# Patient Record
Sex: Male | Born: 1935
Health system: Southern US, Community
[De-identification: ages and names within clinical notes are randomized; demographics above are authoritative.]

## PROBLEM LIST (undated history)

## (undated) DIAGNOSIS — T8859XA Other complications of anesthesia, initial encounter: Secondary | ICD-10-CM

## (undated) DIAGNOSIS — I2510 Atherosclerotic heart disease of native coronary artery without angina pectoris: Secondary | ICD-10-CM

## (undated) DIAGNOSIS — I509 Heart failure, unspecified: Secondary | ICD-10-CM

## (undated) DIAGNOSIS — J439 Emphysema, unspecified: Secondary | ICD-10-CM

## (undated) DIAGNOSIS — T4145XA Adverse effect of unspecified anesthetic, initial encounter: Secondary | ICD-10-CM

## (undated) DIAGNOSIS — E209 Hypoparathyroidism, unspecified: Secondary | ICD-10-CM

## (undated) DIAGNOSIS — N400 Enlarged prostate without lower urinary tract symptoms: Secondary | ICD-10-CM

## (undated) DIAGNOSIS — I724 Aneurysm of artery of lower extremity: Secondary | ICD-10-CM

## (undated) DIAGNOSIS — I10 Essential (primary) hypertension: Secondary | ICD-10-CM

## (undated) DIAGNOSIS — I472 Ventricular tachycardia: Secondary | ICD-10-CM

## (undated) DIAGNOSIS — E785 Hyperlipidemia, unspecified: Secondary | ICD-10-CM

## (undated) DIAGNOSIS — J189 Pneumonia, unspecified organism: Secondary | ICD-10-CM

## (undated) DIAGNOSIS — M5430 Sciatica, unspecified side: Secondary | ICD-10-CM

## (undated) DIAGNOSIS — Z9989 Dependence on other enabling machines and devices: Secondary | ICD-10-CM

## (undated) DIAGNOSIS — G4733 Obstructive sleep apnea (adult) (pediatric): Secondary | ICD-10-CM

## (undated) DIAGNOSIS — I4729 Other ventricular tachycardia: Secondary | ICD-10-CM

## (undated) DIAGNOSIS — I219 Acute myocardial infarction, unspecified: Secondary | ICD-10-CM

## (undated) HISTORY — DX: Heart failure, unspecified: I50.9

## (undated) HISTORY — DX: Hyperlipidemia, unspecified: E78.5

## (undated) HISTORY — DX: Sciatica, unspecified side: M54.30

## (undated) HISTORY — DX: Acute myocardial infarction, unspecified: I21.9

## (undated) HISTORY — DX: Hypoparathyroidism, unspecified: E20.9

## (undated) HISTORY — PX: COLONOSCOPY: SHX174

## (undated) HISTORY — DX: Benign prostatic hyperplasia without lower urinary tract symptoms: N40.0

## (undated) HISTORY — PX: CATARACT EXTRACTION, BILATERAL: SHX1313

---

## 1992-02-08 DIAGNOSIS — I219 Acute myocardial infarction, unspecified: Secondary | ICD-10-CM

## 1992-02-08 HISTORY — DX: Acute myocardial infarction, unspecified: I21.9

## 1992-03-23 HISTORY — PX: CORONARY ARTERY BYPASS GRAFT: SHX141

## 2006-11-14 ENCOUNTER — Ambulatory Visit: Payer: Self-pay | Admitting: Family Medicine

## 2006-11-14 DIAGNOSIS — I10 Essential (primary) hypertension: Secondary | ICD-10-CM

## 2006-11-14 DIAGNOSIS — E785 Hyperlipidemia, unspecified: Secondary | ICD-10-CM

## 2006-11-15 ENCOUNTER — Encounter: Payer: Self-pay | Admitting: Family Medicine

## 2006-11-15 LAB — CONVERTED CEMR LAB
ALT: 23 units/L (ref 0–53)
AST: 21 units/L (ref 0–37)
Alkaline Phosphatase: 43 units/L (ref 39–117)
Calcium: 9.6 mg/dL (ref 8.4–10.5)
Chloride: 114 meq/L — ABNORMAL HIGH (ref 96–112)
Cholesterol, target level: 200 mg/dL
Cholesterol: 141 mg/dL (ref 0–200)
HDL goal, serum: 40 mg/dL
HDL: 39 mg/dL — ABNORMAL LOW (ref >39)
Lymphocytes Relative: 25 % (ref 12–46)
MCHC: 32.6 g/dL (ref 30.0–36.0)
MCV: 90.6 fL (ref 78.0–100.0)
Neutro Abs: 5.4 10*3/uL (ref 1.7–7.7)
Sodium: 142 meq/L (ref 135–145)
TSH: 1.534 microintl units/mL (ref 0.350–5.50)
Total CHOL/HDL Ratio: 3.6
Vitamin B-12: 279 pg/mL (ref 211–911)
WBC: 8.2 10*3/uL (ref 4.0–10.5)

## 2007-01-08 ENCOUNTER — Encounter: Payer: Self-pay | Admitting: Family Medicine

## 2007-01-25 ENCOUNTER — Encounter: Payer: Self-pay | Admitting: Family Medicine

## 2007-03-19 ENCOUNTER — Ambulatory Visit: Payer: Self-pay | Admitting: Family Medicine

## 2007-03-20 LAB — CONVERTED CEMR LAB
AST: 19 units/L (ref 0–37)
BUN: 14 mg/dL (ref 6–23)
CO2: 24 meq/L (ref 19–32)
Calcium: 9.6 mg/dL (ref 8.4–10.5)
Chloride: 108 meq/L (ref 96–112)
Cholesterol: 121 mg/dL (ref 0–200)
LDL Cholesterol: 59 mg/dL (ref 0–99)
Potassium: 4.7 meq/L (ref 3.5–5.3)
Sodium: 142 meq/L (ref 135–145)
Total Bilirubin: 0.6 mg/dL (ref 0.3–1.2)
Total CHOL/HDL Ratio: 2.8
VLDL: 18 mg/dL (ref 0–40)

## 2007-04-16 ENCOUNTER — Ambulatory Visit: Payer: Self-pay | Admitting: Family Medicine

## 2007-04-16 DIAGNOSIS — E348 Other specified endocrine disorders: Secondary | ICD-10-CM | POA: Insufficient documentation

## 2007-05-01 ENCOUNTER — Telehealth: Payer: Self-pay | Admitting: Family Medicine

## 2007-07-16 ENCOUNTER — Ambulatory Visit: Payer: Self-pay | Admitting: Family Medicine

## 2007-10-08 ENCOUNTER — Encounter: Payer: Self-pay | Admitting: Family Medicine

## 2007-10-09 LAB — CONVERTED CEMR LAB
Albumin: 4.4 g/dL (ref 3.5–5.2)
Alkaline Phosphatase: 30 units/L — ABNORMAL LOW (ref 39–117)
Glucose, Bld: 110 mg/dL — ABNORMAL HIGH (ref 70–99)
Sodium: 141 meq/L (ref 135–145)
Total Protein: 6.6 g/dL (ref 6.0–8.3)

## 2007-11-07 ENCOUNTER — Ambulatory Visit: Payer: Self-pay | Admitting: Family Medicine

## 2007-12-04 ENCOUNTER — Ambulatory Visit: Payer: Self-pay | Admitting: Family Medicine

## 2007-12-17 ENCOUNTER — Telehealth: Payer: Self-pay | Admitting: Family Medicine

## 2008-01-14 ENCOUNTER — Encounter: Payer: Self-pay | Admitting: Family Medicine

## 2008-01-14 ENCOUNTER — Ambulatory Visit: Payer: Self-pay | Admitting: Family Medicine

## 2008-01-14 LAB — CONVERTED CEMR LAB: Hgb A1c MFr Bld: 5.7 %

## 2008-01-15 LAB — CONVERTED CEMR LAB
Cholesterol: 125 mg/dL (ref 0–200)
HDL: 36 mg/dL — ABNORMAL LOW (ref >39)
Triglycerides: 131 mg/dL (ref <150)
VLDL: 26 mg/dL (ref 0–40)

## 2008-04-07 ENCOUNTER — Ambulatory Visit: Payer: Self-pay | Admitting: Family Medicine

## 2008-05-14 ENCOUNTER — Telehealth: Payer: Self-pay | Admitting: Family Medicine

## 2008-10-08 ENCOUNTER — Ambulatory Visit: Payer: Self-pay | Admitting: Family Medicine

## 2008-10-09 LAB — CONVERTED CEMR LAB
ALT: 23 units/L (ref 0–53)
AST: 24 units/L (ref 0–37)
Albumin: 4.4 g/dL (ref 3.5–5.2)
CO2: 26 meq/L (ref 19–32)
Glucose, Bld: 111 mg/dL — ABNORMAL HIGH (ref 70–99)
Potassium: 4.2 meq/L (ref 3.5–5.3)
Sodium: 142 meq/L (ref 135–145)
Total Bilirubin: 0.5 mg/dL (ref 0.3–1.2)

## 2008-11-27 ENCOUNTER — Ambulatory Visit (HOSPITAL_COMMUNITY): Admission: RE | Admit: 2008-11-27 | Discharge: 2008-11-28 | Payer: Self-pay | Admitting: Ophthalmology

## 2009-01-07 ENCOUNTER — Ambulatory Visit: Payer: Self-pay | Admitting: Family Medicine

## 2009-01-08 LAB — CONVERTED CEMR LAB
ALT: 23 units/L (ref 0–53)
AST: 24 units/L (ref 0–37)
BUN: 24 mg/dL — ABNORMAL HIGH (ref 6–23)
HDL: 36 mg/dL — ABNORMAL LOW (ref >39)
LDL Cholesterol: 75 mg/dL (ref 0–99)
Total Bilirubin: 0.5 mg/dL (ref 0.3–1.2)
Total CHOL/HDL Ratio: 4.2
Total Protein: 6.7 g/dL (ref 6.0–8.3)
VLDL: 40 mg/dL (ref 0–40)

## 2009-01-09 ENCOUNTER — Encounter: Payer: Self-pay | Admitting: Family Medicine

## 2009-03-09 ENCOUNTER — Encounter: Payer: Self-pay | Admitting: Family Medicine

## 2009-04-27 ENCOUNTER — Encounter: Payer: Self-pay | Admitting: Family Medicine

## 2009-05-07 ENCOUNTER — Ambulatory Visit: Payer: Self-pay | Admitting: Family Medicine

## 2009-05-08 LAB — CONVERTED CEMR LAB
AST: 17 units/L (ref 0–37)
Albumin: 4.4 g/dL (ref 3.5–5.2)
BUN: 24 mg/dL — ABNORMAL HIGH (ref 6–23)
CO2: 27 meq/L (ref 19–32)
Calcium: 9.9 mg/dL (ref 8.4–10.5)
Glucose, Bld: 113 mg/dL — ABNORMAL HIGH (ref 70–99)
PSA: 3.47 ng/mL (ref 0.10–4.00)

## 2009-10-29 ENCOUNTER — Telehealth (INDEPENDENT_AMBULATORY_CARE_PROVIDER_SITE_OTHER): Payer: Self-pay | Admitting: *Deleted

## 2009-11-02 ENCOUNTER — Telehealth: Payer: Self-pay | Admitting: Family Medicine

## 2009-11-10 ENCOUNTER — Ambulatory Visit: Payer: Self-pay | Admitting: Family Medicine

## 2009-11-10 DIAGNOSIS — M5430 Sciatica, unspecified side: Secondary | ICD-10-CM

## 2009-11-10 LAB — CONVERTED CEMR LAB
Blood Glucose, AC Bkfst: 97 mg/dL
Triglycerides: 206 mg/dL — ABNORMAL HIGH (ref <150)

## 2009-12-11 ENCOUNTER — Ambulatory Visit: Payer: Self-pay | Admitting: Family Medicine

## 2010-02-07 DIAGNOSIS — M5430 Sciatica, unspecified side: Secondary | ICD-10-CM

## 2010-02-07 HISTORY — DX: Sciatica, unspecified side: M54.30

## 2010-02-28 ENCOUNTER — Encounter: Payer: Self-pay | Admitting: Family Medicine

## 2010-03-09 NOTE — Assessment & Plan Note (Signed)
Summary: Sinusitis, HTN   Vital Signs:  Patient profile:   77 year old male Height:      68.75 inches Weight:      226 pounds Pulse rate:   61 / minute BP sitting:   138 / 81  (left arm) Cuff size:   regular  Vitals Entered By: Kathlene November (May 07, 2009 8:32 AM) CC: follow-up hypertension, Hypertension Management   Primary Care Provider:  Linford Arnold, C  CC:  follow-up hypertension and Hypertension Management.  History of Present Illness: follow-up hypertension.    Sinus congetion for about 1.5 months. Persistant bilat HA with lots of pressure.  Has tired several OTC products adn none have improved his sxs.  Has tried allergy meds. No fever.  Cough at night when lays flat and gets the drainage. No GI sxs.  When bends over nose is runny.   Hypertension History:      He complains of headache, but denies chest pain, palpitations, dyspnea with exertion, orthopnea, PND, peripheral edema, visual symptoms, neurologic problems, syncope, and side effects from treatment.  He notes no problems with any antihypertensive medication side effects.        Positive major cardiovascular risk factors include male age 63 years old or older, hyperlipidemia, hypertension, family history for ischemic heart disease (males less than 32 years old), and current tobacco user.  Negative major cardiovascular risk factors include no history of diabetes.        Positive history for target organ damage include ASHD (either angina/prior MI/prior CABG).  Further assessment for target organ damage reveals no history of stroke/TIA or peripheral vascular disease.     Current Medications (verified): 1)  Lipitor 40 Mg  Tabs (Atorvastatin Calcium) .... Take 1 Tablet By Mouth Once A Day At Bedtime 2)  Losartan Potassium-Hctz 100-25 Mg Tabs (Losartan Potassium-Hctz) .... Take 1 Tablet By Mouth Once A Day 3)  Tricor 145 Mg  Tabs (Fenofibrate) .... Take 1 Tablet By Mouth Once A Day 4)  Aspir-81 81 Mg  Tbec (Aspirin) .... Take  1 Tablet By Mouth Once A Day 5)  Metoprolol Tartrate 100 Mg Tabs (Metoprolol Tartrate) .... Take 1 Tablet By Mouth Two Times A Day  Allergies (verified): No Known Drug Allergies  Comments:  Nurse/Medical Assistant: The patient's medications and allergies were reviewed with the patient and were updated in the Medication and Allergy Lists. Kathlene November (May 07, 2009 8:32 AM)  Physical Exam  General:  Well-developed,well-nourished,in no acute distress; alert,appropriate and cooperative throughout examination Head:  Normocephalic and atraumatic without obvious abnormalities. No apparent alopecia or balding. TEnder over maxillary sinuses.  Eyes:  No corneal or conjunctival inflammation noted. EOMI. Perrla.  Ears:  External ear exam shows no significant lesions or deformities.  Otoscopic examination reveals clear canals, tympanic membranes are intact bilaterally without bulging, retraction, inflammation or discharge. Hearing is grossly normal bilaterally. Nose:  External nasal examination shows no deformity or inflammation.  Mouth:  Oral mucosa and oropharynx without lesions or exudates.  Teeth in good repair. Neck:  No deformities, masses, or tenderness noted. Lungs:  Normal respiratory effort, chest expands symmetrically. Lungs are clear to auscultation, no crackles or wheezes. Heart:  Normal rate and regular rhythm. S1 and S2 normal without gallop, murmur, click, rub or other extra sounds. Pulses:  Radial 2+  Skin:  no rashes.   Cervical Nodes:  No lymphadenopathy noted Psych:  Cognition and judgment appear intact. Alert and cooperative with normal attention span and concentration. No apparent delusions,  illusions, hallucinations   Impression & Recommendations:  Problem # 1:  HYPERTENSION, BENIGN ESSENTIAL (ICD-401.1) Looks better today and hasn't even taken his meds yet, since was fasting. F/U in 6 months. Due to recheck labs.   His updated medication list for this problem includes:     Losartan Potassium-hctz 100-25 Mg Tabs (Losartan potassium-hctz) .[name]... Take 1 tablet by mouth once a day    Metoprolol Tartrate 100 Mg Tabs (Metoprolol tartrate) .[name]... Take 1 tablet by mouth two times a day  BP today: 138/81 Prior BP: 143/74 (01/07/2009)  Prior 10 Yr Risk Heart Disease: N/A (11/15/2006)  Labs Reviewed: K+: 4.4 (01/07/2009) Creat: : 1.17 (01/07/2009)   Chol: 151 (01/07/2009)   HDL: 36 (01/07/2009)   LDL: 75 (01/07/2009)   TG: 202 (01/07/2009)  Orders: T-Comprehensive Metabolic Panel ([id number])  Problem # 2:  SINUSITIS - ACUTE-NOS (ICD-461.9)  His updated medication list for this problem includes:    Amoxicillin 500 Mg Cap (Amoxicillin) .[name]... Take 1 capsule by mouth three times a day x 10 days  Instructed on treatment. Call if symptoms persist or worsen.   Problem # 3:  SPECIAL SCREENING MALIGNANT NEOPLASM OF PROSTATE (ICD-V76.44) Will recheck PSA as was elevated in December but came back down a little in Jan.   Also due for DEXA.  Orders: T-PSA ([id number])  Complete Medication List: 1)  Lipitor 40 Mg Tabs (Atorvastatin calcium) .... Take 1 tablet by mouth once a day at bedtime 2)  Losartan Potassium-hctz 100-25 Mg Tabs (Losartan potassium-hctz) .... Take 1 tablet by mouth once a day 3)  Tricor 145 Mg Tabs (Fenofibrate) .... Take 1 tablet by mouth once a day 4)  Aspir-81 81 Mg Tbec (Aspirin) .... Take 1 tablet by mouth once a day 5)  Metoprolol Tartrate 100 Mg Tabs (Metoprolol tartrate) .... Take 1 tablet by mouth two times a day 6)  Amoxicillin 500 Mg Cap (Amoxicillin) .... Take 1 capsule by mouth three times a day x 10 days  Other Orders: T-Dual DXA Bone Density/ Axial (29562) T-Triglycerides ([id number])  Hypertension Assessment/Plan:      The patient's hypertensive risk group is category C: Target organ damage and/or diabetes.  Today's blood pressure is 138/81.  His blood pressure goal is < 140/90.  Patient Instructions: 1)  Please schedule a  follow-up appointment in 6 months .  Prescriptions: AMOXICILLIN 500 MG CAP (AMOXICILLIN) Take 1 capsule by mouth three times a day X 10 days  #30 x 0   Entered and Authorized by:   Nani Gasser MD   Signed by:   Nani Gasser MD on 05/07/2009   Method used:   Electronically to        [facility] [phone]* (retail)       [address]       [place], [place]  [address]       Ph: [phone]       Fax: [phone]   RxID:   [id number] LOSARTAN POTASSIUM-HCTZ 100-25 MG TABS (LOSARTAN POTASSIUM-HCTZ) Take 1 tablet by mouth once a day  #90 x 2   Entered and Authorized by:   Nani Gasser MD   Signed by:   Nani Gasser MD on 05/07/2009   Method used:   Electronically to        [facility] [phone]* (retail)       [address]       [place], [place]  [address]       Ph: [phone]  Fax: [phone]   RxID:   [id number]

## 2010-03-09 NOTE — Progress Notes (Signed)
Summary: diagnosis  Phone Note From Other Clinic   Caller: GIK Call For: Dr. Linford Arnold Summary of Call: need a specific dx for bone density scan. Insurance will not take the one given.Does he have a hx of fx Initial call taken by: Avon Gully CMA, Duncan Dull),  November 02, 2009 2:39 PM  Follow-up for Phone Call        Ordered becuase note from his insrance said he needed one.  Follow-up by: Nani Gasser MD,  November 02, 2009 4:56 PM

## 2010-03-09 NOTE — Assessment & Plan Note (Signed)
Summary: 1 Mo. BP check - jr  Nurse Visit   Vital Signs:  Patient profile:   77 year old male Pulse rate:   72 / minute BP sitting:   129 / 67  (left arm) Cuff size:   large  Vitals Entered By: Kathlene November LPN (December 11, 2009 10:01 AM) CC: BP check CC: Follow-up HTN HPI: Taking Meds?yes Side Effects?no Chest Pain, SOB, Dizziness?no A/P: HTN(401.1) At Goal? If no, MD will be notified. Follow-up in--  5 minutes was spent with the pt.   Allergies: No Known Drug Allergies  Orders Added: 1)  Est. Patient Level I [[address]]    Impression & Recommendations:  Problem # 1:  HYPERTENSION, BENIGN ESSENTIAL (ICD-401.1) BP/ HR look great.  Continue current meds.   His updated medication list for this problem includes:    Losartan Potassium-hctz 100-25 Mg Tabs (Losartan potassium-hctz) .[name]... Take 1 tablet by mouth once a day    Metoprolol Tartrate 100 Mg Tabs (Metoprolol tartrate) .[name]... Take 1 tablet by mouth two times a day  BP today: 129/67 Prior BP: 156/83 (11/10/2009)  Prior 10 Yr Risk Heart Disease: N/A (11/15/2006)  Labs Reviewed: K+: 4.3 (05/07/2009) Creat: : 1.03 (05/07/2009)   Chol: 151 (01/07/2009)   HDL: 36 (01/07/2009)   LDL: 75 (01/07/2009)   TG: 206 (11/10/2009)  Complete Medication List: 1)  Lipitor 40 Mg Tabs (Atorvastatin calcium) .... Take 1 tablet by mouth once a day at bedtime 2)  Losartan Potassium-hctz 100-25 Mg Tabs (Losartan potassium-hctz) .... Take 1 tablet by mouth once a day 3)  Tricor 145 Mg Tabs (Fenofibrate) .... Take 1 tablet by mouth once a day 4)  Aspir-81 81 Mg Tbec (Aspirin) .... Take 1 tablet by mouth once a day 5)  Metoprolol Tartrate 100 Mg Tabs (Metoprolol tartrate) .... Take 1 tablet by mouth two times a day

## 2010-03-09 NOTE — Assessment & Plan Note (Signed)
Summary: 6 MONTH FU HTN, Sciatica   Vital Signs:  Patient profile:   77 year old male Height:      68.75 inches Weight:      221 pounds Pulse rate:   59 / minute BP sitting:   156 / 83  (right arm) Cuff size:   large  Vitals Entered By: Avon Gully CMA, Duncan Dull) (November 10, 2009 8:55 AM)  Serial Vital Signs/Assessments:  Time      Position  BP       Pulse  Resp  Temp     By 10:26 AM            144/73                         Avon Gully CMA, (AAMA)  CC: f/u BP pt just took meds 30 min ago   Primary Care Provider:  Linford Arnold, C  CC:  f/u BP pt just took meds 30 min ago.  History of Present Illness: f/u BP pt just took meds 30 min ago.  Right hip pain  about a month. Larey Seat down a flight of steps at work because the hip was weak. Says skinned his left knee and right elbow and hurt his neck. went to a chiropracter and his neck is much better. Hip  Painful to walk. No stiffness. Pain in the buttock area. Then runs the outside o his leg. No low back pain. No pain in the groin crease. Using Advil and that does help.  Says had to quit his job due to the pain.   Current Medications (verified): 1)  Lipitor 40 Mg  Tabs (Atorvastatin Calcium) .... Take 1 Tablet By Mouth Once A Day At Bedtime 2)  Losartan Potassium-Hctz 100-25 Mg Tabs (Losartan Potassium-Hctz) .... Take 1 Tablet By Mouth Once A Day 3)  Tricor 145 Mg  Tabs (Fenofibrate) .... Take 1 Tablet By Mouth Once A Day 4)  Aspir-81 81 Mg  Tbec (Aspirin) .... Take 1 Tablet By Mouth Once A Day 5)  Metoprolol Tartrate 100 Mg Tabs (Metoprolol Tartrate) .... Take 1 Tablet By Mouth Two Times A Day  Allergies (verified): No Known Drug Allergies  Comments:  Nurse/Medical Assistant: The patient's medications and allergies were reviewed with the patient and were updated in the Medication and Allergy Lists. Avon Gully CMA, Duncan Dull) (November 10, 2009 8:56 AM)  Physical Exam  General:  Well-developed,well-nourished,in no acute  distress; alert,appropriate and cooperative throughout examination Head:  Normocephalic and atraumatic without obvious abnormalities. No apparent alopecia or balding. Lungs:  Normal respiratory effort, chest expands symmetrically. Lungs are clear to auscultation, no crackles or wheezes. Heart:  Normal rate and regular rhythm. S1 and S2 normal without gallop, murmur, click, rub or other extra sounds. Msk:  Right hip with NROM, strength 5/5. Neg straigt leg raise. Very tender and feel a knot over the buttock area . Nontender over the greater trochanter.  Nontender lumbar or sacral spine.   Neurologic:  Patellar 2+ bilat.    Impression & Recommendations:  Problem # 1:  HYPERTENSION, BENIGN ESSENTIAL (ICD-401.1) BP is still high today. I really want to adjust his medication but he really doens't want me too. He feels it is due to stress. Discussed to recheck in one month to see if better controlled.  His updated medication list for this problem includes:    Losartan Potassium-hctz 100-25 Mg Tabs (Losartan potassium-hctz) .[name]... Take 1 tablet by mouth once a  day    Metoprolol Tartrate 100 Mg Tabs (Metoprolol tartrate) .[name]... Take 1 tablet by mouth two times a day  BP today: 156/83 Prior BP: 138/81 (05/07/2009)  Prior 10 Yr Risk Heart Disease: N/A (11/15/2006)  Labs Reviewed: K+: 4.3 (05/07/2009) Creat: : 1.03 (05/07/2009)   Chol: 151 (01/07/2009)   HDL: 36 (01/07/2009)   LDL: 75 (01/07/2009)   TG: 193 (05/07/2009)  Problem # 2:  SCIATICA (ICD-724.3) Dsicussed based on history That I think he has sciatica.  I gave him a H.O on piriformis syndrom which is where I think his symptoms are orginiating.  Also recommend Advil three times a day with food. Stop if any GI irritation.  Also he would benefit from formal PT since has had pain for > 1 month. He will check withhis insruacne company.  His updated medication list for this problem includes:    Aspir-81 81 Mg Tbec (Aspirin) .[name]... Take 1 tablet by  mouth once a day  Problem # 3:  HYPERLIPIDEMIA NEC/NOS (ICD-272.4) Due tor recheck TG.  His updated medication list for this problem includes:    Lipitor 40 Mg Tabs (Atorvastatin calcium) .[name]... Take 1 tablet by mouth once a day at bedtime    Tricor 145 Mg Tabs (Fenofibrate) .[name]... Take 1 tablet by mouth once a day  Orders: T-Triglycerides ([id number])  Problem # 4:  INSULIN RESISTANCE SYNDROME (ICD-259.8) Repeat glucose looks great today!   Orders: Fingerstick (36416) Glucose, (CBG) (16010)  Complete Medication List: 1)  Lipitor 40 Mg Tabs (Atorvastatin calcium) .... Take 1 tablet by mouth once a day at bedtime 2)  Losartan Potassium-hctz 100-25 Mg Tabs (Losartan potassium-hctz) .... Take 1 tablet by mouth once a day 3)  Tricor 145 Mg Tabs (Fenofibrate) .... Take 1 tablet by mouth once a day 4)  Aspir-81 81 Mg Tbec (Aspirin) .... Take 1 tablet by mouth once a day 5)  Metoprolol Tartrate 100 Mg Tabs (Metoprolol tartrate) .... Take 1 tablet by mouth two times a day  Other Orders: Flu Vaccine 76yrs + (93235) Admin 1st Vaccine (57322)  Patient Instructions: 1)  Follow up in 1 month for nurse Blood pressure check.  2)  Can start the exercises on the handout. Use your Advil three times a day with food. Stop if any stomach irritation and call you insurance to find out what coverage they have for Physical Therapy. 3)  Follow up in 3 weeks if your hip is not better.    Immunizations Administered:  Influenza Vaccine # 1:    Vaccine Type: Fluvax 3+    Site: right deltoid    Mfr: [employer]oSmith[employer]    Dose: 0.5 ml    Route: IM    Given by: Sue Lush McCrimmon CMA, (AAMA)    Exp. Date: 08/07/2010    Lot #: [id number]    VIS given: 09/01/09 version given November 10, 2009.  Flu Vaccine Consent Questions:    Do you have a history of severe allergic reactions to this vaccine? no    Any prior history of allergic reactions to egg and/or gelatin? no    Do you have a sensitivity to the  preservative Thimersol? no    Do you have a past history of Guillan-Barre Syndrome? no    Do you currently have an acute febrile illness? no    Have you ever had a severe reaction to latex? no    Vaccine information given and explained to patient? no  Laboratory Results   Blood Tests  Date/Time Received: 11/10/09 Date/Time Reported: 11/10/09  CBG Fasting:: 97mg /dL

## 2010-03-09 NOTE — Progress Notes (Signed)
Summary: bone density  Phone Note Call from Patient Call back at Home Phone [phone]   Caller: Patient Call For: Sue Lush Summary of Call: Pt needs bone density scheduled. He tried to do it himself for downstairs but they told him you had to call to schedule Initial call taken by: Kathlene November,  October 29, 2009 11:05 AM  Follow-up for Phone Call        done Follow-up by: Kathlene November,  October 30, 2009 8:21 AM

## 2010-03-09 NOTE — Letter (Signed)
Summary: Letter Regarding Osteoporosis Screening/[employer]  Letter Regarding Osteoporosis Screening/[employer]   Imported By: Lanelle Bal 05/13/2009 13:45:06  _____________________________________________________________________  External Attachment:    Type:   Image     Comment:   External Document

## 2010-03-15 ENCOUNTER — Ambulatory Visit: Payer: Self-pay | Admitting: Family Medicine

## 2010-03-22 ENCOUNTER — Encounter: Payer: Self-pay | Admitting: Family Medicine

## 2010-03-22 ENCOUNTER — Ambulatory Visit (INDEPENDENT_AMBULATORY_CARE_PROVIDER_SITE_OTHER): Payer: Medicare PPO | Admitting: Family Medicine

## 2010-03-22 DIAGNOSIS — I10 Essential (primary) hypertension: Secondary | ICD-10-CM

## 2010-03-22 DIAGNOSIS — E785 Hyperlipidemia, unspecified: Secondary | ICD-10-CM

## 2010-03-22 DIAGNOSIS — J019 Acute sinusitis, unspecified: Secondary | ICD-10-CM

## 2010-03-24 LAB — CONVERTED CEMR LAB
ALT: 19 units/L (ref 0–53)
AST: 20 units/L (ref 0–37)
CO2: 28 meq/L (ref 19–32)
Calcium: 10.6 mg/dL — ABNORMAL HIGH (ref 8.4–10.5)
Cholesterol: 152 mg/dL (ref 0–200)
Creatinine, Ser: 1.31 mg/dL (ref 0.40–1.50)
HDL: 38 mg/dL — ABNORMAL LOW (ref >39)
LDL Cholesterol: 71 mg/dL (ref 0–99)
Total Bilirubin: 0.8 mg/dL (ref 0.3–1.2)
Triglycerides: 217 mg/dL — ABNORMAL HIGH (ref <150)

## 2010-03-29 ENCOUNTER — Telehealth (INDEPENDENT_AMBULATORY_CARE_PROVIDER_SITE_OTHER): Payer: Self-pay | Admitting: *Deleted

## 2010-03-31 NOTE — Assessment & Plan Note (Signed)
Summary: HTN, Lipids. Sinusitis   Vital Signs:  Patient profile:   77 year old male Height:      68.75 inches Weight:      215 pounds Pulse rate:   83 / minute BP sitting:   133 / 77  (right arm) Cuff size:   large  Vitals Entered By: Avon Gully CMA, (AAMA) (March 22, 2010 10:07 AM) CC: f/u BP, sinus drainage   Primary Care Provider:  Linford Arnold, C  CC:  f/u BP and sinus drainage.  History of Present Illness: Runny nose, Left maxillary sinus congesation adn pressure.  left eye is matted and left ear is  plugged.  No fever. Tried benadryl but not helping.  Started 8 days ago.   Current Medications (verified): 1)  Lipitor 40 Mg  Tabs (Atorvastatin Calcium) .... Take 1 Tablet By Mouth Once A Day At Bedtime 2)  Losartan Potassium-Hctz 100-25 Mg Tabs (Losartan Potassium-Hctz) .... Take 1 Tablet By Mouth Once A Day 3)  Tricor 145 Mg  Tabs (Fenofibrate) .... Take 1 Tablet By Mouth Once A Day 4)  Aspir-81 81 Mg  Tbec (Aspirin) .... Take 1 Tablet By Mouth Once A Day 5)  Metoprolol Tartrate 100 Mg Tabs (Metoprolol Tartrate) .... Take 1 Tablet By Mouth Two Times A Day  Allergies (verified): No Known Drug Allergies  Comments:  Nurse/Medical Assistant: The patient's medications and allergies were reviewed with the patient and were updated in the Medication and Allergy Lists. Avon Gully CMA, Duncan Dull) (March 22, 2010 10:08 AM)  Physical Exam  General:  Well-developed,well-nourished,in no acute distress; alert,appropriate and cooperative throughout examination Head:  Normocephalic and atraumatic without obvious abnormalities. No apparent alopecia or balding. Eyes:  No corneal or conjunctival inflammation noted. EOMI. Perrla.  Ears:  External ear exam shows no significant lesions or deformities.  Otoscopic examination reveals clear canals, tympanic membranes are intact bilaterally without bulging, retraction, inflammation or discharge. Hearing is grossly normal  bilaterally. Nose:  External nasal examination shows no deformity or inflammation.  Mouth:  Oral mucosa and oropharynx without lesions or exudates.  Teeth in good repair. Neck:  No deformities, masses, or tenderness noted. Lungs:  Normal respiratory effort, chest expands symmetrically. Lungs are clear to auscultation, no crackles or wheezes. Heart:  Normal rate and regular rhythm. S1 and S2 normal without gallop, murmur, click, rub or other extra sounds. Skin:  no rashes.   Cervical Nodes:  No lymphadenopathy noted Psych:  Cognition and judgment appear intact. Alert and cooperative with normal attention span and concentration. No apparent delusions, illusions, hallucinations   Impression & Recommendations:  Problem # 1:  SINUSITIS - ACUTE-NOS (ICD-461.9)  His updated medication list for this problem includes:    Amoxicillin 875 Mg Tabs (Amoxicillin) .[name]... Take 1 tablet by mouth two times a day  for 10 days  Problem # 2:  HYPERTENSION, BENIGN ESSENTIAL (ICD-401.1) Looks great. Due for CMP/. His updated medication list for this problem includes:    Losartan Potassium-hctz 100-25 Mg Tabs (Losartan potassium-hctz) .[name]... Take 1 tablet by mouth once a day    Metoprolol Tartrate 100 Mg Tabs (Metoprolol tartrate) .[name]... Take 1 tablet by mouth two times a day  Problem # 3:  HYPERLIPIDEMIA NEC/NOS (ICD-272.4) Due to recheck and make sure at [facility]. He has done a fantastic job and has lost 6 lbs.  His updated medication list for this problem includes:    Lipitor 40 Mg Tabs (Atorvastatin calcium) .[name]... Take 1 tablet by mouth once a day at  bedtime    Tricor 145 Mg Tabs (Fenofibrate) .[name]... Take 1 tablet by mouth once a day  Orders: T-Lipid Profile ([id number]) T-Comprehensive Metabolic Panel ([id number])  Complete Medication List: 1)  Lipitor 40 Mg Tabs (Atorvastatin calcium) .... Take 1 tablet by mouth once a day at bedtime 2)  Losartan Potassium-hctz 100-25 Mg Tabs (Losartan potassium-hctz)  .... Take 1 tablet by mouth once a day 3)  Tricor 145 Mg Tabs (Fenofibrate) .... Take 1 tablet by mouth once a day 4)  Aspir-81 81 Mg Tbec (Aspirin) .... Take 1 tablet by mouth once a day 5)  Metoprolol Tartrate 100 Mg Tabs (Metoprolol tartrate) .... Take 1 tablet by mouth two times a day 6)  Amoxicillin 875 Mg Tabs (Amoxicillin) .... Take 1 tablet by mouth two times a day  for 10 days  Patient Instructions: 1)  Call if not better in one week.   Prescriptions: LIPITOR 40 MG  TABS (ATORVASTATIN CALCIUM) Take 1 tablet by mouth once a day at bedtime  #90 x 3   Entered and Authorized by:   Nani Gasser MD   Signed by:   Nani Gasser MD on 03/22/2010   Method used:   Electronically to        [facility] [phone]* (retail)       [address]       [place], [place]  [address]       Ph: [phone]       Fax: [phone]   RxID:   [id number] AMOXICILLIN 875 MG TABS (AMOXICILLIN) Take 1 tablet by mouth two times a day  for 10 days  #20 x 0   Entered and Authorized by:   Nani Gasser MD   Signed by:   Nani Gasser MD on 03/22/2010   Method used:   Electronically to        [facility] [phone]* (retail)       [address]       [place], [place]  [address]       Ph: [phone]       Fax: [phone]   RxID:   [id number]    Orders Added: 1)  T-Lipid Profile [phone] 2)  T-Comprehensive Metabolic Panel [[id number]] 3)  Est. Patient Level III [73710]

## 2010-04-06 NOTE — Progress Notes (Signed)
Summary: Sinusitis no better  Phone Note Call from Patient   Caller: Patient Summary of Call: Pt LMOM stating that he completed AMOX for sinusitis but is still not feeling any better. Pt states he has all of the same Sxs. Please advise. Initial call taken by: Payton Spark CMA,  March 29, 2010 1:58 PM  Follow-up for Phone Call        OK will change ABX. If not better in one week needs OV.  Follow-up by: Nani Gasser MD,  March 29, 2010 2:03 PM  Additional Follow-up for Phone Call Additional follow up Details #1::        Pt aware of the above Additional Follow-up by: Payton Spark CMA,  March 29, 2010 2:22 PM    New/Updated Medications: CEFDINIR 300 MG CAPS (CEFDINIR) 2 tab by mouth day for 10 days Prescriptions: CEFDINIR 300 MG CAPS (CEFDINIR) 2 tab by mouth day for 10 days  #20 x 0   Entered and Authorized by:   Nani Gasser MD   Signed by:   Nani Gasser MD on 03/29/2010   Method used:   Electronically to        [facility] [phone]* (retail)       [address]       [place], [place]  [address]       Ph: [phone]       Fax: [phone]   RxID:   [id number]

## 2010-05-04 ENCOUNTER — Telehealth: Payer: Self-pay | Admitting: *Deleted

## 2010-05-04 DIAGNOSIS — N289 Disorder of kidney and ureter, unspecified: Secondary | ICD-10-CM

## 2010-05-04 NOTE — Telephone Encounter (Signed)
Pt needs lab orders faxed down to repeat BUN and Cr. Please order and I will fax.

## 2010-05-08 LAB — BUN: BUN: 31 mg/dL — ABNORMAL HIGH (ref 6–23)

## 2010-05-13 LAB — BASIC METABOLIC PANEL
BUN: 16 mg/dL (ref 6–23)
Potassium: 4.2 mEq/L (ref 3.5–5.1)

## 2010-05-13 LAB — CBC
MCHC: 34.4 g/dL (ref 30.0–36.0)
RBC: 4.92 MIL/uL (ref 4.22–5.81)
RDW: 13.1 % (ref 11.5–15.5)
WBC: 8.8 10*3/uL (ref 4.0–10.5)

## 2010-05-19 ENCOUNTER — Other Ambulatory Visit: Payer: Self-pay | Admitting: Family Medicine

## 2010-05-19 MED ORDER — LOSARTAN POTASSIUM-HCTZ 100-12.5 MG PO TABS: 1.0000 | ORAL_TABLET | Freq: Every day | ORAL | Status: DC

## 2010-05-19 NOTE — Telephone Encounter (Signed)
Still looks dry so will dec teh fluid pill in your BP med. Then recheck in 1 month. New rx sent to pharm.

## 2010-05-19 NOTE — Telephone Encounter (Signed)
Pt.notified

## 2010-05-21 ENCOUNTER — Telehealth: Payer: Self-pay | Admitting: Family Medicine

## 2010-05-21 NOTE — Telephone Encounter (Signed)
I called pt to verify that his old old dose is losartan 100/25 and it is. We definitely sent over the 100/12.5.  I will call the pharmacy.  [facility]ed [facility] and they said they received a new rx on 05-19-10. Pt verified the faxed rx and they did put it in incorrectly. He says he will fix this and call the pt back.

## 2010-05-21 NOTE — Telephone Encounter (Signed)
Patient's wife said that patient was supposed to be receiving a new medication, his recent Rx was for losartan which is something he has been on for awhile. Please contact patient's wife.

## 2010-07-22 ENCOUNTER — Telehealth: Payer: Self-pay | Admitting: Family Medicine

## 2010-07-22 DIAGNOSIS — E781 Pure hyperglyceridemia: Secondary | ICD-10-CM

## 2010-07-22 NOTE — Telephone Encounter (Signed)
Call pt: Due to recheck TG and calcium.

## 2010-07-22 NOTE — Telephone Encounter (Signed)
Advised pt. Will come in tomr to get BW done.

## 2010-07-26 ENCOUNTER — Telehealth: Payer: Self-pay | Admitting: Family Medicine

## 2010-07-26 ENCOUNTER — Other Ambulatory Visit: Payer: Self-pay | Admitting: Family Medicine

## 2010-07-26 LAB — PTH, INTACT AND CALCIUM: PTH: 7.6 pg/mL — ABNORMAL LOW (ref 14.0–72.0)

## 2010-07-26 NOTE — Telephone Encounter (Signed)
Advised pt of results.

## 2010-07-26 NOTE — Telephone Encounter (Signed)
Call patient: Triglycerides looked much better at 165. A metastatic goal of 150. Continue to work on getting some regular exercise. Great job and we will recheck cholesterol in one year.

## 2010-07-26 NOTE — Telephone Encounter (Signed)
Calcium was stable but his parathyroid level is low. Is he taking any calcium supplements. If so needs to stop them. If not then we will need to stop his hct in his hyzaar adn send over just losartan.  Then recheck calcium and PTH in about 2-3 weeks. I put new med in . Can you enter the pharm and hit sign

## 2010-07-29 ENCOUNTER — Encounter: Payer: Self-pay | Admitting: Family Medicine

## 2010-07-29 ENCOUNTER — Telehealth: Payer: Self-pay | Admitting: Family Medicine

## 2010-07-29 MED ORDER — LOSARTAN POTASSIUM 100 MG PO TABS: 100.0000 mg | ORAL_TABLET | Freq: Every day | ORAL | Status: DC

## 2010-07-30 ENCOUNTER — Ambulatory Visit
Admission: RE | Admit: 2010-07-30 | Discharge: 2010-07-30 | Disposition: A | Discharge status: Home / Self Care | Payer: BC Managed Care – PPO | Source: Ambulatory Visit | Attending: Family Medicine | Admitting: Family Medicine

## 2010-07-30 ENCOUNTER — Ambulatory Visit (INDEPENDENT_AMBULATORY_CARE_PROVIDER_SITE_OTHER): Payer: BC Managed Care – PPO | Admitting: Family Medicine

## 2010-07-30 ENCOUNTER — Telehealth: Payer: Self-pay | Admitting: Family Medicine

## 2010-07-30 ENCOUNTER — Encounter: Payer: Self-pay | Admitting: Family Medicine

## 2010-07-30 DIAGNOSIS — R300 Dysuria: Secondary | ICD-10-CM

## 2010-07-30 DIAGNOSIS — R109 Unspecified abdominal pain: Secondary | ICD-10-CM

## 2010-07-30 DIAGNOSIS — M25559 Pain in unspecified hip: Secondary | ICD-10-CM

## 2010-07-30 DIAGNOSIS — Z23 Encounter for immunization: Secondary | ICD-10-CM

## 2010-07-30 DIAGNOSIS — M25551 Pain in right hip: Secondary | ICD-10-CM

## 2010-07-30 DIAGNOSIS — E785 Hyperlipidemia, unspecified: Secondary | ICD-10-CM

## 2010-07-30 DIAGNOSIS — E209 Hypoparathyroidism, unspecified: Secondary | ICD-10-CM

## 2010-07-30 DIAGNOSIS — I10 Essential (primary) hypertension: Secondary | ICD-10-CM

## 2010-07-30 LAB — POCT URINALYSIS DIPSTICK
Blood, UA: NEGATIVE
Urobilinogen, UA: 1
pH, UA: 6.5

## 2010-07-30 MED ORDER — PNEUMOCOCCAL VAC POLYVALENT 25 MCG/0.5ML IJ INJ: 0.5000 mL | INJECTION | Freq: Once | INTRAMUSCULAR | Status: DC

## 2010-07-30 MED ORDER — LOSARTAN POTASSIUM-HCTZ 100-12.5 MG PO TABS: 1.0000 | ORAL_TABLET | Freq: Every day | ORAL | Status: DC

## 2010-07-30 NOTE — Telephone Encounter (Signed)
Pt aware of the above  

## 2010-07-30 NOTE — Assessment & Plan Note (Signed)
Discussed since not taking any supplemental calcium then I would like for him to see an endocrinologist to see why has hypercalcemia but has low PTH.

## 2010-07-30 NOTE — Progress Notes (Signed)
  Subjective:    Patient ID: Joseph Berg, male    DOB: 04/13/1933, 77 y.o.   MRN: [id number]  Hypertension This is a chronic problem. The current episode started more than 1 year ago. The problem is unchanged. The problem is uncontrolled. Pertinent negatives include no blurred vision, chest pain or shortness of breath. There are no associated agents to hypertension. Risk factors for coronary artery disease include no known risk factors. Past treatments include ACE inhibitors, diuretics and beta blockers. The current treatment provides mild improvement. There are no compliance problems.   We had to dec his diuretic because it was elevating his BUN and Cr.   Right hip pain- He is getting burning with walking but o/w says he has a NROM and no locking of the joint.  He does have a hx f sciatica  Low PTH - He is not taking any extra calcium.    He also complains of lower abdominal pain. From the umbilicus. It is a surgical days ago. No nausea vomiting or diarrhea. He's not sure if it's related to his back or not. He says his last colonoscopy was 3-4 years ago and was normal.    Review of Systems  Eyes: Negative for blurred vision.  Respiratory: Negative for shortness of breath.   Cardiovascular: Negative for chest pain.       Objective:   Physical Exam  Constitutional: He is oriented to person, place, and time. He appears well-developed and well-nourished.  HENT:  Head: Normocephalic and atraumatic.  Cardiovascular: Normal rate, regular rhythm and normal heart sounds.   Pulmonary/Chest: Effort normal and breath sounds normal.  Abdominal: Soft. Bowel sounds are normal. He exhibits no distension and no mass. There is tenderness. There is no rebound and no guarding.       Mildly tender in the right and left lower quadrants.  Musculoskeletal:       Right hip with NROM.  Neg straight leg rasie. Hip, knee and ankle strength 5/5 bilat. Patellar reflexes 1+ bilaterally.   Neurological: He  is alert and oriented to person, place, and time.  Skin: Skin is warm and dry.  Psychiatric: He has a normal mood and affect.          Assessment & Plan:  Right Hip Pain - Will get an xray today and see if has any significant arthritis in the joint. I don't think this is his sciatica.   Lower abdominal Pain - UA is + for LE. Will send a culture.

## 2010-07-30 NOTE — Assessment & Plan Note (Signed)
Not well controlled. He is really resistant to adding another BP pill. Will have him come back in 2 weeks to recheck but will likely need to add a calcium channel blocker. He doesn't check his BP at home.  Also make sure taking the metoprolol BID.

## 2010-07-30 NOTE — Telephone Encounter (Signed)
He does have some mild arthritis in his hip.  He does have some hardening of the arteries thought. I would recommend we schedule him with ortho.

## 2010-08-01 ENCOUNTER — Telehealth: Payer: Self-pay | Admitting: Family Medicine

## 2010-08-01 NOTE — Telephone Encounter (Signed)
Call pt: Urine cx hsd been reincubated for better growth.

## 2010-08-03 ENCOUNTER — Telehealth: Payer: Self-pay | Admitting: Family Medicine

## 2010-08-03 ENCOUNTER — Other Ambulatory Visit: Payer: Self-pay | Admitting: Family Medicine

## 2010-08-03 LAB — URINE CULTURE: Colony Count: 35000

## 2010-08-03 MED ORDER — AMOXICILLIN 500 MG PO CAPS: 500.0000 mg | ORAL_CAPSULE | Freq: Three times a day (TID) | ORAL | Status: AC

## 2010-08-03 NOTE — Telephone Encounter (Signed)
Pt informed of his urine culture results.  Told that an Ab Tx for UC has been sent to his pharm, and he is to take for 7 days.  Told pt to increase water intake and cranberry juice to flush the urinary system.  Told if not better after the 7 days of antibiotic to call back to office. Joseph Newcomer, LPN [facility]

## 2010-08-03 NOTE — Telephone Encounter (Signed)
Pls let pt know that his urine culture grew out bacteria that is sensitive to amoxicillin.  Will treat with amoxicillin 500 mg 1 tab 3 x a day for 7 days. Call if symptoms have not resolved after 7 days.

## 2010-08-09 MED ORDER — LOSARTAN POTASSIUM 100 MG PO TABS: 100.0000 mg | ORAL_TABLET | Freq: Every day | ORAL | Status: DC

## 2010-08-09 NOTE — Telephone Encounter (Signed)
Pt advsied of med change and referral to endocrine is clarified.

## 2010-08-12 ENCOUNTER — Telehealth: Payer: Self-pay | Admitting: Family Medicine

## 2010-08-12 NOTE — Telephone Encounter (Signed)
Pt advised of add testing needed, will have done in AM. States has appt MOnday the 7/9 w/endocrine.

## 2010-08-12 NOTE — Telephone Encounter (Signed)
Call pt: I go the final result on his PTH:  While we are waiting for the endocrine appt needs to do additional blood work. Need CBC, SPEP, UPEP, PSA, and CXR.  Will put in orders adn pt can go anytime to the lab and xray. No fasting needed. Go with full bladder.

## 2010-08-13 ENCOUNTER — Ambulatory Visit
Admission: RE | Admit: 2010-08-13 | Discharge: 2010-08-13 | Disposition: A | Discharge status: Home / Self Care | Payer: BC Managed Care – PPO | Source: Ambulatory Visit | Attending: Family Medicine | Admitting: Family Medicine

## 2010-08-13 ENCOUNTER — Telehealth: Payer: Self-pay | Admitting: Family Medicine

## 2010-08-13 LAB — CBC WITH DIFFERENTIAL/PLATELET
Basophils Relative: 0 % (ref 0–1)
Eosinophils Absolute: 0.1 10*3/uL (ref 0.0–0.7)
Eosinophils Relative: 2 % (ref 0–5)
HCT: 46.2 % (ref 39.0–52.0)
Lymphocytes Relative: 35 % (ref 12–46)
MCV: 90.8 fL (ref 78.0–100.0)
Neutrophils Relative %: 54 % (ref 43–77)
Platelets: 243 10*3/uL (ref 150–400)
RBC: 5.09 MIL/uL (ref 4.22–5.81)
WBC: 6.9 10*3/uL (ref 4.0–10.5)

## 2010-08-13 LAB — PSA: PSA: 3.87 ng/mL (ref <=4.00)

## 2010-08-13 NOTE — Telephone Encounter (Signed)
After the order and printed it yesterday. It should have been faxed the lab yesterday. He should be able to just go straight to the lab for this.

## 2010-08-13 NOTE — Telephone Encounter (Signed)
Pt informed of his CXR being stable and no new findings.  Pt asked about a urine test and I told him I would send a message to Dr. Marlyne Beards, LPN [facility]

## 2010-08-13 NOTE — Telephone Encounter (Signed)
Call patient: Chest x-ray is stable no new findings.

## 2010-08-16 ENCOUNTER — Telehealth: Payer: Self-pay | Admitting: Family Medicine

## 2010-08-16 NOTE — Telephone Encounter (Signed)
Advised pt of resullts. States he is already taking 1000 mg of Vit D3. Will discuss further at next OV.

## 2010-08-16 NOTE — Telephone Encounter (Signed)
Called patient: Complete blood count is normal. I'm still waiting for a couple of his other lab results. Prostate test is normal. Vitamin D was borderline low. I recommend he start 1000 units once a day.

## 2010-08-17 ENCOUNTER — Other Ambulatory Visit: Payer: Self-pay | Admitting: Family Medicine

## 2010-08-17 LAB — PROTEIN ELECTROPHORESIS, SERUM
Alpha-1-Globulin: 5.1 % — ABNORMAL HIGH (ref 2.9–4.9)
Alpha-2-Globulin: 12.1 % — ABNORMAL HIGH (ref 7.1–11.8)
Beta 2: 3.7 % (ref 3.2–6.5)
Total Protein, Serum Electrophoresis: 6.4 g/dL (ref 6.0–8.3)

## 2010-08-17 NOTE — Telephone Encounter (Signed)
Call [id number] test is nl. Vita D is low. Start 1000iu daily. Protein electrophoresis was none specific.  Still awaiting urine test. Please  Send copy of labs to [facility]. We made a referral for hmi there.

## 2010-08-17 NOTE — Telephone Encounter (Signed)
Trish from [employer] office called and is needing some information faxed to their office. I could not understand what she needed from the message on the voice mail but it something a nurse or Dr. Eulah Citizen be able print out and send. Trish number [phone]

## 2010-08-18 NOTE — Telephone Encounter (Signed)
Pt informed. [name], LPN /Triage  

## 2010-08-18 NOTE — Telephone Encounter (Signed)
Pt informed of his recent lab values.  Told pt to start the Vit D 1000 IU daily.  Told still awaiting the urine test.  Will send a copy of the labs to [facility].  Will call her as well to see what else is needed. Jarvis Newcomer, LPN [facility]

## 2010-08-19 ENCOUNTER — Telehealth: Payer: Self-pay | Admitting: Family Medicine

## 2010-08-19 NOTE — Telephone Encounter (Signed)
Plese fax over copy of his SPEP to Dr. Warden Fillers (endocrinology) [facility].  Aldo please call lab and and find out when to expect results of UPEP. IIt has been over a week.

## 2010-08-20 NOTE — Telephone Encounter (Signed)
Labs were faxed over to Dr. Warden Fillers at [facility].  Lab called and retrieved the UPEP results and faxed those as well. Jarvis Newcomer, LPN [facility]

## 2010-08-23 ENCOUNTER — Encounter: Payer: Self-pay | Admitting: Family Medicine

## 2010-08-23 ENCOUNTER — Ambulatory Visit (INDEPENDENT_AMBULATORY_CARE_PROVIDER_SITE_OTHER): Payer: BC Managed Care – PPO | Admitting: Family Medicine

## 2010-08-23 DIAGNOSIS — Z87891 Personal history of nicotine dependence: Secondary | ICD-10-CM | POA: Insufficient documentation

## 2010-08-23 DIAGNOSIS — Z720 Tobacco use: Secondary | ICD-10-CM

## 2010-08-23 DIAGNOSIS — F17200 Nicotine dependence, unspecified, uncomplicated: Secondary | ICD-10-CM

## 2010-08-23 MED ORDER — VARENICLINE TARTRATE 0.5 MG X 11 & 1 MG X 42 PO MISC: ORAL | Status: AC

## 2010-08-23 MED ORDER — AMLODIPINE BESYLATE 5 MG PO TABS: 5.0000 mg | ORAL_TABLET | Freq: Every day | ORAL | Status: DC

## 2010-08-23 NOTE — Progress Notes (Signed)
  Subjective:    Patient ID: Joseph Berg, male    DOB: [birth year], 78 y.o.   MRN: [id number]  HPI Smoking a ppd. Quit at one time for almost 2 years. Quit cold [place]. Does smoking first thing in the AM. No smoking at night. Stress is a trigger. Has tried accupuncture and the nitcotine patches. His wife does not smoke. He has tried nicotine patches in the past and says these didn't work well. He also tried the gum but that made him nauseated and feel sick. He has never  tried any prescription medications to quit. He is currently also motivated because his insurance company will raise his rate if he continues to smoke   Review of Systems     Objective:   Physical Exam  Constitutional: He is oriented to person, place, and time. He appears well-developed and well-nourished.  Cardiovascular: Normal rate, regular rhythm and normal heart sounds.   Pulmonary/Chest: Effort normal and breath sounds normal.  Neurological: He is alert and oriented to person, place, and time.  Skin: Skin is warm and dry.  Psychiatric: He has a normal mood and affect.          Assessment & Plan:  Tobacco abuse-we discussed strategies to quit. He is not happy with nicotine replacement in the past. We discussed Wellbutrin Chantix. We discussed the potential risks and side effects of these medications including the potential benefit. Also recommend he use a quit program in addition to the medication. I warned him of side effects including mental status changes and the potential to affect his mood with the Chantix and to monitor for this. Followup in one month. 25 minutes spent in this discussion and counseling about tobacco cessation. I also discussed how to properly use nicotine,, theI do not recommend using this with the Chantix. I gave him the phone number for one 800 quit now

## 2010-08-23 NOTE — Patient Instructions (Signed)
1-800 QUIT NOW   

## 2010-08-25 ENCOUNTER — Encounter: Payer: Self-pay | Admitting: Family Medicine

## 2010-08-30 ENCOUNTER — Ambulatory Visit: Payer: BC Managed Care – PPO | Attending: Sports Medicine | Admitting: Physical Therapy

## 2010-08-30 DIAGNOSIS — M25559 Pain in unspecified hip: Secondary | ICD-10-CM | POA: Insufficient documentation

## 2010-08-30 DIAGNOSIS — IMO0001 Reserved for inherently not codable concepts without codable children: Secondary | ICD-10-CM | POA: Insufficient documentation

## 2010-08-30 DIAGNOSIS — M6281 Muscle weakness (generalized): Secondary | ICD-10-CM | POA: Insufficient documentation

## 2010-09-01 ENCOUNTER — Encounter: Payer: Self-pay | Admitting: Family Medicine

## 2010-09-02 ENCOUNTER — Ambulatory Visit: Payer: BC Managed Care – PPO | Admitting: Physical Therapy

## 2010-09-06 ENCOUNTER — Ambulatory Visit: Payer: BC Managed Care – PPO | Admitting: Physical Therapy

## 2010-09-06 ENCOUNTER — Encounter: Payer: BC Managed Care – PPO | Admitting: Physical Therapy

## 2010-09-08 ENCOUNTER — Telehealth: Payer: Self-pay | Admitting: Family Medicine

## 2010-09-08 ENCOUNTER — Ambulatory Visit: Payer: BC Managed Care – PPO | Attending: Sports Medicine | Admitting: Physical Therapy

## 2010-09-08 DIAGNOSIS — M6281 Muscle weakness (generalized): Secondary | ICD-10-CM | POA: Insufficient documentation

## 2010-09-08 DIAGNOSIS — IMO0001 Reserved for inherently not codable concepts without codable children: Secondary | ICD-10-CM | POA: Insufficient documentation

## 2010-09-08 DIAGNOSIS — M25559 Pain in unspecified hip: Secondary | ICD-10-CM | POA: Insufficient documentation

## 2010-09-08 NOTE — Telephone Encounter (Signed)
Is the pt to still take the plain losartan as well??  Please advise because pt is also taking that med.  I think this is what is confusing the pts wife that he has a total of 3 meds for HTN. Routed to Dr. Marlyne Beards, LPN [facility]

## 2010-09-08 NOTE — Telephone Encounter (Signed)
Pt informed that he should be on all three of his HTN medications.  Went over the complete med list with the pt. Jarvis Newcomer, LPN [facility]

## 2010-09-08 NOTE — Telephone Encounter (Signed)
Yes, plain losarta as well ( as long as it doesn't say HCT at the end of it). I wanted him off the diuretic part because it was drying him out too much. So yes should be on all 3.

## 2010-09-08 NOTE — Telephone Encounter (Signed)
Pt's wife called and they are confused about the pt regimen regarding his blood pressure.  I see on the last office visit that the pt was given a script for amlodipine 5 mg PO daily, but no other mention of any of the other meds on the list to be discontinued, changed, etc.  Pts wife did say the pt was no longer on the  Losartan HCTZ.  Said the provider stopped this med so I discontinued it on the meds/orders section.  Let me know what the current regemin for this pt is so I can call the pt and inform him. Routed to Dr. Marlyne Beards, LPN [facility]

## 2010-09-08 NOTE — Telephone Encounter (Signed)
He should also be on metoprolol bid in addition to the amlodipine and then I want him to come in for BP check in the next 1-2 weeks.

## 2010-09-14 ENCOUNTER — Ambulatory Visit: Payer: BC Managed Care – PPO | Admitting: Physical Therapy

## 2010-09-17 ENCOUNTER — Ambulatory Visit: Payer: BC Managed Care – PPO | Admitting: Physical Therapy

## 2010-09-20 NOTE — Telephone Encounter (Signed)
error 

## 2010-09-21 ENCOUNTER — Ambulatory Visit: Payer: BC Managed Care – PPO | Admitting: Physical Therapy

## 2010-09-23 ENCOUNTER — Encounter: Payer: Self-pay | Admitting: Family Medicine

## 2010-09-23 ENCOUNTER — Ambulatory Visit (INDEPENDENT_AMBULATORY_CARE_PROVIDER_SITE_OTHER): Payer: BC Managed Care – PPO | Admitting: Family Medicine

## 2010-09-23 VITALS — BP 132/70 | HR 57 | Wt 211.0 lb

## 2010-09-23 DIAGNOSIS — Z23 Encounter for immunization: Secondary | ICD-10-CM

## 2010-09-23 DIAGNOSIS — R351 Nocturia: Secondary | ICD-10-CM

## 2010-09-23 DIAGNOSIS — N390 Urinary tract infection, site not specified: Secondary | ICD-10-CM

## 2010-09-23 DIAGNOSIS — Z720 Tobacco use: Secondary | ICD-10-CM

## 2010-09-23 DIAGNOSIS — F17200 Nicotine dependence, unspecified, uncomplicated: Secondary | ICD-10-CM

## 2010-09-23 LAB — POCT URINALYSIS DIPSTICK
Bilirubin, UA: NEGATIVE
Nitrite, UA: NEGATIVE
Spec Grav, UA: 1.02
pH, UA: 6.5

## 2010-09-23 NOTE — Patient Instructions (Signed)
We will call you with the culture results 

## 2010-09-23 NOTE — Progress Notes (Signed)
  Subjective:    Patient ID: Joseph Berg, male    DOB: July 25, 1933, 77 y.o.   MRN: [id number]  HPI On the chantix for 2 weeks and says he is doing well. He feels more depressed yesterday, but not overall.  No dreams. NO nausea. He doesn't wnt to top the med because he feels it is really helping his mood.   Urinating frequent at night up to 5 days for the last 6 weeks. NO dysuria or hematuria. Not as bothersome during the day. At night will urinate and as soon as he gets back to bed has the urge to go again.  Prior hx of + UTI.      Review of Systems     Objective:   Physical Exam  Constitutional: He is oriented to person, place, and time. He appears well-developed and well-nourished.  HENT:  Head: Normocephalic and atraumatic.  Cardiovascular: Normal rate, regular rhythm and normal heart sounds.   Pulmonary/Chest: Effort normal and breath sounds normal.  Neurological: He is alert and oriented to person, place, and time.  Skin: Skin is warm and dry.  Psychiatric: He has a normal mood and affect. His behavior is normal.          Assessment & Plan:  Nocturia - PSA was nl in July.  Consider UTI vs BPH.  UA shows blood. We'll send for culture to confirm. If negative may consider this could be related to BPH.  tob abuse - I discussed that if her notices persistant mood change then he will need to stop the Chantix as it may be altering hs mood. He reassures me it was only one day he felt sad.  He feels better today.  Can change to Wellbutrin if needed. For now recommend cut the tabs in half to stay on 0.5mg  dose and see if this helps.

## 2010-09-24 ENCOUNTER — Encounter: Payer: BC Managed Care – PPO | Admitting: Physical Therapy

## 2010-09-30 ENCOUNTER — Telehealth: Payer: Self-pay | Admitting: Family Medicine

## 2010-09-30 NOTE — Telephone Encounter (Signed)
Call pt: Urine cx is neg. If still peeing frequently could be his prostate. We could start med to help shrink prostate.

## 2010-10-01 ENCOUNTER — Telehealth: Payer: Self-pay | Admitting: Family Medicine

## 2010-10-01 NOTE — Telephone Encounter (Signed)
Left message on pts cell

## 2010-10-02 ENCOUNTER — Telehealth: Payer: Self-pay | Admitting: Family Medicine

## 2010-10-02 LAB — URINE CULTURE

## 2010-10-02 NOTE — Telephone Encounter (Signed)
Call pt: Final cutlure results are back in.  If still having sxs then recommend recollectio because grew out small amt bacteria but not enough to be oconsidered + to tx.

## 2010-10-04 NOTE — Telephone Encounter (Signed)
LM of results for the patient. Instructed to call back if questions or needs another appt. KJ LPN

## 2010-10-04 NOTE — Telephone Encounter (Signed)
error 

## 2010-10-06 ENCOUNTER — Telehealth: Payer: Self-pay | Admitting: Family Medicine

## 2010-10-06 DIAGNOSIS — F17200 Nicotine dependence, unspecified, uncomplicated: Secondary | ICD-10-CM

## 2010-10-06 MED ORDER — DUTASTERIDE 0.5 MG PO CAPS: 0.5000 mg | ORAL_CAPSULE | Freq: Every day | ORAL | Status: DC

## 2010-10-06 NOTE — Telephone Encounter (Signed)
Med sent. If too expensive let me know.  Call if sxs not better in 1-2 weeks.  F/U with me in 1 months.

## 2010-10-06 NOTE — Telephone Encounter (Signed)
Call patient: Based on his ADH and the fact that he is a smoker but guidelines recommend that we do a screening for abdominal aortic aneurysm because he is at risk for this. BX gun under his insurance company about ordering this. Typically this is done with an ultrasound of the abdomen which does not expose him to radiation. There is a fairly easy test to do. If he is okay with this please let me know and we can schedule this.

## 2010-10-06 NOTE — Telephone Encounter (Signed)
Left message to call back  

## 2010-10-06 NOTE — Telephone Encounter (Signed)
Pt notified with the situation with his insurance, and pt is willing to do the test.  Just get set up and let him know.  Pt needs advising.  Still having freq urination during the night.  Up every 5-10 minutes last night using the bathroom.  Said we told him to call back and we would prescribe a medication to help him with this problem. Pt uses [facility]/ [address].  Told the pt to check with his pharmacy tomorrow afternoon to see if they have a med that has been sent. Jarvis Newcomer, LPN [facility]

## 2010-10-07 ENCOUNTER — Other Ambulatory Visit: Payer: BC Managed Care – PPO

## 2010-10-07 NOTE — Telephone Encounter (Signed)
Pt informed that his new medication for urinary freq was sent in to his pharm.  Told to call if problem not better in 1--2 weeks.  Pt instructed to fup in 1 mth in office with Dr.Metheney. Jarvis Newcomer, LPN [facility]

## 2010-12-07 ENCOUNTER — Other Ambulatory Visit: Payer: Self-pay | Admitting: Family Medicine

## 2010-12-23 ENCOUNTER — Encounter: Payer: Self-pay | Admitting: Family Medicine

## 2010-12-23 ENCOUNTER — Ambulatory Visit (INDEPENDENT_AMBULATORY_CARE_PROVIDER_SITE_OTHER): Payer: BC Managed Care – PPO | Admitting: Family Medicine

## 2010-12-23 VITALS — BP 137/65 | HR 99 | Wt 224.0 lb

## 2010-12-23 DIAGNOSIS — I10 Essential (primary) hypertension: Secondary | ICD-10-CM

## 2010-12-23 DIAGNOSIS — H1010 Acute atopic conjunctivitis, unspecified eye: Secondary | ICD-10-CM

## 2010-12-23 DIAGNOSIS — H1045 Other chronic allergic conjunctivitis: Secondary | ICD-10-CM

## 2010-12-23 NOTE — Progress Notes (Signed)
  Subjective:    Patient ID: Joseph Berg, male    DOB: Sep 19, 1933, 77 y.o.   MRN: [id number]  Hypertension This is a chronic problem. The current episode started more than 1 year ago. The problem is controlled. Pertinent negatives include no chest pain or shortness of breath. There are no associated agents to hypertension. Past treatments include angiotensin blockers and beta blockers. There are no compliance problems.    Some exercise but not very regular. He is still not smoking since the spring. He says he will fall asleep around lunch time.  Says he is sleeping well at night. No snoring. No prior hx of sleep apnea.   Waking up with crusty eyes. No pain or vision changes. Says it always seems to happen in the fall.    Review of Systems  Respiratory: Negative for shortness of breath.   Cardiovascular: Negative for chest pain.       Objective:   Physical Exam  Constitutional: He is oriented to person, place, and time. He appears well-developed and well-nourished.  HENT:  Head: Normocephalic and atraumatic.  Cardiovascular: Normal rate, regular rhythm and normal heart sounds.   Pulmonary/Chest: Effort normal and breath sounds normal.  Neurological: He is alert and oriented to person, place, and time.  Skin: Skin is warm and dry.  Psychiatric: He has a normal mood and affect. His behavior is normal.          Assessment & Plan:  HTN- Looks great today. eup the good work. Encouraged him to work on increasing her activity and exercise level and work on weight loss.  He is overdue for CMP and lipids. Given lab slip today. Will try moving amlodipine to bedtime and see if helps with the lunch time sleepiness.   Allergic conjunctivitis - Will tx with OTC zaditor.

## 2010-12-23 NOTE — Patient Instructions (Addendum)
Move amlodipine to evening and see if that helps with falling asleep in the middle of the day.  Zaditor eye allergy drops.

## 2011-01-01 ENCOUNTER — Other Ambulatory Visit: Payer: Self-pay | Admitting: Family Medicine

## 2011-01-07 ENCOUNTER — Telehealth: Payer: Self-pay | Admitting: *Deleted

## 2011-01-07 LAB — COMPLETE METABOLIC PANEL WITH GFR
ALT: 25 U/L (ref 0–53)
AST: 24 U/L (ref 0–37)
Albumin: 4.7 g/dL (ref 3.5–5.2)
BUN: 20 mg/dL (ref 6–23)
Calcium: 10 mg/dL (ref 8.4–10.5)
Chloride: 107 mEq/L (ref 96–112)
Potassium: 4.1 mEq/L (ref 3.5–5.3)
Sodium: 142 mEq/L (ref 135–145)
Total Protein: 6.6 g/dL (ref 6.0–8.3)

## 2011-01-07 LAB — LIPID PANEL
Cholesterol: 159 mg/dL (ref 0–200)
LDL Cholesterol: 83 mg/dL (ref 0–99)
Total CHOL/HDL Ratio: 3.9 Ratio
VLDL: 35 mg/dL (ref 0–40)

## 2011-01-07 MED ORDER — TERAZOSIN HCL 5 MG PO CAPS: 5.0000 mg | ORAL_CAPSULE | Freq: Every day | ORAL | Status: DC

## 2011-01-07 NOTE — Telephone Encounter (Signed)
Pt called stating insurance will no longer pay for avodart. Wife is going to pharm this evening so Pt would like something sent before wife gets there. Pt may have to travel to [place] this weekend bc his grandson is very ill.

## 2011-01-07 NOTE — Telephone Encounter (Signed)
rx sent for terazosin. Should be generic. Take at bedtime.

## 2011-01-07 NOTE — Telephone Encounter (Signed)
Pt calls and states returning call from someone yesterday who called about meds he is on. I do not see anything in chart regarding the phone call

## 2011-01-07 NOTE — Telephone Encounter (Signed)
Pt aware.

## 2011-02-08 ENCOUNTER — Other Ambulatory Visit: Payer: Self-pay | Admitting: Family Medicine

## 2011-02-21 ENCOUNTER — Encounter: Payer: Self-pay | Admitting: Physician Assistant

## 2011-02-21 ENCOUNTER — Ambulatory Visit (INDEPENDENT_AMBULATORY_CARE_PROVIDER_SITE_OTHER): Payer: BC Managed Care – PPO | Admitting: Physician Assistant

## 2011-02-21 VITALS — BP 141/71 | HR 68 | Temp 98.2°F | Ht 69.0 in | Wt 234.0 lb

## 2011-02-21 DIAGNOSIS — J329 Chronic sinusitis, unspecified: Secondary | ICD-10-CM

## 2011-02-21 DIAGNOSIS — H1013 Acute atopic conjunctivitis, bilateral: Secondary | ICD-10-CM

## 2011-02-21 DIAGNOSIS — H1045 Other chronic allergic conjunctivitis: Secondary | ICD-10-CM

## 2011-02-21 MED ORDER — AMOXICILLIN 500 MG PO CAPS: 1000.0000 mg | ORAL_CAPSULE | Freq: Three times a day (TID) | ORAL | Status: AC

## 2011-02-21 MED ORDER — METHYLPREDNISOLONE (PAK) 4 MG PO TABS: ORAL_TABLET | ORAL | Status: AC

## 2011-02-21 NOTE — Progress Notes (Signed)
  Subjective:    Patient ID: Joseph Berg, male    DOB: 1934-01-21, 78 y.o.   MRN: [id number]  HPI Patient has had head congestion, watery eyes, and headaches since November(2 1/2 months). He was seen by Dr.Metheney in November and she gave him eye drops for allergic conjunctivitis. He has been using eye drops and reports it does not help. He went to pharmacist and they told him to get nasal decongestant spray and take loratadine daily. He reports no relief with that. He is frustrated with his eyes being matted shut in the mornings and draining all day long. He reports "I just feel so congested". He has also had some ear pain and a sore throat. He denies any cough, SOB, wheezing, or other lower respiratory symptoms. He denies fever, chills, n/v.   Review of Systems     Objective:   Physical Exam  Constitutional: He is oriented to person, place, and time. He appears well-developed and well-nourished.  HENT:  Head: Normocephalic and atraumatic.  Nose: Nose normal.  Mouth/Throat: Oropharynx is clear and moist. No oropharyngeal exudate.       No maxillary tenderness. Air bubbles visualized bilateral TM's. Light reflex present.  Eyes: Right eye exhibits discharge. Left eye exhibits discharge.       conjunctivia injected with clear, watery discharge present in bilateral eyes.  Neck: Normal range of motion. Neck supple.  Cardiovascular: Normal rate, regular rhythm and normal heart sounds.   Pulmonary/Chest: Effort normal and breath sounds normal. He has no wheezes.  Lymphadenopathy:    He has no cervical adenopathy.  Neurological: He is alert and oriented to person, place, and time.  Skin: Skin is warm and dry.  Psychiatric: He has a normal mood and affect. His behavior is normal.          Assessment & Plan:  Sinusitis- Gave Amoxicillin and Medrol Dose pack. Follow-up if not better in 1 week.  Allergic conjunctivitis-  Symptomatic care. Encouraged him to remain on eye drops and  Loratadine daily.

## 2011-02-21 NOTE — Patient Instructions (Signed)
Stay on Loratadine every day. Start Medrol dose pak. Then start Amoxicillin. Follow-up if not better in 1 week.

## 2011-02-23 ENCOUNTER — Other Ambulatory Visit: Payer: Self-pay | Admitting: Family Medicine

## 2011-03-02 ENCOUNTER — Encounter: Payer: Self-pay | Admitting: Family Medicine

## 2011-03-02 DIAGNOSIS — N400 Enlarged prostate without lower urinary tract symptoms: Secondary | ICD-10-CM | POA: Insufficient documentation

## 2011-03-11 ENCOUNTER — Other Ambulatory Visit: Payer: Self-pay | Admitting: Family Medicine

## 2011-03-14 ENCOUNTER — Ambulatory Visit (INDEPENDENT_AMBULATORY_CARE_PROVIDER_SITE_OTHER): Payer: BC Managed Care – PPO | Admitting: Family Medicine

## 2011-03-14 ENCOUNTER — Encounter: Payer: Self-pay | Admitting: Family Medicine

## 2011-03-14 DIAGNOSIS — J309 Allergic rhinitis, unspecified: Secondary | ICD-10-CM

## 2011-03-14 NOTE — Patient Instructions (Signed)
Allegra 180mg  once a day. Start nasal spray  Nasal saline for you nose  twice a day If not better in 2 weeks then call med.  Allergic Rhinitis Allergic rhinitis is when the mucous membranes in the nose respond to allergens. Allergens are particles in the air that cause your body to have an allergic reaction. This causes you to release allergic antibodies. Through a chain of events, these eventually cause you to release histamine into the blood stream (hence the use of antihistamines). Although meant to be protective to the body, it is this release that causes your discomfort, such as frequent sneezing, congestion and an itchy runny nose.   CAUSES   The pollen allergens may come from grasses, trees, and weeds. This is seasonal allergic rhinitis, or "hay fever." Other allergens cause year-round allergic rhinitis (perennial allergic rhinitis) such as house dust mite allergen, pet dander and mold spores.   SYMPTOMS    Nasal stuffiness (congestion).     Runny, itchy nose with sneezing and tearing of the eyes.     There is often an itching of the mouth, eyes and ears.  It cannot be cured, but it can be controlled with medications. DIAGNOSIS   If you are unable to determine the offending allergen, skin or blood testing may find it. TREATMENT    Avoid the allergen.     Medications and allergy shots (immunotherapy) can help.     Hay fever may often be treated with antihistamines in pill or nasal spray forms. Antihistamines block the effects of histamine. There are over-the-counter medicines that may help with nasal congestion and swelling around the eyes. Check with your caregiver before taking or giving this medicine.  If the treatment above does not work, there are many new medications your caregiver can prescribe. Stronger medications may be used if initial measures are ineffective. Desensitizing injections can be used if medications and avoidance fails. Desensitization is when a patient is given  ongoing shots until the body becomes less sensitive to the allergen. Make sure you follow up with your caregiver if problems continue. SEEK MEDICAL CARE IF:    You develop fever (more than 100.5 F (38.1 C).     You develop a cough that does not stop easily (persistent).     You have shortness of breath.     You start wheezing.     Symptoms interfere with normal daily activities.  Document Released: 10/19/2000 Document Revised: 10/06/2010 Document Reviewed: 04/30/2008 [facility] Patient Information 2012 [place], [place].

## 2011-03-14 NOTE — Progress Notes (Signed)
  Subjective:    Patient ID: Joseph Berg, male    DOB: 11-08-1933, 78 y.o.   MRN: [id number]  HPI Has had sneezing, runny nose, watery eyes for about 2 months.  Was seen about a month ago and was tx for sinusitis. He says he is not any better.  Went ot eye doc who also recommended allergic eye drops but says he had already tried these before and they didn't seem to help.    Note his grandson and sister died over the Holidays.  Review of Systems     Objective:   Physical Exam  Constitutional: He is oriented to person, place, and time. He appears well-developed and well-nourished.  HENT:  Head: Normocephalic and atraumatic.  Right Ear: External ear normal.  Left Ear: External ear normal.  Nose: Nose normal.  Mouth/Throat: Oropharynx is clear and moist.       TMs and canals are clear.   Eyes: Conjunctivae and EOM are normal. Pupils are equal, round, and reactive to light.  Neck: Neck supple. No thyromegaly present.  Cardiovascular: Normal rate and normal heart sounds.   Pulmonary/Chest: Effort normal and breath sounds normal.  Lymphadenopathy:    He has no cervical adenopathy.  Neurological: He is alert and oriented to person, place, and time.  Skin: Skin is warm and dry.  Psychiatric: He has a normal mood and affect.          Assessment & Plan:  AR- Discussed tx with an oral antihistamine and a topical nasal steroid spray. I gave him a sample of varus to start with. I also recommend Allegra 180 mg once a day over-the-counter. If he's not significantly improved in 2 weeks and please call the office and I'll refer him to ENT for further evaluation.

## 2011-04-14 ENCOUNTER — Other Ambulatory Visit: Payer: Self-pay | Admitting: Family Medicine

## 2011-04-20 ENCOUNTER — Telehealth: Payer: Self-pay | Admitting: Family Medicine

## 2011-04-20 NOTE — Telephone Encounter (Signed)
Called and left message on vm with doc reccomendations

## 2011-04-20 NOTE — Telephone Encounter (Signed)
Since he has a history of smoking and based on his age of 71 guidelines recommend screening for abdominal aortic aneurysm. This is done with ultrasound. No radiation is involved. If he is okay with scheduling this please let me know. We did receive a letter from his insurance company recommending that we do this screening test.

## 2011-05-02 ENCOUNTER — Ambulatory Visit (INDEPENDENT_AMBULATORY_CARE_PROVIDER_SITE_OTHER): Payer: BC Managed Care – PPO | Admitting: Family Medicine

## 2011-05-02 ENCOUNTER — Telehealth: Payer: Self-pay | Admitting: *Deleted

## 2011-05-02 ENCOUNTER — Encounter: Payer: Self-pay | Admitting: Family Medicine

## 2011-05-02 VITALS — BP 117/69 | HR 62 | Temp 98.3°F | Ht 69.0 in | Wt 243.0 lb

## 2011-05-02 DIAGNOSIS — J329 Chronic sinusitis, unspecified: Secondary | ICD-10-CM

## 2011-05-02 DIAGNOSIS — L0291 Cutaneous abscess, unspecified: Secondary | ICD-10-CM | POA: Insufficient documentation

## 2011-05-02 MED ORDER — SULFAMETHOXAZOLE-TRIMETHOPRIM 800-160 MG PO TABS: 1.0000 | ORAL_TABLET | Freq: Two times a day (BID) | ORAL | Status: AC

## 2011-05-02 MED ORDER — TERAZOSIN HCL 10 MG PO CAPS: 10.0000 mg | ORAL_CAPSULE | Freq: Every day | ORAL | Status: DC

## 2011-05-02 NOTE — Telephone Encounter (Signed)
Pt states he is taking metoprolol 100mg  BID. He ask to let you know.

## 2011-05-02 NOTE — Progress Notes (Signed)
  Subjective:    Patient ID: Joseph Berg, male    DOB: [birth year], 78 y.o.   MRN: [id number]  HPI Still nasal congestion. Now getting yellow mucous from his nose last week. Still no fever.  Out of the allergy meds.  Had amox in January. Dec appetite. His ears. But no pain. No sore throat. No GI symptoms. Besides the allergy medications that he was taking he is not taking anything else.   Review of Systems     Objective:   Physical Exam  Constitutional: He is oriented to person, place, and time. He appears well-developed and well-nourished.  HENT:  Head: Normocephalic and atraumatic.  Right Ear: External ear normal.  Left Ear: External ear normal.  Nose: Nose normal.  Mouth/Throat: Oropharynx is clear and moist.       TMs and canals are clear.   Eyes: Conjunctivae and EOM are normal. Pupils are equal, round, and reactive to light.  Neck: Neck supple. No thyromegaly present.  Cardiovascular: Normal rate and normal heart sounds.   Pulmonary/Chest: Effort normal and breath sounds normal.  Lymphadenopathy:    He has no cervical adenopathy.  Neurological: He is alert and oriented to person, place, and time.  Skin: Skin is warm and dry.  Psychiatric: He has a normal mood and affect.          Assessment & Plan:  Sinusitis - likely bacterial. Will tx with BActrim DS bid x 10 days. Call if not 90% better by completeion of th ABX.  Encouraged him to continue an antihistamine for spring and nasal allergies. I still think he has some underlying allergic rhinitis. It is not significantly better then we'll refer him to ENT for further evaluation.

## 2011-05-02 NOTE — Telephone Encounter (Signed)
OK can you correct his med list. He has 2 different onces on there.

## 2011-06-08 ENCOUNTER — Other Ambulatory Visit: Payer: Self-pay | Admitting: Family Medicine

## 2011-06-22 ENCOUNTER — Ambulatory Visit (INDEPENDENT_AMBULATORY_CARE_PROVIDER_SITE_OTHER): Payer: BC Managed Care – PPO | Admitting: Family Medicine

## 2011-06-22 ENCOUNTER — Encounter: Payer: Self-pay | Admitting: Family Medicine

## 2011-06-22 VITALS — BP 125/64 | HR 95 | Temp 97.6°F | Ht 69.0 in | Wt 240.0 lb

## 2011-06-22 DIAGNOSIS — J329 Chronic sinusitis, unspecified: Secondary | ICD-10-CM

## 2011-06-22 DIAGNOSIS — E785 Hyperlipidemia, unspecified: Secondary | ICD-10-CM

## 2011-06-22 DIAGNOSIS — I10 Essential (primary) hypertension: Secondary | ICD-10-CM

## 2011-06-22 LAB — LIPID PANEL
LDL Cholesterol: 68 mg/dL (ref 0–99)
Total CHOL/HDL Ratio: 4.2 Ratio
VLDL: 31 mg/dL (ref 0–40)

## 2011-06-22 MED ORDER — AMOXICILLIN-POT CLAVULANATE 875-125 MG PO TABS: 1.0000 | ORAL_TABLET | Freq: Two times a day (BID) | ORAL | Status: AC

## 2011-06-22 NOTE — Progress Notes (Signed)
  Subjective:    Patient ID: Joseph Berg, male    DOB: 1933-11-13, 78 y.o.   MRN: [id number]  HPI URI x 3 wks.  ST initially but now resolved.  Bilat ear fullness and nasal congsetion, +runny nose, yellow d/c.  Painful prod cough w/ yellow sputum. + HA, + eye watering.  No fever.  + night sweats.  Seen before for allergies but feels this is worse.  Has tried mucinex, nyquail, or netti-pot - no relief.  No SOB.   HTN- wife checks it at home but doesn't now if well controlled.  Taking meds regularly.  Concerned about weight gain but hasn't really been exercising esp while sick.  Wife makes most of his meals and tries to limit salt intake.  No  CP or SOB.  Worse on the right side of face, pressure.   Hyperlipidemia -due to recheck.    Review of Systems     Objective:   Physical Exam  Constitutional: He is oriented to person, place, and time. He appears well-developed and well-nourished.  HENT:  Head: Normocephalic and atraumatic.  Right Ear: External ear normal.  Left Ear: External ear normal.  Nose: Nose normal.  Mouth/Throat: Oropharynx is clear and moist.       TMs and canals are clear. Nasal turbinates are not beter.  No facial tendnerness.   Eyes: Conjunctivae and EOM are normal. Pupils are equal, round, and reactive to light.  Neck: Neck supple. No thyromegaly present.  Cardiovascular: Normal rate and normal heart sounds.   Pulmonary/Chest: Effort normal and breath sounds normal.  Lymphadenopathy:    He has no cervical adenopathy.  Neurological: He is alert and oriented to person, place, and time.  Skin: Skin is warm and dry.  Psychiatric: He has a normal mood and affect.          Assessment & Plan:  Sinsusitis - Will tx with augmentn bid x 10 day. Call if not better in one weel. Continue symptomatic care. He is no longer smoking.    HTN - Well controlled.   Continue current regimen.  No longer smoking.  Encouraged more regular exercise and discussed weight loss.     Hyperlipidemia - due to recheck labs today. He is fasting.

## 2011-06-22 NOTE — Patient Instructions (Signed)

## 2011-06-23 LAB — COMPLETE METABOLIC PANEL WITH GFR
ALT: 16 U/L (ref 0–53)
AST: 20 U/L (ref 0–37)
Albumin: 4.2 g/dL (ref 3.5–5.2)
Alkaline Phosphatase: 28 U/L — ABNORMAL LOW (ref 39–117)
Calcium: 9.5 mg/dL (ref 8.4–10.5)
Chloride: 107 mEq/L (ref 96–112)
Potassium: 4.1 mEq/L (ref 3.5–5.3)
Sodium: 144 mEq/L (ref 135–145)
Total Protein: 6.4 g/dL (ref 6.0–8.3)

## 2011-07-08 ENCOUNTER — Other Ambulatory Visit: Payer: Self-pay | Admitting: Family Medicine

## 2011-08-08 ENCOUNTER — Other Ambulatory Visit: Payer: Self-pay | Admitting: *Deleted

## 2011-08-08 MED ORDER — FENOFIBRATE 145 MG PO TABS: 145.0000 mg | ORAL_TABLET | Freq: Every day | ORAL | Status: DC

## 2011-09-07 ENCOUNTER — Other Ambulatory Visit: Payer: Self-pay | Admitting: Family Medicine

## 2011-10-07 ENCOUNTER — Other Ambulatory Visit: Payer: Self-pay | Admitting: Family Medicine

## 2011-10-31 ENCOUNTER — Ambulatory Visit (INDEPENDENT_AMBULATORY_CARE_PROVIDER_SITE_OTHER): Payer: BC Managed Care – PPO | Admitting: Family Medicine

## 2011-10-31 ENCOUNTER — Encounter: Payer: Self-pay | Admitting: Family Medicine

## 2011-10-31 ENCOUNTER — Ambulatory Visit (INDEPENDENT_AMBULATORY_CARE_PROVIDER_SITE_OTHER): Payer: BC Managed Care – PPO

## 2011-10-31 VITALS — BP 122/80 | HR 60 | Wt 229.0 lb

## 2011-10-31 DIAGNOSIS — R05 Cough: Secondary | ICD-10-CM

## 2011-10-31 DIAGNOSIS — Z87891 Personal history of nicotine dependence: Secondary | ICD-10-CM

## 2011-10-31 DIAGNOSIS — R61 Generalized hyperhidrosis: Secondary | ICD-10-CM

## 2011-10-31 DIAGNOSIS — R37 Sexual dysfunction, unspecified: Secondary | ICD-10-CM

## 2011-10-31 DIAGNOSIS — R609 Edema, unspecified: Secondary | ICD-10-CM

## 2011-10-31 DIAGNOSIS — F529 Unspecified sexual dysfunction not due to a substance or known physiological condition: Secondary | ICD-10-CM

## 2011-10-31 DIAGNOSIS — Z23 Encounter for immunization: Secondary | ICD-10-CM

## 2011-10-31 DIAGNOSIS — R600 Localized edema: Secondary | ICD-10-CM

## 2011-10-31 DIAGNOSIS — J329 Chronic sinusitis, unspecified: Secondary | ICD-10-CM

## 2011-10-31 DIAGNOSIS — R059 Cough, unspecified: Secondary | ICD-10-CM

## 2011-10-31 DIAGNOSIS — R32 Unspecified urinary incontinence: Secondary | ICD-10-CM

## 2011-10-31 LAB — POCT URINALYSIS DIPSTICK
Bilirubin, UA: NEGATIVE
Glucose, UA: NEGATIVE
Ketones, UA: NEGATIVE
Leukocytes, UA: NEGATIVE
Nitrite, UA: NEGATIVE

## 2011-10-31 LAB — CBC WITH DIFFERENTIAL/PLATELET
Basophils Relative: 0 % (ref 0–1)
Eosinophils Absolute: 0.1 10*3/uL (ref 0.0–0.7)
Hemoglobin: 14.3 g/dL (ref 13.0–17.0)
Lymphs Abs: 1.8 10*3/uL (ref 0.7–4.0)
MCH: 29.1 pg (ref 26.0–34.0)
Monocytes Relative: 9 % (ref 3–12)
Neutro Abs: 4 10*3/uL (ref 1.7–7.7)
Neutrophils Relative %: 62 % (ref 43–77)
Platelets: 176 10*3/uL (ref 150–400)
RBC: 4.91 MIL/uL (ref 4.22–5.81)

## 2011-10-31 MED ORDER — AMOXICILLIN-POT CLAVULANATE 875-125 MG PO TABS: 1.0000 | ORAL_TABLET | Freq: Two times a day (BID) | ORAL | Status: AC

## 2011-10-31 MED ORDER — ATORVASTATIN CALCIUM 40 MG PO TABS: 40.0000 mg | ORAL_TABLET | Freq: Every day | ORAL | Status: DC

## 2011-10-31 MED ORDER — FENOFIBRATE 145 MG PO TABS: 145.0000 mg | ORAL_TABLET | Freq: Every day | ORAL | Status: DC

## 2011-10-31 MED ORDER — TERAZOSIN HCL 10 MG PO CAPS: 10.0000 mg | ORAL_CAPSULE | Freq: Every day | ORAL | Status: DC

## 2011-10-31 MED ORDER — AMLODIPINE BESYLATE 5 MG PO TABS: 5.0000 mg | ORAL_TABLET | Freq: Every day | ORAL | Status: DC

## 2011-10-31 MED ORDER — LOSARTAN POTASSIUM 100 MG PO TABS: 100.0000 mg | ORAL_TABLET | Freq: Every day | ORAL | Status: DC

## 2011-10-31 NOTE — Patient Instructions (Signed)
Please try the Bystolic samples instead of your metoprolol. Everything else should stay the same. Would like to see if this makes a difference as far as sexual function.

## 2011-10-31 NOTE — Progress Notes (Signed)
Subjective:    Patient ID: Joseph Berg, male    DOB: 1933-09-16, 78 y.o.   MRN: [id number]  HPI Stopped smoking a year ago. Says he feels he may have gained weight.  Though actually has lost 11 lbs in the last 4 months.  Having some night sweats. Sweats are waking him up at night. Started about 2 weeks ago. Had to change the sheets on his bed. No fever. Bilat foot swelling also started 2 weeks ago.  Worse on the left foot. Foot swelling was worse when they were elevated.  Says eye are swelling at night again. Has a yellow productive sputum cough for a few weeks. + nasal congettion with white mucous.  Says has noticed some urinary leaking at night a few times, again for the last 2 weeks.   Hypertension-no chest pain or shortness of breath. Taking medications regularly. In fact he was hoping to come off of something.  Review of Systems BP 122/80  Pulse 60  Wt 229 lb (103.874 kg)    Allergies  Allergen Reactions  . Hctz (Hydrochlorothiazide) Other (See Comments)    Affected renal function.     Past Medical History  Diagnosis Date  . Heart attack 1994    Past Surgical History  Procedure Date  . Vessel bypass 03-23-92  . Cataract extraction, bilateral     History   Social History  . Marital Status: Married    Spouse Name: N/A    Number of Children: N/A  . Years of Education: N/A   Occupational History  . Not on file.   Social History Main Topics  . Smoking status: Former Smoker -- 1.0 packs/day for 60 years    Quit date: 10/08/2010  . Smokeless tobacco: Not on file  . Alcohol Use: Yes  . Drug Use: No  . Sexually Active: Not on file     works part-time, [employer] completed H, married, no children, regular exercise.S   Other Topics Concern  . Not on file   Social History Narrative  . No narrative on file    Family History  Problem Relation Age of Onset  . Heart attack Father 50  . Stroke Brother 53  . Hypertension Brother   . Hyperlipidemia  Brother     Outpatient Encounter Prescriptions as of 10/31/2011  Medication Sig Dispense Refill  . amLODipine (NORVASC) 5 MG tablet Take 1 tablet (5 mg total) by mouth daily.  90 tablet  1  . aspirin 81 MG EC tablet Take by mouth daily.        [name] atorvastatin (LIPITOR) 40 MG tablet Take 1 tablet (40 mg total) by mouth daily.  90 tablet  3  . fenofibrate (TRICOR) 145 MG tablet Take 1 tablet (145 mg total) by mouth daily.  90 tablet  3  . losartan (COZAAR) 100 MG tablet Take 1 tablet (100 mg total) by mouth daily.  90 tablet  1  . metoprolol (LOPRESSOR) 100 MG tablet TAKE ONE TABLET BY MOUTH TWICE DAILY  180 tablet  2  . terazosin (HYTRIN) 10 MG capsule Take 1 capsule (10 mg total) by mouth at bedtime.  90 capsule  1  . DISCONTD: amLODipine (NORVASC) 5 MG tablet TAKE ONE TABLET BY MOUTH ONCE DAILY  30 tablet  1  . DISCONTD: atorvastatin (LIPITOR) 40 MG tablet TAKE ONE TABLET BY MOUTH NIGHTLY AT BEDTIME  90 tablet  2  . DISCONTD: fenofibrate (TRICOR) 145 MG tablet TAKE ONE TABLET BY MOUTH ONE  TIME DAILY  30 tablet  0  . DISCONTD: losartan (COZAAR) 100 MG tablet TAKE ONE TABLET BY MOUTH ONE TIME DAILY  30 tablet  0  . DISCONTD: terazosin (HYTRIN) 10 MG capsule TAKE ONE CAPSULE BY MOUTH NIGHTLY AT BEDTIME  30 capsule  1  . amoxicillin-clavulanate (AUGMENTIN) 875-125 MG per tablet Take 1 tablet by mouth 2 (two) times daily.  20 tablet  0           Objective:   Physical Exam  Constitutional: He is oriented to person, place, and time. He appears well-developed and well-nourished.  HENT:  Head: Normocephalic and atraumatic.  Eyes: Conjunctivae normal are normal. Pupils are equal, round, and reactive to light.  Neck: Neck supple. No thyromegaly present.  Cardiovascular: Normal rate, regular rhythm and normal heart sounds.   Pulmonary/Chest: Effort normal and breath sounds normal.  Abdominal: Soft. Bowel sounds are normal. He exhibits no distension and no mass. There is no tenderness. There is  no rebound and no guarding.       + obese.   Musculoskeletal: He exhibits edema.       1+ pitting edema along the tibia, below the knee. No foot swelling. DP pulse 2+.   Lymphadenopathy:    He has no cervical adenopathy.  Neurological: He is alert and oriented to person, place, and time.  Skin: Skin is warm and dry.  Psychiatric: He has a normal mood and affect. His behavior is normal.          Assessment & Plan:  HTN - well controlled today. Continue current regimen.  Sexual dysfunction - did have him fill out a form to bring him back in with him to address this further. Certainly it could be one of his blood pressure medications. I would say the beta blocker would be the most likely culprit. There is also having some urinary incontinence which is concerning for another pathologic process especially since it started more recently.  LE edema-unclear etiology-may be related to the sexual dysfunction urinary incontinence and recent night sweats. I would like to check some lab work including a CBC and CMP, also check a PSA. Also recheck TSH. He also has history hyperparathyroidism so we will check calcium level. Followup in 2 weeks.   Sinusitis/bronchitis-I. Would like to get a chest x-ray to make sure there is no sign of pneumonia especially with the night sweats. If it is normal or negative then we'll consider treating with an antibiotic for sinusitis as he has had some nasal congestion for 2-3 weeks at this point in time. Followup in 2 weeks.  Urianry incontinence. New onset. Urinalysis looks completely normal. We will check a PSA level. If it persists at followup in 2 weeks then refer to urology for further evaluation. Note he is a former smoker.

## 2011-11-01 LAB — COMPLETE METABOLIC PANEL WITH GFR
ALT: 23 U/L (ref 0–53)
Albumin: 4.3 g/dL (ref 3.5–5.2)
Alkaline Phosphatase: 29 U/L — ABNORMAL LOW (ref 39–117)
CO2: 28 mEq/L (ref 19–32)
Glucose, Bld: 97 mg/dL (ref 70–99)
Potassium: 4.3 mEq/L (ref 3.5–5.3)
Sodium: 141 mEq/L (ref 135–145)
Total Bilirubin: 0.7 mg/dL (ref 0.3–1.2)
Total Protein: 6.4 g/dL (ref 6.0–8.3)

## 2011-11-01 LAB — SEDIMENTATION RATE: Sed Rate: 14 mm/hr (ref 0–16)

## 2011-11-07 ENCOUNTER — Other Ambulatory Visit: Payer: Self-pay | Admitting: Family Medicine

## 2011-11-14 ENCOUNTER — Encounter: Payer: Self-pay | Admitting: Family Medicine

## 2011-11-14 ENCOUNTER — Ambulatory Visit (INDEPENDENT_AMBULATORY_CARE_PROVIDER_SITE_OTHER): Payer: BC Managed Care – PPO | Admitting: Family Medicine

## 2011-11-14 VITALS — BP 108/61 | HR 66 | Temp 98.4°F | Wt 243.0 lb

## 2011-11-14 DIAGNOSIS — R6882 Decreased libido: Secondary | ICD-10-CM

## 2011-11-14 DIAGNOSIS — R609 Edema, unspecified: Secondary | ICD-10-CM

## 2011-11-14 DIAGNOSIS — I10 Essential (primary) hypertension: Secondary | ICD-10-CM

## 2011-11-14 DIAGNOSIS — R5383 Other fatigue: Secondary | ICD-10-CM

## 2011-11-14 DIAGNOSIS — R600 Localized edema: Secondary | ICD-10-CM

## 2011-11-14 DIAGNOSIS — J329 Chronic sinusitis, unspecified: Secondary | ICD-10-CM

## 2011-11-14 DIAGNOSIS — Z23 Encounter for immunization: Secondary | ICD-10-CM

## 2011-11-14 MED ORDER — TADALAFIL 20 MG PO TABS: 20.0000 mg | ORAL_TABLET | Freq: Every day | ORAL | Status: DC | PRN

## 2011-11-14 MED ORDER — LEVOFLOXACIN 500 MG PO TABS: 500.0000 mg | ORAL_TABLET | Freq: Every day | ORAL | Status: DC

## 2011-11-14 NOTE — Addendum Note (Signed)
Addended by: Judie Petit A on: 11/14/2011 01:10 PM   Modules accepted: Orders

## 2011-11-14 NOTE — Progress Notes (Signed)
Subjective:    Patient ID: Joseph Berg, male    DOB: 08-24-1933, 78 y.o.   MRN: [id number]  HPI Sinusitis - did feel better on Augmentin,  but finished course 5 days ago and now feels worse agagin. He still has significant nasal congestion and says he is waking up every morning with his eyes together. They've been watery and crusty. He also has facial pressure and headache. He did complete the Augmentin. Note, the night sweats have resolved.  HTN- He has had a couple of lightheaded spells and says has been falling asleep very easily. He thinks his blood pressure may actually be going too low.  He is also having problems with erectile dysfunction like his testosterone checked. He did complete a form for me today. He did try Viagra in the past and said it did not work. He's not sure what this was.  Still concerned about lower Schimke edema. He says it really has not gotten better and is still fluctuating up and down. The left leg seems to be the worst.   Review of Systems     Objective:   Physical Exam  Constitutional: He is oriented to person, place, and time. He appears well-developed and well-nourished.  HENT:  Head: Normocephalic and atraumatic.  Right Ear: External ear normal.  Left Ear: External ear normal.  Nose: Nose normal.  Mouth/Throat: Oropharynx is clear and moist.       TMs and canals are clear.   Eyes: Conjunctivae normal and EOM are normal. Pupils are equal, round, and reactive to light.  Neck: Neck supple. No thyromegaly present.  Cardiovascular: Normal rate and normal heart sounds.        Radial Pulses 2+ bilaterally.   Pulmonary/Chest: Effort normal and breath sounds normal.  Musculoskeletal:       Trace edema on the right lower extremity. 1+ edema on the left lower Schimke about the ankle. No evidence of cellulitis or erythema.  Lymphadenopathy:    He has no cervical adenopathy.  Neurological: He is alert and oriented to person, place, and time.  Skin:  Skin is warm and dry.  Psychiatric: He has a normal mood and affect.          Assessment & Plan:  sinsutisi - unresolved. We will try treating him with Levaquin for 7 days. Followup in 2 weeks to make sure he significantly improved.  Hypertension-I. creatinine she may be having some hypotensive events. His blood pressure significantly low today. I would like him to stop the amlodipine because this could also be causing the lower extremity swelling. Though he is a little disappointed because he typically uses it for foot cramps. And says it has really made a big difference with this. I asked him to hold it for at least 2 weeks and we can see if the swelling is better off of the medication. If it is not then we can restart it and cut one of his other medications down. We can also make sure that his blood pressures back up to normal in 2 weeks.  Lower extremity swelling-his right ankle is back to baseline but his left distal some swollen. Still bothersome to him. We will hold the amlodipine as above for 2 weeks and see if that may be the culprit. If not to continue further workup.  Fatigue with low libido and erectile dissection-he tested positive for screening for low testosterone. Thus we'll check his testosterone levels today. His ED score was 9 which is significant  for erectile dysfunction. He is Re: tried Viagra and has failed. I gave him a coupon card to get 3 free tablets of Cialis to try as well. Also his AUA score was 8 which is considered moderate. He 32 on treatment for his prostate.

## 2011-11-14 NOTE — Patient Instructions (Signed)
Stop the amlodipine.  Complete the levaquin for your sinus infection.  We will call you with your lab results. If you don't here from [place] in about a week then please give [place] a call at [phone].

## 2011-11-18 LAB — TESTOSTERONE, FREE, TOTAL, SHBG: Testosterone, Free: 40.2 pg/mL — ABNORMAL LOW (ref 47.0–244.0)

## 2011-11-28 ENCOUNTER — Encounter: Payer: Self-pay | Admitting: Family Medicine

## 2011-11-28 ENCOUNTER — Ambulatory Visit (INDEPENDENT_AMBULATORY_CARE_PROVIDER_SITE_OTHER): Payer: BC Managed Care – PPO | Admitting: Family Medicine

## 2011-11-28 VITALS — BP 142/73 | HR 52 | Ht 68.0 in | Wt 245.0 lb

## 2011-11-28 DIAGNOSIS — J309 Allergic rhinitis, unspecified: Secondary | ICD-10-CM

## 2011-11-28 DIAGNOSIS — Z79899 Other long term (current) drug therapy: Secondary | ICD-10-CM

## 2011-11-28 DIAGNOSIS — N529 Male erectile dysfunction, unspecified: Secondary | ICD-10-CM

## 2011-11-28 DIAGNOSIS — E669 Obesity, unspecified: Secondary | ICD-10-CM

## 2011-11-28 DIAGNOSIS — E785 Hyperlipidemia, unspecified: Secondary | ICD-10-CM

## 2011-11-28 DIAGNOSIS — E8881 Metabolic syndrome: Secondary | ICD-10-CM

## 2011-11-28 DIAGNOSIS — R5383 Other fatigue: Secondary | ICD-10-CM

## 2011-11-28 DIAGNOSIS — R0683 Snoring: Secondary | ICD-10-CM

## 2011-11-28 DIAGNOSIS — I10 Essential (primary) hypertension: Secondary | ICD-10-CM

## 2011-11-28 MED ORDER — TADALAFIL 20 MG PO TABS: 20.0000 mg | ORAL_TABLET | Freq: Every day | ORAL | Status: DC | PRN

## 2011-11-28 MED ORDER — FLUTICASONE PROPIONATE 50 MCG/ACT NA SUSP: 2.0000 | Freq: Every day | NASAL | Status: DC

## 2011-11-28 MED ORDER — AMLODIPINE BESYLATE 2.5 MG PO TABS: 2.5000 mg | ORAL_TABLET | Freq: Every day | ORAL | Status: DC

## 2011-11-28 NOTE — Progress Notes (Signed)
  Subjective:    Patient ID: Joseph Berg, male    DOB: 01-14-1934, 78 y.o.   MRN: [id number]  HPI HTN - Still has ankle swelling.  No longer dizzy. No longer smoking.    Hyperlipidemia - Taking meds w/o S.E.   Sinusitis -No improvement after levaquin. Still productive cough with yellow sputum, worse at night.  Using netti pot. Watery eyes, sneezing.  Clear nasal discharge.  Peirod night sweats.  Says has been 6 months.    He snores a lot, excesive daytime sleepiness, and he is obese.  High risk of OSA.     Review of Systems     Objective:   Physical Exam  Constitutional: He is oriented to person, place, and time. He appears well-developed and well-nourished.  HENT:  Head: Normocephalic and atraumatic.  Right Ear: External ear normal.  Left Ear: External ear normal.  Nose: Nose normal.  Mouth/Throat: Oropharynx is clear and moist.       TMs and canals are clear.   Eyes: Conjunctivae normal and EOM are normal. Pupils are equal, round, and reactive to light.  Neck: Neck supple. No thyromegaly present.  Cardiovascular: Normal rate, regular rhythm and normal heart sounds.   Pulmonary/Chest: Effort normal and breath sounds normal.  Musculoskeletal:       1+ ankle edema bilatearlly  Lymphadenopathy:    He has no cervical adenopathy.  Neurological: He is alert and oriented to person, place, and time.  Skin: Skin is warm and dry.  Psychiatric: He has a normal mood and affect. His behavior is normal.          Assessment & Plan:  HTN - uncontrolled today. Had stopped his amlodpine last time bc of low BPs. Will restart at a lower dose.   Hyperlipidemia-Cont current regimen.   Sinusitis- Unresolved.  Consider AR since has failed 2 rounds of ABX.  Will start antihistamine and nasal steroid and if not better in one week then refer to ENT.    ED - Will resend her cialis - Reviewed how to use the medication.  He has a coupon for 3 free tabs.   IFG - Stable. Recheck in 6  months. Continue to work on diet and exercise.   Lab Results  Component Value Date   HGBA1C 5.9 11/28/2011     Snoring - Will refer for sleep study.   Night sweats - unclear etiology.  prostae and colon screening are up to date. CBC w/ diff was normal 4 weeks ago.  Not on meds that should be causing it.  Will continue to monitor it.

## 2011-11-28 NOTE — Patient Instructions (Addendum)
Place by either Zyrtec, Claritin or Allegra over-the-counter. Do not get the one with the decongestant. Zyrtec is a little sedating so if you choose that one can please take it at bedtime. Next Start a nasal steroid spray, fluticasone. One spray in each nostril daily. Can start pataday for eye, 1 drop in each eye.  Call me back if you're not better in 5-7 days and I will refer you to her nose and throat specialist.

## 2011-12-23 ENCOUNTER — Ambulatory Visit: Payer: BC Managed Care – PPO | Admitting: Family Medicine

## 2011-12-23 DIAGNOSIS — Z0289 Encounter for other administrative examinations: Secondary | ICD-10-CM

## 2011-12-29 ENCOUNTER — Telehealth: Payer: Self-pay | Admitting: Family Medicine

## 2011-12-29 ENCOUNTER — Encounter: Payer: Self-pay | Admitting: Family Medicine

## 2011-12-29 ENCOUNTER — Ambulatory Visit (INDEPENDENT_AMBULATORY_CARE_PROVIDER_SITE_OTHER): Payer: BC Managed Care – PPO | Admitting: Family Medicine

## 2011-12-29 VITALS — BP 133/74 | HR 64 | Ht 68.0 in | Wt 243.0 lb

## 2011-12-29 DIAGNOSIS — H04203 Unspecified epiphora, bilateral lacrimal glands: Secondary | ICD-10-CM

## 2011-12-29 DIAGNOSIS — R5383 Other fatigue: Secondary | ICD-10-CM

## 2011-12-29 DIAGNOSIS — H04209 Unspecified epiphora, unspecified lacrimal gland: Secondary | ICD-10-CM

## 2011-12-29 DIAGNOSIS — I10 Essential (primary) hypertension: Secondary | ICD-10-CM

## 2011-12-29 MED ORDER — AMLODIPINE BESYLATE 5 MG PO TABS: 5.0000 mg | ORAL_TABLET | Freq: Every day | ORAL | Status: DC

## 2011-12-29 NOTE — Telephone Encounter (Signed)
Please call patient and let him know that his apnea link results were abnormal. It is highly suspicious for sleep apnea. Currently medicine says they try to call you a couple different times to schedule an upper sleep study in a facility. If he is okay with scheduling and we can call them back and make sure they have the correct phone number etc. to contact him.

## 2011-12-29 NOTE — Telephone Encounter (Signed)
I will forward to [place].

## 2011-12-29 NOTE — Telephone Encounter (Signed)
Pt states both of his numbers are correct and has not heard from anyone re: this. Pt agrees to sleep study.

## 2011-12-29 NOTE — Patient Instructions (Signed)
We are calling for sleep study results and hopefully we will receive them in the next couple days and get in contact with you.

## 2011-12-29 NOTE — Progress Notes (Signed)
  Subjective:    Patient ID: Joseph Berg, male    DOB: 27-Aug-1933, 78 y.o.   MRN: [id number]  HPI HTN - Still occ ankle swelling but much better overall. He is back on his amlodipine. No CP or SOB.  We had stopped it at one time bc of low BP.  When I saw him at the last office visit his lower extremity swelling was better and we have restarted his amlodipine at 2.5 mg because his blood pressure was back up. He increased to 5 mg on his own and says he is doing well. He likes the medication because it does help with his foot cramps. He says his ankle swelling has been well controlled.  He's also asking about results of the sleep study. He went over a month ago has not heard back.  He continues to have problems with his eyes. He continued be itchy in the corners and very watery. To the point that sometimes is difficult for him to see because of the excessive watering. He does see Dr. August Luz here in town. Colostomy was seen was a year ago and he says he was given a sample prescription medications I have given him. It's really not getting any better.   Review of Systems     Objective:   Physical Exam  Constitutional: He is oriented to person, place, and time. He appears well-developed and well-nourished.  HENT:  Head: Normocephalic and atraumatic.  Cardiovascular: Normal rate, regular rhythm and normal heart sounds.   Pulmonary/Chest: Effort normal and breath sounds normal.  Neurological: He is alert and oriented to person, place, and time.  Skin: Skin is warm and dry.  Psychiatric: He has a normal mood and affect. His behavior is normal.          Assessment & Plan:  HTN - Back on 5mg  amlodipine on his own.  Well controlled. Continue current regimen. Followup in 4 months.  We will call, sleep medicines if need a copy of his sleep study. Unfortunately we have not received results either.  Watery itchy eyes-unclear if this is allergic or if he has some other type of tearing issue.  I would actually like to see an ophthalmologist. Recommend him to Dr. Marvia Pickles here in town, at [place] as surgeons.  ED- Need to repeat testosterone levels.  Go today. Given lab slip.

## 2012-01-09 ENCOUNTER — Encounter: Payer: Self-pay | Admitting: Family Medicine

## 2012-01-09 LAB — TESTOSTERONE, FREE, TOTAL, SHBG: Sex Hormone Binding: 89 nmol/L — ABNORMAL HIGH (ref 13–71)

## 2012-01-24 ENCOUNTER — Encounter: Payer: Self-pay | Admitting: Family Medicine

## 2012-01-24 DIAGNOSIS — G4733 Obstructive sleep apnea (adult) (pediatric): Secondary | ICD-10-CM | POA: Insufficient documentation

## 2012-03-19 ENCOUNTER — Encounter: Payer: Self-pay | Admitting: Family Medicine

## 2012-04-03 ENCOUNTER — Encounter: Payer: Self-pay | Admitting: Family Medicine

## 2012-04-27 ENCOUNTER — Encounter: Payer: Self-pay | Admitting: Family Medicine

## 2012-04-27 ENCOUNTER — Ambulatory Visit (INDEPENDENT_AMBULATORY_CARE_PROVIDER_SITE_OTHER): Payer: BC Managed Care – PPO | Admitting: Family Medicine

## 2012-04-27 VITALS — BP 130/74 | HR 61 | Ht 69.0 in | Wt 241.0 lb

## 2012-04-27 DIAGNOSIS — G4733 Obstructive sleep apnea (adult) (pediatric): Secondary | ICD-10-CM

## 2012-04-27 DIAGNOSIS — I10 Essential (primary) hypertension: Secondary | ICD-10-CM

## 2012-04-27 DIAGNOSIS — N400 Enlarged prostate without lower urinary tract symptoms: Secondary | ICD-10-CM

## 2012-04-27 LAB — BASIC METABOLIC PANEL WITH GFR
CO2: 29 mEq/L (ref 19–32)
Calcium: 9.5 mg/dL (ref 8.4–10.5)
GFR, Est African American: 69 mL/min
GFR, Est Non African American: 60 mL/min
Glucose, Bld: 109 mg/dL — ABNORMAL HIGH (ref 70–99)
Potassium: 4.2 mEq/L (ref 3.5–5.3)
Sodium: 143 mEq/L (ref 135–145)

## 2012-04-27 MED ORDER — FLUTICASONE PROPIONATE 50 MCG/ACT NA SUSP: 2.0000 | Freq: Every day | NASAL | Status: DC

## 2012-04-27 MED ORDER — FENOFIBRATE 145 MG PO TABS: 145.0000 mg | ORAL_TABLET | Freq: Every day | ORAL | Status: DC

## 2012-04-27 MED ORDER — ATORVASTATIN CALCIUM 40 MG PO TABS: 40.0000 mg | ORAL_TABLET | Freq: Every day | ORAL | Status: DC

## 2012-04-27 MED ORDER — TADALAFIL 20 MG PO TABS: 20.0000 mg | ORAL_TABLET | Freq: Every day | ORAL | Status: DC | PRN

## 2012-04-27 MED ORDER — DUTASTERIDE 0.5 MG PO CAPS: 0.5000 mg | ORAL_CAPSULE | Freq: Every day | ORAL | Status: DC

## 2012-04-27 MED ORDER — LOSARTAN POTASSIUM 100 MG PO TABS: 100.0000 mg | ORAL_TABLET | Freq: Every day | ORAL | Status: DC

## 2012-04-27 NOTE — Progress Notes (Signed)
  Subjective:    Patient ID: Joseph Berg, male    DOB: [birth year], 79 y.o.   MRN: [id number]  HPI  HTN -  Pt denies chest pain, SOB, dizziness, or heart palpitations.  Taking meds as directed w/o problems.  Denies medication side effects.    BPH- he's currently taking terazosin 10 mg. He wants and if there's anything stronger. He was fairly well controlled until recently and has now been urinating more frequently at night has been waking him up.  Sleep apnea-he really still continued to have difficulty wearing his CPAP. He says most mornings he wakes up and realizes it's lying on the floor and doesn't even remember taking it off. He went online and thought had for nasal gel pillows mask is interested in this. He wonders if he might be able to tolerate it better. He can't remember the supply company is currently using for his CPAP supplies but says will call back Thursday with that information.  Review of Systems     Objective:   Physical Exam  Constitutional: He is oriented to person, place, and time. He appears well-developed and well-nourished.  HENT:  Head: Normocephalic and atraumatic.  Cardiovascular: Normal rate, regular rhythm and normal heart sounds.   Pulmonary/Chest: Effort normal and breath sounds normal.  Musculoskeletal: He exhibits edema.  Trace bilateral lower extremity edema  Neurological: He is alert and oriented to person, place, and time.  Skin: Skin is warm and dry.  Psychiatric: He has a normal mood and affect. His behavior is normal.          Assessment & Plan:  HTN - well controlled. F/u in 6 months.    BPH- we will add Avodart to his terazosin for better control of his prostate symptoms. I asked him to come in a few weeks if he is not noticing some significant improvement. Otherwise if she's doing well follow up in 6 months. I did warn about potential side effects of the medication.  Sleep apnea-we will try her work with his current CPAP supply  company to see if he might be a candidate for nasal gel pillows. If this would increase his compliance and I think it will work that.

## 2012-04-27 NOTE — Progress Notes (Signed)
Quick Note:  All labs are normal. ______ 

## 2012-05-07 ENCOUNTER — Other Ambulatory Visit: Payer: Self-pay | Admitting: Family Medicine

## 2012-05-07 ENCOUNTER — Telehealth: Payer: Self-pay

## 2012-05-07 NOTE — Telephone Encounter (Signed)
His wife is calling to give the information for [employer] [phone]. Do I need to call and order nose pads?

## 2012-05-07 NOTE — Telephone Encounter (Signed)
Yes, please order nasal pillows mask for his CPAP instead of traditional mask.

## 2012-05-08 NOTE — Telephone Encounter (Signed)
Spoke with Jeanice Lim at [employer] and she states pt got mask on 3/15 and can not get a new one for insurance to cover it until September. She states patient had 21 days to return mask and not be charged for it and exchange but they did not retrun it. I asked her if they could get just nose pads for the mask he had and she said that they would have to pay out of pocket for them if wanted them. Called wife back and LMOM to return my call so I can inform her of this and she can call [facility] back directly ext. [phone] and work this out. Barry Dienes, LPN

## 2012-07-03 ENCOUNTER — Ambulatory Visit: Payer: BC Managed Care – PPO | Admitting: Family Medicine

## 2012-07-06 ENCOUNTER — Other Ambulatory Visit (HOSPITAL_BASED_OUTPATIENT_CLINIC_OR_DEPARTMENT_OTHER): Payer: Medicare PPO

## 2012-07-06 ENCOUNTER — Encounter (HOSPITAL_BASED_OUTPATIENT_CLINIC_OR_DEPARTMENT_OTHER): Payer: Self-pay | Admitting: *Deleted

## 2012-07-06 ENCOUNTER — Emergency Department (HOSPITAL_BASED_OUTPATIENT_CLINIC_OR_DEPARTMENT_OTHER)
Admission: EM | Admit: 2012-07-06 | Discharge: 2012-07-06 | Disposition: A | Discharge status: Home / Self Care | Payer: Medicare PPO | Attending: Emergency Medicine | Admitting: Emergency Medicine

## 2012-07-06 ENCOUNTER — Telehealth: Payer: Self-pay | Admitting: Family Medicine

## 2012-07-06 ENCOUNTER — Ambulatory Visit (INDEPENDENT_AMBULATORY_CARE_PROVIDER_SITE_OTHER): Payer: BC Managed Care – PPO | Admitting: Family Medicine

## 2012-07-06 ENCOUNTER — Emergency Department (HOSPITAL_BASED_OUTPATIENT_CLINIC_OR_DEPARTMENT_OTHER): Payer: Medicare PPO

## 2012-07-06 ENCOUNTER — Encounter: Payer: Self-pay | Admitting: Family Medicine

## 2012-07-06 ENCOUNTER — Ambulatory Visit (INDEPENDENT_AMBULATORY_CARE_PROVIDER_SITE_OTHER): Payer: Medicare PPO

## 2012-07-06 VITALS — BP 102/62 | HR 61 | Ht 69.0 in | Wt 243.0 lb

## 2012-07-06 DIAGNOSIS — I2510 Atherosclerotic heart disease of native coronary artery without angina pectoris: Secondary | ICD-10-CM | POA: Insufficient documentation

## 2012-07-06 DIAGNOSIS — R0602 Shortness of breath: Secondary | ICD-10-CM

## 2012-07-06 DIAGNOSIS — Z7982 Long term (current) use of aspirin: Secondary | ICD-10-CM | POA: Insufficient documentation

## 2012-07-06 DIAGNOSIS — Z9981 Dependence on supplemental oxygen: Secondary | ICD-10-CM | POA: Insufficient documentation

## 2012-07-06 DIAGNOSIS — I517 Cardiomegaly: Secondary | ICD-10-CM

## 2012-07-06 DIAGNOSIS — G4700 Insomnia, unspecified: Secondary | ICD-10-CM

## 2012-07-06 DIAGNOSIS — G4733 Obstructive sleep apnea (adult) (pediatric): Secondary | ICD-10-CM

## 2012-07-06 DIAGNOSIS — I10 Essential (primary) hypertension: Secondary | ICD-10-CM | POA: Insufficient documentation

## 2012-07-06 DIAGNOSIS — Z8674 Personal history of sudden cardiac arrest: Secondary | ICD-10-CM | POA: Insufficient documentation

## 2012-07-06 DIAGNOSIS — I509 Heart failure, unspecified: Secondary | ICD-10-CM | POA: Insufficient documentation

## 2012-07-06 DIAGNOSIS — M7989 Other specified soft tissue disorders: Secondary | ICD-10-CM | POA: Insufficient documentation

## 2012-07-06 DIAGNOSIS — Z87891 Personal history of nicotine dependence: Secondary | ICD-10-CM | POA: Insufficient documentation

## 2012-07-06 DIAGNOSIS — R609 Edema, unspecified: Secondary | ICD-10-CM | POA: Insufficient documentation

## 2012-07-06 DIAGNOSIS — I25810 Atherosclerosis of coronary artery bypass graft(s) without angina pectoris: Secondary | ICD-10-CM | POA: Insufficient documentation

## 2012-07-06 DIAGNOSIS — R0789 Other chest pain: Secondary | ICD-10-CM

## 2012-07-06 DIAGNOSIS — IMO0002 Reserved for concepts with insufficient information to code with codable children: Secondary | ICD-10-CM | POA: Insufficient documentation

## 2012-07-06 DIAGNOSIS — M25473 Effusion, unspecified ankle: Secondary | ICD-10-CM

## 2012-07-06 DIAGNOSIS — Z79899 Other long term (current) drug therapy: Secondary | ICD-10-CM | POA: Insufficient documentation

## 2012-07-06 HISTORY — DX: Obstructive sleep apnea (adult) (pediatric): G47.33

## 2012-07-06 HISTORY — DX: Atherosclerotic heart disease of native coronary artery without angina pectoris: I25.10

## 2012-07-06 HISTORY — DX: Essential (primary) hypertension: I10

## 2012-07-06 HISTORY — DX: Dependence on other enabling machines and devices: Z99.89

## 2012-07-06 LAB — CBC WITH DIFFERENTIAL/PLATELET
Eosinophils Relative: 1 % (ref 0–5)
HCT: 48 % (ref 39.0–52.0)
Lymphocytes Relative: 16 % (ref 12–46)
Lymphs Abs: 1.1 10*3/uL (ref 0.7–4.0)
MCV: 87 fL (ref 78.0–100.0)
Platelets: 239 10*3/uL (ref 150–400)
RBC: 5.52 MIL/uL (ref 4.22–5.81)
WBC: 6.8 10*3/uL (ref 4.0–10.5)

## 2012-07-06 LAB — COMPREHENSIVE METABOLIC PANEL
ALT: 28 U/L (ref 0–53)
AST: 28 U/L (ref 0–37)
Albumin: 3.9 g/dL (ref 3.5–5.2)
Alkaline Phosphatase: 35 U/L — ABNORMAL LOW (ref 39–117)
Potassium: 4.1 mEq/L (ref 3.5–5.1)
Sodium: 144 mEq/L (ref 135–145)
Total Protein: 7 g/dL (ref 6.0–8.3)

## 2012-07-06 LAB — D-DIMER, QUANTITATIVE: D-Dimer, Quant: 3.1 ug/mL-FEU — ABNORMAL HIGH (ref 0.00–0.48)

## 2012-07-06 MED ORDER — FUROSEMIDE 40 MG PO TABS: 40.0000 mg | ORAL_TABLET | Freq: Every day | ORAL | Status: DC

## 2012-07-06 MED ORDER — AMBULATORY NON FORMULARY MEDICATION: Status: DC

## 2012-07-06 MED ORDER — IOHEXOL 350 MG/ML SOLN
100.0000 mL | Freq: Once | INTRAVENOUS | Status: AC | PRN
  Administered 2012-07-06: 100 mL via INTRAVENOUS

## 2012-07-06 MED ORDER — FUROSEMIDE 10 MG/ML IJ SOLN
40.0000 mg | Freq: Once | INTRAMUSCULAR | Status: AC
  Administered 2012-07-06: 40 mg via INTRAMUSCULAR

## 2012-07-06 NOTE — ED Notes (Signed)
Pt ambulated to BR beside tx room

## 2012-07-06 NOTE — ED Provider Notes (Signed)
History     CSN: [id number]  Arrival date & time 07/06/12  1608   First MD Initiated Contact with Patient 07/06/12 1622      Chief Complaint  Patient presents with  . Shortness of Breath    (Consider location/radiation/quality/duration/timing/severity/associated sxs/prior treatment) HPI Comments: Patient with history of CAD with CABG 14 years ago.  Presents with complaints of shortness of breath, increased leg swelling for the past 2 months.  It is worse with exertion, better with rest.  There is no chest pain or discomfort.  No cough, fevers, or chills.  He went to his pcp this morning and had blood work performed.  They called him later and told him to come to the ED.  His D-Dimer is 3.1.  Patient is a 79 y.o. male presenting with shortness of breath. The history is provided by the patient.  Shortness of Breath Severity:  Moderate Onset quality:  Gradual Duration:  2 months Timing:  Constant Progression:  Worsening Chronicity:  New Context: activity   Relieved by:  Nothing Worsened by:  Activity and exertion Ineffective treatments:  None tried Associated symptoms: no abdominal pain, no chest pain, no cough and no fever     Past Medical History  Diagnosis Date  . Heart attack 1994  . Hypertension   . CAD (coronary artery disease)   . OSA on CPAP     Past Surgical History  Procedure Laterality Date  . Coronary artery bypass graft  03-23-92  . Cataract extraction, bilateral      Family History  Problem Relation Age of Onset  . Heart attack Father 74  . Stroke Brother 76  . Hypertension Brother   . Hyperlipidemia Brother     History  Substance Use Topics  . Smoking status: Former Smoker -- 1.00 packs/day for 60 years    Quit date: 10/08/2010  . Smokeless tobacco: Not on file  . Alcohol Use: Yes      Review of Systems  Constitutional: Negative for fever.  Respiratory: Positive for shortness of breath. Negative for cough.   Cardiovascular: Negative for  chest pain.  Gastrointestinal: Negative for abdominal pain.  All other systems reviewed and are negative.    Allergies  Hctz  Home Medications   Current Outpatient Rx  Name  Route  Sig  Dispense  Refill  . AMBULATORY NON FORMULARY MEDICATION      Medication Name: Change CPAP setting to auto titrate. We will try this for months to see if he is more comfortable. Please send airflow. He is having difficulty wearing a CPAP consistently.   1 Units   0   . AMBULATORY NON FORMULARY MEDICATION      Medication Name: 3 L of continuous oxygen. Diagnoses hypoxemia and shortness of breath. Oxygen saturation 80% on room air and with ambulation. 3 L of oxygen, oxygen saturation of 95% sitting and with stimulation.   1 vial   0   . amLODipine (NORVASC) 5 MG tablet   Oral   Take 1 tablet (5 mg total) by mouth daily.   1 tablet   0   . aspirin 81 MG EC tablet   Oral   Take by mouth daily.           [name] atorvastatin (LIPITOR) 40 MG tablet   Oral   Take 1 tablet (40 mg total) by mouth daily.   90 tablet   3   . dutasteride (AVODART) 0.5 MG capsule   Oral  Take 1 capsule (0.5 mg total) by mouth daily.   30 capsule   5   . fenofibrate (TRICOR) 145 MG tablet   Oral   Take 1 tablet (145 mg total) by mouth daily.   90 tablet   3   . fluticasone (FLONASE) 50 MCG/ACT nasal spray   Nasal   Place 2 sprays into the nose daily.   16 g   1   . losartan (COZAAR) 100 MG tablet   Oral   Take 1 tablet (100 mg total) by mouth daily.   90 tablet   1   . metoprolol (LOPRESSOR) 100 MG tablet      TAKE ONE TABLET BY MOUTH TWICE DAILY   180 tablet   0   . tadalafil (CIALIS) 20 MG tablet   Oral   Take 1 tablet (20 mg total) by mouth daily as needed for erectile dysfunction.   3 tablet   0   . terazosin (HYTRIN) 10 MG capsule      TAKE ONE CAPSULE BY MOUTH NIGHTLY AT BEDTIME   90 capsule   0     BP 136/69  Pulse 67  Temp(Src) 98.3 F (36.8 C) (Oral)  Resp 24  SpO2  99%  Physical Exam  Nursing note and vitals reviewed. Constitutional: He is oriented to person, place, and time. He appears well-developed and well-nourished. No distress.  HENT:  Head: Normocephalic and atraumatic.  Mouth/Throat: Oropharynx is clear and moist.  Neck: Normal range of motion. Neck supple.  Cardiovascular: Normal rate, regular rhythm and normal heart sounds.   Pulmonary/Chest: Effort normal. No respiratory distress. He has no wheezes. He has rales.  There are slight rales in the bases bilaterally.  Abdominal: Soft. Bowel sounds are normal. He exhibits no distension. There is no tenderness.  Musculoskeletal: Normal range of motion. He exhibits edema.  There is 2+ BLE edema noted.  The DP pulses are easily palpable bilaterally.  Neurological: He is alert and oriented to person, place, and time.  Skin: Skin is warm and dry. He is not diaphoretic.    ED Course  Procedures (including critical care time)  Labs Reviewed  COMPREHENSIVE METABOLIC PANEL  TROPONIN I  PRO B NATRIURETIC PEPTIDE   Dg Chest 2 View  07/06/2012   *RADIOLOGY REPORT*  Clinical Data: Shortness of breath with 02/03 weeks, former smoking history  CHEST - 2 VIEW  Comparison: Chest x-ray of 10/31/2011  Findings: Cardiomegaly is again noted.  There is question of very mild pulmonary vascular congestion.  No effusion is seen.  Median sternotomy sutures are noted from prior CABG.  No bony abnormality is seen.  IMPRESSION: Cardiomegaly.  Question minimal pulmonary vascular congestion.   Original Report Authenticated By: Dwyane Dee, M.D.     No diagnosis found.   Date: 07/06/2012  Rate: 64  Rhythm: normal sinus rhythm  QRS Axis: right  Intervals: normal  ST/T Wave abnormalities: nonspecific T wave changes  Conduction Disutrbances:right bundle branch block  Narrative Interpretation:   Old EKG Reviewed: none available    MDM  The patient presents with shortness of breath that appears to be related to  chf.  His bnp is elevated and there appears to be chf on the imaging studies.  He was sent here initially for possible pe as the d-dimer was elevated.  CT angio was negative.  Venous duplexes also revealed no dvt to explain for the elevated d-dimer.  As this has been going on for two months, I  feel it appropriate to manage as an outpatient.   Will discharge to home with a prescription for lasix.  He is to follow up with his pcp on Monday as scheduled.  Return prn if he worsens.          Geoffery Lyons, MD 07/06/12 2020

## 2012-07-06 NOTE — ED Notes (Signed)
Pt initial O2 Sat was 75% on RA taken on pts finger.  Probe was placed on pt right ear and SpO2 reading was 93-99%.  RN aware and MD at bedside.

## 2012-07-06 NOTE — ED Notes (Signed)
EDP [place] at [facility] 

## 2012-07-06 NOTE — ED Notes (Signed)
Pt was given "2 shots" of diuretic in PCP office today

## 2012-07-06 NOTE — Progress Notes (Signed)
Subjective:    Patient ID: Joseph Berg, male    DOB: May 22, 1933, 79 y.o.   MRN: [id number]  HPI Insomnia x 2-3 weeks - hasn't been able to wear CPAP. Says feels like the pressure is too high. Says took it in yesterday to be refitted. He says appetite down x 2-3 weeks.  Also notes SOB last couple of days.  No caffeine in the evenings.  No fever or cough or URI sxs. No sweats or chills. No chest pain. Started getting some mild ankle swelling again in the last month. Says it was better over the winter but history did come back now to whether Scott warmer. + fatigue.  Has been more irritable.  He iniitally denies any chest pain recently.  His wife reports that he mentioned his chest was hurting about 3 or 4 days ago. He says it has not hurt since then. He says only lasted a few minutes. Not sure where he was doing when it happened. Overall he is fairly sedentary. He says he is having shortness of breath eating just getting up and going to the bathroom and back. He says he is taking all his medications regularly. He says he is most concerned about his lack of sleep today. History of CABG 20 years ago. No prior history of congestive heart failure. He has not established with a new cardiologist since moving here about 5 years ago.   Review of Systems BP 102/62  Pulse 61  Ht 5\' 9"  (1.753 m)  Wt 243 lb (110.224 kg)  BMI 35.87 kg/m2  SpO2 95%    Allergies  Allergen Reactions  . Hctz (Hydrochlorothiazide) Other (See Comments)    Affected renal function.     Past Medical History  Diagnosis Date  . Heart attack 1994    Past Surgical History  Procedure Laterality Date  . Coronary artery bypass graft  03-23-92  . Cataract extraction, bilateral      History   Social History  . Marital Status: Married    Spouse Name: N/A    Number of Children: N/A  . Years of Education: N/A   Occupational History  . Not on file.   Social History Main Topics  . Smoking status: Former Smoker -- 1.00  packs/day for 60 years    Quit date: 10/08/2010  . Smokeless tobacco: Not on file  . Alcohol Use: Yes  . Drug Use: No  . Sexually Active: Not on file     Comment: works part-time, [employer] completed H, married, no children, regular exercise.S   Other Topics Concern  . Not on file   Social History Narrative  . No narrative on file    Family History  Problem Relation Age of Onset  . Heart attack Father 53  . Stroke Brother 30  . Hypertension Brother   . Hyperlipidemia Brother     Outpatient Encounter Prescriptions as of 07/06/2012  Medication Sig Dispense Refill  . amLODipine (NORVASC) 5 MG tablet Take 1 tablet (5 mg total) by mouth daily.  1 tablet  0  . aspirin 81 MG EC tablet Take by mouth daily.        [name] atorvastatin (LIPITOR) 40 MG tablet Take 1 tablet (40 mg total) by mouth daily.  90 tablet  3  . dutasteride (AVODART) 0.5 MG capsule Take 1 capsule (0.5 mg total) by mouth daily.  30 capsule  5  . fenofibrate (TRICOR) 145 MG tablet Take 1 tablet (145 mg total) by mouth  daily.  90 tablet  3  . fluticasone (FLONASE) 50 MCG/ACT nasal spray Place 2 sprays into the nose daily.  16 g  1  . losartan (COZAAR) 100 MG tablet Take 1 tablet (100 mg total) by mouth daily.  90 tablet  1  . metoprolol (LOPRESSOR) 100 MG tablet TAKE ONE TABLET BY MOUTH TWICE DAILY  180 tablet  0  . tadalafil (CIALIS) 20 MG tablet Take 1 tablet (20 mg total) by mouth daily as needed for erectile dysfunction.  3 tablet  0  . terazosin (HYTRIN) 10 MG capsule TAKE ONE CAPSULE BY MOUTH NIGHTLY AT BEDTIME  90 capsule  0   Facility-Administered Encounter Medications as of 07/06/2012  Medication Dose Route Frequency Provider Last Rate Last Dose  . furosemide (LASIX) injection 40 mg  40 mg Intramuscular Once Agapito Games, MD              Objective:   Physical Exam  Constitutional: He is oriented to person, place, and time. He appears well-developed and well-nourished.  HENT:  Head:  Normocephalic and atraumatic.  Right Ear: External ear normal.  Left Ear: External ear normal.  Nose: Nose normal.  Mouth/Throat: Oropharynx is clear and moist.  TMs and canals are clear.   Eyes: Conjunctivae and EOM are normal. Pupils are equal, round, and reactive to light.  Neck: Neck supple. No thyromegaly present.  Cardiovascular: Normal rate and normal heart sounds.   Pulmonary/Chest: Effort normal and breath sounds normal.  Crackles at the RLL  Musculoskeletal: He exhibits edema.  2+ pitting edema on the ankles and the feet. 1+ pitting edema up to the knees. Bilateral.  Lymphadenopathy:    He has no cervical adenopathy.  Neurological: He is alert and oriented to person, place, and time.  Skin: Skin is warm and dry.  Psychiatric: He has a normal mood and affect.          Assessment & Plan:  Insomnia - am wondering if his sleep difficulty has to do with increased shortness of breath. Right now on very concerned about his pulse ox being low today I really want to put this on this. I certainly do not want to give him any type of sedative drugs for sleep until we know what's going on with his respiratory function.  Shortness of breath-his pulse ox on arrival today was 80%. He has been feeling short of breath a little bit tired but no other cough or respiratory symptoms. We did send down for chest x-ray which showed some pulmonary congestion. Because of this and some increased ankle swelling over the last few weeks I went ahead and gave him Lasix 40 mg IM. Minimize fluid intake today. We'll see if this improves his breathing. We also contacted his home health company through which he gets his sleep apnea supplies. They will set him up for home oxygen and come out this afternoon to get him set up for continuous oxygen set at 2-3 L. I did order a stat d-dimer that he has not had any chest pain. Also we'll check a CBC for anemia as well as thyroid and check a BNP to evaluate for congestive  heart failure. He does have a history of coronary artery disease status post CABG. He has not been to a cardiologist since he's been here in the last 5 years. He quit smoking about 2 years ago.   CABG with coronary artery disease-we will get him established with a local cardiologist. He does have  changes on his EKG. They do not appear to be acutely does have new development of right bundle branch block with inverted T waves in leads 3, V2, V6 that were not present on EKG done in 2010. He quit smoking 2 years ago.  HTN - BP a little low today. Hold hytrin this evening for now.  Check kidney function.   OSA - Using [employer] and will call try the autotitrate setting. Will send over order. He REALLY needs to be using this.    EKG shows rate of 57 beats per minute with , normal sinus rhythm, normal axis, and right bundle branch block. Inverted T wave in lead 3, V2 and V3.  Time spent one hour, greater than 50% time spent counseling about his shortness of breath, heart disease, and sleep disorder, and sleep apnea

## 2012-07-06 NOTE — Telephone Encounter (Signed)
Call patient: Please hold Hytrin this evening since his blood pressure was a little bit low. That medication can lower blood pressure even more.

## 2012-07-06 NOTE — Telephone Encounter (Signed)
Call pt: Hold hytrin tonight since BP was a little low today.

## 2012-07-06 NOTE — ED Notes (Signed)
Pt amb to triage with quick steady gait in nad. Pt states he went to see his pcp today for shortness of breath and was called later and told his bloodwork showed that "he could have a clot".  Pt states he still feels sob.

## 2012-07-07 LAB — CK TOTAL AND CKMB (NOT AT ARMC): CK, MB: 1.9 ng/mL (ref 0.3–4.0)

## 2012-07-09 ENCOUNTER — Encounter: Payer: Self-pay | Admitting: *Deleted

## 2012-07-09 ENCOUNTER — Ambulatory Visit (INDEPENDENT_AMBULATORY_CARE_PROVIDER_SITE_OTHER): Payer: Medicare PPO | Admitting: Family Medicine

## 2012-07-09 VITALS — BP 117/64 | HR 61 | Wt 233.0 lb

## 2012-07-09 DIAGNOSIS — R0602 Shortness of breath: Secondary | ICD-10-CM

## 2012-07-09 DIAGNOSIS — I25810 Atherosclerosis of coronary artery bypass graft(s) without angina pectoris: Secondary | ICD-10-CM

## 2012-07-09 DIAGNOSIS — R7981 Abnormal blood-gas level: Secondary | ICD-10-CM

## 2012-07-09 DIAGNOSIS — J810 Acute pulmonary edema: Secondary | ICD-10-CM

## 2012-07-09 NOTE — Telephone Encounter (Signed)
Called and reminded pt's wife to hold hytrin due making bp go low.Loralee Pacas [place]

## 2012-07-09 NOTE — Progress Notes (Signed)
  Subjective:    Patient ID: Joseph Berg, male    DOB: 1933-07-04, 79 y.o.   MRN: [id number]  HPI Patient was seen last Friday with significant shortness of breath. He originally had made the appointment to discuss insomnia but while here mentions he had been a little short of breath. His pulse ox was down to 80%. He denies any chest pain at that point time had noted that his ankles have been swelling a little but more recently. His appetite was also down. Initially we tried to manage him at home by getting home health deliver oxygen to his house the afternoon as well as giving him a Lasix injection after seeing some pulmonary congestion on his chest x-ray. Unfortunately, the stat d-dimer came back elevated and so we had to send him to the emergency department. They did do a chest CT which was negative for pulmonary embolus and they started him on oral Lasix daily and told to followup today. Lasix 40mg  daily. Got new CPAP today.  Has appt on Wednesday with cardiology.  He does have a history coronary artery disease with bypass and has not seen a cardiologist and is 20 years.   Review of Systems     Objective:   Physical Exam  Constitutional: He is oriented to person, place, and time. He appears well-developed and well-nourished.  HENT:  Head: Normocephalic and atraumatic.  Cardiovascular: Normal rate, regular rhythm and normal heart sounds.   Pulmonary/Chest: Effort normal and breath sounds normal.  Neurological: He is alert and oriented to person, place, and time.  Skin: Skin is warm and dry.  Psychiatric: He has a normal mood and affect. His behavior is normal.          Assessment & Plan:  Pulmonary congestion-continue Lasix 40 mg daily. We will check a B MP to make sure that he does not need additional potassium with the Lasix. Also make sure that we are not causing dehydration to his kidneys. Today blood pressure is well regulated but I am worried that it will continue to drop.  He is Re: down 10 pounds. He still has some pitting edema in both lower extremities. Go ahead and cut amlodipine in half for now. We will need to determine if he will take the Lasix daily or if he is able to take it on a less frequent basis such as as needed or every other day. I will call him with the results back. I would like to see him back on Friday to make sure that he is still doing well and responding well to medication without side effects. He does have an appointment on Wednesday with cardiology so if they decide they want to take over his care at this point for this acute issues and that is perfectly fine as well and he can call to cancel the appointment. Continue using home oxygen. Call if suddenly gets worse. I'm very glad he did not have a pulmonary embolism.

## 2012-07-09 NOTE — Patient Instructions (Signed)
Cut your amlodipine in half.   

## 2012-07-10 ENCOUNTER — Telehealth: Payer: Self-pay | Admitting: Family Medicine

## 2012-07-10 ENCOUNTER — Encounter: Payer: Self-pay | Admitting: *Deleted

## 2012-07-10 LAB — BASIC METABOLIC PANEL WITH GFR
BUN: 26 mg/dL — ABNORMAL HIGH (ref 6–23)
CO2: 36 mEq/L — ABNORMAL HIGH (ref 19–32)
Chloride: 96 mEq/L (ref 96–112)
GFR, Est Non African American: 58 mL/min — ABNORMAL LOW
Glucose, Bld: 86 mg/dL (ref 70–99)
Potassium: 3.8 mEq/L (ref 3.5–5.3)

## 2012-07-11 ENCOUNTER — Encounter: Payer: Self-pay | Admitting: Cardiology

## 2012-07-11 ENCOUNTER — Ambulatory Visit (INDEPENDENT_AMBULATORY_CARE_PROVIDER_SITE_OTHER): Payer: Medicare PPO | Admitting: Cardiology

## 2012-07-11 ENCOUNTER — Ambulatory Visit: Payer: Medicare PPO | Admitting: Cardiology

## 2012-07-11 VITALS — BP 118/70 | HR 64 | Ht 69.0 in | Wt 224.0 lb

## 2012-07-11 DIAGNOSIS — I10 Essential (primary) hypertension: Secondary | ICD-10-CM

## 2012-07-11 DIAGNOSIS — R0600 Dyspnea, unspecified: Secondary | ICD-10-CM

## 2012-07-11 DIAGNOSIS — I5022 Chronic systolic (congestive) heart failure: Secondary | ICD-10-CM | POA: Insufficient documentation

## 2012-07-11 DIAGNOSIS — R0609 Other forms of dyspnea: Secondary | ICD-10-CM

## 2012-07-11 DIAGNOSIS — I25810 Atherosclerosis of coronary artery bypass graft(s) without angina pectoris: Secondary | ICD-10-CM

## 2012-07-11 DIAGNOSIS — E785 Hyperlipidemia, unspecified: Secondary | ICD-10-CM

## 2012-07-11 NOTE — Assessment & Plan Note (Signed)
Continue statin. 

## 2012-07-11 NOTE — Assessment & Plan Note (Signed)
Continue aspirin and statin. 

## 2012-07-11 NOTE — Patient Instructions (Addendum)
Your physician recommends that you schedule a follow-up appointment in: 2 WEEKS WITH DR [name]  Your physician has requested that you have an echocardiogram. Echocardiography is a painless test that uses sound waves to create images of your heart. It provides your doctor with information about the size and shape of your heart and how well your heart's chambers and valves are working. This procedure takes approximately one hour. There are no restrictions for this procedure.

## 2012-07-11 NOTE — Assessment & Plan Note (Signed)
Blood pressure controlled. Continue present medications. 

## 2012-07-11 NOTE — Assessment & Plan Note (Signed)
Patient describes symptoms of congestive heart failure over the past one to 2 years. His symptoms have markedly improved with Lasix. We will continue with present dose. His symptoms sound potentially to be more right-sided than left-sided. He has a 200 pack year history of tobacco use and certainly pulmonary hypertension related to COPD could be causing his volume excess. I will check an echocardiogram. Continue ARB and beta blocker. I will see him back in 2 weeks. As he continues to improve we will schedule a right and left heart catheterization. We will make further adjustments in his medications based on left ventricular function and right ventricular function as well as pulmonary pressures.

## 2012-07-11 NOTE — Progress Notes (Signed)
HPI: 79 year-old male for evaluation of CHF. Patient had a myocardial infarction followed by coronary artery bypassing graft in 1994 in [place]. He has had no cardiology followup since that time. Over the past 2 years he has noted progressive increasing pedal edema. Over the past one year he has had progressive dyspnea on exertion. There is no orthopnea, PND, chest pain or syncope. He was seen by primary care recently for increasing dyspnea and laboratories were performed revealing BNP 328, d-dimer 3.10, normal TSH and hemoglobin. BUN and creatinine 32 and 1.30 and potassium 4.1. Albumin 3.9. Venous Dopplers in May of 2014 showed no DVT. CTA in May of 2014 showed no pulmonary embolus. There was a small right pleural effusion and perihepatic ascites. There was moderate emphysema. Patient was started on Lasix and has markedly improved with decreased edema and dyspnea. He was also placed on home oxygen. Cardiology is asked to evaluate.  Current Outpatient Prescriptions  Medication Sig Dispense Refill  . AMBULATORY NON FORMULARY MEDICATION Medication Name: Change CPAP setting to auto titrate. We will try this for months to see if he is more comfortable. Please send airflow. He is having difficulty wearing a CPAP consistently.  1 Units  0  . AMBULATORY NON FORMULARY MEDICATION Medication Name: 3 L of continuous oxygen. Diagnoses hypoxemia and shortness of breath. Oxygen saturation 80% on room air and with ambulation. 3 L of oxygen, oxygen saturation of 95% sitting and with stimulation.  1 vial  0  . aspirin 81 MG EC tablet Take by mouth daily.        [name] atorvastatin (LIPITOR) 40 MG tablet Take 1 tablet (40 mg total) by mouth daily.  90 tablet  3  . dutasteride (AVODART) 0.5 MG capsule Take 1 capsule (0.5 mg total) by mouth daily.  30 capsule  5  . fenofibrate (TRICOR) 145 MG tablet Take 1 tablet (145 mg total) by mouth daily.  90 tablet  3  . fluticasone (FLONASE) 50 MCG/ACT nasal spray Place 2 sprays into  the nose daily.  16 g  1  . furosemide (LASIX) 40 MG tablet Take 1 tablet (40 mg total) by mouth daily.  15 tablet  0  . losartan (COZAAR) 100 MG tablet Take 1 tablet (100 mg total) by mouth daily.  90 tablet  1  . metoprolol (LOPRESSOR) 100 MG tablet TAKE ONE TABLET BY MOUTH TWICE DAILY  180 tablet  0  . tadalafil (CIALIS) 20 MG tablet Take 1 tablet (20 mg total) by mouth daily as needed for erectile dysfunction.  3 tablet  0  . [DISCONTINUED] metoprolol (TOPROL-XL) 100 MG 24 hr tablet Take 100 mg by mouth 2 (two) times daily.         No current facility-administered medications for this visit.    Allergies  Allergen Reactions  . Hctz (Hydrochlorothiazide) Other (See Comments)    Affected renal function.     Past Medical History  Diagnosis Date  . Heart attack 1994  . Hypertension   . CAD (coronary artery disease)   . OSA on CPAP   . Sciatica     Qualifier: Diagnosis of  By: Linford Arnold MD, Santina Evans    . Hypoparathyroidism   . BPH (benign prostatic hyperplasia)   . Hyperlipidemia     Past Surgical History  Procedure Laterality Date  . Coronary artery bypass graft  03-23-92  . Cataract extraction, bilateral      History   Social History  . Marital Status: Married  Spouse Name: N/A    Number of Children: N/A  . Years of Education: N/A   Occupational History  . Not on file.   Social History Main Topics  . Smoking status: Former Smoker -- 1.00 packs/day for 60 years    Quit date: 10/08/2010  . Smokeless tobacco: Not on file  . Alcohol Use: Yes     Comment: Occasional  . Drug Use: No  . Sexually Active: Not on file     Comment: works part-time, [employer] completed H, married, no children, regular exercise.S   Other Topics Concern  . Not on file   Social History Narrative  . No narrative on file    Family History  Problem Relation Age of Onset  . Heart attack Father 87  . Stroke Brother 1  . Hypertension Brother   . Hyperlipidemia Brother      ROS: no fevers or chills, productive cough, hemoptysis, dysphasia, odynophagia, melena, hematochezia, dysuria, hematuria, rash, seizure activity, orthopnea, PND, pedal edema, claudication. Remaining systems are negative.  Physical Exam:   Blood pressure 118/70, pulse 64, height 5\' 9"  (1.753 m), weight 224 lb (101.606 kg).  General:  Well developed/well nourished in NAD Skin warm/dry Patient not depressed No peripheral clubbing Back-normal HEENT-normal/normal eyelids Neck supple/normal carotid upstroke bilaterally; no bruits; no JVD; no thyromegaly chest - diminished breath sounds. CV - RRR/normal S1 and S2; no murmurs, rubs or gallops;  PMI nondisplaced Abdomen -NT/ND, no HSM, no mass, + bowel sounds, no bruit 2+ femoral pulses, no bruits Ext-1+ edema, no chords Neuro-grossly nonfocal  ECG Sinus rhythm, RBBB, nonspecific ST changes

## 2012-07-12 ENCOUNTER — Ambulatory Visit (HOSPITAL_COMMUNITY): Payer: Medicare PPO | Attending: Internal Medicine | Admitting: Radiology

## 2012-07-12 DIAGNOSIS — R609 Edema, unspecified: Secondary | ICD-10-CM | POA: Insufficient documentation

## 2012-07-12 DIAGNOSIS — R0602 Shortness of breath: Secondary | ICD-10-CM

## 2012-07-12 DIAGNOSIS — I10 Essential (primary) hypertension: Secondary | ICD-10-CM | POA: Insufficient documentation

## 2012-07-12 DIAGNOSIS — R0600 Dyspnea, unspecified: Secondary | ICD-10-CM

## 2012-07-12 DIAGNOSIS — R0989 Other specified symptoms and signs involving the circulatory and respiratory systems: Secondary | ICD-10-CM | POA: Insufficient documentation

## 2012-07-12 DIAGNOSIS — R0609 Other forms of dyspnea: Secondary | ICD-10-CM | POA: Insufficient documentation

## 2012-07-12 DIAGNOSIS — E785 Hyperlipidemia, unspecified: Secondary | ICD-10-CM | POA: Insufficient documentation

## 2012-07-12 DIAGNOSIS — E669 Obesity, unspecified: Secondary | ICD-10-CM | POA: Insufficient documentation

## 2012-07-12 NOTE — Progress Notes (Signed)
Echocardiogram performed.  

## 2012-07-13 ENCOUNTER — Ambulatory Visit (INDEPENDENT_AMBULATORY_CARE_PROVIDER_SITE_OTHER): Payer: Medicare PPO | Admitting: Family Medicine

## 2012-07-13 ENCOUNTER — Encounter: Payer: Self-pay | Admitting: Family Medicine

## 2012-07-13 VITALS — BP 115/68 | HR 68 | Wt 224.0 lb

## 2012-07-13 DIAGNOSIS — I5041 Acute combined systolic (congestive) and diastolic (congestive) heart failure: Secondary | ICD-10-CM

## 2012-07-13 DIAGNOSIS — I509 Heart failure, unspecified: Secondary | ICD-10-CM

## 2012-07-13 DIAGNOSIS — I25810 Atherosclerosis of coronary artery bypass graft(s) without angina pectoris: Secondary | ICD-10-CM

## 2012-07-13 NOTE — Progress Notes (Signed)
  Subjective:    Patient ID: Joseph Berg, male    DOB: 13-Jul-1933, 79 y.o.   MRN: [id number]  HPI CHF - new dx.  Current hx of CAD. Saw Dr. Jens Berg 2 days ago.  Told to continue Lasix.  tolerating lasix well. No SE so far.  He is feeling less SOB.  Still really fatigued  No CP.  On it for a week now. He is down 9 lbs today from a week ago.  Had his echo yesterday. Has not really been using his oxygen that we ordered for him.  Did't bring it with him today. Says he is too busy to wear it. Wife says his appetite has been better.     Review of Systems     Objective:   Physical Exam  Constitutional: He is oriented to person, place, and time. He appears well-developed and well-nourished.  HENT:  Head: Normocephalic and atraumatic.  Neck: Neck supple. No thyromegaly present.  Cardiovascular: Normal rate, regular rhythm and normal heart sounds.   Pulmonary/Chest: Effort normal and breath sounds normal.  Musculoskeletal: He exhibits edema.  1+ edema on left ankle, trace on the right.   Lymphadenopathy:    He has no cervical adenopathy.  Neurological: He is alert and oriented to person, place, and time.  Skin: Skin is warm and dry.  Psychiatric: He has a normal mood and affect. His behavior is normal.          Assessment & Plan:  CHF- Has f/u with cards in 2 weeks. They are planning heart cath. Will check BMP today and see if need to add potassium or adjust dose on lasix.  Pulse ox improved today.  No sign drop with ambulation today. May be able to d/c oxygen soon. Encouragd him to weigh daily. If up more than 2 lbs overnight then call the office. Minimize salt intake. Monitor fluid intake.  Avoid excessive intake.   CAD- They are going to set him up for heart cath in near future.   On BB, ARB, statin, statin.

## 2012-07-14 LAB — BASIC METABOLIC PANEL WITHOUT GFR
BUN: 17 mg/dL (ref 6–23)
CO2: 34 meq/L — ABNORMAL HIGH (ref 19–32)
Calcium: 10.5 mg/dL (ref 8.4–10.5)
Chloride: 97 meq/L (ref 96–112)
Creat: 1.22 mg/dL (ref 0.50–1.35)
GFR, Est African American: 66 mL/min
GFR, Est Non African American: 57 mL/min — ABNORMAL LOW
Glucose, Bld: 129 mg/dL — ABNORMAL HIGH (ref 70–99)
Potassium: 3.5 meq/L (ref 3.5–5.3)
Sodium: 141 meq/L (ref 135–145)

## 2012-07-14 NOTE — Progress Notes (Signed)
Quick Note:  All labs are normal. ______ 

## 2012-07-23 ENCOUNTER — Encounter: Payer: Self-pay | Admitting: Physician Assistant

## 2012-07-23 ENCOUNTER — Ambulatory Visit (INDEPENDENT_AMBULATORY_CARE_PROVIDER_SITE_OTHER): Payer: BC Managed Care – PPO | Admitting: Physician Assistant

## 2012-07-23 VITALS — BP 65/42 | HR 72 | Wt 221.0 lb

## 2012-07-23 DIAGNOSIS — R51 Headache: Secondary | ICD-10-CM

## 2012-07-23 DIAGNOSIS — I959 Hypotension, unspecified: Secondary | ICD-10-CM

## 2012-07-23 DIAGNOSIS — R42 Dizziness and giddiness: Secondary | ICD-10-CM

## 2012-07-23 NOTE — Patient Instructions (Addendum)
Cut in half lasix and losartan. Check bp on Friday.

## 2012-07-23 NOTE — Progress Notes (Signed)
  Subjective:    Patient ID: Joseph Berg, male    DOB: [birth year], 79 y.o.   MRN: [id number]  HPI Patient is a 79 yo male who presents to the clinic with his wife with 4 days of feeling off. Denies any upper respiratory symptoms. No ST, ear pain, fever, chills, dysuria, n/v/d. He has been very dizzy to the point of almost feeling like she is going to pass out but has not. He continues to have headache but only with standing. Sitting it would go away. Headache is 7/10 and sharp when standing. Has not tried anything to make better.     Review of Systems     Objective:   Physical Exam  Constitutional: He is oriented to person, place, and time. He appears well-developed.  HENT:  Head: Normocephalic and atraumatic.  Eyes: Conjunctivae and EOM are normal. Pupils are equal, round, and reactive to light.  Cardiovascular: Normal rate, regular rhythm and normal heart sounds.   Pulmonary/Chest: Effort normal and breath sounds normal. He has no wheezes.  Neurological: He is alert and oriented to person, place, and time.  Skin: Skin is warm and dry.  No ankle edema today.  Psychiatric: He has a normal mood and affect. His behavior is normal.          Assessment & Plan:  Dizziness/hypotension/Headache- BP is extremely low when standing drops to 65/42. This could most definitely contribute to dizziness and potentially headache. BMP was done 1 week ago and good. I would like for pt to cut lasix and losaartan in half and see if that helps with symptoms. Could try NSAID for HA control. I hope with regulation of BP HA resolves. I would like to recheck BP on Friday. At that point will consider changing lasix back up.

## 2012-07-25 ENCOUNTER — Ambulatory Visit (INDEPENDENT_AMBULATORY_CARE_PROVIDER_SITE_OTHER): Payer: Medicare PPO | Admitting: Cardiology

## 2012-07-25 ENCOUNTER — Encounter: Payer: Self-pay | Admitting: Cardiology

## 2012-07-25 VITALS — BP 130/72 | HR 70 | Ht 69.0 in | Wt 221.0 lb

## 2012-07-25 DIAGNOSIS — I10 Essential (primary) hypertension: Secondary | ICD-10-CM

## 2012-07-25 DIAGNOSIS — I2510 Atherosclerotic heart disease of native coronary artery without angina pectoris: Secondary | ICD-10-CM

## 2012-07-25 DIAGNOSIS — I2589 Other forms of chronic ischemic heart disease: Secondary | ICD-10-CM

## 2012-07-25 DIAGNOSIS — I25810 Atherosclerosis of coronary artery bypass graft(s) without angina pectoris: Secondary | ICD-10-CM

## 2012-07-25 DIAGNOSIS — I255 Ischemic cardiomyopathy: Secondary | ICD-10-CM

## 2012-07-25 DIAGNOSIS — E785 Hyperlipidemia, unspecified: Secondary | ICD-10-CM

## 2012-07-25 MED ORDER — CARVEDILOL 12.5 MG PO TABS: 12.5000 mg | ORAL_TABLET | Freq: Two times a day (BID) | ORAL | Status: DC

## 2012-07-25 NOTE — Progress Notes (Signed)
HPI: Pleasant male for fu of CHF. Patient had a myocardial infarction followed by coronary artery bypassing graft in 1994 in [place]. He had no cardiology followup since that time until I saw him in June of 2014. The patient described 2 years of progressive CHF symptoms. He was seen by primary care and laboratories were performed revealing BNP 328, d-dimer 3.10, normal TSH and hemoglobin. BUN and creatinine 32 and 1.30 and potassium 4.1. Albumin 3.9. Venous Dopplers in May of 2014 showed no DVT. CTA in May of 2014 showed no pulmonary embolus. There was a small right pleural effusion and perihepatic ascites. There was moderate emphysema. Patient was started on Lasix and home O2. Symptoms gradually improving. Echo in June of 2014 showed an ejection fraction of 35-40%. There was mild left ventricular hypertrophy. Right ventricular function was mildly reduced. The patient was seen by primary care earlier this week for dizziness. His Lasix was decreased as was his Cozaar and he feels better. His dyspnea on exertion has improved. He denies orthopnea, PND or chest pain. His pedal edema has markedly improved.   Current Outpatient Prescriptions  Medication Sig Dispense Refill  . aspirin 81 MG EC tablet Take by mouth daily.        [name] atorvastatin (LIPITOR) 40 MG tablet Take 1 tablet (40 mg total) by mouth daily.  90 tablet  3  . dutasteride (AVODART) 0.5 MG capsule Take 1 capsule (0.5 mg total) by mouth daily.  30 capsule  5  . fenofibrate (TRICOR) 145 MG tablet Take 1 tablet (145 mg total) by mouth daily.  90 tablet  3  . fluticasone (FLONASE) 50 MCG/ACT nasal spray Place 2 sprays into the nose daily.  16 g  1  . furosemide (LASIX) 40 MG tablet Take 20 mg by mouth daily.      [name] losartan (COZAAR) 100 MG tablet Take 50 mg by mouth daily.      . metoprolol (LOPRESSOR) 100 MG tablet TAKE ONE TABLET BY MOUTH TWICE DAILY  180 tablet  0  . tadalafil (CIALIS) 20 MG tablet Take 1 tablet (20 mg total) by mouth  daily as needed for erectile dysfunction.  3 tablet  0  . [DISCONTINUED] metoprolol (TOPROL-XL) 100 MG 24 hr tablet Take 100 mg by mouth 2 (two) times daily.         No current facility-administered medications for this visit.     Past Medical History  Diagnosis Date  . Heart attack 1994  . Hypertension   . CAD (coronary artery disease)   . OSA on CPAP   . Sciatica     Qualifier: Diagnosis of  By: Linford Arnold MD, Santina Evans    . Hypoparathyroidism   . BPH (benign prostatic hyperplasia)   . Hyperlipidemia   . CHF (congestive heart failure)     Past Surgical History  Procedure Laterality Date  . Coronary artery bypass graft  03-23-92  . Cataract extraction, bilateral      History   Social History  . Marital Status: Married    Spouse Name: N/A    Number of Children: N/A  . Years of Education: N/A   Occupational History  . Not on file.   Social History Main Topics  . Smoking status: Former Smoker -- 1.00 packs/day for 60 years    Quit date: 10/08/2010  . Smokeless tobacco: Not on file  . Alcohol Use: Yes     Comment: Occasional  . Drug Use: No  . Sexually Active:  Not on file     Comment: works part-time, [employer] completed H, married, no children, regular exercise.S   Other Topics Concern  . Not on file   Social History Narrative  . No narrative on file    ROS: no fevers or chills, productive cough, hemoptysis, dysphasia, odynophagia, melena, hematochezia, dysuria, hematuria, rash, seizure activity, orthopnea, PND, pedal edema, claudication. Remaining systems are negative.  Physical Exam: Well-developed well-nourished in no acute distress.  Skin is warm and dry.  HEENT is normal.  Neck is supple.  Chest is clear to auscultation with normal expansion.  Cardiovascular exam is regular rate and rhythm.  Abdominal exam nontender or distended. No masses palpated. Extremities show no edema. neuro grossly intact

## 2012-07-25 NOTE — Assessment & Plan Note (Signed)
Blood pressure controlled. 

## 2012-07-25 NOTE — Assessment & Plan Note (Signed)
Continue statin. 

## 2012-07-25 NOTE — Patient Instructions (Addendum)
Your physician recommends that you schedule a follow-up appointment in: 8 WEEKS WITH DR [name]  Your physician has requested that you have a cardiac catheterization. Cardiac catheterization is used to diagnose and/or treat various heart conditions. Doctors may recommend this procedure for a number of different reasons. The most common reason is to evaluate chest pain. Chest pain can be a symptom of coronary artery disease (CAD), and cardiac catheterization can show whether plaque is narrowing or blocking your heart's arteries. This procedure is also used to evaluate the valves, as well as measure the blood flow and oxygen levels in different parts of your heart. For further information please visit [email or web address] Please follow instruction sheet, as given.   Your physician recommends that you return for lab work Thursday  STOP METOPROLOL WHEN FINISHED WITH CURRENT BOTTLE  ONCE STOP METOPROLOL START CARVEDILOL 12.5 MG ONE TABLET TWICE DAILY

## 2012-07-25 NOTE — Assessment & Plan Note (Signed)
Patient's symptoms have markedly improved. His Lasix and Cozaar were reduced recently because of orthostatic symptoms. Continue Lasix at 20 mg daily and Cozaar 50 mg daily. His symptoms previously sounded potentially to be more right-sided than left-sided. He has a 200 pack year history of tobacco use and certainly pulmonary hypertension related to COPD could be contributing to his volume excess. Schedule a right and left heart catheterization (risks and benefits discussed and patient agrees to proceed). Hold Lasix a day prior of his procedure and the day of his procedure.

## 2012-07-25 NOTE — Assessment & Plan Note (Signed)
LV function is reduced on echocardiogram with an ejection fraction of 35-40%. Continue ARB. Once he runs out of his metoprolol I will instead treat with Coreg 12.5 mg by mouth twice a day. We will increase this as tolerated by pulse and blood pressure.

## 2012-07-25 NOTE — Assessment & Plan Note (Signed)
Continue aspirin and statin. Left and right catheterization as described under congestive heart failure.

## 2012-07-26 ENCOUNTER — Telehealth: Payer: Self-pay | Admitting: *Deleted

## 2012-07-26 LAB — CBC WITH DIFFERENTIAL/PLATELET
Eosinophils Absolute: 0.4 10*3/uL (ref 0.0–0.7)
Eosinophils Relative: 5 % (ref 0–5)
Hemoglobin: 16.5 g/dL (ref 13.0–17.0)
Lymphs Abs: 2 10*3/uL (ref 0.7–4.0)
MCH: 27.5 pg (ref 26.0–34.0)
MCV: 82.6 fL (ref 78.0–100.0)
Monocytes Absolute: 0.7 10*3/uL (ref 0.1–1.0)
Monocytes Relative: 9 % (ref 3–12)
Platelets: 235 10*3/uL (ref 150–400)
RBC: 5.99 MIL/uL — ABNORMAL HIGH (ref 4.22–5.81)

## 2012-07-26 LAB — BASIC METABOLIC PANEL WITH GFR
CO2: 29 mEq/L (ref 19–32)
Calcium: 10.1 mg/dL (ref 8.4–10.5)
Glucose, Bld: 90 mg/dL (ref 70–99)
Sodium: 140 mEq/L (ref 135–145)

## 2012-07-26 NOTE — Telephone Encounter (Signed)
Patient's wife called today & states that he dr [name] yesterday & he ordered labs.  Pt is having heart cath Monday morning at 8:30 at [facility].  Wife states that she bought an at-home bp monitor & she states his pressure was 113/84 yesterday.  This morning before meds it was around 119/94 (she can't remember exactly).  Would you still like for him to come in tomorrow? Please advise

## 2012-07-26 NOTE — Telephone Encounter (Signed)
LMOM informing wife that I will leave him on the schedule until she calls me first thing in the morning in case his symptoms change and he still needs to come in tomorrow

## 2012-07-26 NOTE — Telephone Encounter (Signed)
No unless symptoms become worse.

## 2012-07-27 ENCOUNTER — Ambulatory Visit: Payer: Medicare PPO | Admitting: Physician Assistant

## 2012-07-27 ENCOUNTER — Other Ambulatory Visit: Payer: Self-pay | Admitting: Cardiology

## 2012-07-27 DIAGNOSIS — I2510 Atherosclerotic heart disease of native coronary artery without angina pectoris: Secondary | ICD-10-CM

## 2012-07-30 ENCOUNTER — Encounter (HOSPITAL_BASED_OUTPATIENT_CLINIC_OR_DEPARTMENT_OTHER): Payer: Self-pay | Admitting: *Deleted

## 2012-07-30 ENCOUNTER — Encounter (HOSPITAL_BASED_OUTPATIENT_CLINIC_OR_DEPARTMENT_OTHER)
Admission: RE | Disposition: A | Discharge status: Home / Self Care | Payer: Self-pay | Source: Ambulatory Visit | Attending: Cardiology

## 2012-07-30 ENCOUNTER — Inpatient Hospital Stay (HOSPITAL_BASED_OUTPATIENT_CLINIC_OR_DEPARTMENT_OTHER)
Admission: RE | Admit: 2012-07-30 | Discharge: 2012-07-30 | Disposition: A | Discharge status: Home / Self Care | Payer: Medicare PPO | Source: Ambulatory Visit | Attending: Cardiology | Admitting: Cardiology

## 2012-07-30 DIAGNOSIS — I10 Essential (primary) hypertension: Secondary | ICD-10-CM | POA: Insufficient documentation

## 2012-07-30 DIAGNOSIS — E785 Hyperlipidemia, unspecified: Secondary | ICD-10-CM | POA: Insufficient documentation

## 2012-07-30 DIAGNOSIS — I509 Heart failure, unspecified: Secondary | ICD-10-CM | POA: Insufficient documentation

## 2012-07-30 DIAGNOSIS — I252 Old myocardial infarction: Secondary | ICD-10-CM | POA: Insufficient documentation

## 2012-07-30 DIAGNOSIS — Z79899 Other long term (current) drug therapy: Secondary | ICD-10-CM | POA: Insufficient documentation

## 2012-07-30 DIAGNOSIS — I2510 Atherosclerotic heart disease of native coronary artery without angina pectoris: Secondary | ICD-10-CM | POA: Insufficient documentation

## 2012-07-30 DIAGNOSIS — Z951 Presence of aortocoronary bypass graft: Secondary | ICD-10-CM | POA: Insufficient documentation

## 2012-07-30 LAB — POCT I-STAT 3, VENOUS BLOOD GAS (G3P V)
O2 Saturation: 67 %
TCO2: 29 mmol/L (ref 0–100)
pCO2, Ven: 57.9 mmHg — ABNORMAL HIGH (ref 45.0–50.0)
pH, Ven: 7.278 (ref 7.250–7.300)
pO2, Ven: 40 mmHg (ref 30.0–45.0)

## 2012-07-30 LAB — POCT I-STAT 3, ART BLOOD GAS (G3+)
Bicarbonate: 27.5 mEq/L — ABNORMAL HIGH (ref 20.0–24.0)
O2 Saturation: 95 %
TCO2: 29 mmol/L (ref 0–100)
pCO2 arterial: 54.8 mmHg — ABNORMAL HIGH (ref 35.0–45.0)
pH, Arterial: 7.308 — ABNORMAL LOW (ref 7.350–7.450)

## 2012-07-30 SURGERY — JV LEFT AND RIGHT HEART CATHETERIZATION WITH CORONARY ANGIOGRAM

## 2012-07-30 MED ORDER — SODIUM CHLORIDE 0.9 % IJ SOLN: 3.0000 mL | INTRAMUSCULAR | Status: DC | PRN

## 2012-07-30 MED ORDER — ASPIRIN 81 MG PO CHEW
324.0000 mg | CHEWABLE_TABLET | ORAL | Status: AC
  Administered 2012-07-30: 324 mg via ORAL

## 2012-07-30 MED ORDER — ACETAMINOPHEN 325 MG PO TABS: 650.0000 mg | ORAL_TABLET | ORAL | Status: DC | PRN

## 2012-07-30 MED ORDER — SODIUM CHLORIDE 0.9 % IJ SOLN: 3.0000 mL | Freq: Two times a day (BID) | INTRAMUSCULAR | Status: DC

## 2012-07-30 MED ORDER — SODIUM CHLORIDE 0.9 % IV SOLN
250.0000 mL | INTRAVENOUS | Status: DC | PRN
  Administered 2012-07-30: 250 mL via INTRAVENOUS

## 2012-07-30 MED ORDER — ONDANSETRON HCL 4 MG/2ML IJ SOLN: 4.0000 mg | Freq: Four times a day (QID) | INTRAMUSCULAR | Status: DC | PRN

## 2012-07-30 MED ORDER — SODIUM CHLORIDE 0.9 % IV SOLN: INTRAVENOUS | Status: DC

## 2012-07-30 MED ORDER — DIAZEPAM 2 MG PO TABS
2.0000 mg | ORAL_TABLET | ORAL | Status: AC
  Administered 2012-07-30: 5 mg via ORAL

## 2012-07-30 MED ORDER — SODIUM CHLORIDE 0.9 % IV SOLN
1.0000 mL/kg/h | INTRAVENOUS | Status: DC
Start: 2012-07-30 — End: 2012-07-30

## 2012-07-30 NOTE — H&P (View-Only) (Signed)
 HPI: Pleasant male for fu of CHF. Patient had a myocardial infarction followed by coronary artery bypassing graft in 1994 in [place]. He had no cardiology followup since that time until I saw him in June of 2014. The patient described 2 years of progressive CHF symptoms. He was seen by primary care and laboratories were performed revealing BNP 328, d-dimer 3.10, normal TSH and hemoglobin. BUN and creatinine 32 and 1.30 and potassium 4.1. Albumin 3.9. Venous Dopplers in May of 2014 showed no DVT. CTA in May of 2014 showed no pulmonary embolus. There was a small right pleural effusion and perihepatic ascites. There was moderate emphysema. Patient was started on Lasix and home O2. Symptoms gradually improving. Echo in June of 2014 showed an ejection fraction of 35-40%. There was mild left ventricular hypertrophy. Right ventricular function was mildly reduced. The patient was seen by primary care earlier this week for dizziness. His Lasix was decreased as was his Cozaar and he feels better. His dyspnea on exertion has improved. He denies orthopnea, PND or chest pain. His pedal edema has markedly improved.   Current Outpatient Prescriptions  Medication Sig Dispense Refill  . aspirin 81 MG EC tablet Take by mouth daily.        . atorvastatin (LIPITOR) 40 MG tablet Take 1 tablet (40 mg total) by mouth daily.  90 tablet  3  . dutasteride (AVODART) 0.5 MG capsule Take 1 capsule (0.5 mg total) by mouth daily.  30 capsule  5  . fenofibrate (TRICOR) 145 MG tablet Take 1 tablet (145 mg total) by mouth daily.  90 tablet  3  . fluticasone (FLONASE) 50 MCG/ACT nasal spray Place 2 sprays into the nose daily.  16 g  1  . furosemide (LASIX) 40 MG tablet Take 20 mg by mouth daily.      . losartan (COZAAR) 100 MG tablet Take 50 mg by mouth daily.      . metoprolol (LOPRESSOR) 100 MG tablet TAKE ONE TABLET BY MOUTH TWICE DAILY  180 tablet  0  . tadalafil (CIALIS) 20 MG tablet Take 1 tablet (20 mg total) by mouth  daily as needed for erectile dysfunction.  3 tablet  0  . [DISCONTINUED] metoprolol (TOPROL-XL) 100 MG 24 hr tablet Take 100 mg by mouth 2 (two) times daily.         No current facility-administered medications for this visit.     Past Medical History  Diagnosis Date  . Heart attack 1994  . Hypertension   . CAD (coronary artery disease)   . OSA on CPAP   . Sciatica     Qualifier: Diagnosis of  By: Metheney MD, Catherine    . Hypoparathyroidism   . BPH (benign prostatic hyperplasia)   . Hyperlipidemia   . CHF (congestive heart failure)     Past Surgical History  Procedure Laterality Date  . Coronary artery bypass graft  03-23-92  . Cataract extraction, bilateral      History   Social History  . Marital Status: Married    Spouse Name: N/A    Number of Children: N/A  . Years of Education: N/A   Occupational History  . Not on file.   Social History Main Topics  . Smoking status: Former Smoker -- 1.00 packs/day for 60 years    Quit date: 10/08/2010  . Smokeless tobacco: Not on file  . Alcohol Use: Yes     Comment: Occasional  . Drug Use: No  . Sexually Active:   Not on file     Comment: works part-time, [employer] completed H, married, no children, regular exercise.S   Other Topics Concern  . Not on file   Social History Narrative  . No narrative on file    ROS: no fevers or chills, productive cough, hemoptysis, dysphasia, odynophagia, melena, hematochezia, dysuria, hematuria, rash, seizure activity, orthopnea, PND, pedal edema, claudication. Remaining systems are negative.  Physical Exam: Well-developed well-nourished in no acute distress.  Skin is warm and dry.  HEENT is normal.  Neck is supple.  Chest is clear to auscultation with normal expansion.  Cardiovascular exam is regular rate and rhythm.  Abdominal exam nontender or distended. No masses palpated. Extremities show no edema. neuro grossly intact      

## 2012-07-30 NOTE — CV Procedure (Addendum)
   Cardiac Catheterization Procedure Note  Name: Joseph Berg MRN: [id number] DOB: 25-Dec-1933  Procedure: Right Heart Cath, Left Heart Cath, Selective Coronary Angiography, LIMA angiography, LV angiography  Indication: 79 year old white male with history of coronary disease status post single-vessel coronary bypass surgery in 1994 with an LIMA graft to the LAD. He presents with recent onset of congestive heart failure. By echocardiogram his ejection fraction is 35-40%.   Procedural Details: The right groin was prepped, draped, and anesthetized with 1% lidocaine. Using the modified Seldinger technique  a 7 French sheath was placed in the right femoral vein. Despite multiple attempts I was able to engage the right femoral artery with the needle with good arterial flow but was unable to advance a wire. We used the left groin for femoral artery access. Initially a 4 French sheath was placed percutaneously. However, due to  significant aortoiliac tortuosity and calcification it was very difficult to manipulate the catheters and we upgraded to a 5 [place] sheath. A Swan-Ganz catheter was used for the right heart catheterization. Standard protocol was followed for recording of right heart pressures and sampling of oxygen saturations. Fick cardiac output was calculated. Standard Judkins catheters were used for selective coronary angiography and left ventriculography. There were no immediate procedural complications. The patient was transferred to the post catheterization recovery area for further monitoring.  Procedural Findings: Hemodynamics RA 8/6 with a mean of 4 mmHg RV 44/9 mmHg PA 39/12 with a mean of 24 mmHg PCWP 20 5/25 with a mean of 20 mmHg LV 160/16 mm per AO 160/68 with a mean of 103 mmHg  Oxygen saturations: PA 67% AO 95%  Cardiac Output (Fick) 4.3 L per minute  Cardiac Index (Fick) 2 L per minute per meter square   Coronary angiography: Coronary dominance: right  Left  mainstem: The left main coronary is calcified. There is a 90% stenosis in the distal left main coronary.  Left anterior descending (LAD): Is a 95% stenosis in the proximal LAD. It is occluded after a small first diagonal branch.  There is a moderate size ramus intermediate branch which is normal.  Left circumflex (LCx): The left circumflex gives rise to a first marginal branch that is occluded. The circumflex then terminates after second marginal branch. It gives rise to collaterals to the right coronary. There are left to left collaterals to the first obtuse marginal vessel.  Right coronary artery (RCA): The right coronary is occluded proximally. There are mild right to right bridging collaterals. There are good left to right collaterals to the distal RCA.  The LIMA graft to the LAD was very difficult to engage. However flush shots demonstrated that the graft was large and patent  Left ventriculography: Left ventricular systolic function is abnormal. Ejection fraction is estimated at 35-40%, the inferior wall is severely hypokinetic. there is no significant mitral regurgitation   Final Conclusions:   1. Severe left main and three-vessel obstructive coronary artery disease. 2. Patent LIMA graft to the LAD 3. Moderate left ventricular dysfunction.  Recommendations: Given his LV dysfunction and significant areas of the myocardium that have not been revascularized would consider redo CABG.   Theron Arista [facility] 07/30/2012, 10:19 AM

## 2012-07-30 NOTE — Interval H&P Note (Signed)
History and Physical Interval Note:  07/30/2012 9:16 AM  Joseph Berg  has presented today for surgery, with the diagnosis of cp  The various methods of treatment have been discussed with the patient and family. After consideration of risks, benefits and other options for treatment, the patient has consented to  Procedure(s): JV LEFT AND RIGHT HEART CATHETERIZATION WITH CORONARY ANGIOGRAM (N/A) as a surgical intervention .  The patient's history has been reviewed, patient examined, no change in status, stable for surgery.  I have reviewed the patient's chart and labs.  Questions were answered to the patient's satisfaction.     Theron Arista [facility] 07/30/2012 9:16 AM

## 2012-07-30 NOTE — Progress Notes (Signed)
Bedrest begins @ 1100, tegaderm dressings applied to left and right groin sites, both level 0.

## 2012-08-01 ENCOUNTER — Other Ambulatory Visit: Payer: Self-pay | Admitting: *Deleted

## 2012-08-01 ENCOUNTER — Observation Stay (HOSPITAL_COMMUNITY)
Admission: EM | Admit: 2012-08-01 | Discharge: 2012-08-03 | Disposition: A | Discharge status: Home / Self Care | Payer: Medicare PPO | Attending: Cardiology | Admitting: Cardiology

## 2012-08-01 ENCOUNTER — Emergency Department (HOSPITAL_COMMUNITY): Payer: Medicare PPO

## 2012-08-01 ENCOUNTER — Telehealth: Payer: Self-pay | Admitting: Cardiology

## 2012-08-01 ENCOUNTER — Encounter: Payer: Medicare PPO | Admitting: Surgery

## 2012-08-01 ENCOUNTER — Encounter: Payer: Self-pay | Admitting: Physician Assistant

## 2012-08-01 DIAGNOSIS — J438 Other emphysema: Secondary | ICD-10-CM | POA: Insufficient documentation

## 2012-08-01 DIAGNOSIS — Y840 Cardiac catheterization as the cause of abnormal reaction of the patient, or of later complication, without mention of misadventure at the time of the procedure: Secondary | ICD-10-CM | POA: Insufficient documentation

## 2012-08-01 DIAGNOSIS — Z79899 Other long term (current) drug therapy: Secondary | ICD-10-CM | POA: Insufficient documentation

## 2012-08-01 DIAGNOSIS — I999 Unspecified disorder of circulatory system: Secondary | ICD-10-CM | POA: Insufficient documentation

## 2012-08-01 DIAGNOSIS — I25810 Atherosclerosis of coronary artery bypass graft(s) without angina pectoris: Secondary | ICD-10-CM

## 2012-08-01 DIAGNOSIS — I5022 Chronic systolic (congestive) heart failure: Secondary | ICD-10-CM

## 2012-08-01 DIAGNOSIS — D62 Acute posthemorrhagic anemia: Secondary | ICD-10-CM | POA: Insufficient documentation

## 2012-08-01 DIAGNOSIS — Z951 Presence of aortocoronary bypass graft: Secondary | ICD-10-CM | POA: Insufficient documentation

## 2012-08-01 DIAGNOSIS — I10 Essential (primary) hypertension: Secondary | ICD-10-CM | POA: Insufficient documentation

## 2012-08-01 DIAGNOSIS — I255 Ischemic cardiomyopathy: Secondary | ICD-10-CM

## 2012-08-01 DIAGNOSIS — R531 Weakness: Secondary | ICD-10-CM

## 2012-08-01 DIAGNOSIS — I724 Aneurysm of artery of lower extremity: Secondary | ICD-10-CM

## 2012-08-01 DIAGNOSIS — I509 Heart failure, unspecified: Secondary | ICD-10-CM

## 2012-08-01 DIAGNOSIS — IMO0002 Reserved for concepts with insufficient information to code with codable children: Principal | ICD-10-CM | POA: Insufficient documentation

## 2012-08-01 DIAGNOSIS — E209 Hypoparathyroidism, unspecified: Secondary | ICD-10-CM | POA: Insufficient documentation

## 2012-08-01 DIAGNOSIS — I2589 Other forms of chronic ischemic heart disease: Secondary | ICD-10-CM

## 2012-08-01 DIAGNOSIS — I2510 Atherosclerotic heart disease of native coronary artery without angina pectoris: Secondary | ICD-10-CM | POA: Insufficient documentation

## 2012-08-01 DIAGNOSIS — G4733 Obstructive sleep apnea (adult) (pediatric): Secondary | ICD-10-CM | POA: Insufficient documentation

## 2012-08-01 DIAGNOSIS — M5430 Sciatica, unspecified side: Secondary | ICD-10-CM | POA: Insufficient documentation

## 2012-08-01 DIAGNOSIS — E785 Hyperlipidemia, unspecified: Secondary | ICD-10-CM | POA: Insufficient documentation

## 2012-08-01 DIAGNOSIS — R55 Syncope and collapse: Secondary | ICD-10-CM

## 2012-08-01 HISTORY — DX: Other ventricular tachycardia: I47.29

## 2012-08-01 HISTORY — DX: Ventricular tachycardia: I47.2

## 2012-08-01 HISTORY — DX: Aneurysm of artery of lower extremity: I72.4

## 2012-08-01 HISTORY — DX: Emphysema, unspecified: J43.9

## 2012-08-01 LAB — BASIC METABOLIC PANEL
BUN: 24 mg/dL — ABNORMAL HIGH (ref 6–23)
Calcium: 9.1 mg/dL (ref 8.4–10.5)
GFR calc non Af Amer: 77 mL/min — ABNORMAL LOW (ref >90)
Glucose, Bld: 112 mg/dL — ABNORMAL HIGH (ref 70–99)
Potassium: 3.8 mEq/L (ref 3.5–5.1)

## 2012-08-01 LAB — CBC
MCH: 27.5 pg (ref 26.0–34.0)
MCHC: 33.1 g/dL (ref 30.0–36.0)
Platelets: 203 10*3/uL (ref 150–400)
RDW: 15.4 % (ref 11.5–15.5)

## 2012-08-01 LAB — CBC WITH DIFFERENTIAL/PLATELET
Eosinophils Absolute: 0.2 10*3/uL (ref 0.0–0.7)
Eosinophils Relative: 1 % (ref 0–5)
HCT: 36.5 % — ABNORMAL LOW (ref 39.0–52.0)
Hemoglobin: 12.2 g/dL — ABNORMAL LOW (ref 13.0–17.0)
Lymphs Abs: 1.4 10*3/uL (ref 0.7–4.0)
MCH: 27.4 pg (ref 26.0–34.0)
MCV: 82 fL (ref 78.0–100.0)
Monocytes Relative: 7 % (ref 3–12)
RBC: 4.45 MIL/uL (ref 4.22–5.81)

## 2012-08-01 LAB — URINALYSIS, ROUTINE W REFLEX MICROSCOPIC
Glucose, UA: NEGATIVE mg/dL
Ketones, ur: NEGATIVE mg/dL
Leukocytes, UA: NEGATIVE
pH: 5.5 (ref 5.0–8.0)

## 2012-08-01 LAB — TROPONIN I: Troponin I: <0.3 ng/mL (ref <0.30)

## 2012-08-01 LAB — GLUCOSE, CAPILLARY: Glucose-Capillary: 94 mg/dL (ref 70–99)

## 2012-08-01 MED ORDER — ACETAMINOPHEN 325 MG PO TABS: 650.0000 mg | ORAL_TABLET | ORAL | Status: DC | PRN

## 2012-08-01 MED ORDER — ONDANSETRON HCL 4 MG/2ML IJ SOLN: 4.0000 mg | Freq: Four times a day (QID) | INTRAMUSCULAR | Status: DC | PRN

## 2012-08-01 MED ORDER — MORPHINE SULFATE 2 MG/ML IJ SOLN
INTRAMUSCULAR | Status: AC
  Administered 2012-08-01: 2 mg via INTRAVENOUS
  Filled 2012-08-01: qty 1

## 2012-08-01 MED ORDER — CARVEDILOL 12.5 MG PO TABS
12.5000 mg | ORAL_TABLET | Freq: Two times a day (BID) | ORAL | Status: DC
  Administered 2012-08-01 – 2012-08-03 (×4): 12.5 mg via ORAL
  Filled 2012-08-01 (×6): qty 1

## 2012-08-01 MED ORDER — SODIUM CHLORIDE 0.9 % IV SOLN
INTRAVENOUS | Status: DC
  Administered 2012-08-01 – 2012-08-02 (×2): via INTRAVENOUS

## 2012-08-01 MED ORDER — SODIUM CHLORIDE 0.9 % IJ SOLN: 3.0000 mL | INTRAMUSCULAR | Status: DC | PRN

## 2012-08-01 MED ORDER — MORPHINE SULFATE 4 MG/ML IJ SOLN
4.0000 mg | INTRAMUSCULAR | Status: DC | PRN
  Administered 2012-08-01: 4 mg via INTRAVENOUS
  Filled 2012-08-01: qty 1

## 2012-08-01 MED ORDER — FENOFIBRATE 160 MG PO TABS
160.0000 mg | ORAL_TABLET | Freq: Every day | ORAL | Status: DC
  Administered 2012-08-02 – 2012-08-03 (×2): 160 mg via ORAL
  Filled 2012-08-01 (×3): qty 1

## 2012-08-01 MED ORDER — ATORVASTATIN CALCIUM 40 MG PO TABS
40.0000 mg | ORAL_TABLET | Freq: Every day | ORAL | Status: DC
  Administered 2012-08-01 – 2012-08-02 (×2): 40 mg via ORAL
  Filled 2012-08-01 (×3): qty 1

## 2012-08-01 MED ORDER — SODIUM CHLORIDE 0.9 % IJ SOLN: 3.0000 mL | Freq: Two times a day (BID) | INTRAMUSCULAR | Status: DC

## 2012-08-01 MED ORDER — MORPHINE SULFATE 2 MG/ML IJ SOLN
2.0000 mg | INTRAMUSCULAR | Status: DC | PRN
  Administered 2012-08-01: 2 mg via INTRAVENOUS

## 2012-08-01 MED ORDER — NITROGLYCERIN 0.4 MG SL SUBL: 0.4000 mg | SUBLINGUAL_TABLET | SUBLINGUAL | Status: DC | PRN

## 2012-08-01 MED ORDER — BIOTENE DRY MOUTH MT LIQD
15.0000 mL | Freq: Two times a day (BID) | OROMUCOSAL | Status: DC
  Administered 2012-08-01 – 2012-08-03 (×4): 15 mL via OROMUCOSAL

## 2012-08-01 MED ORDER — ASPIRIN EC 81 MG PO TBEC: 81.0000 mg | DELAYED_RELEASE_TABLET | Freq: Every day | ORAL | Status: DC

## 2012-08-01 MED ORDER — FLUTICASONE PROPIONATE 50 MCG/ACT NA SUSP
2.0000 | Freq: Every day | NASAL | Status: DC
  Filled 2012-08-01: qty 16

## 2012-08-01 MED ORDER — LORAZEPAM 0.5 MG PO TABS
0.5000 mg | ORAL_TABLET | Freq: Four times a day (QID) | ORAL | Status: DC | PRN
  Administered 2012-08-01: 0.5 mg via ORAL
  Filled 2012-08-01: qty 1

## 2012-08-01 MED ORDER — LOSARTAN POTASSIUM 50 MG PO TABS
50.0000 mg | ORAL_TABLET | Freq: Every day | ORAL | Status: DC
  Administered 2012-08-02 – 2012-08-03 (×2): 50 mg via ORAL
  Filled 2012-08-01 (×3): qty 1

## 2012-08-01 MED ORDER — ASPIRIN 300 MG RE SUPP: 300.0000 mg | RECTAL | Status: DC

## 2012-08-01 MED ORDER — VITAMIN D3 25 MCG (1000 UNIT) PO TABS
1000.0000 [IU] | ORAL_TABLET | Freq: Every day | ORAL | Status: DC
  Administered 2012-08-01 – 2012-08-03 (×3): 1000 [IU] via ORAL
  Filled 2012-08-01 (×3): qty 1

## 2012-08-01 MED ORDER — DUTASTERIDE 0.5 MG PO CAPS
0.5000 mg | ORAL_CAPSULE | Freq: Every day | ORAL | Status: DC
  Administered 2012-08-02 – 2012-08-03 (×2): 0.5 mg via ORAL
  Filled 2012-08-01 (×3): qty 1

## 2012-08-01 MED ORDER — ACETAMINOPHEN 325 MG PO TABS
650.0000 mg | ORAL_TABLET | Freq: Once | ORAL | Status: AC
  Administered 2012-08-01: 650 mg via ORAL
  Filled 2012-08-01: qty 2

## 2012-08-01 MED ORDER — SODIUM CHLORIDE 0.9 % IV SOLN: 250.0000 mL | INTRAVENOUS | Status: DC | PRN

## 2012-08-01 MED ORDER — VITAMIN B-12 100 MCG PO TABS
100.0000 ug | ORAL_TABLET | Freq: Every day | ORAL | Status: DC
  Administered 2012-08-01 – 2012-08-03 (×3): 100 ug via ORAL
  Filled 2012-08-01 (×3): qty 1

## 2012-08-01 MED ORDER — ASPIRIN 81 MG PO CHEW: 324.0000 mg | CHEWABLE_TABLET | ORAL | Status: DC

## 2012-08-01 NOTE — ED Provider Notes (Signed)
History    CSN: [id number] Arrival date & time 08/01/12  4098  First MD Initiated Contact with Patient 08/01/12 [phone]     Chief Complaint  Patient presents with  . Weakness   (Consider location/radiation/quality/duration/timing/severity/associated sxs/prior Treatment) HPI 79 year old male presents to emergency room with report of near-syncope this morning as he was getting ready to get into the shower.  Patient had heart catheterization on Monday.  Per notes, he had difficulties with threading catheter in the right side, left side was then catheterized.  Patient does report difficulties with getting blood stopped once cath was completed on the left side.  Patient reports he's been otherwise well.  No fevers no chills.  No cough no shortness of breath, no chest pain.   He reports no orthopnea or PND.  Heart catheterization recommends repeat CABG in the future.  He was to followup with CT surgery and cardiology today, but came to the emergency room in due to generalized weakness.  Past Medical History  Diagnosis Date  . Heart attack 1994  . Hypertension   . CAD (coronary artery disease)     a. s/p CABG x1 with LIMA-LAD 1994. b. LM & 3v CAD by cath 07/2012.  . OSA on CPAP   . Sciatica     Qualifier: Diagnosis of  By: Linford Arnold MD, Santina Evans    . Hypoparathyroidism   . BPH (benign prostatic hyperplasia)   . Hyperlipidemia   . CHF (congestive heart failure)     a. EF 35-40% by echo 07/2012.  . Emphysema     a. Moderate emphysema by CT 06/2012.   Past Surgical History  Procedure Laterality Date  . Coronary artery bypass graft  03-23-92  . Cataract extraction, bilateral     Family History  Problem Relation Age of Onset  . Heart attack Father 59  . Stroke Brother 46  . Hypertension Brother   . Hyperlipidemia Brother    History  Substance Use Topics  . Smoking status: Former Smoker -- 1.00 packs/day for 60 years    Quit date: 10/08/2010  . Smokeless tobacco: Not on file  .  Alcohol Use: Yes     Comment: Occasional    Review of Systems  All other systems reviewed and are negative.   other than listed in history of present illness  Allergies  Hctz  Home Medications   Current Outpatient Rx  Name  Route  Sig  Dispense  Refill  . acetaminophen (TYLENOL) 500 MG tablet   Oral   Take 1,000 mg by mouth every 6 (six) hours as needed for pain.         [name] aspirin 81 MG EC tablet   Oral   Take by mouth daily.           [name] atorvastatin (LIPITOR) 40 MG tablet   Oral   Take 1 tablet (40 mg total) by mouth daily.   90 tablet   3   . cholecalciferol (VITAMIN D) 1000 UNITS tablet   Oral   Take 1,000 Units by mouth daily.         . Cyanocobalamin (VITAMIN B-12 PO)   Oral   Take 1 tablet by mouth daily.         [name] dutasteride (AVODART) 0.5 MG capsule   Oral   Take 1 capsule (0.5 mg total) by mouth daily.   30 capsule   5   . fenofibrate (TRICOR) 145 MG tablet   Oral   Take 1  tablet (145 mg total) by mouth daily.   90 tablet   3   . fluticasone (FLONASE) 50 MCG/ACT nasal spray   Nasal   Place 2 sprays into the nose daily.   16 g   1   . furosemide (LASIX) 40 MG tablet   Oral   Take 20 mg by mouth daily.         [name] KRILL OIL PO   Oral   Take 2-3 tablets by mouth daily.         [name] losartan (COZAAR) 100 MG tablet   Oral   Take 50 mg by mouth daily.         . metoprolol (LOPRESSOR) 100 MG tablet   Oral   Take 100 mg by mouth 2 (two) times daily.         . Multiple Vitamins-Minerals (MULTIVITAMIN WITH MINERALS) tablet   Oral   Take 1 tablet by mouth daily.         . carvedilol (COREG) 12.5 MG tablet   Oral   Take 1 tablet (12.5 mg total) by mouth 2 (two) times daily.   180 tablet   3    BP 136/57  Pulse 59  Temp(Src) 99 F (37.2 C) (Oral)  Resp 16  SpO2 100% Physical Exam  Nursing note and vitals reviewed. Constitutional: He is oriented to person, place, and time. He appears well-developed and well-nourished.  No distress.  Patient was unable to complete orthostatics due to weakness upon sitting up  HENT:  Head: Normocephalic and atraumatic.  Nose: Nose normal.  Mouth/Throat: Oropharynx is clear and moist.  Eyes: Conjunctivae and EOM are normal. Pupils are equal, round, and reactive to light.  Neck: Normal range of motion. Neck supple. No JVD present. No tracheal deviation present. No thyromegaly present.  Cardiovascular: Normal rate, regular rhythm, normal heart sounds and intact distal pulses.  Exam reveals no gallop and no friction rub.   No murmur heard. Pulmonary/Chest: Effort normal and breath sounds normal. No stridor. No respiratory distress. He has no wheezes. He has no rales. He exhibits no tenderness.  Abdominal: Soft. Bowel sounds are normal. He exhibits no distension and no mass. There is no tenderness. There is no rebound and no guarding.  Musculoskeletal: Normal range of motion. He exhibits tenderness (patient with bruising and tenderness in both groins, left greater than right.  No bruit.  No thrill.  No mass). He exhibits no edema.  Lymphadenopathy:    He has no cervical adenopathy.  Neurological: He is alert and oriented to person, place, and time. He exhibits normal muscle tone. Coordination normal.  Skin: Skin is warm and dry. No rash noted. No erythema. No pallor.  Psychiatric: He has a normal mood and affect. His behavior is normal. Judgment and thought content normal.    ED Course  Procedures (including critical care time) Labs Reviewed  CBC WITH DIFFERENTIAL - Abnormal; Notable for the following:    WBC 10.6 (*)    Hemoglobin 12.2 (*)    HCT 36.5 (*)    Neutrophils Relative % 78 (*)    Neutro Abs 8.3 (*)    All other components within normal limits  BASIC METABOLIC PANEL - Abnormal; Notable for the following:    Glucose, Bld 112 (*)    BUN 24 (*)    GFR calc non Af Amer 77 (*)    GFR calc Af Amer 90 (*)    All other components within normal limits  PRO  B  NATRIURETIC PEPTIDE - Abnormal; Notable for the following:    Pro B Natriuretic peptide (BNP) 491.1 (*)    All other components within normal limits  TROPONIN I  URINALYSIS, ROUTINE W REFLEX MICROSCOPIC   Dg Chest 2 View  08/01/2012   *RADIOLOGY REPORT*  Clinical Data: Shortness of breath.  Chest pain.  CHEST - 2 VIEW  Comparison: Radiographs and CT 07/06/2012.  Findings: The heart size and mediastinal contours are stable status post CABG.  The pleural effusions and patchy basilar opacities noted previously have resolved.  The lungs are now clear.  Old rib fractures are present on the left.  IMPRESSION: Resolved pleural effusions and basilar air space opacities.  No acute cardiopulmonary process.   Original Report Authenticated By: Carey Bullocks, M.D.    Date: 08/01/2012  Rate: 62  Rhythm: normal sinus rhythm  QRS Axis: normal  Intervals: normal  ST/T Wave abnormalities: normal  Conduction Disutrbances:right bundle branch block  Narrative Interpretation:   Old EKG Reviewed: unchanged    1. Weakness   2. Acute blood loss anemia   3. Near syncope     MDM  79 year old male with generalized weakness, 2 days post cath. EKG without any changes.  He has had a drop in his hemoglobin and hematocrit from his precath values.  Will discuss with [place].  He has been too weak to stand in emergency Department.  Feel like he may require admission.  Olivia Mackie, MD 08/01/12 1153

## 2012-08-01 NOTE — ED Notes (Signed)
Orthostatics per [facility]--laying = 118/60 -- 64  Sitting = 98/58 -- 74

## 2012-08-01 NOTE — Progress Notes (Signed)
Called to see patient for expanding hematoma left groin. Patient with documented pseudoaneurysm by ultrasound earlier today. 4 mm neck, 3 cm hematoma. Patient had a number of sneezes then noted increased pain and swelling in the left groin. On exam patient has significant swelling and tenseness in the left infrainguinal area. Very tender to palpation. 20 minutes of direct femoral compression performed manually. The hematoma was softer and less tense. Discomfort improved.  Will transfer to step down unit for closer monitoring. Check stat CBC. Analgesia with morphine. Strict bedrest. I asked Dr. Hart Rochester to assess from vascular surgery standpoint.   Peter Swaziland MD, [facility]

## 2012-08-01 NOTE — ED Notes (Signed)
To ED via [facility] from home with complaint of weakness-- post cath on Monday.

## 2012-08-01 NOTE — Consult Note (Addendum)
Vascular Surgery Consultation  Reason for Consult: Left femoral pseudoaneurysm with hematoma   HPI: Joseph Berg is a 79 y.o. male who presents for evaluation of left femoral pseudoaneurysm. This patient had cardiac catheterization performed by Dr. Peter Swaziland on Monday, June 23 via the left common femoral artery. Patient was discharged and this morning developed pain and lighthead spell while getting in the shower. He was readmitted to the hospital and was found on duplex scan to have a small pseudo-aneurysm-3 cm in the left common femoral artery with a 4 mm neck. The plan was to try to compress this pseudoaneurysm tomorrow in the vascular lab Dr. Swaziland. This evening patient coughed and sneezed and developed a hematoma in the left proximal thigh with pain. Patient was seen by Dr. Swaziland to compress this area for 20-30 minutes it became much softer. No pulsatile mass was noted. Patient had no hypotension. Patient has history of coronary artery bypass grafting with internal mammary graft to the LAD 20 years ago and [facility]. He has been relatively asymptomatic but was found at this time to have diffuse disease and will need redo coronary artery bypass grafting. Patient was to have been seen by Dr. Lavinia Sharps today prior to being admitted to the hospital.   Past Medical History  Diagnosis Date  . Heart attack 1994  . Hypertension   . CAD (coronary artery disease)     a. s/p CABG x1 with LIMA-LAD 1994. b. LM & 3v CAD by cath 07/2012.  . OSA on CPAP   . Sciatica     Qualifier: Diagnosis of  By: Linford Arnold MD, Santina Evans    . Hypoparathyroidism   . BPH (benign prostatic hyperplasia)   . Hyperlipidemia   . CHF (congestive heart failure)     a. EF 35-40% by echo 07/2012.  . Emphysema     a. Moderate emphysema by CT 06/2012.   Past Surgical History  Procedure Laterality Date  . Coronary artery bypass graft  03-23-92  . Cataract extraction, bilateral     History   Social History  .  Marital Status: Married    Spouse Name: N/A    Number of Children: N/A  . Years of Education: N/A   Social History Main Topics  . Smoking status: Former Smoker -- 1.00 packs/day for 60 years    Quit date: 10/08/2010  . Smokeless tobacco: None  . Alcohol Use: Yes     Comment: Occasional  . Drug Use: No  . Sexually Active: None     Comment: works part-time, [employer] completed H, married, no children, regular exercise.S   Other Topics Concern  . None   Social History Narrative  . None   Family History  Problem Relation Age of Onset  . Heart attack Father 55  . Stroke Brother 21  . Hypertension Brother   . Hyperlipidemia Brother    Allergies  Allergen Reactions  . Hctz (Hydrochlorothiazide) Other (See Comments)    Affected renal function.    Prior to Admission medications   Medication Sig Start Date End Date Taking? Authorizing Provider  acetaminophen (TYLENOL) 500 MG tablet Take 1,000 mg by mouth every 6 (six) hours as needed for pain.   Yes Historical Provider, MD  aspirin 81 MG EC tablet Take by mouth daily.     Yes Historical Provider, MD  atorvastatin (LIPITOR) 40 MG tablet Take 1 tablet (40 mg total) by mouth daily. 04/27/12  Yes Agapito Games, MD  cholecalciferol (VITAMIN D)  1000 UNITS tablet Take 1,000 Units by mouth daily.   Yes Historical Provider, MD  Cyanocobalamin (VITAMIN B-12 PO) Take 1 tablet by mouth daily.   Yes Historical Provider, MD  dutasteride (AVODART) 0.5 MG capsule Take 1 capsule (0.5 mg total) by mouth daily. 04/27/12  Yes Agapito Games, MD  fenofibrate (TRICOR) 145 MG tablet Take 1 tablet (145 mg total) by mouth daily. 04/27/12  Yes Agapito Games, MD  fluticasone (FLONASE) 50 MCG/ACT nasal spray Place 2 sprays into the nose daily. 04/27/12  Yes Agapito Games, MD  furosemide (LASIX) 40 MG tablet Take 20 mg by mouth daily. 07/06/12  Yes Geoffery Lyons, MD  KRILL OIL PO Take 2-3 tablets by mouth daily.   Yes Historical  Provider, MD  losartan (COZAAR) 100 MG tablet Take 50 mg by mouth daily. 04/27/12  Yes Agapito Games, MD  metoprolol (LOPRESSOR) 100 MG tablet Take 100 mg by mouth 2 (two) times daily.   Yes Historical Provider, MD  Multiple Vitamins-Minerals (MULTIVITAMIN WITH MINERALS) tablet Take 1 tablet by mouth daily.   Yes Historical Provider, MD  carvedilol (COREG) 12.5 MG tablet Take 1 tablet (12.5 mg total) by mouth 2 (two) times daily. 07/25/12   Lewayne Bunting, MD     Positive ROS: Denies chest pain, dyspnea on exertion, PND, cough orthopnea, lateralizing weakness, aphasia, amaurosis fugax, speech difficulties, claudication.  All other systems have been reviewed and were otherwise negative with the exception of those mentioned in the HPI and as above.  Physical Exam: Filed Vitals:   08/01/12 1858  BP: 133/59  Pulse:   Temp: 99 F (37.2 C)  Resp:     General: Alert, no acute distress HEENT: Normal for age Cardiovascular: Regular rate and rhythm. Carotid pulses 2+, no bruits audible Respiratory: Clear to auscultation. No cyanosis, no use of accessory musculature GI: No organomegaly, abdomen is soft and non-tender--no flank or abdominal tenderness present Skin: No lesions in the area of chief complaint Neurologic: Sensation intact distally Psychiatric: Patient is competent for consent with normal mood and affect Musculoskeletal: No obvious deformities Extremities: Right lower extremity with 2+ femoral pulse no distal pulses palpable. Left lower extremity with 2-3+ femoral pulse 2+ popliteal and 2+ dorsalis pedis pulse palpable. There is moderate ecchymosis in the left proximal thigh but no pulsatile mass in the left inguinal region. Inguinal area is relatively soft for 2-3+ pulse palpable     Assessment/Plan:  Left femoral pseudoaneurysm with left thigh hematoma-stable  Discussed situation with Dr. Swaziland and we feel that the best plan would be to keep the patient at bedrest  today monitored closely and repeat duplex scan in a.m. with attempt at compression of this pseudoaneurysm. Does not appear to have any active bleeding at this point and hematoma is very soft in the thigh. It has diffused into adjacent tissues. We'll monitor blood pressure, hemoglobin, and patient thigh circumference in coronary care unit  [name], MD 08/01/2012 7:50 PM

## 2012-08-01 NOTE — Telephone Encounter (Signed)
Spoke with pt wife, she reports the pt went to get in the shower this morning and got very dizzy. They called EMS and the pt is at [place] now for eval.

## 2012-08-01 NOTE — Progress Notes (Signed)
Patient  Came to the floor around 1600 with hematoma to left groin, bruise noted to R &  Left groin more to left than right groin, v/s stable , about 1800 noted that left groin is getting bigger and more painful, MD  Called, and came to see the patient, MD hold pressure to the left groin, 4mg  morphine iv given per md.MD request patient to be transfer to stepdown for close monitoring. Report given to 2900 RN and patient transfer to 2916 at 1930. Filed Vitals:   08/01/12 1858  BP: 133/59  Pulse:   Temp: 99 F (37.2 C)  Resp:    Joseph Berg O

## 2012-08-01 NOTE — Telephone Encounter (Signed)
New Problem  Pt's wife wants Dr [name] to know that he is on the way to the ED. She had to call EMS for him.  She asked if you could give her a call when you get a chance.

## 2012-08-01 NOTE — H&P (Signed)
Physician History and Physical    Patient ID: CHRYSTIAN CUPPLES MRN: [id number] DOB/AGE: October 13, 1933 79 y.o. Admit date: 08/01/2012  Primary Care Physician: Nani Gasser MD Primary Cardiologist Olga Millers MD  HPI: Mr. Denk presents to the emergency room today with complaints of near syncope. He underwent a diagnostic cardiac catheterization on Monday. Following this he felt well until this morning. He wa getting ready to take a shower when he felt extremely lightheaded and felt like he was going to pass out. He feels very weak and lightheaded. He does have discomfort in his left groin radiating into his left leg. He does have some bruising in this area. He denies any significant chest pain or shortness of breath. Patient was recently evaluated by Dr. [name] for evaluation of congestive heart failure. He is status post remote coronary bypass grafting in 1994 in [place]. This was a single vessel bypass with a LIMA to the LAD. He had venous Dopplers last month which showed no evidence of DVT. CT angiogram of the chest showed no evidence of pulmonary embolus. Echocardiogram earlier this month showed an ejection fraction of 35-40%. He was treated with diuresis, beta blocker, and ARB with resolution of his CHF symptoms. This past Monday he underwent diagnostic cardiac catheterization. This demonstrated normal right heart pressures. Pulmonary capillary wedge pressure was 20 mmHg. The LIMA graft to the LAD was patent. The patient also had severe distal left main disease to 90%. The first marginal branch was occluded. The right coronary was occluded proximally with left to right collaterals. Redo CABG was recommended given his LV dysfunction and significant ischemic territories. He was scheduled to see Dr. Laneta Simmers today. Instead, he is seen in the emergency room.  Review of systems complete and found to be negative unless listed above  Past Medical History  Diagnosis Date  . Heart attack  1994  . Hypertension   . CAD (coronary artery disease)     a. s/p CABG x1 with LIMA-LAD 1994. b. LM & 3v CAD by cath 07/2012.  . OSA on CPAP   . Sciatica     Qualifier: Diagnosis of  By: Linford Arnold MD, Santina Evans    . Hypoparathyroidism   . BPH (benign prostatic hyperplasia)   . Hyperlipidemia   . CHF (congestive heart failure)     a. EF 35-40% by echo 07/2012.  . Emphysema     a. Moderate emphysema by CT 06/2012.    Family History  Problem Relation Age of Onset  . Heart attack Father 48  . Stroke Brother 56  . Hypertension Brother   . Hyperlipidemia Brother     History   Social History  . Marital Status: Married    Spouse Name: N/A    Number of Children: N/A  . Years of Education: N/A   Occupational History  . Not on file.   Social History Main Topics  . Smoking status: Former Smoker -- 1.00 packs/day for 60 years    Quit date: 10/08/2010  . Smokeless tobacco: Not on file  . Alcohol Use: Yes     Comment: Occasional  . Drug Use: No  . Sexually Active: Not on file     Comment: works part-time, [employer] completed H, married, no children, regular exercise.S   Other Topics Concern  . Not on file   Social History Narrative  . No narrative on file    Past Surgical History  Procedure Laterality Date  . Coronary artery bypass graft  03-23-92  .  Cataract extraction, bilateral        Physical Exam: Blood pressure 136/57, pulse 59, temperature 99 F (37.2 C), temperature source Oral, resp. rate 16, SpO2 100.00%.  He is a pleasant, obese white male in no acute distress. HEENT: balding, normocephalic, atraumatic. Pupils equal round and reactive light accommodation. Sclera clear. Oropharynx is clear. Neck is supple no JVD, adenopathy, thyromegaly, or bruits. Lungs: Clear Cardiovascular: Regular rate and rhythm, normal S1 and S2, no gallop, murmur, or click. Abdomen: Obese, soft, nontender. No masses or bruits. No hepatosplenomegaly. Extremities: There is mild  bruising in the right groin without hematoma. There is moderate bruising in the left groin extending down to the anterior thigh. This is tender to palpation. It is soft without significant hematoma. There are no bruits. Pedal pulses are good. There is no cyanosis or edema. Skin: Warm and dry Neuro: Alert and oriented x3. Cranial nerves II through XII are intact. No focal findings.   Labs:   Lab Results  Component Value Date   WBC 10.6* 08/01/2012   HGB 12.2* 08/01/2012   HCT 36.5* 08/01/2012   MCV 82.0 08/01/2012   PLT 192 08/01/2012    Recent Labs Lab 08/01/12 0958  NA 139  K 3.8  CL 103  CO2 25  BUN 24*  CREATININE 0.98  CALCIUM 9.1  GLUCOSE 112*   Lab Results  Component Value Date   CKTOTAL 31 07/06/2012   CKMB 1.9 07/06/2012   TROPONINI <0.30 08/01/2012    Lab Results  Component Value Date   CHOL 130 06/22/2011   CHOL 159 12/23/2010   CHOL 152 03/22/2010   Lab Results  Component Value Date   HDL 31* 06/22/2011   HDL 41 12/23/2010   HDL 38* 03/22/2010   Lab Results  Component Value Date   LDLCALC 68 06/22/2011   LDLCALC 83 12/23/2010   LDLCALC 71 03/22/2010   Lab Results  Component Value Date   TRIG 155* 06/22/2011   TRIG 177* 12/23/2010   TRIG 165* 07/22/2010   Lab Results  Component Value Date   CHOLHDL 4.2 06/22/2011   CHOLHDL 3.9 12/23/2010   CHOLHDL 4.0 Ratio 03/22/2010   No results found for this basename: LDLDIRECT      Radiology:CHEST - 2 VIEW  Comparison: Radiographs and CT 07/06/2012.  Findings: The heart size and mediastinal contours are stable status post CABG. The pleural effusions and patchy basilar opacities noted previously have resolved. The lungs are now clear. Old rib fractures are present on the left.  IMPRESSION: Resolved pleural effusions and basilar air space opacities. No acute cardiopulmonary process.   Original Report Authenticated By: Carey Bullocks, M.D.   EKG: Normal sinus rhythm, rate of 62 beats per minute. Right  bundle branch block. No acute changes.  ASSESSMENT AND PLAN:  1. Near syncope. Suspect this may be related to increased vagal tone with some bruising in his left groin. We'll check orthostatic pressures. Will ultrasound his left groin to rule out pseudoaneurysm although I think this is unlikely. We'll monitor overnight on telemetry. Cycle cardiac enzymes. Will hold diuretics for now. 2. Coronary disease status post CABG x1 with LIMA graft to the LAD. Now with severe left main, circumflex, and RCA disease. I have made Dr. Sharee Pimple office aware of patient's admission 3. Congestive heart failure with chronic systolic dysfunction. Ischemic cardiomyopathy with ejection fraction of 35-40%. 4. Hypertension 5. Obstructive sleep apnea on home CPAP therapy. 6. Hyperlipidemia.  SignedTheron Arista [facility] 08/01/2012, 1:17 PM

## 2012-08-01 NOTE — Progress Notes (Addendum)
*  PRELIMINARY RESULTS* Vascular Ultrasound Left groin pseudoaneurysm check has been completed.  Preliminary findings: Left groin= evidence of a pseudoaneurysm. Neck measuring 4.30mm width, and 5 mm length. Pseudo diameter 3.8cm.   Called report to Dr. Swaziland. We will recheck in AM and possibly do compression.   Gertie Fey, RVT, RDMS, RDCS Farrel Demark, RDMS, RVT  08/01/2012, 3:28 PM

## 2012-08-02 ENCOUNTER — Encounter (HOSPITAL_COMMUNITY): Payer: Medicare PPO

## 2012-08-02 DIAGNOSIS — D62 Acute posthemorrhagic anemia: Secondary | ICD-10-CM

## 2012-08-02 DIAGNOSIS — I2510 Atherosclerotic heart disease of native coronary artery without angina pectoris: Secondary | ICD-10-CM

## 2012-08-02 DIAGNOSIS — Z01810 Encounter for preprocedural cardiovascular examination: Secondary | ICD-10-CM

## 2012-08-02 LAB — BASIC METABOLIC PANEL
BUN: 23 mg/dL (ref 6–23)
Calcium: 8.7 mg/dL (ref 8.4–10.5)
Creatinine, Ser: 0.86 mg/dL (ref 0.50–1.35)
GFR calc Af Amer: >90 mL/min (ref >90)
GFR calc non Af Amer: 82 mL/min — ABNORMAL LOW (ref >90)

## 2012-08-02 LAB — CBC
HCT: 32.6 % — ABNORMAL LOW (ref 39.0–52.0)
Hemoglobin: 9.5 g/dL — ABNORMAL LOW (ref 13.0–17.0)
MCH: 27.1 pg (ref 26.0–34.0)
MCHC: 32.2 g/dL (ref 30.0–36.0)
MCHC: 32.6 g/dL (ref 30.0–36.0)
MCV: 83.2 fL (ref 78.0–100.0)
Platelets: 159 10*3/uL (ref 150–400)
Platelets: 156 10*3/uL (ref 150–400)
RDW: 15.4 % (ref 11.5–15.5)
RDW: 15.4 % (ref 11.5–15.5)

## 2012-08-02 LAB — MRSA PCR SCREENING: MRSA by PCR: NEGATIVE

## 2012-08-02 LAB — TROPONIN I: Troponin I: <0.3 ng/mL (ref <0.30)

## 2012-08-02 MED ORDER — HYDRALAZINE HCL 20 MG/ML IJ SOLN: 10.0000 mg | INTRAMUSCULAR | Status: DC | PRN

## 2012-08-02 MED ORDER — MORPHINE SULFATE 4 MG/ML IJ SOLN
INTRAMUSCULAR | Status: AC
  Filled 2012-08-02: qty 2

## 2012-08-02 MED ORDER — MORPHINE SULFATE 2 MG/ML IJ SOLN
2.0000 mg | INTRAMUSCULAR | Status: DC | PRN
  Administered 2012-08-02 (×3): 2 mg via INTRAVENOUS

## 2012-08-02 MED ORDER — HYDRALAZINE HCL 25 MG PO TABS
25.0000 mg | ORAL_TABLET | Freq: Three times a day (TID) | ORAL | Status: AC
  Administered 2012-08-02: 25 mg via ORAL
  Filled 2012-08-02 (×2): qty 1

## 2012-08-02 MED ORDER — TRAZODONE 25 MG HALF TABLET
25.0000 mg | ORAL_TABLET | Freq: Every evening | ORAL | Status: DC | PRN
  Administered 2012-08-02: 25 mg via ORAL
  Filled 2012-08-02: qty 1

## 2012-08-02 MED ORDER — FUROSEMIDE 20 MG PO TABS
20.0000 mg | ORAL_TABLET | Freq: Every day | ORAL | Status: DC
  Administered 2012-08-02 – 2012-08-03 (×2): 20 mg via ORAL
  Filled 2012-08-02 (×2): qty 1

## 2012-08-02 MED ORDER — HYDROCODONE-ACETAMINOPHEN 5-325 MG PO TABS
1.0000 | ORAL_TABLET | Freq: Four times a day (QID) | ORAL | Status: DC | PRN
  Administered 2012-08-02 – 2012-08-03 (×2): 2 via ORAL
  Filled 2012-08-02 (×2): qty 2

## 2012-08-02 NOTE — Progress Notes (Signed)
Orthostatic vitals were not checked, pt is on bedrest.

## 2012-08-02 NOTE — Progress Notes (Signed)
Patient ID: Joseph Berg, male   DOB: Apr 10, 1933, 79 y.o.   MRN: [id number] Vascular Surgery Progress Note  Subjective: Patient developed left common femoral pseudoaneurysm post cardiac cath 48 hours earlier. Had bleed into left thigh. Patient states feels better this morning and is not increasing in discomfort. Has been stable hemodynamically throughout the night. Slight increase in thigh circumference according to nursing staff.  Objective:  Filed Vitals:   08/02/12 0740  BP: 139/53  Pulse: 68  Temp: 98.3 F (36.8 C)  Resp: 20    General alert and oriented x3 in no apparent stress Lungs no rhonchi or wheezing Cardiovascular regular and no murmurs Left inguinal area unchanged from last evening with tenderness to palpation. No large pulsatile mass noted. Hematoma in thigh has diffused into tissues. 3+ dorsalis pedis pulse palpable left foot.   Labs:  Recent Labs Lab 07/26/12 1028 08/01/12 0958 08/02/12 0145  CREATININE 1.04 0.98 0.86    Recent Labs Lab 07/26/12 1028 08/01/12 0958 08/02/12 0145  NA 140 139 138  K 4.4 3.8 3.8  CL 103 103 104  CO2 29 25 25   BUN 21 24* 23  CREATININE 1.04 0.98 0.86  GLUCOSE 90 112* 105*  CALCIUM 10.1 9.1 8.7    Recent Labs Lab 08/01/12 0958 08/01/12 2010 08/02/12 0151  WBC 10.6* 9.0 9.8  HGB 12.2* 11.4* 10.5*  HCT 36.5* 34.4* 32.6*  PLT 192 203 159    Recent Labs Lab 07/26/12 1028  INR 1.03    I/O last 3 completed shifts: In: 1065 [P.O.:240; I.V.:825] Out: 600 [Urine:600]  Imaging: Dg Chest 2 View  08/01/2012   *RADIOLOGY REPORT*  Clinical Data: Shortness of breath.  Chest pain.  CHEST - 2 VIEW  Comparison: Radiographs and CT 07/06/2012.  Findings: The heart size and mediastinal contours are stable status post CABG.  The pleural effusions and patchy basilar opacities noted previously have resolved.  The lungs are now clear.  Old rib fractures are present on the left.  IMPRESSION: Resolved pleural effusions and  basilar air space opacities.  No acute cardiopulmonary process.   Original Report Authenticated By: Carey Bullocks, M.D.    Assessment/Plan:   LOS: 1 day  s/p   Hematoma in the left thigh is diffused into tissues and stable. No evidence of further bleeding. Hemoglobin stable at 10.5 g. Patient for repeat duplex scan today with attempt at compression of pseudoaneurysm. No indication for surgical repair at present time   [name], MD 08/02/2012 9:03 AM

## 2012-08-02 NOTE — Progress Notes (Signed)
VASCULAR LAB PRELIMINARY  PRELIMINARY  PRELIMINARY  PRELIMINARY  Left ultrasound guided pseudoaneurysm compression of the groin completed.    Preliminary report:  Pseudoaneurysm thrombosed post 15 minutes of compression. To be rechecked in AM.  Kara Melching, RVS 08/02/2012, 12:16 PM

## 2012-08-02 NOTE — Care Management Note (Addendum)
    Page 1 of 2   08/03/2012     1:27:24 PM   CARE MANAGEMENT NOTE 08/03/2012  Patient:  Joseph Berg, Joseph Berg   Account Number:  [id number]  Date Initiated:  08/02/2012  Documentation initiated by:  Junius Creamer  Subjective/Objective Assessment:   adm w presyncopay and pseudoanurysm     Action/Plan:   lives w wife, pcp dr Erenest Blank   Anticipated DC Date:  08/03/2012   Anticipated DC Plan:  HOME W HOME HEALTH SERVICES      DC Planning Services  CM consult      [facility] Choice  HOME HEALTH  DURABLE MEDICAL EQUIPMENT   Choice offered to / List presented to:  C-1 Patient   DME arranged  [facility]  BEDSIDE COMMODE  SHOWER STOOL      DME agency  [facility]     HH arranged  HH-2 PT      HH agency  [facility]   Status of service:   Medicare Important Message given?   (If response is "NO", the following Medicare IM given date fields will be blank) Date Medicare IM given:   Date Additional Medicare IM given:    Discharge Disposition:  HOME W HOME HEALTH SERVICES  Per UR Regulation:  Reviewed for med. necessity/level of care/duration of stay  If discussed at Long Length of Stay Meetings, dates discussed:    Comments:

## 2012-08-02 NOTE — Progress Notes (Signed)
Pseudoaneurysm compression performed. Total 6 mg MSO4 given. 15 minutes compression required.  Pt to be on bedrest x 4 hr.  Recheck in am.

## 2012-08-02 NOTE — Progress Notes (Signed)
Measurements to left thigh increasing slowly around 1/2 centimeter every 90 minutes. Pressure held to left groin at 2315 for thirty minutes. Patient stated his pain to be 10/10 while holding pressure. Morphine 4 mg given prior to hold. Patient became nauseated during hold. BP decreased acutely then came back up. MD notified and came to bedside to assess. Dr. Chase Picket said to continue with our current plan of checking thigh measurements frequently. Left groin remains soft.

## 2012-08-02 NOTE — Progress Notes (Addendum)
   Subjective:  Denies CP or dyspnea; complains of left thigh pain   Objective:  Filed Vitals:   08/02/12 0500 08/02/12 0535 08/02/12 0600 08/02/12 0740  BP: 125/60  126/57 139/53  Pulse: 66 67 70 68  Temp:  98.2 F (36.8 C)  98.3 F (36.8 C)  TempSrc:  Oral  Oral  Resp: 16 19 23 20   Height:      Weight:      SpO2: 93% 89% 90% 99%    Intake/Output from previous day:  Intake/Output Summary (Last 24 hours) at 08/02/12 0746 Last data filed at 08/02/12 0600  Gross per 24 hour  Intake   1065 ml  Output    600 ml  Net    465 ml    Physical Exam: Physical exam: Well-developed well-nourished in no acute distress.  Skin is warm and dry.  HEENT is normal.  Neck is supple.  Chest is clear to auscultation with normal expansion.  Cardiovascular exam is regular rate and rhythm.  Abdominal exam nontender or distended. No masses palpated. Left groin is ecchymotic, there is a left thigh hematoma. There is a 2+ dorsalis pedis pulse on the left. Extremities show no edema. neuro grossly intact    Lab Results: Basic Metabolic Panel:  Recent Labs  40/98/11 0958 08/02/12 0145  NA 139 138  K 3.8 3.8  CL 103 104  CO2 25 25  GLUCOSE 112* 105*  BUN 24* 23  CREATININE 0.98 0.86  CALCIUM 9.1 8.7   CBC:  Recent Labs  08/01/12 0958 08/01/12 2010 08/02/12 0151  WBC 10.6* 9.0 9.8  NEUTROABS 8.3*  --   --   HGB 12.2* 11.4* 10.5*  HCT 36.5* 34.4* 32.6*  MCV 82.0 83.1 83.2  PLT 192 203 159   Cardiac Enzymes:  Recent Labs  08/01/12 1408 08/01/12 2010 08/02/12 0145  TROPONINI <0.30 <0.30 <0.30     Assessment/Plan:  1 left thigh hematoma/pseudoaneurysm-the bleeding appears to have stopped. Recheck hemoglobin this evening and tomorrow morning. Dr. Excell Seltzer to evaluate for thrombin injection. 2 coronary artery disease status post coronary artery bypass and graft-once he recovers from his hematoma we will ask CVTS to evaluate as an outpatient for redo coronary artery  bypassing graft. 3 chronic combined systolic/diastolic congestive heart failure-continue present medications. Euvolemic on examination.  Olga Millers 08/02/2012, 7:46 AM

## 2012-08-02 NOTE — Consult Note (Signed)
[address]       [place] [address]             [phone]         Reason for Consult: Severe left main and multi-vessel coronary disease Referring Physician: Dr. Peter Swaziland   Joseph Berg is an 79 y.o. male.  HPI:   The patient is a 79 year old gentleman with a history of hypertension and coronary disease status post coronary bypass graft surgery x1 using a left internal mammary graft to the LAD in 1994 in [place], [place]. Apparently had a myocardial infarction prior to that surgery. He recently presented with a 2 year history of progressive congestive heart failure symptoms with worsening lower extremity edema. He denies any shortness of breath although his wife said that he has been very sedentary and has been short of breath. He was seen by his primary physician and workup was begun. He had lower extremity venous Dopplers which were negative for DVT. He had a CT angiogram the chest showed no evidence of pulmonary embolus with a small right pleural effusion. He was started on Lasix and home oxygen and his symptoms gradually improved. His wife said he lost about 38 pounds of fluid. An echocardiogram earlier this month showed an ejection fraction of 35-40% with mild left ventricular hypertrophy and mild right ventricular dysfunction. He was referred to Dr. [name] and underwent cardiac catheterization on 07/30/2012. This showed 90% stenosis in the distal left main coronary artery. The LAD had 95% proximal stenosis and it was occluded after the small first diagonal branch. The distal vessel was supplied by a patent left internal mammary graft. There is a moderate-sized intermediate branch that had no disease. Left circumflex had a small-to-moderate size first marginal branch was occluded and filled by collaterals. It terminated after a second marginal branch which was always also a small to moderate-sized vessel. It gives collaterals to the right coronary which was  occluded proximally. Left ventricular ejection fraction was 35-40% with severe inferior hypokinesis and no significant mitral regurgitation. Right heart pressures were within normal limits. Cardiac index was 2 L per minute and his pulmonary artery oxygen saturation was 67%. He was scheduled to see me in the office on Wednesday but was admitted Wednesday morning after an episode of severe dizziness early in the morning after awakening. He was diagnosed with a left groin pseudoaneurysm which subsequently resulted in a large left thigh hematoma. The pseudoaneurysm was treated earlier today with compression and thrombosed after 15 minutes of compression.   Past Medical History  Diagnosis Date  . Heart attack 1994  . Hypertension   . CAD (coronary artery disease)     a. s/p CABG x1 with LIMA-LAD 1994. b. LM & 3v CAD by cath 07/2012.  . OSA on CPAP   . Sciatica     Qualifier: Diagnosis of  By: Linford Arnold MD, Santina Evans    . Hypoparathyroidism   . BPH (benign prostatic hyperplasia)   . Hyperlipidemia   . CHF (congestive heart failure)     a. EF 35-40% by echo 07/2012.  . Emphysema     a. Moderate emphysema by CT 06/2012.    Past Surgical History  Procedure Laterality Date  . Coronary artery bypass graft  03-23-92  . Cataract extraction, bilateral      Family History  Problem Relation Age of Onset  . Heart attack Father 71  . Stroke Brother 43  . Hypertension Brother   .  Hyperlipidemia Brother     Social History:  reports that he quit smoking about 21 months ago. He does not have any smokeless tobacco history on file. He reports that  drinks alcohol. He reports that he does not use illicit drugs.  Allergies:  Allergies  Allergen Reactions  . Hctz (Hydrochlorothiazide) Other (See Comments)    Affected renal function.     Medications:  I have reviewed the patient's current medications. Prior to Admission:  Prescriptions prior to admission  Medication Sig Dispense Refill  .  acetaminophen (TYLENOL) 500 MG tablet Take 1,000 mg by mouth every 6 (six) hours as needed for pain.      [name] aspirin 81 MG EC tablet Take by mouth daily.        [name] atorvastatin (LIPITOR) 40 MG tablet Take 1 tablet (40 mg total) by mouth daily.  90 tablet  3  . cholecalciferol (VITAMIN D) 1000 UNITS tablet Take 1,000 Units by mouth daily.      . Cyanocobalamin (VITAMIN B-12 PO) Take 1 tablet by mouth daily.      [name] dutasteride (AVODART) 0.5 MG capsule Take 1 capsule (0.5 mg total) by mouth daily.  30 capsule  5  . fenofibrate (TRICOR) 145 MG tablet Take 1 tablet (145 mg total) by mouth daily.  90 tablet  3  . fluticasone (FLONASE) 50 MCG/ACT nasal spray Place 2 sprays into the nose daily.  16 g  1  . furosemide (LASIX) 40 MG tablet Take 20 mg by mouth daily.      [name] KRILL OIL PO Take 2-3 tablets by mouth daily.      [name] losartan (COZAAR) 100 MG tablet Take 50 mg by mouth daily.      . metoprolol (LOPRESSOR) 100 MG tablet Take 100 mg by mouth 2 (two) times daily.      . Multiple Vitamins-Minerals (MULTIVITAMIN WITH MINERALS) tablet Take 1 tablet by mouth daily.      . carvedilol (COREG) 12.5 MG tablet Take 1 tablet (12.5 mg total) by mouth 2 (two) times daily.  180 tablet  3   Scheduled: . antiseptic oral rinse  15 mL Mouth Rinse BID  . atorvastatin  40 mg Oral q1800  . carvedilol  12.5 mg Oral BID WC  . cholecalciferol  1,000 Units Oral Daily  . dutasteride  0.5 mg Oral Daily  . fenofibrate  160 mg Oral Daily  . fluticasone  2 spray Each Nare Daily  . furosemide  20 mg Oral Daily  . hydrALAZINE  25 mg Oral Q8H  . losartan  50 mg Oral Daily  . sodium chloride  3 mL Intravenous Q12H  . vitamin B-12  100 mcg Oral Daily   Continuous: . sodium chloride 75 mL/hr at 08/01/12 1900   WUJ:WJXBJY chloride, acetaminophen, hydrALAZINE, HYDROcodone-acetaminophen, LORazepam, morphine injection, morphine injection, nitroGLYCERIN, ondansetron (ZOFRAN) IV, sodium chloride Anti-infectives   None       Results for orders placed during the hospital encounter of 08/01/12 (from the past 48 hour(s))  CBC WITH DIFFERENTIAL     Status: Abnormal   Collection Time    08/01/12  9:58 AM      Result Value Range   WBC 10.6 (*) 4.0 - 10.5 K/uL   RBC 4.45  4.22 - 5.81 MIL/uL   Hemoglobin 12.2 (*) 13.0 - 17.0 g/dL   HCT 78.2 (*) 95.6 - 21.3 %   MCV 82.0  78.0 - 100.0 fL   MCH 27.4  26.0 - 34.0  pg   MCHC 33.4  30.0 - 36.0 g/dL   RDW 41.3  24.4 - 01.0 %   Platelets 192  150 - 400 K/uL   Neutrophils Relative % 78 (*) 43 - 77 %   Neutro Abs 8.3 (*) 1.7 - 7.7 K/uL   Lymphocytes Relative 13  12 - 46 %   Lymphs Abs 1.4  0.7 - 4.0 K/uL   Monocytes Relative 7  3 - 12 %   Monocytes Absolute 0.8  0.1 - 1.0 K/uL   Eosinophils Relative 1  0 - 5 %   Eosinophils Absolute 0.2  0.0 - 0.7 K/uL   Basophils Relative 0  0 - 1 %   Basophils Absolute 0.0  0.0 - 0.1 K/uL  BASIC METABOLIC PANEL     Status: Abnormal   Collection Time    08/01/12  9:58 AM      Result Value Range   Sodium 139  135 - 145 mEq/L   Potassium 3.8  3.5 - 5.1 mEq/L   Chloride 103  96 - 112 mEq/L   CO2 25  19 - 32 mEq/L   Glucose, Bld 112 (*) 70 - 99 mg/dL   BUN 24 (*) 6 - 23 mg/dL   Creatinine, Ser 2.72  0.50 - 1.35 mg/dL   Calcium 9.1  8.4 - 53.6 mg/dL   GFR calc non Af Amer 77 (*) >90 mL/min   GFR calc Af Amer 90 (*) >90 mL/min   Comment:            The eGFR has been calculated     using the CKD EPI equation.     This calculation has not been     validated in all clinical     situations.     eGFR's persistently     <90 mL/min signify     possible Chronic Kidney Disease.  PRO B NATRIURETIC PEPTIDE     Status: Abnormal   Collection Time    08/01/12  9:58 AM      Result Value Range   Pro B Natriuretic peptide (BNP) 491.1 (*) 0 - 450 pg/mL  TROPONIN I     Status: None   Collection Time    08/01/12  9:58 AM      Result Value Range   Troponin I <0.30  <0.30 ng/mL   Comment:            Due to the release kinetics of cTnI,      a negative result within the first hours     of the onset of symptoms does not rule out     myocardial infarction with certainty.     If myocardial infarction is still suspected,     repeat the test at appropriate intervals.  URINALYSIS, ROUTINE W REFLEX MICROSCOPIC     Status: None   Collection Time    08/01/12 11:27 AM      Result Value Range   Color, Urine YELLOW  YELLOW   APPearance CLEAR  CLEAR   Specific Gravity, Urine 1.016  1.005 - 1.030   pH 5.5  5.0 - 8.0   Glucose, UA NEGATIVE  NEGATIVE mg/dL   Hgb urine dipstick NEGATIVE  NEGATIVE   Bilirubin Urine NEGATIVE  NEGATIVE   Ketones, ur NEGATIVE  NEGATIVE mg/dL   Protein, ur NEGATIVE  NEGATIVE mg/dL   Urobilinogen, UA 1.0  0.0 - 1.0 mg/dL   Nitrite NEGATIVE  NEGATIVE   Leukocytes, UA NEGATIVE  NEGATIVE  Comment: MICROSCOPIC NOT DONE ON URINES WITH NEGATIVE PROTEIN, BLOOD, LEUKOCYTES, NITRITE, OR GLUCOSE <1000 mg/dL.  TROPONIN I     Status: None   Collection Time    08/01/12  2:08 PM      Result Value Range   Troponin I <0.30  <0.30 ng/mL   Comment:            Due to the release kinetics of cTnI,     a negative result within the first hours     of the onset of symptoms does not rule out     myocardial infarction with certainty.     If myocardial infarction is still suspected,     repeat the test at appropriate intervals.  TROPONIN I     Status: None   Collection Time    08/01/12  8:10 PM      Result Value Range   Troponin I <0.30  <0.30 ng/mL   Comment:            Due to the release kinetics of cTnI,     a negative result within the first hours     of the onset of symptoms does not rule out     myocardial infarction with certainty.     If myocardial infarction is still suspected,     repeat the test at appropriate intervals.  CBC     Status: Abnormal   Collection Time    08/01/12  8:10 PM      Result Value Range   WBC 9.0  4.0 - 10.5 K/uL   Comment: WHITE COUNT CONFIRMED ON SMEAR   RBC 4.14 (*) 4.22 -  5.81 MIL/uL   Hemoglobin 11.4 (*) 13.0 - 17.0 g/dL   HCT 16.1 (*) 09.6 - 04.5 %   MCV 83.1  78.0 - 100.0 fL   MCH 27.5  26.0 - 34.0 pg   MCHC 33.1  30.0 - 36.0 g/dL   RDW 40.9  81.1 - 91.4 %   Platelets 203  150 - 400 K/uL  GLUCOSE, CAPILLARY     Status: None   Collection Time    08/01/12 10:05 PM      Result Value Range   Glucose-Capillary 94  70 - 99 mg/dL  MRSA PCR SCREENING     Status: None   Collection Time    08/01/12 11:53 PM      Result Value Range   MRSA by PCR NEGATIVE  NEGATIVE   Comment:            The GeneXpert MRSA Assay (FDA     approved for NASAL specimens     only), is one component of a     comprehensive MRSA colonization     surveillance program. It is not     intended to diagnose MRSA     infection nor to guide or     monitor treatment for     MRSA infections.  TROPONIN I     Status: None   Collection Time    08/02/12  1:45 AM      Result Value Range   Troponin I <0.30  <0.30 ng/mL   Comment:            Due to the release kinetics of cTnI,     a negative result within the first hours     of the onset of symptoms does not rule out     myocardial infarction with certainty.     If  myocardial infarction is still suspected,     repeat the test at appropriate intervals.  BASIC METABOLIC PANEL     Status: Abnormal   Collection Time    08/02/12  1:45 AM      Result Value Range   Sodium 138  135 - 145 mEq/L   Potassium 3.8  3.5 - 5.1 mEq/L   Chloride 104  96 - 112 mEq/L   CO2 25  19 - 32 mEq/L   Glucose, Bld 105 (*) 70 - 99 mg/dL   BUN 23  6 - 23 mg/dL   Creatinine, Ser 1.61  0.50 - 1.35 mg/dL   Calcium 8.7  8.4 - 09.6 mg/dL   GFR calc non Af Amer 82 (*) >90 mL/min   GFR calc Af Amer >90  >90 mL/min   Comment:            The eGFR has been calculated     using the CKD EPI equation.     This calculation has not been     validated in all clinical     situations.     eGFR's persistently     <90 mL/min signify     possible Chronic Kidney Disease.   CBC     Status: Abnormal   Collection Time    08/02/12  1:51 AM      Result Value Range   WBC 9.8  4.0 - 10.5 K/uL   RBC 3.92 (*) 4.22 - 5.81 MIL/uL   Hemoglobin 10.5 (*) 13.0 - 17.0 g/dL   HCT 04.5 (*) 40.9 - 81.1 %   MCV 83.2  78.0 - 100.0 fL   MCH 26.8  26.0 - 34.0 pg   MCHC 32.2  30.0 - 36.0 g/dL   RDW 91.4  78.2 - 95.6 %   Platelets 159  150 - 400 K/uL  GLUCOSE, CAPILLARY     Status: None   Collection Time    08/02/12  7:43 AM      Result Value Range   Glucose-Capillary 96  70 - 99 mg/dL    Dg Chest 2 View  03/23/863   *RADIOLOGY REPORT*  Clinical Data: Shortness of breath.  Chest pain.  CHEST - 2 VIEW  Comparison: Radiographs and CT 07/06/2012.  Findings: The heart size and mediastinal contours are stable status post CABG.  The pleural effusions and patchy basilar opacities noted previously have resolved.  The lungs are now clear.  Old rib fractures are present on the left.  IMPRESSION: Resolved pleural effusions and basilar air space opacities.  No acute cardiopulmonary process.   Original Report Authenticated By: Carey Bullocks, M.D.    Review of Systems  Constitutional: Positive for malaise/fatigue. Negative for fever, chills, weight loss and diaphoresis.  HENT: Negative.   Eyes: Negative.   Respiratory: Negative for cough, sputum production and shortness of breath.   Cardiovascular: Positive for leg swelling. Negative for chest pain, palpitations, orthopnea, claudication and PND.  Gastrointestinal: Negative.   Genitourinary: Negative.   Musculoskeletal: Negative.   Skin: Negative.   Neurological: Positive for dizziness. Negative for weakness.  Endo/Heme/Allergies: Negative.   Psychiatric/Behavioral: Negative.    Blood pressure 106/48, pulse 62, temperature 98.3 F (36.8 C), temperature source Oral, resp. rate 16, height 5\' 9"  (1.753 m), weight 99.8 kg (220 lb 0.3 oz), SpO2 98.00%. Physical Exam  Constitutional: He is oriented to person, place, and time. He  appears well-developed and well-nourished. No distress.  HENT:  Head: Normocephalic and atraumatic.  Mouth/Throat:  Oropharynx is clear and moist.  Eyes: Conjunctivae and EOM are normal. Pupils are equal, round, and reactive to light.  Neck: Normal range of motion. Neck supple. No JVD present. No thyromegaly present.  Cardiovascular: Normal rate, regular rhythm, normal heart sounds and intact distal pulses.  Exam reveals no gallop and no friction rub.   No murmur heard. Respiratory: Effort normal and breath sounds normal. No respiratory distress. He has no wheezes. He has no rales.  Old sternotomy scar.  GI: Soft. Bowel sounds are normal. He exhibits no distension and no mass. There is no tenderness.  Musculoskeletal: Normal range of motion. He exhibits no edema and no tenderness.  Lymphadenopathy:    He has no cervical adenopathy.  Neurological: He is alert and oriented to person, place, and time. He has normal strength. No cranial nerve deficit or sensory deficit.  Skin: Skin is warm and dry.  Psychiatric: He has a normal mood and affect.     [facility]                    [address]                     [place], [place] [address]                         [phone]   ------------------------------------------------------------ Transthoracic Echocardiography  Patient:    Areg, Bialas MR #:       [id number] Study Date: 07/12/2012 Gender:     M Age:        68 Height:     175.3cm Weight:     101.6kg BSA:        2.53m^2 Pt. Status: Room:    Lajean Saver, Christeen Douglas, Arlys John  ATTENDING    Dietrich Pates  PERFORMING   [facility]  SONOGRAPHER  Junious Dresser, RDCS cc:  ------------------------------------------------------------ LV EF: 35% -   40%  ------------------------------------------------------------ Indications:      Edema 782.3.  Shortness of breath 786.05.    ------------------------------------------------------------ History:   PMH:  Emphysema. Right pleural effusion. Ascites. Acquired from the patient and from the patient's chart. Dyspnea and bilateral lower extremity edema.  Coronary artery disease.  Chronic obstructive pulmonary disease. PMH:   Myocardial infarction.  Risk factors:  Lifelong nonsmoker. Hypertension. Obese. Dyslipidemia.  ------------------------------------------------------------ Study Conclusions  - Left ventricle: The cavity size was mildly dilated. Wall   thickness was increased in a pattern of mild LVH. Systolic   function was moderately reduced. The estimated ejection   fraction was in the range of 35% to 40%. - Right ventricle: Systolic function was mildly reduced. - Pulmonary arteries: PA peak pressure: 51mm Hg (S).  ------------------------------------------------------------ Labs, prior tests, procedures, and surgery: Coronary artery bypass grafting.  Transthoracic echocardiography.  M-mode, complete 2D, spectral Doppler, and color Doppler.  Height:  Height: 175.3cm. Height: 69in.  Weight:  Weight: 101.6kg. Weight: 223.5lb.  Body mass index:  BMI: 33.1kg/m^2.  Body surface area:    BSA: 2.49m^2.  Blood pressure:     118/70.  Patient status:  Outpatient.  Location:  [facility]  ------------------------------------------------------------  ------------------------------------------------------------ Left ventricle:  The cavity size was mildly dilated. Wall thickness was increased in a pattern of mild LVH. Systolic function was moderately reduced. The estimated ejection fraction was in the range of 35% to 40%.  ------------------------------------------------------------  Aortic valve:   Mildly thickened leaflets.  Doppler:   No significant regurgitation.  ------------------------------------------------------------ Mitral valve:   Mildly thickened leaflets .  Doppler: Trivial regurgitation.     Peak gradient: 3mm Hg (D).  ------------------------------------------------------------ Left atrium:  The atrium was normal in size.  ------------------------------------------------------------ Right ventricle:  The cavity size was mildly dilated. Systolic function was mildly reduced.  ------------------------------------------------------------ Pulmonic valve:    Structurally normal valve.   Cusp separation was normal.  Doppler:  Transvalvular velocity was within the normal range.  No significant regurgitation.  ------------------------------------------------------------ Tricuspid valve:   Structurally normal valve.   Leaflet separation was normal.  Doppler:  Transvalvular velocity was within the normal range.  Mild regurgitation.  ------------------------------------------------------------ Right atrium:  The atrium was normal in size.  ------------------------------------------------------------ Pericardium:  There was no pericardial effusion.  ------------------------------------------------------------  2D measurements        Normal  Doppler measurements   Normal Left ventricle                 Main pulmonary LVID ED,   53.9 mm     43-52   artery chord,                         Pressure,     51 mm Hg =30 PLAX                           S LVID ES,   48.5 mm     23-38   Left ventricle chord,                         Ea, lat     8.99 cm/s  ------ PLAX                           ann, tiss FS, chord,   10 %      >29     DP PLAX                           E/Ea, lat  10.38       ------ LVPW, ED   13.3 mm     ------  ann, tiss IVS/LVPW   0.95        <1.3    DP ratio, ED                      Ea, med     8.22 cm/s  ------ Vol ED,     151 ml     ------  ann, tiss MOD1                           DP Vol ES,      93 ml     ------  E/Ea, med  11.35       ------ MOD1                           ann, tiss EF, MOD1     38 %      ------  DP Vol index,   70 ml/m^2 ------  LVOT ED, MOD1  Peak vel,    103 cm/s  ------ Vol index,   43 ml/m^2 ------  S ES, MOD1                       VTI, S      22.1 cm    ------ Vol ED,     151 ml     ------  Stroke vol   100 ml    ------ MOD2                           Stroke      46.1 ml/m^ ------ Vol ES,      96 ml     ------  index            2 MOD2                           Mitral valve EF, MOD2     36 %      ------  Peak E vel  93.3 cm/s  ------ Stroke       55 ml     ------  Peak A vel    79 cm/s  ------ vol, MOD2                      Decelerati   208 ms    150-23 Vol index,   70 ml/m^2 ------  on time                0 ED, MOD2                       Peak           3 mm Hg ------ Vol index,   44 ml/m^2 ------  gradient, ES, MOD2                       D Stroke     25.3 ml/m^2 ------  Peak E/A     1.2       ------ index,                         ratio MOD2                           Tricuspid valve Ventricular septum             Regurg       319 cm/s  ------ IVS, ED    12.6 mm     ------  peak vel LVOT                           Peak RV-RA    41 mm Hg ------ Diam, S      24 mm     ------  gradient, Area       4.52 cm^2   ------  S Diam         24 mm     ------  Systemic veins Aorta                          Estimated     10 mm Hg ------ Root diam,   39 mm     ------  CVP ED                             Right ventricle AAo AP       40 mm     ------  Pressure,     51 mm Hg <30 diam, S                        S Left atrium                    Sa vel,     9.32 cm/s  ------ AP dim       37 mm     ------  lat ann, AP dim     1.71 cm/m^2 <2.2    tiss DP index   ------------------------------------------------------------ Prepared and Electronically Authenticated by  Dietrich Pates [id number]:[id number]       CARDIAC CATH:  Procedural Findings: Hemodynamics RA 8/6 with a mean of 4 mmHg RV 44/9 mmHg PA 39/12 with a mean of 24 mmHg PCWP 20 5/25 with a mean of 20 mmHg LV 160/16 mm per AO 160/68 with a mean of 103  mmHg  Oxygen saturations: PA 67% AO 95%  Cardiac Output (Fick) 4.3 L per minute  Cardiac Index (Fick) 2 L per minute per meter square            Coronary angiography: Coronary dominance: right  Left mainstem: The left main coronary is calcified. There is a 90% stenosis in the distal left main coronary.  Left anterior descending (LAD): Is a 95% stenosis in the proximal LAD. It is occluded after a small first diagonal branch.  There is a moderate size ramus intermediate branch which is normal.  Left circumflex (LCx): The left circumflex gives rise to a first marginal branch that is occluded. The circumflex then terminates after second marginal branch. It gives rise to collaterals to the right coronary. There are left to left collaterals to the first obtuse marginal vessel.  Right coronary artery (RCA): The right coronary is occluded proximally. There are mild right to right bridging collaterals. There are good left to right collaterals to the distal RCA.  The LIMA graft to the LAD was very difficult to engage. However flush shots demonstrated that the graft was large and patent  Left ventriculography: Left ventricular systolic function is abnormal. Ejection fraction is estimated at 35-40%, the inferior wall is severely hypokinetic. there is no significant mitral regurgitation   Final Conclusions:   1. Severe left main and three-vessel obstructive coronary artery disease. 2. Patent LIMA graft to the LAD 3. Moderate left ventricular dysfunction.  Recommendations: Given his LV dysfunction and significant areas of the myocardium that have not been revascularized would consider redo CABG.   Theron Arista [facility] 07/30/2012, 10:19 AM     Assessment/Plan:  He has high grade left main and severe multivessel coronary disease status post coronary bypass graft surgery x1 in 1994. He has moderate left ventricular dysfunction presenting with 2 years of progressive congestive heart failure  symptoms. I agree that redo coronary bypass graft surgery is indicated to prevent further ischemia and infarction, improve his congestive heart failure symptoms, and improve his quality of life. He was admitted yesterday with a left groin pseudoaneurysm which has been treated with compression. He has had no chest pain or dyspnea and I would like to let this left groin pseudoaneurysm and hematoma stabilize for a week  or so prior to proceeding with surgery if possible. I will tentatively schedule him for surgery on Thursday, July 10 and will have my office call him at home to arrange this. I discussed the operative procedure with the patient and wife including alternatives, benefits and risks; including but not limited to bleeding, blood transfusion, infection, stroke, myocardial infarction, graft failure, heart block requiring a permanent pacemaker, organ dysfunction, and death.  Wardell Honour understands and agrees to proceed.    Alleen Borne 08/02/2012, 12:22 PM

## 2012-08-03 ENCOUNTER — Encounter (HOSPITAL_COMMUNITY): Payer: Self-pay | Admitting: Physician Assistant

## 2012-08-03 ENCOUNTER — Other Ambulatory Visit: Payer: Self-pay | Admitting: *Deleted

## 2012-08-03 ENCOUNTER — Encounter (HOSPITAL_COMMUNITY): Payer: Medicare PPO

## 2012-08-03 DIAGNOSIS — I2510 Atherosclerotic heart disease of native coronary artery without angina pectoris: Secondary | ICD-10-CM

## 2012-08-03 DIAGNOSIS — I724 Aneurysm of artery of lower extremity: Secondary | ICD-10-CM

## 2012-08-03 LAB — CBC
HCT: 30.2 % — ABNORMAL LOW (ref 39.0–52.0)
MCV: 84.4 fL (ref 78.0–100.0)
RBC: 3.58 MIL/uL — ABNORMAL LOW (ref 4.22–5.81)
RDW: 15.4 % (ref 11.5–15.5)
WBC: 8 10*3/uL (ref 4.0–10.5)

## 2012-08-03 LAB — BASIC METABOLIC PANEL
BUN: 23 mg/dL (ref 6–23)
CO2: 28 mEq/L (ref 19–32)
Chloride: 106 mEq/L (ref 96–112)
Creatinine, Ser: 0.85 mg/dL (ref 0.50–1.35)

## 2012-08-03 MED ORDER — ACETAMINOPHEN 500 MG PO TABS: 500.0000 mg | ORAL_TABLET | Freq: Four times a day (QID) | ORAL | Status: DC | PRN

## 2012-08-03 MED ORDER — FUROSEMIDE 40 MG PO TABS: 20.0000 mg | ORAL_TABLET | Freq: Every day | ORAL | Status: DC

## 2012-08-03 MED ORDER — NITROGLYCERIN 0.4 MG SL SUBL: 0.4000 mg | SUBLINGUAL_TABLET | SUBLINGUAL | Status: DC | PRN

## 2012-08-03 MED ORDER — HYDROCODONE-ACETAMINOPHEN 5-325 MG PO TABS: 1.0000 | ORAL_TABLET | Freq: Four times a day (QID) | ORAL | Status: DC | PRN

## 2012-08-03 NOTE — Progress Notes (Signed)
Pre-op Cardiac Surgery  Carotid Findings:    Upper Extremity Right Left  Brachial Pressures 129 126  Radial Waveforms Bi Bi  Ulnar Waveforms Bi Bi  Palmar Arch (Allen's Test) Normal Decreases >50% with radial compression, normal with ulnar compression    Farrel Demark, RDMS, RVT 08/03/2012

## 2012-08-03 NOTE — Progress Notes (Signed)
Patient ID: Joseph Berg, male   DOB: [birth year], 79 y.o.   MRN: [id number] Vascular Surgery Progress Note  Subjective: Pseudoaneurysm of left common femoral artery with hematoma-pseudo-resolved following compression yesterday. Patient states pain in the left inguinal area much improved. Has been stable hemodynamically past 48 hours  Objective:  Filed Vitals:   08/03/12 0900  BP:   Pulse: 75  Temp:   Resp:     General alert and oriented x3 Lungs no rhonchi or wheezing Left inguinal area with diffuse hematoma and left thigh. 3+ femoral and 3+ dorsalis pedis pulse palpable left lower extremity. No pulsatile mass noted in the left inguinal area   Labs:  Recent Labs Lab 08/01/12 0958 08/02/12 0145 08/03/12 0435  CREATININE 0.98 0.86 0.85    Recent Labs Lab 08/01/12 0958 08/02/12 0145 08/03/12 0435  NA 139 138 141  K 3.8 3.8 4.0  CL 103 104 106  CO2 25 25 28   BUN 24* 23 23  CREATININE 0.98 0.86 0.85  GLUCOSE 112* 105* 114*  CALCIUM 9.1 8.7 9.1    Recent Labs Lab 08/02/12 0151 08/02/12 1618 08/03/12 0435  WBC 9.8 7.3 8.0  HGB 10.5* 9.5* 9.7*  HCT 32.6* 29.1* 30.2*  PLT 159 156 177   No results found for this basename: INR,  in the last 168 hours  I/O last 3 completed shifts: In: 4200 [P.O.:1500; I.V.:2700] Out: 1100 [Urine:1100]  Imaging: No results found.  Assessment/Plan:   LOS: 2 days  s/p   Left femoral pseudoaneurysm with resultant hematoma-pseudoaneurysm now resolved post compression yesterday Scheduled for redo coronary artery bypass grafting by Dr. Lavinia Sharps in near future We'll see patient again at your request   Joseph Gip, MD 08/03/2012 10:54 AM

## 2012-08-03 NOTE — Progress Notes (Signed)
Pt had 18 beat run of VT. Asymptomatic. VSS.

## 2012-08-03 NOTE — Progress Notes (Signed)
   Subjective:  Denies CP or dyspnea; complains of left thigh pain (improved).   Objective:  Filed Vitals:   08/03/12 0500 08/03/12 0600 08/03/12 0700 08/03/12 0800  BP: 120/44 122/47 133/52 140/57  Pulse: 70 77 65 72  Temp:    98.9 F (37.2 C)  TempSrc:    Oral  Resp:      Height:      Weight:      SpO2:   91% 98%    Intake/Output from previous day:  Intake/Output Summary (Last 24 hours) at 08/03/12 0902 Last data filed at 08/03/12 0600  Gross per 24 hour  Intake   2655 ml  Output    500 ml  Net   2155 ml    Physical Exam: Physical exam: Well-developed well-nourished in no acute distress.  Skin is warm and dry.  HEENT is normal.  Neck is supple.  Chest is clear to auscultation with normal expansion.  Cardiovascular exam is regular rate and rhythm.  Abdominal exam nontender or distended. No masses palpated. Left groin is ecchymotic, there is a left thigh hematoma. There is a 2+ dorsalis pedis pulse on the left. Extremities show no edema. neuro grossly intact    Lab Results: Basic Metabolic Panel:  Recent Labs  78/29/56 0145 08/03/12 0435  NA 138 141  K 3.8 4.0  CL 104 106  CO2 25 28  GLUCOSE 105* 114*  BUN 23 23  CREATININE 0.86 0.85  CALCIUM 8.7 9.1   CBC:  Recent Labs  08/01/12 0958  08/02/12 1618 08/03/12 0435  WBC 10.6*  < > 7.3 8.0  NEUTROABS 8.3*  --   --   --   HGB 12.2*  < > 9.5* 9.7*  HCT 36.5*  < > 29.1* 30.2*  MCV 82.0  < > 82.9 84.4  PLT 192  < > 156 177  < > = values in this interval not displayed. Cardiac Enzymes:  Recent Labs  08/01/12 1408 08/01/12 2010 08/02/12 0145  TROPONINI <0.30 <0.30 <0.30     Assessment/Plan:  1 left thigh hematoma/pseudoaneurysm-s/p compression of pseudoaneurysm; Hgb unchanged. 2 coronary artery disease status post coronary artery bypass and graft-once he recovers from his hematoma Dr Laneta Simmers plans CABG (7-10) 3 chronic combined systolic/diastolic congestive heart failure-continue present  medications. Euvolemic on examination. DC today on present meds (resume ASA 81 mg daily). CABG 7/10 >30 min PA and physician time D2 Olga Millers 08/03/2012, 9:02 AM

## 2012-08-03 NOTE — Discharge Summary (Signed)
Discharge Summary   Patient ID: Joseph Berg MRN: [id number], DOB/AGE: [birth year] 79 y.o. Admit date: 08/01/2012 D/C date:     08/03/2012  Primary Cardiologist: [name]  Primary Discharge Diagnoses:  1. L thigh hematoma/pseudoaneurysm - s/p compression 08/02/12 2. CAD - awaiting redo CABG 08/16/12 - s/p CABGx1 with LIMA to LAD 1994 3. Chronic combined systolic/diastolic congestive heart failure - EF 35-40% by echo 07/2012 4. NSVT 5. ABL anemia  Secondary Discharge Diagnoses:  1. HTN 2. OSA on CPAP 3. Sciatica 4. Hypoparathyroidism 5. Hyperlipidemia 6. Emphysemia  - Moderate emphysema by CT 06/2012  Hospital Course: Joseph Berg is a 79 y/o M with history of CAD s/p CABGx1 in 1994, HTN, OSA, and combined systolic/diastolic CHF who presented to [facility] 08/01/2012 with near syncope and groin pain post-outpatient-cath. He had recently presented with symptoms of CHF and was found to have EF 35-40%. (He had venous Dopplers last month which showed no evidence of DVT. CT angiogram of the chest showed no evidence of pulmonary embolus.) On 07/30/12, he underwent diagnostic cath given LV dysfunction which demonstrated normal right heart pressures. Pulmonary capillary wedge pressure was 20 mmHg. The LIMA graft to the LAD was patent. The patient also had severe distal left main disease to 90%. The first marginal branch was occluded. The right coronary was occluded proximally with left to right collaterals. Redo CABG was recommended given his LV dysfunction and significant ischemic territories. He was scheduled to see Dr. Laneta Simmers the day of admission, but instead had presented to the ER 08/01/12 with lightheadedness. He initially did well post-cath, but the morning of admission was getting ready to take a shower when he felt extremely lightheaded and felt like he was going to pass out. He also had discomfort in his left groin radiating into his left leg. He denied any significant chest pain  or SOB. EKG was nonacute. Hgb was 12.2 and vitals were stable. His near-syncope was felt related to increased vagal tone with some bruising in his left groin.  Exam was consistent with groin hematoma. Diuretics were held. Femoral ultrasound showed evidence of a pseudoaneurysm (neck measuring 4.65mm width, and 5 mm length. pseudo diameter 3.8cm). Dr. [place] evaluated the patient after these findings given expanding hematoma per nurse report. The patient had had a number of sneezes then had increased pain and swelling. 20 minutes of direct femoral compression was performed manually and the hematoma was softer and less tense. The patient was kept on bedrest. Vascular was asked to evaluate the patient and did not find any evidence of active bleeding. Dr. Hart Rochester recommended repeat duplex scan the following morning with attempt at compression and close vitals monitoring. Pseudoaneurysm compression was performed 08/02/12 and was thrombosed post-15 minutes of compression. Hgb came down to the 9-10 range but remained stable today. He had an 18 beat run of NSVT noted on tele without symptoms; vitals remained stable. Repeat US today after ambulation showed continued closure of pseudoaneurysm. Dr. [name] has seen and examined the patient and feels he is stable for discharge. TCTS saw the patient this admission and plans for CABG 08/16/12 - they will call him at home to arrange this. Dr. [name] did not feel he needed to be seen in our office prior to CABG but the patient is to call if he has any concerns or problems. He had a rolling walker, shower chair and HHPT for safety arranged at discharge.   Discharge Vitals: Blood pressure 134/52, pulse 74, temperature 98.9 F (  37.2 C), temperature source Oral, resp. rate 20, height 5\' 9"  (1.753 m), weight 220 lb 0.3 oz (99.8 kg), SpO2 91.00%.  Labs: Lab Results  Component Value Date   WBC 8.0 08/03/2012   HGB 9.7* 08/03/2012   HCT 30.2* 08/03/2012   MCV 84.4 08/03/2012   PLT  177 08/03/2012     Recent Labs Lab 08/03/12 0435  NA 141  K 4.0  CL 106  CO2 28  BUN 23  CREATININE 0.85  CALCIUM 9.1  GLUCOSE 114*    Recent Labs  08/01/12 0958 08/01/12 1408 08/01/12 2010 08/02/12 0145  TROPONINI <0.30 <0.30 <0.30 <0.30   Lab Results  Component Value Date   CHOL 130 06/22/2011   HDL 31* 06/22/2011   LDLCALC 68 06/22/2011   TRIG 155* 06/22/2011    Diagnostic Studies/Procedures   Dg Chest 2 View  08/01/2012   *RADIOLOGY REPORT*  Clinical Data: Shortness of breath.  Chest pain.  CHEST - 2 VIEW  Comparison: Radiographs and CT 07/06/2012.  Findings: The heart size and mediastinal contours are stable status post CABG.  The pleural effusions and patchy basilar opacities noted previously have resolved.  The lungs are now clear.  Old rib fractures are present on the left.  IMPRESSION: Resolved pleural effusions and basilar air space opacities.  No acute cardiopulmonary process.   Original Report Authenticated By: Carey Bullocks, M.D.   Dg Chest 2 View  07/06/2012   *RADIOLOGY REPORT*  Clinical Data: Shortness of breath with 02/03 weeks, former smoking history  CHEST - 2 VIEW  Comparison: Chest x-ray of 10/31/2011  Findings: Cardiomegaly is again noted.  There is question of very mild pulmonary vascular congestion.  No effusion is seen.  Median sternotomy sutures are noted from prior CABG.  No bony abnormality is seen.  IMPRESSION: Cardiomegaly.  Question minimal pulmonary vascular congestion.   Original Report Authenticated By: Dwyane Dee, M.D.   Ct Angio Chest Pe W/cm &/or Wo Cm  07/06/2012   *RADIOLOGY REPORT*  Clinical Data: Shortness of breath and chest fullness for several weeks.  History of coronary artery disease and hypertension.  CT ANGIOGRAPHY CHEST  Technique:  Multidetector CT imaging of the chest using the standard protocol during bolus administration of intravenous contrast. Multiplanar reconstructed images including MIPs were obtained and reviewed to  evaluate the vascular anatomy.  Contrast: OMNIPAQUE IOHEXOL 350 MG/ML SOLN  Comparison: Plain film of earlier today.  No prior CT.  Findings: Lung windows demonstrate minimal motion degradation. Moderate centrilobular emphysema.  No nodules, airspace opacities.  Soft tissue windows:  The quality of this exam for evaluation of pulmonary embolism is good to excellent.  The bolus is well timed. No evidence of pulmonary embolism.  Lipoma within the left rotator cuff musculature. Normal aortic caliber without dissection.  Mild cardiomegaly with prior median sternotomy.  No pericardial effusion.  Small right pleural effusion.  Enlargement of pulmonary outflow tract, 3.9 cm.  11 mm low right paratracheal node.  No hilar adenopathy.  Limited abdominal imaging demonstrates minimal perihepatic ascites. Remote right rib trauma.  IMPRESSION: 1. No evidence of pulmonary embolism.  2.  Small right pleural effusion.  Concurrent small volume perihepatic ascites, cardiomegaly, and median sternotomy changes. Question mild fluid overload/congestive heart failure.  3.  Mildly enlarged mediastinal node.  Favored to be reactive or related to congestive failure.  4.  Moderate centrilobular emphysema. 5. Pulmonary artery enlargement suggests pulmonary arterial hypertension.   Original Report Authenticated By: Jeronimo Greaves, M.D.  Vascular studies, see EMR   Discharge Medications     Medication List    STOP taking these medications       KRILL OIL PO     metoprolol 100 MG tablet  Commonly known as:  LOPRESSOR      TAKE these medications       acetaminophen 500 MG tablet  Commonly known as:  TYLENOL  Take 1-2 tablets (500-1,000 mg total) by mouth every 6 (six) hours as needed for pain.     aspirin 81 MG EC tablet  Take by mouth daily.     atorvastatin 40 MG tablet  Commonly known as:  LIPITOR  Take 1 tablet (40 mg total) by mouth daily.     carvedilol 12.5 MG tablet  Commonly known as:  COREG  Take 1  tablet (12.5 mg total) by mouth 2 (two) times daily.     cholecalciferol 1000 UNITS tablet  Commonly known as:  VITAMIN D  Take 1,000 Units by mouth daily.     dutasteride 0.5 MG capsule  Commonly known as:  AVODART  Take 1 capsule (0.5 mg total) by mouth daily.     fenofibrate 145 MG tablet  Commonly known as:  TRICOR  Take 1 tablet (145 mg total) by mouth daily.     fluticasone 50 MCG/ACT nasal spray  Commonly known as:  FLONASE  Place 2 sprays into the nose daily.     furosemide 40 MG tablet  Commonly known as:  LASIX  Take 0.5 tablets (20 mg total) by mouth daily.     HYDROcodone-acetaminophen 5-325 MG per tablet  Commonly known as:  NORCO/VICODIN  Take 1-2 tablets by mouth every 6 (six) hours as needed (for moderate to severe pain).     losartan 100 MG tablet  Commonly known as:  COZAAR  Take 50 mg by mouth daily.     multivitamin with minerals tablet  Take 1 tablet by mouth daily.     nitroGLYCERIN 0.4 MG SL tablet  Commonly known as:  NITROSTAT  Place 1 tablet (0.4 mg total) under the tongue every 5 (five) minutes as needed for chest pain (up to 3 doses).     VITAMIN B-12 PO  Take 1 tablet by mouth daily.      Krill oil was stopped due to potential for potentiation of bleeding.  Disposition   The patient will be discharged in stable condition to home. Discharge Orders   Future Appointments Provider Department Dept Phone   08/09/2012 12:00 PM Mc-Respt[facility] Tech [facility] RESPIRATORY THERAPY [phone]   08/09/2012 1:00 PM Mc-Dahoc Dennie Bible 1 [facility] SAME DAY SURGERY [phone]   08/13/2012 1:45 PM Agapito Games, MD [place] [facility] AT [facility] [phone]   09/12/2012 2:45 PM Lewayne Bunting, MD [facility] at [facility] [phone]   10/29/2012 9:30 AM Agapito Games, MD [place] [facility] AT [facility] [place] [phone]   Future Orders Complete By Expires     Diet - low sodium  heart healthy  As directed     Discharge instructions  As directed     Comments:      Do not exceed more than 4,000mg  of Tylenol (acetaminophen) per day - from ALL sources including other pain medications.    Increase activity slowly  As directed     Comments:      No driving until cleared by your heart doctor. No lifting over 5 lbs until cleared by  your heart doctor. No sexual activity until cleared by your heart doctor. Keep procedure site clean & dry. If you notice increased pain, swelling, bleeding or pus, call/return!  You may shower, but no soaking baths/hot tubs/pools for 1 week.      Follow-up Information   Follow up with Olga Millers, MD. (If you have any questions or concerns about your catheterization site, please call!)    Contact information:   [address] [place] [address] [phone]       Follow up with Alleen Borne, MD. (Dr. Sharee Pimple office will call you to arrange follow-up appointment. Call his office if you haven't heard from them within 3 days.)    Contact information:   [address] [employer] [address] [place] [address] [phone]         Duration of Discharge Encounter: Greater than 30 minutes including physician and PA time.  Signed, Dayna Dunn PA-C 08/03/2012, 2:15 PM

## 2012-08-03 NOTE — Progress Notes (Signed)
Pre-op Cardiac Surgery  Carotid Findings:  Bilateral - 40% to 59% ICA stenosis lowest end of range. Vertebral artery flow is antegrade.  Upper Extremity Right Left  Brachial Pressures 129 126  Radial Waveforms Bi Bi  Ulnar Waveforms Bi Bi  Palmar Arch (Allen's Test) Normal Decreases >50% with radial compression, normal with ulnar compression    Farrel Demark, RDMS, RVT 08/03/2012  Toma Deiters, RVS 08/03/2012

## 2012-08-03 NOTE — Progress Notes (Addendum)
PRELIMINARY  Left groin pseudoaneurysm recheck has been completed.  The pseudo remains thrombosed since compression 6/26. Patient states he has not walked much (only to bathroom). Our protocol is usually to have patient walk down hall a few times before recheck to make sure pseudo did not reopen.   Farrel Demark, RDMS, RVT 08/03/2012   Second left groin pseudoaneurysm recheck has been completed post extended ambulation. The pseudoaneurysm still remains thrombosed. Report called to [facility], RVS 08/03/2012 1510

## 2012-08-03 NOTE — Discharge Summary (Signed)
See progress notes Joseph Berg  

## 2012-08-03 NOTE — Progress Notes (Addendum)
CARDIAC REHAB PHASE I   PRE:  Rate/Rhythm: 72 SR    BP: sitting 134/52    SaO2: 89 RA  MODE:  Ambulation: 60 ft   POST:  Rate/Rhythm: 84 SR    BP: sitting 111/56     SaO2: 91 RA  Pt left groin sore and "tight". Struggled to get to EOB but was able with moderate assist. Discussed with wife how to help. Able to stand with RW in front of him. Left leg weak. Used RW, assist x2 to walk. Able to walk without limping, etc. Tired after walk. To recliner. Asked for pain meds. SaO2 87-91 RA walking. Denied SOB. Has O2 at home although pt usually refuses to wear it. Discussed preparing for sternal precautions, mobility, and practicing IS at home preop. Set up preop video. Pt needs RW for home, [facility] PT and also is requesting a shower chair. Wife very supportive.  [phone]  Elissa Lovett [place] CES, ACSM 08/03/2012 12:37 PM

## 2012-08-07 ENCOUNTER — Telehealth: Payer: Self-pay | Admitting: *Deleted

## 2012-08-07 ENCOUNTER — Telehealth: Payer: Self-pay | Admitting: Cardiology

## 2012-08-07 NOTE — Telephone Encounter (Signed)
New problem  Joseph Berg from [facility], wants to verify that he will be the physician to sign off on his plan of care.  Patient also has O2 at home and she wants to verify that he is suppose to use it.

## 2012-08-07 NOTE — Telephone Encounter (Signed)
That is fine 

## 2012-08-07 NOTE — Telephone Encounter (Signed)
Left message for jerilyn, dr [name] has agreed to sign orders. Do not see anything in discharge summ about oxygen. She will call back with questions.

## 2012-08-07 NOTE — Telephone Encounter (Signed)
Left detailed message on vm.

## 2012-08-07 NOTE — Telephone Encounter (Signed)
[facility] is calling today just to verify that you will still be over the patient's home health needs.

## 2012-08-09 ENCOUNTER — Other Ambulatory Visit (HOSPITAL_COMMUNITY): Payer: Medicare PPO

## 2012-08-09 ENCOUNTER — Ambulatory Visit (HOSPITAL_COMMUNITY)
Admission: RE | Admit: 2012-08-09 | Discharge: 2012-08-09 | Disposition: A | Discharge status: Home / Self Care | Payer: Medicare PPO | Source: Ambulatory Visit | Attending: Surgery | Admitting: Surgery

## 2012-08-09 ENCOUNTER — Ambulatory Visit (HOSPITAL_COMMUNITY)
Admit: 2012-08-09 | Discharge: 2012-08-09 | Disposition: A | Discharge status: Home / Self Care | Payer: Medicare PPO | Attending: Surgery | Admitting: Surgery

## 2012-08-09 ENCOUNTER — Encounter (HOSPITAL_COMMUNITY): Payer: Self-pay

## 2012-08-09 ENCOUNTER — Encounter (HOSPITAL_COMMUNITY)
Admit: 2012-08-09 | Discharge: 2012-08-09 | Disposition: A | Discharge status: Home / Self Care | Payer: Medicare PPO | Attending: Surgery | Admitting: Surgery

## 2012-08-09 VITALS — BP 128/80 | HR 76 | Temp 98.6°F | Resp 18 | Ht 69.0 in | Wt 221.2 lb

## 2012-08-09 DIAGNOSIS — Z01811 Encounter for preprocedural respiratory examination: Secondary | ICD-10-CM | POA: Insufficient documentation

## 2012-08-09 DIAGNOSIS — Z01810 Encounter for preprocedural cardiovascular examination: Secondary | ICD-10-CM | POA: Insufficient documentation

## 2012-08-09 DIAGNOSIS — I2510 Atherosclerotic heart disease of native coronary artery without angina pectoris: Secondary | ICD-10-CM | POA: Insufficient documentation

## 2012-08-09 DIAGNOSIS — Z01818 Encounter for other preprocedural examination: Secondary | ICD-10-CM | POA: Insufficient documentation

## 2012-08-09 DIAGNOSIS — Z01812 Encounter for preprocedural laboratory examination: Secondary | ICD-10-CM | POA: Insufficient documentation

## 2012-08-09 LAB — APTT: aPTT: 36 seconds (ref 24–37)

## 2012-08-09 LAB — PROTIME-INR
INR: 1.1 (ref 0.00–1.49)
Prothrombin Time: 14 seconds (ref 11.6–15.2)

## 2012-08-09 LAB — COMPREHENSIVE METABOLIC PANEL
ALT: 31 U/L (ref 0–53)
AST: 37 U/L (ref 0–37)
Alkaline Phosphatase: 35 U/L — ABNORMAL LOW (ref 39–117)
Calcium: 10 mg/dL (ref 8.4–10.5)
GFR calc Af Amer: 90 mL/min — ABNORMAL LOW (ref >90)
Glucose, Bld: 112 mg/dL — ABNORMAL HIGH (ref 70–99)
Potassium: 3.8 mEq/L (ref 3.5–5.1)
Sodium: 140 mEq/L (ref 135–145)
Total Protein: 6.6 g/dL (ref 6.0–8.3)

## 2012-08-09 LAB — URINALYSIS, ROUTINE W REFLEX MICROSCOPIC
Bilirubin Urine: NEGATIVE
Hgb urine dipstick: NEGATIVE
Ketones, ur: NEGATIVE mg/dL
Nitrite: NEGATIVE
Protein, ur: NEGATIVE mg/dL
Specific Gravity, Urine: 1.016 (ref 1.005–1.030)
Urobilinogen, UA: 2 mg/dL — ABNORMAL HIGH (ref 0.0–1.0)

## 2012-08-09 LAB — PULMONARY FUNCTION TEST

## 2012-08-09 LAB — CBC
Hemoglobin: 11.3 g/dL — ABNORMAL LOW (ref 13.0–17.0)
MCH: 27.5 pg (ref 26.0–34.0)
MCHC: 32.9 g/dL (ref 30.0–36.0)
Platelets: 286 10*3/uL (ref 150–400)
RBC: 4.11 MIL/uL — ABNORMAL LOW (ref 4.22–5.81)

## 2012-08-09 LAB — BLOOD GAS, ARTERIAL
Bicarbonate: 27.8 mEq/L — ABNORMAL HIGH (ref 20.0–24.0)
Drawn by: 344381
FIO2: 0.21 %
Patient temperature: 98.6
pH, Arterial: 7.461 — ABNORMAL HIGH (ref 7.350–7.450)
pO2, Arterial: 69.9 mmHg — ABNORMAL LOW (ref 80.0–100.0)

## 2012-08-09 MED ORDER — ALBUTEROL SULFATE (5 MG/ML) 0.5% IN NEBU
2.5000 mg | INHALATION_SOLUTION | Freq: Once | RESPIRATORY_TRACT | Status: AC
  Administered 2012-08-09: 2.5 mg via RESPIRATORY_TRACT

## 2012-08-09 NOTE — Progress Notes (Signed)
Xray tech approached Nurse and questioned Nurse about patient having another chest xray since his last xray was 08/01/12. Nurse then called Darius Bump and asked her about xray. Alycia Rossetti stated that patient needed to have a repeat chest xray per Dr. Laneta Simmers. Xray tech notified of this.

## 2012-08-09 NOTE — Pre-Procedure Instructions (Addendum)
BYRNE CAPEK  08/09/2012   Your procedure is scheduled on:  Tuesday, July 8th.  Report to [facility] at 5:30AM.             Take Charlotta Newton to 3rd Floor- Short Stay.  Call this number if you have problems the morning of surgery: [phone]   Remember:   Do not eat food or drink liquids after midnight.   Take these medicines the morning of surgery with A SIP OF WATER: carvedilol (COREG),dutasteridr(AVODART).  May use: fluticasone (FLONASE). May rake if needed: NTG,HYDROcodone-acetaminophen (NORCO/VICOdin)           Do not wear jewelry, make-up or nail po Do not wear lotions, powders, or perfumes. You may wear deodorant.   Men may shave face and neck.  Do not bring valuables to the hospital.  [facility] is not responsible for any belongings or valuables.  Contacts, dentures or bridgework may not be worn into surgery.  Leave suitcase in the car. After surgery it may be brought to your room.  For patients admitted to the hospital, checkout time is 11:00 AM the day of discharge.     Special Instructions: Shower using CHG 2 nights before surgery and the night before surgery.  If you shower the day of surgery use CHG.  Use special wash - you have one bottle of CHG for all showers.  You should use approximately 1/3 of the bottle for each shower.   Please read over the following fact sheets that you were given: Pain Booklet, Coughing and Deep Breathing, Blood Transfusion Information and Surgical Site Infection Prevention

## 2012-08-13 ENCOUNTER — Ambulatory Visit: Payer: Medicare PPO | Admitting: Family Medicine

## 2012-08-13 MED ORDER — CHLORHEXIDINE GLUCONATE 4 % EX LIQD: 30.0000 mL | CUTANEOUS | Status: DC

## 2012-08-13 MED ORDER — NITROGLYCERIN IN D5W 200-5 MCG/ML-% IV SOLN
2.0000 ug/min | INTRAVENOUS | Status: AC
  Administered 2012-08-14: 16 ug/min via INTRAVENOUS
  Filled 2012-08-13 (×2): qty 250

## 2012-08-13 MED ORDER — CEFUROXIME SODIUM 750 MG IJ SOLR
750.0000 mg | INTRAMUSCULAR | Status: DC
  Filled 2012-08-13 (×2): qty 750

## 2012-08-13 MED ORDER — DEXTROSE 5 % IV SOLN
1.5000 g | INTRAVENOUS | Status: AC
Start: 2012-08-14 — End: 2012-08-14
  Administered 2012-08-14: 1.5 g via INTRAVENOUS
  Administered 2012-08-14: .75 g via INTRAVENOUS
  Filled 2012-08-13 (×2): qty 1.5

## 2012-08-13 MED ORDER — DEXMEDETOMIDINE HCL IN NACL 400 MCG/100ML IV SOLN
0.1000 ug/kg/h | INTRAVENOUS | Status: AC
  Administered 2012-08-14: 0.2 ug/kg/h via INTRAVENOUS
  Filled 2012-08-13 (×2): qty 100

## 2012-08-13 MED ORDER — METOPROLOL TARTRATE 12.5 MG HALF TABLET: 12.5000 mg | ORAL_TABLET | Freq: Once | ORAL | Status: DC

## 2012-08-13 MED ORDER — VANCOMYCIN HCL 10 G IV SOLR
1250.0000 mg | INTRAVENOUS | Status: AC
  Administered 2012-08-14: 1250 mg via INTRAVENOUS
  Filled 2012-08-13 (×2): qty 1250

## 2012-08-13 MED ORDER — MAGNESIUM SULFATE 50 % IJ SOLN
40.0000 meq | INTRAMUSCULAR | Status: DC
  Filled 2012-08-13 (×2): qty 10

## 2012-08-13 MED ORDER — AMINOCAPROIC ACID 250 MG/ML IV SOLN
INTRAVENOUS | Status: AC
  Administered 2012-08-14: 70 mL/h via INTRAVENOUS
  Filled 2012-08-13 (×2): qty 40

## 2012-08-13 MED ORDER — DOPAMINE-DEXTROSE 3.2-5 MG/ML-% IV SOLN
2.0000 ug/kg/min | INTRAVENOUS | Status: AC
  Administered 2012-08-14: 3 ug/kg/min via INTRAVENOUS
  Filled 2012-08-13: qty 250

## 2012-08-13 MED ORDER — PHENYLEPHRINE HCL 10 MG/ML IJ SOLN
30.0000 ug/min | INTRAVENOUS | Status: AC
  Administered 2012-08-14: 40 ug/min via INTRAVENOUS
  Administered 2012-08-14: 5 ug/min via INTRAVENOUS
  Filled 2012-08-13 (×2): qty 2

## 2012-08-13 MED ORDER — SODIUM CHLORIDE 0.9 % IV SOLN
INTRAVENOUS | Status: DC
  Filled 2012-08-13 (×2): qty 30

## 2012-08-13 MED ORDER — PLASMA-LYTE 148 IV SOLN
INTRAVENOUS | Status: AC
  Administered 2012-08-14: 10:00:00
  Filled 2012-08-13 (×2): qty 2.5

## 2012-08-13 MED ORDER — EPINEPHRINE HCL 1 MG/ML IJ SOLN
0.5000 ug/min | INTRAVENOUS | Status: DC
  Filled 2012-08-13 (×2): qty 4

## 2012-08-13 MED ORDER — POTASSIUM CHLORIDE 2 MEQ/ML IV SOLN
80.0000 meq | INTRAVENOUS | Status: DC
  Filled 2012-08-13 (×2): qty 40

## 2012-08-13 MED ORDER — SODIUM CHLORIDE 0.9 % IV SOLN
INTRAVENOUS | Status: AC
  Administered 2012-08-14: 1 [IU]/h via INTRAVENOUS
  Filled 2012-08-13 (×2): qty 1

## 2012-08-14 ENCOUNTER — Inpatient Hospital Stay (HOSPITAL_COMMUNITY): Payer: Medicare PPO

## 2012-08-14 ENCOUNTER — Encounter (HOSPITAL_COMMUNITY): Payer: Self-pay | Admitting: *Deleted

## 2012-08-14 ENCOUNTER — Encounter (HOSPITAL_COMMUNITY)
Admission: RE | Disposition: A | Discharge status: Home Health | Payer: Self-pay | Source: Ambulatory Visit | Attending: Surgery

## 2012-08-14 ENCOUNTER — Inpatient Hospital Stay (HOSPITAL_COMMUNITY): Payer: Medicare PPO | Admitting: Certified Registered"

## 2012-08-14 ENCOUNTER — Inpatient Hospital Stay (HOSPITAL_COMMUNITY)
Admission: RE | Admit: 2012-08-14 | Discharge: 2012-08-18 | DRG: 236 | Disposition: A | Discharge status: Home Health | Payer: Medicare PPO | Source: Ambulatory Visit | Attending: Surgery | Admitting: Surgery

## 2012-08-14 ENCOUNTER — Encounter (HOSPITAL_COMMUNITY): Payer: Self-pay | Admitting: Certified Registered"

## 2012-08-14 DIAGNOSIS — N400 Enlarged prostate without lower urinary tract symptoms: Secondary | ICD-10-CM | POA: Diagnosis present

## 2012-08-14 DIAGNOSIS — I509 Heart failure, unspecified: Secondary | ICD-10-CM | POA: Diagnosis present

## 2012-08-14 DIAGNOSIS — Z8249 Family history of ischemic heart disease and other diseases of the circulatory system: Secondary | ICD-10-CM

## 2012-08-14 DIAGNOSIS — I252 Old myocardial infarction: Secondary | ICD-10-CM

## 2012-08-14 DIAGNOSIS — M5430 Sciatica, unspecified side: Secondary | ICD-10-CM | POA: Diagnosis present

## 2012-08-14 DIAGNOSIS — Z79899 Other long term (current) drug therapy: Secondary | ICD-10-CM

## 2012-08-14 DIAGNOSIS — J9819 Other pulmonary collapse: Secondary | ICD-10-CM | POA: Diagnosis not present

## 2012-08-14 DIAGNOSIS — J438 Other emphysema: Secondary | ICD-10-CM | POA: Diagnosis present

## 2012-08-14 DIAGNOSIS — I2510 Atherosclerotic heart disease of native coronary artery without angina pectoris: Principal | ICD-10-CM

## 2012-08-14 DIAGNOSIS — Z951 Presence of aortocoronary bypass graft: Secondary | ICD-10-CM

## 2012-08-14 DIAGNOSIS — H5460 Unqualified visual loss, one eye, unspecified: Secondary | ICD-10-CM | POA: Diagnosis present

## 2012-08-14 DIAGNOSIS — Z823 Family history of stroke: Secondary | ICD-10-CM

## 2012-08-14 DIAGNOSIS — I2589 Other forms of chronic ischemic heart disease: Secondary | ICD-10-CM | POA: Diagnosis present

## 2012-08-14 DIAGNOSIS — K5900 Constipation, unspecified: Secondary | ICD-10-CM | POA: Diagnosis not present

## 2012-08-14 DIAGNOSIS — E119 Type 2 diabetes mellitus without complications: Secondary | ICD-10-CM | POA: Diagnosis present

## 2012-08-14 DIAGNOSIS — I10 Essential (primary) hypertension: Secondary | ICD-10-CM | POA: Diagnosis present

## 2012-08-14 DIAGNOSIS — E785 Hyperlipidemia, unspecified: Secondary | ICD-10-CM | POA: Diagnosis present

## 2012-08-14 DIAGNOSIS — I2582 Chronic total occlusion of coronary artery: Secondary | ICD-10-CM | POA: Diagnosis present

## 2012-08-14 DIAGNOSIS — E209 Hypoparathyroidism, unspecified: Secondary | ICD-10-CM | POA: Diagnosis present

## 2012-08-14 DIAGNOSIS — G4733 Obstructive sleep apnea (adult) (pediatric): Secondary | ICD-10-CM | POA: Diagnosis present

## 2012-08-14 DIAGNOSIS — Z7982 Long term (current) use of aspirin: Secondary | ICD-10-CM

## 2012-08-14 DIAGNOSIS — Z9849 Cataract extraction status, unspecified eye: Secondary | ICD-10-CM

## 2012-08-14 DIAGNOSIS — Z87891 Personal history of nicotine dependence: Secondary | ICD-10-CM

## 2012-08-14 DIAGNOSIS — I359 Nonrheumatic aortic valve disorder, unspecified: Secondary | ICD-10-CM | POA: Diagnosis present

## 2012-08-14 DIAGNOSIS — I059 Rheumatic mitral valve disease, unspecified: Secondary | ICD-10-CM | POA: Diagnosis present

## 2012-08-14 HISTORY — PX: CORONARY ARTERY BYPASS GRAFT: SHX141

## 2012-08-14 HISTORY — PX: INTRAOPERATIVE TRANSESOPHAGEAL ECHOCARDIOGRAM: SHX5062

## 2012-08-14 LAB — POCT I-STAT 3, ART BLOOD GAS (G3+)
Acid-base deficit: 2 mmol/L (ref 0.0–2.0)
Bicarbonate: 26.1 mEq/L — ABNORMAL HIGH (ref 20.0–24.0)
Bicarbonate: 23 mEq/L (ref 20.0–24.0)
Bicarbonate: 23.9 mEq/L (ref 20.0–24.0)
O2 Saturation: 100 %
O2 Saturation: 99 %
Patient temperature: 36.2
Patient temperature: 37
Patient temperature: 37
TCO2: 29 mmol/L (ref 0–100)
TCO2: 27 mmol/L (ref 0–100)
TCO2: 24 mmol/L (ref 0–100)
pCO2 arterial: 50.9 mmHg — ABNORMAL HIGH (ref 35.0–45.0)
pCO2 arterial: 41.7 mmHg (ref 35.0–45.0)
pCO2 arterial: 48.5 mmHg — ABNORMAL HIGH (ref 35.0–45.0)
pH, Arterial: 7.371 (ref 7.350–7.450)
pH, Arterial: 7.284 — ABNORMAL LOW (ref 7.350–7.450)
pH, Arterial: 7.349 — ABNORMAL LOW (ref 7.350–7.450)
pH, Arterial: 7.315 — ABNORMAL LOW (ref 7.350–7.450)
pH, Arterial: 7.296 — ABNORMAL LOW (ref 7.350–7.450)
pO2, Arterial: 142 mmHg — ABNORMAL HIGH (ref 80.0–100.0)
pO2, Arterial: 95 mmHg (ref 80.0–100.0)

## 2012-08-14 LAB — POCT I-STAT, CHEM 8
BUN: 23 mg/dL (ref 6–23)
Calcium, Ion: 1.24 mmol/L (ref 1.13–1.30)
HCT: 28 % — ABNORMAL LOW (ref 39.0–52.0)
Sodium: 139 mEq/L (ref 135–145)
TCO2: 22 mmol/L (ref 0–100)

## 2012-08-14 LAB — POCT I-STAT 4, (NA,K, GLUC, HGB,HCT)
Glucose, Bld: 120 mg/dL — ABNORMAL HIGH (ref 70–99)
Glucose, Bld: 111 mg/dL — ABNORMAL HIGH (ref 70–99)
Glucose, Bld: 135 mg/dL — ABNORMAL HIGH (ref 70–99)
HCT: 21 % — ABNORMAL LOW (ref 39.0–52.0)
HCT: 31 % — ABNORMAL LOW (ref 39.0–52.0)
Hemoglobin: 10.5 g/dL — ABNORMAL LOW (ref 13.0–17.0)
Potassium: 3.8 mEq/L (ref 3.5–5.1)
Potassium: 3.9 mEq/L (ref 3.5–5.1)
Potassium: 4.5 mEq/L (ref 3.5–5.1)
Sodium: 141 mEq/L (ref 135–145)
Sodium: 137 mEq/L (ref 135–145)

## 2012-08-14 LAB — CREATININE, SERUM
GFR calc Af Amer: >90 mL/min (ref >90)
GFR calc non Af Amer: 82 mL/min — ABNORMAL LOW (ref >90)

## 2012-08-14 LAB — CBC
HCT: 30.1 % — ABNORMAL LOW (ref 39.0–52.0)
Hemoglobin: 9.3 g/dL — ABNORMAL LOW (ref 13.0–17.0)
MCH: 28 pg (ref 26.0–34.0)
MCH: 27.5 pg (ref 26.0–34.0)
MCHC: 33.2 g/dL (ref 30.0–36.0)
MCV: 84.3 fL (ref 78.0–100.0)
MCV: 83.4 fL (ref 78.0–100.0)
Platelets: 177 10*3/uL (ref 150–400)
RBC: 3.38 MIL/uL — ABNORMAL LOW (ref 4.22–5.81)
RDW: 17.6 % — ABNORMAL HIGH (ref 11.5–15.5)
WBC: 14.3 10*3/uL — ABNORMAL HIGH (ref 4.0–10.5)

## 2012-08-14 LAB — POCT I-STAT GLUCOSE
Glucose, Bld: 112 mg/dL — ABNORMAL HIGH (ref 70–99)
Operator id: 230421

## 2012-08-14 LAB — MAGNESIUM: Magnesium: 2.6 mg/dL — ABNORMAL HIGH (ref 1.5–2.5)

## 2012-08-14 SURGERY — REDO CORONARY ARTERY BYPASS GRAFTING (CABG)
Anesthesia: General | Site: Chest | Wound class: Clean

## 2012-08-14 MED ORDER — ONDANSETRON HCL 4 MG/2ML IJ SOLN: 4.0000 mg | Freq: Four times a day (QID) | INTRAMUSCULAR | Status: DC | PRN

## 2012-08-14 MED ORDER — ACETAMINOPHEN 500 MG PO TABS
1000.0000 mg | ORAL_TABLET | Freq: Four times a day (QID) | ORAL | Status: DC
  Administered 2012-08-15 – 2012-08-16 (×7): 1000 mg via ORAL
  Filled 2012-08-14 (×9): qty 2

## 2012-08-14 MED ORDER — DUTASTERIDE 0.5 MG PO CAPS
0.5000 mg | ORAL_CAPSULE | Freq: Every day | ORAL | Status: DC
  Administered 2012-08-15 – 2012-08-18 (×3): 0.5 mg via ORAL
  Filled 2012-08-14 (×6): qty 1

## 2012-08-14 MED ORDER — LACTATED RINGERS IV SOLN: 500.0000 mL | Freq: Once | INTRAVENOUS | Status: AC | PRN

## 2012-08-14 MED ORDER — THROMBIN 20000 UNITS EX SOLR
CUTANEOUS | Status: AC
  Filled 2012-08-14: qty 20000

## 2012-08-14 MED ORDER — FENTANYL CITRATE 0.05 MG/ML IJ SOLN
INTRAMUSCULAR | Status: DC | PRN
  Administered 2012-08-14: 250 ug via INTRAVENOUS
  Administered 2012-08-14: 50 ug via INTRAVENOUS
  Administered 2012-08-14: 500 ug via INTRAVENOUS
  Administered 2012-08-14: 50 ug via INTRAVENOUS
  Administered 2012-08-14: 250 ug via INTRAVENOUS
  Administered 2012-08-14: 50 ug via INTRAVENOUS
  Administered 2012-08-14: 100 ug via INTRAVENOUS
  Administered 2012-08-14: 250 ug via INTRAVENOUS

## 2012-08-14 MED ORDER — LACTATED RINGERS IV SOLN
INTRAVENOUS | Status: DC | PRN
  Administered 2012-08-14 (×2): via INTRAVENOUS

## 2012-08-14 MED ORDER — MORPHINE SULFATE 2 MG/ML IJ SOLN
1.0000 mg | INTRAMUSCULAR | Status: AC | PRN
  Administered 2012-08-14: 4 mg via INTRAVENOUS
  Filled 2012-08-14: qty 2

## 2012-08-14 MED ORDER — ALBUMIN HUMAN 5 % IV SOLN
250.0000 mL | INTRAVENOUS | Status: AC | PRN
  Administered 2012-08-14: 250 mL via INTRAVENOUS

## 2012-08-14 MED ORDER — PROTAMINE SULFATE 10 MG/ML IV SOLN
INTRAVENOUS | Status: DC | PRN
  Administered 2012-08-14: 300 mg via INTRAVENOUS

## 2012-08-14 MED ORDER — VITAMIN B-12 100 MCG PO TABS
100.0000 ug | ORAL_TABLET | Freq: Every day | ORAL | Status: DC
  Administered 2012-08-15 – 2012-08-18 (×4): 100 ug via ORAL
  Filled 2012-08-14 (×5): qty 1

## 2012-08-14 MED ORDER — SODIUM CHLORIDE 0.9 % IJ SOLN: 3.0000 mL | INTRAMUSCULAR | Status: DC | PRN

## 2012-08-14 MED ORDER — THROMBIN 20000 UNITS EX SOLR
OROMUCOSAL | Status: DC | PRN
  Administered 2012-08-14 (×3): via TOPICAL

## 2012-08-14 MED ORDER — SODIUM CHLORIDE 0.9 % IV SOLN
INTRAVENOUS | Status: DC
  Administered 2012-08-14: 20 mL/h via INTRAVENOUS
  Administered 2012-08-14: 23:00:00 via INTRAVENOUS

## 2012-08-14 MED ORDER — PROPOFOL 10 MG/ML IV BOLUS
INTRAVENOUS | Status: DC | PRN
  Administered 2012-08-14: 80 mg via INTRAVENOUS

## 2012-08-14 MED ORDER — DEXMEDETOMIDINE HCL IN NACL 200 MCG/50ML IV SOLN
0.4000 ug/kg/h | INTRAVENOUS | Status: DC
  Filled 2012-08-14: qty 50

## 2012-08-14 MED ORDER — THROMBIN 20000 UNITS EX KIT
PACK | CUTANEOUS | Status: DC | PRN
  Administered 2012-08-14: 20000 [IU] via TOPICAL

## 2012-08-14 MED ORDER — MAGNESIUM SULFATE 40 MG/ML IJ SOLN
4.0000 g | Freq: Once | INTRAMUSCULAR | Status: AC
  Administered 2012-08-14: 4 g via INTRAVENOUS
  Filled 2012-08-14: qty 100

## 2012-08-14 MED ORDER — VITAMIN D3 25 MCG (1000 UNIT) PO TABS
1000.0000 [IU] | ORAL_TABLET | Freq: Every day | ORAL | Status: DC
  Administered 2012-08-15 – 2012-08-18 (×4): 1000 [IU] via ORAL
  Filled 2012-08-14 (×5): qty 1

## 2012-08-14 MED ORDER — DEXMEDETOMIDINE HCL IN NACL 200 MCG/50ML IV SOLN
0.1000 ug/kg/h | INTRAVENOUS | Status: DC
  Administered 2012-08-14: 0.7 ug/kg/h via INTRAVENOUS

## 2012-08-14 MED ORDER — LIDOCAINE HCL (CARDIAC) 20 MG/ML IV SOLN
INTRAVENOUS | Status: DC | PRN
  Administered 2012-08-14: 100 mg via INTRAVENOUS

## 2012-08-14 MED ORDER — LACTATED RINGERS IV SOLN
INTRAVENOUS | Status: DC
  Administered 2012-08-14: 20 mL/h via INTRAVENOUS

## 2012-08-14 MED ORDER — VECURONIUM BROMIDE 10 MG IV SOLR
INTRAVENOUS | Status: DC | PRN
  Administered 2012-08-14: 4 mg via INTRAVENOUS
  Administered 2012-08-14: 6 mg via INTRAVENOUS
  Administered 2012-08-14: 10 mg via INTRAVENOUS

## 2012-08-14 MED ORDER — DEXTROSE 5 % IV SOLN
1.5000 g | Freq: Two times a day (BID) | INTRAVENOUS | Status: AC
  Administered 2012-08-14 – 2012-08-16 (×4): 1.5 g via INTRAVENOUS
  Filled 2012-08-14 (×4): qty 1.5

## 2012-08-14 MED ORDER — FAMOTIDINE IN NACL 20-0.9 MG/50ML-% IV SOLN
20.0000 mg | Freq: Two times a day (BID) | INTRAVENOUS | Status: AC
  Administered 2012-08-14: 20 mg via INTRAVENOUS

## 2012-08-14 MED ORDER — ASPIRIN 81 MG PO CHEW: 324.0000 mg | CHEWABLE_TABLET | Freq: Every day | ORAL | Status: DC

## 2012-08-14 MED ORDER — MIDAZOLAM HCL 5 MG/5ML IJ SOLN
INTRAMUSCULAR | Status: DC | PRN
  Administered 2012-08-14: 3 mg via INTRAVENOUS
  Administered 2012-08-14: 1 mg via INTRAVENOUS
  Administered 2012-08-14: 4 mg via INTRAVENOUS
  Administered 2012-08-14: 3 mg via INTRAVENOUS
  Administered 2012-08-14: 2 mg via INTRAVENOUS
  Administered 2012-08-14: 1 mg via INTRAVENOUS

## 2012-08-14 MED ORDER — NITROGLYCERIN IN D5W 200-5 MCG/ML-% IV SOLN: 0.0000 ug/min | INTRAVENOUS | Status: DC

## 2012-08-14 MED ORDER — POTASSIUM CHLORIDE 10 MEQ/50ML IV SOLN
10.0000 meq | INTRAVENOUS | Status: AC
  Administered 2012-08-14 (×3): 10 meq via INTRAVENOUS
  Filled 2012-08-14: qty 150

## 2012-08-14 MED ORDER — ALBUMIN HUMAN 5 % IV SOLN
INTRAVENOUS | Status: DC | PRN
  Administered 2012-08-14: 12.5 g
  Administered 2012-08-14: 13:00:00 via INTRAVENOUS

## 2012-08-14 MED ORDER — LACTATED RINGERS IV SOLN
INTRAVENOUS | Status: DC | PRN
  Administered 2012-08-14: 07:00:00 via INTRAVENOUS

## 2012-08-14 MED ORDER — INSULIN REGULAR BOLUS VIA INFUSION
0.0000 [IU] | Freq: Three times a day (TID) | INTRAVENOUS | Status: DC
  Filled 2012-08-14: qty 10

## 2012-08-14 MED ORDER — METOPROLOL TARTRATE 12.5 MG HALF TABLET
12.5000 mg | ORAL_TABLET | Freq: Two times a day (BID) | ORAL | Status: DC
  Administered 2012-08-15 – 2012-08-16 (×2): 12.5 mg via ORAL
  Filled 2012-08-14 (×5): qty 1

## 2012-08-14 MED ORDER — BISACODYL 5 MG PO TBEC
10.0000 mg | DELAYED_RELEASE_TABLET | Freq: Every day | ORAL | Status: DC
  Administered 2012-08-15: 10 mg via ORAL
  Filled 2012-08-14: qty 2

## 2012-08-14 MED ORDER — METOPROLOL TARTRATE 25 MG/10 ML ORAL SUSPENSION
12.5000 mg | Freq: Two times a day (BID) | ORAL | Status: DC
  Filled 2012-08-14 (×5): qty 5

## 2012-08-14 MED ORDER — 0.9 % SODIUM CHLORIDE (POUR BTL) OPTIME
TOPICAL | Status: DC | PRN
  Administered 2012-08-14: 6000 mL

## 2012-08-14 MED ORDER — ATORVASTATIN CALCIUM 40 MG PO TABS
40.0000 mg | ORAL_TABLET | Freq: Every day | ORAL | Status: DC
  Administered 2012-08-16 – 2012-08-17 (×2): 40 mg via ORAL
  Filled 2012-08-14 (×5): qty 1

## 2012-08-14 MED ORDER — PANTOPRAZOLE SODIUM 40 MG PO TBEC
40.0000 mg | DELAYED_RELEASE_TABLET | Freq: Every day | ORAL | Status: DC
  Administered 2012-08-16: 40 mg via ORAL
  Filled 2012-08-14: qty 1

## 2012-08-14 MED ORDER — SODIUM CHLORIDE 0.9 % IJ SOLN
3.0000 mL | Freq: Two times a day (BID) | INTRAMUSCULAR | Status: DC
  Administered 2012-08-15: 10:00:00 via INTRAVENOUS
  Administered 2012-08-15 – 2012-08-16 (×2): 3 mL via INTRAVENOUS

## 2012-08-14 MED ORDER — ACETAMINOPHEN 10 MG/ML IV SOLN
1000.0000 mg | Freq: Once | INTRAVENOUS | Status: AC
  Administered 2012-08-14: 1000 mg via INTRAVENOUS
  Filled 2012-08-14: qty 100

## 2012-08-14 MED ORDER — SODIUM CHLORIDE 0.45 % IV SOLN
INTRAVENOUS | Status: DC
  Administered 2012-08-14: 20 mL/h via INTRAVENOUS

## 2012-08-14 MED ORDER — SODIUM CHLORIDE 0.9 % IV SOLN
INTRAVENOUS | Status: DC
  Filled 2012-08-14 (×2): qty 1

## 2012-08-14 MED ORDER — ROCURONIUM BROMIDE 100 MG/10ML IV SOLN
INTRAVENOUS | Status: DC | PRN
  Administered 2012-08-14: 50 mg via INTRAVENOUS

## 2012-08-14 MED ORDER — OXYCODONE HCL 5 MG PO TABS
5.0000 mg | ORAL_TABLET | ORAL | Status: DC | PRN
  Administered 2012-08-14 – 2012-08-15 (×3): 5 mg via ORAL
  Administered 2012-08-15 – 2012-08-16 (×4): 10 mg via ORAL
  Filled 2012-08-14: qty 2
  Filled 2012-08-14 (×2): qty 1
  Filled 2012-08-14 (×3): qty 2
  Filled 2012-08-14: qty 1

## 2012-08-14 MED ORDER — SODIUM CHLORIDE 0.9 % IV SOLN: 250.0000 mL | INTRAVENOUS | Status: DC

## 2012-08-14 MED ORDER — SODIUM CHLORIDE 0.9 % IV SOLN
0.5000 g/h | INTRAVENOUS | Status: DC
  Filled 2012-08-14: qty 20

## 2012-08-14 MED ORDER — MIDAZOLAM HCL 2 MG/2ML IJ SOLN: 2.0000 mg | INTRAMUSCULAR | Status: DC | PRN

## 2012-08-14 MED ORDER — BISACODYL 10 MG RE SUPP: 10.0000 mg | Freq: Every day | RECTAL | Status: DC

## 2012-08-14 MED ORDER — FENOFIBRATE 160 MG PO TABS
160.0000 mg | ORAL_TABLET | Freq: Every day | ORAL | Status: DC
  Administered 2012-08-15 – 2012-08-18 (×4): 160 mg via ORAL
  Filled 2012-08-14 (×5): qty 1

## 2012-08-14 MED ORDER — VANCOMYCIN HCL IN DEXTROSE 1-5 GM/200ML-% IV SOLN
1000.0000 mg | Freq: Once | INTRAVENOUS | Status: AC
  Administered 2012-08-14: 1000 mg via INTRAVENOUS
  Filled 2012-08-14: qty 200

## 2012-08-14 MED ORDER — METOPROLOL TARTRATE 1 MG/ML IV SOLN: 2.5000 mg | INTRAVENOUS | Status: DC | PRN

## 2012-08-14 MED ORDER — ACETAMINOPHEN 160 MG/5ML PO SOLN
975.0000 mg | Freq: Four times a day (QID) | ORAL | Status: DC
  Filled 2012-08-14: qty 40

## 2012-08-14 MED ORDER — PHENYLEPHRINE HCL 10 MG/ML IJ SOLN
0.0000 ug/min | INTRAVENOUS | Status: DC
  Filled 2012-08-14: qty 2

## 2012-08-14 MED ORDER — ASPIRIN EC 325 MG PO TBEC
325.0000 mg | DELAYED_RELEASE_TABLET | Freq: Every day | ORAL | Status: DC
  Administered 2012-08-15 – 2012-08-16 (×2): 325 mg via ORAL
  Filled 2012-08-14 (×2): qty 1

## 2012-08-14 MED ORDER — MORPHINE SULFATE 2 MG/ML IJ SOLN: 2.0000 mg | INTRAMUSCULAR | Status: DC | PRN

## 2012-08-14 MED ORDER — METOCLOPRAMIDE HCL 5 MG/ML IJ SOLN
10.0000 mg | Freq: Four times a day (QID) | INTRAMUSCULAR | Status: AC
  Administered 2012-08-14 – 2012-08-15 (×4): 10 mg via INTRAVENOUS
  Filled 2012-08-14 (×5): qty 2

## 2012-08-14 MED ORDER — DOCUSATE SODIUM 100 MG PO CAPS
200.0000 mg | ORAL_CAPSULE | Freq: Every day | ORAL | Status: DC
  Administered 2012-08-15: 200 mg via ORAL
  Filled 2012-08-14: qty 2

## 2012-08-14 MED ORDER — DOPAMINE-DEXTROSE 3.2-5 MG/ML-% IV SOLN: 3.0000 ug/kg/min | INTRAVENOUS | Status: DC

## 2012-08-14 MED ORDER — HEPARIN SODIUM (PORCINE) 1000 UNIT/ML IJ SOLN
INTRAMUSCULAR | Status: DC | PRN
  Administered 2012-08-14: 45000 [IU] via INTRAVENOUS

## 2012-08-14 MED ORDER — SODIUM CHLORIDE 0.9 % IV SOLN
INTRAVENOUS | Status: DC | PRN
  Administered 2012-08-14: 12:00:00 via INTRAVENOUS

## 2012-08-14 SURGICAL SUPPLY — 94 items
ADAPTER CARDIO PERF ANTE/RETRO (ADAPTER) ×3 IMPLANT
ATTRACTOMAT 16X20 MAGNETIC DRP (DRAPES) ×3 IMPLANT
BAG DECANTER FOR FLEXI CONT (MISCELLANEOUS) ×3 IMPLANT
BANDAGE ELASTIC 4 VELCRO ST LF (GAUZE/BANDAGES/DRESSINGS) ×3 IMPLANT
BANDAGE ELASTIC 6 VELCRO ST LF (GAUZE/BANDAGES/DRESSINGS) ×3 IMPLANT
BANDAGE GAUZE ELAST BULKY 4 IN (GAUZE/BANDAGES/DRESSINGS) ×3 IMPLANT
BLADE CORE FAN STRYKER (BLADE) ×3 IMPLANT
BLADE SURG 11 STRL SS (BLADE) ×3 IMPLANT
BLADE SURG ROTATE 9660 (MISCELLANEOUS) IMPLANT
CANISTER SUCTION 2500CC (MISCELLANEOUS) ×3 IMPLANT
CANNULA GUNDRY RCSP 15FR (MISCELLANEOUS) IMPLANT
CATH ROBINSON RED A/P 18FR (CATHETERS) ×9 IMPLANT
CATH THORACIC 28FR (CATHETERS) ×3 IMPLANT
CATH THORACIC 36FR (CATHETERS) ×3 IMPLANT
CATH THORACIC 36FR RT ANG (CATHETERS) ×3 IMPLANT
CLIP FOGARTY SPRING 6M (CLIP) IMPLANT
CLIP TI MEDIUM 24 (CLIP) ×3 IMPLANT
CLIP TI WIDE RED SMALL 24 (CLIP) ×3 IMPLANT
CLOTH BEACON ORANGE TIMEOUT ST (SAFETY) ×3 IMPLANT
COVER SURGICAL LIGHT HANDLE (MISCELLANEOUS) ×6 IMPLANT
CRADLE DONUT ADULT HEAD (MISCELLANEOUS) ×3 IMPLANT
DRAPE CARDIOVASCULAR INCISE (DRAPES) ×1
DRAPE SLUSH/WARMER DISC (DRAPES) IMPLANT
DRAPE SRG 135X102X78XABS (DRAPES) ×2 IMPLANT
DRSG COVADERM 4X14 (GAUZE/BANDAGES/DRESSINGS) ×3 IMPLANT
ELECT CAUTERY BLADE 6.4 (BLADE) ×3 IMPLANT
ELECT REM PT RETURN 9FT ADLT (ELECTROSURGICAL) ×6
ELECTRODE REM PT RTRN 9FT ADLT (ELECTROSURGICAL) ×4 IMPLANT
GLOVE BIO SURGEON STRL SZ 6 (GLOVE) ×18 IMPLANT
GLOVE BIO SURGEON STRL SZ 6.5 (GLOVE) ×6 IMPLANT
GLOVE BIO SURGEON STRL SZ7 (GLOVE) IMPLANT
GLOVE BIO SURGEON STRL SZ7.5 (GLOVE) IMPLANT
GLOVE BIO SURGEON STRL SZ8.5 (GLOVE) ×3 IMPLANT
GLOVE BIOGEL PI IND STRL 6 (GLOVE) IMPLANT
GLOVE BIOGEL PI IND STRL 6.5 (GLOVE) ×4 IMPLANT
GLOVE BIOGEL PI IND STRL 7.0 (GLOVE) ×14 IMPLANT
GLOVE BIOGEL PI INDICATOR 6 (GLOVE)
GLOVE BIOGEL PI INDICATOR 6.5 (GLOVE) ×2
GLOVE BIOGEL PI INDICATOR 7.0 (GLOVE) ×7
GLOVE EUDERMIC 7 POWDERFREE (GLOVE) ×9 IMPLANT
GLOVE ORTHO TXT STRL SZ7.5 (GLOVE) IMPLANT
GOWN PREVENTION PLUS XLARGE (GOWN DISPOSABLE) ×3 IMPLANT
GOWN STRL NON-REIN LRG LVL3 (GOWN DISPOSABLE) ×30 IMPLANT
HEMOSTAT POWDER SURGIFOAM 1G (HEMOSTASIS) ×9 IMPLANT
HEMOSTAT SURGICEL 2X14 (HEMOSTASIS) ×3 IMPLANT
INSERT FOGARTY 61MM (MISCELLANEOUS) ×3 IMPLANT
INSERT FOGARTY XLG (MISCELLANEOUS) IMPLANT
KIT BASIN OR (CUSTOM PROCEDURE TRAY) ×3 IMPLANT
KIT CATH CPB BARTLE (MISCELLANEOUS) ×3 IMPLANT
KIT ROOM TURNOVER OR (KITS) ×3 IMPLANT
KIT SUCTION CATH 14FR (SUCTIONS) ×3 IMPLANT
KIT VASOVIEW W/TROCAR VH 2000 (KITS) ×3 IMPLANT
LOOP VESSEL MAXI BLUE (MISCELLANEOUS) ×3 IMPLANT
NS IRRIG 1000ML POUR BTL (IV SOLUTION) ×15 IMPLANT
PACK OPEN HEART (CUSTOM PROCEDURE TRAY) ×3 IMPLANT
PAD ARMBOARD 7.5X6 YLW CONV (MISCELLANEOUS) ×6 IMPLANT
PAD DEFIB R2 (MISCELLANEOUS) ×3 IMPLANT
PAD ELECT DEFIB RADIOL ZOLL (MISCELLANEOUS) ×3 IMPLANT
PENCIL BUTTON HOLSTER BLD 10FT (ELECTRODE) ×6 IMPLANT
PUNCH AORTIC ROTATE  4.5MM 8IN (MISCELLANEOUS) ×3 IMPLANT
PUNCH AORTIC ROTATE 4.0MM (MISCELLANEOUS) IMPLANT
PUNCH AORTIC ROTATE 4.5MM 8IN (MISCELLANEOUS) ×3 IMPLANT
PUNCH AORTIC ROTATE 5MM 8IN (MISCELLANEOUS) IMPLANT
SET CARDIOPLEGIA MPS 5001102 (MISCELLANEOUS) ×3 IMPLANT
SOLUTION ANTI FOG 6CC (MISCELLANEOUS) ×3 IMPLANT
SPONGE GAUZE 4X4 12PLY (GAUZE/BANDAGES/DRESSINGS) ×6 IMPLANT
SPONGE INTESTINAL PEANUT (DISPOSABLE) IMPLANT
SPONGE LAP 18X18 X RAY DECT (DISPOSABLE) ×9 IMPLANT
SPONGE LAP 4X18 X RAY DECT (DISPOSABLE) ×3 IMPLANT
SUT BONE WAX W31G (SUTURE) ×3 IMPLANT
SUT ETHILON 3 0 FSL (SUTURE) ×3 IMPLANT
SUT MNCRL AB 4-0 PS2 18 (SUTURE) ×3 IMPLANT
SUT PROLENE 3 0 SH DA (SUTURE) IMPLANT
SUT PROLENE 3 0 SH1 36 (SUTURE) ×3 IMPLANT
SUT PROLENE 4 0 RB 1 (SUTURE) ×1
SUT PROLENE 4-0 RB1 .5 CRCL 36 (SUTURE) ×2 IMPLANT
SUT PROLENE 7 0 BV1 MDA (SUTURE) ×6 IMPLANT
SUT PROLENE BLUE 7 0 (SUTURE) IMPLANT
SUT SILK 2 0 SH CR/8 (SUTURE) ×3 IMPLANT
SUT SILK 2 0 TIES 10X30 (SUTURE) ×3 IMPLANT
SUT VIC AB 1 CTX 36 (SUTURE) ×2
SUT VIC AB 1 CTX36XBRD ANBCTR (SUTURE) ×4 IMPLANT
SUT VIC AB 2-0 CT1 27 (SUTURE) ×1
SUT VIC AB 2-0 CT1 TAPERPNT 27 (SUTURE) ×2 IMPLANT
SUTURE E-PAK OPEN HEART (SUTURE) ×3 IMPLANT
SYSTEM SAHARA CHEST DRAIN ATS (WOUND CARE) ×3 IMPLANT
TAPE CLOTH SURG 4X10 WHT LF (GAUZE/BANDAGES/DRESSINGS) ×3 IMPLANT
TOWEL OR 17X24 6PK STRL BLUE (TOWEL DISPOSABLE) ×3 IMPLANT
TOWEL OR 17X26 10 PK STRL BLUE (TOWEL DISPOSABLE) ×3 IMPLANT
TRAY FOLEY IC TEMP SENS 14FR (CATHETERS) ×3 IMPLANT
TUBE SUCT INTRACARD DLP 20F (MISCELLANEOUS) ×3 IMPLANT
TUBING INSUFFLATION 10FT LAP (TUBING) ×3 IMPLANT
UNDERPAD 30X30 INCONTINENT (UNDERPADS AND DIAPERS) ×3 IMPLANT
WATER STERILE IRR 1000ML POUR (IV SOLUTION) ×6 IMPLANT

## 2012-08-14 NOTE — Interval H&P Note (Signed)
History and Physical Interval Note:  08/14/2012 7:08 AM  Joseph Berg  has presented today for surgery, with the diagnosis of CAD  The various methods of treatment have been discussed with the patient and family. After consideration of risks, benefits and other options for treatment, the patient has consented to  Procedure(s): REDO CORONARY ARTERY BYPASS GRAFTING (CABG) (N/A) INTRAOPERATIVE TRANSESOPHAGEAL ECHOCARDIOGRAM (N/A) as a surgical intervention .  The patient's history has been reviewed, patient examined, no change in status, stable for surgery.  I have reviewed the patient's chart and labs.  Questions were answered to the patient's satisfaction.     Alleen Borne

## 2012-08-14 NOTE — Anesthesia Procedure Notes (Signed)
Procedure Name: Intubation Date/Time: 08/14/2012 7:58 AM Performed by: Armandina Gemma Pre-anesthesia Checklist: Patient identified, Emergency Drugs available, Timeout performed, Suction available and Patient being monitored Patient Re-evaluated:Patient Re-evaluated prior to inductionOxygen Delivery Method: Circle system utilized Preoxygenation: Pre-oxygenation with 100% oxygen Intubation Type: IV induction Ventilation: Mask ventilation without difficulty Laryngoscope Size: Miller and 2 Grade View: Grade I Tube type: Oral Tube size: 8.0 mm Number of attempts: 1 Airway Equipment and Method: Stylet Placement Confirmation: ETT inserted through vocal cords under direct vision,  breath sounds checked- equal and bilateral and positive ETCO2 Secured at: 23 cm Tube secured with: Tape Dental Injury: Teeth and Oropharynx as per pre-operative assessment  Comments: IV induction Germerroth MDA- intubation AM CRNA atraumatic

## 2012-08-14 NOTE — Progress Notes (Signed)
[facility]  Patient Status: Active (receiving services up to time of hospitalization)  [facility] is providing the following services: PT  If patient discharges after hours, please call [phone].   Jodene Nam 08/14/2012, 5:56 PM

## 2012-08-14 NOTE — Progress Notes (Signed)
T. CTS p.m. Rounds  Recovering from redo CABG x2 Hemodynamic stable heart rate 60-70, atrially paced at 94 blood pressure Not bleeding with good urine output Labs reviewed and are satisfactory

## 2012-08-14 NOTE — Progress Notes (Signed)
Second step to wean

## 2012-08-14 NOTE — Op Note (Signed)
CARDIOVASCULAR SURGERY OPERATIVE NOTE  08/14/2012 Joseph Berg [id number]  Surgeon:  Alleen Borne, MD  First Assistant: Lowella Dandy,  [facility]   Preoperative Diagnosis:  Severe Left main and multi-vessel coronary artery disease s/p CABG x1 with LIMA to LAD in 1994   Postoperative Diagnosis:  Same   Procedure:  1. Redo Median Sternotomy 2. Extracorporeal circulation 3.   Redo Coronary artery bypass grafting x 2   SVG to Ramus  SVG to PDA 4.   Endoscopic vein harvest from the right leg   Anesthesia:  General Endotracheal   Clinical History/Surgical Indication:  The patient is a 79 year old gentleman with a history of hypertension and coronary disease status post coronary bypass graft surgery x1 using a left internal mammary graft to the LAD in 1994 in [place], [place]. Apparently had a myocardial infarction prior to that surgery. He recently presented with a 2 year history of progressive congestive heart failure symptoms with worsening lower extremity edema. He denies any shortness of breath although his wife said that he has been very sedentary and has been short of breath. He was seen by his primary physician and workup was begun. He had lower extremity venous Dopplers which were negative for DVT. He had a CT angiogram the chest showed no evidence of pulmonary embolus with a small right pleural effusion. He was started on Lasix and home oxygen and his symptoms gradually improved. His wife said he lost about 38 pounds of fluid. An echocardiogram earlier this month showed an ejection fraction of 35-40% with mild left ventricular hypertrophy and mild right ventricular dysfunction. He was referred to Dr. [name] and underwent cardiac catheterization on 07/30/2012. This showed 90% stenosis in the distal left main coronary artery. The LAD had 95% proximal stenosis and it was occluded after the  small first diagonal branch. The distal vessel was supplied by a patent left internal mammary graft. There is a moderate-sized intermediate branch that had no disease. Left circumflex had a small-to-moderate size first marginal branch was occluded and filled by collaterals. It terminated after a second marginal branch which was always also a small to moderate-sized vessel. It gives collaterals to the right coronary which was occluded proximally. Left ventricular ejection fraction was 35-40% with severe inferior hypokinesis and no significant mitral regurgitation. Right heart pressures were within normal limits. Cardiac index was 2 L per minute and his pulmonary artery oxygen saturation was 67%. He has moderate left ventricular dysfunction presenting with 2 years of progressive congestive heart failure symptoms. I agree that redo coronary bypass graft surgery is indicated to prevent further ischemia and infarction, improve his congestive heart failure symptoms, and improve his quality of life. I discussed the operative procedure with the patient and wife including alternatives, benefits and risks; including but not limited to bleeding, blood transfusion, infection, stroke, myocardial infarction, graft failure, heart block requiring a permanent pacemaker, organ dysfunction, and death. Joseph Berg understands and agrees to proceed.        Preparation:  The patient was seen in the preoperative holding area and the correct patient, correct operation were confirmed with the patient after reviewing the medical record and catheterization. The consent was signed by me. Preoperative antibiotics were given. A pulmonary arterial line and radial arterial line were placed by the anesthesia team. The patient was taken back to the operating room and positioned supine on the operating room table. After being placed under general endotracheal anesthesia by the anesthesia team a foley catheter was placed. The  neck,  chest, abdomen, and both legs were prepped with betadine soap and solution and draped in the usual sterile manner. A surgical time-out was taken and the correct patient and operative procedure were confirmed with the nursing and anesthesia staff.   Cardiopulmonary Bypass:  A median sternotomy was performed using the oscillating saw after removing the sternal wires. The sternal halves were retracted with bone hooks and the heart was dissected from the back of the sternum. The LIMA was immediately noticed as it was adherant to the back of the upper sternum. It was carefully avoided. It looked normal with no plaque. The LIMA was encircled as it reached the heart to allow clamping. Dissection was begun to expose the right atrium and ascending aorta. The ascending aorta was of normal size and had no palpable plaque. There were no contraindications to aortic cannulation or cross-clamping. The patient was fully systemically heparinized and the ACT was maintained > 400 sec. The proximal aortic arch was cannulated with a 22 F aortic cannula for arterial inflow. Venous cannulation was performed via the right atrial appendage using a two-staged venous cannula. An antegrade cardioplegia/vent cannula was inserted into the mid-ascending aorta. A retrograde cardioplegia cannula was placed. Aortic occlusion was performed with a single cross-clamp. The LIMA was clamped with an atraumatic plastic bulldog. Systemic cooling to 32 degrees Centigrade and topical cooling of the heart with iced saline were used. Hyperkalemic antegrade cold blood cardioplegia was used to induce diastolic arrest followed by retrograde cold blood cardioplegia which  was then given at about 20 minute intervals throughout the period of arrest to maintain myocardial temperature at or below 10 degrees centigrade. A temperature probe was inserted into the interventricular septum and an insulating pad was placed in the pericardium.    Endoscopic vein  harvest:  The right greater saphenous vein was harvested endoscopically through a 2 cm incision medial to the right knee. It was harvested from the upper thigh to below the knee. It was a medium-sized vein of good quality. The side branches were all ligated with 4-0 silk ties.    Coronary arteries:  The coronary arteries were examined.    LCX   The Ramus was a moderate-sized vessel with minimal disease. The OM1 was small and diffusely diseased and not felt to be graftable. OM2 was small and also diffusely diseased. It was not a very good vessel to try to graft and would be perfused via the Ramus graft.  RCA:  Diffusely diseased. The PDA and PL branches were both small, diffusely diseased and the PDA looked slightly better so it was grafted.   Grafts:    1. SVG to Ramus:  1.6 mm. It was sewn end to side using 7-0 prolene continuous suture. 2. SVG to PDA :  1.5 mm. It was sewn end to side using 7-0 prolene continuous suture.  The proximal vein graft anastomoses were performed to the mid-ascending aorta using continuous 6-0 prolene suture. Graft markers were placed around the proximal anastomoses.   Completion:  The patient was rewarmed to 37 degrees Centigrade. The clamp was removed from the LIMA pedicle and there was rapid warming of the septum and return of ventricular fibrillation. The crossclamp was removed with a time of 73  minutes. There was spontaneous return of sinus rhythm. The distal and proximal anastomoses were checked for hemostasis. The position of the grafts was satisfactory. Two temporary epicardial pacing wires were placed on the right atrium and two on the right ventricle. The  patient was weaned from CPB without difficulty on no inotropes. CPB time was 129 minutes. Cardiac output was 4 LPM. Heparin was fully reversed with protamine and the aortic and venous cannulas removed. Hemostasis was achieved. Mediastinal and left pleural drainage tubes were placed. The sternum was  closed with double #6 stainless steel wires. The fascia was closed with continuous # 1 vicryl suture. The subcutaneous tissue was closed with 2-0 vicryl continuous suture. The skin was closed with 3-0 vicryl subcuticular suture. All sponge, needle, and instrument counts were reported correct at the end of the case. Dry sterile dressings were placed over the incisions and around the chest tubes which were connected to pleurevac suction. The patient was then transported to the surgical intensive care unit in critical but stable condition.

## 2012-08-14 NOTE — Anesthesia Preprocedure Evaluation (Addendum)
Anesthesia Evaluation  Patient identified by MRN, date of birth, ID band Patient awake    Reviewed: Allergy & Precautions, H&P , NPO status , Patient's Chart, lab work & pertinent test results  Airway       Dental  (+) Dental Advisory Given   Pulmonary sleep apnea and Continuous Positive Airway Pressure Ventilation , COPD         Cardiovascular hypertension, Pt. on medications and Pt. on home beta blockers + CAD, + Past MI and +CHF + dysrhythmias Supra Ventricular Tachycardia  Echo 07/12/2012 - Left ventricle: The cavity size was mildly dilated. Wall   thickness was increased in a pattern of mild LVH. Systolic  function was moderately reduced. The estimated ejection  fraction was in the range of 35% to 40%. - Right ventricle: Systolic function was mildly reduced. - Pulmonary arteries: PA peak pressure: 51mm Hg (S).    Neuro/Psych negative neurological ROS  negative psych ROS   GI/Hepatic negative GI ROS, Neg liver ROS,   Endo/Other  negative endocrine ROS  Renal/GU negative Renal ROS     Musculoskeletal negative musculoskeletal ROS (+)   Abdominal (+) + obese,   Peds  Hematology negative hematology ROS (+)   Anesthesia Other Findings   Reproductive/Obstetrics                          Anesthesia Physical Anesthesia Plan  ASA: IV  Anesthesia Plan: General   Post-op Pain Management:    Induction: Intravenous  Airway Management Planned: Oral ETT  Additional Equipment: Arterial line, TEE, CVP, PA Cath and Ultrasound Guidance Line Placement  Intra-op Plan:   Post-operative Plan: Post-operative intubation/ventilation  Informed Consent: I have reviewed the patients History and Physical, chart, labs and discussed the procedure including the risks, benefits and alternatives for the proposed anesthesia with the patient or authorized representative who has indicated his/her understanding and  acceptance.   Dental advisory given  Plan Discussed with: CRNA  Anesthesia Plan Comments:         Anesthesia Quick Evaluation

## 2012-08-14 NOTE — H&P (Signed)
[address]       [place] [address]             [phone]      History and Physical:  Reason for Consult: Severe left main and multi-vessel coronary disease   Referring Physician: Dr. Peter Swaziland   Joseph Berg is an 79 y.o. male.  HPI:  The patient is a 79 year old gentleman with a history of hypertension and coronary disease status post coronary bypass graft surgery x1 using a left internal mammary graft to the LAD in 1994 in [place], [place]. Apparently had a myocardial infarction prior to that surgery. He recently presented with a 2 year history of progressive congestive heart failure symptoms with worsening lower extremity edema. He denies any shortness of breath although his wife said that he has been very sedentary and has been short of breath. He was seen by his primary physician and workup was begun. He had lower extremity venous Dopplers which were negative for DVT. He had a CT angiogram the chest showed no evidence of pulmonary embolus with a small right pleural effusion. He was started on Lasix and home oxygen and his symptoms gradually improved. His wife said he lost about 38 pounds of fluid. An echocardiogram earlier this month showed an ejection fraction of 35-40% with mild left ventricular hypertrophy and mild right ventricular dysfunction. He was referred to Dr. [name] and underwent cardiac catheterization on 07/30/2012. This showed 90% stenosis in the distal left main coronary artery. The LAD had 95% proximal stenosis and it was occluded after the small first diagonal branch. The distal vessel was supplied by a patent left internal mammary graft. There is a moderate-sized intermediate branch that had no disease. Left circumflex had a small-to-moderate size first marginal branch was occluded and filled by collaterals. It terminated after a second marginal branch which was always also a small to moderate-sized vessel. It gives collaterals to the  right coronary which was occluded proximally. Left ventricular ejection fraction was 35-40% with severe inferior hypokinesis and no significant mitral regurgitation. Right heart pressures were within normal limits. Cardiac index was 2 L per minute and his pulmonary artery oxygen saturation was 67%. He was scheduled to see me in the office on Wednesday but was admitted Wednesday morning after an episode of severe dizziness early in the morning after awakening. He was diagnosed with a left groin pseudoaneurysm which subsequently resulted in a large left thigh hematoma. The pseudoaneurysm was treated earlier today with compression and thrombosed after 15 minutes of compression.   Past Medical History   Diagnosis  Date   .  Heart attack  1994   .  Hypertension    .  CAD (coronary artery disease)      a. s/p CABG x1 with LIMA-LAD 1994. b. LM & 3v CAD by cath 07/2012.   .  OSA on CPAP    .  Sciatica      Qualifier: Diagnosis of By: Linford Arnold MD, Santina Evans   .  Hypoparathyroidism    .  BPH (benign prostatic hyperplasia)    .  Hyperlipidemia    .  CHF (congestive heart failure)      a. EF 35-40% by echo 07/2012.   .  Emphysema      a. Moderate emphysema by CT 06/2012.    Past Surgical History   Procedure  Laterality  Date   .  Coronary artery bypass graft   03-23-92   .  Cataract extraction, bilateral      Family History   Problem  Relation  Age of Onset   .  Heart attack  Father  65   .  Stroke  Brother  92   .  Hypertension  Brother    .  Hyperlipidemia  Brother     Social History: reports that he quit smoking about 21 months ago. He does not have any smokeless tobacco history on file. He reports that drinks alcohol. He reports that he does not use illicit drugs.   Allergies:  Allergies   Allergen  Reactions   .  Hctz (Hydrochlorothiazide)  Other (See Comments)     Affected renal function.    Medications:  I have reviewed the patient's current medications.  Prior to Admission:    Prescriptions prior to admission   Medication  Sig  Dispense  Refill   .  acetaminophen (TYLENOL) 500 MG tablet  Take 1,000 mg by mouth every 6 (six) hours as needed for pain.     [name]  aspirin 81 MG EC tablet  Take by mouth daily.     [name]  atorvastatin (LIPITOR) 40 MG tablet  Take 1 tablet (40 mg total) by mouth daily.  90 tablet  3   .  cholecalciferol (VITAMIN D) 1000 UNITS tablet  Take 1,000 Units by mouth daily.     .  Cyanocobalamin (VITAMIN B-12 PO)  Take 1 tablet by mouth daily.     [name]  dutasteride (AVODART) 0.5 MG capsule  Take 1 capsule (0.5 mg total) by mouth daily.  30 capsule  5   .  fenofibrate (TRICOR) 145 MG tablet  Take 1 tablet (145 mg total) by mouth daily.  90 tablet  3   .  fluticasone (FLONASE) 50 MCG/ACT nasal spray  Place 2 sprays into the nose daily.  16 g  1   .  furosemide (LASIX) 40 MG tablet  Take 20 mg by mouth daily.     [name]  KRILL OIL PO  Take 2-3 tablets by mouth daily.     [name]  losartan (COZAAR) 100 MG tablet  Take 50 mg by mouth daily.     .  metoprolol (LOPRESSOR) 100 MG tablet  Take 100 mg by mouth 2 (two) times daily.     .  Multiple Vitamins-Minerals (MULTIVITAMIN WITH MINERALS) tablet  Take 1 tablet by mouth daily.     .  carvedilol (COREG) 12.5 MG tablet  Take 1 tablet (12.5 mg total) by mouth 2 (two) times daily.  180 tablet  3   Scheduled:  .  antiseptic oral rinse  15 mL  Mouth Rinse  BID   .  atorvastatin  40 mg  Oral  q1800   .  carvedilol  12.5 mg  Oral  BID WC   .  cholecalciferol  1,000 Units  Oral  Daily   .  dutasteride  0.5 mg  Oral  Daily   .  fenofibrate  160 mg  Oral  Daily   .  fluticasone  2 spray  Each Nare  Daily   .  furosemide  20 mg  Oral  Daily   .  hydrALAZINE  25 mg  Oral  Q8H   .  losartan  50 mg  Oral  Daily   .  sodium chloride  3 mL  Intravenous  Q12H   .  vitamin B-12  100 mcg  Oral  Daily   Continuous:  .  sodium chloride  75 mL/hr at 08/01/12 1900   AVW:UJWJXB chloride, acetaminophen, hydrALAZINE,  HYDROcodone-acetaminophen, LORazepam, morphine injection, morphine injection, nitroGLYCERIN, ondansetron (ZOFRAN) IV, sodium chloride  Anti-infectives    None      Results for orders placed during the hospital encounter of 08/01/12 (from the past 48 hour(s))   CBC WITH DIFFERENTIAL Status: Abnormal    Collection Time    08/01/12 9:58 AM   Result  Value  Range    WBC  10.6 (*)  4.0 - 10.5 K/uL    RBC  4.45  4.22 - 5.81 MIL/uL    Hemoglobin  12.2 (*)  13.0 - 17.0 g/dL    HCT  14.7 (*)  82.9 - 52.0 %    MCV  82.0  78.0 - 100.0 fL    MCH  27.4  26.0 - 34.0 pg    MCHC  33.4  30.0 - 36.0 g/dL    RDW  56.2  13.0 - 86.5 %    Platelets  192  150 - 400 K/uL    Neutrophils Relative %  78 (*)  43 - 77 %    Neutro Abs  8.3 (*)  1.7 - 7.7 K/uL    Lymphocytes Relative  13  12 - 46 %    Lymphs Abs  1.4  0.7 - 4.0 K/uL    Monocytes Relative  7  3 - 12 %    Monocytes Absolute  0.8  0.1 - 1.0 K/uL    Eosinophils Relative  1  0 - 5 %    Eosinophils Absolute  0.2  0.0 - 0.7 K/uL    Basophils Relative  0  0 - 1 %    Basophils Absolute  0.0  0.0 - 0.1 K/uL   BASIC METABOLIC PANEL Status: Abnormal    Collection Time    08/01/12 9:58 AM   Result  Value  Range    Sodium  139  135 - 145 mEq/L    Potassium  3.8  3.5 - 5.1 mEq/L    Chloride  103  96 - 112 mEq/L    CO2  25  19 - 32 mEq/L    Glucose, Bld  112 (*)  70 - 99 mg/dL    BUN  24 (*)  6 - 23 mg/dL    Creatinine, Ser  7.84  0.50 - 1.35 mg/dL    Calcium  9.1  8.4 - 10.5 mg/dL    GFR calc non Af Amer  77 (*)  >90 mL/min    GFR calc Af Amer  90 (*)  >90 mL/min    Comment:      The eGFR has been calculated     using the CKD EPI equation.     This calculation has not been     validated in all clinical     situations.     eGFR's persistently     <90 mL/min signify     possible Chronic Kidney Disease.   PRO B NATRIURETIC PEPTIDE Status: Abnormal    Collection Time    08/01/12 9:58 AM   Result  Value  Range    Pro B Natriuretic peptide (BNP)   491.1 (*)  0 - 450 pg/mL   TROPONIN I Status: None    Collection Time    08/01/12 9:58 AM   Result  Value  Range    Troponin I  <0.30  <0.30 ng/mL    Comment:      Due  to the release kinetics of cTnI,     a negative result within the first hours     of the onset of symptoms does not rule out     myocardial infarction with certainty.     If myocardial infarction is still suspected,     repeat the test at appropriate intervals.   URINALYSIS, ROUTINE W REFLEX MICROSCOPIC Status: None    Collection Time    08/01/12 11:27 AM   Result  Value  Range    Color, Urine  YELLOW  YELLOW    APPearance  CLEAR  CLEAR    Specific Gravity, Urine  1.016  1.005 - 1.030    pH  5.5  5.0 - 8.0    Glucose, UA  NEGATIVE  NEGATIVE mg/dL    Hgb urine dipstick  NEGATIVE  NEGATIVE    Bilirubin Urine  NEGATIVE  NEGATIVE    Ketones, ur  NEGATIVE  NEGATIVE mg/dL    Protein, ur  NEGATIVE  NEGATIVE mg/dL    Urobilinogen, UA  1.0  0.0 - 1.0 mg/dL    Nitrite  NEGATIVE  NEGATIVE    Leukocytes, UA  NEGATIVE  NEGATIVE    Comment:  MICROSCOPIC NOT DONE ON URINES WITH NEGATIVE PROTEIN, BLOOD, LEUKOCYTES, NITRITE, OR GLUCOSE <1000 mg/dL.   TROPONIN I Status: None    Collection Time    08/01/12 2:08 PM   Result  Value  Range    Troponin I  <0.30  <0.30 ng/mL    Comment:      Due to the release kinetics of cTnI,     a negative result within the first hours     of the onset of symptoms does not rule out     myocardial infarction with certainty.     If myocardial infarction is still suspected,     repeat the test at appropriate intervals.   TROPONIN I Status: None    Collection Time    08/01/12 8:10 PM   Result  Value  Range    Troponin I  <0.30  <0.30 ng/mL    Comment:      Due to the release kinetics of cTnI,     a negative result within the first hours     of the onset of symptoms does not rule out     myocardial infarction with certainty.     If myocardial infarction is still suspected,     repeat the  test at appropriate intervals.   CBC Status: Abnormal    Collection Time    08/01/12 8:10 PM   Result  Value  Range    WBC  9.0  4.0 - 10.5 K/uL    Comment:  WHITE COUNT CONFIRMED ON SMEAR    RBC  4.14 (*)  4.22 - 5.81 MIL/uL    Hemoglobin  11.4 (*)  13.0 - 17.0 g/dL    HCT  62.1 (*)  30.8 - 52.0 %    MCV  83.1  78.0 - 100.0 fL    MCH  27.5  26.0 - 34.0 pg    MCHC  33.1  30.0 - 36.0 g/dL    RDW  65.7  84.6 - 96.2 %    Platelets  203  150 - 400 K/uL   GLUCOSE, CAPILLARY Status: None    Collection Time    08/01/12 10:05 PM   Result  Value  Range    Glucose-Capillary  94  70 - 99 mg/dL   MRSA  PCR SCREENING Status: None    Collection Time    08/01/12 11:53 PM   Result  Value  Range    MRSA by PCR  NEGATIVE  NEGATIVE    Comment:      The GeneXpert MRSA Assay (FDA     approved for NASAL specimens     only), is one component of a     comprehensive MRSA colonization     surveillance program. It is not     intended to diagnose MRSA     infection nor to guide or     monitor treatment for     MRSA infections.   TROPONIN I Status: None    Collection Time    08/02/12 1:45 AM   Result  Value  Range    Troponin I  <0.30  <0.30 ng/mL    Comment:      Due to the release kinetics of cTnI,     a negative result within the first hours     of the onset of symptoms does not rule out     myocardial infarction with certainty.     If myocardial infarction is still suspected,     repeat the test at appropriate intervals.   BASIC METABOLIC PANEL Status: Abnormal    Collection Time    08/02/12 1:45 AM   Result  Value  Range    Sodium  138  135 - 145 mEq/L    Potassium  3.8  3.5 - 5.1 mEq/L    Chloride  104  96 - 112 mEq/L    CO2  25  19 - 32 mEq/L    Glucose, Bld  105 (*)  70 - 99 mg/dL    BUN  23  6 - 23 mg/dL    Creatinine, Ser  2.84  0.50 - 1.35 mg/dL    Calcium  8.7  8.4 - 10.5 mg/dL    GFR calc non Af Amer  82 (*)  >90 mL/min    GFR calc Af Amer  >90  >90 mL/min    Comment:       The eGFR has been calculated     using the CKD EPI equation.     This calculation has not been     validated in all clinical     situations.     eGFR's persistently     <90 mL/min signify     possible Chronic Kidney Disease.   CBC Status: Abnormal    Collection Time    08/02/12 1:51 AM   Result  Value  Range    WBC  9.8  4.0 - 10.5 K/uL    RBC  3.92 (*)  4.22 - 5.81 MIL/uL    Hemoglobin  10.5 (*)  13.0 - 17.0 g/dL    HCT  13.2 (*)  44.0 - 52.0 %    MCV  83.2  78.0 - 100.0 fL    MCH  26.8  26.0 - 34.0 pg    MCHC  32.2  30.0 - 36.0 g/dL    RDW  10.2  72.5 - 36.6 %    Platelets  159  150 - 400 K/uL   GLUCOSE, CAPILLARY Status: None    Collection Time    08/02/12 7:43 AM   Result  Value  Range    Glucose-Capillary  96  70 - 99 mg/dL    Dg Chest 2 View  4/40/3474 *RADIOLOGY REPORT* Clinical Data: Shortness of breath. Chest pain. CHEST -  2 VIEW Comparison: Radiographs and CT 07/06/2012. Findings: The heart size and mediastinal contours are stable status post CABG. The pleural effusions and patchy basilar opacities noted previously have resolved. The lungs are now clear. Old rib fractures are present on the left. IMPRESSION: Resolved pleural effusions and basilar air space opacities. No acute cardiopulmonary process. Original Report Authenticated By: Carey Bullocks, M.D.    Review of Systems  Constitutional: Positive for malaise/fatigue. Negative for fever, chills, weight loss and diaphoresis.  HENT: Negative.  Eyes: Negative.  Respiratory: Negative for cough, sputum production and shortness of breath.  Cardiovascular: Positive for leg swelling. Negative for chest pain, palpitations, orthopnea, claudication and PND.  Gastrointestinal: Negative.  Genitourinary: Negative.  Musculoskeletal: Negative.  Skin: Negative.  Neurological: Positive for dizziness. Negative for weakness.  Endo/Heme/Allergies: Negative.  Psychiatric/Behavioral: Negative.  Blood pressure 106/48, pulse 62,  temperature 98.3 F (36.8 C), temperature source Oral, resp. rate 16, height 5\' 9"  (1.753 m), weight 99.8 kg (220 lb 0.3 oz), SpO2 98.00%.    Physical Exam  Constitutional: He is oriented to person, place, and time. He appears well-developed and well-nourished. No distress.  HENT:  Head: Normocephalic and atraumatic.  Mouth/Throat: Oropharynx is clear and moist.  Eyes: Conjunctivae and EOM are normal. Pupils are equal, round, and reactive to light.  Neck: Normal range of motion. Neck supple. No JVD present. No thyromegaly present.  Cardiovascular: Normal rate, regular rhythm, normal heart sounds and intact distal pulses. Exam reveals no gallop and no friction rub.  No murmur heard.  Respiratory: Effort normal and breath sounds normal. No respiratory distress. He has no wheezes. He has no rales.  Old sternotomy scar.  GI: Soft. Bowel sounds are normal. He exhibits no distension and no mass. There is no tenderness.  Musculoskeletal: Normal range of motion. He exhibits no edema and no tenderness.  Lymphadenopathy:  He has no cervical adenopathy.  Neurological: He is alert and oriented to person, place, and time. He has normal strength. No cranial nerve deficit or sensory deficit.  Skin: Skin is warm and dry.  Psychiatric: He has a normal mood and affect.    [facility] [address] [place], [place] [address] [phone]  ------------------------------------------------------------ Transthoracic Echocardiography  Patient: Khizar, Fiorella MR #: [id number] Study Date: 07/12/2012 Gender: M Age: 50 Height: 175.3cm Weight: 101.6kg BSA: 2.13m^2 Pt. Status: Room:  Lajean Saver, Christeen Douglas, Arlys John ATTENDING Dietrich Pates PERFORMING [facility] SONOGRAPHER Junious Dresser, RDCS cc:  ------------------------------------------------------------ LV EF: 35% - 40%  ------------------------------------------------------------ Indications: Edema  782.3. Shortness of breath 786.05.  ------------------------------------------------------------ History: PMH: Emphysema. Right pleural effusion. Ascites. Acquired from the patient and from the patient's chart. Dyspnea and bilateral lower extremity edema. Coronary artery disease. Chronic obstructive pulmonary disease. PMH: Myocardial infarction. Risk factors: Lifelong nonsmoker. Hypertension. Obese. Dyslipidemia.  ------------------------------------------------------------ Study Conclusions  - Left ventricle: The cavity size was mildly dilated. Wall thickness was increased in a pattern of mild LVH. Systolic function was moderately reduced. The estimated ejection fraction was in the range of 35% to 40%. - Right ventricle: Systolic function was mildly reduced. - Pulmonary arteries: PA peak pressure: 51mm Hg (S).  ------------------------------------------------------------ Labs, prior tests, procedures, and surgery: Coronary artery bypass grafting.  Transthoracic echocardiography. M-mode, complete 2D, spectral Doppler, and color Doppler. Height: Height: 175.3cm. Height: 69in. Weight: Weight: 101.6kg. Weight: 223.5lb. Body mass index: BMI: 33.1kg/m^2. Body surface area: BSA: 2.66m^2. Blood pressure: 118/70. Patient status: Outpatient. Location: [facility]  ------------------------------------------------------------  ------------------------------------------------------------  Left ventricle: The cavity size was mildly dilated. Wall thickness was increased in a pattern of mild LVH. Systolic function was moderately reduced. The estimated ejection fraction was in the range of 35% to 40%.  ------------------------------------------------------------ Aortic valve: Mildly thickened leaflets. Doppler: No significant regurgitation.  ------------------------------------------------------------ Mitral valve: Mildly thickened leaflets . Doppler: Trivial regurgitation. Peak  gradient: 3mm Hg (D).  ------------------------------------------------------------ Left atrium: The atrium was normal in size.  ------------------------------------------------------------ Right ventricle: The cavity size was mildly dilated. Systolic function was mildly reduced.  ------------------------------------------------------------ Pulmonic valve: Structurally normal valve. Cusp separation was normal. Doppler: Transvalvular velocity was within the normal range. No significant regurgitation.  ------------------------------------------------------------ Tricuspid valve: Structurally normal valve. Leaflet separation was normal. Doppler: Transvalvular velocity was within the normal range. Mild regurgitation.  ------------------------------------------------------------ Right atrium: The atrium was normal in size.  ------------------------------------------------------------ Pericardium: There was no pericardial effusion.  ------------------------------------------------------------  2D measurements Normal Doppler measurements Normal Left ventricle Main pulmonary LVID ED, 53.9 mm 43-52 artery chord, Pressure, 51 mm Hg =30 PLAX S LVID ES, 48.5 mm 23-38 Left ventricle chord, Ea, lat 8.99 cm/s ------ PLAX ann, tiss FS, chord, 10 % >29 DP PLAX E/Ea, lat 10.38 ------ LVPW, ED 13.3 mm ------ ann, tiss IVS/LVPW 0.95 <1.3 DP ratio, ED Ea, med 8.22 cm/s ------ Vol ED, 151 ml ------ ann, tiss MOD1 DP Vol ES, 93 ml ------ E/Ea, med 11.35 ------ MOD1 ann, tiss EF, MOD1 38 % ------ DP Vol index, 70 ml/m^2 ------ LVOT ED, MOD1 Peak vel, 103 cm/s ------ Vol index, 43 ml/m^2 ------ S ES, MOD1 VTI, S 22.1 cm ------ Vol ED, 151 ml ------ Stroke vol 100 ml ------ MOD2 Stroke 46.1 ml/m^ ------ Vol ES, 96 ml ------ index 2 MOD2 Mitral valve EF, MOD2 36 % ------ Peak E vel 93.3 cm/s ------ Stroke 55 ml ------ Peak A vel 79 cm/s ------ vol, MOD2 Decelerati 208 ms 150-23 Vol  index, 70 ml/m^2 ------ on time 0 ED, MOD2 Peak 3 mm Hg ------ Vol index, 44 ml/m^2 ------ gradient, ES, MOD2 D Stroke 25.3 ml/m^2 ------ Peak E/A 1.2 ------ index, ratio MOD2 Tricuspid valve Ventricular septum Regurg 319 cm/s ------ IVS, ED 12.6 mm ------ peak vel LVOT Peak RV-RA 41 mm Hg ------ Diam, S 24 mm ------ gradient, Area 4.52 cm^2 ------ S Diam 24 mm ------ Systemic veins Aorta Estimated 10 mm Hg ------ Root diam, 39 mm ------ CVP ED Right ventricle AAo AP 40 mm ------ Pressure, 51 mm Hg <30 diam, S S Left atrium Sa vel, 9.32 cm/s ------ AP dim 37 mm ------ lat ann, AP dim 1.71 cm/m^2 <2.2 tiss DP index  ------------------------------------------------------------ Prepared and Electronically Authenticated by  Dietrich Pates [id number]:[id number]     CARDIAC CATH:  Procedural Findings:  Hemodynamics  RA 8/6 with a mean of 4 mmHg  RV 44/9 mmHg  PA 39/12 with a mean of 24 mmHg  PCWP 20 5/25 with a mean of 20 mmHg  LV 160/16 mm per  AO 160/68 with a mean of 103 mmHg  Oxygen saturations:  PA 67%  AO 95%  Cardiac Output (Fick) 4.3 L per minute  Cardiac Index (Fick) 2 L per minute per meter square  Coronary angiography:  Coronary dominance: right  Left mainstem: The left main coronary is calcified. There is a 90% stenosis in the distal left main coronary.  Left anterior descending (LAD): Is a 95% stenosis in the proximal LAD. It is occluded after a small first diagonal branch.  There is a  moderate size ramus intermediate branch which is normal.  Left circumflex (LCx): The left circumflex gives rise to a first marginal branch that is occluded. The circumflex then terminates after second marginal branch. It gives rise to collaterals to the right coronary. There are left to left collaterals to the first obtuse marginal vessel.  Right coronary artery (RCA): The right coronary is occluded proximally. There are mild right to right bridging collaterals. There are good  left to right collaterals to the distal RCA.  The LIMA graft to the LAD was very difficult to engage. However flush shots demonstrated that the graft was large and patent  Left ventriculography: Left ventricular systolic function is abnormal. Ejection fraction is estimated at 35-40%, the inferior wall is severely hypokinetic. there is no significant mitral regurgitation  Final Conclusions:  1. Severe left main and three-vessel obstructive coronary artery disease.  2. Patent LIMA graft to the LAD  3. Moderate left ventricular dysfunction.  Recommendations: Given his LV dysfunction and significant areas of the myocardium that have not been revascularized would consider redo CABG.  Theron Arista [facility]  07/30/2012, 10:19 AM    Assessment/Plan :  He has high grade left main and severe multivessel coronary disease status post coronary bypass graft surgery x1 in 1994. He has moderate left ventricular dysfunction presenting with 2 years of progressive congestive heart failure symptoms. I agree that redo coronary bypass graft surgery is indicated to prevent further ischemia and infarction, improve his congestive heart failure symptoms, and improve his quality of life. He was admitted yesterday with a left groin pseudoaneurysm which has been treated with compression. He has had no chest pain or dyspnea and I would like to let this left groin pseudoaneurysm and hematoma stabilize for a week or so prior to proceeding with surgery if possible. I will tentatively schedule him for surgery on Thursday, July 10 and will have my office call him at home to arrange this. I discussed the operative procedure with the patient and wife including alternatives, benefits and risks; including but not limited to bleeding, blood transfusion, infection, stroke, myocardial infarction, graft failure, heart block requiring a permanent pacemaker, organ dysfunction, and death. Wardell Honour understands and agrees to proceed.    Alleen Borne  08/02/2012, 12:22 PM

## 2012-08-14 NOTE — Progress Notes (Signed)
  Echocardiogram Echocardiogram Transesophageal has been performed.  Atlee Kluth, [facility] 08/14/2012, 9:28 AM

## 2012-08-14 NOTE — Brief Op Note (Addendum)
08/14/2012  11:22 AM  PATIENT:  Joseph Berg  79 y.o. male  PRE-OPERATIVE DIAGNOSIS:  CAD  POST-OPERATIVE DIAGNOSIS:  Coronary Artery Disease  PROCEDURE:  Procedure(s):  REDO CORONARY ARTERY BYPASS GRAFTING x 2 -SVG to RAMUS INTERMEDIATE -SVG to PDA  ENDOSCOPIC SAPHENOUS VEIN HARVEST RIGHT LEG  INTRAOPERATIVE TRANSESOPHAGEAL ECHOCARDIOGRAM (N/A)  SURGEON:  Surgeon(s) and Role:    * Alleen Borne, MD - Primary  PHYSICIAN ASSISTANT: Erin Barrett PA-C  ANESTHESIA:   general  EBL:  Total I/O In: 1000 [I.V.:1000] Out: 150 [Urine:150]  BLOOD ADMINISTERED: CELLSAVER  DRAINS: Mediastinal chest drains   LOCAL MEDICATIONS USED:  NONE  SPECIMEN:  No Specimen  DISPOSITION OF SPECIMEN:  N/A  COUNTS:  YES  TOURNIQUET:  * No tourniquets in log *  DICTATION: .Dragon Dictation  PLAN OF CARE: Admit to inpatient   PATIENT DISPOSITION:  ICU - intubated and hemodynamically stable.   Delay start of Pharmacological VTE agent (>24hrs) due to surgical blood loss or risk of bleeding: yes

## 2012-08-14 NOTE — Procedures (Addendum)
  Echocardiogram Intraoperative transesophageal echocardiogram (pre-bypass) has been performed.  Probe placed atraumatically.   Left ventricle: Moderately depressed LV systolic function (EF ~40%). Mild global, but pronounced inferior wall hypokinesis.   Right ventricle: Mild RV dilation. Mildly depressed RV systolic function.  Left atrium: Grossly normal. No dilation  Right atrium: PA catheter noted. Mild RA dilation.  Atrial septum: No obvious septal defect.  Aortic valve: Mildly sclerotic aortic valve. Leaflets freely mobile with good coaptation. No regurgitation or stenosis.  Mitral valve: Mild central mitral regurgitation. Valve leaflets freely mobile. No prolapse.  Tricuspid valve/PA: trace tricuspid regurgitation, likely due to PA catheter. No pulmonic incompetence noted.   Post-bypass Improved contractility inferiorly. EF ~50%. RVSF also improved.    Lewie Loron 08/14/2012, 10:06 AM

## 2012-08-14 NOTE — Procedures (Signed)
Extubation Procedure Note  Patient Details:   Name: Joseph Berg DOB: 12-18-1933 MRN: [id number]   Airway Documentation:     Evaluation  O2 sats: stable throughout Complications: No apparent complications Patient did tolerate procedure well. Bilateral Breath Sounds: Diminished;Clear   Yes  Lillar Bianca 08/14/2012, 7:42 PM VC=1.6 NIF=-22 IS=490

## 2012-08-14 NOTE — Preoperative (Signed)
Beta Blockers   Reason not to administer Beta Blockers:Not Applicable 

## 2012-08-15 ENCOUNTER — Inpatient Hospital Stay (HOSPITAL_COMMUNITY): Payer: Medicare PPO

## 2012-08-15 LAB — TYPE AND SCREEN
ABO/RH(D): O POS
Antibody Screen: NEGATIVE
Unit division: 0

## 2012-08-15 LAB — CREATININE, SERUM
Creatinine, Ser: 0.97 mg/dL (ref 0.50–1.35)
GFR calc Af Amer: 90 mL/min — ABNORMAL LOW (ref >90)
GFR calc non Af Amer: 78 mL/min — ABNORMAL LOW (ref >90)

## 2012-08-15 LAB — HEMOGLOBIN AND HEMATOCRIT, BLOOD
HCT: 20.4 % — ABNORMAL LOW (ref 39.0–52.0)
Hemoglobin: 6.9 g/dL — CL (ref 13.0–17.0)

## 2012-08-15 LAB — CBC
Hemoglobin: 8.2 g/dL — ABNORMAL LOW (ref 13.0–17.0)
Platelets: 163 10*3/uL (ref 150–400)
Platelets: 153 10*3/uL (ref 150–400)
RBC: 3.12 MIL/uL — ABNORMAL LOW (ref 4.22–5.81)
RBC: 2.9 MIL/uL — ABNORMAL LOW (ref 4.22–5.81)
RDW: 18.5 % — ABNORMAL HIGH (ref 11.5–15.5)
WBC: 10.7 10*3/uL — ABNORMAL HIGH (ref 4.0–10.5)
WBC: 10.2 10*3/uL (ref 4.0–10.5)

## 2012-08-15 LAB — BASIC METABOLIC PANEL
CO2: 25 mEq/L (ref 19–32)
Calcium: 8.1 mg/dL — ABNORMAL LOW (ref 8.4–10.5)
Chloride: 106 mEq/L (ref 96–112)
GFR calc Af Amer: >90 mL/min (ref >90)
Sodium: 138 mEq/L (ref 135–145)

## 2012-08-15 LAB — POCT I-STAT, CHEM 8
Calcium, Ion: 1.26 mmol/L (ref 1.13–1.30)
Glucose, Bld: 113 mg/dL — ABNORMAL HIGH (ref 70–99)
HCT: 23 % — ABNORMAL LOW (ref 39.0–52.0)
Hemoglobin: 7.8 g/dL — ABNORMAL LOW (ref 13.0–17.0)
TCO2: 25 mmol/L (ref 0–100)

## 2012-08-15 LAB — GLUCOSE, CAPILLARY
Glucose-Capillary: 102 mg/dL — ABNORMAL HIGH (ref 70–99)
Glucose-Capillary: 103 mg/dL — ABNORMAL HIGH (ref 70–99)
Glucose-Capillary: 103 mg/dL — ABNORMAL HIGH (ref 70–99)
Glucose-Capillary: 109 mg/dL — ABNORMAL HIGH (ref 70–99)
Glucose-Capillary: 111 mg/dL — ABNORMAL HIGH (ref 70–99)
Glucose-Capillary: 119 mg/dL — ABNORMAL HIGH (ref 70–99)
Glucose-Capillary: 94 mg/dL (ref 70–99)
Glucose-Capillary: 96 mg/dL (ref 70–99)
Glucose-Capillary: 98 mg/dL (ref 70–99)

## 2012-08-15 LAB — MAGNESIUM
Magnesium: 2.4 mg/dL (ref 1.5–2.5)
Magnesium: 2.3 mg/dL (ref 1.5–2.5)

## 2012-08-15 LAB — POCT I-STAT GLUCOSE: Glucose, Bld: 161 mg/dL — ABNORMAL HIGH (ref 70–99)

## 2012-08-15 MED ORDER — INSULIN ASPART 100 UNIT/ML ~~LOC~~ SOLN: 0.0000 [IU] | SUBCUTANEOUS | Status: AC

## 2012-08-15 MED ORDER — INSULIN ASPART 100 UNIT/ML ~~LOC~~ SOLN
0.0000 [IU] | SUBCUTANEOUS | Status: DC
  Administered 2012-08-15 (×2): 2 [IU] via SUBCUTANEOUS

## 2012-08-15 MED ORDER — POTASSIUM CHLORIDE CRYS ER 20 MEQ PO TBCR
40.0000 meq | EXTENDED_RELEASE_TABLET | Freq: Once | ORAL | Status: AC
  Administered 2012-08-15: 40 meq via ORAL
  Filled 2012-08-15: qty 2
  Filled 2012-08-15: qty 1

## 2012-08-15 MED ORDER — ENOXAPARIN SODIUM 40 MG/0.4ML ~~LOC~~ SOLN
40.0000 mg | Freq: Every day | SUBCUTANEOUS | Status: DC
  Administered 2012-08-15 – 2012-08-17 (×3): 40 mg via SUBCUTANEOUS
  Filled 2012-08-15 (×5): qty 0.4

## 2012-08-15 MED ORDER — INSULIN ASPART 100 UNIT/ML ~~LOC~~ SOLN: 0.0000 [IU] | SUBCUTANEOUS | Status: DC

## 2012-08-15 MED ORDER — FUROSEMIDE 10 MG/ML IJ SOLN
40.0000 mg | Freq: Two times a day (BID) | INTRAMUSCULAR | Status: AC
  Administered 2012-08-15 (×2): 40 mg via INTRAVENOUS
  Filled 2012-08-15 (×2): qty 4

## 2012-08-15 MED FILL — Magnesium Sulfate Inj 50%: INTRAMUSCULAR | Qty: 10 | Status: AC

## 2012-08-15 MED FILL — Albumin, Human Inj 5%: INTRAVENOUS | Qty: 250 | Status: AC

## 2012-08-15 MED FILL — Heparin Sodium (Porcine) Inj 1000 Unit/ML: INTRAMUSCULAR | Qty: 10 | Status: AC

## 2012-08-15 MED FILL — Potassium Chloride Inj 2 mEq/ML: INTRAVENOUS | Qty: 40 | Status: AC

## 2012-08-15 MED FILL — Sodium Chloride Irrigation Soln 0.9%: Qty: 3000 | Status: AC

## 2012-08-15 MED FILL — Electrolyte-R (PH 7.4) Solution: INTRAVENOUS | Qty: 5000 | Status: AC

## 2012-08-15 MED FILL — Lidocaine HCl IV Inj 20 MG/ML: INTRAVENOUS | Qty: 5 | Status: AC

## 2012-08-15 MED FILL — Heparin Sodium (Porcine) Inj 1000 Unit/ML: INTRAMUSCULAR | Qty: 30 | Status: AC

## 2012-08-15 MED FILL — Sodium Bicarbonate IV Soln 8.4%: INTRAVENOUS | Qty: 50 | Status: AC

## 2012-08-15 MED FILL — Mannitol IV Soln 20%: INTRAVENOUS | Qty: 500 | Status: AC

## 2012-08-15 MED FILL — Sodium Chloride IV Soln 0.9%: INTRAVENOUS | Qty: 1000 | Status: AC

## 2012-08-15 NOTE — Progress Notes (Signed)
1 Day Post-Op Procedure(s) (LRB): REDO CORONARY ARTERY BYPASS GRAFTING (CABG) (N/A) INTRAOPERATIVE TRANSESOPHAGEAL ECHOCARDIOGRAM (N/A) Subjective: No complaints  Objective: Vital signs in last 24 hours: Temp:  [97.2 F (36.2 C)-98.8 F (37.1 C)] 98.8 F (37.1 C) (07/09 0700) Pulse Rate:  [65-107] 90 (07/09 0700) Cardiac Rhythm:  [-] Normal sinus rhythm (07/08 2000) Resp:  [12-28] 23 (07/09 0700) BP: (78-124)/(44-65) 124/62 mmHg (07/09 0700) SpO2:  [97 %-100 %] 99 % (07/09 0700) Arterial Line BP: (89-158)/(44-73) 146/54 mmHg (07/09 0700) FiO2 (%):  [40 %-50 %] 40 % (07/08 1843) Weight:  [103.5 kg (228 lb 2.8 oz)] 103.5 kg (228 lb 2.8 oz) (07/09 0500)  Hemodynamic parameters for last 24 hours: PAP: (26-44)/(14-26) 37/20 mmHg CO:  [3 L/min-6.7 L/min] 6.7 L/min CI:  [1.4 L/min/m2-4.8 L/min/m2] 3.1 L/min/m2  Intake/Output from previous day: 07/08 0701 - 07/09 0700 In: 7098.6 [P.O.:610; I.V.:4107.6; Blood:1051; NG/GT:30; IV Piggyback:1300] Out: 4990 [Urine:3350; Blood:1250; Chest Tube:390] Intake/Output this shift:    General appearance: alert and cooperative Neurologic: intact Heart: regular rate and rhythm, S1, S2 normal, no murmur, click, rub or gallop Lungs: clear to auscultation bilaterally Extremities: edema mild Wound: dressing dry  Lab Results:  Recent Labs  08/14/12 1900 08/14/12 1930 08/15/12 0408  WBC 14.3*  --  10.7*  HGB 9.3* 9.5* 8.7*  HCT 28.2* 28.0* 26.2*  PLT 177  --  163   BMET:  Recent Labs  08/14/12 1930 08/15/12 0408  NA 139 138  K 4.4 4.0  CL 106 106  CO2  --  25  GLUCOSE 143* 109*  BUN 23 20  CREATININE 1.00 0.81  CALCIUM  --  8.1*    PT/INR:  Recent Labs  08/14/12 1300  LABPROT 18.5*  INR 1.59*   ABG    Component Value Date/Time   PHART 7.296* 08/14/2012 2028   HCO3 23.6 08/14/2012 2028   TCO2 25 08/14/2012 2028   ACIDBASEDEF 3.0* 08/14/2012 2028   O2SAT 95.0 08/14/2012 2028   CBG (last 3)   Recent Labs  08/15/12 0209  08/15/12 0408 08/15/12 0628  GLUCAP 103* 103* 109*   CXR: clear  ECG: NSR, RBBB, Old inferior infarct. No significant change from preop.  Assessment/Plan: S/P Procedure(s) (LRB): REDO CORONARY ARTERY BYPASS GRAFTING (CABG) (N/A) INTRAOPERATIVE TRANSESOPHAGEAL ECHOCARDIOGRAM (N/A) Hemodynamically stable: wean dopamine off Mobilize Diuresis Diabetes control: preop Hgb A1C 5.8 d/c tubes/lines See progression orders   LOS: 1 day    Milyn Stapleton K 08/15/2012

## 2012-08-15 NOTE — Anesthesia Postprocedure Evaluation (Signed)
  Anesthesia Post-op Note  Patient: Joseph Berg  Procedure(s) Performed: Procedure(s): REDO CORONARY ARTERY BYPASS GRAFTING (CABG) (N/A) INTRAOPERATIVE TRANSESOPHAGEAL ECHOCARDIOGRAM (N/A)  Patient Location: SICU  Anesthesia Type:General  Level of Consciousness: awake, alert  and oriented  Airway and Oxygen Therapy: Patient Spontanous Breathing and Patient connected to nasal cannula oxygen  Post-op Pain: none  Post-op Assessment: Post-op Vital signs reviewed, Patient's Cardiovascular Status Stable, Respiratory Function Stable, Patent Airway, No signs of Nausea or vomiting, Adequate PO intake and Pain level controlled  Post-op Vital Signs: Reviewed and stable  Complications: No apparent anesthesia complications

## 2012-08-15 NOTE — Progress Notes (Signed)
Patient ID: Joseph Berg, male   DOB: 1934-01-19, 79 y.o.   MRN: [id number]  SICU Evening Rounds:  Hemodynamically stable.  Urine output good BMET    Component Value Date/Time   NA 139 08/15/2012 1714   K 4.2 08/15/2012 1714   CL 103 08/15/2012 1714   CO2 25 08/15/2012 0408   GLUCOSE 113* 08/15/2012 1714   BUN 23 08/15/2012 1714   CREATININE 1.00 08/15/2012 1714   CREATININE 1.04 07/26/2012 1028   CALCIUM 8.1* 08/15/2012 0408   CALCIUM 10.8* 07/22/2010 0907   GFRNONAA 78* 08/15/2012 1700   GFRAA 90* 08/15/2012 1700     CBC    Component Value Date/Time   WBC 10.2 08/15/2012 1700   RBC 2.90* 08/15/2012 1700   HGB 7.8* 08/15/2012 1714   HCT 23.0* 08/15/2012 1714   PLT 153 08/15/2012 1700   MCV 85.2 08/15/2012 1700   MCH 28.3 08/15/2012 1700   MCHC 33.2 08/15/2012 1700   RDW 18.7* 08/15/2012 1700   LYMPHSABS 1.4 08/01/2012 0958   MONOABS 0.8 08/01/2012 0958   EOSABS 0.2 08/01/2012 0958   BASOSABS 0.0 08/01/2012 0958    Follow up Hgb in am. Start iron tomorrow.

## 2012-08-15 NOTE — Clinical Documentation Improvement (Signed)
THIS DOCUMENT IS NOT A PERMANENT PART OF THE MEDICAL RECORD  Please update your documentation with the medical record to reflect your response to this query. If you need help knowing how to do this please call [phone].  08/15/12  Dear Dr. Laneta Simmers [place]  In an effort to better capture your patient's severity of illness, reflect appropriate length of stay and utilization of resources, a review of the patient medical record has revealed the following indicators.    Based on your clinical judgment, please clarify and document in a progress note and/or discharge summary the clinical condition associated with the following supporting information:  In responding to this query please exercise your independent judgment.  The fact that a query is asked, does not imply that any particular answer is desired or expected.    Possible Clinical Conditions?   " Expected Acute Blood Loss Anemia  " Acute Blood Loss Anemia  " Acute on chronic blood loss anemia  " Precipitous drop in Hematocrit  " Other Condition  " Cannot Clinically Determine     Risk Factors:  EBL per 7/08 Anesthesia record: .   Diagnostics: H&H on 7/03: 11.3/34/3 H&H on 7/08:  6.9/20.4  Transfusion: PRBC: 700cc per 7/08 Anesthesia record.  IV fluids / plasma expanders: Per 7/08 Anesthesia record: Cell saver:  351cc 0.9% NaCl: 200cc LR: total of 2700cc   Reviewed:  No additional documentation provided.                                            Thank You,  Marciano Sequin,  Clinical Documentation Specialist: [phone] Health Information Management [place]

## 2012-08-15 NOTE — Progress Notes (Signed)
Utilization review completed.  P.J. Deztinee Lohmeyer,RN,BSN Case Manager [phone]  

## 2012-08-15 NOTE — Transfer of Care (Signed)
Immediate Anesthesia Transfer of Care Note  Patient: Joseph Berg  Procedure(s) Performed: Procedure(s): REDO CORONARY ARTERY BYPASS GRAFTING (CABG) (N/A) INTRAOPERATIVE TRANSESOPHAGEAL ECHOCARDIOGRAM (N/A)  Patient Location: PACU and SICU  Anesthesia Type:General  Level of Consciousness: unresponsive  Airway & Oxygen Therapy: Patient remains intubated per anesthesia plan and Patient placed on Ventilator (see vital sign flow sheet for setting)  Post-op Assessment: Report given to PACU RN and Post -op Vital signs reviewed and stable  Post vital signs: Reviewed and stable  Complications: No apparent anesthesia complications

## 2012-08-16 ENCOUNTER — Inpatient Hospital Stay (HOSPITAL_COMMUNITY): Payer: Medicare PPO

## 2012-08-16 ENCOUNTER — Encounter (HOSPITAL_COMMUNITY): Payer: Self-pay | Admitting: Surgery

## 2012-08-16 LAB — GLUCOSE, CAPILLARY
Glucose-Capillary: 86 mg/dL (ref 70–99)
Glucose-Capillary: 108 mg/dL — ABNORMAL HIGH (ref 70–99)

## 2012-08-16 LAB — CBC
HCT: 23.1 % — ABNORMAL LOW (ref 39.0–52.0)
MCHC: 32.9 g/dL (ref 30.0–36.0)
MCV: 85.9 fL (ref 78.0–100.0)
RDW: 18.7 % — ABNORMAL HIGH (ref 11.5–15.5)

## 2012-08-16 LAB — BASIC METABOLIC PANEL
BUN: 25 mg/dL — ABNORMAL HIGH (ref 6–23)
CO2: 30 mEq/L (ref 19–32)
Chloride: 105 mEq/L (ref 96–112)
Creatinine, Ser: 1.1 mg/dL (ref 0.50–1.35)

## 2012-08-16 MED ORDER — FUROSEMIDE 40 MG PO TABS
40.0000 mg | ORAL_TABLET | Freq: Every day | ORAL | Status: AC
  Administered 2012-08-17: 40 mg via ORAL
  Filled 2012-08-16 (×2): qty 1

## 2012-08-16 MED ORDER — ACETAMINOPHEN 325 MG PO TABS
650.0000 mg | ORAL_TABLET | Freq: Four times a day (QID) | ORAL | Status: DC | PRN
  Administered 2012-08-17 (×2): 650 mg via ORAL
  Filled 2012-08-16 (×2): qty 2

## 2012-08-16 MED ORDER — ONDANSETRON HCL 4 MG PO TABS: 4.0000 mg | ORAL_TABLET | Freq: Four times a day (QID) | ORAL | Status: DC | PRN

## 2012-08-16 MED ORDER — SODIUM CHLORIDE 0.9 % IV SOLN: 250.0000 mL | INTRAVENOUS | Status: DC | PRN

## 2012-08-16 MED ORDER — METOPROLOL TARTRATE 12.5 MG HALF TABLET
12.5000 mg | ORAL_TABLET | Freq: Two times a day (BID) | ORAL | Status: DC
  Administered 2012-08-16: 12.5 mg via ORAL
  Filled 2012-08-16 (×4): qty 1

## 2012-08-16 MED ORDER — DOCUSATE SODIUM 100 MG PO CAPS
200.0000 mg | ORAL_CAPSULE | Freq: Every day | ORAL | Status: DC
  Administered 2012-08-17 – 2012-08-18 (×2): 200 mg via ORAL
  Filled 2012-08-16 (×2): qty 2

## 2012-08-16 MED ORDER — SODIUM CHLORIDE 0.9 % IJ SOLN
3.0000 mL | Freq: Two times a day (BID) | INTRAMUSCULAR | Status: DC
  Administered 2012-08-16 – 2012-08-17 (×2): 3 mL via INTRAVENOUS

## 2012-08-16 MED ORDER — ASPIRIN EC 325 MG PO TBEC
325.0000 mg | DELAYED_RELEASE_TABLET | Freq: Every day | ORAL | Status: DC
  Administered 2012-08-17 – 2012-08-18 (×2): 325 mg via ORAL
  Filled 2012-08-16 (×3): qty 1

## 2012-08-16 MED ORDER — MOVING RIGHT ALONG BOOK
Freq: Once | Status: AC
  Administered 2012-08-16: 12:00:00
  Filled 2012-08-16: qty 1

## 2012-08-16 MED ORDER — TRAMADOL HCL 50 MG PO TABS: 50.0000 mg | ORAL_TABLET | ORAL | Status: DC | PRN

## 2012-08-16 MED ORDER — BISACODYL 5 MG PO TBEC: 10.0000 mg | DELAYED_RELEASE_TABLET | Freq: Every day | ORAL | Status: DC | PRN

## 2012-08-16 MED ORDER — OXYCODONE HCL 5 MG PO TABS
5.0000 mg | ORAL_TABLET | ORAL | Status: DC | PRN
Start: 2012-08-16 — End: 2012-08-18
  Administered 2012-08-16: 5 mg via ORAL
  Administered 2012-08-17: 10 mg via ORAL
  Filled 2012-08-16 (×2): qty 2

## 2012-08-16 MED ORDER — FAMOTIDINE 20 MG PO TABS
20.0000 mg | ORAL_TABLET | Freq: Two times a day (BID) | ORAL | Status: DC
  Administered 2012-08-16 – 2012-08-18 (×4): 20 mg via ORAL
  Filled 2012-08-16 (×6): qty 1

## 2012-08-16 MED ORDER — SODIUM CHLORIDE 0.9 % IJ SOLN: 3.0000 mL | INTRAMUSCULAR | Status: DC | PRN

## 2012-08-16 MED ORDER — BISACODYL 10 MG RE SUPP: 10.0000 mg | Freq: Every day | RECTAL | Status: DC | PRN

## 2012-08-16 MED ORDER — POTASSIUM CHLORIDE CRYS ER 20 MEQ PO TBCR
20.0000 meq | EXTENDED_RELEASE_TABLET | Freq: Two times a day (BID) | ORAL | Status: AC
  Administered 2012-08-16 – 2012-08-17 (×4): 20 meq via ORAL
  Filled 2012-08-16 (×2): qty 1

## 2012-08-16 MED ORDER — ONDANSETRON HCL 4 MG/2ML IJ SOLN: 4.0000 mg | Freq: Four times a day (QID) | INTRAMUSCULAR | Status: DC | PRN

## 2012-08-16 NOTE — Progress Notes (Signed)
Patient transferred via wheelchair and on monitor to room 2017, escorted by unit charge RN and spouse. Safety maintained. All belongings sent with patient to room 2017.

## 2012-08-16 NOTE — Progress Notes (Signed)
Foley cath removed at 1000 per CNA.  Right IJ central line removed per RN as ordered per Dr. Laneta Simmers.  Tolerated well. VSS. No acute distress noted.

## 2012-08-16 NOTE — Progress Notes (Signed)
CARDIAC REHAB PHASE I   PRE:  Rate/Rhythm: 72 SR    BP: sitting 109/57    SaO2: 98 6L, 100 3L, 97 1L, 90 RA  MODE:  Ambulation: 350 ft   POST:  Rate/Rhythm: 88 SR    BP: sitting 115/44     SaO2: 94 1L  Pt stiff and sore getting to EOB and standing. Sts right upper leg hurts, like a "phantom pain". Had ben on 6L in bed. Decreased O2 to RA, then 1L for walk (borderline SaO2 on RA). Stiff walking but overall did fairly well. Small steps. Used RW. To recliner after walk. Left on 1L and notified RN. Will f/u in am. 1400-1433   Elissa Lovett [place] CES, ACSM 08/16/2012 3:03 PM

## 2012-08-16 NOTE — Progress Notes (Signed)
2 Days Post-Op Procedure(s) (LRB): REDO CORONARY ARTERY BYPASS GRAFTING (CABG) (N/A) INTRAOPERATIVE TRANSESOPHAGEAL ECHOCARDIOGRAM (N/A) Subjective: No complaints  Objective: Vital signs in last 24 hours: Temp:  [98.3 F (36.8 C)-99 F (37.2 C)] 98.3 F (36.8 C) (07/10 0740) Pulse Rate:  [70-93] 79 (07/10 0700) Cardiac Rhythm:  [-] Normal sinus rhythm (07/10 0600) Resp:  [17-29] 24 (07/10 0700) BP: (63-130)/(46-75) 115/62 mmHg (07/10 0700) SpO2:  [95 %-100 %] 95 % (07/10 0700) Arterial Line BP: (117-171)/(47-74) 166/67 mmHg (07/09 1130) Weight:  [102.1 kg (225 lb 1.4 oz)] 102.1 kg (225 lb 1.4 oz) (07/10 0500)  Hemodynamic parameters for last 24 hours: PAP: (33-55)/(17-36) 45/26 mmHg CO:  [5.7 L/min] 5.7 L/min CI:  [2.6 L/min/m2] 2.6 L/min/m2  Intake/Output from previous day: 07/09 0701 - 07/10 0700 In: 1025.7 [P.O.:400; I.V.:575.7; IV Piggyback:50] Out: 5284 [XLKGM:0102; Chest Tube:20] Intake/Output this shift: Total I/O In: 50 [IV Piggyback:50] Out: -   General appearance: alert and cooperative Neurologic: intact Heart: regular rate and rhythm, S1, S2 normal, no murmur, click, rub or gallop Lungs: clear to auscultation bilaterally Extremities: edema mild Wound: incision ok  Lab Results:  Recent Labs  08/15/12 1700 08/15/12 1714 08/16/12 0410  WBC 10.2  --  9.2  HGB 8.2* 7.8* 7.6*  HCT 24.7* 23.0* 23.1*  PLT 153  --  144*   BMET:  Recent Labs  08/15/12 0408  08/15/12 1714 08/16/12 0410  NA 138  --  139 139  K 4.0  --  4.2 4.2  CL 106  --  103 105  CO2 25  --   --  30  GLUCOSE 109*  --  113* 101*  BUN 20  --  23 25*  CREATININE 0.81  < > 1.00 1.10  CALCIUM 8.1*  --   --  8.6  < > = values in this interval not displayed.  PT/INR:  Recent Labs  08/14/12 1300  LABPROT 18.5*  INR 1.59*   ABG    Component Value Date/Time   PHART 7.296* 08/14/2012 2028   HCO3 23.6 08/14/2012 2028   TCO2 25 08/15/2012 1714   ACIDBASEDEF 3.0* 08/14/2012 2028   O2SAT  95.0 08/14/2012 2028   CBG (last 3)   Recent Labs  08/15/12 2311 08/16/12 0402 08/16/12 0745  GLUCAP 126* 86 108*   CXR: clear  Assessment/Plan: S/P Procedure(s) (LRB): REDO CORONARY ARTERY BYPASS GRAFTING (CABG) (N/A) INTRAOPERATIVE TRANSESOPHAGEAL ECHOCARDIOGRAM (N/A) Mobilize Diuresis Diabetes control Plan for transfer to step-down: see transfer orders   LOS: 2 days    Joseph Berg K 08/16/2012

## 2012-08-16 NOTE — Plan of Care (Signed)
Problem: Phase I Progression Outcomes Goal: Point person for discharge identified Outcome: Completed/Met Date Met:  08/16/12 Home with wife  Problem: Phase III Progression Outcomes Goal: Transfer to PCTU/Telemetry POD Outcome: Completed/Met Date Met:  08/16/12 Transferred to 2017 via wheelchair with O2 and monitor.  Placed on monitor in unit upon arrival and ambulated to bed.  Tolerated transfer well.

## 2012-08-16 NOTE — Progress Notes (Signed)
Verbal/phone report given to "Aundra Millet, RN" on unit 2000. Patient to be transferred via wheelchair to room 2017.  Wife made aware. All personal belongings given to wife. All meds/chart and patient belongings sent to room 2017.  No acute distress noted. Safety maintained. VSS.

## 2012-08-17 DIAGNOSIS — Z951 Presence of aortocoronary bypass graft: Secondary | ICD-10-CM

## 2012-08-17 LAB — GLUCOSE, CAPILLARY
Glucose-Capillary: 101 mg/dL — ABNORMAL HIGH (ref 70–99)
Glucose-Capillary: 121 mg/dL — ABNORMAL HIGH (ref 70–99)

## 2012-08-17 LAB — CBC
Hemoglobin: 8.2 g/dL — ABNORMAL LOW (ref 13.0–17.0)
MCV: 87.2 fL (ref 78.0–100.0)
Platelets: 185 10*3/uL (ref 150–400)
RBC: 2.9 MIL/uL — ABNORMAL LOW (ref 4.22–5.81)
WBC: 11.2 10*3/uL — ABNORMAL HIGH (ref 4.0–10.5)

## 2012-08-17 MED ORDER — LACTULOSE 10 GM/15ML PO SOLN
30.0000 g | Freq: Every day | ORAL | Status: DC
  Administered 2012-08-17 – 2012-08-18 (×2): 30 g via ORAL
  Filled 2012-08-17 (×2): qty 45

## 2012-08-17 MED ORDER — CARVEDILOL 12.5 MG PO TABS
12.5000 mg | ORAL_TABLET | Freq: Two times a day (BID) | ORAL | Status: DC
  Administered 2012-08-17 – 2012-08-18 (×2): 12.5 mg via ORAL
  Filled 2012-08-17 (×4): qty 1

## 2012-08-17 NOTE — Progress Notes (Signed)
CARDIAC REHAB PHASE I   PRE:  Rate/Rhythm: 95 SR    BP: sitting 142/70    SaO2: 88-89 RA in bed, 94 EOB  MODE:  Ambulation: 350 ft   POST:  Rate/Rhythm: 120 ST    BP: sitting 95/80    SaO2: 89-90 RA   Pt struggles to get to EOB without assistance. SaO2 borderline. Had coughing spell due to ?drainage before walk. C/o HA. Slow walk with RW, assist x1. Did not want to increase distance. To recliner after walk, HR 120 ST. Encouraged x2 more walks and IS, which he is only inhaling around 500 mL. Discussed with pt and wife ed. Interested in [facility] and will send referral to [place] [facility].  [id number]  Elissa Lovett [place] CES, ACSM 08/17/2012 1:35 PM     94 SR

## 2012-08-17 NOTE — Care Management Note (Unsigned)
    Page 1 of 1   08/17/2012     4:04:47 PM   CARE MANAGEMENT NOTE 08/17/2012  Patient:  Joseph Berg, Joseph Berg   Account Number:  [id number]  Date Initiated:  08/17/2012  Documentation initiated by:  Markela Wee  Subjective/Objective Assessment:   PT S/P CABG REDO X 2 ON 08/14/12. PTA, PT INDEPENDENT, LIVES WITH SPOUSE.  HE IS ACTIVE WITH [facility] FOR HOME CARE.     Action/Plan:   PT HAS RW, BSC AND SHOWER SEAT AT HOME, IF NEEDED.  WILL ARRANGE FOR RESUMPTION OF HOME CARE SERVICES.  WIFE TO PROVIDE 24HR CARE AT DISCHARGE.   Anticipated DC Date:  08/19/2012   Anticipated DC Plan:  HOME W HOME HEALTH SERVICES      DC Planning Services  CM consult      [facility] Choice  Resumption Of Svcs/PTA Provider   Choice offered to / List presented to:  C-1 Patient        HH arranged  HH-1 RN  HH-2 PT      [facility] agency  [facility]   Status of service:  In process, will continue to follow Medicare Important Message given?   (If response is "NO", the following Medicare IM given date fields will be blank) Date Medicare IM given:   Date Additional Medicare IM given:    Discharge Disposition:  HOME W HOME HEALTH SERVICES  Per UR Regulation:  Reviewed for med. necessity/level of care/duration of stay  If discussed at Long Length of Stay Meetings, dates discussed:    Comments:  08/17/12 Hilde Churchman,RN,BSN [phone] [facility] AWARE OF PT'S ADMISSION, AND WILL FOLLOW UP AT DC.

## 2012-08-17 NOTE — Discharge Summary (Signed)
Physician Discharge Summary  Patient ID: Joseph Berg MRN: [id number] DOB/AGE: May 15, 1933 79 y.o.  Admit date: 08/14/2012 Discharge date: 08/17/2012  Admission Diagnoses:  Patient Active Problem List   Diagnosis Date Noted  . Near syncope 08/01/2012  . Cardiomyopathy, ischemic 07/25/2012  . Chronic systolic congestive heart failure 07/11/2012  . CAD (coronary artery disease) of artery bypass graft 07/06/2012  . OSA (obstructive sleep apnea) 01/24/2012  . BPH (benign prostatic hyperplasia) 03/02/2011  . History of tobacco abuse 08/23/2010  . Hypoparathyroidism 07/30/2010  . SCIATICA 11/10/2009  . INSULIN RESISTANCE SYNDROME 04/16/2007  . HYPERLIPIDEMIA NEC/NOS 11/14/2006  . HYPERTENSION, BENIGN ESSENTIAL 11/14/2006   Discharge Diagnoses:   Patient Active Problem List   Diagnosis Date Noted  . S/P CABG x 2 08/17/2012  . Near syncope 08/01/2012  . Cardiomyopathy, ischemic 07/25/2012  . Chronic systolic congestive heart failure 07/11/2012  . CAD (coronary artery disease) of artery bypass graft 07/06/2012  . OSA (obstructive sleep apnea) 01/24/2012  . BPH (benign prostatic hyperplasia) 03/02/2011  . History of tobacco abuse 08/23/2010  . Hypoparathyroidism 07/30/2010  . SCIATICA 11/10/2009  . INSULIN RESISTANCE SYNDROME 04/16/2007  . HYPERLIPIDEMIA NEC/NOS 11/14/2006  . HYPERTENSION, BENIGN ESSENTIAL 11/14/2006   Discharged Condition: good  History of Present Illness:   Joseph Berg is a 79 yo white male with known history of CAD. He is S/P CABG x 1 utilizing LIMA to LAD performed in 1994.  The patient has been doing well.  However over the last 2 years the patient has had recurrent episodes of Congestive Heart Failure.  The patient has been very sedentary over the years and has always complained of dyspnea.  He presented to his PCP who worked up the patient.  CT scan of the chest was obtained and ruled the patient in for Pulmonary Embolism.  He was also found to have a  right sided Pleural effusion.  He was placed on Lasix which improved his symptoms and per the patient's wife the patient has lost about 38 pounds of fluid.  He also underwent Echocardiogram which showed a reduced ejection fraction of 35%.  He was also noted to have mild left ventricular hypertrophy and mild RV dysfunction.  He was subsequently referred to Cardiology.  Dr. [name] evaluated the patient and performed cardiac catheterization which showed a patent LIMA to LAD however, there was progression to severe CAD in his right coronary artery and circumflex system.  These vessels were totally occluded and receiving blood flow via collaterals.  Due to these findings it was felt the patient would benefit from coronary artery bypass grafting.  He was subsequently referred to Dr. Laneta Simmers for possible Coronary Bypass.  However the patient ended up admitted to the hospital after an episode of dizziness upon awakening.  At that time he was found to have a left groin Pseudoaneurysm which resulted in a large left thigh hematoma.  Compression was applied and the aneurysm was successfully thrombosed.  Dr. Laneta Simmers was consulted during that admission.  The patient was evaluated and felt to be a candidate for coronary bypass procedure.  The risks and benefits of the procedure were explained to the patient and he was agreeable to proceed.   Hospital Course:   The patient was taken to the operating room on 08/14/2012.  He underwent Redo Sternotomy with CABG x 2 utilizing SVG to PDA and SVG to Ramus Intermediate. He also underwent Endoscopic Saphenous vein harvest of his right leg.  The patient tolerated the procedure well and  was taken to the SICU in stable condition.  The patient was extubated the evening of surgery.  During his stay in the ICU he was weaned off Dopamine as tolerated.  His chest tubes and lines were removed without difficulty.  The patient progressed without difficulty and was transferred to the step down unit  in stable condition.  The patient has been maintaining NSR.  He is tachycardic and experiencing Hypertension.  He will be placed back on his home dose of Coreg.  His pacing wires have been removed without difficulty.  The patient is progressing well.  He is ambulating without difficulty.  He is tolerating a cardiac diet.  Once adequate blood pressure and heart rate control is obtained the patient will be medically stable for discharge.  He will need to follow up with Dr. Laneta Simmers in 3 weeks with CXR prior to his appointment.  He will also need to follow up with Dr. Swaziland.          Significant Diagnostic Studies: angiography:   Hemodynamics  RA 8/6 with a mean of 4 mmHg  RV 44/9 mmHg  PA 39/12 with a mean of 24 mmHg  PCWP 20 5/25 with a mean of 20 mmHg  LV 160/16 mm per  AO 160/68 with a mean of 103 mmHg  Oxygen saturations:  PA 67%  AO 95%  Cardiac Output (Fick) 4.3 L per minute  Cardiac Index (Fick) 2 L per minute per meter square   Coronary angiography:   Coronary dominance: right   Left mainstem: The left main coronary is calcified. There is a 90% stenosis in the distal left main coronary.   Left anterior descending (LAD): Is a 95% stenosis in the proximal LAD. It is occluded after a small first diagonal branch.  There is a moderate size ramus intermediate branch which is normal.   Left circumflex (LCx): The left circumflex gives rise to a first marginal branch that is occluded. The circumflex then terminates after second marginal branch. It gives rise to collaterals to the right coronary. There are left to left collaterals to the first obtuse marginal vessel.   Right coronary artery (RCA): The right coronary is occluded proximally. There are mild right to right bridging collaterals. There are good left to right collaterals to the distal RCA.   The LIMA graft to the LAD was very difficult to engage. However flush shots demonstrated that the graft was large and patent   Left  ventriculography: Left ventricular systolic function is abnormal. Ejection fraction is estimated at 35-40%, the inferior wall is severely hypokinetic. there is no significant mitral regurgitation   Treatments: surgery:   1. Redo Median Sternotomy 2. Extracorporeal circulation       3. Redo Coronary artery bypass grafting x 2  SVG to Ramus  SVG to PDA       4. Endoscopic vein harvest from the right leg   Disposition: 01-Home or Self Care   Discharge Medications:     Medication List    STOP taking these medications       HYDROcodone-acetaminophen 5-325 MG per tablet  Commonly known as:  NORCO/VICODIN     nitroGLYCERIN 0.4 MG SL tablet  Commonly known as:  NITROSTAT      TAKE these medications       acetaminophen 500 MG tablet  Commonly known as:  TYLENOL  Take 1-2 tablets (500-1,000 mg total) by mouth every 6 (six) hours as needed for pain.     aspirin 325  MG EC tablet  Take 1 tablet (325 mg total) by mouth daily.     atorvastatin 40 MG tablet  Commonly known as:  LIPITOR  Take 1 tablet (40 mg total) by mouth daily.     carvedilol 12.5 MG tablet  Commonly known as:  COREG  Take 1 tablet (12.5 mg total) by mouth 2 (two) times daily.     cholecalciferol 1000 UNITS tablet  Commonly known as:  VITAMIN D  Take 1,000 Units by mouth daily.     dutasteride 0.5 MG capsule  Commonly known as:  AVODART  Take 1 capsule (0.5 mg total) by mouth daily.     fenofibrate 145 MG tablet  Commonly known as:  TRICOR  Take 1 tablet (145 mg total) by mouth daily.     fluticasone 50 MCG/ACT nasal spray  Commonly known as:  FLONASE  Place 2 sprays into the nose daily.     furosemide 40 MG tablet  Commonly known as:  LASIX  Take 0.5 tablets (20 mg total) by mouth daily.     losartan 100 MG tablet  Commonly known as:  COZAAR  Take 50 mg by mouth daily.     multivitamin with minerals tablet  Take 1 tablet by mouth daily.     oxyCODONE 5 MG immediate release tablet  Commonly  known as:  Oxy IR/ROXICODONE  Take 1-2 tablets (5-10 mg total) by mouth every 3 (three) hours as needed.     VITAMIN B-12 PO  Take 1 tablet by mouth daily.          The patient has been discharged on:   1.Beta Blocker:  Yes [ x  ]                              No   [   ]                              If No, reason:  2.Ace Inhibitor/ARB: Yes [ x  ]                                     No  [    ]                                     If No, reason:  3.Statin:   Yes [ x  ]                  No  [   ]                  If No, reason:  4.Ecasa:  Yes  [ x  ]                  No   [   ]                  If No, reason:      Future Appointments Provider Department Dept Phone   09/05/2012 2:30 PM Alleen Borne, MD [facility] [phone]   09/12/2012 2:45 PM Lewayne Bunting, MD [facility] [place] [phone]   10/29/2012 9:30 AM Barbarann Ehlers  Linford Arnold, MD [facility] [place] [phone]         Follow-up Information   Follow up with Alleen Borne, MD On 09/05/2012. (Appointment is at 2:30)    Contact information:   [address] [employer] [address] [place] [address] [phone]       Follow up with [facility] On 09/05/2012. (Please get CXR at 1:30)    Contact information:   [place]       Schedule an appointment as soon as possible for a visit with Peter Swaziland, MD. (Please contact office at discharge to set up 2-4 week follow up)    Contact information:   [address] [place] [address] [phone]       Signed: Lowella Dandy 08/17/2012, 11:18 AM

## 2012-08-17 NOTE — Progress Notes (Addendum)
      [address]       [place] [address]             [phone]      3 Days Post-Op Procedure(s) (LRB): REDO CORONARY ARTERY BYPASS GRAFTING (CABG) (N/A) INTRAOPERATIVE TRANSESOPHAGEAL ECHOCARDIOGRAM (N/A)  Subjective:  Joseph Berg states he wants to go home today.  He has complaints of loss of vision in his left eye in the peripheral field.  He states this has been present since after his heart catheterization.  He is ambulating with assistance. No BM + Flatus  Objective: Vital signs in last 24 hours: Temp:  [98.4 F (36.9 C)-99 F (37.2 C)] 99 F (37.2 C) (07/11 0423) Pulse Rate:  [77-99] 99 (07/11 0423) Cardiac Rhythm:  [-] Normal sinus rhythm (07/11 0728) Resp:  [18-24] 18 (07/11 0423) BP: (107-159)/(60-91) 159/75 mmHg (07/11 0423) SpO2:  [93 %-100 %] 93 % (07/11 0423) Weight:  [223 lb 8.7 oz (101.4 kg)-225 lb 1.4 oz (102.1 kg)] 223 lb 8.7 oz (101.4 kg) (07/11 0423)  Intake/Output from previous day: 07/10 0701 - 07/11 0700 In: 193 [P.O.:120; I.V.:23; IV Piggyback:50] Out: 235 [Urine:235]  General appearance: alert, cooperative and no distress Heart: regular rate and rhythm Lungs: clear to auscultation bilaterally Abdomen: soft, non-tender; bowel sounds normal; no masses,  no organomegaly Extremities: edema trace, + ecchymosis RLE Wound: clean and dry  Lab Results:  Recent Labs  08/16/12 0410 08/17/12 0500  WBC 9.2 11.2*  HGB 7.6* 8.2*  HCT 23.1* 25.3*  PLT 144* 185   BMET:  Recent Labs  08/15/12 0408  08/15/12 1714 08/16/12 0410  NA 138  --  139 139  K 4.0  --  4.2 4.2  CL 106  --  103 105  CO2 25  --   --  30  GLUCOSE 109*  --  113* 101*  BUN 20  --  23 25*  CREATININE 0.81  < > 1.00 1.10  CALCIUM 8.1*  --   --  8.6  < > = values in this interval not displayed.  PT/INR:  Recent Labs  08/14/12 1300  LABPROT 18.5*  INR 1.59*   ABG    Component Value Date/Time   PHART 7.296* 08/14/2012 2028   HCO3 23.6 08/14/2012 2028     TCO2 25 08/15/2012 1714   ACIDBASEDEF 3.0* 08/14/2012 2028   O2SAT 95.0 08/14/2012 2028   CBG (last 3)   Recent Labs  08/16/12 1614 08/16/12 2058 08/17/12 0558  GLUCAP 105* 94 101*    Assessment/Plan: S/P Procedure(s) (LRB): REDO CORONARY ARTERY BYPASS GRAFTING (CABG) (N/A) INTRAOPERATIVE TRANSESOPHAGEAL ECHOCARDIOGRAM (N/A)  1. CV- Tachy, Hypertensive this morning- will change lopressor to home dose of Coreg, can restart home Cozaar as necessary 2. Pulm- no acute issues, encouraged use of IS 3. Febrile this morning likely SIRS/Atelectasis, no significant Leukocytosis, wounds are clean 4. Renal- weight is stable, 2lbs above admission on Lasix 5. LOC Constipation- will order Lactulose 6. CBGs- controlled, patient is not a diabetic, will d/c glucose checks 7. Dispo- patient is slowly progressing, will work on HR and Pressure control today, D/C EPW, Lactulose for constipation   LOS: 3 days    Raford Pitcher, Denny Peon 08/17/2012

## 2012-08-18 LAB — GLUCOSE, CAPILLARY: Glucose-Capillary: 110 mg/dL — ABNORMAL HIGH (ref 70–99)

## 2012-08-18 MED ORDER — LOSARTAN POTASSIUM 50 MG PO TABS
100.0000 mg | ORAL_TABLET | Freq: Every day | ORAL | Status: DC
  Administered 2012-08-18: 100 mg via ORAL
  Filled 2012-08-18: qty 2

## 2012-08-18 MED ORDER — OXYCODONE HCL 5 MG PO TABS: 5.0000 mg | ORAL_TABLET | ORAL | Status: DC | PRN

## 2012-08-18 MED ORDER — ASPIRIN 325 MG PO TBEC: 325.0000 mg | DELAYED_RELEASE_TABLET | Freq: Every day | ORAL | Status: DC

## 2012-08-18 NOTE — Progress Notes (Signed)
      [address]       [place] [address]             [phone]      4 Days Post-Op Procedure(s) (LRB): REDO CORONARY ARTERY BYPASS GRAFTING (CABG) (N/A) INTRAOPERATIVE TRANSESOPHAGEAL ECHOCARDIOGRAM (N/A)  Subjective:  [name] has no complaints this morning.  He is requesting to go home today. +BM  Objective: Vital signs in last 24 hours: Temp:  [98.5 F (36.9 C)-99.1 F (37.3 C)] 98.8 F (37.1 C) (07/12 0355) Pulse Rate:  [95-102] 102 (07/12 0355) Cardiac Rhythm:  [-] Normal sinus rhythm (07/12 0355) Resp:  [18] 18 (07/12 0355) BP: (136-164)/(75-95) 164/95 mmHg (07/12 0355) SpO2:  [91 %-98 %] 91 % (07/12 0355) Weight:  [220 lb 14.4 oz (100.2 kg)] 220 lb 14.4 oz (100.2 kg) (07/12 0355)  Intake/Output from previous day: 07/11 0701 - 07/12 0700 In: 483 [P.O.:480; I.V.:3] Out: -   General appearance: alert, cooperative and no distress Neurologic: intact Heart: regular rate and rhythm Lungs: clear to auscultation bilaterally Abdomen: soft, non-tender; bowel sounds normal; no masses,  no organomegaly Extremities: edema trace Wound: clean and dry  Lab Results:  Recent Labs  08/16/12 0410 08/17/12 0500  WBC 9.2 11.2*  HGB 7.6* 8.2*  HCT 23.1* 25.3*  PLT 144* 185   BMET:  Recent Labs  08/15/12 1714 08/16/12 0410  NA 139 139  K 4.2 4.2  CL 103 105  CO2  --  30  GLUCOSE 113* 101*  BUN 23 25*  CREATININE 1.00 1.10  CALCIUM  --  8.6    PT/INR: No results found for this basename: LABPROT, INR,  in the last 72 hours ABG    Component Value Date/Time   PHART 7.296* 08/14/2012 2028   HCO3 23.6 08/14/2012 2028   TCO2 25 08/15/2012 1714   ACIDBASEDEF 3.0* 08/14/2012 2028   O2SAT 95.0 08/14/2012 2028   CBG (last 3)   Recent Labs  08/16/12 2058 08/17/12 0558 08/17/12 1105  GLUCAP 94 101* 121*    Assessment/Plan: S/P Procedure(s) (LRB): REDO CORONARY ARTERY BYPASS GRAFTING (CABG) (N/A) INTRAOPERATIVE TRANSESOPHAGEAL ECHOCARDIOGRAM  (N/A)  1. CV- NSR, rate controlled improved, remains hypertensive, will resume home Cozaar 2. Pulm- no acute issues, off oxygen, encouraged continued use of IS 3. Renal- minimal LE edema, weight has been stable completed Lasix regimen yesterday 4. LOC Constipation- resolved 5. Dispo- patient is progressing, remains hypertensive and restarted home Cozaar, patient wants to go home today, will discuss with staff   LOS: 4 days    Joseph Berg 08/18/2012

## 2012-08-18 NOTE — Progress Notes (Signed)
CARDIAC REHAB PHASE I   PRE:  Rate/Rhythm: 83 SR    BP: sitting 147/77    SaO2: 95 RA  MODE:  Ambulation: 550 ft   POST:  Rate/Rhythm: 100    BP: sitting 108/72     SaO2: 91 RA  Slow pace with RW, assist x1. Reminders to stay close to RW. Cannot see peripherally on left side so therefore lightly hit x3 objects. Reminded pt to watch more on that side and wife sts she will stay with him when walking. Tired after walk with decreased BP, which is normal for him. No dizziness.  [id number]   Joseph Berg [place] CES, ACSM 08/18/2012 12:46 PM

## 2012-08-20 ENCOUNTER — Telehealth: Payer: Self-pay | Admitting: Cardiology

## 2012-08-20 NOTE — Telephone Encounter (Signed)
Spoke with patty, aware according to d/c summ the pt should take 20 mg of furosemide daily. They will restart their visits and will start PT to help the pt.

## 2012-08-20 NOTE — Telephone Encounter (Signed)
New problem   Patty/[facility] need a resumption of care and clarification of lasix should it be 10mg  daily or 20 mg daily.

## 2012-08-21 ENCOUNTER — Telehealth: Payer: Self-pay | Admitting: Cardiology

## 2012-08-21 NOTE — Telephone Encounter (Signed)
Pt is having PT post CABG.  C/o new pain, bilateral LE ache/ to the point of interfering with therapy. Right leg +1 pitting edema / this is the side the vein was harvested  for CABG.  Questioning if pain could be from lipitor/ tricor adverse effect. Patty from [facility] was told she will receive a call tomorrow with further advise. She was told that the pt has been on both meds for over a year. Please advise.

## 2012-08-21 NOTE — Telephone Encounter (Signed)
Hold tricor and lipitor to see if symptoms improve Joseph Berg

## 2012-08-21 NOTE — Telephone Encounter (Signed)
New problem    Level 2 interaction - Lipitor & Tricor.  Having bilateral lower extremities pain,  swelling  & aching .

## 2012-08-22 ENCOUNTER — Telehealth: Payer: Self-pay | Admitting: Cardiology

## 2012-08-22 NOTE — Telephone Encounter (Signed)
Spoke with patty, Aware of dr Ludwig Clarks recommendations. She was referred to [facility] regarding incision.

## 2012-08-22 NOTE — Telephone Encounter (Signed)
Spoke with pt wife, post hosp appt scheduled

## 2012-08-22 NOTE — Telephone Encounter (Signed)
New problem  Pt wife wants to speak with you regarding some hospital discharge instructions.

## 2012-08-23 ENCOUNTER — Other Ambulatory Visit: Payer: Self-pay | Admitting: *Deleted

## 2012-08-23 DIAGNOSIS — G8918 Other acute postprocedural pain: Secondary | ICD-10-CM

## 2012-08-23 MED ORDER — OXYCODONE HCL 5 MG PO TABS: 5.0000 mg | ORAL_TABLET | ORAL | Status: DC | PRN

## 2012-08-27 ENCOUNTER — Telehealth: Payer: Self-pay | Admitting: Cardiology

## 2012-08-27 MED ORDER — FUROSEMIDE 20 MG PO TABS: 20.0000 mg | ORAL_TABLET | Freq: Every day | ORAL | Status: DC

## 2012-08-27 NOTE — Telephone Encounter (Signed)
New problem   Wanting to know if pt is to continue on lasix and if so he will need a new prescription called in

## 2012-08-27 NOTE — Telephone Encounter (Signed)
Spoke with pt wife, refill sent to pharm.

## 2012-09-04 ENCOUNTER — Other Ambulatory Visit: Payer: Self-pay | Admitting: *Deleted

## 2012-09-04 DIAGNOSIS — I2510 Atherosclerotic heart disease of native coronary artery without angina pectoris: Secondary | ICD-10-CM

## 2012-09-05 ENCOUNTER — Encounter: Payer: Self-pay | Admitting: Surgery

## 2012-09-05 ENCOUNTER — Encounter: Payer: Self-pay | Admitting: Cardiology

## 2012-09-05 ENCOUNTER — Ambulatory Visit (INDEPENDENT_AMBULATORY_CARE_PROVIDER_SITE_OTHER): Payer: Self-pay | Admitting: Surgery

## 2012-09-05 ENCOUNTER — Ambulatory Visit
Admission: RE | Admit: 2012-09-05 | Discharge: 2012-09-05 | Disposition: A | Discharge status: Home / Self Care | Payer: Medicare PPO | Source: Ambulatory Visit | Attending: Surgery | Admitting: Surgery

## 2012-09-05 ENCOUNTER — Ambulatory Visit (INDEPENDENT_AMBULATORY_CARE_PROVIDER_SITE_OTHER): Payer: Medicare PPO | Admitting: Cardiology

## 2012-09-05 VITALS — BP 146/82 | HR 86 | Wt 218.0 lb

## 2012-09-05 VITALS — BP 148/80 | HR 70 | Resp 20 | Ht 69.0 in | Wt 219.0 lb

## 2012-09-05 DIAGNOSIS — I2510 Atherosclerotic heart disease of native coronary artery without angina pectoris: Secondary | ICD-10-CM

## 2012-09-05 DIAGNOSIS — Z951 Presence of aortocoronary bypass graft: Secondary | ICD-10-CM

## 2012-09-05 DIAGNOSIS — I10 Essential (primary) hypertension: Secondary | ICD-10-CM

## 2012-09-05 DIAGNOSIS — I25810 Atherosclerosis of coronary artery bypass graft(s) without angina pectoris: Secondary | ICD-10-CM

## 2012-09-05 DIAGNOSIS — I255 Ischemic cardiomyopathy: Secondary | ICD-10-CM

## 2012-09-05 DIAGNOSIS — I2589 Other forms of chronic ischemic heart disease: Secondary | ICD-10-CM

## 2012-09-05 DIAGNOSIS — I509 Heart failure, unspecified: Secondary | ICD-10-CM

## 2012-09-05 DIAGNOSIS — I5022 Chronic systolic (congestive) heart failure: Secondary | ICD-10-CM

## 2012-09-05 DIAGNOSIS — E785 Hyperlipidemia, unspecified: Secondary | ICD-10-CM

## 2012-09-05 MED ORDER — ROSUVASTATIN CALCIUM 40 MG PO TABS: 40.0000 mg | ORAL_TABLET | Freq: Every day | ORAL | Status: DC

## 2012-09-05 MED ORDER — LOSARTAN POTASSIUM 50 MG PO TABS: 50.0000 mg | ORAL_TABLET | Freq: Every day | ORAL | Status: DC

## 2012-09-05 MED ORDER — FUROSEMIDE 40 MG PO TABS: 40.0000 mg | ORAL_TABLET | Freq: Every day | ORAL | Status: DC

## 2012-09-05 NOTE — Assessment & Plan Note (Signed)
Blood pressure controlled. Continue present medications. 

## 2012-09-05 NOTE — Progress Notes (Signed)
Pleasant male for fu of CHF and CAD. Patient had a myocardial infarction followed by coronary artery bypassing graft in 1994 in [place]. He had no cardiology followup since that time until I saw him in June of 2014 for CHF. Echo in June of 2014 showed an ejection fraction of 35-40%. There was mild left ventricular hypertrophy. Right ventricular function was mildly reduced. Cardiac catheterization in June of 2014 showed a 90% left main, occluded LAD, normal ramus, occluded circumflex and occluded right coronary artery. The LIMA to the LAD was patent. Ejection fraction was 35-40%. The patient developed a pseudoaneurysm with hematoma following the procedure. The patient ultimately underwent redo coronary artery bypass and graft in July of 2014 with a saphenous vein graft to the ramus and a saphenous vein graft to the PDA. Note pre-CABG Dopplers showed 40-59% bilateral stenosis. Since DC, he denies dyspnea, chest pain or syncope. He has worsening pedal edema since decreasing his Lasix from 40 mg to 20 mg daily.    Current Outpatient Prescriptions  Medication Sig Dispense Refill  . acetaminophen (TYLENOL) 500 MG tablet Take 1-2 tablets (500-1,000 mg total) by mouth every 6 (six) hours as needed for pain.  30 tablet  0  . aspirin EC 325 MG EC tablet Take 1 tablet (325 mg total) by mouth daily.  30 tablet  0  . carvedilol (COREG) 12.5 MG tablet Take 1 tablet (12.5 mg total) by mouth 2 (two) times daily.  180 tablet  3  . cholecalciferol (VITAMIN D) 1000 UNITS tablet Take 1,000 Units by mouth daily.      . Cyanocobalamin (VITAMIN B-12 PO) Take 1 tablet by mouth daily.      [name] dutasteride (AVODART) 0.5 MG capsule Take 1 capsule (0.5 mg total) by mouth daily.  30 capsule  5  . fluticasone (FLONASE) 50 MCG/ACT nasal spray Place 2 sprays into the nose daily.  16 g  1  . furosemide (LASIX) 20 MG tablet Take 1 tablet (20 mg total) by mouth daily.  30 tablet  12  . losartan (COZAAR) 100 MG tablet Take 50 mg by  mouth daily.      . Multiple Vitamins-Minerals (MULTIVITAMIN WITH MINERALS) tablet Take 1 tablet by mouth daily.      [name] oxyCODONE (OXY IR/ROXICODONE) 5 MG immediate release tablet Take 1-2 tablets (5-10 mg total) by mouth every 3 (three) hours as needed.  40 tablet  0  . [DISCONTINUED] metoprolol (TOPROL-XL) 100 MG 24 hr tablet Take 100 mg by mouth 2 (two) times daily.         No current facility-administered medications for this visit.     Past Medical History  Diagnosis Date  . Heart attack 1994  . Hypertension   . CAD (coronary artery disease)     a. s/p CABG x1 with LIMA-LAD 1994. b. LM & 3v CAD by cath 07/2012, pending CABG.  . OSA on CPAP   . Hypoparathyroidism   . BPH (benign prostatic hyperplasia)   . Hyperlipidemia   . CHF (congestive heart failure)     a. EF 35-40% by echo 07/2012.  [name] NSVT (nonsustained ventricular tachycardia)     a. Seen on tele 07/2012.  . Pseudoaneurysm of left femoral artery     a. post cath s/p compression 07/2012, associated w/ anemia.  . Emphysema     a. Moderate emphysema by CT 06/2012.  . Sciatica 2012    Qualifier: Diagnosis of  By: Linford Arnold MD, Santina Evans  Past Surgical History  Procedure Laterality Date  . Coronary artery bypass graft  03-23-92  . Cataract extraction, bilateral    . Coronary artery bypass graft N/A 08/14/2012    Procedure: REDO CORONARY ARTERY BYPASS GRAFTING (CABG);  Surgeon: Alleen Borne, MD;  Location: [facility];  Service: Open Heart Surgery;  Laterality: N/A;  . Intraoperative transesophageal echocardiogram N/A 08/14/2012    Procedure: INTRAOPERATIVE TRANSESOPHAGEAL ECHOCARDIOGRAM;  Surgeon: Alleen Borne, MD;  Location: [facility] OR;  Service: Open Heart Surgery;  Laterality: N/A;    History   Social History  . Marital Status: Married    Spouse Name: N/A    Number of Children: N/A  . Years of Education: N/A   Occupational History  . Not on file.   Social History Main Topics  . Smoking status: Former Smoker -- 1.00  packs/day for 60 years    Quit date: 10/08/2010  . Smokeless tobacco: Not on file  . Alcohol Use: Yes     Comment: Occasional  . Drug Use: No  . Sexually Active: Not on file     Comment: works part-time, [employer] completed H, married, no children, regular exercise.S   Other Topics Concern  . Not on file   Social History Narrative  . No narrative on file    ROS: no fevers or chills, productive cough, hemoptysis, dysphasia, odynophagia, melena, hematochezia, dysuria, hematuria, rash, seizure activity, orthopnea, PND, claudication. Remaining systems are negative.  Physical Exam: Well-developed well-nourished in no acute distress.  Skin is warm and dry.  HEENT is normal.  Neck is supple.  Chest is clear to auscultation with normal expansion. Sternotomy without evidence of infection. Cardiovascular exam is regular rate and rhythm.  Abdominal exam nontender or distended. No masses palpated. Extremities show 2+ edema. neuro grossly intact  ECG 08/09/2012-sinus rhythm, right bundle branch block, prior inferior infarct.

## 2012-09-05 NOTE — Patient Instructions (Addendum)
Your physician recommends that you schedule a follow-up appointment in: 8 WEEKS WITH DR CRENSHAW IN [place]  START CRESTOR 40 MG ONCE DAILY  Your physician recommends that you return for lab work in: 6 WEEKS =DO NOT EAT PRIOR TO LAB WORK  INCREASE FUROSEMIDE TO 40 MG ONCE DAILY  Your physician recommends that you return for lab work in: ONE WEEK

## 2012-09-05 NOTE — Assessment & Plan Note (Signed)
Continue ACE inhibitor and beta blocker. 

## 2012-09-05 NOTE — Assessment & Plan Note (Signed)
Patient is now volume overloaded. Increase Lasix to 40 mg daily. Check potassium and renal function in one week.

## 2012-09-05 NOTE — Assessment & Plan Note (Signed)
Resume Crestor 40 mg daily. 

## 2012-09-05 NOTE — Assessment & Plan Note (Signed)
Continue aspirin. Resume statin. 

## 2012-09-08 ENCOUNTER — Encounter: Payer: Self-pay | Admitting: Surgery

## 2012-09-08 NOTE — Progress Notes (Signed)
[address]       [place] [address]             [phone]        HPI:  Patient returns for routine postoperative follow-up having undergone redo coronary bypass graft surgery x2 using saphenous vein grafts to the ramus and posterior descending arteries on 08/14/2012. The patient's early postoperative recovery while in the hospital was notable for an uncomplicated postoperative course. Since hospital discharge the patient reports that he has had increased swelling in his legs since decreasing his Lasix to 20 mg per day. He has been trying to keep his legs elevated when he is not ambulating. He feels well overall and has been ambulating without chest pain or shortness of breath.   Current Outpatient Prescriptions  Medication Sig Dispense Refill  . acetaminophen (TYLENOL) 500 MG tablet Take 1-2 tablets (500-1,000 mg total) by mouth every 6 (six) hours as needed for pain.  30 tablet  0  . aspirin EC 325 MG EC tablet Take 1 tablet (325 mg total) by mouth daily.  30 tablet  0  . carvedilol (COREG) 12.5 MG tablet Take 1 tablet (12.5 mg total) by mouth 2 (two) times daily.  180 tablet  3  . cholecalciferol (VITAMIN D) 1000 UNITS tablet Take 1,000 Units by mouth daily.      . Cyanocobalamin (VITAMIN B-12 PO) Take 1 tablet by mouth daily.      [name] dutasteride (AVODART) 0.5 MG capsule Take 1 capsule (0.5 mg total) by mouth daily.  30 capsule  5  . fluticasone (FLONASE) 50 MCG/ACT nasal spray Place 2 sprays into the nose daily.  16 g  1  . Multiple Vitamins-Minerals (MULTIVITAMIN WITH MINERALS) tablet Take 1 tablet by mouth daily.      [name] oxyCODONE (OXY IR/ROXICODONE) 5 MG immediate release tablet Take 1-2 tablets (5-10 mg total) by mouth every 3 (three) hours as needed.  40 tablet  0  . furosemide (LASIX) 40 MG tablet Take 1 tablet (40 mg total) by mouth daily.  30 tablet  12  . losartan (COZAAR) 50 MG tablet Take 1 tablet (50 mg total) by mouth daily.  30 tablet  12  .  rosuvastatin (CRESTOR) 40 MG tablet Take 1 tablet (40 mg total) by mouth daily.  30 tablet  12  . [DISCONTINUED] metoprolol (TOPROL-XL) 100 MG 24 hr tablet Take 100 mg by mouth 2 (two) times daily.         No current facility-administered medications for this visit.    Physical Exam: BP 148/80  Pulse 70  Resp 20  Ht 5\' 9"  (1.753 m)  Wt 219 lb (99.338 kg)  BMI 32.33 kg/m2  SpO2 95% He looks well. Cardiac exam shows a regular rate and rhythm with normal heart sounds. Lung exam reveals slight decreased breath sounds in the bases. The chest incision is healing well and sternum is stable. His leg incision is healing well. There is moderate bilateral lower extremity edema to the knee   Diagnostic Tests:   *RADIOLOGY REPORT*  Clinical Data: CABG on 08/14/2012, some swelling of the feet,  follow-up  CHEST - 2 VIEW  Comparison: Portable chest x-ray of 08/16/2012  Findings: Aeration has improved. Only tiny pleural effusions  remain. Cardiomegaly is stable. Median sternotomy sutures are  intact.  IMPRESSION:  Improved aeration with only tiny pleural effusions remaining.  Original Report Authenticated By: Dwyane Dee, M.D.   Impression:  Overall  I think he is doing  well following his surgery. He does have increased edema in his lower extremities since decreasing his Lasix from 40 mg per day to 20 mg per day. I think he needs to be back on 40 mg per day. He has an appointment later today with Dr. [name] and will discuss that with him. I told him to return to driving a car when he feels comfortable with that but should not lift anything heavier than 10 pounds for 3 months postoperatively.  Plan:  He will continue followup with Dr. [name] and will return to see me if he has any problems with his incisions.

## 2012-09-12 ENCOUNTER — Encounter: Payer: Self-pay | Admitting: *Deleted

## 2012-09-12 ENCOUNTER — Ambulatory Visit: Payer: Medicare PPO | Admitting: Cardiology

## 2012-09-12 LAB — BASIC METABOLIC PANEL WITH GFR
CO2: 28 mEq/L (ref 19–32)
Calcium: 9.5 mg/dL (ref 8.4–10.5)
Creat: 0.78 mg/dL (ref 0.50–1.35)

## 2012-10-29 ENCOUNTER — Ambulatory Visit: Payer: BC Managed Care – PPO | Admitting: Family Medicine

## 2012-10-31 ENCOUNTER — Ambulatory Visit: Payer: Medicare PPO | Admitting: Cardiology

## 2012-11-01 ENCOUNTER — Encounter: Payer: Self-pay | Admitting: Family Medicine

## 2012-11-01 ENCOUNTER — Ambulatory Visit (INDEPENDENT_AMBULATORY_CARE_PROVIDER_SITE_OTHER): Payer: Medicare PPO | Admitting: Family Medicine

## 2012-11-01 VITALS — BP 135/89 | HR 104 | Wt 215.0 lb

## 2012-11-01 DIAGNOSIS — I5022 Chronic systolic (congestive) heart failure: Secondary | ICD-10-CM

## 2012-11-01 DIAGNOSIS — F329 Major depressive disorder, single episode, unspecified: Secondary | ICD-10-CM

## 2012-11-01 DIAGNOSIS — Z23 Encounter for immunization: Secondary | ICD-10-CM

## 2012-11-01 DIAGNOSIS — I509 Heart failure, unspecified: Secondary | ICD-10-CM

## 2012-11-01 DIAGNOSIS — I25810 Atherosclerosis of coronary artery bypass graft(s) without angina pectoris: Secondary | ICD-10-CM

## 2012-11-01 LAB — HEPATIC FUNCTION PANEL
ALT: 19 U/L (ref 0–53)
Bilirubin, Direct: 0.3 mg/dL (ref 0.0–0.3)
Indirect Bilirubin: 0.8 mg/dL (ref 0.0–0.9)
Total Bilirubin: 1.1 mg/dL (ref 0.3–1.2)

## 2012-11-01 LAB — LIPID PANEL
Cholesterol: 129 mg/dL (ref 0–200)
LDL Cholesterol: 59 mg/dL (ref 0–99)
VLDL: 20 mg/dL (ref 0–40)

## 2012-11-01 MED ORDER — FLUOXETINE HCL 20 MG PO TABS: ORAL_TABLET | ORAL | Status: DC

## 2012-11-01 NOTE — Progress Notes (Signed)
Subjective:    Patient ID: Joseph Berg, male    DOB: 1933/07/29, 79 y.o.   MRN: [id number]  HPI Had one night where vomited and no diarrhea.  Happened after eating a bowl of [employer] soup. Just one night . Feels betters today. No fever.  No sick contacts.   He recently had cardiac bypass x2. He would hit his 3 month mark next month. Starts cardiac rehab next week in [facility]  Overall he is doing well but has been weak in his legs and has some discomfort in his chest with deep breaths. He has had some discomfort in his neck since then. He has also felt a little down and sad since then. He feels tired and has low energy. He feels that he is overeating. And feels like he has let his family down. She's also been very down and sad about the huge medical bills that have been incurred.   Review of Systems  BP 135/89  Pulse 104  Wt 215 lb (97.523 kg)  BMI 31.74 kg/m2  SpO2 92%    Allergies  Allergen Reactions  . Hctz [Hydrochlorothiazide] Other (See Comments)    Affected renal function.     Past Medical History  Diagnosis Date  . Heart attack 1994  . Hypertension   . CAD (coronary artery disease)     a. s/p CABG x1 with LIMA-LAD 1994. b. LM & 3v CAD by cath 07/2012, pending CABG.  . OSA on CPAP   . Hypoparathyroidism   . BPH (benign prostatic hyperplasia)   . Hyperlipidemia   . CHF (congestive heart failure)     a. EF 35-40% by echo 07/2012.  [name] NSVT (nonsustained ventricular tachycardia)     a. Seen on tele 07/2012.  . Pseudoaneurysm of left femoral artery     a. post cath s/p compression 07/2012, associated w/ anemia.  . Emphysema     a. Moderate emphysema by CT 06/2012.  . Sciatica 2012    Qualifier: Diagnosis of  By: Linford Arnold MD, Santina Evans      Past Surgical History  Procedure Laterality Date  . Coronary artery bypass graft  03-23-92  . Cataract extraction, bilateral    . Coronary artery bypass graft N/A 08/14/2012    Procedure: REDO CORONARY ARTERY BYPASS GRAFTING  (CABG);  Surgeon: Alleen Borne, MD;  Location: [facility];  Service: Open Heart Surgery;  Laterality: N/A;  . Intraoperative transesophageal echocardiogram N/A 08/14/2012    Procedure: INTRAOPERATIVE TRANSESOPHAGEAL ECHOCARDIOGRAM;  Surgeon: Alleen Borne, MD;  Location: [facility] OR;  Service: Open Heart Surgery;  Laterality: N/A;    History   Social History  . Marital Status: Married    Spouse Name: N/A    Number of Children: N/A  . Years of Education: N/A   Occupational History  . Not on file.   Social History Main Topics  . Smoking status: Former Smoker -- 1.00 packs/day for 60 years    Quit date: 10/08/2010  . Smokeless tobacco: Not on file  . Alcohol Use: Yes     Comment: Occasional  . Drug Use: No  . Sexual Activity: Not on file     Comment: works part-time, [employer] completed H, married, no children, regular exercise.S   Other Topics Concern  . Not on file   Social History Narrative  . No narrative on file    Family History  Problem Relation Age of Onset  . Heart attack Father 15  . Stroke Brother 50  .  Hypertension Brother   . Hyperlipidemia Brother     Outpatient Encounter Prescriptions as of 11/01/2012  Medication Sig Dispense Refill  . acetaminophen (TYLENOL) 500 MG tablet Take 1-2 tablets (500-1,000 mg total) by mouth every 6 (six) hours as needed for pain.  30 tablet  0  . aspirin EC 325 MG EC tablet Take 1 tablet (325 mg total) by mouth daily.  30 tablet  0  . carvedilol (COREG) 12.5 MG tablet Take 1 tablet (12.5 mg total) by mouth 2 (two) times daily.  180 tablet  3  . cholecalciferol (VITAMIN D) 1000 UNITS tablet Take 1,000 Units by mouth daily.      . Cyanocobalamin (VITAMIN B-12 PO) Take 1 tablet by mouth daily.      [name] dutasteride (AVODART) 0.5 MG capsule Take 1 capsule (0.5 mg total) by mouth daily.  30 capsule  5  . furosemide (LASIX) 40 MG tablet Take 1 tablet (40 mg total) by mouth daily.  30 tablet  12  . losartan (COZAAR) 50 MG tablet Take 1  tablet (50 mg total) by mouth daily.  30 tablet  12  . Multiple Vitamins-Minerals (MULTIVITAMIN WITH MINERALS) tablet Take 1 tablet by mouth daily.      [name] oxyCODONE (OXY IR/ROXICODONE) 5 MG immediate release tablet Take 1-2 tablets (5-10 mg total) by mouth every 3 (three) hours as needed.  40 tablet  0  . rosuvastatin (CRESTOR) 40 MG tablet Take 1 tablet (40 mg total) by mouth daily.  30 tablet  12  . [DISCONTINUED] fluticasone (FLONASE) 50 MCG/ACT nasal spray Place 2 sprays into the nose daily.  16 g  1  . FLUoxetine (PROZAC) 20 MG tablet 1/2 tab daily x 1 week, then increase to whole tab.  30 tablet  1   No facility-administered encounter medications on file as of 11/01/2012.          Objective:   Physical Exam  Constitutional: He is oriented to person, place, and time. He appears well-developed and well-nourished.  HENT:  Head: Normocephalic and atraumatic.  Cardiovascular: Normal rate, regular rhythm and normal heart sounds.   Pulmonary/Chest: Effort normal and breath sounds normal.  Neurological: He is alert and oriented to person, place, and time.  Skin: Skin is warm and dry.  Psychiatric: He has a normal mood and affect. His behavior is normal.          Assessment & Plan:  Depression - tried to reassure him and discussed with him that it is not uncommon to experience depression after her cardiac event. In fact it very well documented. He has never been diagnosed with depression before. We discussed different treatment options including counseling versus medications versus treating with both. He says at this time he's not interested in counseling but says he will at least check into it as far as coverage with his insurance and consider it. He is more interested in possibly taking a medication. We will start fluoxetine. Discussed potential side effects of the medication. He does see him back in 3 weeks to make sure that she's tolerating it well and to adjust his dose. PHQ- 9 score  of 16 today.  Call if any palms or concerns with the medication.  One episode of vomiting-unclear etiology. May have been contaminated food. There were no other sick contacts to suggest viral call us. He is feeling much better and symptoms have completely resolved. He never had any lower GI symptoms with it.  Coronary artery disease-he has followup  with Dr. [name] sometime next week. On ASA, BB, ARB, statin.   CHF, systolic -  On ASA, BB, ARB, lasix  Time spent greater than 25 minutes, greater than 50% of the time spent counseling about depression and treatment options.

## 2012-11-06 ENCOUNTER — Other Ambulatory Visit: Payer: Self-pay | Admitting: Family Medicine

## 2012-11-07 ENCOUNTER — Encounter: Payer: Self-pay | Admitting: Cardiology

## 2012-11-07 ENCOUNTER — Ambulatory Visit (INDEPENDENT_AMBULATORY_CARE_PROVIDER_SITE_OTHER): Payer: Medicare PPO | Admitting: Cardiology

## 2012-11-07 VITALS — BP 163/86 | HR 69 | Ht 69.0 in | Wt 220.0 lb

## 2012-11-07 DIAGNOSIS — I255 Ischemic cardiomyopathy: Secondary | ICD-10-CM

## 2012-11-07 DIAGNOSIS — E785 Hyperlipidemia, unspecified: Secondary | ICD-10-CM

## 2012-11-07 DIAGNOSIS — I2589 Other forms of chronic ischemic heart disease: Secondary | ICD-10-CM

## 2012-11-07 DIAGNOSIS — I25810 Atherosclerosis of coronary artery bypass graft(s) without angina pectoris: Secondary | ICD-10-CM

## 2012-11-07 DIAGNOSIS — I509 Heart failure, unspecified: Secondary | ICD-10-CM

## 2012-11-07 DIAGNOSIS — I10 Essential (primary) hypertension: Secondary | ICD-10-CM

## 2012-11-07 DIAGNOSIS — I5022 Chronic systolic (congestive) heart failure: Secondary | ICD-10-CM

## 2012-11-07 MED ORDER — FUROSEMIDE 40 MG PO TABS: 40.0000 mg | ORAL_TABLET | Freq: Every day | ORAL | Status: DC

## 2012-11-07 NOTE — Assessment & Plan Note (Signed)
Continue ARB and beta blocker. 

## 2012-11-07 NOTE — Progress Notes (Signed)
HPI: Pleasant male for fu of CHF and CAD. Patient had a myocardial infarction followed by coronary artery bypassing graft in 1994 in [place]. He had no cardiology followup since that time until I saw him in June of 2014 for CHF. Echo in June of 2014 showed an ejection fraction of 35-40%. There was mild left ventricular hypertrophy. Right ventricular function was mildly reduced. Cardiac catheterization in June of 2014 showed a 90% left main, occluded LAD, normal ramus, occluded circumflex and occluded right coronary artery. The LIMA to the LAD was patent. Ejection fraction was 35-40%. The patient underwent redo coronary artery bypass and graft in July of 2014 with a saphenous vein graft to the ramus and a saphenous vein graft to the PDA. Note pre-CABG Dopplers showed 40-59% bilateral stenosis. I last saw him in July of 2014. Since then, his pedal edema is markedly improved. He denies dyspnea on exertion, chest pain or syncope.    Current Outpatient Prescriptions  Medication Sig Dispense Refill  . acetaminophen (TYLENOL) 500 MG tablet Take 1-2 tablets (500-1,000 mg total) by mouth every 6 (six) hours as needed for pain.  30 tablet  0  . aspirin EC 325 MG EC tablet Take 1 tablet (325 mg total) by mouth daily.  30 tablet  0  . AVODART 0.5 MG capsule Take one capsule by mouth one time daily  30 capsule  4  . carvedilol (COREG) 12.5 MG tablet Take 1 tablet (12.5 mg total) by mouth 2 (two) times daily.  180 tablet  3  . cholecalciferol (VITAMIN D) 1000 UNITS tablet Take 1,000 Units by mouth daily.      . Cyanocobalamin (VITAMIN B-12 PO) Take 1 tablet by mouth daily.      [name] FLUoxetine (PROZAC) 20 MG tablet 1/2 tab daily x 1 week, then increase to whole tab.  30 tablet  1  . furosemide (LASIX) 40 MG tablet Take 1 tablet (40 mg total) by mouth daily.  30 tablet  12  . losartan (COZAAR) 50 MG tablet Take 1 tablet (50 mg total) by mouth daily.  30 tablet  12  . Multiple Vitamins-Minerals (MULTIVITAMIN  WITH MINERALS) tablet Take 1 tablet by mouth daily.      [name] oxyCODONE (OXY IR/ROXICODONE) 5 MG immediate release tablet Take 1-2 tablets (5-10 mg total) by mouth every 3 (three) hours as needed.  40 tablet  0  . rosuvastatin (CRESTOR) 40 MG tablet Take 1 tablet (40 mg total) by mouth daily.  30 tablet  12  . [DISCONTINUED] metoprolol (TOPROL-XL) 100 MG 24 hr tablet Take 100 mg by mouth 2 (two) times daily.         No current facility-administered medications for this visit.     Past Medical History  Diagnosis Date  . Heart attack 1994  . Hypertension   . CAD (coronary artery disease)     a. s/p CABG x1 with LIMA-LAD 1994. b. LM & 3v CAD by cath 07/2012, pending CABG.  . OSA on CPAP   . Hypoparathyroidism   . BPH (benign prostatic hyperplasia)   . Hyperlipidemia   . CHF (congestive heart failure)     a. EF 35-40% by echo 07/2012.  [name] NSVT (nonsustained ventricular tachycardia)     a. Seen on tele 07/2012.  . Pseudoaneurysm of left femoral artery     a. post cath s/p compression 07/2012, associated w/ anemia.  . Emphysema     a. Moderate emphysema by CT 06/2012.  [name]  Sciatica 2012    Qualifier: Diagnosis of  By: Linford Arnold MD, Santina Evans      Past Surgical History  Procedure Laterality Date  . Coronary artery bypass graft  03-23-92  . Cataract extraction, bilateral    . Coronary artery bypass graft N/A 08/14/2012    Procedure: REDO CORONARY ARTERY BYPASS GRAFTING (CABG);  Surgeon: Alleen Borne, MD;  Location: [facility] OR;  Service: Open Heart Surgery;  Laterality: N/A;  . Intraoperative transesophageal echocardiogram N/A 08/14/2012    Procedure: INTRAOPERATIVE TRANSESOPHAGEAL ECHOCARDIOGRAM;  Surgeon: Alleen Borne, MD;  Location: [facility] OR;  Service: Open Heart Surgery;  Laterality: N/A;    History   Social History  . Marital Status: Married    Spouse Name: N/A    Number of Children: N/A  . Years of Education: N/A   Occupational History  . Not on file.   Social History Main Topics  .  Smoking status: Former Smoker -- 1.00 packs/day for 60 years    Quit date: 10/08/2010  . Smokeless tobacco: Not on file  . Alcohol Use: Yes     Comment: Occasional  . Drug Use: No  . Sexual Activity: Not on file     Comment: works part-time, [employer] completed H, married, no children, regular exercise.S   Other Topics Concern  . Not on file   Social History Narrative  . No narrative on file    ROS: some pain in right neck and ear but no fevers or chills, productive cough, hemoptysis, dysphasia, odynophagia, melena, hematochezia, dysuria, hematuria, rash, seizure activity, orthopnea, PND, pedal edema, claudication. Remaining systems are negative.  Physical Exam: Well-developed well-nourished in no acute distress.  Skin is warm and dry.  HEENT is normal.  Neck is supple.  Chest is clear to auscultation with normal expansion.  Cardiovascular exam is regular rate and rhythm.  Abdominal exam nontender or distended. No masses palpated. Extremities show 1+ edema. neuro grossly intact

## 2012-11-07 NOTE — Patient Instructions (Signed)
Your physician wants you to follow-up in: 6 MONTHS WITH DR CRENSHAW You will receive a reminder letter in the mail two months in advance. If you don't receive a letter, please call our office to schedule the follow-up appointment.  

## 2012-11-07 NOTE — Assessment & Plan Note (Signed)
Continue aspirin and statin. 

## 2012-11-07 NOTE — Assessment & Plan Note (Signed)
Continue present dose of Lasix. 

## 2012-11-07 NOTE — Assessment & Plan Note (Signed)
Continue statin. 

## 2012-11-07 NOTE — Assessment & Plan Note (Signed)
Blood pressure controlled. Continue present medications. 

## 2012-11-15 ENCOUNTER — Telehealth: Payer: Self-pay | Admitting: *Deleted

## 2012-11-15 NOTE — Telephone Encounter (Signed)
Pt's wife called with questions. lvm for return call.Loralee Pacas [place]

## 2012-11-16 ENCOUNTER — Telehealth: Payer: Self-pay | Admitting: Family Medicine

## 2012-11-16 NOTE — Telephone Encounter (Signed)
Okay to send order to  discontinue oxygen therapy.

## 2012-11-16 NOTE — Telephone Encounter (Signed)
Patient's wife called and adv that he is not going to take prozac he feels fine and would like to have order put in to have Oxygen picked up by [facility]... If any questions please call. Thanks

## 2012-11-16 NOTE — Telephone Encounter (Signed)
Pt's wife informed.Arlan Birks [place]  

## 2012-11-20 ENCOUNTER — Other Ambulatory Visit: Payer: Self-pay | Admitting: *Deleted

## 2012-11-20 DIAGNOSIS — I5022 Chronic systolic (congestive) heart failure: Secondary | ICD-10-CM

## 2012-11-20 DIAGNOSIS — I25810 Atherosclerosis of coronary artery bypass graft(s) without angina pectoris: Secondary | ICD-10-CM

## 2012-11-20 DIAGNOSIS — I10 Essential (primary) hypertension: Secondary | ICD-10-CM

## 2012-11-20 DIAGNOSIS — I255 Ischemic cardiomyopathy: Secondary | ICD-10-CM

## 2012-11-20 DIAGNOSIS — E785 Hyperlipidemia, unspecified: Secondary | ICD-10-CM

## 2012-11-20 MED ORDER — FUROSEMIDE 40 MG PO TABS: 40.0000 mg | ORAL_TABLET | Freq: Every day | ORAL | Status: DC

## 2012-11-22 ENCOUNTER — Ambulatory Visit: Payer: Medicare PPO | Admitting: Family Medicine

## 2012-12-10 ENCOUNTER — Ambulatory Visit: Payer: Medicare PPO | Admitting: Family Medicine

## 2012-12-11 ENCOUNTER — Encounter: Payer: Self-pay | Admitting: Family Medicine

## 2012-12-11 ENCOUNTER — Ambulatory Visit (INDEPENDENT_AMBULATORY_CARE_PROVIDER_SITE_OTHER): Payer: Medicare PPO | Admitting: Family Medicine

## 2012-12-11 ENCOUNTER — Telehealth: Payer: Self-pay | Admitting: *Deleted

## 2012-12-11 VITALS — BP 139/80 | HR 69 | Wt 220.0 lb

## 2012-12-11 DIAGNOSIS — I25810 Atherosclerosis of coronary artery bypass graft(s) without angina pectoris: Secondary | ICD-10-CM

## 2012-12-11 DIAGNOSIS — I10 Essential (primary) hypertension: Secondary | ICD-10-CM

## 2012-12-11 DIAGNOSIS — F430 Acute stress reaction: Secondary | ICD-10-CM

## 2012-12-11 MED ORDER — AMBULATORY NON FORMULARY MEDICATION: Status: DC

## 2012-12-11 NOTE — Progress Notes (Signed)
  Subjective:    Patient ID: Joseph Berg, male    DOB: 1933-06-27, 79 y.o.   MRN: [id number]  HPI He has been doing cardiac rehab for total of 26 weeks.  Feeling well oveall all.  Sleeping well.  [facility] not p[facility] his [facility].  No CP recently. He is still sore in his right leg were vessels were removed.    Depression - he is feeeling much better emotionally.  Never filled the SSRI  medications.  Says has a friend who's son took similar med and killed himself so he didn't want to fill it. He feels he doesn't want to take a medication.   No more vomiting since I last saw him. Suspect was an acute viral illness.   HTN- Pt denies chest pain, SOB, dizziness, or heart palpitations.  Taking meds as directed w/o problems.  Denies medication side effects.  .  Review of Systems     Objective:   Physical Exam  Constitutional: He is oriented to person, place, and time. He appears well-developed and well-nourished.  HENT:  Head: Normocephalic and atraumatic.  Cardiovascular: Normal rate, regular rhythm and normal heart sounds.   Pulmonary/Chest: Effort normal and breath sounds normal.  Neurological: He is alert and oriented to person, place, and time.  Skin: Skin is warm and dry.  Chest incision is healing well.   Psychiatric: He has a normal mood and affect. His behavior is normal.          Assessment & Plan:  CAD, s/p bypass - Doing well. Enrolled in rehab and going regularly. His wife has been going with him as well. On ASA, BB, statin.  Keep f/u with cards.   Depression - resolved. Likely acute situational. Still monitor closely with recent heart disease.    HTN- well controlled.  F/U in 4-6 months.

## 2012-12-11 NOTE — Telephone Encounter (Signed)
Faxed order to [facility] to d/c o2.Loralee Pacas [place]

## 2012-12-17 ENCOUNTER — Telehealth: Payer: Self-pay | Admitting: Cardiology

## 2012-12-17 NOTE — Telephone Encounter (Signed)
Doubt cardiac related; fu primary care; may need GI eval Olga Millers

## 2012-12-17 NOTE — Telephone Encounter (Signed)
New Problem  Pt wife called- states that since the surgery pt has been sick on his stomach every two weeks. Went to Primary care who has instructed him to see cardiologist.. Please call back to assist.

## 2012-12-17 NOTE — Telephone Encounter (Signed)
Spoke with pt, Aware of dr Ludwig Clarks recommendations. He will call dr Linford Arnold about a GI doctor in [place].

## 2012-12-17 NOTE — Telephone Encounter (Signed)
Spoke with pt, he is having epiosdes of vomiting. It occurs about every two weeks and always after eating. His stomach is usually sore the next day. He has regular bowel movements. He talked with his PCP and she told him to call us, she does not know what is causing this. He states otherwise he feels great. Doing well in cardiac rehab. Will forward for dr [name] review

## 2012-12-18 ENCOUNTER — Telehealth: Payer: Self-pay | Admitting: Family Medicine

## 2012-12-18 DIAGNOSIS — R109 Unspecified abdominal pain: Secondary | ICD-10-CM

## 2012-12-18 NOTE — Telephone Encounter (Signed)
Pt informed.Joseph Berg [place]  

## 2012-12-18 NOTE — Telephone Encounter (Signed)
Referral placed.

## 2012-12-18 NOTE — Telephone Encounter (Signed)
Patient is still having stomach issues and wants ref to GI doctor.  Wife prefers [facility]

## 2013-05-22 ENCOUNTER — Other Ambulatory Visit: Payer: Self-pay | Admitting: Cardiology

## 2013-06-10 ENCOUNTER — Other Ambulatory Visit: Payer: Self-pay | Admitting: Family Medicine

## 2013-06-10 ENCOUNTER — Encounter: Payer: Self-pay | Admitting: Family Medicine

## 2013-06-10 ENCOUNTER — Ambulatory Visit (INDEPENDENT_AMBULATORY_CARE_PROVIDER_SITE_OTHER): Payer: Medicare PPO | Admitting: Family Medicine

## 2013-06-10 ENCOUNTER — Ambulatory Visit (INDEPENDENT_AMBULATORY_CARE_PROVIDER_SITE_OTHER): Payer: Medicare PPO

## 2013-06-10 VITALS — BP 137/81 | HR 68 | Wt 236.0 lb

## 2013-06-10 DIAGNOSIS — I509 Heart failure, unspecified: Secondary | ICD-10-CM

## 2013-06-10 DIAGNOSIS — I5022 Chronic systolic (congestive) heart failure: Secondary | ICD-10-CM

## 2013-06-10 DIAGNOSIS — Z125 Encounter for screening for malignant neoplasm of prostate: Secondary | ICD-10-CM

## 2013-06-10 DIAGNOSIS — E348 Other specified endocrine disorders: Secondary | ICD-10-CM

## 2013-06-10 DIAGNOSIS — I700 Atherosclerosis of aorta: Secondary | ICD-10-CM

## 2013-06-10 DIAGNOSIS — M5137 Other intervertebral disc degeneration, lumbosacral region: Secondary | ICD-10-CM

## 2013-06-10 DIAGNOSIS — M545 Low back pain, unspecified: Secondary | ICD-10-CM

## 2013-06-10 DIAGNOSIS — M5380 Other specified dorsopathies, site unspecified: Secondary | ICD-10-CM

## 2013-06-10 DIAGNOSIS — I25810 Atherosclerosis of coronary artery bypass graft(s) without angina pectoris: Secondary | ICD-10-CM

## 2013-06-10 DIAGNOSIS — N400 Enlarged prostate without lower urinary tract symptoms: Secondary | ICD-10-CM

## 2013-06-10 DIAGNOSIS — I739 Peripheral vascular disease, unspecified: Secondary | ICD-10-CM

## 2013-06-10 DIAGNOSIS — I10 Essential (primary) hypertension: Secondary | ICD-10-CM

## 2013-06-10 LAB — POCT GLYCOSYLATED HEMOGLOBIN (HGB A1C): Hemoglobin A1C: 5.6

## 2013-06-10 MED ORDER — AMBULATORY NON FORMULARY MEDICATION: Status: DC

## 2013-06-10 MED ORDER — TIZANIDINE HCL 4 MG PO CAPS: 4.0000 mg | ORAL_CAPSULE | Freq: Three times a day (TID) | ORAL | Status: DC | PRN

## 2013-06-10 MED ORDER — PREDNISONE 50 MG PO TABS: 50.0000 mg | ORAL_TABLET | Freq: Every day | ORAL | Status: DC

## 2013-06-10 NOTE — Progress Notes (Signed)
   Subjective:    Patient ID: Joseph Berg, male    DOB: 1933-03-02, 80 y.o.   MRN: [id number]  HPI Hypertension- Pt denies chest pain, SOB, dizziness, or heart palpitations.  Taking meds as directed w/o problems.  Denies medication side effects.  Home BPs well controlled.    IFG- No inc thirst or urination.   CAD- some mild chest pain over his scar area, shortness of breath or palpitations. Taking his medications without any side effects or problems. He has been using vitamin E on his scar.  Hyperlipidemia-taking statin without difficulty. Last cholesterol level was in July.  Low back pain x 1 month.  Bent over to get something out of the cabinet and when he stood back up the pain started. Pain has been radiating into the left leg.  Radiating past the both leg.  Has to sit and rest after walking for a periods. No real alleviating factors. His been trying to rest. He's been avoiding exercising because of the back pain. He would like to try a muscle relaxer possible.  Review of Systems     Objective:   Physical Exam  Constitutional: He is oriented to person, place, and time. He appears well-developed and well-nourished.  HENT:  Head: Normocephalic and atraumatic.  Cardiovascular: Normal rate, regular rhythm and normal heart sounds.   Pulmonary/Chest: Effort normal and breath sounds normal.  Neurological: He is alert and oriented to person, place, and time.  Skin: Skin is warm and dry.  Psychiatric: He has a normal mood and affect. His behavior is normal.          Assessment & Plan:  Hypertension-well-controlled. Continue current regimen. Follow up in 6 months.  IFG - A1c is well-controlled today. Recommend repeat in one year. We discussed the importance of watching portion sizes on carbohydrates intake and avoiding any type of concentrated smokers especially and beverages et Ronney Asters. Lab Results  Component Value Date   HGBA1C 5.6 06/10/2013     Obesity - related to work  on exercise and diet. He's gained 16 pounds since last November. He has joined silver sneakers but has only gone twice. He really has to work on weight control session with his heart disease. I feel like he is in denial somewhat about his food choices. He says he really doesn't eat that much.  CAD- status post CABG. Continue Crestor, losartan, aspirin. He is not on a beta blocker.  BPH- recheck PSA today.   Low back pain with bilateral radicular symptoms. It is unusual to be bilateral. Will get x-ray of lumbar spine since he 80 years old and has been persistent for one month. Recommend prednisone burst for 5 days. Also add muscle relaxer to take in the evenings as needed. If not improving then consider evaluation for possible spinal stenosis since his pain is bilateral.  Pain over his scar. He does have evidence of hypertrophic scarring. Recommend scarring cream like Mederma. We'll have to use for several months to improve cosmetic appearance. But I do not see any defect in the actual scar itself.

## 2013-06-11 LAB — BASIC METABOLIC PANEL WITH GFR
BUN: 16 mg/dL (ref 6–23)
CHLORIDE: 106 meq/L (ref 96–112)
CO2: 29 meq/L (ref 19–32)
Calcium: 9.8 mg/dL (ref 8.4–10.5)
Creat: 0.76 mg/dL (ref 0.50–1.35)
GFR, EST NON AFRICAN AMERICAN: 88 mL/min
GFR, Est African American: >89 mL/min
Glucose, Bld: 91 mg/dL (ref 70–99)
Potassium: 4.6 mEq/L (ref 3.5–5.3)
Sodium: 142 mEq/L (ref 135–145)

## 2013-06-11 LAB — PSA: PSA: 2.23 ng/mL (ref <=4.00)

## 2013-06-17 ENCOUNTER — Telehealth: Payer: Self-pay | Admitting: *Deleted

## 2013-06-17 NOTE — Telephone Encounter (Signed)
Pt misunderstood and is still ok with having the US done. Please call him with appt time and date.Joseph Berg'

## 2013-06-24 ENCOUNTER — Encounter: Payer: Self-pay | Admitting: Family Medicine

## 2013-06-24 DIAGNOSIS — I714 Abdominal aortic aneurysm, without rupture, unspecified: Secondary | ICD-10-CM | POA: Insufficient documentation

## 2013-06-25 ENCOUNTER — Telehealth: Payer: Self-pay | Admitting: Cardiology

## 2013-06-25 NOTE — Telephone Encounter (Signed)
Rec'd from [facility] forward 2 pages to Dr. Stanford Breed

## 2013-06-26 ENCOUNTER — Encounter: Payer: Self-pay | Admitting: Cardiology

## 2013-06-26 ENCOUNTER — Ambulatory Visit (INDEPENDENT_AMBULATORY_CARE_PROVIDER_SITE_OTHER): Payer: Medicare PPO | Admitting: Cardiology

## 2013-06-26 ENCOUNTER — Encounter: Payer: Self-pay | Admitting: *Deleted

## 2013-06-26 VITALS — BP 122/80 | HR 70 | Ht 69.0 in | Wt 230.0 lb

## 2013-06-26 DIAGNOSIS — I714 Abdominal aortic aneurysm, without rupture, unspecified: Secondary | ICD-10-CM

## 2013-06-26 DIAGNOSIS — I2589 Other forms of chronic ischemic heart disease: Secondary | ICD-10-CM

## 2013-06-26 DIAGNOSIS — I255 Ischemic cardiomyopathy: Secondary | ICD-10-CM

## 2013-06-26 DIAGNOSIS — I679 Cerebrovascular disease, unspecified: Secondary | ICD-10-CM | POA: Insufficient documentation

## 2013-06-26 DIAGNOSIS — I25810 Atherosclerosis of coronary artery bypass graft(s) without angina pectoris: Secondary | ICD-10-CM

## 2013-06-26 NOTE — Assessment & Plan Note (Signed)
Patient had recent abdominal ultrasound to exclude aneurysm. Primary care ordered this and we will await results.

## 2013-06-26 NOTE — Assessment & Plan Note (Signed)
Continue beta blocker and ARB. Repeat echocardiogram to see if LV function has improved following revascularization.

## 2013-06-26 NOTE — Patient Instructions (Signed)
Your physician wants you to follow-up in: [place] will receive a reminder letter in the mail two months in advance. If you don't receive a letter, please call our office to schedule the follow-up appointment.   Your physician has requested that you have an echocardiogram. Echocardiography is a painless test that uses sound waves to create images of your heart. It provides your doctor with information about the size and shape of your heart and how well your heart's chambers and valves are working. This procedure takes approximately one hour. There are no restrictions for this procedure.   Your physician has requested that you have a carotid duplex. This test is an ultrasound of the carotid arteries in your neck. It looks at blood flow through these arteries that supply the brain with blood. Allow one hour for this exam. There are no restrictions or special instructions.

## 2013-06-26 NOTE — Assessment & Plan Note (Signed)
Blood pressure controlled. Continue present medications. 

## 2013-06-26 NOTE — Telephone Encounter (Signed)
Called and lvm informing pt that results are not back and we will call once results are received.Joseph Berg

## 2013-06-26 NOTE — Assessment & Plan Note (Signed)
Continue aspirin and statin. Followup carotid Dopplers in July 2015.

## 2013-06-26 NOTE — Progress Notes (Signed)
HPI: FU CHF and CAD. Patient had a myocardial infarction followed by coronary artery bypassing graft in 1994 in [place]. He had no cardiology followup since that time until I saw him in June of 2014 for CHF. Echo in June of 2014 showed an ejection fraction of 35-40%. There was mild left ventricular hypertrophy. Right ventricular function was mildly reduced. Cardiac catheterization in June of 2014 showed a 90% left main, occluded LAD, normal ramus, occluded circumflex and occluded right coronary artery. The LIMA to the LAD was patent. Ejection fraction was 35-40%. The patient underwent redo coronary artery bypass and graft in July of 2014 with a saphenous vein graft to the ramus and a saphenous vein graft to the PDA. Note pre-CABG Dopplers showed 40-59% bilateral stenosis. I last saw him in Oct of 2014. Since then, the patient denies any dyspnea on exertion, orthopnea, PND, pedal edema, palpitations, syncope or chest pain.   Current Outpatient Prescriptions  Medication Sig Dispense Refill  . acetaminophen (TYLENOL) 500 MG tablet Take 1-2 tablets (500-1,000 mg total) by mouth every 6 (six) hours as needed for pain.  30 tablet  0  . AMBULATORY NON FORMULARY MEDICATION Medication Name: shingles vaccine IM x 1  1 vial  0  . Ascorbic Acid (VITAMIN C) 1000 MG tablet Take 1,000 mg by mouth daily.      [name] aspirin EC 325 MG EC tablet Take 1 tablet (325 mg total) by mouth daily.  30 tablet  0  . carvedilol (COREG) 12.5 MG tablet Take 1 tablet (12.5 mg total) by mouth 2 (two) times daily.  180 tablet  3  . cholecalciferol (VITAMIN D) 1000 UNITS tablet Take 1,000 Units by mouth daily.      . Cyanocobalamin (VITAMIN B-12 PO) Take 1 tablet by mouth daily.      . finasteride (PROSCAR) 5 MG tablet Take 5 mg by mouth daily.       . furosemide (LASIX) 40 MG tablet TAKE ONE TABLET BY MOUTH ONE TIME DAILY   90 tablet  0  . losartan (COZAAR) 50 MG tablet Take 1 tablet (50 mg total) by mouth daily.  30 tablet   12  . Multiple Vitamin (MULTIVITAMIN) capsule Take 1 capsule by mouth daily.      . rosuvastatin (CRESTOR) 40 MG tablet Take 1 tablet (40 mg total) by mouth daily.  30 tablet  12  . tiZANidine (ZANAFLEX) 4 MG capsule Take 1 capsule (4 mg total) by mouth 3 (three) times daily as needed for muscle spasms.  45 capsule  0  . [DISCONTINUED] metoprolol (TOPROL-XL) 100 MG 24 hr tablet Take 100 mg by mouth 2 (two) times daily.         No current facility-administered medications for this visit.     Past Medical History  Diagnosis Date  . Heart attack 1994  . Hypertension   . CAD (coronary artery disease)     a. s/p CABG x1 with LIMA-LAD 1994. b. LM & 3v CAD by cath 07/2012, pending CABG.  . OSA on CPAP   . Hypoparathyroidism   . BPH (benign prostatic hyperplasia)   . Hyperlipidemia   . CHF (congestive heart failure)     a. EF 35-40% by echo 07/2012.  [name] NSVT (nonsustained ventricular tachycardia)     a. Seen on tele 07/2012.  . Pseudoaneurysm of left femoral artery     a. post cath s/p compression 07/2012, associated w/ anemia.  . Emphysema  a. Moderate emphysema by CT 06/2012.  . Sciatica 2012    Qualifier: Diagnosis of  By: Madilyn Fireman MD, Barnetta Chapel      Past Surgical History  Procedure Laterality Date  . Coronary artery bypass graft  03-23-92  . Cataract extraction, bilateral    . Coronary artery bypass graft N/A 08/14/2012    Procedure: REDO CORONARY ARTERY BYPASS GRAFTING (CABG);  Surgeon: Gaye Pollack, MD;  Location: [facility];  Service: Open Heart Surgery;  Laterality: N/A;  . Intraoperative transesophageal echocardiogram N/A 08/14/2012    Procedure: INTRAOPERATIVE TRANSESOPHAGEAL ECHOCARDIOGRAM;  Surgeon: Gaye Pollack, MD;  Location: [facility];  Service: Open Heart Surgery;  Laterality: N/A;    History   Social History  . Marital Status: Married    Spouse Name: N/A    Number of Children: N/A  . Years of Education: N/A   Occupational History  . Not on file.   Social History Main  Topics  . Smoking status: Former Smoker -- 1.00 packs/day for 60 years    Quit date: 10/08/2010  . Smokeless tobacco: Not on file  . Alcohol Use: Yes     Comment: Occasional  . Drug Use: No  . 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 Not on file   Social History Narrative  . No narrative on file    ROS: Back pain but no fevers or chills, productive cough, hemoptysis, dysphasia, odynophagia, melena, hematochezia, dysuria, hematuria, rash, seizure activity, orthopnea, PND, pedal edema, claudication. Remaining systems are negative.  Physical Exam: Well-developed well-nourished in no acute distress.  Skin is warm and dry.  HEENT is normal.  Neck is supple.  Chest is clear to auscultation with normal expansion.  Cardiovascular exam is regular rate and rhythm.  Abdominal exam nontender or distended. No masses palpated. Extremities show no edema. neuro grossly intact  ECG Sinus rhythm at a rate of 70. Right bundle branch block.

## 2013-06-26 NOTE — Assessment & Plan Note (Signed)
Euvolemic on examination.Continue present dose of Lasix. 

## 2013-06-26 NOTE — Assessment & Plan Note (Signed)
Continue statin. 

## 2013-06-26 NOTE — Assessment & Plan Note (Signed)
Continue aspirin and statin. 

## 2013-07-05 ENCOUNTER — Telehealth: Payer: Self-pay | Admitting: *Deleted

## 2013-07-05 NOTE — Telephone Encounter (Signed)
Pt's wife called and stated that they have not heard back about his results and it has been over 3 weeks. He is still having back pain. She wanted to know if he can go to the chiropractor. Called and informed that I have sent his information to Dr. Madilyn Fireman for review. Results copied from care everywhere.Joseph Berg   [id number]   Attending MD:  Beatrice Lecher [phone]   Ordering MD:  Beatrice Lecher Date of Birth:  06/27/1933    Sex: M Admit Date:  06/21/2013 10:20    ###FINAL RESULT###      INDICATIONS: CALCIFICATION OF THE AORTA WITH SLIGHT PROMINENCE NOTED IN  X-RAY COMMENTS:       PROCEDURE:  QUS 2039- [place] IVC, ILIACS PELVIC GR - Jun 21 2013    [employer] Accession #: [id number] [employer] Accession #: [id number]     TECHNIQUE: Real-time multiplanar grayscale and color Doppler ultrasound  of the abdominal aorta, IVC, and iliac arteries was performed  COMPARISON: None  INDICATION: CALCIFICATION OF THE AORTA WITH SLIGHT PROMINENCE NOTED IN  X-RAY  FINDINGS:  Peak systolic velocity in the abdominal aorta is 64 cm/sec. The proximal aorta measures 3.3 x 3.1 cm The midsegment aorta measures 3.5 x 3.5 cm The distal aorta measures 2.4 x 2.4 cm The right common iliac artery measures 1.8 x 1.1 cm The left common iliac artery measures 1.9 x 1.2 cm The IVC is normal where seen The right kidney measures 11.3 cm and the left kidney measures 11.0 cm.  No hydronephrosis. Bilateral renal cysts, largest in the left kidney  measuring 5 cm.      IMPRESSION: 3.5 cm AAA. Small bilateral common iliac artery aneurysms. Bilateral renal cysts.         ___________________________________________________________ Current Report Read byEpimenio Foot on Jun 21 2013  1:58P Transcribed byJari Pigg   on Jun 21 2013  2:00P Electronically Signed by:  DR. Carrington Clamp on:  Jun 21 2013  2:00P   Status

## 2013-07-08 NOTE — Telephone Encounter (Signed)
lvm with results and recommendations.Joseph Berg

## 2013-07-08 NOTE — Telephone Encounter (Signed)
Call pt: Echo shows thathe does have an abdomina aortic aneurysm that is 3.5 cm in size. This is small. Recommend repeat US in 2 years based on [employer]. Also Ok to see chiropractor for his back if he would like.

## 2013-07-09 ENCOUNTER — Encounter: Payer: Self-pay | Admitting: Adult Health

## 2013-07-21 ENCOUNTER — Other Ambulatory Visit: Payer: Self-pay | Admitting: Family Medicine

## 2013-08-05 ENCOUNTER — Ambulatory Visit (HOSPITAL_BASED_OUTPATIENT_CLINIC_OR_DEPARTMENT_OTHER): Payer: Medicare PPO | Admitting: Cardiology

## 2013-08-05 ENCOUNTER — Ambulatory Visit (HOSPITAL_COMMUNITY): Payer: Medicare PPO | Attending: Cardiology | Admitting: Radiology

## 2013-08-05 ENCOUNTER — Other Ambulatory Visit: Payer: Self-pay | Admitting: Cardiology

## 2013-08-05 DIAGNOSIS — I252 Old myocardial infarction: Secondary | ICD-10-CM | POA: Insufficient documentation

## 2013-08-05 DIAGNOSIS — I498 Other specified cardiac arrhythmias: Secondary | ICD-10-CM | POA: Insufficient documentation

## 2013-08-05 DIAGNOSIS — I255 Ischemic cardiomyopathy: Secondary | ICD-10-CM

## 2013-08-05 DIAGNOSIS — E785 Hyperlipidemia, unspecified: Secondary | ICD-10-CM | POA: Insufficient documentation

## 2013-08-05 DIAGNOSIS — I679 Cerebrovascular disease, unspecified: Secondary | ICD-10-CM

## 2013-08-05 DIAGNOSIS — E669 Obesity, unspecified: Secondary | ICD-10-CM | POA: Insufficient documentation

## 2013-08-05 DIAGNOSIS — G4733 Obstructive sleep apnea (adult) (pediatric): Secondary | ICD-10-CM | POA: Insufficient documentation

## 2013-08-05 DIAGNOSIS — Z87891 Personal history of nicotine dependence: Secondary | ICD-10-CM | POA: Insufficient documentation

## 2013-08-05 DIAGNOSIS — J438 Other emphysema: Secondary | ICD-10-CM | POA: Insufficient documentation

## 2013-08-05 DIAGNOSIS — I2589 Other forms of chronic ischemic heart disease: Secondary | ICD-10-CM

## 2013-08-05 NOTE — Progress Notes (Signed)
Carotid duplex performed 

## 2013-08-05 NOTE — Progress Notes (Signed)
Echocardiogram performed.  

## 2013-08-21 ENCOUNTER — Telehealth: Payer: Self-pay | Admitting: *Deleted

## 2013-08-21 NOTE — Telephone Encounter (Signed)
Left message for pt, US duplex form [facility] shows: 3.5 cm AAA Small bil common iliac artery aneurysms. bil renal cysts.  Per dr Stanford Breed the pt will need follow up in on year.

## 2013-09-08 ENCOUNTER — Other Ambulatory Visit: Payer: Self-pay | Admitting: Cardiology

## 2013-09-16 ENCOUNTER — Telehealth: Payer: Self-pay | Admitting: Family Medicine

## 2013-09-16 ENCOUNTER — Encounter: Payer: Self-pay | Admitting: Family Medicine

## 2013-09-16 ENCOUNTER — Ambulatory Visit (INDEPENDENT_AMBULATORY_CARE_PROVIDER_SITE_OTHER): Payer: Medicare PPO | Admitting: Family Medicine

## 2013-09-16 VITALS — BP 130/74 | HR 70 | Wt 230.0 lb

## 2013-09-16 DIAGNOSIS — I714 Abdominal aortic aneurysm, without rupture, unspecified: Secondary | ICD-10-CM

## 2013-09-16 DIAGNOSIS — I10 Essential (primary) hypertension: Secondary | ICD-10-CM

## 2013-09-16 DIAGNOSIS — M545 Low back pain, unspecified: Secondary | ICD-10-CM

## 2013-09-16 DIAGNOSIS — I700 Atherosclerosis of aorta: Secondary | ICD-10-CM

## 2013-09-16 MED ORDER — METAXALONE 800 MG PO TABS: 800.0000 mg | ORAL_TABLET | Freq: Three times a day (TID) | ORAL | Status: DC | PRN

## 2013-09-16 NOTE — Progress Notes (Signed)
   Subjective:    Patient ID: Joseph Berg, male    DOB: [birth year], 80 y.o.   MRN: [id number]  HPI F/U low back back.  - says the steorids didn't really help. The tizanidine was too sedating. Joseph Berg says it made him fall asleep. Would like to see his chiroprator. Joseph Berg would like tohave copy of his x-ray films. Dr. Lane Hacker on [address].    Followup aortic aneurysm. Joseph Berg did have an ultrasound of the aorta performed which does show a 3.5 cm aortic aneurysm. Joseph Berg and his wife both have some questions about this today.  Hypertension- Pt denies chest pain, SOB, dizziness, or heart palpitations.  Taking meds as directed w/o problems.  Denies medication side effects.     Review of Systems     Objective:   Physical Exam  Constitutional: Joseph Berg is oriented to person, place, and time. Joseph Berg appears well-developed and well-nourished.  HENT:  Head: Normocephalic and atraumatic.  Cardiovascular: Normal rate, regular rhythm and normal heart sounds.   Pulmonary/Chest: Effort normal and breath sounds normal.  Neurological: Joseph Berg is alert and oriented to person, place, and time.  Skin: Skin is warm and dry.  Psychiatric: Joseph Berg has a normal mood and affect. His behavior is normal.          Assessment & Plan:  Low back pain secondary to degenerative disc disease-gave him a copy of the x-ray report and encouraged him to go get the actual films put on a desk for his chiropractor. If Joseph Berg would like more traditional medicine would recommend referral to Dr. Dianah Field, or sports medicine physician for further evaluation. I think Joseph Berg would be a good candidate for physical therapy at least initially. We will also try a less sedating muscle relaxer, Skelaxin. Avoid NSAIDs for now.  Aortic aneurysm 3.5 cm.- We discussed the importance of controlling blood pressure, avoiding smoking, taking his statin, and being followed at regular intervals. At this point the risk of surgery is too high to recommend correction and we  discussed this today. Recommend repeat US in one year  HTN well controlled.

## 2013-09-16 NOTE — Telephone Encounter (Signed)
Please call patient and let him know that we will need to repeat the ultrasound of the abdominal aortic aneurysm in one year, in May of 2016.

## 2013-09-16 NOTE — Telephone Encounter (Signed)
lvm w/recommendations.Joseph Berg [place]

## 2013-09-30 ENCOUNTER — Telehealth: Payer: Self-pay

## 2013-09-30 NOTE — Telephone Encounter (Signed)
Left a message for patient to return call. [employer] denied PA for Skelaxin. Dr Madilyn Fireman advised to go on tizanidine.

## 2013-10-02 ENCOUNTER — Other Ambulatory Visit: Payer: Self-pay | Admitting: Cardiology

## 2013-11-20 ENCOUNTER — Other Ambulatory Visit: Payer: Self-pay | Admitting: Family Medicine

## 2013-12-13 ENCOUNTER — Encounter: Payer: Self-pay | Admitting: *Deleted

## 2013-12-13 ENCOUNTER — Emergency Department (INDEPENDENT_AMBULATORY_CARE_PROVIDER_SITE_OTHER): Payer: Medicare PPO

## 2013-12-13 ENCOUNTER — Emergency Department
Admission: EM | Admit: 2013-12-13 | Discharge: 2013-12-13 | Disposition: A | Discharge status: Home / Self Care | Payer: Medicare PPO | Source: Home / Self Care | Attending: Family Medicine | Admitting: Family Medicine

## 2013-12-13 DIAGNOSIS — J189 Pneumonia, unspecified organism: Secondary | ICD-10-CM

## 2013-12-13 DIAGNOSIS — R05 Cough: Secondary | ICD-10-CM

## 2013-12-13 DIAGNOSIS — R059 Cough, unspecified: Secondary | ICD-10-CM

## 2013-12-13 DIAGNOSIS — R0602 Shortness of breath: Secondary | ICD-10-CM

## 2013-12-13 MED ORDER — IPRATROPIUM-ALBUTEROL 0.5-2.5 (3) MG/3ML IN SOLN
3.0000 mL | Freq: Once | RESPIRATORY_TRACT | Status: AC
  Administered 2013-12-13: 3 mL via RESPIRATORY_TRACT

## 2013-12-13 MED ORDER — LEVOFLOXACIN 500 MG PO TABS: 500.0000 mg | ORAL_TABLET | Freq: Every day | ORAL | Status: DC

## 2013-12-13 MED ORDER — GUAIFENESIN-CODEINE 100-10 MG/5ML PO SOLN: 5.0000 mL | Freq: Every evening | ORAL | Status: DC | PRN

## 2013-12-13 NOTE — Discharge Instructions (Signed)
Thank you for coming in today. Take Levaquin daily for 7 days Use codeine containing cough medication as needed Emergency room if you get worse Follow-up with your primary care doctor Call or go to the emergency room if you get worse, have trouble breathing, have chest pains, or palpitations.   Pneumonia Pneumonia is an infection of the lungs.  CAUSES Pneumonia may be caused by bacteria or a virus. Usually, these infections are caused by breathing infectious particles into the lungs (respiratory tract). SIGNS AND SYMPTOMS   Cough.  Fever.  Chest pain.  Increased rate of breathing.  Wheezing.  Mucus production. DIAGNOSIS  If you have the common symptoms of pneumonia, your health care provider will typically confirm the diagnosis with a chest X-ray. The X-ray will show an abnormality in the lung (pulmonary infiltrate) if you have pneumonia. Other tests of your blood, urine, or sputum may be done to find the specific cause of your pneumonia. Your health care provider may also do tests (blood gases or pulse oximetry) to see how well your lungs are working. TREATMENT  Some forms of pneumonia may be spread to other people when you cough or sneeze. You may be asked to wear a mask before and during your exam. Pneumonia that is caused by bacteria is treated with antibiotic medicine. Pneumonia that is caused by the influenza virus may be treated with an antiviral medicine. Most other viral infections must run their course. These infections will not respond to antibiotics.  HOME CARE INSTRUCTIONS   Cough suppressants may be used if you are losing too much rest. However, coughing protects you by clearing your lungs. You should avoid using cough suppressants if you can.  Your health care provider may have prescribed medicine if he or she thinks your pneumonia is caused by bacteria or influenza. Finish your medicine even if you start to feel better.  Your health care provider may also prescribe  an expectorant. This loosens the mucus to be coughed up.  Take medicines only as directed by your health care provider.  Do not smoke. Smoking is a common cause of bronchitis and can contribute to pneumonia. If you are a smoker and continue to smoke, your cough may last several weeks after your pneumonia has cleared.  A cold steam vaporizer or humidifier in your room or home may help loosen mucus.  Coughing is often worse at night. Sleeping in a semi-upright position in a recliner or using a couple pillows under your head will help with this.  Get rest as you feel it is needed. Your body will usually let you know when you need to rest. PREVENTION A pneumococcal shot (vaccine) is available to prevent a common bacterial cause of pneumonia. This is usually suggested for:  People over 19 years old.  Patients on chemotherapy.  People with chronic lung problems, such as bronchitis or emphysema.  People with immune system problems. If you are over 65 or have a high risk condition, you may receive the pneumococcal vaccine if you have not received it before. In some countries, a routine influenza vaccine is also recommended. This vaccine can help prevent some cases of pneumonia.You may be offered the influenza vaccine as part of your care. If you smoke, it is time to quit. You may receive instructions on how to stop smoking. Your health care provider can provide medicines and counseling to help you quit. SEEK MEDICAL CARE IF: You have a fever. SEEK IMMEDIATE MEDICAL CARE IF:   Your illness  becomes worse. This is especially true if you are elderly or weakened from any other disease.  You cannot control your cough with suppressants and are losing sleep.  You begin coughing up blood.  You develop pain which is getting worse or is uncontrolled with medicines.  Any of the symptoms which initially brought you in for treatment are getting worse rather than better.  You develop shortness of  breath or chest pain. MAKE SURE YOU:   Understand these instructions.  Will watch your condition.  Will get help right away if you are not doing well or get worse. Document Released: 01/24/2005 Document Revised: 06/10/2013 Document Reviewed: 04/15/2010 [facility] Patient Information 2015 [place], [place]. This information is not intended to replace advice given to you by your health care provider. Make sure you discuss any questions you have with your health care provider.

## 2013-12-13 NOTE — ED Notes (Signed)
Pt c/o productive cough, SOB, eyes blurry, and runny nose x 2 days. Denies fever.

## 2013-12-13 NOTE — ED Provider Notes (Signed)
Joseph Berg is a 80 y.o. male who presents to Urgent Care today for Cough congestion mild shortness of breath. Cough is productive. No chest pains or palpitations. No abdominal pain. Patient has tried some over-the-counter medications which did not help much. No leg swelling.   Past Medical History  Diagnosis Date  . Heart attack 1994  . Hypertension   . CAD (coronary artery disease)     a. s/p CABG x1 with LIMA-LAD 1994. b. LM & 3v CAD by cath 07/2012, pending CABG.  . OSA on CPAP   . Hypoparathyroidism   . BPH (benign prostatic hyperplasia)   . Hyperlipidemia   . CHF (congestive heart failure)     a. EF 35-40% by echo 07/2012.  [name] NSVT (nonsustained ventricular tachycardia)     a. Seen on tele 07/2012.  . Pseudoaneurysm of left femoral artery     a. post cath s/p compression 07/2012, associated w/ anemia.  . Emphysema     a. Moderate emphysema by CT 06/2012.  . Sciatica 2012    Qualifier: Diagnosis of  By: Madilyn Fireman MD, Barnetta Chapel     Past Surgical History  Procedure Laterality Date  . Coronary artery bypass graft  03-23-92  . Cataract extraction, bilateral    . Coronary artery bypass graft N/A 08/14/2012    Procedure: REDO CORONARY ARTERY BYPASS GRAFTING (CABG);  Surgeon: Gaye Pollack, MD;  Location: [facility];  Service: Open Heart Surgery;  Laterality: N/A;  . Intraoperative transesophageal echocardiogram N/A 08/14/2012    Procedure: INTRAOPERATIVE TRANSESOPHAGEAL ECHOCARDIOGRAM;  Surgeon: Gaye Pollack, MD;  Location: [facility] OR;  Service: Open Heart Surgery;  Laterality: N/A;   History  Substance Use Topics  . Smoking status: Former Smoker -- 1.00 packs/day for 60 years    Quit date: 10/08/2010  . Smokeless tobacco: Not on file  . Alcohol Use: Yes     Comment: Occasional   ROS as above Medications: No current facility-administered medications for this encounter.   Current Outpatient Prescriptions  Medication Sig Dispense Refill  . acetaminophen (TYLENOL) 500 MG tablet Take  1-2 tablets (500-1,000 mg total) by mouth every 6 (six) hours as needed for pain. 30 tablet 0  . AMBULATORY NON FORMULARY MEDICATION Medication Name: shingles vaccine IM x 1 1 vial 0  . Ascorbic Acid (VITAMIN C) 1000 MG tablet Take 1,000 mg by mouth daily.    [name] aspirin EC 325 MG EC tablet Take 1 tablet (325 mg total) by mouth daily. 30 tablet 0  . carvedilol (COREG) 12.5 MG tablet TAKE ONE TABLET BY MOUTH TWICE DAILY  180 tablet 2  . cholecalciferol (VITAMIN D) 1000 UNITS tablet Take 1,000 Units by mouth daily.    . CRESTOR 40 MG tablet TAKE ONE TABLET BY MOUTH ONE TIME DAILY  30 tablet 10  . Cyanocobalamin (VITAMIN B-12 PO) Take 1 tablet by mouth daily.    . finasteride (PROSCAR) 5 MG tablet TAKE ONE TABLET BY MOUTH ONE TIME DAILY  30 tablet 2  . furosemide (LASIX) 40 MG tablet TAKE ONE TABLET BY MOUTH ONE TIME DAILY  90 tablet 3  . guaiFENesin-codeine 100-10 MG/5ML syrup Take 5 mLs by mouth at bedtime as needed for cough. 120 mL 0  . levofloxacin (LEVAQUIN) 500 MG tablet Take 1 tablet (500 mg total) by mouth daily. 7 tablet 0  . losartan (COZAAR) 50 MG tablet TAKE ONE TABLET BY MOUTH ONE TIME DAILY  30 tablet 11  . metaxalone (SKELAXIN) 800 MG tablet Take  1 tablet (800 mg total) by mouth 3 (three) times daily as needed for muscle spasms. 60 tablet 0  . Multiple Vitamin (MULTIVITAMIN) capsule Take 1 capsule by mouth daily.    . [DISCONTINUED] metoprolol (TOPROL-XL) 100 MG 24 hr tablet Take 100 mg by mouth 2 (two) times daily.       Allergies  Allergen Reactions  . Hctz [Hydrochlorothiazide] Other (See Comments)    Affected renal function.      Exam:  BP 126/74 mmHg  Pulse 80  Temp(Src) 98.9 F (37.2 C) (Oral)  Resp 18  Ht 5\' 9"  (1.753 m)  Wt 230 lb (104.327 kg)  BMI 33.95 kg/m2  SpO2 93% Gen: Well NAD HEENT: EOMI,  MMM normal posterior pharynx and tympanic membranes Lungs: Normal work of breathing. Rales present bilateral lungs lower lobes Heart: RRR no MRG Abd: NABS, Soft.  Nondistended, Nontender Exts: Brisk capillary refill, warm and well perfused. nonedematous bilateral lower extremities.  Patient was given a 2.5/0.5 mg DuoNeb nebulizer treatment and felt no better.  No results found for this or any previous visit (from the past 24 hour(s)). Dg Chest 2 View  12/13/2013   CLINICAL DATA:  Cough, congestion x5 days, shortness of breath  EXAM: CHEST  2 VIEW  COMPARISON:  09/05/2012  FINDINGS: Mild patchy opacities in the bilateral lower lobes, possibly chronic reticulation/fibrosis, but progressed from 2014. Superimposed bilateral lower lobe pneumonia is not excluded.  No pleural effusion or pneumothorax.  The heart is top-normal in size. Postsurgical changes related to prior CABG.  Degenerative changes of the visualized thoracolumbar spine.  IMPRESSION: Mild patchy bilateral lower lobe opacities, possibly chronic, but progressed from 2014.  Superimposed bilateral lower lobe pneumonia is not excluded.   Electronically Signed   By: Julian Hy M.D.   On: 12/13/2013 15:25    Assessment and Plan: 80 y.o. male with pneumonia. Treatment with Levaquin and codeine containing cough medication. Follow-up with PCP.  Discussed warning signs or symptoms. Please see discharge instructions. Patient expresses understanding.     Gregor Hams, MD 12/13/13 [phone]

## 2013-12-15 ENCOUNTER — Telehealth: Payer: Self-pay

## 2013-12-19 ENCOUNTER — Ambulatory Visit (INDEPENDENT_AMBULATORY_CARE_PROVIDER_SITE_OTHER): Payer: Medicare PPO | Admitting: Family Medicine

## 2013-12-19 ENCOUNTER — Encounter: Payer: Self-pay | Admitting: Family Medicine

## 2013-12-19 VITALS — BP 130/79 | HR 74 | Ht 69.0 in | Wt 230.0 lb

## 2013-12-19 DIAGNOSIS — J189 Pneumonia, unspecified organism: Secondary | ICD-10-CM

## 2013-12-19 NOTE — Progress Notes (Signed)
   Subjective:    Patient ID: Joseph Berg, male    DOB: 1934-01-15, 80 y.o.   MRN: [id number]  HPI Here for follow-up pneumonia. He was seen 7 days ago in urgent care for bilateral lower lobe pneumonia. He had significant cough with yellow productive sputum and was very short of breath.he just had repeat CABG procedure done in July 2014. He sees cardiology regularly.   Review of Systems     Objective:   Physical Exam  Constitutional: He is oriented to person, place, and time. He appears well-developed and well-nourished.  HENT:  Head: Normocephalic and atraumatic.  Cardiovascular: Normal rate, regular rhythm and normal heart sounds.   Pulmonary/Chest: Effort normal and breath sounds normal.  Neurological: He is alert and oriented to person, place, and time.  Skin: Skin is warm and dry.  Psychiatric: He has a normal mood and affect. His behavior is normal.          Assessment & Plan:  Bilateral lower lobe pneumonia-I did review his chest x-ray results. Looks like he has some underlying scar tissue at the bases bilaterally. He says his last breathing test was several years ago. There was an increase in that area. Consistent with either pneumonia or just increase in fibrosis. Thus I would like to repeat a chest x-ray in one month to see if the area has improved. He also needs to be scheduled for spirometry.we also discussed he can show that he has the Prevnar 13 based on new guidelines for pneumonia vaccination for adults over 65. He says he will do it at his follow-up visit in December. He wants to get over his acute illness completely before getting the new vaccine. Continue to increase activities gradually. Hydrating well. And call if he feels like he is getting worse.

## 2013-12-20 LAB — LIPID PANEL
CHOL/HDL RATIO: 3.2 ratio
Cholesterol: 95 mg/dL (ref 0–200)
HDL: 30 mg/dL — AB (ref >39)
LDL CALC: 25 mg/dL (ref 0–99)
Triglycerides: 198 mg/dL — ABNORMAL HIGH (ref <150)
VLDL: 40 mg/dL (ref 0–40)

## 2013-12-20 LAB — COMPLETE METABOLIC PANEL WITH GFR
ALBUMIN: 3.9 g/dL (ref 3.5–5.2)
ALT: 24 U/L (ref 0–53)
AST: 24 U/L (ref 0–37)
Alkaline Phosphatase: 50 U/L (ref 39–117)
BUN: 18 mg/dL (ref 6–23)
CO2: 29 meq/L (ref 19–32)
Calcium: 10.1 mg/dL (ref 8.4–10.5)
Chloride: 100 mEq/L (ref 96–112)
Creat: 0.89 mg/dL (ref 0.50–1.35)
GFR, Est Non African American: 82 mL/min
GLUCOSE: 113 mg/dL — AB (ref 70–99)
POTASSIUM: 4.6 meq/L (ref 3.5–5.3)
SODIUM: 140 meq/L (ref 135–145)
TOTAL PROTEIN: 6.5 g/dL (ref 6.0–8.3)
Total Bilirubin: 0.6 mg/dL (ref 0.2–1.2)

## 2014-01-16 ENCOUNTER — Encounter: Payer: Self-pay | Admitting: Family Medicine

## 2014-01-16 ENCOUNTER — Ambulatory Visit (INDEPENDENT_AMBULATORY_CARE_PROVIDER_SITE_OTHER): Payer: Medicare PPO | Admitting: Family Medicine

## 2014-01-16 VITALS — BP 115/68 | HR 71 | Temp 98.4°F | Ht 69.0 in | Wt 230.0 lb

## 2014-01-16 DIAGNOSIS — Z23 Encounter for immunization: Secondary | ICD-10-CM

## 2014-01-16 DIAGNOSIS — J984 Other disorders of lung: Secondary | ICD-10-CM

## 2014-01-16 DIAGNOSIS — M5432 Sciatica, left side: Secondary | ICD-10-CM

## 2014-01-16 DIAGNOSIS — J439 Emphysema, unspecified: Principal | ICD-10-CM

## 2014-01-16 DIAGNOSIS — Z87891 Personal history of nicotine dependence: Secondary | ICD-10-CM

## 2014-01-16 DIAGNOSIS — J449 Chronic obstructive pulmonary disease, unspecified: Secondary | ICD-10-CM

## 2014-01-16 MED ORDER — ALBUTEROL SULFATE (2.5 MG/3ML) 0.083% IN NEBU: 2.5000 mg | INHALATION_SOLUTION | Freq: Once | RESPIRATORY_TRACT | Status: DC

## 2014-01-16 MED ORDER — BUDESONIDE-FORMOTEROL FUMARATE 160-4.5 MCG/ACT IN AERO: 2.0000 | INHALATION_SPRAY | Freq: Two times a day (BID) | RESPIRATORY_TRACT | Status: DC

## 2014-01-16 MED ORDER — PREDNISONE 20 MG PO TABS: 40.0000 mg | ORAL_TABLET | Freq: Every day | ORAL | Status: DC

## 2014-01-16 NOTE — Progress Notes (Signed)
   Subjective:    Patient ID: Joseph Berg, male    DOB: 02/07/1934, 80 y.o.   MRN: [id number]  HPI Here for spirometry. He was recently diagnosed with double pneumonia. I want him to come back this month to evaluate his lung function. He does get a little short of breath with walking but doesn't feel like it really affects his activities that he admits he really is not very physically active and really doesn't do a lot since she's been retired. He denies any chest pain. He does have significant amount of abdominal obesity. He was also a former smoker and used to smoke about 20 cigarettes per day for almost 60 years. He quit 6 years ago. He has never had a breathing test performed.  He is also very concerned about his sciatica. He says it has flared back up again on the left leg. The last time it flared was about a year ago when he did undergo physical therapy. He says it was helpful but he feels like this time is actually a little worse.   Review of Systems     Objective:   Physical Exam  Constitutional: He is oriented to person, place, and time. He appears well-developed and well-nourished.  HENT:  Head: Normocephalic and atraumatic.  Cardiovascular: Normal rate, regular rhythm and normal heart sounds.   Pulmonary/Chest: Effort normal and breath sounds normal.  Neurological: He is alert and oriented to person, place, and time.  Skin: Skin is warm and dry.  Psychiatric: He has a normal mood and affect. His behavior is normal.          Assessment & Plan:  Obstructive/restrictive disease-new diagnosis. FVC of 51%, FEV1 of 40% and a ratio of 76%. Interestingly he had significant improvement after bronchodilator. His FVC improved 6% and FEV1 improved 21%. FEF 25% improved 65%. Patient did give a good effort during the test today. We discussed putting him on Symbicort today. He may also eventually need a anti-cholinergic as well but I felt like we would start with 1 inhaler. I wanted  to add the inhaled steroid because he does have such a significant response to albuterol consistent with an asthmatic type picture. I would like to see him back in 2 months to make sure that he is doing well. Sample was provided today and per prescription sent to his pharmacy. He can call if he has any palms concerns or side effects.  Pneumonia-resolved. Patient is back to baseline and doing well.  Left-sided sciatica-will give patient a five-day prednisone burst. One about potential side effects including increased appetite. If he is not improving after the steroid burst I encouraged him to call the office back for further workup.

## 2014-01-28 ENCOUNTER — Other Ambulatory Visit: Payer: Self-pay | Admitting: *Deleted

## 2014-01-28 ENCOUNTER — Other Ambulatory Visit: Payer: Self-pay | Admitting: Family Medicine

## 2014-01-28 DIAGNOSIS — G5700 Lesion of sciatic nerve, unspecified lower limb: Secondary | ICD-10-CM

## 2014-01-28 MED ORDER — PREDNISONE 20 MG PO TABS: 40.0000 mg | ORAL_TABLET | Freq: Every day | ORAL | Status: DC

## 2014-01-28 NOTE — Telephone Encounter (Signed)
Pt called and stated that his sciatica pain has returned and wanted to know if Dr. Madilyn Fireman would send in another prescription of prednisone for him. He stated that he is willing to get back into PT however, he wouldn't be able to go until the first of the year. He said that he had good results with doing the PT. Will fwd to pcp for f/u.Audelia Hives [place]

## 2014-02-13 ENCOUNTER — Ambulatory Visit (INDEPENDENT_AMBULATORY_CARE_PROVIDER_SITE_OTHER): Payer: Medicare PPO | Admitting: Physical Therapy

## 2014-02-13 DIAGNOSIS — M2550 Pain in unspecified joint: Secondary | ICD-10-CM

## 2014-02-13 DIAGNOSIS — G5700 Lesion of sciatic nerve, unspecified lower limb: Secondary | ICD-10-CM

## 2014-02-13 DIAGNOSIS — M2650 Dentofacial functional abnormalities, unspecified: Secondary | ICD-10-CM

## 2014-02-13 DIAGNOSIS — R5381 Other malaise: Secondary | ICD-10-CM

## 2014-02-18 ENCOUNTER — Encounter (INDEPENDENT_AMBULATORY_CARE_PROVIDER_SITE_OTHER): Payer: Medicare PPO | Admitting: Physical Therapy

## 2014-02-18 DIAGNOSIS — M2550 Pain in unspecified joint: Secondary | ICD-10-CM

## 2014-02-18 DIAGNOSIS — G5700 Lesion of sciatic nerve, unspecified lower limb: Secondary | ICD-10-CM

## 2014-02-18 DIAGNOSIS — R5381 Other malaise: Secondary | ICD-10-CM

## 2014-02-18 DIAGNOSIS — M2560 Stiffness of unspecified joint, not elsewhere classified: Secondary | ICD-10-CM

## 2014-02-19 ENCOUNTER — Other Ambulatory Visit: Payer: Self-pay | Admitting: Family Medicine

## 2014-02-20 ENCOUNTER — Encounter (INDEPENDENT_AMBULATORY_CARE_PROVIDER_SITE_OTHER): Payer: Medicare PPO | Admitting: Physical Therapy

## 2014-02-20 DIAGNOSIS — M2560 Stiffness of unspecified joint, not elsewhere classified: Secondary | ICD-10-CM

## 2014-02-20 DIAGNOSIS — R5381 Other malaise: Secondary | ICD-10-CM

## 2014-02-20 DIAGNOSIS — M2550 Pain in unspecified joint: Secondary | ICD-10-CM

## 2014-02-20 DIAGNOSIS — G5700 Lesion of sciatic nerve, unspecified lower limb: Secondary | ICD-10-CM

## 2014-02-24 ENCOUNTER — Encounter (INDEPENDENT_AMBULATORY_CARE_PROVIDER_SITE_OTHER): Payer: Medicare PPO | Admitting: Physical Therapy

## 2014-02-24 DIAGNOSIS — G5700 Lesion of sciatic nerve, unspecified lower limb: Secondary | ICD-10-CM

## 2014-02-24 DIAGNOSIS — M2560 Stiffness of unspecified joint, not elsewhere classified: Secondary | ICD-10-CM

## 2014-02-24 DIAGNOSIS — M2550 Pain in unspecified joint: Secondary | ICD-10-CM

## 2014-02-24 DIAGNOSIS — R5381 Other malaise: Secondary | ICD-10-CM

## 2014-02-27 ENCOUNTER — Encounter (INDEPENDENT_AMBULATORY_CARE_PROVIDER_SITE_OTHER): Payer: Medicare PPO | Admitting: Physical Therapy

## 2014-02-27 DIAGNOSIS — M2560 Stiffness of unspecified joint, not elsewhere classified: Secondary | ICD-10-CM

## 2014-02-27 DIAGNOSIS — G5700 Lesion of sciatic nerve, unspecified lower limb: Secondary | ICD-10-CM

## 2014-02-27 DIAGNOSIS — R5381 Other malaise: Secondary | ICD-10-CM

## 2014-02-27 DIAGNOSIS — M2550 Pain in unspecified joint: Secondary | ICD-10-CM

## 2014-03-03 ENCOUNTER — Encounter (INDEPENDENT_AMBULATORY_CARE_PROVIDER_SITE_OTHER): Payer: Medicare PPO | Admitting: Physical Therapy

## 2014-03-03 DIAGNOSIS — M2560 Stiffness of unspecified joint, not elsewhere classified: Secondary | ICD-10-CM

## 2014-03-03 DIAGNOSIS — G5700 Lesion of sciatic nerve, unspecified lower limb: Secondary | ICD-10-CM

## 2014-03-03 DIAGNOSIS — M2550 Pain in unspecified joint: Secondary | ICD-10-CM

## 2014-03-03 DIAGNOSIS — R5381 Other malaise: Secondary | ICD-10-CM

## 2014-03-06 ENCOUNTER — Encounter (INDEPENDENT_AMBULATORY_CARE_PROVIDER_SITE_OTHER): Payer: Medicare PPO | Admitting: Physical Therapy

## 2014-03-06 DIAGNOSIS — G5700 Lesion of sciatic nerve, unspecified lower limb: Secondary | ICD-10-CM

## 2014-03-06 DIAGNOSIS — M2550 Pain in unspecified joint: Secondary | ICD-10-CM

## 2014-03-06 DIAGNOSIS — M2560 Stiffness of unspecified joint, not elsewhere classified: Secondary | ICD-10-CM

## 2014-03-06 DIAGNOSIS — R5381 Other malaise: Secondary | ICD-10-CM

## 2014-03-10 ENCOUNTER — Encounter (INDEPENDENT_AMBULATORY_CARE_PROVIDER_SITE_OTHER): Payer: Medicare PPO | Admitting: Physical Therapy

## 2014-03-10 DIAGNOSIS — G5700 Lesion of sciatic nerve, unspecified lower limb: Secondary | ICD-10-CM

## 2014-03-10 DIAGNOSIS — M2560 Stiffness of unspecified joint, not elsewhere classified: Secondary | ICD-10-CM

## 2014-03-10 DIAGNOSIS — R5381 Other malaise: Secondary | ICD-10-CM

## 2014-03-10 DIAGNOSIS — M2550 Pain in unspecified joint: Secondary | ICD-10-CM

## 2014-03-13 ENCOUNTER — Encounter: Payer: Self-pay | Admitting: Physical Therapy

## 2014-03-20 ENCOUNTER — Ambulatory Visit (INDEPENDENT_AMBULATORY_CARE_PROVIDER_SITE_OTHER): Payer: Medicare PPO | Admitting: Family Medicine

## 2014-03-20 ENCOUNTER — Encounter: Payer: Self-pay | Admitting: Family Medicine

## 2014-03-20 VITALS — BP 120/72 | HR 78 | Temp 98.8°F | Wt 227.0 lb

## 2014-03-20 DIAGNOSIS — I5022 Chronic systolic (congestive) heart failure: Secondary | ICD-10-CM

## 2014-03-20 DIAGNOSIS — E8881 Metabolic syndrome: Secondary | ICD-10-CM

## 2014-03-20 DIAGNOSIS — J984 Other disorders of lung: Secondary | ICD-10-CM

## 2014-03-20 DIAGNOSIS — I10 Essential (primary) hypertension: Secondary | ICD-10-CM

## 2014-03-20 DIAGNOSIS — J449 Chronic obstructive pulmonary disease, unspecified: Secondary | ICD-10-CM

## 2014-03-20 DIAGNOSIS — J439 Emphysema, unspecified: Principal | ICD-10-CM

## 2014-03-20 LAB — POCT GLYCOSYLATED HEMOGLOBIN (HGB A1C): HEMOGLOBIN A1C: 5.9

## 2014-03-20 NOTE — Progress Notes (Signed)
   Subjective:    Patient ID: Joseph Berg, male    DOB: Apr 27, 1933, 81 y.o.   MRN: [id number]  HPI Here for f/U COPD- doing well with new symbicort.  Taking it regularly. No problems with the device.  He has not had any cough or side effects. He has not had any wheezing or shortness of breath. He feels like his pneumonia is completely resolved.  Follow-up heart failure-He says he wants to know how long he needs to take the Lasix. He says it makes him urinate all day and keeps him from going out of the house. Feels like it's inhibiting him. No dizziness or lightheadedness.  Impaired fasting glucose-no increased thirst or urination except force the increase in Lasix causing some urinary issues.  Review of Systems     Objective:   Physical Exam  Constitutional: He is oriented to person, place, and time. He appears well-developed and well-nourished.  HENT:  Head: Normocephalic and atraumatic.  Cardiovascular: Normal rate, regular rhythm and normal heart sounds.   Pulmonary/Chest: Effort normal and breath sounds normal.  Neurological: He is alert and oriented to person, place, and time.  Skin: Skin is warm and dry.  Psychiatric: He has a normal mood and affect. His behavior is normal.          Assessment & Plan:  COPD- well-controlled on current regimen. Would like him to continue the Symbicort. Follow-up in 3 months.  Hypertension chronic systolic congestive heart failure-lets try decreasing the Lasix to half a tab daily. We'll see if this helps with the frequent urination in addition to continuing to control his fluid as well as his blood pressure. Follow-up in 3 months.  Impaired fasting glucose - A1c up a little bit from previous. We'll continue to work on diet and exercise. He says he asked he plans on joining the gym soon and has actually lost a couple of pounds which is fantastic.  Lab Results  Component Value Date   HGBA1C 5.9 03/20/2014

## 2014-04-28 ENCOUNTER — Telehealth: Payer: Self-pay | Admitting: *Deleted

## 2014-04-28 NOTE — Telephone Encounter (Signed)
Pt's wife called and lvm asking for rtn call.

## 2014-04-30 NOTE — Telephone Encounter (Addendum)
Pt's wife called back and stated that she wanted her husband to be seen by an ortho dr. After speaking w/her I suggested that she make an appt w/ Dr. Val Riles, [place]

## 2014-05-02 ENCOUNTER — Other Ambulatory Visit: Payer: Self-pay | Admitting: Family Medicine

## 2014-05-02 ENCOUNTER — Other Ambulatory Visit: Payer: Self-pay | Admitting: Cardiology

## 2014-05-13 ENCOUNTER — Ambulatory Visit (INDEPENDENT_AMBULATORY_CARE_PROVIDER_SITE_OTHER): Payer: Medicare PPO | Admitting: Sports Medicine

## 2014-05-13 ENCOUNTER — Encounter: Payer: Self-pay | Admitting: Sports Medicine

## 2014-05-13 VITALS — BP 158/85 | HR 73 | Ht 68.0 in | Wt 237.0 lb

## 2014-05-13 DIAGNOSIS — M5136 Other intervertebral disc degeneration, lumbar region: Secondary | ICD-10-CM

## 2014-05-13 DIAGNOSIS — M51369 Other intervertebral disc degeneration, lumbar region without mention of lumbar back pain or lower extremity pain: Secondary | ICD-10-CM | POA: Insufficient documentation

## 2014-05-13 MED ORDER — GABAPENTIN 300 MG PO CAPS: ORAL_CAPSULE | ORAL | Status: DC

## 2014-05-13 NOTE — Progress Notes (Signed)
   Subjective:    I'm seeing this patient as a consultation for:   Dr. Madilyn Fireman  CC:  Low back and leg pain  HPI:  this is a pleasant 81 year old male, for a long period of time he's had low back pain, discogenic and worse with sitting and radiating down the left leg, but not past the knee. Symptoms are moderate, persistent without any bowel or bladder dysfunction or saddle numbness. He does get cramping in his left calf with walking, and does feel better with leaning forward as when pushing a shopping cart. He has had physical therapy several times with only moderate improvement. He has already had steroids and narcotics, neither have helped. He has not yet had any neuropathic type medications.  Past medical history, Surgical history, Family history not pertinant except as noted below, Social history, Allergies, and medications have been entered into the medical record, reviewed, and no changes needed.   Review of Systems: No headache, visual changes, nausea, vomiting, diarrhea, constipation, dizziness, abdominal pain, skin rash, fevers, chills, night sweats, weight loss, swollen lymph nodes, body aches, joint swelling, muscle aches, chest pain, shortness of breath, mood changes, visual or auditory hallucinations.   Objective:   General: Well Developed, well nourished, and in no acute distress.  Neuro/Psych: Alert and oriented x3, extra-ocular muscles intact, able to move all 4 extremities, sensation grossly intact. Skin: Warm and dry, no rashes noted.  Respiratory: Not using accessory muscles, speaking in full sentences, trachea midline.  Cardiovascular: Pulses palpable, no extremity edema. Abdomen: Does not appear distended. Back Exam:  Inspection: Unremarkable  Motion: Flexion 45 deg, Extension 45 deg, Side Bending to 45 deg bilaterally,  Rotation to 45 deg bilaterally  SLR laying: Negative  XSLR laying: Negative  Palpable tenderness: left and right sacroiliac joints. FABER:  negative. Sensory change: Gross sensation intact to all lumbar and sacral dermatomes.  Reflexes: 2+ at both patellar tendons, 2+ at achilles tendons, Babinski's downgoing.  Strength at foot  Plantar-flexion: 5/5 Dorsi-flexion: 5/5 Eversion: 5/5 Inversion: 5/5  Leg strength  Quad: 5/5 Hamstring: 5/5 Hip flexor: 5/5 Hip abductors: 5/5  Gait unremarkable.  Lumbar spine x-rays show severe and diffuse degenerative changes in both the disks and facet joints.  Impression and Recommendations:   This case required medical decision making of moderate complexity.

## 2014-05-13 NOTE — Assessment & Plan Note (Signed)
Multilevel lumbar degenerative disc disease with facet arthritis, and symptoms suggestive of lumbar spinal stenosis with left-sided radiculopathy not in any particular distribution. He has already failed steroids and several courses of physical therapy so at this point we are going to get an MRI for interventional planning. I would also like to start gabapentin the meantime for pain relief. He does have some tenderness in the calf's, with some pain with walking so we certainly need to consider vascular and neurogenic claudication. I'm going to order lower extremity and upper extremity ABIs. My suspicion is that this most likely represents neurogenic claudication. Return to see me to go over MRI results and for interventional planning.

## 2014-05-19 ENCOUNTER — Ambulatory Visit (INDEPENDENT_AMBULATORY_CARE_PROVIDER_SITE_OTHER): Payer: Medicare PPO

## 2014-05-19 DIAGNOSIS — M47896 Other spondylosis, lumbar region: Secondary | ICD-10-CM

## 2014-05-19 DIAGNOSIS — M5136 Other intervertebral disc degeneration, lumbar region: Secondary | ICD-10-CM

## 2014-06-04 ENCOUNTER — Encounter: Payer: Self-pay | Admitting: Sports Medicine

## 2014-06-04 ENCOUNTER — Ambulatory Visit (INDEPENDENT_AMBULATORY_CARE_PROVIDER_SITE_OTHER): Payer: Medicare PPO | Admitting: Sports Medicine

## 2014-06-04 DIAGNOSIS — M51369 Other intervertebral disc degeneration, lumbar region without mention of lumbar back pain or lower extremity pain: Secondary | ICD-10-CM

## 2014-06-04 DIAGNOSIS — M5136 Other intervertebral disc degeneration, lumbar region: Secondary | ICD-10-CM | POA: Diagnosis not present

## 2014-06-04 MED ORDER — TRAMADOL HCL 50 MG PO TABS: ORAL_TABLET | ORAL | Status: DC

## 2014-06-04 MED ORDER — MELOXICAM 15 MG PO TABS: ORAL_TABLET | ORAL | Status: DC

## 2014-06-04 NOTE — Progress Notes (Signed)
  Subjective:    CC: MRI results  HPI: Joseph Berg returns, he has low back pain with axial and a discogenic component, with radiation down the left leg and what appears to be an L4 distribution, he does endorse that it goes to the medial aspect of his ankle and foot. He has been through physical therapy, occasions, and has persistent pain and is amenable to proceeding with interventional treatment. He does get some cramping in the calf with walking, he has not yet had an ABI. Our suspicion was that this represented neurogenic claudication.  Past medical history, Surgical history, Family history not pertinant except as noted below, Social history, Allergies, and medications have been entered into the medical record, reviewed, and no changes needed.   Review of Systems: No fevers, chills, night sweats, weight loss, chest pain, or shortness of breath.   Objective:    General: Well Developed, well nourished, and in no acute distress.  Neuro: Alert and oriented x3, extra-ocular muscles intact, sensation grossly intact.  HEENT: Normocephalic, atraumatic, pupils equal round reactive to light, neck supple, no masses, no lymphadenopathy, thyroid nonpalpable.  Skin: Warm and dry, no rashes. Cardiac: Regular rate and rhythm, no murmurs rubs or gallops, no lower extremity edema.  Respiratory: Clear to auscultation bilaterally. Not using accessory muscles, speaking in full sentences.  MRI shows multilevel degenerative disc disease at the L3-S1 levels, worse at the L4-L5 level with central canal as well as severe left-sided foraminal stenosis  Impression and Recommendations:

## 2014-06-04 NOTE — Assessment & Plan Note (Signed)
[place] has multilevel lumbar degenerative disc disease with clinically what appears to be an L4 radiculopathy. I do think the most symptomatic level is the A5-B9 level, and certainly there is potential for left-sided foraminal stenosis here. Adding meloxicam, continue gabapentin, he is doing in 2-3 times per day. We are going to add tramadol for breakthrough pain and proceed with an epidural on the left at the L4-L5 level. Return to see me 3-4 weeks after the epidural to evaluate response.

## 2014-06-09 ENCOUNTER — Other Ambulatory Visit: Payer: Self-pay | Admitting: Family Medicine

## 2014-06-11 ENCOUNTER — Encounter: Payer: Self-pay | Admitting: Cardiology

## 2014-06-11 ENCOUNTER — Ambulatory Visit (INDEPENDENT_AMBULATORY_CARE_PROVIDER_SITE_OTHER): Payer: Medicare PPO | Admitting: Cardiology

## 2014-06-11 DIAGNOSIS — I25810 Atherosclerosis of coronary artery bypass graft(s) without angina pectoris: Secondary | ICD-10-CM | POA: Diagnosis not present

## 2014-06-11 DIAGNOSIS — I714 Abdominal aortic aneurysm, without rupture, unspecified: Secondary | ICD-10-CM

## 2014-06-11 NOTE — Assessment & Plan Note (Signed)
Schedule followup abdominal ultrasound. 

## 2014-06-11 NOTE — Patient Instructions (Signed)
Your physician wants you to follow-up in: [place] will receive a reminder letter in the mail two months in advance. If you don't receive a letter, please call our office to schedule the follow-up appointment.   Your physician has requested that you have an abdominal aorta duplex. During this test, an ultrasound is used to evaluate the aorta. Allow 30 minutes for this exam. Do not eat after midnight the day before and avoid carbonated beverages   Your physician recommends that you return for lab work [place]

## 2014-06-11 NOTE — Assessment & Plan Note (Signed)
Continue present medications. Follow blood pressure at home and increase as needed.

## 2014-06-11 NOTE — Assessment & Plan Note (Signed)
Continue aspirin and statin. 

## 2014-06-11 NOTE — Assessment & Plan Note (Signed)
Repeat carotid Dopplers July 2017.

## 2014-06-11 NOTE — Assessment & Plan Note (Signed)
Continue statin. Check lipids and liver. 

## 2014-06-11 NOTE — Progress Notes (Signed)
HPI: FU CHF and CAD. Patient had a myocardial infarction followed by coronary artery bypassing graft in 1994 in [place]. Echo in June of 2014 showed an ejection fraction of 35-40%. There was mild left ventricular hypertrophy. Right ventricular function was mildly reduced. Cardiac catheterization in June of 2014 showed a 90% left main, occluded LAD, normal ramus, occluded circumflex and occluded right coronary artery. The LIMA to the LAD was patent. Ejection fraction was 35-40%. The patient underwent redo coronary artery bypass and graft in July of 2014 with a saphenous vein graft to the ramus and a saphenous vein graft to the PDA. Abdominal ultrasound May 2015 showed 3.5 cm abdominal aortic aneurysm and small iliac aneurysms. Carotid Dopplers June 2015 showed 1-39% bilateral stenosis and follow-up recommended 2 years. Echocardiogram June 2015 showed ejection fraction 40-45%, mild left ventricular hypertrophy, mildly dilated right ventricle and right atrium. Since I last saw him, he denies dyspnea, chest pain, palpitations or syncope.  Current Outpatient Prescriptions  Medication Sig Dispense Refill  . acetaminophen (TYLENOL) 500 MG tablet Take 1-2 tablets (500-1,000 mg total) by mouth every 6 (six) hours as needed for pain. 30 tablet 0  . AMBULATORY NON FORMULARY MEDICATION Medication Name: shingles vaccine IM x 1 1 vial 0  . aspirin EC 325 MG EC tablet Take 1 tablet (325 mg total) by mouth daily. 30 tablet 0  . budesonide-formoterol (SYMBICORT) 160-4.5 MCG/ACT inhaler Inhale 2 puffs into the lungs 2 (two) times daily. 1 Inhaler 0  . carvedilol (COREG) 12.5 MG tablet TAKE ONE TABLET BY MOUTH TWICE DAILY  180 tablet 1  . cholecalciferol (VITAMIN D) 1000 UNITS tablet Take 1,000 Units by mouth daily.    . CRESTOR 40 MG tablet TAKE ONE TABLET BY MOUTH ONE TIME DAILY  30 tablet 10  . Cyanocobalamin (VITAMIN B-12 PO) Take 1 tablet by mouth daily.    . finasteride (PROSCAR) 5 MG tablet TAKE ONE  TABLET BY MOUTH ONE TIME DAILY 30 tablet 0  . furosemide (LASIX) 40 MG tablet TAKE ONE TABLET BY MOUTH ONE TIME DAILY  90 tablet 3  . gabapentin (NEURONTIN) 300 MG capsule Two to three caps daily. 1 capsule 0  . losartan (COZAAR) 50 MG tablet TAKE ONE TABLET BY MOUTH ONE TIME DAILY  30 tablet 11  . meloxicam (MOBIC) 15 MG tablet One tab PO qAM with breakfast 30 tablet 3  . Multiple Vitamin (MULTIVITAMIN) capsule Take 1 capsule by mouth daily.    . traMADol (ULTRAM) 50 MG tablet 1-2 tabs by mouth Q8 hours, maximum 6 tabs per day. 90 tablet 0  . Ascorbic Acid (VITAMIN C) 1000 MG tablet Take 1,000 mg by mouth daily.    . [DISCONTINUED] metoprolol (TOPROL-XL) 100 MG 24 hr tablet Take 100 mg by mouth 2 (two) times daily.       No current facility-administered medications for this visit.     Past Medical History  Diagnosis Date  . Heart attack 1994  . Hypertension   . CAD (coronary artery disease)     a. s/p CABG x1 with LIMA-LAD 1994. b. LM & 3v CAD by cath 07/2012, pending CABG.  . OSA on CPAP   . Hypoparathyroidism   . BPH (benign prostatic hyperplasia)   . Hyperlipidemia   . CHF (congestive heart failure)     a. EF 35-40% by echo 07/2012.  [name] NSVT (nonsustained ventricular tachycardia)     a. Seen on tele 07/2012.  . Pseudoaneurysm of left  femoral artery     a. post cath s/p compression 07/2012, associated w/ anemia.  . Emphysema     a. Moderate emphysema by CT 06/2012.  . Sciatica 2012    Qualifier: Diagnosis of  By: Madilyn Fireman MD, Barnetta Chapel      Past Surgical History  Procedure Laterality Date  . Coronary artery bypass graft  03-23-92  . Cataract extraction, bilateral    . Coronary artery bypass graft N/A 08/14/2012    Procedure: REDO CORONARY ARTERY BYPASS GRAFTING (CABG);  Surgeon: Gaye Pollack, MD;  Location: [facility];  Service: Open Heart Surgery;  Laterality: N/A;  . Intraoperative transesophageal echocardiogram N/A 08/14/2012    Procedure: INTRAOPERATIVE TRANSESOPHAGEAL  ECHOCARDIOGRAM;  Surgeon: Gaye Pollack, MD;  Location[facility];  Service: Open Heart Surgery;  Laterality: N/A;    History   Social History  . Marital Status: Married    Spouse Name: N/A  . Number of Children: N/A  . Years of Education: N/A   Occupational History  . Not on file.   Social History Main Topics  . Smoking status: Former Smoker -- 1.00 packs/day for 60 years    Quit date: 10/08/2010  . Smokeless tobacco: Not on file  . Alcohol Use: Yes     Comment: Occasional  . Drug Use: No  . 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 Not on file   Social History Narrative    ROS: back and left lower extremity pain but no fevers or chills, productive cough, hemoptysis, dysphasia, odynophagia, melena, hematochezia, dysuria, hematuria, rash, seizure activity, orthopnea, PND, pedal edema, claudication. Remaining systems are negative.  Physical Exam: Well-developed obese in no acute distress.  Skin is warm and dry.  HEENT is normal.  Neck is supple.  Chest is clear to auscultation with normal expansion.  Cardiovascular exam is regular rate and rhythm.  Abdominal exam nontender or distended. No masses palpated. Extremities show no edema. neuro grossly intact  ECG sinus rhythm at a rate of 66. Right bundle branch block.

## 2014-06-11 NOTE — Assessment & Plan Note (Signed)
Continue ARB and beta blocker. 

## 2014-06-11 NOTE — Assessment & Plan Note (Signed)
euvolemic on examination. Continue present dose of Lasix. Check potassium and renal function.

## 2014-06-12 LAB — HEPATIC FUNCTION PANEL
ALT: 19 U/L (ref 0–53)
AST: 17 U/L (ref 0–37)
Albumin: 3.9 g/dL (ref 3.5–5.2)
Alkaline Phosphatase: 43 U/L (ref 39–117)
Bilirubin, Direct: 0.2 mg/dL (ref 0.0–0.3)
Indirect Bilirubin: 0.5 mg/dL (ref 0.2–1.2)
TOTAL PROTEIN: 6.3 g/dL (ref 6.0–8.3)
Total Bilirubin: 0.7 mg/dL (ref 0.2–1.2)

## 2014-06-12 LAB — LIPID PANEL
Cholesterol: 95 mg/dL (ref 0–200)
HDL: 35 mg/dL — AB (ref >=40)
LDL CALC: 30 mg/dL (ref 0–99)
TRIGLYCERIDES: 151 mg/dL — AB (ref <150)
Total CHOL/HDL Ratio: 2.7 Ratio
VLDL: 30 mg/dL (ref 0–40)

## 2014-06-12 LAB — BASIC METABOLIC PANEL
BUN: 19 mg/dL (ref 6–23)
CHLORIDE: 102 meq/L (ref 96–112)
CO2: 28 mEq/L (ref 19–32)
Calcium: 10 mg/dL (ref 8.4–10.5)
Creat: 1.02 mg/dL (ref 0.50–1.35)
GLUCOSE: 100 mg/dL — AB (ref 70–99)
POTASSIUM: 4.4 meq/L (ref 3.5–5.3)
Sodium: 142 mEq/L (ref 135–145)

## 2014-06-16 ENCOUNTER — Encounter: Payer: Self-pay | Admitting: *Deleted

## 2014-06-18 ENCOUNTER — Ambulatory Visit (INDEPENDENT_AMBULATORY_CARE_PROVIDER_SITE_OTHER): Payer: Medicare PPO | Admitting: Family Medicine

## 2014-06-18 ENCOUNTER — Encounter: Payer: Self-pay | Admitting: Family Medicine

## 2014-06-18 VITALS — BP 134/79 | HR 63 | Wt 226.0 lb

## 2014-06-18 DIAGNOSIS — Z6834 Body mass index (BMI) 34.0-34.9, adult: Secondary | ICD-10-CM

## 2014-06-18 DIAGNOSIS — J984 Other disorders of lung: Secondary | ICD-10-CM

## 2014-06-18 DIAGNOSIS — J449 Chronic obstructive pulmonary disease, unspecified: Secondary | ICD-10-CM | POA: Diagnosis not present

## 2014-06-18 DIAGNOSIS — I10 Essential (primary) hypertension: Secondary | ICD-10-CM

## 2014-06-18 DIAGNOSIS — J439 Emphysema, unspecified: Principal | ICD-10-CM

## 2014-06-18 NOTE — Progress Notes (Signed)
   Subjective:    Patient ID: Joseph Berg, male    DOB: 10/29/1933, 81 y.o.   MRN: [id number]  HPI   Hypertension- Pt denies chest pain, SOB, dizziness, or heart palpitations.  Taking meds as directed w/o problems.  Denies medication side effects.    COPD f/u - no change ibn cough or sputum. + runny nose.  Using symbicort dialy withoutany s.e or complications.    Review of Systems     Objective:   Physical Exam  Constitutional: He is oriented to person, place, and time. He appears well-developed and well-nourished.  HENT:  Head: Normocephalic and atraumatic.  Cardiovascular: Normal rate, regular rhythm and normal heart sounds.   Pulmonary/Chest: Effort normal and breath sounds normal.  Neurological: He is alert and oriented to person, place, and time.  Skin: Skin is warm and dry.  Psychiatric: He has a normal mood and affect. His behavior is normal.          Assessment & Plan:  HTN- well controlled. Continue current regimen. Follow-up in 6 months.  COPD - Stable. Doing well on regimen. No recent flares or exacerbations. Will need to get flu shot in the fall.  BMI 34 - discussed would like to see him lost down to 200 lbs.  he has been working on this and gave him encouragement to continue. Next  Impaired fasting glucose-due to recheck A1c in about 4 months. Will follow-up then.

## 2014-06-20 ENCOUNTER — Ambulatory Visit
Admission: RE | Admit: 2014-06-20 | Discharge: 2014-06-20 | Disposition: A | Discharge status: Home / Self Care | Payer: Medicare PPO | Source: Ambulatory Visit | Attending: Sports Medicine | Admitting: Sports Medicine

## 2014-06-20 VITALS — BP 150/74 | HR 63

## 2014-06-20 DIAGNOSIS — E669 Obesity, unspecified: Secondary | ICD-10-CM | POA: Insufficient documentation

## 2014-06-20 DIAGNOSIS — M5432 Sciatica, left side: Secondary | ICD-10-CM

## 2014-06-20 MED ORDER — METHYLPREDNISOLONE ACETATE 40 MG/ML INJ SUSP (RADIOLOG
120.0000 mg | Freq: Once | INTRAMUSCULAR | Status: AC
  Administered 2014-06-20: 120 mg via EPIDURAL

## 2014-06-20 MED ORDER — IOHEXOL 180 MG/ML  SOLN
1.0000 mL | Freq: Once | INTRAMUSCULAR | Status: AC | PRN
  Administered 2014-06-20: 1 mL via EPIDURAL

## 2014-06-20 NOTE — Discharge Instructions (Signed)

## 2014-06-23 ENCOUNTER — Ambulatory Visit (HOSPITAL_BASED_OUTPATIENT_CLINIC_OR_DEPARTMENT_OTHER)
Admission: RE | Admit: 2014-06-23 | Discharge: 2014-06-23 | Disposition: A | Discharge status: Home / Self Care | Payer: Medicare PPO | Source: Ambulatory Visit | Attending: Cardiology | Admitting: Cardiology

## 2014-06-23 DIAGNOSIS — I714 Abdominal aortic aneurysm, without rupture, unspecified: Secondary | ICD-10-CM

## 2014-06-24 ENCOUNTER — Telehealth: Payer: Self-pay | Admitting: Cardiology

## 2014-06-26 NOTE — Telephone Encounter (Signed)
Close encounter 

## 2014-07-03 ENCOUNTER — Ambulatory Visit (INDEPENDENT_AMBULATORY_CARE_PROVIDER_SITE_OTHER): Payer: Medicare PPO | Admitting: Sports Medicine

## 2014-07-03 ENCOUNTER — Encounter: Payer: Self-pay | Admitting: Sports Medicine

## 2014-07-03 VITALS — BP 116/67 | HR 73 | Wt 230.0 lb

## 2014-07-03 DIAGNOSIS — M51369 Other intervertebral disc degeneration, lumbar region without mention of lumbar back pain or lower extremity pain: Secondary | ICD-10-CM

## 2014-07-03 DIAGNOSIS — M5136 Other intervertebral disc degeneration, lumbar region: Secondary | ICD-10-CM

## 2014-07-03 NOTE — Progress Notes (Signed)
  Subjective:    CC: Follow-up after epidural  HPI: Lumbar degenerative disc disease: Pain is now completely resolved with regards to back pain and left-sided radiculopathy after L4-L5 epidural. Patient is extremely happy with results so far.  Past medical history, Surgical history, Family history not pertinant except as noted below, Social history, Allergies, and medications have been entered into the medical record, reviewed, and no changes needed.   Review of Systems: No fevers, chills, night sweats, weight loss, chest pain, or shortness of breath.   Objective:    General: Well Developed, well nourished, and in no acute distress.  Neuro: Alert and oriented x3, extra-ocular muscles intact, sensation grossly intact.  HEENT: Normocephalic, atraumatic, pupils equal round reactive to light, neck supple, no masses, no lymphadenopathy, thyroid nonpalpable.  Skin: Warm and dry, no rashes. Cardiac: Regular rate and rhythm, no murmurs rubs or gallops, no lower extremity edema.  Respiratory: Clear to auscultation bilaterally. Not using accessory muscles, speaking in full sentences.  Impression and Recommendations:    I spent 25 minutes with this patient, 50% was face-to-face time counseling regarding the natural history of lumbar radiculopathy.

## 2014-07-03 NOTE — Assessment & Plan Note (Signed)
Excellent response to a left-sided L4-L5 epidural with complete relief of pain. Return as needed. I do not think we need to proceed with ABIs.

## 2014-07-13 ENCOUNTER — Other Ambulatory Visit: Payer: Self-pay | Admitting: Family Medicine

## 2014-08-22 ENCOUNTER — Other Ambulatory Visit: Payer: Self-pay | Admitting: Family Medicine

## 2014-08-28 ENCOUNTER — Other Ambulatory Visit: Payer: Self-pay | Admitting: Cardiology

## 2014-08-28 ENCOUNTER — Other Ambulatory Visit: Payer: Self-pay

## 2014-08-28 MED ORDER — ROSUVASTATIN CALCIUM 40 MG PO TABS: 40.0000 mg | ORAL_TABLET | Freq: Every day | ORAL | Status: DC

## 2014-09-15 ENCOUNTER — Encounter: Payer: Self-pay | Admitting: Sports Medicine

## 2014-09-15 ENCOUNTER — Ambulatory Visit (INDEPENDENT_AMBULATORY_CARE_PROVIDER_SITE_OTHER): Payer: Medicare PPO | Admitting: Sports Medicine

## 2014-09-15 DIAGNOSIS — M5136 Other intervertebral disc degeneration, lumbar region: Secondary | ICD-10-CM | POA: Diagnosis not present

## 2014-09-15 DIAGNOSIS — M51369 Other intervertebral disc degeneration, lumbar region without mention of lumbar back pain or lower extremity pain: Secondary | ICD-10-CM

## 2014-09-15 MED ORDER — TRAMADOL HCL 50 MG PO TABS: ORAL_TABLET | ORAL | Status: DC

## 2014-09-15 NOTE — Assessment & Plan Note (Signed)
Fantastic response to a left-sided L4-L5 intralaminar epidural in May, patient desires a repeat, done by the same doctor. A bit of pain medicine will prescribed in the meantime.

## 2014-09-15 NOTE — Progress Notes (Signed)
  Subjective:    CC: Follow-up  HPI: This is a pleasant 81 year old male, he did extremely well with an epidural sometime ago, at this point he is having a recurrence of pain and desires a repeat epidural. He would also like a handicapped permit. Pain is moderate, persistent, radiating down the left leg.  Past medical history, Surgical history, Family history not pertinant except as noted below, Social history, Allergies, and medications have been entered into the medical record, reviewed, and no changes needed.   Review of Systems: No fevers, chills, night sweats, weight loss, chest pain, or shortness of breath.   Objective:    General: Well Developed, well nourished, and in no acute distress.  Neuro: Alert and oriented x3, extra-ocular muscles intact, sensation grossly intact.  HEENT: Normocephalic, atraumatic, pupils equal round reactive to light, neck supple, no masses, no lymphadenopathy, thyroid nonpalpable.  Skin: Warm and dry, no rashes. Cardiac: Regular rate and rhythm, no murmurs rubs or gallops, no lower extremity edema.  Respiratory: Clear to auscultation bilaterally. Not using accessory muscles, speaking in full sentences. Back Exam:  Inspection: Unremarkable  Motion: Flexion 45 deg, Extension 45 deg, Side Bending to 45 deg bilaterally,  Rotation to 45 deg bilaterally  SLR laying: Negative  XSLR laying: Negative  Palpable tenderness: None. FABER: negative. Sensory change: Gross sensation intact to all lumbar and sacral dermatomes.  Reflexes: 2+ at both patellar tendons, 2+ at achilles tendons, Babinski's downgoing.  Strength at foot  Plantar-flexion: 5/5 Dorsi-flexion: 5/5 Eversion: 5/5 Inversion: 5/5  Leg strength  Quad: 5/5 Hamstring: 5/5 Hip flexor: 5/5 Hip abductors: 5/5  Gait unremarkable.  Impression and Recommendations:    I spent 25 minutes with this patient, greater than 50% was face-to-face time counseling regarding the above diagnoses

## 2014-09-25 ENCOUNTER — Other Ambulatory Visit: Payer: Self-pay | Admitting: Cardiology

## 2014-09-28 ENCOUNTER — Other Ambulatory Visit: Payer: Self-pay | Admitting: Cardiology

## 2014-10-02 ENCOUNTER — Ambulatory Visit
Admission: RE | Admit: 2014-10-02 | Discharge: 2014-10-02 | Disposition: A | Discharge status: Home / Self Care | Payer: Medicare PPO | Source: Ambulatory Visit | Attending: Sports Medicine | Admitting: Sports Medicine

## 2014-10-02 VITALS — BP 158/76 | HR 66

## 2014-10-02 DIAGNOSIS — M5416 Radiculopathy, lumbar region: Secondary | ICD-10-CM | POA: Diagnosis not present

## 2014-10-02 DIAGNOSIS — M5432 Sciatica, left side: Secondary | ICD-10-CM

## 2014-10-02 MED ORDER — METHYLPREDNISOLONE ACETATE 40 MG/ML INJ SUSP (RADIOLOG
120.0000 mg | Freq: Once | INTRAMUSCULAR | Status: AC
  Administered 2014-10-02: 120 mg via EPIDURAL

## 2014-10-02 MED ORDER — IOHEXOL 180 MG/ML  SOLN
1.0000 mL | Freq: Once | INTRAMUSCULAR | Status: DC | PRN
  Administered 2014-10-02: 1 mL via EPIDURAL

## 2014-10-02 NOTE — Discharge Instructions (Signed)

## 2014-10-16 ENCOUNTER — Other Ambulatory Visit: Payer: Self-pay | Admitting: Family Medicine

## 2014-10-20 ENCOUNTER — Encounter: Payer: Self-pay | Admitting: Family Medicine

## 2014-10-20 ENCOUNTER — Ambulatory Visit (INDEPENDENT_AMBULATORY_CARE_PROVIDER_SITE_OTHER): Payer: Medicare PPO | Admitting: Family Medicine

## 2014-10-20 VITALS — BP 129/88 | HR 75 | Temp 97.9°F | Wt 221.0 lb

## 2014-10-20 DIAGNOSIS — Z23 Encounter for immunization: Secondary | ICD-10-CM | POA: Diagnosis not present

## 2014-10-20 DIAGNOSIS — I10 Essential (primary) hypertension: Secondary | ICD-10-CM | POA: Diagnosis not present

## 2014-10-20 DIAGNOSIS — J439 Emphysema, unspecified: Secondary | ICD-10-CM

## 2014-10-20 DIAGNOSIS — D692 Other nonthrombocytopenic purpura: Secondary | ICD-10-CM

## 2014-10-20 DIAGNOSIS — Z6833 Body mass index (BMI) 33.0-33.9, adult: Secondary | ICD-10-CM

## 2014-10-20 DIAGNOSIS — J449 Chronic obstructive pulmonary disease, unspecified: Secondary | ICD-10-CM

## 2014-10-20 DIAGNOSIS — J984 Other disorders of lung: Secondary | ICD-10-CM

## 2014-10-20 DIAGNOSIS — R7301 Impaired fasting glucose: Secondary | ICD-10-CM

## 2014-10-20 LAB — POCT GLYCOSYLATED HEMOGLOBIN (HGB A1C): Hemoglobin A1C: 5.7

## 2014-10-20 NOTE — Progress Notes (Signed)
   Subjective:    Patient ID: Joseph Berg, male    DOB: 10-24-1933, 81 y.o.   MRN: [id number]  HPI IFG - no increased thirst or urination. Last A1c was 5.9. He has lost about 9 pounds since I last saw him in May. He has really cut back on his bread.  Lab Results  Component Value Date   HGBA1C 5.9 03/20/2014   Hypertension- Pt denies chest pain, SOB, dizziness, or heart palpitations.  Taking meds as directed w/o problems.  Denies medication side effects.     COPD-well on current regimen. No recent exacerbations. Willing to get flu shot today.No SOB. Has been staying out of the heat this summer.    Has some bruising on the forearms.  Says wants to know what to do about it to make it go away.  He is on ASA and fish oil.    Review of Systems     Objective:   Physical Exam  Constitutional: He is oriented to person, place, and time. He appears well-developed and well-nourished.  HENT:  Head: Normocephalic and atraumatic.  Cardiovascular: Normal rate, regular rhythm and normal heart sounds.   Pulmonary/Chest: Effort normal and breath sounds normal.  Neurological: He is alert and oriented to person, place, and time.  Skin: Skin is warm and dry.  Psychiatric: He has a normal mood and affect. His behavior is normal.          Assessment & Plan:  IFG - well controlled.  A1C is down 5.7. F/U in 6 months.    HTN - well controlled. Continue current regimen.congratulated him on 9 lbs weight loss!!!!!    COPD - stable. No recent flares.    BMI 33 - doing well overall. Down 9 lbs.  Continue to work on diet and exercise. Really stressed more walking for exercise.   Flu vaccine given today.  Senile purpura-

## 2014-10-28 ENCOUNTER — Other Ambulatory Visit: Payer: Self-pay

## 2014-10-28 MED ORDER — CARVEDILOL 12.5 MG PO TABS: 12.5000 mg | ORAL_TABLET | Freq: Two times a day (BID) | ORAL | Status: DC

## 2014-12-01 ENCOUNTER — Encounter: Payer: Self-pay | Admitting: Sports Medicine

## 2014-12-01 ENCOUNTER — Ambulatory Visit (INDEPENDENT_AMBULATORY_CARE_PROVIDER_SITE_OTHER): Payer: Medicare PPO | Admitting: Sports Medicine

## 2014-12-01 VITALS — BP 147/67 | HR 73 | Wt 221.0 lb

## 2014-12-01 DIAGNOSIS — I739 Peripheral vascular disease, unspecified: Secondary | ICD-10-CM | POA: Diagnosis not present

## 2014-12-01 DIAGNOSIS — M51369 Other intervertebral disc degeneration, lumbar region without mention of lumbar back pain or lower extremity pain: Secondary | ICD-10-CM

## 2014-12-01 DIAGNOSIS — M5136 Other intervertebral disc degeneration, lumbar region: Secondary | ICD-10-CM | POA: Diagnosis not present

## 2014-12-01 NOTE — Assessment & Plan Note (Signed)
Good response to previous L4-L5 epidurals, recently had #2, felt fantastic and power washed his entire house, having a recurrence of pain. We are going to proceed with left L4-L5 interlaminar epidural #3, he does desire to discuss this with neurosurgery, his pain is predominantly radicular with essentially no axial pain so he would probably be a good surgical candidate. He does get some claudication-type pains we are also going to obtain an ABI to ensure there is no element of vascular claudication, he is a vasculopath with a previous coronary bypass.

## 2014-12-01 NOTE — Progress Notes (Signed)
  Subjective:    CC: Back pain recurrence  HPI: Joseph Berg's a pleasant 81 year old male, we treated him successfully with a left-sided L4-L5 interlaminar epidural, this provided months of relief, he had a second epidural and was doing well until he power washed his entire house, he no has a recurrence of pain, only minimally axial predominant radicular, same as before and is agreeable to proceed with epidural #3 in the series.  He also endorses pain in his leg when walking, and has a history of coronary artery bypass surgery making him a vasculopath. He does desire surgical second opinion.  Past medical history, Surgical history, Family history not pertinant except as noted below, Social history, Allergies, and medications have been entered into the medical record, reviewed, and no changes needed.   Review of Systems: No fevers, chills, night sweats, weight loss, chest pain, or shortness of breath.   Objective:    General: Well Developed, well nourished, and in no acute distress.  Neuro: Alert and oriented x3, extra-ocular muscles intact, sensation grossly intact.  HEENT: Normocephalic, atraumatic, pupils equal round reactive to light, neck supple, no masses, no lymphadenopathy, thyroid nonpalpable.  Skin: Warm and dry, no rashes. Cardiac: Regular rate and rhythm, no murmurs rubs or gallops, no lower extremity edema.  Respiratory: Clear to auscultation bilaterally. Not using accessory muscles, speaking in full sentences.  Impression and Recommendations:    I spent 25 minutes with this patient, greater than 50% was face-to-face time counseling regarding the above diagnoses

## 2014-12-03 ENCOUNTER — Ambulatory Visit (HOSPITAL_COMMUNITY): Payer: Medicare PPO

## 2014-12-05 ENCOUNTER — Ambulatory Visit (HOSPITAL_COMMUNITY)
Admission: RE | Admit: 2014-12-05 | Discharge: 2014-12-05 | Disposition: A | Discharge status: Home / Self Care | Payer: Medicare PPO | Source: Ambulatory Visit | Attending: Sports Medicine | Admitting: Sports Medicine

## 2014-12-05 DIAGNOSIS — M79609 Pain in unspecified limb: Secondary | ICD-10-CM | POA: Diagnosis not present

## 2014-12-05 DIAGNOSIS — I2510 Atherosclerotic heart disease of native coronary artery without angina pectoris: Secondary | ICD-10-CM | POA: Diagnosis not present

## 2014-12-05 DIAGNOSIS — M5136 Other intervertebral disc degeneration, lumbar region: Secondary | ICD-10-CM

## 2014-12-05 DIAGNOSIS — I70219 Atherosclerosis of native arteries of extremities with intermittent claudication, unspecified extremity: Secondary | ICD-10-CM | POA: Diagnosis not present

## 2014-12-05 NOTE — Progress Notes (Signed)
VASCULAR LAB PRELIMINARY  ARTERIAL  ABI completed:    RIGHT    LEFT    PRESSURE WAVEFORM  PRESSURE WAVEFORM  BRACHIAL 152 Triphasic BRACHIAL 166 Triphasic  DP 114 Monophasic DP 110 Triphasic  AT   AT    PT 100 Dampened monophasic PT 166 Triphasic  PER   PER    GREAT TOE  NA GREAT TOE  NA    RIGHT LEFT  ABI 0.69 1.00   The right ABI is suggestive of moderate arterial insufficiency. The left ABI is within normal limits.  12/05/2014 10:01 AM Maudry Mayhew, RVT, RDCS, RDMS

## 2014-12-09 DIAGNOSIS — I739 Peripheral vascular disease, unspecified: Secondary | ICD-10-CM | POA: Insufficient documentation

## 2014-12-09 MED ORDER — CILOSTAZOL 100 MG PO TABS: 100.0000 mg | ORAL_TABLET | Freq: Two times a day (BID) | ORAL | Status: DC

## 2014-12-09 NOTE — Addendum Note (Signed)
Addended by: Silverio Decamp on: 12/09/2014 01:21 PM   Modules accepted: Orders

## 2014-12-09 NOTE — Assessment & Plan Note (Signed)
Adding Pletal, patient will keep follow-up with vascular surgery for consideration of imaging.

## 2014-12-16 DIAGNOSIS — M5126 Other intervertebral disc displacement, lumbar region: Secondary | ICD-10-CM | POA: Diagnosis not present

## 2014-12-16 DIAGNOSIS — M4726 Other spondylosis with radiculopathy, lumbar region: Secondary | ICD-10-CM | POA: Diagnosis not present

## 2014-12-16 DIAGNOSIS — Z6834 Body mass index (BMI) 34.0-34.9, adult: Secondary | ICD-10-CM | POA: Diagnosis not present

## 2014-12-17 ENCOUNTER — Ambulatory Visit
Admission: RE | Admit: 2014-12-17 | Discharge: 2014-12-17 | Disposition: A | Discharge status: Home / Self Care | Payer: Medicare PPO | Source: Ambulatory Visit | Attending: Sports Medicine | Admitting: Sports Medicine

## 2014-12-17 DIAGNOSIS — M545 Low back pain: Secondary | ICD-10-CM | POA: Diagnosis not present

## 2014-12-17 MED ORDER — METHYLPREDNISOLONE ACETATE 40 MG/ML INJ SUSP (RADIOLOG
120.0000 mg | Freq: Once | INTRAMUSCULAR | Status: AC
  Administered 2014-12-17: 120 mg via EPIDURAL

## 2014-12-17 MED ORDER — IOHEXOL 180 MG/ML  SOLN
1.0000 mL | Freq: Once | INTRAMUSCULAR | Status: DC | PRN
  Administered 2014-12-17: 1 mL via EPIDURAL

## 2014-12-19 ENCOUNTER — Telehealth: Payer: Self-pay | Admitting: *Deleted

## 2014-12-19 DIAGNOSIS — I714 Abdominal aortic aneurysm, without rupture, unspecified: Secondary | ICD-10-CM

## 2014-12-19 NOTE — Telephone Encounter (Signed)
Spoke with pt, abdominal US scheduled 12-23-14 @ 8:15 am in the [place] location. He will be NPO 6 hours prior to testing.

## 2014-12-19 NOTE — Telephone Encounter (Signed)
Left message for pt to call  He is due for AAA follow up doppler. Order placed for [facility] [place]

## 2014-12-23 ENCOUNTER — Ambulatory Visit (HOSPITAL_BASED_OUTPATIENT_CLINIC_OR_DEPARTMENT_OTHER): Payer: Medicare PPO

## 2014-12-25 ENCOUNTER — Telehealth: Payer: Self-pay | Admitting: Cardiology

## 2014-12-25 NOTE — Telephone Encounter (Signed)
Spoke with pt, questions regarding abdominal aortic US answered.

## 2014-12-25 NOTE — Telephone Encounter (Signed)
Left message for pt to call.

## 2014-12-25 NOTE — Telephone Encounter (Signed)
Pt's wife called in stating that the pt has a few questions about his test that he is having tomorrow. Please f/u with him  Thanks

## 2014-12-26 ENCOUNTER — Ambulatory Visit (HOSPITAL_BASED_OUTPATIENT_CLINIC_OR_DEPARTMENT_OTHER)
Admission: RE | Admit: 2014-12-26 | Discharge: 2014-12-26 | Disposition: A | Discharge status: Home / Self Care | Payer: Medicare PPO | Source: Ambulatory Visit | Attending: Cardiology | Admitting: Cardiology

## 2014-12-26 DIAGNOSIS — I714 Abdominal aortic aneurysm, without rupture, unspecified: Secondary | ICD-10-CM

## 2014-12-26 DIAGNOSIS — I728 Aneurysm of other specified arteries: Secondary | ICD-10-CM | POA: Insufficient documentation

## 2014-12-29 ENCOUNTER — Other Ambulatory Visit: Payer: Self-pay | Admitting: Neurosurgery

## 2014-12-30 ENCOUNTER — Telehealth: Payer: Self-pay | Admitting: *Deleted

## 2014-12-30 NOTE — Telephone Encounter (Signed)
Ok for surgery; continue present meds pre and post op. Joseph Berg

## 2014-12-30 NOTE — Telephone Encounter (Signed)
His note will be faxed to the number provided

## 2014-12-30 NOTE — Telephone Encounter (Signed)
Pt is scheduled to have L45 microdiskectomy with kyle cabbell md on Wednesday 02-10-14 due to lumbar herniated disc. They are requesting clearance for surgery. Will forward for dr Stanford Breed review

## 2015-01-10 ENCOUNTER — Other Ambulatory Visit: Payer: Self-pay | Admitting: Family Medicine

## 2015-01-12 ENCOUNTER — Other Ambulatory Visit: Payer: Self-pay

## 2015-01-12 NOTE — Telephone Encounter (Signed)
Medication Detail      Disp Refills Start End     carvedilol (COREG) 12.5 MG tablet 180 tablet 0 10/28/2014     Sig - Route: Take 1 tablet (12.5 mg total) by mouth 2 (two) times daily. - Oral    Notes to Pharmacy: Patient MUST schedule an OV with Dr. Stanford Breed for further refills.    E-Prescribing Status: Receipt confirmed by pharmacy (10/28/2014 12:16 PM EDT)     Pharmacy    [facility] [place] [facility], [address]

## 2015-01-13 ENCOUNTER — Other Ambulatory Visit: Payer: Self-pay | Admitting: Cardiology

## 2015-01-13 MED ORDER — CARVEDILOL 12.5 MG PO TABS: 12.5000 mg | ORAL_TABLET | Freq: Two times a day (BID) | ORAL | Status: DC

## 2015-01-16 ENCOUNTER — Other Ambulatory Visit: Payer: Self-pay | Admitting: Family Medicine

## 2015-01-20 ENCOUNTER — Other Ambulatory Visit: Payer: Self-pay

## 2015-01-20 NOTE — Telephone Encounter (Signed)
Approved      Disp Refills Start End    carvedilol (COREG) 12.5 MG tablet 180 tablet 0 01/13/2015     Sig - Route:  Take 1 tablet (12.5 mg total) by mouth 2 (two) times daily. - Oral    Class:  Normal    DAW:  No    Authorizing Provider:  Lelon Perla, MD    Ordering User:  [place] [id number]

## 2015-02-03 ENCOUNTER — Other Ambulatory Visit (HOSPITAL_COMMUNITY): Payer: Medicare PPO

## 2015-02-11 ENCOUNTER — Encounter (HOSPITAL_COMMUNITY)
Admission: RE | Admit: 2015-02-11 | Discharge: 2015-02-11 | Disposition: A | Discharge status: Home / Self Care | Payer: Medicare PPO | Source: Ambulatory Visit | Attending: Neurosurgery | Admitting: Neurosurgery

## 2015-02-11 ENCOUNTER — Encounter (HOSPITAL_COMMUNITY): Payer: Self-pay

## 2015-02-11 DIAGNOSIS — M5126 Other intervertebral disc displacement, lumbar region: Secondary | ICD-10-CM | POA: Insufficient documentation

## 2015-02-11 DIAGNOSIS — I110 Hypertensive heart disease with heart failure: Secondary | ICD-10-CM | POA: Diagnosis not present

## 2015-02-11 DIAGNOSIS — E209 Hypoparathyroidism, unspecified: Secondary | ICD-10-CM | POA: Diagnosis not present

## 2015-02-11 DIAGNOSIS — J439 Emphysema, unspecified: Secondary | ICD-10-CM | POA: Diagnosis not present

## 2015-02-11 DIAGNOSIS — I509 Heart failure, unspecified: Secondary | ICD-10-CM | POA: Diagnosis not present

## 2015-02-11 DIAGNOSIS — I714 Abdominal aortic aneurysm, without rupture: Secondary | ICD-10-CM | POA: Diagnosis not present

## 2015-02-11 DIAGNOSIS — Z951 Presence of aortocoronary bypass graft: Secondary | ICD-10-CM | POA: Diagnosis not present

## 2015-02-11 DIAGNOSIS — Z7982 Long term (current) use of aspirin: Secondary | ICD-10-CM | POA: Insufficient documentation

## 2015-02-11 DIAGNOSIS — Z01812 Encounter for preprocedural laboratory examination: Secondary | ICD-10-CM | POA: Diagnosis not present

## 2015-02-11 DIAGNOSIS — G4733 Obstructive sleep apnea (adult) (pediatric): Secondary | ICD-10-CM | POA: Insufficient documentation

## 2015-02-11 DIAGNOSIS — Z79899 Other long term (current) drug therapy: Secondary | ICD-10-CM | POA: Insufficient documentation

## 2015-02-11 DIAGNOSIS — E785 Hyperlipidemia, unspecified: Secondary | ICD-10-CM | POA: Insufficient documentation

## 2015-02-11 DIAGNOSIS — Z87891 Personal history of nicotine dependence: Secondary | ICD-10-CM | POA: Insufficient documentation

## 2015-02-11 DIAGNOSIS — Z01818 Encounter for other preprocedural examination: Secondary | ICD-10-CM | POA: Diagnosis not present

## 2015-02-11 DIAGNOSIS — I2510 Atherosclerotic heart disease of native coronary artery without angina pectoris: Secondary | ICD-10-CM | POA: Insufficient documentation

## 2015-02-11 HISTORY — DX: Adverse effect of unspecified anesthetic, initial encounter: T41.45XA

## 2015-02-11 HISTORY — DX: Pneumonia, unspecified organism: J18.9

## 2015-02-11 HISTORY — DX: Other complications of anesthesia, initial encounter: T88.59XA

## 2015-02-11 LAB — BASIC METABOLIC PANEL
Anion gap: 7 (ref 5–15)
BUN: 16 mg/dL (ref 6–20)
CO2: 27 mmol/L (ref 22–32)
Calcium: 9.6 mg/dL (ref 8.9–10.3)
Chloride: 106 mmol/L (ref 101–111)
Creatinine, Ser: 0.78 mg/dL (ref 0.61–1.24)
GLUCOSE: 127 mg/dL — AB (ref 65–99)
Potassium: 4.5 mmol/L (ref 3.5–5.1)
SODIUM: 140 mmol/L (ref 135–145)

## 2015-02-11 LAB — CBC
HCT: 45.2 % (ref 39.0–52.0)
Hemoglobin: 15 g/dL (ref 13.0–17.0)
MCH: 30.1 pg (ref 26.0–34.0)
MCHC: 33.2 g/dL (ref 30.0–36.0)
MCV: 90.6 fL (ref 78.0–100.0)
PLATELETS: 160 10*3/uL (ref 150–400)
RBC: 4.99 MIL/uL (ref 4.22–5.81)
RDW: 13.3 % (ref 11.5–15.5)
WBC: 8.3 10*3/uL (ref 4.0–10.5)

## 2015-02-11 LAB — SURGICAL PCR SCREEN
MRSA, PCR: NEGATIVE
Staphylococcus aureus: POSITIVE — AB

## 2015-02-11 NOTE — Progress Notes (Signed)
Nurse called Manuela Schwartz at Dr. Lacy Duverney office to question about wording on consent form, as consent did not mention L5, only L4 microdiskectomy. Manuela Schwartz stated that he consent should have L4 and L5 on it. Will reenter orders for consent.

## 2015-02-11 NOTE — Progress Notes (Signed)
PCP is Beatrice Lecher  Cardiologist is Kirk Ruths  Patient denied having any acute cardiac or pulmonary issues  Patients wife at chair side during PAT visit

## 2015-02-11 NOTE — Pre-Procedure Instructions (Signed)
IYAD DEROO  02/11/2015     Your procedure is scheduled on : Wednesday February 18, 2015 at 9:30 AM.  Report to [facility] Admitting at 6:30 AM.  Call this number if you have problems the morning of surgery: [phone]    Remember:  Do not eat food or drink liquids after midnight.  Take these medicines the morning of surgery with A SIP OF WATER : Acetaminophen (Tylenol) if needed, Carvedilol (Coreg), Symbicort inhaler   Stop taking any aspirin, herbal medications, vitamins, Ibuprofen, Advil, Motrin, Aleve, etc   Do not wear jewelry.  Do not wear lotions, powders, or cologne.    Men may shave face and neck.  Do not bring valuables to the hospital.  [facility] is not responsible for any belongings or valuables.  Contacts, dentures or bridgework may not be worn into surgery.  Leave your suitcase in the car.  After surgery it may be brought to your room.  For patients admitted to the hospital, discharge time will be determined by your treatment team.  Patients discharged the day of surgery will not be allowed to drive home.   Name and phone number of your driver:    Special instructions:  Shower using CHG soap the night before and the morning of your surgery  Please read over the following fact sheets that you were given. Pain Booklet, Coughing and Deep Breathing, MRSA Information and Surgical Site Infection Prevention

## 2015-02-13 NOTE — Progress Notes (Signed)
Anesthesia Chart Review:  Pt is 82 year old male scheduled for L4-5 lumbar laminectomy/ decompression microdiscectomy on 02/18/2015 with Dr. Christella Noa.   Cardiologist is Dr. Kirk Ruths.   PMH includes:  CAD (CABG 1994 (LIMA-LAD); s/p redo CABG 08/14/12 (SVG to ramus and SVG to PDA)), NSVT, CHF, HTN, emphysema, OSA (on CPAP), AAA, hyperlipidemia, hypoparathyroidism. Former smoker. BMI 34.    Anesthesia history: pt reports it effected his memory. "I couldn't remember anything for three months...just bits and pieces"  Medications include: ASA, carvedilol, lasix, losartan, crestor, symbicort  Preoperative labs reviewed.    EKG 06/11/14: NSR. RBBB.   Retroperitoneal [place] 06/23/14: Infrarenal abdominal aortic aneurysm measuring 4.2 x 3.7 cm.  Echo 08/05/13:  - Left ventricle: The cavity size was normal. Wall thickness was increased in a pattern of mild LVH. Systolic function was mildly to moderately reduced. The estimated ejection fraction was in the range of 40% to 45%. - Right ventricle: The cavity size was mildly dilated. Systolic function was mildly reduced. - Right atrium: The atrium was mildly dilated.  Cardiac cath 07/30/12:  1. Severe left main and three-vessel obstructive coronary artery disease.  2. Patent LIMA graft to the LAD 3. Moderate left ventricular dysfunction.  Pt has cardiac clearance from Dr. Stanford Breed in [place] telephone encounter dated 12/30/14.   If no changes, I anticipate pt can proceed with surgery as scheduled.   Willeen Cass, FNP-BC [facility]/Anesthesiology Phone: [phone] 02/13/2015 1:27 PM

## 2015-02-17 ENCOUNTER — Other Ambulatory Visit: Payer: Self-pay | Admitting: Family Medicine

## 2015-02-17 MED ORDER — CEFAZOLIN SODIUM-DEXTROSE 2-3 GM-% IV SOLR
2.0000 g | INTRAVENOUS | Status: AC
  Administered 2015-02-18: 2 g via INTRAVENOUS
  Filled 2015-02-17: qty 50

## 2015-02-18 ENCOUNTER — Ambulatory Visit (HOSPITAL_COMMUNITY): Payer: Medicare PPO

## 2015-02-18 ENCOUNTER — Ambulatory Visit (HOSPITAL_COMMUNITY): Payer: Medicare PPO | Admitting: Critical Care Medicine

## 2015-02-18 ENCOUNTER — Encounter (HOSPITAL_COMMUNITY): Payer: Self-pay | Admitting: General Practice

## 2015-02-18 ENCOUNTER — Ambulatory Visit (HOSPITAL_COMMUNITY): Payer: Medicare PPO | Admitting: Emergency Medicine

## 2015-02-18 ENCOUNTER — Encounter (HOSPITAL_COMMUNITY)
Admission: RE | Disposition: A | Discharge status: Home / Self Care | Payer: Self-pay | Source: Ambulatory Visit | Attending: Neurosurgery

## 2015-02-18 ENCOUNTER — Ambulatory Visit (HOSPITAL_COMMUNITY)
Admission: RE | Admit: 2015-02-18 | Discharge: 2015-02-18 | Disposition: A | Discharge status: Home / Self Care | Payer: Medicare PPO | Source: Ambulatory Visit | Attending: Neurosurgery | Admitting: Neurosurgery

## 2015-02-18 DIAGNOSIS — Z6833 Body mass index (BMI) 33.0-33.9, adult: Secondary | ICD-10-CM | POA: Insufficient documentation

## 2015-02-18 DIAGNOSIS — I509 Heart failure, unspecified: Secondary | ICD-10-CM | POA: Diagnosis not present

## 2015-02-18 DIAGNOSIS — J449 Chronic obstructive pulmonary disease, unspecified: Secondary | ICD-10-CM | POA: Insufficient documentation

## 2015-02-18 DIAGNOSIS — I2510 Atherosclerotic heart disease of native coronary artery without angina pectoris: Secondary | ICD-10-CM | POA: Insufficient documentation

## 2015-02-18 DIAGNOSIS — Z9889 Other specified postprocedural states: Secondary | ICD-10-CM | POA: Diagnosis not present

## 2015-02-18 DIAGNOSIS — Z419 Encounter for procedure for purposes other than remedying health state, unspecified: Secondary | ICD-10-CM

## 2015-02-18 DIAGNOSIS — I252 Old myocardial infarction: Secondary | ICD-10-CM | POA: Insufficient documentation

## 2015-02-18 DIAGNOSIS — Z955 Presence of coronary angioplasty implant and graft: Secondary | ICD-10-CM | POA: Diagnosis not present

## 2015-02-18 DIAGNOSIS — J45909 Unspecified asthma, uncomplicated: Secondary | ICD-10-CM | POA: Insufficient documentation

## 2015-02-18 DIAGNOSIS — M5126 Other intervertebral disc displacement, lumbar region: Secondary | ICD-10-CM | POA: Diagnosis present

## 2015-02-18 DIAGNOSIS — Z87891 Personal history of nicotine dependence: Secondary | ICD-10-CM | POA: Insufficient documentation

## 2015-02-18 DIAGNOSIS — I10 Essential (primary) hypertension: Secondary | ICD-10-CM | POA: Diagnosis not present

## 2015-02-18 HISTORY — PX: LUMBAR LAMINECTOMY/DECOMPRESSION MICRODISCECTOMY: SHX5026

## 2015-02-18 SURGERY — LUMBAR LAMINECTOMY/DECOMPRESSION MICRODISCECTOMY 1 LEVEL
Anesthesia: General | Laterality: Left

## 2015-02-18 MED ORDER — LACTATED RINGERS IV SOLN
INTRAVENOUS | Status: DC
  Administered 2015-02-18: 08:00:00 via INTRAVENOUS

## 2015-02-18 MED ORDER — BUPIVACAINE HCL 0.5 % IJ SOLN
INTRAMUSCULAR | Status: DC | PRN
  Administered 2015-02-18: 30 mL

## 2015-02-18 MED ORDER — OXYCODONE-ACETAMINOPHEN 5-325 MG PO TABS: 1.0000 | ORAL_TABLET | ORAL | Status: DC | PRN

## 2015-02-18 MED ORDER — ASPIRIN 81 MG PO TABS: 81.0000 mg | ORAL_TABLET | Freq: Every day | ORAL | Status: DC

## 2015-02-18 MED ORDER — HYDROMORPHONE HCL 1 MG/ML IJ SOLN: 0.5000 mg | INTRAMUSCULAR | Status: DC | PRN

## 2015-02-18 MED ORDER — ROCURONIUM BROMIDE 100 MG/10ML IV SOLN
INTRAVENOUS | Status: DC | PRN
  Administered 2015-02-18: 50 mg via INTRAVENOUS

## 2015-02-18 MED ORDER — POTASSIUM CHLORIDE IN NACL 20-0.9 MEQ/L-% IV SOLN
INTRAVENOUS | Status: DC
  Filled 2015-02-18 (×2): qty 1000

## 2015-02-18 MED ORDER — ACETAMINOPHEN 325 MG PO TABS: 650.0000 mg | ORAL_TABLET | ORAL | Status: DC | PRN

## 2015-02-18 MED ORDER — CYCLOBENZAPRINE HCL 10 MG PO TABS: 10.0000 mg | ORAL_TABLET | Freq: Three times a day (TID) | ORAL | Status: DC | PRN

## 2015-02-18 MED ORDER — BUDESONIDE-FORMOTEROL FUMARATE 160-4.5 MCG/ACT IN AERO
2.0000 | INHALATION_SPRAY | Freq: Two times a day (BID) | RESPIRATORY_TRACT | Status: DC
  Filled 2015-02-18: qty 6

## 2015-02-18 MED ORDER — OXYCODONE-ACETAMINOPHEN 5-325 MG PO TABS: 1.0000 | ORAL_TABLET | Freq: Four times a day (QID) | ORAL | Status: DC | PRN

## 2015-02-18 MED ORDER — BISACODYL 5 MG PO TBEC: 5.0000 mg | DELAYED_RELEASE_TABLET | Freq: Every day | ORAL | Status: DC | PRN

## 2015-02-18 MED ORDER — FENTANYL CITRATE (PF) 100 MCG/2ML IJ SOLN
INTRAMUSCULAR | Status: DC | PRN
  Administered 2015-02-18: 150 ug via INTRAVENOUS
  Administered 2015-02-18: 100 ug via INTRAVENOUS

## 2015-02-18 MED ORDER — ROSUVASTATIN CALCIUM 20 MG PO TABS
40.0000 mg | ORAL_TABLET | Freq: Every day | ORAL | Status: DC
  Filled 2015-02-18: qty 2

## 2015-02-18 MED ORDER — 0.9 % SODIUM CHLORIDE (POUR BTL) OPTIME
TOPICAL | Status: DC | PRN
  Administered 2015-02-18: 1000 mL

## 2015-02-18 MED ORDER — PROPOFOL 10 MG/ML IV BOLUS
INTRAVENOUS | Status: DC | PRN
  Administered 2015-02-18: 140 mg via INTRAVENOUS

## 2015-02-18 MED ORDER — ACETAMINOPHEN 325 MG PO TABS: 325.0000 mg | ORAL_TABLET | ORAL | Status: DC | PRN

## 2015-02-18 MED ORDER — PROPOFOL 10 MG/ML IV BOLUS
INTRAVENOUS | Status: AC
  Filled 2015-02-18: qty 20

## 2015-02-18 MED ORDER — ZOLPIDEM TARTRATE 5 MG PO TABS: 5.0000 mg | ORAL_TABLET | Freq: Every evening | ORAL | Status: DC | PRN

## 2015-02-18 MED ORDER — SODIUM CHLORIDE 0.9 % IJ SOLN: 3.0000 mL | Freq: Two times a day (BID) | INTRAMUSCULAR | Status: DC

## 2015-02-18 MED ORDER — CARVEDILOL 6.25 MG PO TABS
12.5000 mg | ORAL_TABLET | Freq: Two times a day (BID) | ORAL | Status: DC
  Filled 2015-02-18: qty 2

## 2015-02-18 MED ORDER — ADULT MULTIVITAMIN W/MINERALS CH
1.0000 | ORAL_TABLET | Freq: Every day | ORAL | Status: DC
Start: 2015-02-18 — End: 2015-02-18
  Filled 2015-02-18: qty 1

## 2015-02-18 MED ORDER — HYDROCODONE-ACETAMINOPHEN 5-325 MG PO TABS
1.0000 | ORAL_TABLET | ORAL | Status: DC | PRN
  Administered 2015-02-18: 2 via ORAL
  Filled 2015-02-18: qty 2

## 2015-02-18 MED ORDER — KETOROLAC TROMETHAMINE 30 MG/ML IJ SOLN
15.0000 mg | Freq: Four times a day (QID) | INTRAMUSCULAR | Status: DC
  Administered 2015-02-18: 15 mg via INTRAVENOUS
  Filled 2015-02-18: qty 1

## 2015-02-18 MED ORDER — OXYCODONE HCL 5 MG PO TABS: 5.0000 mg | ORAL_TABLET | Freq: Once | ORAL | Status: DC | PRN

## 2015-02-18 MED ORDER — SUGAMMADEX SODIUM 500 MG/5ML IV SOLN
INTRAVENOUS | Status: DC | PRN
  Administered 2015-02-18: 202.4 mg via INTRAVENOUS

## 2015-02-18 MED ORDER — HEMOSTATIC AGENTS (NO CHARGE) OPTIME
TOPICAL | Status: DC | PRN
  Administered 2015-02-18: 1 via TOPICAL

## 2015-02-18 MED ORDER — ONDANSETRON HCL 4 MG/2ML IJ SOLN: 4.0000 mg | INTRAMUSCULAR | Status: DC | PRN

## 2015-02-18 MED ORDER — LIDOCAINE-EPINEPHRINE 0.5 %-1:200000 IJ SOLN
INTRAMUSCULAR | Status: DC | PRN
  Administered 2015-02-18: 10 mL

## 2015-02-18 MED ORDER — THROMBIN 5000 UNITS EX SOLR
CUTANEOUS | Status: DC | PRN
  Administered 2015-02-18 (×2): 5000 [IU] via TOPICAL

## 2015-02-18 MED ORDER — SENNA 8.6 MG PO TABS
1.0000 | ORAL_TABLET | Freq: Two times a day (BID) | ORAL | Status: DC
  Filled 2015-02-18: qty 1

## 2015-02-18 MED ORDER — LIDOCAINE HCL (CARDIAC) 20 MG/ML IV SOLN
INTRAVENOUS | Status: DC | PRN
  Administered 2015-02-18: 60 mg via INTRAVENOUS

## 2015-02-18 MED ORDER — ASPIRIN 81 MG PO CHEW
81.0000 mg | CHEWABLE_TABLET | Freq: Every day | ORAL | Status: DC
Start: 2015-02-18 — End: 2015-02-18
  Filled 2015-02-18: qty 1

## 2015-02-18 MED ORDER — SUGAMMADEX SODIUM 500 MG/5ML IV SOLN
INTRAVENOUS | Status: AC
  Filled 2015-02-18: qty 5

## 2015-02-18 MED ORDER — FUROSEMIDE 40 MG PO TABS
40.0000 mg | ORAL_TABLET | Freq: Every day | ORAL | Status: DC
  Filled 2015-02-18: qty 1

## 2015-02-18 MED ORDER — OXYCODONE HCL 5 MG/5ML PO SOLN: 5.0000 mg | Freq: Once | ORAL | Status: DC | PRN

## 2015-02-18 MED ORDER — LACTATED RINGERS IV SOLN
INTRAVENOUS | Status: DC | PRN
  Administered 2015-02-18 (×2): via INTRAVENOUS

## 2015-02-18 MED ORDER — SENNOSIDES-DOCUSATE SODIUM 8.6-50 MG PO TABS: 1.0000 | ORAL_TABLET | Freq: Every evening | ORAL | Status: DC | PRN

## 2015-02-18 MED ORDER — ACETAMINOPHEN 160 MG/5ML PO SOLN: 325.0000 mg | ORAL | Status: DC | PRN

## 2015-02-18 MED ORDER — SODIUM CHLORIDE 0.9 % IJ SOLN: 3.0000 mL | INTRAMUSCULAR | Status: DC | PRN

## 2015-02-18 MED ORDER — SODIUM CHLORIDE 0.9 % IV SOLN: 250.0000 mL | INTRAVENOUS | Status: DC

## 2015-02-18 MED ORDER — VITAMIN B-12 100 MCG PO TABS
100.0000 ug | ORAL_TABLET | Freq: Every day | ORAL | Status: DC
  Filled 2015-02-18: qty 1

## 2015-02-18 MED ORDER — ONDANSETRON HCL 4 MG/2ML IJ SOLN
INTRAMUSCULAR | Status: DC | PRN
  Administered 2015-02-18: 4 mg via INTRAVENOUS

## 2015-02-18 MED ORDER — MENTHOL 3 MG MT LOZG: 1.0000 | LOZENGE | OROMUCOSAL | Status: DC | PRN

## 2015-02-18 MED ORDER — FINASTERIDE 5 MG PO TABS
5.0000 mg | ORAL_TABLET | Freq: Every day | ORAL | Status: DC
  Filled 2015-02-18: qty 1

## 2015-02-18 MED ORDER — ACETAMINOPHEN 650 MG RE SUPP: 650.0000 mg | RECTAL | Status: DC | PRN

## 2015-02-18 MED ORDER — LOSARTAN POTASSIUM 50 MG PO TABS
50.0000 mg | ORAL_TABLET | Freq: Every day | ORAL | Status: DC
  Filled 2015-02-18: qty 1

## 2015-02-18 MED ORDER — MULTIVITAMINS PO CAPS: 1.0000 | ORAL_CAPSULE | Freq: Every day | ORAL | Status: DC

## 2015-02-18 MED ORDER — PHENOL 1.4 % MT LIQD: 1.0000 | OROMUCOSAL | Status: DC | PRN

## 2015-02-18 MED ORDER — MAGNESIUM CITRATE PO SOLN
1.0000 | Freq: Once | ORAL | Status: DC | PRN
  Filled 2015-02-18: qty 296

## 2015-02-18 MED ORDER — HYDROMORPHONE HCL 1 MG/ML IJ SOLN: 0.2500 mg | INTRAMUSCULAR | Status: DC | PRN

## 2015-02-18 MED ORDER — FENTANYL CITRATE (PF) 250 MCG/5ML IJ SOLN
INTRAMUSCULAR | Status: AC
  Filled 2015-02-18: qty 5

## 2015-02-18 MED ORDER — VITAMIN C 500 MG PO TABS
1000.0000 mg | ORAL_TABLET | Freq: Every day | ORAL | Status: DC
  Filled 2015-02-18: qty 2

## 2015-02-18 MED ORDER — VITAMIN D 1000 UNITS PO TABS
3000.0000 [IU] | ORAL_TABLET | Freq: Every day | ORAL | Status: DC
  Filled 2015-02-18: qty 3

## 2015-02-18 SURGICAL SUPPLY — 49 items
BAG DECANTER FOR FLEXI CONT (MISCELLANEOUS) ×3 IMPLANT
BENZOIN TINCTURE PRP APPL 2/3 (GAUZE/BANDAGES/DRESSINGS) IMPLANT
BLADE CLIPPER SURG (BLADE) IMPLANT
BUR MATCHSTICK NEURO 3.0 LAGG (BURR) ×3 IMPLANT
CANISTER SUCT 3000ML PPV (MISCELLANEOUS) ×3 IMPLANT
CLOSURE WOUND 1/2 X4 (GAUZE/BANDAGES/DRESSINGS)
DECANTER SPIKE VIAL GLASS SM (MISCELLANEOUS) ×3 IMPLANT
DRAPE LAPAROTOMY 100X72X124 (DRAPES) ×3 IMPLANT
DRAPE MICROSCOPE LEICA (MISCELLANEOUS) ×3 IMPLANT
DRAPE POUCH INSTRU U-SHP 10X18 (DRAPES) ×3 IMPLANT
DRAPE SURG 17X23 STRL (DRAPES) ×3 IMPLANT
DURAPREP 26ML APPLICATOR (WOUND CARE) ×3 IMPLANT
ELECT REM PT RETURN 9FT ADLT (ELECTROSURGICAL) ×3
ELECTRODE REM PT RTRN 9FT ADLT (ELECTROSURGICAL) ×1 IMPLANT
GAUZE SPONGE 4X4 12PLY STRL (GAUZE/BANDAGES/DRESSINGS) IMPLANT
GAUZE SPONGE 4X4 16PLY XRAY LF (GAUZE/BANDAGES/DRESSINGS) IMPLANT
GLOVE ECLIPSE 6.5 STRL STRAW (GLOVE) ×3 IMPLANT
GLOVE EXAM NITRILE LRG STRL (GLOVE) IMPLANT
GLOVE EXAM NITRILE MD LF STRL (GLOVE) IMPLANT
GLOVE EXAM NITRILE XL STR (GLOVE) IMPLANT
GLOVE EXAM NITRILE XS STR PU (GLOVE) IMPLANT
GLOVE INDICATOR 7.0 STRL GRN (GLOVE) ×9 IMPLANT
GLOVE INDICATOR 7.5 STRL GRN (GLOVE) ×3 IMPLANT
GLOVE SS BIOGEL STRL SZ 7 (GLOVE) ×1 IMPLANT
GLOVE SUPERSENSE BIOGEL SZ 7 (GLOVE) ×2
GOWN STRL REUS W/ TWL LRG LVL3 (GOWN DISPOSABLE) ×4 IMPLANT
GOWN STRL REUS W/ TWL XL LVL3 (GOWN DISPOSABLE) IMPLANT
GOWN STRL REUS W/TWL 2XL LVL3 (GOWN DISPOSABLE) IMPLANT
GOWN STRL REUS W/TWL LRG LVL3 (GOWN DISPOSABLE) ×8
GOWN STRL REUS W/TWL XL LVL3 (GOWN DISPOSABLE)
KIT BASIN OR (CUSTOM PROCEDURE TRAY) ×3 IMPLANT
KIT ROOM TURNOVER OR (KITS) ×3 IMPLANT
LIQUID BAND (GAUZE/BANDAGES/DRESSINGS) ×3 IMPLANT
NEEDLE HYPO 25X1 1.5 SAFETY (NEEDLE) ×3 IMPLANT
NEEDLE SPNL 18GX3.5 QUINCKE PK (NEEDLE) ×3 IMPLANT
NS IRRIG 1000ML POUR BTL (IV SOLUTION) ×3 IMPLANT
PACK LAMINECTOMY NEURO (CUSTOM PROCEDURE TRAY) ×3 IMPLANT
PAD ARMBOARD 7.5X6 YLW CONV (MISCELLANEOUS) ×9 IMPLANT
RUBBERBAND STERILE (MISCELLANEOUS) ×6 IMPLANT
SPONGE LAP 4X18 X RAY DECT (DISPOSABLE) IMPLANT
SPONGE SURGIFOAM ABS GEL SZ50 (HEMOSTASIS) ×3 IMPLANT
STRIP CLOSURE SKIN 1/2X4 (GAUZE/BANDAGES/DRESSINGS) IMPLANT
SUT VIC AB 0 CT1 18XCR BRD8 (SUTURE) ×1 IMPLANT
SUT VIC AB 0 CT1 8-18 (SUTURE) ×2
SUT VIC AB 2-0 CT1 18 (SUTURE) ×3 IMPLANT
SUT VIC AB 3-0 SH 8-18 (SUTURE) ×3 IMPLANT
TOWEL OR 17X24 6PK STRL BLUE (TOWEL DISPOSABLE) ×3 IMPLANT
TOWEL OR 17X26 10 PK STRL BLUE (TOWEL DISPOSABLE) ×3 IMPLANT
WATER STERILE IRR 1000ML POUR (IV SOLUTION) ×3 IMPLANT

## 2015-02-18 NOTE — Anesthesia Procedure Notes (Signed)
Procedure Name: Intubation Date/Time: 02/18/2015 12:07 PM Performed by: Eligha Bridegroom Pre-anesthesia Checklist: Emergency Drugs available, Patient identified, Timeout performed, Suction available and Patient being monitored Patient Re-evaluated:Patient Re-evaluated prior to inductionOxygen Delivery Method: Circle system utilized Preoxygenation: Pre-oxygenation with 100% oxygen Intubation Type: IV induction Ventilation: Mask ventilation without difficulty and Oral airway inserted - appropriate to patient size Laryngoscope Size: Mac and 4 Grade View: Grade I Tube type: Oral Tube size: 7.5 mm Number of attempts: 1 Airway Equipment and Method: Stylet and LTA kit utilized Placement Confirmation: ETT inserted through vocal cords under direct vision,  breath sounds checked- equal and bilateral and positive ETCO2 Secured at: 22 cm Tube secured with: Tape Dental Injury: Teeth and Oropharynx as per pre-operative assessment

## 2015-02-18 NOTE — H&P (Signed)
BP 125/65 mmHg  Pulse 64  Temp(Src) 98.8 F (37.1 C) (Oral)  Resp 20  Ht '5\' 8"'$  (1.727 m)  Wt 101.152 kg (223 lb)  BMI 33.91 kg/m2  SpO2 95% Joseph Berg is a 82 year old gentleman who presents today for evaluation of pain that he has in his back and left lower extremity. He says he has had this pain since he underwent open heart surgery in 2014. While he has pain in his right lower extremity, that is significantly better than what he feels in his left lower extremity. He has undergone epidural injections for this pain and is due to have his third shot tomorrow. They give him relief for approximately two to three weeks, then it wears off. He says his quality of life since his heart operation has been absolutely zilch. He was a Administrator for 31 years. He does have pain pills prescribed, but does not like taking them because they make him very dizzy. His pain is significantly worse whenever he is walking and/or standing. Dr. Dianah Field did have him undergo ankle-brachial indices to ascertain whether or not he had vascular insufficiency, and indeed he does, but it is in the right lower extremity.  DATA: MRI of the lumbar spine shows a fairly good-sized disc herniation at L4-5, centered to the left, and it is big enough that it causes some stenosis but the foraminal narrowing and lateral recess stenosis is profound. I think this is the biggest reason for the pain he has in his left lower extremity. The radiologist mentions other levels, 5-1 and 3-4, but 4-5 is easily the worst and that is the only one I think that needs to be addressed.  INTERVAL PFSH: He is retired currently. Mother and father are both deceased. He does not smoke. He does drink alcohol socially. He has no history of substance abuse.  PAST MEDICAL HISTORY: Past medical history also includes hypertension, myocardial infarction, and asthma.  PAST SURGICAL HISTORY: He has undergone bypass surgery on two occasions, in 1994 and in 2014. He  describes pain being in his leg and back.  ALLERGIES: He has high calcium levels when he takes hydrochlorothiazide, but he has no allergies.  MEDICATIONS: Losartan, rosuvastatin, Lasix, finasteride, and Carvedilol.  REVIEW OF SYSTEMS: Review of systems is positive for eyeglasses, hypertension, hypercholesterolemia, and leg pain with walking. He denies constitutional, ear, nose, throat, mouth, respiratory, gastrointestinal, genitourinary, musculoskeletal, skin, neurological, psychiatric, endocrine, hematologic, and allergic problems. On his pain chart he lists pain in his back and left calf.  PHYSICAL EXAMINATION: Vital signs are as follows: Height 5 feet 8 inches, weight 224 pounds, blood pressure is 118/75, pulse is 97, respiratory rate is 16, temperature is 99.8, pain is 10/10 in his back. On physical exam, he is alert, oriented x4, and answering all questions appropriately. Memory, language, attention span, and fund of knowledge are normal. Speech is clear, it is also fluent. Right pupils is approximately 5 mm, the left pupil is 2 mm. They are reactive bilaterally. He does have full extraocular movements. Full visual fields. Hearing intact to voice bilaterally. Symmetric facial sensation and movement. Uvula elevates in the midline. Shoulder shrug is normal. Tongue protrudes in the midline. He has 2+ reflexes in the upper and lower extremities, downgoing toes to plantar simulation. No clonus. No Hoffman's sign. Proprioception is intact. He can walk on his toes. He can walk on his heels. He can do a very shallow squat.  IMPRESSION/PLAN: Displaced disc left L4-5, lumbar radiculopathy. Joseph Berg  at this point would like operative decompression, as he has tried everything else, he has had time, and he has not had relief. I propose a laminectomy and discectomy. Risks and benefits of bleeding, infection, no relief, need for further surgery, disc recurrence, damage to the nerve roots, bowel or bladder  dysfunction, along with other risks were discussed. He understands and wishes to proceed. We will try to get him scheduled as soon as we can secondary to his discomfort.

## 2015-02-18 NOTE — Progress Notes (Signed)
Discharge instructions/education/Rx given to patient with wife at bedside and they both verbalized understanding. Patient tolerated diner, voiding well and ambulated on the hallway with supervision. No swelling, no drainage,  no redness noted on incision site.

## 2015-02-18 NOTE — Transfer of Care (Signed)
Immediate Anesthesia Transfer of Care Note  Patient: Joseph Berg  Procedure(s) Performed: Procedure(s): LUMBAR LAMINECTOMY/DECOMPRESSION MICRODISCECTOMY- LEFT FOUR-FIVE  (Left)  Patient Location: PACU  Anesthesia Type:General  Level of Consciousness: awake, alert  and oriented  Airway & Oxygen Therapy: Patient Spontanous Breathing and Patient connected to nasal cannula oxygen  Post-op Assessment: Report given to RN and Post -op Vital signs reviewed and stable  Post vital signs: Reviewed and stable  Last Vitals:  Filed Vitals:   02/18/15 0750 02/18/15 1347  BP: 125/65   Pulse: 64   Temp: 37.1 C 36.5 C  Resp: 20     Complications: No apparent anesthesia complications

## 2015-02-18 NOTE — Discharge Instructions (Signed)
Lumbar Discectomy °Care After °A discectomy involves removal of discmaterial (the cartilage-like structures located between the bones of the back). It is done to relieve pressure on nerve roots. It can be used as a treatment for a back problem. The time in surgery depends on the findings in surgery and what is necessary to correct the problems. °HOME CARE INSTRUCTIONS  °· Check the cut (incision) made by the surgeon twice a day for signs of infection. Some signs of infection may include:  °· A foul smelling, greenish or yellowish discharge from the wound.  °· Increased pain.  °· Increased redness over the incision (operative) site.  °· The skin edges may separate.  °· Flu-like symptoms (problems).  °· A temperature above 101.5° F (38.6° C).  °· Change your bandages in about 24 to 36 hours following surgery or as directed.  °· You may shower tomrrow.  Avoid bathtubs, swimming pools and hot tubs for three weeks or until your incision has healed completely. °· Follow your doctor's instructions as to safe activities, exercises, and physical therapy.  °· Weight reduction may be beneficial if you are overweight.  °· Daily exercise is helpful to prevent the return of problems. Walking is permitted. You may use a treadmill without an incline. Cut down on activities and exercise if you have discomfort. You may also go up and down stairs as much as you can tolerate.  °· DO NOT lift anything heavier than 10 to 15 lbs. Avoid bending or twisting at the waist. Always bend your knees when lifting.  °· Maintain strength and range of motion as instructed.  °· Do not drive for 10 days, or as directed by your doctors. You may be a passenger . Lying back in the passenger seat may be more comfortable for you. Always wear a seatbelt.  °· Limit your sitting in a regular chair to 20 to 30 minutes at a time. There are no limitations for sitting in a recliner. You should lie down or walk in between sitting periods.  °· Only take  over-the-counter or prescription medicines for pain, discomfort, or fever as directed by your caregiver.  °SEEK MEDICAL CARE IF:  °· There is increased bleeding (more than a small spot) from the wound.  °· You notice redness, swelling, or increasing pain in the wound.  °· Pus is coming from wound.  °· You develop an unexplained oral temperature above 102° F (38.9° C) develops.  °· You notice a foul smell coming from the wound or dressing.  °· You have increasing pain in your wound.  °SEEK IMMEDIATE MEDICAL CARE IF:  °· You develop a rash.  °· You have difficulty breathing.  °· You develop any allergic problems to medicines given.  °Document Released: 12/30/2003 Document Revised: 01/13/2011 Document Reviewed: 04/19/2007 °ExitCare® Patient Information °

## 2015-02-18 NOTE — Op Note (Signed)
02/18/2015  2:04 PM  PATIENT:  Joseph Berg  82 y.o. male  PRE-OPERATIVE DIAGNOSIS:  lumbar herniated disc left L4/5 hnp  POST-OPERATIVE DIAGNOSIS:  lumbar herniated disc left L4/5  PROCEDURE:  Procedure(s): LUMBAR LAMINECTOMY/DECOMPRESSION MICRODISCECTOMY- LEFT FOUR-FIVE   SURGEON:   Surgeon(s): Ashok Pall, MD Kevan Ny Ditty, MD  ASSISTANTS:ditty, benjamin  ANESTHESIA:   general  EBL:  Total I/O In: 300 [I.V.:300] Out: -   BLOOD ADMINISTERED:none  CELL SAVER GIVEN:none  COUNT:per nursing  DRAINS: none   SPECIMEN:  No Specimen  DICTATION: [name] was taken to the operating room, intubated and placed under a general anesthetic without difficulty. He was positioned prone on a Wilson frame with all pressure points padded. His back was prepped and draped in a sterile manner. I opened the skin with a 10 blade and carried the dissection down to the thoracolumbar fascia. I used both sharp dissection and the monopolar cautery to expose the lamina of L4, and L5. I confirmed my location with an intraoperative xray.  I used the drill, Kerrison punches, and curettes to perform a semihemilaminectomy of L4. I used the punches to remove the ligamentum flavum to expose the thecal sac. I brought the microscope into the operative field and with Dr.Ditty's assistance we started our decompression of the spinal canal, thecal sac and L5 root(s). I cauterized epidural veins overlying the disc space then divided them sharply. I opened the disc space with a 15 blade and proceeded with the discectomy. I used pituitary rongeurs, curettes, and other instruments to remove disc material. After the discectomy was completed I inspected the L5 nerve root and felt it was well decompressed. I explored rostrally, laterally, medially, and caudally and was satisfied with the decompression. I irrigated the wound, then closed in layers. I approximated the thoracolumbar fascia, subcutaneous, and  subcuticular planes with vicryl sutures. I used dermabond for a sterile dressing.   PLAN OF CARE: Admit for overnight observation  PATIENT DISPOSITION:  PACU - hemodynamically stable.   Delay start of Pharmacological VTE agent (>24hrs) due to surgical blood loss or risk of bleeding:  yes

## 2015-02-18 NOTE — Discharge Summary (Signed)
  Physician Discharge Summary  Patient ID: Joseph Berg MRN: [id number] DOB/AGE: [birth year] 82 y.o.  Admit date: 02/18/2015 Discharge date: 02/18/2015  Admission Diagnoses: HNP Left L4/5  Discharge Diagnoses: same Active Problems:   HNP (herniated nucleus pulposus), lumbar   Discharged Condition: good  Hospital Course: [name] was admitted and taken to the operating room for a straight forward lumbar discetomy. Post op he has voided, ambulated and tolerated a regualr diet. His wound is clean, dry, and without signs of infection.   Treatments: surgery: L4 semihemilaminectomy and discetomy L4/5  Discharge Exam: Blood pressure 154/75, pulse 81, temperature 97.6 F (36.4 C), temperature source Oral, resp. rate 18, height '5\' 8"'$  (1.727 m), weight 101.152 kg (223 lb), SpO2 94 %. General appearance: alert, cooperative, appears stated age and no distress Neurologic: Alert and oriented X 3, normal strength and tone. Normal symmetric reflexes. Normal coordination and gait  Disposition: 01-Home or Self Care lumbar herniated disc    Medication List    TAKE these medications        acetaminophen 500 MG tablet  Commonly known as:  TYLENOL  Take 1-2 tablets (500-1,000 mg total) by mouth every 6 (six) hours as needed for pain.     aspirin 81 MG tablet  Take 81 mg by mouth daily.     carvedilol 12.5 MG tablet  Commonly known as:  COREG  Take 1 tablet (12.5 mg total) by mouth 2 (two) times daily.     cholecalciferol 1000 units tablet  Commonly known as:  VITAMIN D  Take 3,000 Units by mouth daily.     cyclobenzaprine 10 MG tablet  Commonly known as:  FLEXERIL  Take 1 tablet (10 mg total) by mouth 3 (three) times daily as needed for muscle spasms.     finasteride 5 MG tablet  Commonly known as:  PROSCAR  TAKE ONE TABLET BY MOUTH ONE TIME DAILY     furosemide 40 MG tablet  Commonly known as:  LASIX  TAKE ONE TABLET BY MOUTH ONE TIME DAILY     losartan 50 MG tablet   Commonly known as:  COZAAR  TAKE ONE TABLET BY MOUTH ONE TIME DAILY     multivitamin capsule  Take 1 capsule by mouth daily.     oxyCODONE-acetaminophen 5-325 MG tablet  Commonly known as:  ROXICET  Take 1 tablet by mouth every 6 (six) hours as needed for severe pain.     rosuvastatin 40 MG tablet  Commonly known as:  CRESTOR  Take 1 tablet (40 mg total) by mouth daily.     SYMBICORT 160-4.5 MCG/ACT inhaler  Generic drug:  budesonide-formoterol  INHALE TWO PUFFS BY MOUTH TWICE DAILY     VITAMIN B-12 PO  Take 1 tablet by mouth daily.     vitamin C 1000 MG tablet  Take 1,000 mg by mouth daily.           Follow-up Information    Follow up with Elliona Doddridge L, MD In 3 weeks.   Specialty:  Neurosurgery   Why:  Please call the office   Contact information:   [address] [place] [address] [phone]       Signed: Winfield Cunas 02/18/2015, 8:05 PM

## 2015-02-18 NOTE — Anesthesia Preprocedure Evaluation (Signed)
Anesthesia Evaluation  Patient identified by MRN, date of birth, ID band Patient awake    Reviewed: Allergy & Precautions, NPO status , Patient's Chart, lab work & pertinent test results  History of Anesthesia Complications Negative for: history of anesthetic complications  Airway Mallampati: II  TM Distance: >3 FB Neck ROM: Full    Dental  (+) Poor Dentition, Upper Dentures   Pulmonary sleep apnea , COPD, former smoker,    breath sounds clear to auscultation       Cardiovascular hypertension, Pt. on medications and Pt. on home beta blockers + CAD, + Past MI, + CABG, + Peripheral Vascular Disease and +CHF   Rhythm:Regular     Neuro/Psych neg Seizures  Neuromuscular disease negative psych ROS   GI/Hepatic negative GI ROS, Neg liver ROS,   Endo/Other  Morbid obesity  Renal/GU negative Renal ROS     Musculoskeletal  (+) Arthritis ,   Abdominal   Peds  Hematology negative hematology ROS (+)   Anesthesia Other Findings   Reproductive/Obstetrics                             Anesthesia Physical Anesthesia Plan  ASA: III  Anesthesia Plan: General   Post-op Pain Management:    Induction: Intravenous  Airway Management Planned: Oral ETT  Additional Equipment: None  Intra-op Plan:   Post-operative Plan: Extubation in OR  Informed Consent: I have reviewed the patients History and Physical, chart, labs and discussed the procedure including the risks, benefits and alternatives for the proposed anesthesia with the patient or authorized representative who has indicated his/her understanding and acceptance.   Dental advisory given  Plan Discussed with: CRNA and Surgeon  Anesthesia Plan Comments:         Anesthesia Quick Evaluation

## 2015-02-19 ENCOUNTER — Encounter (HOSPITAL_COMMUNITY): Payer: Self-pay | Admitting: Neurosurgery

## 2015-02-19 NOTE — Anesthesia Postprocedure Evaluation (Signed)
Anesthesia Post Note  Patient: Joseph Berg  Procedure(s) Performed: Procedure(s) (LRB): LUMBAR LAMINECTOMY/DECOMPRESSION MICRODISCECTOMY- LEFT FOUR-FIVE  (Left)  Patient location during evaluation: PACU Anesthesia Type: General Level of consciousness: awake Pain management: pain level controlled Vital Signs Assessment: post-procedure vital signs reviewed and stable Respiratory status: respiratory function stable Cardiovascular status: stable Postop Assessment: no signs of nausea or vomiting Anesthetic complications: no    Last Vitals:  Filed Vitals:   02/18/15 1432 02/18/15 1949  BP: 139/72 154/75  Pulse: 99 81  Temp:  36.4 C  Resp: 18 18    Last Pain:  Filed Vitals:   02/18/15 1950  PainSc: [place]

## 2015-03-11 ENCOUNTER — Other Ambulatory Visit (HOSPITAL_BASED_OUTPATIENT_CLINIC_OR_DEPARTMENT_OTHER): Payer: Self-pay | Admitting: Neurosurgery

## 2015-03-11 ENCOUNTER — Telehealth: Payer: Self-pay

## 2015-03-11 DIAGNOSIS — G9389 Other specified disorders of brain: Secondary | ICD-10-CM | POA: Diagnosis not present

## 2015-03-11 DIAGNOSIS — J90 Pleural effusion, not elsewhere classified: Secondary | ICD-10-CM | POA: Diagnosis not present

## 2015-03-11 DIAGNOSIS — E785 Hyperlipidemia, unspecified: Secondary | ICD-10-CM | POA: Diagnosis not present

## 2015-03-11 DIAGNOSIS — I2510 Atherosclerotic heart disease of native coronary artery without angina pectoris: Secondary | ICD-10-CM | POA: Diagnosis not present

## 2015-03-11 DIAGNOSIS — Z951 Presence of aortocoronary bypass graft: Secondary | ICD-10-CM | POA: Diagnosis not present

## 2015-03-11 DIAGNOSIS — R4182 Altered mental status, unspecified: Secondary | ICD-10-CM | POA: Diagnosis not present

## 2015-03-11 DIAGNOSIS — Z87891 Personal history of nicotine dependence: Secondary | ICD-10-CM | POA: Diagnosis not present

## 2015-03-11 DIAGNOSIS — E669 Obesity, unspecified: Secondary | ICD-10-CM | POA: Diagnosis not present

## 2015-03-11 DIAGNOSIS — R918 Other nonspecific abnormal finding of lung field: Secondary | ICD-10-CM | POA: Diagnosis not present

## 2015-03-11 DIAGNOSIS — Z888 Allergy status to other drugs, medicaments and biological substances status: Secondary | ICD-10-CM | POA: Diagnosis not present

## 2015-03-11 DIAGNOSIS — R1110 Vomiting, unspecified: Secondary | ICD-10-CM | POA: Diagnosis not present

## 2015-03-11 DIAGNOSIS — R112 Nausea with vomiting, unspecified: Secondary | ICD-10-CM | POA: Diagnosis not present

## 2015-03-11 DIAGNOSIS — M5126 Other intervertebral disc displacement, lumbar region: Secondary | ICD-10-CM

## 2015-03-11 DIAGNOSIS — I10 Essential (primary) hypertension: Secondary | ICD-10-CM | POA: Diagnosis not present

## 2015-03-11 DIAGNOSIS — E860 Dehydration: Secondary | ICD-10-CM | POA: Diagnosis not present

## 2015-03-11 NOTE — Telephone Encounter (Signed)
Pt's wife called and said she thought her husband was dehydrated.  He has really decreased his fluid intake and doesn't seem coherent.  She wanted to know what she should do. I encouraged her to get her husband to the ER.

## 2015-03-21 ENCOUNTER — Ambulatory Visit (HOSPITAL_BASED_OUTPATIENT_CLINIC_OR_DEPARTMENT_OTHER)
Admission: RE | Admit: 2015-03-21 | Discharge: 2015-03-21 | Disposition: A | Discharge status: Home / Self Care | Payer: Medicare PPO | Source: Ambulatory Visit | Attending: Neurosurgery | Admitting: Neurosurgery

## 2015-03-21 DIAGNOSIS — I714 Abdominal aortic aneurysm, without rupture: Secondary | ICD-10-CM | POA: Insufficient documentation

## 2015-03-21 DIAGNOSIS — M5126 Other intervertebral disc displacement, lumbar region: Secondary | ICD-10-CM | POA: Insufficient documentation

## 2015-03-21 DIAGNOSIS — M4806 Spinal stenosis, lumbar region: Secondary | ICD-10-CM | POA: Diagnosis not present

## 2015-03-21 DIAGNOSIS — R531 Weakness: Secondary | ICD-10-CM | POA: Diagnosis not present

## 2015-03-21 MED ORDER — GADOBENATE DIMEGLUMINE 529 MG/ML IV SOLN: 20.0000 mL | Freq: Once | INTRAVENOUS | Status: DC | PRN

## 2015-03-26 ENCOUNTER — Ambulatory Visit (INDEPENDENT_AMBULATORY_CARE_PROVIDER_SITE_OTHER): Payer: Medicare PPO | Admitting: Osteopathic Medicine

## 2015-03-26 ENCOUNTER — Ambulatory Visit (INDEPENDENT_AMBULATORY_CARE_PROVIDER_SITE_OTHER): Payer: Medicare PPO

## 2015-03-26 ENCOUNTER — Encounter: Payer: Self-pay | Admitting: Osteopathic Medicine

## 2015-03-26 VITALS — BP 145/87 | HR 72 | Temp 98.4°F | Wt 211.0 lb

## 2015-03-26 DIAGNOSIS — J441 Chronic obstructive pulmonary disease with (acute) exacerbation: Secondary | ICD-10-CM | POA: Diagnosis not present

## 2015-03-26 DIAGNOSIS — R05 Cough: Secondary | ICD-10-CM | POA: Diagnosis not present

## 2015-03-26 DIAGNOSIS — R059 Cough, unspecified: Secondary | ICD-10-CM

## 2015-03-26 DIAGNOSIS — R509 Fever, unspecified: Secondary | ICD-10-CM | POA: Diagnosis not present

## 2015-03-26 DIAGNOSIS — J189 Pneumonia, unspecified organism: Secondary | ICD-10-CM | POA: Diagnosis not present

## 2015-03-26 LAB — CBC WITH DIFFERENTIAL/PLATELET
Basophils Absolute: 0 10*3/uL (ref 0.0–0.1)
Basophils Relative: 0 % (ref 0–1)
EOS PCT: 0 % (ref 0–5)
Eosinophils Absolute: 0 10*3/uL (ref 0.0–0.7)
HEMATOCRIT: 45.9 % (ref 39.0–52.0)
Hemoglobin: 15.6 g/dL (ref 13.0–17.0)
LYMPHS ABS: 1 10*3/uL (ref 0.7–4.0)
LYMPHS PCT: 8 % — AB (ref 12–46)
MCH: 30.1 pg (ref 26.0–34.0)
MCHC: 34 g/dL (ref 30.0–36.0)
MCV: 88.6 fL (ref 78.0–100.0)
MONO ABS: 0.9 10*3/uL (ref 0.1–1.0)
MONOS PCT: 7 % (ref 3–12)
MPV: 10.9 fL (ref 8.6–12.4)
Neutro Abs: 10.9 10*3/uL — ABNORMAL HIGH (ref 1.7–7.7)
Neutrophils Relative %: 85 % — ABNORMAL HIGH (ref 43–77)
Platelets: 154 10*3/uL (ref 150–400)
RBC: 5.18 MIL/uL (ref 4.22–5.81)
RDW: 13.6 % (ref 11.5–15.5)
WBC: 12.8 10*3/uL — ABNORMAL HIGH (ref 4.0–10.5)

## 2015-03-26 LAB — COMPLETE METABOLIC PANEL WITH GFR
ALBUMIN: 4.2 g/dL (ref 3.6–5.1)
ALK PHOS: 58 U/L (ref 40–115)
ALT: 22 U/L (ref 9–46)
AST: 24 U/L (ref 10–35)
BUN: 12 mg/dL (ref 7–25)
CALCIUM: 9.6 mg/dL (ref 8.6–10.3)
CHLORIDE: 102 mmol/L (ref 98–110)
CO2: 25 mmol/L (ref 20–31)
CREATININE: 0.88 mg/dL (ref 0.70–1.18)
GFR, Est Non African American: 82 mL/min (ref >=60)
Glucose, Bld: 145 mg/dL — ABNORMAL HIGH (ref 65–99)
POTASSIUM: 3.7 mmol/L (ref 3.5–5.3)
Sodium: 140 mmol/L (ref 135–146)
Total Bilirubin: 1.1 mg/dL (ref 0.2–1.2)
Total Protein: 6.5 g/dL (ref 6.1–8.1)

## 2015-03-26 MED ORDER — IPRATROPIUM-ALBUTEROL 0.5-2.5 (3) MG/3ML IN SOLN
3.0000 mL | Freq: Once | RESPIRATORY_TRACT | Status: AC
  Administered 2015-03-26: 3 mL via RESPIRATORY_TRACT

## 2015-03-26 MED ORDER — NEBULIZER MISC: Status: DC

## 2015-03-26 MED ORDER — GUAIFENESIN-CODEINE 100-10 MG/5ML PO SOLN: 5.0000 mL | Freq: Four times a day (QID) | ORAL | Status: DC | PRN

## 2015-03-26 MED ORDER — LEVOFLOXACIN 750 MG PO TABS: 750.0000 mg | ORAL_TABLET | Freq: Every day | ORAL | Status: DC

## 2015-03-26 MED ORDER — IPRATROPIUM-ALBUTEROL 0.5-2.5 (3) MG/3ML IN SOLN: 3.0000 mL | RESPIRATORY_TRACT | Status: DC | PRN

## 2015-03-26 MED ORDER — PREDNISONE 20 MG PO TABS: 20.0000 mg | ORAL_TABLET | Freq: Two times a day (BID) | ORAL | Status: DC

## 2015-03-26 NOTE — Patient Instructions (Signed)
Pneumonia, Adult Pneumonia is an infection of the lungs. CAUSES Pneumonia may be caused by bacteria, viruses, or funguses. Community-acquired pneumonia is often caused by Streptococcus pneumonia bacteria. These bacteria are often passed from one person to another by breathing in droplets from the cough or sneeze of an infected person. RISK FACTORS The condition is more likely to develop in:  People who havechronic diseases, such as chronic obstructive pulmonary disease (COPD), asthma, congestive heart failure, cystic fibrosis, diabetes, or kidney disease.  People who haveearly-stage or late-stage HIV.  People who havesickle cell disease.  People who havehad their spleen removed (splenectomy).  People who havepoor Human resources officer.  People who havemedical conditions that increase the risk of breathing in (aspirating) secretions their own mouth and nose.   People who havea weakened immune system (immunocompromised).  People who smoke.  People whotravel to areas where pneumonia-causing germs commonly exist.  People whoare around animal habitats or animals that have pneumonia-causing germs, including birds, bats, rabbits, cats, and farm animals. SYMPTOMS Symptoms of this condition include:  Adry cough.  A wet (productive) cough.  Fever.  Sweating.  Chest pain, especially when breathing deeply or coughing.  Rapid breathing or difficulty breathing.  Shortness of breath.  Shaking chills.  Fatigue.  Muscle aches. DIAGNOSIS Your health care provider will take a medical history and perform a physical exam. You may also have other tests, including:  Imaging studies of your chest, including X-rays.  Tests to check your blood oxygen level and other blood gases.  Other tests on blood, mucus (sputum), fluid around your lungs (pleural fluid), and urine. If your pneumonia is severe, other tests may be done to identify the specific cause of your  illness. TREATMENT The type of treatment that you receive depends on many factors, such as the cause of your pneumonia, the medicines you take, and other medical conditions that you have. For most adults, treatment and recovery from pneumonia may occur at home. In some cases, treatment must happen in a hospital. Treatment may include:  Antibiotic medicines, if the pneumonia was caused by bacteria.  Antiviral medicines, if the pneumonia was caused by a virus.  Medicines that are given by mouth or through an IV tube.  Oxygen.  Respiratory therapy. Although rare, treating severe pneumonia may include:  Mechanical ventilation. This is done if you are not breathing well on your own and you cannot maintain a safe blood oxygen level.  Thoracentesis. This procedureremoves fluid around one lung or both lungs to help you breathe better. HOME CARE INSTRUCTIONS  Take over-the-counter and prescription medicines only as told by your health care provider.  Only takecough medicine if you are losing sleep. Understand that cough medicine can prevent your body's natural ability to remove mucus from your lungs.  If you were prescribed an antibiotic medicine, take it as told by your health care provider. Do not stop taking the antibiotic even if you start to feel better.  Sleep in a semi-upright position at night. Try sleeping in a reclining chair, or place a few pillows under your head.  Do not use tobacco products, including cigarettes, chewing tobacco, and e-cigarettes. If you need help quitting, ask your health care provider.  Drink enough water to keep your urine clear or pale yellow. This will help to thin out mucus secretions in your lungs. PREVENTION There are ways that you can decrease your risk of developing community-acquired pneumonia. Consider getting a pneumococcal vaccine if:  You are older than 80 years of age.  You are older than 80 years of age and are undergoing cancer treatment,  have chronic lung disease, or have other medical conditions that affect your immune system. Ask your health care provider if this applies to you. There are different types and schedules of pneumococcal vaccines. Ask your health care provider which vaccination option is best for you. You may also prevent community-acquired pneumonia if you take these actions:  Get an influenza vaccine every year. Ask your health care provider which type of influenza vaccine is best for you.  Go to the dentist on a regular basis.  Wash your hands often. Use hand sanitizer if soap and water are not available. SEEK MEDICAL CARE IF:  You have a fever.  You are losing sleep because you cannot control your cough with cough medicine. SEEK IMMEDIATE MEDICAL CARE IF:  You have worsening shortness of breath.  You have increased chest pain.  Your sickness becomes worse, especially if you are an older adult or have a weakened immune system.  You cough up blood.   This information is not intended to replace advice given to you by your health care provider. Make sure you discuss any questions you have with your health care provider.   Document Released: 01/24/2005 Document Revised: 10/15/2014 Document Reviewed: 05/21/2014 Elsevier Interactive Patient Education [employer].

## 2015-03-26 NOTE — Progress Notes (Signed)
HPI: Joseph Berg is a 82 y.o. male who presents to [facility]  today for chief complaint of:  Chief Complaint  Patient presents with  . Cough   Patient is here for evaluation of cough/shortness of breath. He is coughing significantly, obvious increased work of breathing. . Location: chest . Quality: congestion, coughing . Severity: severe . Duration: 2 days . Context: yes sick contacts, s/p lumbar laminectomy x 1 month and also recently in ER for dehydration, no other recent travel.  . Modifying factors: has tried the following medications: no OTC meds, reports compliance with his Symbicort    Past medical, social and family history reviewed: Past Medical History  Diagnosis Date  . Heart attack ([place]) 1994  . Hypertension   . CAD (coronary artery disease)     a. s/p CABG x1 with LIMA-LAD 1994. b. LM & 3v CAD by cath 07/2012, pending CABG.  . Hypoparathyroidism ([place])   . BPH (benign prostatic hyperplasia)   . Hyperlipidemia   . CHF (congestive heart failure) ([place])     a. EF 35-40% by echo 07/2012.  [name] NSVT (nonsustained ventricular tachycardia) ([place])     a. Seen on tele 07/2012.  . Pseudoaneurysm of left femoral artery ([place])     a. post cath s/p compression 07/2012, associated w/ anemia.  . Emphysema     a. Moderate emphysema by CT 06/2012.  . Sciatica 2012    Qualifier: Diagnosis of  By: Madilyn Fireman MD, Barnetta Chapel    . Complication of anesthesia     affected patients memory. Pt stated "I couldn't remember anything for three months...just bits and pieces"  . Pneumonia   . OSA on CPAP     pt stated he does not wear CPAP because he no longer has sleep apnea   Past Surgical History  Procedure Laterality Date  . Cataract extraction, bilateral    . Intraoperative transesophageal echocardiogram N/A 08/14/2012    Procedure: INTRAOPERATIVE TRANSESOPHAGEAL ECHOCARDIOGRAM;  Surgeon: Gaye Pollack, MD;  Location: [facility] OR;  Service: Open Heart Surgery;   Laterality: N/A;  . Coronary artery bypass graft  03-23-92  . Coronary artery bypass graft N/A 08/14/2012    Procedure: REDO CORONARY ARTERY BYPASS GRAFTING (CABG);  Surgeon: Gaye Pollack, MD;  Location: [facility];  Service: Open Heart Surgery;  Laterality: N/A;  . Colonoscopy    . Lumbar laminectomy/decompression microdiscectomy Left 02/18/2015    Procedure: LUMBAR LAMINECTOMY/DECOMPRESSION MICRODISCECTOMY- LEFT FOUR-FIVE ;  Surgeon: Ashok Pall, MD;  Location: [facility];  Service: Neurosurgery;  Laterality: Left;   Social History  Substance Use Topics  . Smoking status: Former Smoker -- 1.00 packs/day for 60 years    Quit date: 10/08/2010  . Smokeless tobacco: Not on file  . Alcohol Use: Yes     Comment: Occasional   Family History  Problem Relation Age of Onset  . Heart attack Father 93  . Stroke Brother 44  . Hypertension Brother   . Hyperlipidemia Brother     Current Outpatient Prescriptions  Medication Sig Dispense Refill  . acetaminophen (TYLENOL) 500 MG tablet Take 1-2 tablets (500-1,000 mg total) by mouth every 6 (six) hours as needed for pain. 30 tablet 0  . Ascorbic Acid (VITAMIN C) 1000 MG tablet Take 1,000 mg by mouth daily.    [name] aspirin 81 MG tablet Take 81 mg by mouth daily.    . carvedilol (COREG) 12.5 MG tablet Take 1 tablet (12.5 mg total) by mouth  2 (two) times daily. 180 tablet 0  . cholecalciferol (VITAMIN D) 1000 UNITS tablet Take 3,000 Units by mouth daily.     . Cyanocobalamin (VITAMIN B-12 PO) Take 1 tablet by mouth daily.    . cyclobenzaprine (FLEXERIL) 10 MG tablet Take 1 tablet (10 mg total) by mouth 3 (three) times daily as needed for muscle spasms. 60 tablet 0  . finasteride (PROSCAR) 5 MG tablet TAKE ONE TABLET BY MOUTH ONE TIME DAILY 90 tablet 1  . furosemide (LASIX) 40 MG tablet TAKE ONE TABLET BY MOUTH ONE TIME DAILY 90 tablet 2  . losartan (COZAAR) 50 MG tablet TAKE ONE TABLET BY MOUTH ONE TIME DAILY 30 tablet 6  . Multiple Vitamin (MULTIVITAMIN)  capsule Take 1 capsule by mouth daily.    [name] oxyCODONE-acetaminophen (ROXICET) 5-325 MG tablet Take 1 tablet by mouth every 6 (six) hours as needed for severe pain. 70 tablet 0  . rosuvastatin (CRESTOR) 40 MG tablet Take 1 tablet (40 mg total) by mouth daily. 30 tablet 11  . SYMBICORT 160-4.5 MCG/ACT inhaler INHALE TWO PUFFS BY MOUTH TWICE DAILY 10.2 Inhaler 0  . [DISCONTINUED] metoprolol (TOPROL-XL) 100 MG 24 hr tablet Take 100 mg by mouth 2 (two) times daily.       No current facility-administered medications for this visit.   Allergies  Allergen Reactions  . Hctz [Hydrochlorothiazide] Other (See Comments)    Affected renal function.       Review of Systems: CONSTITUTIONAL: yes fever/chills HEAD/EYES/EARS/NOSE/THROAT: yes headache, no vision change or hearing change, yes sore throat CARDIAC: No chest pain/pressure/palpitations, no orthopnea, reports SOB with exercion but no chest pain RESPIRATORY: yes cough, yes shortness of breath, (+) productive cough  GASTROINTESTINAL: yes nausea, no vomiting, no abdominal pain/blood in stool/diarrhea/constipation MUSCULOSKELETAL: yes myalgia/arthralgia   Exam:  BP 145/87 mmHg  Pulse 72  Temp(Src) 98.4 F (36.9 C)  Wt 211 lb (95.709 kg)  SpO2 87% Constitutional: VSS, see above. General Appearance: alert, well-developed, well-nourished, NAD Eyes: Normal lids and conjunctive, non-icteric sclera, PERRLA Ears, Nose, Mouth, Throat: Normal external inspection ears/nares/mouth/lips/gums, normal TM, MMM; posterior pharynx without erythema, without exudate Neck: No masses, trachea midline. No thyroid enlargement/tenderness/mass appreciated, normal lymph nodes Respiratory: Increased respiratory effort. No  Wheeze/rhonchi/rales but lung sounds are markedly diminished bilaterally in all fields.  Cardiovascular: Heart sounds faint. S1/S2 normal, no murmur/rub/gallop auscultated. RRR. No lower extremity edema.   Results for orders placed or performed in  visit on 03/26/15 (from the past 72 hour(s))  CBC with Differential/Platelet     Status: Abnormal   Collection Time: 03/26/15 11:15 AM  Result Value Ref Range   WBC 12.8 (H) 4.0 - 10.5 K/uL   RBC 5.18 4.22 - 5.81 MIL/uL   Hemoglobin 15.6 13.0 - 17.0 g/dL   HCT 45.9 39.0 - 52.0 %   MCV 88.6 78.0 - 100.0 fL   MCH 30.1 26.0 - 34.0 pg   MCHC 34.0 30.0 - 36.0 g/dL   RDW 13.6 11.5 - 15.5 %   Platelets 154 150 - 400 K/uL   MPV 10.9 8.6 - 12.4 fL   Neutrophils Relative % 85 (H) 43 - 77 %   Neutro Abs 10.9 (H) 1.7 - 7.7 K/uL   Lymphocytes Relative 8 (L) 12 - 46 %   Lymphs Abs 1.0 0.7 - 4.0 K/uL   Monocytes Relative 7 3 - 12 %   Monocytes Absolute 0.9 0.1 - 1.0 K/uL   Eosinophils Relative 0 0 - 5 %  Eosinophils Absolute 0.0 0.0 - 0.7 K/uL   Basophils Relative 0 0 - 1 %   Basophils Absolute 0.0 0.0 - 0.1 K/uL   Smear Review Criteria for review not met   COMPLETE METABOLIC PANEL WITH GFR     Status: Abnormal   Collection Time: 03/26/15 11:15 AM  Result Value Ref Range   Sodium 140 135 - 146 mmol/L   Potassium 3.7 3.5 - 5.3 mmol/L   Chloride 102 98 - 110 mmol/L   CO2 25 20 - 31 mmol/L   Glucose, Bld 145 (H) 65 - 99 mg/dL   BUN 12 7 - 25 mg/dL   Creat 0.88 0.70 - 1.18 mg/dL   Total Bilirubin 1.1 0.2 - 1.2 mg/dL   Alkaline Phosphatase 58 40 - 115 U/L   AST 24 10 - 35 U/L   ALT 22 9 - 46 U/L   Total Protein 6.5 6.1 - 8.1 g/dL   Albumin 4.2 3.6 - 5.1 g/dL   Calcium 9.6 8.6 - 10.3 mg/dL   GFR, Est African American >89 >=60 mL/min   GFR, Est Non African American 82 >=60 mL/min    Comment:   The estimated GFR is a calculation valid for adults (>=15 years old) that uses the CKD-EPI algorithm to adjust for age and sex. It is   not to be used for children, pregnant women, hospitalized patients,    patients on dialysis, or with rapidly changing kidney function. According to the NKDEP, eGFR >89 is normal, 60-89 shows mild impairment, 30-59 shows moderate impairment, 15-29 shows  severe impairment and <15 is ESRD.       Dg Chest 2 View  03/26/2015  CLINICAL DATA:  Productive cough with fever for 1 week EXAM: CHEST  2 VIEW COMPARISON:  December 13, 2013 FINDINGS: There is chronic bibasilar interstitial prominence, likely due to scarring/fibrotic type change. There is chronic blunting of the left costophrenic angle. There is no new opacity. No frank edema or consolidation. Heart size and pulmonary vascularity are normal. No adenopathy. Patient is status post internal mammary bypass grafting. There is degenerative change in the thoracic spine. IMPRESSION: Scarring/fibrotic change in the lung bases, stable. Stable chronic blunting left costophrenic angle. No edema or consolidation. No change in cardiac silhouette. Electronically Signed   By: Lowella Grip III M.D.   On: 03/26/2015 13:31     ASSESSMENT/PLAN:   After discussion with patient and his wife, he states he really doesn't want to go to the emergency room or the hospital. I advised him I'm concerned about his low oxygen, his increased work of breathing, as well as his recent exposure to healthcare settings which could certainly predispose him to a health care acquired pneumonia and would possibly require IV antibiotics and oxygen support. I recommended transfer to the emergency room.  Trial breathing treatment here in the office and recheck pulse ox which did improve but only up to 90%.   Chest x-ray personally reviewed, concerning for left lower lobe pneumonia versus old scar tissue, images were reviewed with the patient and his wife, I advised him that radiologist would also interpret these images and we will call if they have anything else to add. See above for radiology interpretation.   For now presumptive treatment is for pneumonia/COPD exacerbation, consideration for healthcare acquired pneumonia which I am unable to adequately treat outpatient.   Reiterated all of the above to the patient and his wife,  answered all questions, he still declines transfer to emergency room.  Patient is alert and oriented, good insight, has capacity to make his own healthcare decisions. I educated him on the risks of trying to treat this outpatient, treatment may be inadequate and he may worsen. If he worsens to the point that he may require assistance with breathing, patient was educated on the possibility of intubation. He states that he is fine with sedation, intubation, mechanical ventilation if necessary and his wife agrees. I explained to the patient that we are often able to get pneumonia patients off of the ventilator once infection improves, however some patients require long-term mechanical ventilation or some patients do die. All questions were answered for the patient and his wife.   Patient feels ok to go home. Patient was given instructions to come back tomorrow for recheck but declines.   Ordered BMP and CBC chiefly to evaluate kidney function and white blood cell count. Patient was also advised that if his labs come back significantly abnormal this would also be a reason to be evaluated in the hospital.  COPD exacerbation ([place]) - Versus HCAP. Decreased oxygenation, increased work of breathing, borderline chest x-ray, patient declines transfer to ER - Plan: CBC with Differential/Platelet, COMPLETE METABOLIC PANEL WITH GFR, DG Chest 2 View, Nebulizer MISC, predniSONE (DELTASONE) 20 MG tablet, guaiFENesin-codeine 100-10 MG/5ML syrup, ipratropium-albuterol (DUONEB) 0.5-2.5 (3) MG/3ML nebulizer solution 3 mL, levofloxacin (LEVAQUIN) 750 MG tablet, ipratropium-albuterol (DUONEB) 0.5-2.5 (3) MG/3ML SOLN, DISCONTINUED: ipratropium-albuterol (DUONEB) 0.5-2.5 (3) MG/3ML SOLN  Cough - Plan: DG Chest 2 View  HCAP (healthcare-associated pneumonia)   Return in about 1 day (around 03/27/2015) for CHECK BREATHING AND OXYGEN LEVELS.   Total time spent 40 minutes, greater than 50% of the visit was counseling and  coordinating care for diagnosis of COPD/possible HCAP.

## 2015-03-27 ENCOUNTER — Other Ambulatory Visit: Payer: Self-pay | Admitting: Family Medicine

## 2015-03-27 DIAGNOSIS — J441 Chronic obstructive pulmonary disease with (acute) exacerbation: Secondary | ICD-10-CM | POA: Insufficient documentation

## 2015-03-30 ENCOUNTER — Encounter: Payer: Self-pay | Admitting: Osteopathic Medicine

## 2015-03-30 ENCOUNTER — Ambulatory Visit (INDEPENDENT_AMBULATORY_CARE_PROVIDER_SITE_OTHER): Payer: Medicare PPO | Admitting: Osteopathic Medicine

## 2015-03-30 VITALS — BP 139/67 | HR 73 | Wt 213.0 lb

## 2015-03-30 DIAGNOSIS — J441 Chronic obstructive pulmonary disease with (acute) exacerbation: Secondary | ICD-10-CM

## 2015-03-30 MED ORDER — GUAIFENESIN-CODEINE 100-10 MG/5ML PO SOLN: 5.0000 mL | Freq: Four times a day (QID) | ORAL | Status: DC | PRN

## 2015-03-30 MED ORDER — PREDNISONE 20 MG PO TABS: 20.0000 mg | ORAL_TABLET | Freq: Two times a day (BID) | ORAL | Status: DC

## 2015-03-30 MED ORDER — LEVOFLOXACIN 750 MG PO TABS: 750.0000 mg | ORAL_TABLET | Freq: Every day | ORAL | Status: DC

## 2015-03-30 NOTE — Progress Notes (Signed)
HPI: Joseph Berg is a 82 y.o. male who presents to [facility]  today for chief complaint of:  Chief Complaint  Patient presents with  . Follow-up    BREATHING   Patient is here for evaluation of cough/shortness of breath. He is coughing significantly, obvious increased work of breathing. . Location: chest . Quality: congestion, coughing . Severity: severe . Duration: 5 days . Context: yes sick contacts, s/p lumbar laminectomy x 1 month and also recently in ER for dehydration, no other recent travel. Seen in office last week for same respiratory concerns, patient and his wife report that pharmacy was confirmed by my nurse at last visit, and [facility] [address], however later in the day it seems [facility] [place] with the physical prescriptions for the nebulizer machine and the cough medicine, he got the cough medicine filled, the prescription for the nebulizer medicine was then switched to [place] however the patient's wife states that they did not pick up the antibiotic or the prednisone, they said that since these weren't at the pharmacy she wasn't sure actually meant to send them, she said she thought about calling for this issue on Friday but decided not to. Overall the patient is doing better in terms of oxygenation and his work of breathing is better, however patient states that he is still coughing significantly and feeling about the same overall.   Modifying factors: Patient has been compliant with nebulizers every 2-4 hours while awake. Reports that cough syrup helps but he is running low   Past medical, social and family history reviewed: Past Medical History  Diagnosis Date  . Heart attack ([place]) 1994  . Hypertension   . CAD (coronary artery disease)     a. s/p CABG x1 with LIMA-LAD 1994. b. LM & 3v CAD by cath 07/2012, pending CABG.  . Hypoparathyroidism ([place])   . BPH (benign prostatic hyperplasia)    . Hyperlipidemia   . CHF (congestive heart failure) ([place])     a. EF 35-40% by echo 07/2012.  [name] NSVT (nonsustained ventricular tachycardia) ([place])     a. Seen on tele 07/2012.  . Pseudoaneurysm of left femoral artery ([place])     a. post cath s/p compression 07/2012, associated w/ anemia.  . Emphysema     a. Moderate emphysema by CT 06/2012.  . Sciatica 2012    Qualifier: Diagnosis of  By: Madilyn Fireman MD, Barnetta Chapel    . Complication of anesthesia     affected patients memory. Pt stated "I couldn't remember anything for three months...just bits and pieces"  . Pneumonia   . OSA on CPAP     pt stated he does not wear CPAP because he no longer has sleep apnea   Past Surgical History  Procedure Laterality Date  . Cataract extraction, bilateral    . Intraoperative transesophageal echocardiogram N/A 08/14/2012    Procedure: INTRAOPERATIVE TRANSESOPHAGEAL ECHOCARDIOGRAM;  Surgeon: Gaye Pollack, MD;  Location: [facility] OR;  Service: Open Heart Surgery;  Laterality: N/A;  . Coronary artery bypass graft  03-23-92  . Coronary artery bypass graft N/A 08/14/2012    Procedure: REDO CORONARY ARTERY BYPASS GRAFTING (CABG);  Surgeon: Gaye Pollack, MD;  Location: [facility];  Service: Open Heart Surgery;  Laterality: N/A;  . Colonoscopy    . Lumbar laminectomy/decompression microdiscectomy Left 02/18/2015    Procedure: LUMBAR LAMINECTOMY/DECOMPRESSION MICRODISCECTOMY- LEFT FOUR-FIVE ;  Surgeon: Ashok Pall, MD;  Location:  [facility];  Service: Neurosurgery;  Laterality: Left;   Social History  Substance Use Topics  . Smoking status: Former Smoker -- 1.00 packs/day for 60 years    Quit date: 10/08/2010  . Smokeless tobacco: Not on file  . Alcohol Use: Yes     Comment: Occasional   Family History  Problem Relation Age of Onset  . Heart attack Father 69  . Stroke Brother 75  . Hypertension Brother   . Hyperlipidemia Brother     Current Outpatient Prescriptions  Medication Sig Dispense Refill  . acetaminophen  (TYLENOL) 500 MG tablet Take 1-2 tablets (500-1,000 mg total) by mouth every 6 (six) hours as needed for pain. 30 tablet 0  . Ascorbic Acid (VITAMIN C) 1000 MG tablet Take 1,000 mg by mouth daily.    [name] aspirin 81 MG tablet Take 81 mg by mouth daily.    . carvedilol (COREG) 12.5 MG tablet Take 1 tablet (12.5 mg total) by mouth 2 (two) times daily. 180 tablet 0  . cholecalciferol (VITAMIN D) 1000 UNITS tablet Take 3,000 Units by mouth daily.     . Cyanocobalamin (VITAMIN B-12 PO) Take 1 tablet by mouth daily.    . cyclobenzaprine (FLEXERIL) 10 MG tablet Take 1 tablet (10 mg total) by mouth 3 (three) times daily as needed for muscle spasms. 60 tablet 0  . finasteride (PROSCAR) 5 MG tablet TAKE ONE TABLET BY MOUTH ONE TIME DAILY 90 tablet 1  . furosemide (LASIX) 40 MG tablet TAKE ONE TABLET BY MOUTH ONE TIME DAILY 90 tablet 2  . guaiFENesin-codeine 100-10 MG/5ML syrup Take 5 mLs by mouth every 6 (six) hours as needed for cough. 100 mL 0  . ipratropium-albuterol (DUONEB) 0.5-2.5 (3) MG/3ML SOLN Take 3 mLs by nebulization every 2 (two) hours as needed (wheeze, SOB). 60 mL 3  . levofloxacin (LEVAQUIN) 750 MG tablet Take 1 tablet (750 mg total) by mouth daily. 7 tablet 0  . losartan (COZAAR) 50 MG tablet TAKE ONE TABLET BY MOUTH ONE TIME DAILY 30 tablet 6  . Multiple Vitamin (MULTIVITAMIN) capsule Take 1 capsule by mouth daily.    . Nebulizer MISC Generic nebulizer plus supplies 1 each 0  . oxyCODONE-acetaminophen (ROXICET) 5-325 MG tablet Take 1 tablet by mouth every 6 (six) hours as needed for severe pain. 70 tablet 0  . rosuvastatin (CRESTOR) 40 MG tablet Take 1 tablet (40 mg total) by mouth daily. 30 tablet 11  . SYMBICORT 160-4.5 MCG/ACT inhaler INHALE TWO PUFFS BY MOUTH TWICE DAILY 10.2 Inhaler 0  . [DISCONTINUED] metoprolol (TOPROL-XL) 100 MG 24 hr tablet Take 100 mg by mouth 2 (two) times daily.       No current facility-administered medications for this visit.   Allergies  Allergen Reactions   . Hctz [Hydrochlorothiazide] Other (See Comments)    Affected renal function.       Review of Systems: CONSTITUTIONAL: yes fever/chills  HEAD/EYES/EARS/NOSE/THROAT: no headache, no vision change or hearing change, no sore throat CARDIAC: No chest pain/pressure/palpitations, no orthopnea, reports SOB with exercion but no chest pain RESPIRATORY: yes cough, yes shortness of breath, (+) productive cough  GASTROINTESTINAL: no nausea, no vomiting, no abdominal pain/blood in stool/diarrhea/constipation MUSCULOSKELETAL: yes myalgia/arthralgia   Exam:  BP 139/67 mmHg  Pulse 73  Wt 213 lb (96.616 kg)  SpO2 92% Constitutional: VSS, see above. General Appearance: alert, well-developed, well-nourished, NAD.  Eyes: Normal lids and conjunctive, non-icteric sclera, Respiratory: Normal respiratory effort. (+) faint wheeze/ronchi diffuse, lung sounds are diminished  bilaterally in all fields.   Cardiovascular: Heart sounds faint. S1/S2 normal, no murmur/rub/gallop auscultated. RRR. No lower extremity edema.   ASSESSMENT/PLAN: Patient appears clinically improved however will make sure to send the antibiotics plus prednisone since the patient states that his symptoms are about the same. ER/RTC precautions reviewed, patient has follow-up with his PCP next month, if he is doing well he can keep this visit, however if he is not improved in the next 7 days or if he is worse sooner, come to the clinic for reevaluation.   COPD exacerbation ([place]) - Versus HCAP. Patient is improved in terms of work of breathing and oxygen saturation and lung exam, overall he feels about the same though. - Plan: guaiFENesin-codeine 100-10 MG/5ML syrup, levofloxacin (LEVAQUIN) 750 MG tablet, predniSONE (DELTASONE) 20 MG tablet   Return if symptoms worsen or fail to improve.

## 2015-04-20 ENCOUNTER — Encounter: Payer: Self-pay | Admitting: Family Medicine

## 2015-04-20 ENCOUNTER — Ambulatory Visit (INDEPENDENT_AMBULATORY_CARE_PROVIDER_SITE_OTHER): Payer: Medicare PPO | Admitting: Family Medicine

## 2015-04-20 VITALS — BP 132/78 | HR 75 | Wt 220.0 lb

## 2015-04-20 DIAGNOSIS — J449 Chronic obstructive pulmonary disease, unspecified: Secondary | ICD-10-CM

## 2015-04-20 DIAGNOSIS — E348 Other specified endocrine disorders: Secondary | ICD-10-CM | POA: Diagnosis not present

## 2015-04-20 DIAGNOSIS — J439 Emphysema, unspecified: Secondary | ICD-10-CM

## 2015-04-20 DIAGNOSIS — J984 Other disorders of lung: Secondary | ICD-10-CM | POA: Diagnosis not present

## 2015-04-20 LAB — POCT GLYCOSYLATED HEMOGLOBIN (HGB A1C): Hemoglobin A1C: 5.5

## 2015-04-20 NOTE — Progress Notes (Signed)
   Subjective:    Patient ID: Joseph Berg, male    DOB: 30-Mar-1933, 82 y.o.   MRN: [id number]  HPI COPD - No CP or SOB  Using his symbicort daily.  No recent flares or symptoms. Says he uses albuterol nebulizer at least once a day whether or not he needs it.  IFG - no inc thirst or urination.    Hypertension- Pt denies chest pain, SOB, dizziness, or heart palpitations.  Taking meds as directed w/o problems.  Denies medication side effects.     Review of Systems     Objective:   Physical Exam  Constitutional: He is oriented to person, place, and time. He appears well-developed and well-nourished.  HENT:  Head: Normocephalic and atraumatic.  Cardiovascular: Normal rate, regular rhythm and normal heart sounds.   Pulmonary/Chest: Effort normal and breath sounds normal.  Neurological: He is alert and oriented to person, place, and time.  Skin: Skin is warm and dry.  Psychiatric: He has a normal mood and affect. His behavior is normal.        Assessment & Plan:  COPD - Using symbicort daily.  Did explain to him that Symbicort has a long-acting albuterol so he he does not need his rescue albuterol but not to use it. Consider adding an anticholinergic.  IFG - A1C o f5.5. Well controlled. Continue to monitor. Recheck again in one year.  HTN - well controlled.

## 2015-04-29 ENCOUNTER — Other Ambulatory Visit: Payer: Self-pay | Admitting: Cardiology

## 2015-04-29 NOTE — Telephone Encounter (Signed)
REFILL 

## 2015-04-30 ENCOUNTER — Telehealth: Payer: Self-pay | Admitting: *Deleted

## 2015-04-30 NOTE — Telephone Encounter (Signed)
Pt's wife called and wanted to discuss his symbicort. She stated that it is $309 a month they are going to send some paperwork for him to get this for free. I will put samples for him up front to p/u.Audelia Hives [place]

## 2015-05-08 ENCOUNTER — Other Ambulatory Visit: Payer: Self-pay | Admitting: *Deleted

## 2015-05-08 MED ORDER — CARVEDILOL 12.5 MG PO TABS: 12.5000 mg | ORAL_TABLET | Freq: Two times a day (BID) | ORAL | Status: DC

## 2015-05-08 NOTE — Telephone Encounter (Signed)
Rx request sent to pharmacy.  

## 2015-05-19 ENCOUNTER — Telehealth: Payer: Self-pay | Admitting: *Deleted

## 2015-05-19 MED ORDER — BUDESONIDE-FORMOTEROL FUMARATE 160-4.5 MCG/ACT IN AERO: 2.0000 | INHALATION_SPRAY | Freq: Two times a day (BID) | RESPIRATORY_TRACT | Status: DC

## 2015-05-19 NOTE — Telephone Encounter (Signed)
Called pt and lvm informing him that he will need to complete the application and that it will be up front for him to complete.Maryruth Eve, Lahoma Crocker

## 2015-05-28 ENCOUNTER — Other Ambulatory Visit: Payer: Self-pay | Admitting: Cardiology

## 2015-05-28 NOTE — Telephone Encounter (Signed)
Rx refill sent to pharmacy. 

## 2015-06-05 DIAGNOSIS — M5430 Sciatica, unspecified side: Secondary | ICD-10-CM | POA: Diagnosis not present

## 2015-06-05 DIAGNOSIS — E669 Obesity, unspecified: Secondary | ICD-10-CM | POA: Diagnosis not present

## 2015-06-05 DIAGNOSIS — I10 Essential (primary) hypertension: Secondary | ICD-10-CM | POA: Diagnosis not present

## 2015-06-05 DIAGNOSIS — I2510 Atherosclerotic heart disease of native coronary artery without angina pectoris: Secondary | ICD-10-CM | POA: Diagnosis not present

## 2015-06-05 DIAGNOSIS — J45909 Unspecified asthma, uncomplicated: Secondary | ICD-10-CM | POA: Diagnosis not present

## 2015-06-05 DIAGNOSIS — N400 Enlarged prostate without lower urinary tract symptoms: Secondary | ICD-10-CM | POA: Diagnosis not present

## 2015-06-05 DIAGNOSIS — E785 Hyperlipidemia, unspecified: Secondary | ICD-10-CM | POA: Diagnosis not present

## 2015-06-05 DIAGNOSIS — Z6833 Body mass index (BMI) 33.0-33.9, adult: Secondary | ICD-10-CM | POA: Diagnosis not present

## 2015-06-05 DIAGNOSIS — Z951 Presence of aortocoronary bypass graft: Secondary | ICD-10-CM | POA: Diagnosis not present

## 2015-06-12 NOTE — Progress Notes (Signed)
HPI: FU CHF and CAD. Patient had a myocardial infarction followed by coronary artery bypassing graft in 1994 in [place]. Echo in June of 2014 showed an ejection fraction of 35-40%. There was mild left ventricular hypertrophy. Right ventricular function was mildly reduced. Cardiac catheterization in June of 2014 showed a 90% left main, occluded LAD, normal ramus, occluded circumflex and occluded right coronary artery. The LIMA to the LAD was patent. Ejection fraction was 35-40%. The patient underwent redo coronary artery bypass and graft in July of 2014 with a saphenous vein graft to the ramus and a saphenous vein graft to the PDA. Carotid Dopplers June 2015 showed 1-39% bilateral stenosis and follow-up recommended 2 years. Echocardiogram June 2015 showed ejection fraction 40-45%, mild left ventricular hypertrophy, mildly dilated right ventricle and right atrium. ABIs October 2016 showed moderate insufficiency on the right and normal left. Abdominal ultrasound November 2016 showed aneurysm measuring 4.5 x 3.5 cm and mild aneurysmal dilatation of bilateral common iliacs. Since I last saw him, He denies dyspnea, chest pain, palpitations, syncope or pedal edema.  Current Outpatient Prescriptions  Medication Sig Dispense Refill  . acetaminophen (TYLENOL) 500 MG tablet Take 1-2 tablets (500-1,000 mg total) by mouth every 6 (six) hours as needed for pain. 30 tablet 0  . Ascorbic Acid (VITAMIN C) 1000 MG tablet Take 1,000 mg by mouth daily.    [name] aspirin 81 MG tablet Take 81 mg by mouth daily.    . budesonide-formoterol (SYMBICORT) 160-4.5 MCG/ACT inhaler Inhale 2 puffs into the lungs 2 (two) times daily. 4 Inhaler 3  . carvedilol (COREG) 12.5 MG tablet Take 1 tablet (12.5 mg total) by mouth 2 (two) times daily. 180 tablet 0  . cholecalciferol (VITAMIN D) 1000 UNITS tablet Take 3,000 Units by mouth daily.     . Cyanocobalamin (VITAMIN B-12 PO) Take 1 tablet by mouth daily.    . finasteride (PROSCAR)  5 MG tablet TAKE ONE TABLET BY MOUTH ONE TIME DAILY 90 tablet 1  . furosemide (LASIX) 40 MG tablet TAKE ONE TABLET BY MOUTH ONE TIME DAILY (Patient taking differently: TAKE ONE TABLET BY MOUTH ONE TIME DAILY pt taking 20 mg) 90 tablet 2  . ipratropium-albuterol (DUONEB) 0.5-2.5 (3) MG/3ML SOLN Take 3 mLs by nebulization every 2 (two) hours as needed (wheeze, SOB). 60 mL 3  . losartan (COZAAR) 50 MG tablet TAKE ONE TABLET BY MOUTH ONE TIME DAILY 30 tablet 0  . Multiple Vitamin (MULTIVITAMIN) capsule Take 1 capsule by mouth daily.    . Nebulizer MISC Generic nebulizer plus supplies 1 each 0  . rosuvastatin (CRESTOR) 40 MG tablet Take 1 tablet (40 mg total) by mouth daily. 30 tablet 11  . [DISCONTINUED] metoprolol (TOPROL-XL) 100 MG 24 hr tablet Take 100 mg by mouth 2 (two) times daily.       No current facility-administered medications for this visit.     Past Medical History  Diagnosis Date  . Heart attack ([place]) 1994  . Hypertension   . CAD (coronary artery disease)     a. s/p CABG x1 with LIMA-LAD 1994. b. LM & 3v CAD by cath 07/2012, pending CABG.  . Hypoparathyroidism ([place])   . BPH (benign prostatic hyperplasia)   . Hyperlipidemia   . CHF (congestive heart failure) ([place])     a. EF 35-40% by echo 07/2012.  [name] NSVT (nonsustained ventricular tachycardia) ([place])     a. Seen on tele 07/2012.  . Pseudoaneurysm of left femoral artery ([place])  a. post cath s/p compression 07/2012, associated w/ anemia.  . Emphysema     a. Moderate emphysema by CT 06/2012.  . Sciatica 2012    Qualifier: Diagnosis of  By: Madilyn Fireman MD, Barnetta Chapel    . Complication of anesthesia     affected patients memory. Pt stated "I couldn't remember anything for three months...just bits and pieces"  . Pneumonia   . OSA on CPAP     pt stated he does not wear CPAP because he no longer has sleep apnea    Past Surgical History  Procedure Laterality Date  . Cataract extraction, bilateral    . Intraoperative transesophageal  echocardiogram N/A 08/14/2012    Procedure: INTRAOPERATIVE TRANSESOPHAGEAL ECHOCARDIOGRAM;  Surgeon: Gaye Pollack, MD;  Location: [facility] OR;  Service: Open Heart Surgery;  Laterality: N/A;  . Coronary artery bypass graft  03-23-92  . Coronary artery bypass graft N/A 08/14/2012    Procedure: REDO CORONARY ARTERY BYPASS GRAFTING (CABG);  Surgeon: Gaye Pollack, MD;  Location: [facility];  Service: Open Heart Surgery;  Laterality: N/A;  . Colonoscopy    . Lumbar laminectomy/decompression microdiscectomy Left 02/18/2015    Procedure: LUMBAR LAMINECTOMY/DECOMPRESSION MICRODISCECTOMY- LEFT FOUR-FIVE ;  Surgeon: Ashok Pall, MD;  Location: [facility];  Service: Neurosurgery;  Laterality: Left;    Social History   Social History  . Marital Status: Married    Spouse Name: N/A  . Number of Children: N/A  . Years of Education: N/A   Occupational History  . Not on file.   Social History Main Topics  . Smoking status: Former Smoker -- 1.00 packs/day for 60 years    Quit date: 10/08/2010  . Smokeless tobacco: Not on file  . Alcohol Use: Yes     Comment: Occasional  . Drug Use: No  . 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 Not on file   Social History Narrative    Family History  Problem Relation Age of Onset  . Heart attack Father 50  . Stroke Brother 53  . Hypertension Brother   . Hyperlipidemia Brother     ROS: back pain but no fevers or chills, productive cough, hemoptysis, dysphasia, odynophagia, melena, hematochezia, dysuria, hematuria, rash, seizure activity, orthopnea, PND, pedal edema, claudication. Remaining systems are negative.  Physical Exam: Well-developed obese in no acute distress.  Skin is warm and dry.  HEENT is normal.  Neck is supple.  Chest is clear to auscultation with normal expansion.  Cardiovascular exam is regular rate and rhythm.  Abdominal  exam nontender or distended. No masses palpated. Extremities show no edema. neuro grossly intact  ECG Sinus rhythm, right bundle branch block.

## 2015-06-17 ENCOUNTER — Ambulatory Visit (INDEPENDENT_AMBULATORY_CARE_PROVIDER_SITE_OTHER): Payer: Medicare PPO | Admitting: Cardiology

## 2015-06-17 ENCOUNTER — Encounter: Payer: Self-pay | Admitting: Cardiology

## 2015-06-17 VITALS — BP 133/77 | HR 69 | Ht 68.0 in | Wt 221.1 lb

## 2015-06-17 DIAGNOSIS — I714 Abdominal aortic aneurysm, without rupture, unspecified: Secondary | ICD-10-CM

## 2015-06-17 DIAGNOSIS — I25810 Atherosclerosis of coronary artery bypass graft(s) without angina pectoris: Secondary | ICD-10-CM | POA: Diagnosis not present

## 2015-06-17 DIAGNOSIS — I679 Cerebrovascular disease, unspecified: Secondary | ICD-10-CM

## 2015-06-17 NOTE — Assessment & Plan Note (Signed)
Continue aspirin and statin. 

## 2015-06-17 NOTE — Assessment & Plan Note (Signed)
Continue statin. 

## 2015-06-17 NOTE — Assessment & Plan Note (Signed)
Continue ARB and beta blocker. 

## 2015-06-17 NOTE — Assessment & Plan Note (Signed)
Follow-up carotid Dopplers June 2017.

## 2015-06-17 NOTE — Assessment & Plan Note (Signed)
Blood pressure controlled. Continue present medications. 

## 2015-06-17 NOTE — Assessment & Plan Note (Addendum)
Follow-up abdominal ultrasound May 2017.

## 2015-06-17 NOTE — Patient Instructions (Signed)
Medication Instructions:   NO CHANGE  Testing/Procedures:  Your physician has requested that you have an abdominal aorta duplex. During this test, an ultrasound is used to evaluate the aorta. Allow 30 minutes for this exam. Do not eat after midnight the day before and avoid carbonated beverages   Your physician has requested that you have a carotid duplex. This test is an ultrasound of the carotid arteries in your neck. It looks at blood flow through these arteries that supply the brain with blood. Allow one hour for this exam. There are no restrictions or special instructions.    Follow-Up:  Your physician wants you to follow-up in: [place] will receive a reminder letter in the mail two months in advance. If you don't receive a letter, please call our office to schedule the follow-up appointment.   If you need a refill on your cardiac medications before your next appointment, please call your pharmacy.

## 2015-06-29 ENCOUNTER — Other Ambulatory Visit: Payer: Self-pay | Admitting: Cardiology

## 2015-06-30 NOTE — Telephone Encounter (Signed)
Rx(s) sent to pharmacy electronically.  

## 2015-07-03 ENCOUNTER — Ambulatory Visit (HOSPITAL_BASED_OUTPATIENT_CLINIC_OR_DEPARTMENT_OTHER): Payer: Medicare PPO

## 2015-07-08 ENCOUNTER — Telehealth (INDEPENDENT_AMBULATORY_CARE_PROVIDER_SITE_OTHER): Payer: Medicare PPO | Admitting: Cardiology

## 2015-07-08 ENCOUNTER — Telehealth: Payer: Self-pay | Admitting: Cardiology

## 2015-07-08 DIAGNOSIS — I714 Abdominal aortic aneurysm, without rupture, unspecified: Secondary | ICD-10-CM

## 2015-07-08 DIAGNOSIS — I679 Cerebrovascular disease, unspecified: Secondary | ICD-10-CM

## 2015-07-08 NOTE — Telephone Encounter (Signed)
New Message  Pt wife requested to speak w/ RN about pt's orders for carotid/Aorta- Please call back and discuss.

## 2015-07-08 NOTE — Telephone Encounter (Signed)
07-08-15 Lvm to call to discuss test that need to be r/s @ [facility]

## 2015-07-08 NOTE — Telephone Encounter (Signed)
Joseph Berg would like to go to [facility] for his Ultrasound. He will need to be NPO for 6-8 hours for the US Aorta and he would like the procedures done together in the morning.  [facility] # is [phone].  Spoke with nina at [facility], orders for carotid and aorta US faxed to [facility] @ [phone], as well as pt demographic sheet for scheduling.

## 2015-07-09 NOTE — Telephone Encounter (Signed)
left message for pt, orders have been faxed to [facility]. They are to contact the pt to schedule.

## 2015-07-17 ENCOUNTER — Other Ambulatory Visit: Payer: Self-pay | Admitting: Family Medicine

## 2015-07-20 DIAGNOSIS — I6523 Occlusion and stenosis of bilateral carotid arteries: Secondary | ICD-10-CM | POA: Diagnosis not present

## 2015-07-20 DIAGNOSIS — I63233 Cerebral infarction due to unspecified occlusion or stenosis of bilateral carotid arteries: Secondary | ICD-10-CM | POA: Diagnosis not present

## 2015-07-20 DIAGNOSIS — I679 Cerebrovascular disease, unspecified: Secondary | ICD-10-CM | POA: Diagnosis not present

## 2015-07-20 DIAGNOSIS — I714 Abdominal aortic aneurysm, without rupture: Secondary | ICD-10-CM | POA: Diagnosis not present

## 2015-07-20 DIAGNOSIS — I719 Aortic aneurysm of unspecified site, without rupture: Secondary | ICD-10-CM | POA: Diagnosis not present

## 2015-07-21 ENCOUNTER — Telehealth: Payer: Self-pay | Admitting: Cardiology

## 2015-07-21 NOTE — Telephone Encounter (Signed)
Patient was schedule for Aorta U/S and Cartiod @ [facility].  Appointment was cancel on 07-01-15 do to the test was schedule in the afternoon and not morning.  Have left voice messages on 5/31, 6-1, 6-9 and 6-13.  I will defer until patient call back to schedule.

## 2015-07-21 NOTE — Telephone Encounter (Signed)
NOTED

## 2015-07-29 ENCOUNTER — Other Ambulatory Visit: Payer: Self-pay | Admitting: Family Medicine

## 2015-07-30 ENCOUNTER — Telehealth: Payer: Self-pay | Admitting: Cardiology

## 2015-07-30 NOTE — Telephone Encounter (Signed)
Spoke with pt wife, aware AAA has not change and there is <50% blockage in both carotid arteries

## 2015-07-30 NOTE — Telephone Encounter (Signed)
New message      Calling to get ultrasound test results (carotid and abd aneurysm)

## 2015-08-03 ENCOUNTER — Other Ambulatory Visit: Payer: Self-pay | Admitting: Cardiology

## 2015-08-04 NOTE — Telephone Encounter (Signed)
Rx request sent to pharmacy.  

## 2015-08-06 ENCOUNTER — Encounter: Payer: Self-pay | Admitting: Cardiology

## 2015-08-26 ENCOUNTER — Other Ambulatory Visit: Payer: Self-pay | Admitting: Cardiology

## 2015-09-09 ENCOUNTER — Other Ambulatory Visit: Payer: Self-pay | Admitting: Family Medicine

## 2015-10-07 ENCOUNTER — Encounter: Payer: Self-pay | Admitting: *Deleted

## 2015-10-07 ENCOUNTER — Emergency Department
Admission: EM | Admit: 2015-10-07 | Discharge: 2015-10-07 | Disposition: A | Discharge status: Home / Self Care | Payer: Medicare PPO | Source: Home / Self Care | Attending: Family Medicine | Admitting: Family Medicine

## 2015-10-07 DIAGNOSIS — H5712 Ocular pain, left eye: Secondary | ICD-10-CM

## 2015-10-07 DIAGNOSIS — H579 Unspecified disorder of eye and adnexa: Secondary | ICD-10-CM

## 2015-10-07 DIAGNOSIS — H00034 Abscess of left upper eyelid: Secondary | ICD-10-CM | POA: Diagnosis not present

## 2015-10-07 DIAGNOSIS — H11412 Vascular abnormalities of conjunctiva, left eye: Secondary | ICD-10-CM

## 2015-10-07 NOTE — ED Provider Notes (Signed)
Vitals:   10/07/15 1150  BP: 140/78  Pulse: 77  Resp: 18  Temp: 98.2 F (36.8 C)   Visual acuity with glasses, distance:  Left eye: 20/30  Right eye: 20/30 Both eyes: 20/25  Joseph Berg is a 82 y.o. male presenting to UC with c/o gradually worsening Left eye pain, redness, and crusty discharge.  Pt has hx of 2 cataract surgeries.  First was over 20 years ago, the second was around 2010.  He tried using eye drops this morning w/o relief.  Pain is aching and throbbing.  He has not f/u with his eye doctor recently as he use to see Dr. Mcarthur Rossetti, [facility], but has another another provider at that office per his wife when Dr. Mcarthur Rossetti left. Denies injury to his eye. Denies fever, chills, n/v/d.  On exam Left eye appears slightly bulging, significantly injected. Appears painful.  Mildly tender periorbital region w/o erythema or edema. No obvious drainage noted on exam.  Concern for possible acute glaucoma. Pt accompanied by his wife. Due to pain, appearance of eye and hx of prior eye surgeries, recommend pt f/u with optometrist today for further evaluation and treatment as eye pressure cannot be tested at this facility.    Pt able to be worked into schedule at [facility] in [place]. Pt's wife to drive him directly to clinic for further evaluation. Pt and wife agreeable with plan.    Noland Fordyce, PA-C 10/07/15 1209

## 2015-10-07 NOTE — Discharge Instructions (Signed)
Please go directly to [facility] for further evaluation of Left eye

## 2015-10-07 NOTE — ED Triage Notes (Addendum)
Pt c/o LT eye redness and pain x 1 day. Denies injury,fever or itching. He reports that it was matted together this morning. He also reports a runny nose and headache.

## 2015-10-08 ENCOUNTER — Telehealth: Payer: Self-pay | Admitting: Emergency Medicine

## 2015-10-08 NOTE — Telephone Encounter (Signed)
Hope your eye is getting better.

## 2015-10-14 DIAGNOSIS — L718 Other rosacea: Secondary | ICD-10-CM | POA: Diagnosis not present

## 2015-10-22 ENCOUNTER — Encounter: Payer: Self-pay | Admitting: Family Medicine

## 2015-10-22 ENCOUNTER — Ambulatory Visit (INDEPENDENT_AMBULATORY_CARE_PROVIDER_SITE_OTHER): Payer: Medicare PPO | Admitting: Family Medicine

## 2015-10-22 VITALS — BP 143/80 | HR 76 | Wt 220.0 lb

## 2015-10-22 DIAGNOSIS — I5022 Chronic systolic (congestive) heart failure: Secondary | ICD-10-CM

## 2015-10-22 DIAGNOSIS — I10 Essential (primary) hypertension: Secondary | ICD-10-CM | POA: Diagnosis not present

## 2015-10-22 DIAGNOSIS — Z23 Encounter for immunization: Secondary | ICD-10-CM

## 2015-10-22 DIAGNOSIS — E348 Other specified endocrine disorders: Secondary | ICD-10-CM | POA: Diagnosis not present

## 2015-10-22 DIAGNOSIS — J441 Chronic obstructive pulmonary disease with (acute) exacerbation: Secondary | ICD-10-CM | POA: Diagnosis not present

## 2015-10-22 LAB — POCT GLYCOSYLATED HEMOGLOBIN (HGB A1C): Hemoglobin A1C: 5.7

## 2015-10-22 NOTE — Progress Notes (Signed)
Subjective:    CC:   HPI:  COPD - Using his Symbicort daily. He said the price on the medication did go up. He is likely hit his Medicare gap. He has not had to use his nebulizer machine and a really long time. He says that thing that limits his activity the most is low back pain, it's not really his breathing.  Hypertension- Pt denies chest pain, SOB, dizziness, or heart palpitations.  Taking meds as directed w/o problems.  Denies medication side effects.    IFG - no increased thirst or urination.  Congestive heart failure-doing well. His eye had any shortness of breath palpitations or Dr. Jeannett Senior edema. He takes Lasix daily except for days when he might leave the house. Sometimes he'll skip it and take it later when he gets home.  Past medical history, Surgical history, Family history not pertinant except as noted below, Social history, Allergies, and medications have been entered into the medical record, reviewed, and corrections made.   Review of Systems: No fevers, chills, night sweats, weight loss, chest pain, or shortness of breath.   Objective:    General: Well Developed, well nourished, and in no acute distress.  Neuro: Alert and oriented x3, extra-ocular muscles intact, sensation grossly intact.  HEENT: Normocephalic, atraumatic  Skin: Warm and dry, no rashes. Cardiac: Regular rate and rhythm, no murmurs rubs or gallops, no lower extremity edema.  Respiratory: Clear to auscultation bilaterally. Not using accessory muscles, speaking in full sentences.   Impression and Recommendations:    COPD - Stable. Continue with Symbicort. Albuterol as needed. He has not had any recent flares or exacerbations. Flu shot given today.  HTN - borderline elevated today. Will follow.    IFG - A1c 5.7 today. Stable. We'll continue to monitor every 6-12 months.  Congestive heart failure, systolic-continue Lasix, ARB. Asymptomatic.

## 2015-10-23 LAB — COMPLETE METABOLIC PANEL WITH GFR
ALT: 25 U/L (ref 9–46)
AST: 23 U/L (ref 10–35)
Albumin: 4.2 g/dL (ref 3.6–5.1)
Alkaline Phosphatase: 42 U/L (ref 40–115)
BILIRUBIN TOTAL: 1 mg/dL (ref 0.2–1.2)
BUN: 18 mg/dL (ref 7–25)
CHLORIDE: 103 mmol/L (ref 98–110)
CO2: 29 mmol/L (ref 20–31)
Calcium: 9.7 mg/dL (ref 8.6–10.3)
Creat: 0.92 mg/dL (ref 0.70–1.11)
GFR, EST NON AFRICAN AMERICAN: 78 mL/min (ref >=60)
GLUCOSE: 114 mg/dL — AB (ref 65–99)
POTASSIUM: 4.4 mmol/L (ref 3.5–5.3)
SODIUM: 141 mmol/L (ref 135–146)
Total Protein: 6.3 g/dL (ref 6.1–8.1)

## 2015-10-23 LAB — LIPID PANEL
CHOL/HDL RATIO: 2.7 ratio (ref <=5.0)
Cholesterol: 107 mg/dL — ABNORMAL LOW (ref 125–200)
HDL: 39 mg/dL — AB (ref >=40)
LDL CALC: 36 mg/dL (ref <130)
TRIGLYCERIDES: 158 mg/dL — AB (ref <150)
VLDL: 32 mg/dL — AB (ref <30)

## 2015-10-26 NOTE — Progress Notes (Signed)
All labs are normal. 

## 2015-12-14 ENCOUNTER — Ambulatory Visit (INDEPENDENT_AMBULATORY_CARE_PROVIDER_SITE_OTHER): Payer: Medicare PPO | Admitting: Sports Medicine

## 2015-12-14 ENCOUNTER — Encounter: Payer: Self-pay | Admitting: Sports Medicine

## 2015-12-14 ENCOUNTER — Other Ambulatory Visit: Payer: Self-pay | Admitting: Cardiology

## 2015-12-14 DIAGNOSIS — M961 Postlaminectomy syndrome, not elsewhere classified: Secondary | ICD-10-CM

## 2015-12-14 MED ORDER — GABAPENTIN 300 MG PO CAPS: ORAL_CAPSULE | ORAL | 3 refills | Status: DC

## 2015-12-14 MED ORDER — PREDNISONE 50 MG PO TABS: ORAL_TABLET | ORAL | 0 refills | Status: DC

## 2015-12-14 NOTE — Progress Notes (Signed)
   Subjective:    I'm seeing this patient as a consultation for:  Dr. Beatrice Lecher  CC: Back pain  HPI: This is a pleasant 82 year old male with known lumbar degenerative disc disease, earlier this year he had a L4-L5 left-sided laminotomy with decompression and discectomy. Initially he did well but then had recurrence of back pain with radiation down the left leg in the same distribution as prior. No bowel or bladder dysfunction, saddle numbness, no constitutional symptoms, no trauma.  Past medical history:  Negative.  See flowsheet/record as well for more information.  Surgical history: Negative.  See flowsheet/record as well for more information.  Family history: Negative.  See flowsheet/record as well for more information.  Social history: Negative.  See flowsheet/record as well for more information.  Allergies, and medications have been entered into the medical record, reviewed, and no changes needed.   Review of Systems: No headache, visual changes, nausea, vomiting, diarrhea, constipation, dizziness, abdominal pain, skin rash, fevers, chills, night sweats, weight loss, swollen lymph nodes, body aches, joint swelling, muscle aches, chest pain, shortness of breath, mood changes, visual or auditory hallucinations.   Objective:   General: Well Developed, well nourished, and in no acute distress.  Neuro/Psych: Alert and oriented x3, extra-ocular muscles intact, able to move all 4 extremities, sensation grossly intact. Skin: Warm and dry, no rashes noted.  Respiratory: Not using accessory muscles, speaking in full sentences, trachea midline.  Cardiovascular: Pulses palpable, no extremity edema. Abdomen: Does not appear distended. Back Exam:  Inspection: Unremarkable  Motion: Flexion 45 deg, Extension 45 deg, Side Bending to 45 deg bilaterally,  Rotation to 45 deg bilaterally  SLR laying: Negative  XSLR laying: Negative  Palpable tenderness: None. FABER: negative. Sensory  change: Gross sensation intact to all lumbar and sacral dermatomes.  Reflexes: 2+ at both patellar tendons, 2+ at achilles tendons, Babinski's downgoing.  Strength at foot  Plantar-flexion: 5/5 Dorsi-flexion: 5/5 Eversion: 5/5 Inversion: 5/5  Leg strength  Quad: 5/5 Hamstring: 5/5 Hip flexor: 5/5 Hip abductors: 5/5  Gait unremarkable.  MRI done which appears to be about a month after surgery shows persistence of L4-L5 and L5-S1 disc protrusions with multilevel facet arthritis. Laminectomy defect at the left L4-L5 level is visible.  Impression and Recommendations:   This case required medical decision making of moderate complexity.  Postlaminectomy syndrome, lumbar region Status post left L4-L5 laminotomy with decompression earlier this year, now having a recurrence of left-sided L4 distribution radiculopathy, same distribution as prior to the surgery. He did have relief after his procedure. An MRI done after the surgery did show some persistence of disc protrusion at the L4-L5 level. We are going to proceed with prednisone, low-dose gabapentin. If insufficient relief we will have to proceed with another MRI with and without IV contrast in anticipation of a lumbar epidural.

## 2015-12-14 NOTE — Assessment & Plan Note (Signed)
Status post left L4-L5 laminotomy with decompression earlier this year, now having a recurrence of left-sided L4 distribution radiculopathy, same distribution as prior to the surgery. He did have relief after his procedure. An MRI done after the surgery did show some persistence of disc protrusion at the L4-L5 level. We are going to proceed with prednisone, low-dose gabapentin. If insufficient relief we will have to proceed with another MRI with and without IV contrast in anticipation of a lumbar epidural.

## 2016-01-11 ENCOUNTER — Encounter: Payer: Self-pay | Admitting: Sports Medicine

## 2016-01-11 ENCOUNTER — Ambulatory Visit (INDEPENDENT_AMBULATORY_CARE_PROVIDER_SITE_OTHER): Payer: Medicare PPO | Admitting: Sports Medicine

## 2016-01-11 DIAGNOSIS — M961 Postlaminectomy syndrome, not elsewhere classified: Secondary | ICD-10-CM | POA: Diagnosis not present

## 2016-01-11 MED ORDER — GABAPENTIN 600 MG PO TABS: 600.0000 mg | ORAL_TABLET | Freq: Three times a day (TID) | ORAL | 3 refills | Status: DC

## 2016-01-11 NOTE — Progress Notes (Signed)
  Subjective:    CC:  Follow-up   HPI: Low back pain: Essentially resolved now after a few days of prednisone and gabapentin. Happy with how things are going.  Past medical history:  Negative.  See flowsheet/record as well for more information.  Surgical history: Negative.  See flowsheet/record as well for more information.  Family history: Negative.  See flowsheet/record as well for more information.  Social history: Negative.  See flowsheet/record as well for more information.  Allergies, and medications have been entered into the medical record, reviewed, and no changes needed.   Review of Systems: No fevers, chills, night sweats, weight loss, chest pain, or shortness of breath.   Objective:    General: Well Developed, well nourished, and in no acute distress.  Neuro: Alert and oriented x3, extra-ocular muscles intact, sensation grossly intact.  HEENT: Normocephalic, atraumatic, pupils equal round reactive to light, neck supple, no masses, no lymphadenopathy, thyroid nonpalpable.  Skin: Warm and dry, no rashes. Cardiac: Regular rate and rhythm, no murmurs rubs or gallops, no lower extremity edema.  Respiratory: Clear to auscultation bilaterally. Not using accessory muscles, speaking in full sentences.  Impression and Recommendations:    Postlaminectomy syndrome, lumbar region Post L4-L5 laminectomy on the left recurrent left L4 distribution radiculopathy, and MRI after surgery did show persistence of the disc protrusion at the L4-L5 level. Symptoms have all but resolved on gabapentin and prednisone, I'm going to increase gabapentin 600 mg 3 times per day. His deductible was met and he spoke to his insurance company who tells [place] that they will pay for an MRI, which will need to be with and without IV contrast, ordering a BMP.  I spent 25 minutes with this patient, greater than 50% was face-to-face time counseling regarding the above diagnoses

## 2016-01-11 NOTE — Assessment & Plan Note (Signed)
Post L4-L5 laminectomy on the left recurrent left L4 distribution radiculopathy, and MRI after surgery did show persistence of the disc protrusion at the L4-L5 level. Symptoms have all but resolved on gabapentin and prednisone, I'm going to increase gabapentin 600 mg 3 times per day. His deductible was met and he spoke to his insurance company who tells [place] that they will pay for an MRI, which will need to be with and without IV contrast, ordering a BMP.

## 2016-01-12 LAB — BASIC METABOLIC PANEL
CO2: 28 mmol/L (ref 20–31)
Calcium: 9.9 mg/dL (ref 8.6–10.3)
Creat: 0.81 mg/dL (ref 0.70–1.11)
Glucose, Bld: 90 mg/dL (ref 65–99)
Sodium: 141 mmol/L (ref 135–146)

## 2016-01-12 LAB — BASIC METABOLIC PANEL WITH GFR
BUN: 17 mg/dL (ref 7–25)
Chloride: 104 mmol/L (ref 98–110)
Potassium: 5 mmol/L (ref 3.5–5.3)

## 2016-01-14 ENCOUNTER — Ambulatory Visit (HOSPITAL_BASED_OUTPATIENT_CLINIC_OR_DEPARTMENT_OTHER)
Admission: RE | Admit: 2016-01-14 | Discharge: 2016-01-14 | Disposition: A | Discharge status: Home / Self Care | Payer: Medicare PPO | Source: Ambulatory Visit | Attending: Sports Medicine | Admitting: Sports Medicine

## 2016-01-14 DIAGNOSIS — I714 Abdominal aortic aneurysm, without rupture: Secondary | ICD-10-CM | POA: Diagnosis not present

## 2016-01-14 DIAGNOSIS — M961 Postlaminectomy syndrome, not elsewhere classified: Secondary | ICD-10-CM | POA: Diagnosis not present

## 2016-01-14 DIAGNOSIS — M48061 Spinal stenosis, lumbar region without neurogenic claudication: Secondary | ICD-10-CM | POA: Insufficient documentation

## 2016-01-14 DIAGNOSIS — Z9889 Other specified postprocedural states: Secondary | ICD-10-CM | POA: Insufficient documentation

## 2016-01-14 DIAGNOSIS — M5126 Other intervertebral disc displacement, lumbar region: Secondary | ICD-10-CM | POA: Diagnosis not present

## 2016-01-14 MED ORDER — GADOBENATE DIMEGLUMINE 529 MG/ML IV SOLN: 20.0000 mL | Freq: Once | INTRAVENOUS | Status: DC | PRN

## 2016-01-19 DIAGNOSIS — H02055 Trichiasis without entropian left lower eyelid: Secondary | ICD-10-CM | POA: Diagnosis not present

## 2016-01-19 DIAGNOSIS — H5201 Hypermetropia, right eye: Secondary | ICD-10-CM | POA: Diagnosis not present

## 2016-01-19 DIAGNOSIS — H59811 Chorioretinal scars after surgery for detachment, right eye: Secondary | ICD-10-CM | POA: Diagnosis not present

## 2016-01-19 DIAGNOSIS — Z961 Presence of intraocular lens: Secondary | ICD-10-CM | POA: Diagnosis not present

## 2016-01-19 DIAGNOSIS — H16223 Keratoconjunctivitis sicca, not specified as Sjogren's, bilateral: Secondary | ICD-10-CM | POA: Diagnosis not present

## 2016-01-19 DIAGNOSIS — H52223 Regular astigmatism, bilateral: Secondary | ICD-10-CM | POA: Diagnosis not present

## 2016-01-19 DIAGNOSIS — H5212 Myopia, left eye: Secondary | ICD-10-CM | POA: Diagnosis not present

## 2016-01-19 DIAGNOSIS — H5702 Anisocoria: Secondary | ICD-10-CM | POA: Diagnosis not present

## 2016-01-19 DIAGNOSIS — H524 Presbyopia: Secondary | ICD-10-CM | POA: Diagnosis not present

## 2016-02-28 ENCOUNTER — Other Ambulatory Visit: Payer: Self-pay | Admitting: Family Medicine

## 2016-03-16 ENCOUNTER — Other Ambulatory Visit: Payer: Self-pay

## 2016-03-16 ENCOUNTER — Encounter: Payer: Self-pay | Admitting: *Deleted

## 2016-03-16 ENCOUNTER — Emergency Department
Admission: EM | Admit: 2016-03-16 | Discharge: 2016-03-16 | Disposition: A | Discharge status: Home / Self Care | Payer: Medicare PPO | Source: Home / Self Care | Attending: Family Medicine | Admitting: Family Medicine

## 2016-03-16 DIAGNOSIS — R69 Illness, unspecified: Secondary | ICD-10-CM

## 2016-03-16 DIAGNOSIS — R110 Nausea: Secondary | ICD-10-CM | POA: Diagnosis not present

## 2016-03-16 DIAGNOSIS — R0902 Hypoxemia: Secondary | ICD-10-CM | POA: Diagnosis not present

## 2016-03-16 DIAGNOSIS — J441 Chronic obstructive pulmonary disease with (acute) exacerbation: Secondary | ICD-10-CM

## 2016-03-16 DIAGNOSIS — J111 Influenza due to unidentified influenza virus with other respiratory manifestations: Secondary | ICD-10-CM

## 2016-03-16 MED ORDER — OSELTAMIVIR PHOSPHATE 75 MG PO CAPS: 75.0000 mg | ORAL_CAPSULE | Freq: Two times a day (BID) | ORAL | 0 refills | Status: DC

## 2016-03-16 MED ORDER — ONDANSETRON 4 MG PO TBDP: ORAL_TABLET | ORAL | 0 refills | Status: DC

## 2016-03-16 NOTE — Discharge Instructions (Signed)
Resume all medications when nausea improved. Continue all inhalers as prescribed. Take plain guaifenesin ('1200mg'$  extended release tabs such as Mucinex) twice daily, with adequate amount of water, for cough and congestion.   Get adequate rest.   Try warm salt water gargles for sore throat.  Stop all antihistamines for now, and other non-prescription cough/cold preparations. May take Delsym Cough Suppressant at bedtime for nighttime cough.  If symptoms become significantly worse during the night or over the weekend, proceed to the local emergency room or call 911.

## 2016-03-16 NOTE — ED Provider Notes (Signed)
[name]    CSN: [id number] Arrival date & time: 03/16/16  1348     History   Chief Complaint Chief Complaint  Patient presents with  . Generalized Body Aches  . Headache    HPI Joseph Berg is a 83 y.o. male.   Patient states that he began coughing occasionally yesterday, and felt fatigued when he went to bed.  He awoke this morning feeling extremely fatigued, with myalgias, nausea, chills, and an episode of vomiting.  He briefly had non-radiating chest pain (lasting about 5 minutes) that resolved completely.  He has increased shortness of breath with rest and activity, and feels dizzy when he stands.   The history is provided by the patient.    Past Medical History:  Diagnosis Date  . BPH (benign prostatic hyperplasia)   . CAD (coronary artery disease)    a. s/p CABG x1 with LIMA-LAD 1994. b. LM & 3v CAD by cath 07/2012, pending CABG.  . CHF (congestive heart failure) ([place])    a. EF 35-40% by echo 07/2012.  [name] Complication of anesthesia    affected patients memory. Pt stated "I couldn't remember anything for three months...just bits and pieces"  . Emphysema    a. Moderate emphysema by CT 06/2012.  [name] Heart attack 1994  . Hyperlipidemia   . Hypertension   . Hypoparathyroidism ([place])   . NSVT (nonsustained ventricular tachycardia) ([place])    a. Seen on tele 07/2012.  . OSA on CPAP    pt stated he does not wear CPAP because he no longer has sleep apnea  . Pneumonia   . Pseudoaneurysm of left femoral artery ([place])    a. post cath s/p compression 07/2012, associated w/ anemia.  . Sciatica 2012   Qualifier: Diagnosis of  By: Madilyn Fireman MD, [place]      Patient Active Problem List   Diagnosis Date Noted  . Postlaminectomy syndrome, lumbar region 12/14/2015  . COPD exacerbation ([place]) 03/27/2015  . HNP (herniated nucleus pulposus), lumbar 02/18/2015  . Claudication of right lower extremity ([place]) 12/09/2014  . Adiposity 06/20/2014  . Lumbar degenerative  disc disease 05/13/2014  . Mixed restrictive and obstructive lung disease ([place]) 01/16/2014  . Cerebrovascular disease 06/26/2013  . AAA (abdominal aortic aneurysm) without rupture ([place]) 06/24/2013  . Aortic calcification ([place]) 06/10/2013  . S/P CABG x 2 08/17/2012  . Near syncope 08/01/2012  . Cardiomyopathy, ischemic 07/25/2012  . Chronic systolic congestive heart failure ([place]) 07/11/2012  . CAD (coronary artery disease) of artery bypass graft 07/06/2012  . OSA (obstructive sleep apnea) 01/24/2012  . BPH (benign prostatic hyperplasia) 03/02/2011  . History of tobacco abuse 08/23/2010  . Hypoparathyroidism ([place]) 07/30/2010  . SCIATICA 11/10/2009  . INSULIN RESISTANCE SYNDROME 04/16/2007  . HYPERLIPIDEMIA NEC/NOS 11/14/2006  . HYPERTENSION, BENIGN ESSENTIAL 11/14/2006    Past Surgical History:  Procedure Laterality Date  . CATARACT EXTRACTION, BILATERAL    . COLONOSCOPY    . CORONARY ARTERY BYPASS GRAFT  03-23-92  . CORONARY ARTERY BYPASS GRAFT N/A 08/14/2012   Procedure: REDO CORONARY ARTERY BYPASS GRAFTING (CABG);  Surgeon: Gaye Pollack, MD;  Location: [facility];  Service: Open Heart Surgery;  Laterality: N/A;  . INTRAOPERATIVE TRANSESOPHAGEAL ECHOCARDIOGRAM N/A 08/14/2012   Procedure: INTRAOPERATIVE TRANSESOPHAGEAL ECHOCARDIOGRAM;  Surgeon: Gaye Pollack, MD;  Location: [facility];  Service: Open Heart Surgery;  Laterality: N/A;  . LUMBAR LAMINECTOMY/DECOMPRESSION MICRODISCECTOMY Left 02/18/2015   Procedure: LUMBAR LAMINECTOMY/DECOMPRESSION MICRODISCECTOMY- LEFT FOUR-FIVE ;  Surgeon: Ashok Pall, MD;  Location: [facility];  Service: Neurosurgery;  Laterality: Left;       Home Medications    Prior to Admission medications   Medication Sig Start Date End Date Taking? Authorizing Provider  acetaminophen (TYLENOL) 500 MG tablet Take 1-2 tablets (500-1,000 mg total) by mouth every 6 (six) hours as needed for pain. 08/03/12   Dayna N Dunn, PA-C  Ascorbic Acid (VITAMIN C) 1000 MG tablet  Take 1,000 mg by mouth daily.    Historical Provider, MD  aspirin 81 MG tablet Take 81 mg by mouth daily.    Historical Provider, MD  carvedilol (COREG) 12.5 MG tablet TAKE 1 TABLET (12.5 MG TOTAL) BY MOUTH 2 (TWO) TIMES DAILY. 08/04/15   Lelon Perla, MD  cholecalciferol (VITAMIN D) 1000 UNITS tablet Take 3,000 Units by mouth daily.     Historical Provider, MD  Cyanocobalamin (VITAMIN B-12 PO) Take 1 tablet by mouth daily.    Historical Provider, MD  finasteride (PROSCAR) 5 MG tablet TAKE ONE TABLET BY MOUTH ONE TIME DAILY 02/29/16   [name], MD  furosemide (LASIX) 40 MG tablet TAKE ONE TABLET BY MOUTH ONE TIME DAILY 12/15/15   Lelon Perla, MD  gabapentin (NEURONTIN) 600 MG tablet Take 1 tablet (600 mg total) by mouth 3 (three) times daily. 01/11/16   Silverio Decamp, MD  ipratropium-albuterol (DUONEB) 0.5-2.5 (3) MG/3ML SOLN Take 3 mLs by nebulization every 2 (two) hours as needed (wheeze, SOB). 03/26/15   Emeterio Reeve, DO  losartan (COZAAR) 50 MG tablet TAKE ONE TABLET BY MOUTH ONE TIME DAILY 06/30/15   Lelon Perla, MD  Multiple Vitamin (MULTIVITAMIN) capsule Take 1 capsule by mouth daily.    Historical Provider, MD  Nebulizer MISC Generic nebulizer plus supplies 03/26/15   Emeterio Reeve, DO  ondansetron (ZOFRAN ODT) 4 MG disintegrating tablet Take one tab by mouth Q6hr prn nausea.  Dissolve under tongue. 03/16/16   Kandra Nicolas, MD  oseltamivir (TAMIFLU) 75 MG capsule Take 1 capsule (75 mg total) by mouth every 12 (twelve) hours. 03/16/16   Kandra Nicolas, MD  rosuvastatin (CRESTOR) 40 MG tablet TAKE 1 TABLET BY MOUTH EVERY DAY 08/26/15   Lelon Perla, MD  SYMBICORT 160-4.5 MCG/ACT inhaler INHALE TWO PUFFS BY MOUTH TWICE DAILY 09/09/15   [name], MD    Family History Family History  Problem Relation Age of Onset  . Heart attack Father 28  . Stroke Brother 10  . Hypertension Brother   . Hyperlipidemia Brother     Social History Social  History  Substance Use Topics  . Smoking status: Former Smoker    Packs/day: 1.00    Years: 60.00    Quit date: 10/08/2010  . Smokeless tobacco: Never Used  . Alcohol use Yes     Comment: Occasional     Allergies   Hctz [hydrochlorothiazide]   Review of Systems Review of Systems + sore throat + cough No pleuritic pain + substernal chest pain, resolved. + wheezing + nasal congestion + post-nasal drainage No sinus pain/pressure No itchy/red eyes No earache + dizzy No hemoptysis + SOB No fever, + chills + nausea + vomiting No abdominal pain No diarrhea No urinary symptoms No skin rash + fatigue + myalgias + headache Used OTC meds without relief   Physical Exam Triage Vital Signs ED Triage Vitals  Enc Vitals Group     BP 03/16/16 1428 135/77     Pulse Rate 03/16/16 1428 95  Resp 03/16/16 1428 20     Temp 03/16/16 1428 98.2 F (36.8 C)     Temp Source 03/16/16 1428 Oral     SpO2 03/16/16 1428 (!) 84 %     Weight 03/16/16 1428 210 lb (95.3 kg)     Height 03/16/16 1428 '5\' 9"'$  (1.753 m)     Head Circumference --      Peak Flow --      Pain Score 03/16/16 1433 0     Pain Loc --      Pain Edu? --      Excl. in [place]? --    No data found.   Updated Vital Signs BP 135/77 (BP Location: Left Arm)   Pulse 95   Temp 98.2 F (36.8 C) (Oral)   Resp 20   Ht '5\' 9"'$  (1.753 m)   Wt 210 lb (95.3 kg)   SpO2 (!) 84%   BMI 31.01 kg/m   Visual Acuity Right Eye Distance:   Left Eye Distance:   Bilateral Distance:    Right Eye Near:   Left Eye Near:    Bilateral Near:     Physical Exam Nursing notes and Vital Signs reviewed. Appearance:  Patient appears stated age.  He appears uncomfortable but in no acute distress.  He is alert and oriented, but has a "dusky" appearance. Eyes:  Pupils are equal, round, and reactive to light and accomodation.  Extraocular movement is intact.  Conjunctivae are not inflamed  Ears:  Canals normal.  Tympanic membranes normal.    Nose:  Mildly congested turbinates.  No sinus tenderness.   Pharynx:  Normal; moist mucous membranes  Neck:  Supple.  No adenopathy Lungs:  Clear to auscultation.  Breath sounds are equal.  Moving air well. Heart:  Regular rate and rhythm without murmurs, rubs, or gallops.  Rate 96 Abdomen:  Nontender without masses or hepatosplenomegaly.  Bowel sounds are present.  No CVA or flank tenderness.  Extremities:  Trace edema.  Skin:  No rash present.    UC Treatments / Results  Labs (all labs ordered are listed, but only abnormal results are displayed) Labs Reviewed - No data to display   Reviewed previous EKG done 06/19/15  EKG  EKG Interpretation   Rate:  92 BPM PR:  172 msec QT:  412 msec QTcH:  509 msec QRSD:  154 msec QRS axis:  93 degrees Interpretation:  Sinus rhythm with occasional PVCs.  RBBB.  Comparison with EKG performed 06/19/15 shows suspicious ST changes in V3-V6       Radiology No results found.  Procedures Procedures (including critical care time)  Medications Ordered in UC Medications - No data to display   Initial Impression / Assessment and Plan / UC Course  I have reviewed the triage vital signs and the nursing notes.  Pertinent labs & imaging results that were available during my care of the patient were reviewed by me and considered in my medical decision making (see chart for details).    Concern for new onset hypoxia (SpO2 84%) today, and suspicious ST changes on EKG not previously present.  Discussed with both patient and wife that I recommend transfer to [facility] ED by EMS for further evaluation/treatment.  Patient refused, and signed AMA (Informed Consent to Refuse) form.  Resume all medications when nausea improved. Rx for Tamiflu, and Zofran ODT '4mg'$  Continue all inhalers as prescribed. Take plain guaifenesin ('1200mg'$  extended release tabs such as Mucinex) twice daily, with adequate  amount of water, for cough and congestion.   Get  adequate rest.   Try warm salt water gargles for sore throat.  Stop all antihistamines for now, and other non-prescription cough/cold preparations. May take Delsym Cough Suppressant at bedtime for nighttime cough.  If symptoms become significantly worse during the night or over the weekend, proceed to the local emergency room or call 911. Followup with Family Doctor in two days.    Final Clinical Impressions(s) / UC Diagnoses   Final diagnoses:  Hypoxia  Influenza-like illness  COPD exacerbation ([place])  Nausea    New Prescriptions Discharge Medication List as of 03/16/2016  3:48 PM    START taking these medications   Details  ondansetron (ZOFRAN ODT) 4 MG disintegrating tablet Take one tab by mouth Q6hr prn nausea.  Dissolve under tongue., Print    oseltamivir (TAMIFLU) 75 MG capsule Take 1 capsule (75 mg total) by mouth every 12 (twelve) hours., Starting Wed 03/16/2016, Print         Kandra Nicolas, MD 03/16/16 [phone]

## 2016-03-16 NOTE — ED Triage Notes (Signed)
Pt c/o body aches, HA, vomiting, and runny nose x 1 day.

## 2016-03-17 ENCOUNTER — Observation Stay (HOSPITAL_COMMUNITY): Payer: Medicare PPO

## 2016-03-17 ENCOUNTER — Encounter (HOSPITAL_COMMUNITY): Payer: Self-pay | Admitting: Emergency Medicine

## 2016-03-17 ENCOUNTER — Telehealth: Payer: Self-pay | Admitting: Emergency Medicine

## 2016-03-17 ENCOUNTER — Inpatient Hospital Stay (HOSPITAL_COMMUNITY)
Admission: EM | Admit: 2016-03-17 | Discharge: 2016-03-21 | DRG: 193 | Disposition: A | Discharge status: Home Health | Payer: Medicare PPO | Attending: Internal Medicine | Admitting: Internal Medicine

## 2016-03-17 ENCOUNTER — Emergency Department (HOSPITAL_COMMUNITY): Payer: Medicare PPO

## 2016-03-17 DIAGNOSIS — I4891 Unspecified atrial fibrillation: Secondary | ICD-10-CM | POA: Diagnosis present

## 2016-03-17 DIAGNOSIS — Z87891 Personal history of nicotine dependence: Secondary | ICD-10-CM

## 2016-03-17 DIAGNOSIS — I248 Other forms of acute ischemic heart disease: Secondary | ICD-10-CM

## 2016-03-17 DIAGNOSIS — I252 Old myocardial infarction: Secondary | ICD-10-CM | POA: Diagnosis not present

## 2016-03-17 DIAGNOSIS — R0602 Shortness of breath: Secondary | ICD-10-CM | POA: Diagnosis present

## 2016-03-17 DIAGNOSIS — N400 Enlarged prostate without lower urinary tract symptoms: Secondary | ICD-10-CM | POA: Diagnosis present

## 2016-03-17 DIAGNOSIS — Z79899 Other long term (current) drug therapy: Secondary | ICD-10-CM

## 2016-03-17 DIAGNOSIS — I255 Ischemic cardiomyopathy: Secondary | ICD-10-CM | POA: Diagnosis present

## 2016-03-17 DIAGNOSIS — J9602 Acute respiratory failure with hypercapnia: Secondary | ICD-10-CM | POA: Diagnosis present

## 2016-03-17 DIAGNOSIS — R6889 Other general symptoms and signs: Secondary | ICD-10-CM

## 2016-03-17 DIAGNOSIS — J101 Influenza due to other identified influenza virus with other respiratory manifestations: Secondary | ICD-10-CM | POA: Diagnosis present

## 2016-03-17 DIAGNOSIS — J09X2 Influenza due to identified novel influenza A virus with other respiratory manifestations: Secondary | ICD-10-CM

## 2016-03-17 DIAGNOSIS — I2489 Other forms of acute ischemic heart disease: Secondary | ICD-10-CM

## 2016-03-17 DIAGNOSIS — Z9842 Cataract extraction status, left eye: Secondary | ICD-10-CM | POA: Diagnosis not present

## 2016-03-17 DIAGNOSIS — R0603 Acute respiratory distress: Secondary | ICD-10-CM

## 2016-03-17 DIAGNOSIS — E785 Hyperlipidemia, unspecified: Secondary | ICD-10-CM | POA: Diagnosis present

## 2016-03-17 DIAGNOSIS — Z6831 Body mass index (BMI) 31.0-31.9, adult: Secondary | ICD-10-CM

## 2016-03-17 DIAGNOSIS — I21A1 Myocardial infarction type 2: Secondary | ICD-10-CM | POA: Diagnosis present

## 2016-03-17 DIAGNOSIS — J1000 Influenza due to other identified influenza virus with unspecified type of pneumonia: Secondary | ICD-10-CM | POA: Diagnosis present

## 2016-03-17 DIAGNOSIS — I952 Hypotension due to drugs: Secondary | ICD-10-CM | POA: Diagnosis not present

## 2016-03-17 DIAGNOSIS — J111 Influenza due to unidentified influenza virus with other respiratory manifestations: Secondary | ICD-10-CM | POA: Diagnosis not present

## 2016-03-17 DIAGNOSIS — I110 Hypertensive heart disease with heart failure: Secondary | ICD-10-CM | POA: Diagnosis present

## 2016-03-17 DIAGNOSIS — G4733 Obstructive sleep apnea (adult) (pediatric): Secondary | ICD-10-CM | POA: Diagnosis present

## 2016-03-17 DIAGNOSIS — J440 Chronic obstructive pulmonary disease with acute lower respiratory infection: Secondary | ICD-10-CM | POA: Diagnosis present

## 2016-03-17 DIAGNOSIS — Z7982 Long term (current) use of aspirin: Secondary | ICD-10-CM

## 2016-03-17 DIAGNOSIS — T45515A Adverse effect of anticoagulants, initial encounter: Secondary | ICD-10-CM | POA: Diagnosis not present

## 2016-03-17 DIAGNOSIS — E209 Hypoparathyroidism, unspecified: Secondary | ICD-10-CM | POA: Diagnosis present

## 2016-03-17 DIAGNOSIS — I959 Hypotension, unspecified: Secondary | ICD-10-CM

## 2016-03-17 DIAGNOSIS — R1310 Dysphagia, unspecified: Secondary | ICD-10-CM | POA: Diagnosis present

## 2016-03-17 DIAGNOSIS — R739 Hyperglycemia, unspecified: Secondary | ICD-10-CM

## 2016-03-17 DIAGNOSIS — J438 Other emphysema: Secondary | ICD-10-CM

## 2016-03-17 DIAGNOSIS — G629 Polyneuropathy, unspecified: Secondary | ICD-10-CM | POA: Diagnosis present

## 2016-03-17 DIAGNOSIS — R451 Restlessness and agitation: Secondary | ICD-10-CM | POA: Diagnosis not present

## 2016-03-17 DIAGNOSIS — N179 Acute kidney failure, unspecified: Secondary | ICD-10-CM | POA: Diagnosis not present

## 2016-03-17 DIAGNOSIS — E669 Obesity, unspecified: Secondary | ICD-10-CM | POA: Diagnosis present

## 2016-03-17 DIAGNOSIS — J9601 Acute respiratory failure with hypoxia: Secondary | ICD-10-CM

## 2016-03-17 DIAGNOSIS — I5022 Chronic systolic (congestive) heart failure: Secondary | ICD-10-CM | POA: Diagnosis present

## 2016-03-17 DIAGNOSIS — J441 Chronic obstructive pulmonary disease with (acute) exacerbation: Secondary | ICD-10-CM | POA: Diagnosis present

## 2016-03-17 DIAGNOSIS — Z9841 Cataract extraction status, right eye: Secondary | ICD-10-CM | POA: Diagnosis not present

## 2016-03-17 DIAGNOSIS — R0600 Dyspnea, unspecified: Secondary | ICD-10-CM | POA: Diagnosis not present

## 2016-03-17 DIAGNOSIS — R748 Abnormal levels of other serum enzymes: Secondary | ICD-10-CM | POA: Diagnosis not present

## 2016-03-17 DIAGNOSIS — I25810 Atherosclerosis of coronary artery bypass graft(s) without angina pectoris: Secondary | ICD-10-CM | POA: Diagnosis present

## 2016-03-17 DIAGNOSIS — E860 Dehydration: Secondary | ICD-10-CM | POA: Diagnosis present

## 2016-03-17 DIAGNOSIS — I10 Essential (primary) hypertension: Secondary | ICD-10-CM | POA: Diagnosis present

## 2016-03-17 DIAGNOSIS — R05 Cough: Secondary | ICD-10-CM | POA: Diagnosis not present

## 2016-03-17 DIAGNOSIS — I25700 Atherosclerosis of coronary artery bypass graft(s), unspecified, with unstable angina pectoris: Secondary | ICD-10-CM | POA: Diagnosis not present

## 2016-03-17 DIAGNOSIS — I2510 Atherosclerotic heart disease of native coronary artery without angina pectoris: Secondary | ICD-10-CM | POA: Diagnosis present

## 2016-03-17 DIAGNOSIS — G9340 Encephalopathy, unspecified: Secondary | ICD-10-CM | POA: Diagnosis present

## 2016-03-17 DIAGNOSIS — Z951 Presence of aortocoronary bypass graft: Secondary | ICD-10-CM

## 2016-03-17 DIAGNOSIS — R791 Abnormal coagulation profile: Secondary | ICD-10-CM | POA: Diagnosis not present

## 2016-03-17 LAB — BLOOD GAS, ARTERIAL
Acid-base deficit: 0.9 mmol/L (ref 0.0–2.0)
Acid-base deficit: 0.7 mmol/L (ref 0.0–2.0)
Bicarbonate: 26.2 mmol/L (ref 20.0–28.0)
Bicarbonate: 25.6 mmol/L (ref 20.0–28.0)
DRAWN BY: 249101
Delivery systems: POSITIVE
Drawn by: 290171
Expiratory PAP: 6
FIO2: 100
FIO2: 60
Inspiratory PAP: 14
MODE: POSITIVE
O2 SAT: 89.9 %
O2 Saturation: 98 %
PCO2 ART: 59.9 mmHg — AB (ref 32.0–48.0)
PH ART: 7.254 — AB (ref 7.350–7.450)
PO2 ART: 63.9 mmHg — AB (ref 83.0–108.0)
Patient temperature: 98.6
Patient temperature: 98.6
RATE: 12 resp/min
pCO2 arterial: 68.8 mmHg (ref 32.0–48.0)
pH, Arterial: 7.205 — ABNORMAL LOW (ref 7.350–7.450)
pO2, Arterial: 144 mmHg — ABNORMAL HIGH (ref 83.0–108.0)

## 2016-03-17 LAB — PROCALCITONIN: Procalcitonin: 0.56 ng/mL

## 2016-03-17 LAB — URINALYSIS, ROUTINE W REFLEX MICROSCOPIC
Bilirubin Urine: NEGATIVE
GLUCOSE, UA: NEGATIVE mg/dL
KETONES UR: NEGATIVE mg/dL
Leukocytes, UA: NEGATIVE
NITRITE: NEGATIVE
PROTEIN: 30 mg/dL — AB
Specific Gravity, Urine: 1.018 (ref 1.005–1.030)
Squamous Epithelial / LPF: NONE SEEN
pH: 5 (ref 5.0–8.0)

## 2016-03-17 LAB — CBC
HCT: 44.5 % (ref 39.0–52.0)
Hemoglobin: 14.1 g/dL (ref 13.0–17.0)
MCH: 29.8 pg (ref 26.0–34.0)
MCHC: 31.7 g/dL (ref 30.0–36.0)
MCV: 94.1 fL (ref 78.0–100.0)
PLATELETS: 104 10*3/uL — AB (ref 150–400)
RBC: 4.73 MIL/uL (ref 4.22–5.81)
RDW: 14.1 % (ref 11.5–15.5)
WBC: 10.3 10*3/uL (ref 4.0–10.5)

## 2016-03-17 LAB — BASIC METABOLIC PANEL
Anion gap: 12 (ref 5–15)
BUN: 48 mg/dL — AB (ref 6–20)
CALCIUM: 8.7 mg/dL — AB (ref 8.9–10.3)
CO2: 27 mmol/L (ref 22–32)
CREATININE: 2.4 mg/dL — AB (ref 0.61–1.24)
Chloride: 103 mmol/L (ref 101–111)
GFR calc Af Amer: 28 mL/min — ABNORMAL LOW (ref >60)
GFR calc non Af Amer: 24 mL/min — ABNORMAL LOW (ref >60)
Glucose, Bld: 115 mg/dL — ABNORMAL HIGH (ref 65–99)
Potassium: 4.3 mmol/L (ref 3.5–5.1)
Sodium: 142 mmol/L (ref 135–145)

## 2016-03-17 LAB — I-STAT ARTERIAL BLOOD GAS, ED
Acid-base deficit: 1 mmol/L (ref 0.0–2.0)
Bicarbonate: 26.3 mmol/L (ref 20.0–28.0)
O2 SAT: 85 %
PCO2 ART: 52 mmHg — AB (ref 32.0–48.0)
PH ART: 7.312 — AB (ref 7.350–7.450)
TCO2: 28 mmol/L (ref 0–100)
pO2, Arterial: 55 mmHg — ABNORMAL LOW (ref 83.0–108.0)

## 2016-03-17 LAB — COMPREHENSIVE METABOLIC PANEL
ALBUMIN: 4.1 g/dL (ref 3.5–5.0)
ALT: 43 U/L (ref 17–63)
ANION GAP: 15 (ref 5–15)
AST: 63 U/L — AB (ref 15–41)
Alkaline Phosphatase: 39 U/L (ref 38–126)
BUN: 44 mg/dL — AB (ref 6–20)
CHLORIDE: 102 mmol/L (ref 101–111)
CO2: 24 mmol/L (ref 22–32)
Calcium: 9.3 mg/dL (ref 8.9–10.3)
Creatinine, Ser: 3.24 mg/dL — ABNORMAL HIGH (ref 0.61–1.24)
GFR calc Af Amer: 19 mL/min — ABNORMAL LOW (ref >60)
GFR, EST NON AFRICAN AMERICAN: 17 mL/min — AB (ref >60)
GLUCOSE: 118 mg/dL — AB (ref 65–99)
POTASSIUM: 4.9 mmol/L (ref 3.5–5.1)
Sodium: 141 mmol/L (ref 135–145)
TOTAL PROTEIN: 6.5 g/dL (ref 6.5–8.1)
Total Bilirubin: 1 mg/dL (ref 0.3–1.2)

## 2016-03-17 LAB — I-STAT CG4 LACTIC ACID, ED: LACTIC ACID, VENOUS: 2.04 mmol/L — AB (ref 0.5–1.9)

## 2016-03-17 LAB — INFLUENZA PANEL BY PCR (TYPE A & B)
INFLAPCR: POSITIVE — AB
INFLBPCR: NEGATIVE

## 2016-03-17 LAB — GLUCOSE, CAPILLARY
GLUCOSE-CAPILLARY: 101 mg/dL — AB (ref 65–99)
GLUCOSE-CAPILLARY: 112 mg/dL — AB (ref 65–99)
GLUCOSE-CAPILLARY: 110 mg/dL — AB (ref 65–99)
Glucose-Capillary: 88 mg/dL (ref 65–99)

## 2016-03-17 LAB — CBC WITH DIFFERENTIAL/PLATELET
BASOS ABS: 0 10*3/uL (ref 0.0–0.1)
BASOS PCT: 0 %
EOS PCT: 0 %
Eosinophils Absolute: 0 10*3/uL (ref 0.0–0.7)
HCT: 47.4 % (ref 39.0–52.0)
Hemoglobin: 15.3 g/dL (ref 13.0–17.0)
Lymphocytes Relative: 8 %
Lymphs Abs: 0.8 10*3/uL (ref 0.7–4.0)
MCH: 30.4 pg (ref 26.0–34.0)
MCHC: 32.3 g/dL (ref 30.0–36.0)
MCV: 94 fL (ref 78.0–100.0)
MONO ABS: 1.3 10*3/uL — AB (ref 0.1–1.0)
MONOS PCT: 13 %
Neutro Abs: 8.4 10*3/uL — ABNORMAL HIGH (ref 1.7–7.7)
Neutrophils Relative %: 79 %
PLATELETS: 116 10*3/uL — AB (ref 150–400)
RBC: 5.04 MIL/uL (ref 4.22–5.81)
RDW: 14.3 % (ref 11.5–15.5)
WBC: 10.5 10*3/uL (ref 4.0–10.5)

## 2016-03-17 LAB — TROPONIN I
TROPONIN I: 3.98 ng/mL — AB (ref <0.03)
TROPONIN I: 4.92 ng/mL — AB (ref <0.03)

## 2016-03-17 LAB — MRSA PCR SCREENING: MRSA by PCR: NEGATIVE

## 2016-03-17 LAB — LACTIC ACID, PLASMA: Lactic Acid, Venous: 1.4 mmol/L (ref 0.5–1.9)

## 2016-03-17 MED ORDER — ONDANSETRON HCL 4 MG PO TABS: 4.0000 mg | ORAL_TABLET | Freq: Four times a day (QID) | ORAL | Status: DC | PRN

## 2016-03-17 MED ORDER — ASPIRIN EC 81 MG PO TBEC: 81.0000 mg | DELAYED_RELEASE_TABLET | Freq: Every day | ORAL | Status: DC

## 2016-03-17 MED ORDER — ALBUTEROL SULFATE (2.5 MG/3ML) 0.083% IN NEBU: 5.0000 mg | INHALATION_SOLUTION | RESPIRATORY_TRACT | Status: DC | PRN

## 2016-03-17 MED ORDER — ONDANSETRON HCL 4 MG/2ML IJ SOLN
4.0000 mg | Freq: Four times a day (QID) | INTRAMUSCULAR | Status: DC | PRN
  Administered 2016-03-17: 4 mg via INTRAVENOUS
  Filled 2016-03-17: qty 2

## 2016-03-17 MED ORDER — ALBUTEROL SULFATE (2.5 MG/3ML) 0.083% IN NEBU
5.0000 mg | INHALATION_SOLUTION | Freq: Once | RESPIRATORY_TRACT | Status: AC
  Administered 2016-03-17: 5 mg via RESPIRATORY_TRACT
  Filled 2016-03-17: qty 6

## 2016-03-17 MED ORDER — ALBUTEROL SULFATE (2.5 MG/3ML) 0.083% IN NEBU
2.5000 mg | INHALATION_SOLUTION | Freq: Four times a day (QID) | RESPIRATORY_TRACT | Status: DC
  Administered 2016-03-18 – 2016-03-21 (×13): 2.5 mg via RESPIRATORY_TRACT
  Filled 2016-03-17 (×14): qty 3

## 2016-03-17 MED ORDER — BUDESONIDE 0.5 MG/2ML IN SUSP
0.5000 mg | Freq: Two times a day (BID) | RESPIRATORY_TRACT | Status: DC
  Administered 2016-03-18 – 2016-03-21 (×6): 0.5 mg via RESPIRATORY_TRACT
  Filled 2016-03-17 (×9): qty 2

## 2016-03-17 MED ORDER — GABAPENTIN 600 MG PO TABS: 600.0000 mg | ORAL_TABLET | Freq: Three times a day (TID) | ORAL | Status: DC

## 2016-03-17 MED ORDER — ACETAMINOPHEN 325 MG PO TABS: 650.0000 mg | ORAL_TABLET | Freq: Four times a day (QID) | ORAL | Status: DC | PRN

## 2016-03-17 MED ORDER — HEPARIN BOLUS VIA INFUSION
4000.0000 [IU] | Freq: Once | INTRAVENOUS | Status: AC
  Administered 2016-03-17: 4000 [IU] via INTRAVENOUS
  Filled 2016-03-17: qty 4000

## 2016-03-17 MED ORDER — ASPIRIN EC 325 MG PO TBEC
325.0000 mg | DELAYED_RELEASE_TABLET | Freq: Every day | ORAL | Status: DC
  Administered 2016-03-18 – 2016-03-21 (×4): 325 mg via ORAL
  Filled 2016-03-17 (×5): qty 1

## 2016-03-17 MED ORDER — SODIUM CHLORIDE 0.9 % IV SOLN
INTRAVENOUS | Status: AC
  Administered 2016-03-17: 22:00:00 via INTRAVENOUS

## 2016-03-17 MED ORDER — OSELTAMIVIR PHOSPHATE 75 MG PO CAPS
75.0000 mg | ORAL_CAPSULE | Freq: Two times a day (BID) | ORAL | Status: DC
Start: 2016-03-17 — End: 2016-03-18
  Filled 2016-03-17 (×2): qty 1

## 2016-03-17 MED ORDER — PHENOL 1.4 % MT LIQD: 1.0000 | OROMUCOSAL | Status: DC | PRN

## 2016-03-17 MED ORDER — SODIUM CHLORIDE 0.9% FLUSH
3.0000 mL | Freq: Two times a day (BID) | INTRAVENOUS | Status: DC
  Administered 2016-03-18 – 2016-03-21 (×7): 3 mL via INTRAVENOUS

## 2016-03-17 MED ORDER — MOMETASONE FURO-FORMOTEROL FUM 200-5 MCG/ACT IN AERO
2.0000 | INHALATION_SPRAY | Freq: Two times a day (BID) | RESPIRATORY_TRACT | Status: DC
  Filled 2016-03-17: qty 8.8

## 2016-03-17 MED ORDER — ASPIRIN 300 MG RE SUPP
300.0000 mg | Freq: Once | RECTAL | Status: AC
  Administered 2016-03-18: 300 mg via RECTAL
  Filled 2016-03-17: qty 1

## 2016-03-17 MED ORDER — DEXTROSE 5 % IV SOLN
2.0000 g | INTRAVENOUS | Status: DC
  Administered 2016-03-18 – 2016-03-21 (×3): 2 g via INTRAVENOUS
  Filled 2016-03-17 (×4): qty 2

## 2016-03-17 MED ORDER — IPRATROPIUM-ALBUTEROL 0.5-2.5 (3) MG/3ML IN SOLN: 3.0000 mL | RESPIRATORY_TRACT | Status: DC | PRN

## 2016-03-17 MED ORDER — SODIUM CHLORIDE 0.9 % IV BOLUS (SEPSIS)
1000.0000 mL | Freq: Once | INTRAVENOUS | Status: AC
  Administered 2016-03-17: 1000 mL via INTRAVENOUS

## 2016-03-17 MED ORDER — METHYLPREDNISOLONE SODIUM SUCC 40 MG IJ SOLR
40.0000 mg | Freq: Two times a day (BID) | INTRAMUSCULAR | Status: AC
  Administered 2016-03-18 (×3): 40 mg via INTRAVENOUS
  Filled 2016-03-17 (×4): qty 1

## 2016-03-17 MED ORDER — ACETAMINOPHEN 650 MG RE SUPP: 650.0000 mg | Freq: Four times a day (QID) | RECTAL | Status: DC | PRN

## 2016-03-17 MED ORDER — ALBUTEROL SULFATE (2.5 MG/3ML) 0.083% IN NEBU: 2.5000 mg | INHALATION_SOLUTION | RESPIRATORY_TRACT | Status: DC | PRN

## 2016-03-17 MED ORDER — DEXTROSE 5 % IV SOLN
500.0000 mg | INTRAVENOUS | Status: DC
  Administered 2016-03-17: 500 mg via INTRAVENOUS
  Filled 2016-03-17 (×2): qty 500

## 2016-03-17 MED ORDER — DEXTROSE 5 % IV SOLN
1.0000 g | INTRAVENOUS | Status: DC
  Filled 2016-03-17 (×2): qty 10

## 2016-03-17 MED ORDER — HEPARIN SODIUM (PORCINE) 5000 UNIT/ML IJ SOLN: 5000.0000 [IU] | Freq: Three times a day (TID) | INTRAMUSCULAR | Status: DC

## 2016-03-17 MED ORDER — FINASTERIDE 5 MG PO TABS
5.0000 mg | ORAL_TABLET | Freq: Every day | ORAL | Status: DC
  Administered 2016-03-18 – 2016-03-21 (×4): 5 mg via ORAL
  Filled 2016-03-17 (×4): qty 1

## 2016-03-17 MED ORDER — HEPARIN (PORCINE) IN NACL 100-0.45 UNIT/ML-% IJ SOLN
750.0000 [IU]/h | INTRAMUSCULAR | Status: DC
  Administered 2016-03-17: 1150 [IU]/h via INTRAVENOUS
  Administered 2016-03-18: 900 [IU]/h via INTRAVENOUS
  Filled 2016-03-17 (×4): qty 250

## 2016-03-17 MED ORDER — SODIUM CHLORIDE 0.9 % IV SOLN
INTRAVENOUS | Status: DC
  Administered 2016-03-17: 17:00:00 via INTRAVENOUS

## 2016-03-17 MED ORDER — ARFORMOTEROL TARTRATE 15 MCG/2ML IN NEBU
15.0000 ug | INHALATION_SOLUTION | Freq: Two times a day (BID) | RESPIRATORY_TRACT | Status: DC
  Administered 2016-03-18 – 2016-03-21 (×6): 15 ug via RESPIRATORY_TRACT
  Filled 2016-03-17 (×9): qty 2

## 2016-03-17 MED ORDER — ROSUVASTATIN CALCIUM 20 MG PO TABS
40.0000 mg | ORAL_TABLET | Freq: Every day | ORAL | Status: DC
  Administered 2016-03-18 – 2016-03-21 (×4): 40 mg via ORAL
  Filled 2016-03-17: qty 2
  Filled 2016-03-17 (×2): qty 1
  Filled 2016-03-17 (×2): qty 2

## 2016-03-17 NOTE — Telephone Encounter (Signed)
Checking to see if he went to the hospital. Please call us back

## 2016-03-17 NOTE — Progress Notes (Signed)
   03/17/16 2055  BiPAP/CPAP/SIPAP  BiPAP/CPAP/SIPAP Pt Type Adult  Mask Type Full face mask  Mask Size Large  Set Rate 12 breaths/min  Respiratory Rate 33 breaths/min  IPAP 14 cmH20  EPAP 6 cmH2O  Oxygen Percent 60 %  Flow Rate 0 lpm  Minute Ventilation 18.1  Leak 49  Peak Inspiratory Pressure (PIP) 15  Tidal Volume (Vt) 609  BiPAP/CPAP/SIPAP BiPAP  Patient Home Equipment No  Auto Titrate No  Patient placed on Bipap post ABG result and due to confusion. Zofran given by RN due earlier episode of nausea and vomiting. He is tolerating the Bipap very well at this time.

## 2016-03-17 NOTE — Progress Notes (Signed)
ANTICOAGULATION CONSULT NOTE - Initial Consult  Pharmacy Consult for heparin Indication: chest pain/ACS  Allergies  Allergen Reactions  . Hctz [Hydrochlorothiazide] Other (See Comments)    Affected renal function.     Patient Measurements:   Heparin Dosing Weight: 90 kg  Vital Signs: Temp: 98.8 F (37.1 C) (02/08 1607) Temp Source: Oral (02/08 1607) BP: 112/57 (02/08 1607) Pulse Rate: 87 (02/08 1607)  Labs:  Recent Labs  03/17/16 1315 03/17/16 1834  HGB 15.3  --   HCT 47.4  --   PLT 116*  --   CREATININE 3.24*  --   TROPONINI  --  3.98*    Estimated Creatinine Clearance: 20.7 mL/min (by C-G formula based on SCr of 3.24 mg/dL (H)).   Medical History: Past Medical History:  Diagnosis Date  . BPH (benign prostatic hyperplasia)   . CAD (coronary artery disease)    a. s/p CABG x1 with LIMA-LAD 1994. b. LM & 3v CAD by cath 07/2012, pending CABG.  . CHF (congestive heart failure) ([place])    a. EF 35-40% by echo 07/2012.  [name] Complication of anesthesia    affected patients memory. Pt stated "I couldn't remember anything for three months...just bits and pieces"  . Emphysema    a. Moderate emphysema by CT 06/2012.  [name] Heart attack 1994  . Hyperlipidemia   . Hypertension   . Hypoparathyroidism ([place])   . NSVT (nonsustained ventricular tachycardia) ([place])    a. Seen on tele 07/2012.  . OSA on CPAP    pt stated he does not wear CPAP because he no longer has sleep apnea  . Pneumonia   . Pseudoaneurysm of left femoral artery ([place])    a. post cath s/p compression 07/2012, associated w/ anemia.  . Sciatica 2012   Qualifier: Diagnosis of  By: Madilyn Fireman MD, [place]      Assessment: Pt was admitted with cough and SOB. He has a known history of CAD and HF. Pt is also flu positive. Troponin came back elevated at 3.9 today. Pt is also noted to have AKI, with SCr up to 3.2, eCrCl 20 ml/min. Will start heparin infusion for ACS. CBC from early today was stable with mild  thrombocytopenia.   Goal of Therapy:  Heparin level 0.3-0.7 units/ml Monitor platelets by anticoagulation protocol: Yes    Plan:  -Heparin bolus 4000 units x1 then 1150 units/hr -Daily HL, CBC -First level tomorrow morning -F/u cardiology plans   Harvel Quale 03/17/2016,9:04 PM

## 2016-03-17 NOTE — Progress Notes (Addendum)
Shift event: Paged by my colleague earlier in shift to go see Joseph Berg on 5N due to respiratory distress. ABG and CXR had been ordered. NP immediately to bedside. S: Denies pain. Endorses SOB x 2 days. Other than that, he is confused and can not give a detailed history.  Brief HPI: Pt admitted on days today with acute hypoxic respiratory failure secondary to viral illness. Saw PCP yesterday and was + flu and placed on Tamiflu. Was hypoxic at PCP office and PCP wanted pt to come to ED, but pt refused. Today, presented with SOB, viral type illness sx, no CP. Hypoxic in ED with PO2 of 55, pH 7.3, PCO2 of 52. Right before bipap placed, he vomited, so pt placed on 6L [place]. Seemed to respond well to that. However, when on the floor, he desatted on 6L and became very agitated and restless. Placed on NRB. RRRN at bedside.  Also, here with AKI. Baseline creat normal. Hx CHF with EF 40% on 2015 echo. CAD s/p CABG 1994 and 2014. Dr. Stanford Breed is his cardiologist.  O: Acutely ill appearing obese WM in moderate respiratory distress. Agitated, moving about in bed. Doesn't recognize his wife. Disoriented other wise as well. T 102F rectally. BP 150s. SaO2 100% on partial NRB. Lungs are diminished at bases. Otherwise, no crackles, rales or rhonchi. Does not appear to be overloaded. Card: RRR. No LE edema noted. Voided x 1.  A/P: 1. Acute hypoxic respiratory failure secondary to viral illness and now with likely PNA on CXR. Zofran given to prevent nausea with bipap and bipap applied by RT on floor. Pt tolerating Bipap well and calmed down after placement. TF to ICU in case pt does not tolerate Bipap, he may need to be tubed. FULL CODE verified with wife. OK to TF to ICU per Dr. Chase Caller of PCCM. 2. CAP-start abx. Supportive care. CBC ordered. LA ordered. 3. Hx emphysema. Nebs. No wheezing.  4. Hx CHF with EF 40%. Seems compensated at present. R/p Echo in am.  5. AKI-creat is 3.2, usually normal. Needs hydration. IVF at  100cc hr for now and watch for overload. May need further testing if creat doesn't improve. Holding Lasix, ARB. Labs ordered.  6. Hx CAD, CABG x2. In review of chart during above incident, NP discovered 1830 hrs troponin was elevated at 3.98. Pt has no c/o chest pain since admission. Start Heparin. Pt takes Coreg, Crestor, Lasix, Cozaar and ASA '81mg'$  at home. NP attempted to call Dr. Percival Spanish of cardiology (on call), but no call back.  7. Flu+. Tamiflu when off Bipap. Supportive care.  PCCM to take over care and will see pt shortly.  KJKG, NP [facility] Addendum: When NP didn't hear back from cardiology, NP called PCCM to say NP was unable to speak with cardiology. Dr. Lake Bells was paging cardiology at that time. Spoke with Dr. Percival Spanish later who stated he did not get this NP's page earlier.  KJKG, NP [facility]

## 2016-03-17 NOTE — ED Triage Notes (Signed)
Seen at [facility] yesterday and dx with  Flu wife states pt is no better, and pt is  Sob

## 2016-03-17 NOTE — H&P (Signed)
History and Physical    Joseph Berg MLY:[id number] DOB: [birth year] DOA: 03/17/2016  PCP: Beatrice Lecher, MD Patient coming from: Home  Chief Complaint: SOB  HPI: Joseph Berg is a 83 y.o. male with medical history significant of BPH, CAD status post CABG, CHF with an EF of 35%, hyperlipidemia, hypertension, hypoparathyroidism, sciatica presenting w/ progressive dry cough and shortness of breath. Started on 03/15/2016. Seen by primary care physician one day ago and diagnosed with influenza and started on Tamiflu. At that time patient was counseled to go to the emergency room due to hypoxemia with O2 saturations of 84%. Patient refused and went home. Patient's symptoms continue to worsen with associated fatigue, myalgias, nausea and single episode of nonbilious nonbloody emesis. Patient does endorse decreased oral intake with very little over the last 3 days. Subjective fevers. Denies chest pain, palpitations, abdominal pain, neck stiffness, dysuria, frequency, diarrhea, rash.   ED Course: Albuterol given with improvement in subjective respiratory symptoms.  Review of Systems: As per HPI otherwise 10 point review of systems negative.   Ambulatory Status: No restrictions  Past Medical History:  Diagnosis Date  . BPH (benign prostatic hyperplasia)   . CAD (coronary artery disease)    a. s/p CABG x1 with LIMA-LAD 1994. b. LM & 3v CAD by cath 07/2012, pending CABG.  . CHF (congestive heart failure) ([place])    a. EF 35-40% by echo 07/2012.  [name] Complication of anesthesia    affected patients memory. Pt stated "I couldn't remember anything for three months...just bits and pieces"  . Emphysema    a. Moderate emphysema by CT 06/2012.  [name] Heart attack 1994  . Hyperlipidemia   . Hypertension   . Hypoparathyroidism ([place])   . NSVT (nonsustained ventricular tachycardia) ([place])    a. Seen on tele 07/2012.  . OSA on CPAP    pt stated he does not wear CPAP because he no longer has sleep  apnea  . Pneumonia   . Pseudoaneurysm of left femoral artery ([place])    a. post cath s/p compression 07/2012, associated w/ anemia.  . Sciatica 2012   Qualifier: Diagnosis of  By: Madilyn Fireman MD, Barnetta Chapel      Past Surgical History:  Procedure Laterality Date  . CATARACT EXTRACTION, BILATERAL    . COLONOSCOPY    . CORONARY ARTERY BYPASS GRAFT  03-23-92  . CORONARY ARTERY BYPASS GRAFT N/A 08/14/2012   Procedure: REDO CORONARY ARTERY BYPASS GRAFTING (CABG);  Surgeon: Gaye Pollack, MD;  Location: [facility];  Service: Open Heart Surgery;  Laterality: N/A;  . INTRAOPERATIVE TRANSESOPHAGEAL ECHOCARDIOGRAM N/A 08/14/2012   Procedure: INTRAOPERATIVE TRANSESOPHAGEAL ECHOCARDIOGRAM;  Surgeon: Gaye Pollack, MD;  Location: [facility];  Service: Open Heart Surgery;  Laterality: N/A;  . LUMBAR LAMINECTOMY/DECOMPRESSION MICRODISCECTOMY Left 02/18/2015   Procedure: LUMBAR LAMINECTOMY/DECOMPRESSION MICRODISCECTOMY- LEFT FOUR-FIVE ;  Surgeon: Ashok Pall, MD;  Location: [facility];  Service: Neurosurgery;  Laterality: Left;    Social History   Social History  . Marital status: Married    Spouse name: N/A  . Number of children: N/A  . Years of education: N/A   Occupational History  . Not on file.   Social History Main Topics  . Smoking status: Former Smoker    Packs/day: 1.00    Years: 60.00    Quit date: 10/08/2010  . Smokeless tobacco: Never Used  . Alcohol use Yes     Comment: Occasional  . Drug use: No  . Sexual activity: Not on file  Comment: works part-time, Conservation officer, [employer] completed H, married, no children, regular exercise.S   Other Topics Concern  . Not on file   Social History Narrative  . No narrative on file    Allergies  Allergen Reactions  . Hctz [Hydrochlorothiazide] Other (See Comments)    Affected renal function.     Family History  Problem Relation Age of Onset  . Heart attack Father 69  . Stroke Brother 60  . Hypertension Brother   . Hyperlipidemia Brother      Prior to Admission medications   Medication Sig Start Date End Date Taking? Authorizing Provider  acetaminophen (TYLENOL) 500 MG tablet Take 1-2 tablets (500-1,000 mg total) by mouth every 6 (six) hours as needed for pain. 08/03/12   Dayna N Dunn, PA-C  Ascorbic Acid (VITAMIN C) 1000 MG tablet Take 1,000 mg by mouth daily.    Historical Provider, MD  aspirin 81 MG tablet Take 81 mg by mouth daily.    Historical Provider, MD  carvedilol (COREG) 12.5 MG tablet TAKE 1 TABLET (12.5 MG TOTAL) BY MOUTH 2 (TWO) TIMES DAILY. 08/04/15   Lelon Perla, MD  cholecalciferol (VITAMIN D) 1000 UNITS tablet Take 3,000 Units by mouth daily.     Historical Provider, MD  Cyanocobalamin (VITAMIN B-12 PO) Take 1 tablet by mouth daily.    Historical Provider, MD  finasteride (PROSCAR) 5 MG tablet TAKE ONE TABLET BY MOUTH ONE TIME DAILY 02/29/16   [name], MD  furosemide (LASIX) 40 MG tablet TAKE ONE TABLET BY MOUTH ONE TIME DAILY 12/15/15   Lelon Perla, MD  gabapentin (NEURONTIN) 600 MG tablet Take 1 tablet (600 mg total) by mouth 3 (three) times daily. 01/11/16   Silverio Decamp, MD  ipratropium-albuterol (DUONEB) 0.5-2.5 (3) MG/3ML SOLN Take 3 mLs by nebulization every 2 (two) hours as needed (wheeze, SOB). 03/26/15   Emeterio Reeve, DO  losartan (COZAAR) 50 MG tablet TAKE ONE TABLET BY MOUTH ONE TIME DAILY 06/30/15   Lelon Perla, MD  Multiple Vitamin (MULTIVITAMIN) capsule Take 1 capsule by mouth daily.    Historical Provider, MD  Nebulizer MISC Generic nebulizer plus supplies 03/26/15   Emeterio Reeve, DO  ondansetron (ZOFRAN ODT) 4 MG disintegrating tablet Take one tab by mouth Q6hr prn nausea.  Dissolve under tongue. 03/16/16   Kandra Nicolas, MD  oseltamivir (TAMIFLU) 75 MG capsule Take 1 capsule (75 mg total) by mouth every 12 (twelve) hours. 03/16/16   Kandra Nicolas, MD  rosuvastatin (CRESTOR) 40 MG tablet TAKE 1 TABLET BY MOUTH EVERY DAY 08/26/15   Lelon Perla, MD   SYMBICORT 160-4.5 MCG/ACT inhaler INHALE TWO PUFFS BY MOUTH TWICE DAILY 09/09/15   [name], MD    Physical Exam: Vitals:   03/17/16 1500 03/17/16 1525 03/17/16 1543 03/17/16 1607  BP:  100/63 98/60 (!) 112/57  Pulse:    87  Resp:    16  Temp:    98.8 F (37.1 C)  TempSrc:    Oral  SpO2: 96%        General:  Appears calm and comfortable, sleepy Eyes:  PERRL, EOMI, normal lids, iris ENT:  grossly normal hearing, lips & tongue, mmm Neck:  no LAD, masses or thyromegaly Cardiovascular:  RRR, no m/r/g. 1-2+ LE edema Respiratory: A few intermittent crackles and intermittent wheezing. Increased effort. O2 saturations confirmed 65% with multiple pulse oximeters with good waveform. Abdomen:  soft, ntnd, NABS Skin:  no rash or induration seen  on limited exam Musculoskeletal:  grossly normal tone BUE/BLE, good ROM, no bony abnormality Psychiatric: Patient very sleepy while on room air with improvement in mental status after being put on 4 L nasal cannula. speech fluent and appropriate, AOx3 Neurologic:  CN 2-12 grossly intact, moves all extremities in coordinated fashion, sensation intact  Labs on Admission: I have personally reviewed following labs and imaging studies  CBC:  Recent Labs Lab 03/17/16 1315  WBC 10.5  NEUTROABS 8.4*  HGB 15.3  HCT 47.4  MCV 94.0  PLT 660*   Basic Metabolic Panel:  Recent Labs Lab 03/17/16 1315  NA 141  K 4.9  CL 102  CO2 24  GLUCOSE 118*  BUN 44*  CREATININE 3.24*  CALCIUM 9.3   GFR: Estimated Creatinine Clearance: 20.7 mL/min (by C-G formula based on SCr of 3.24 mg/dL (H)). Liver Function Tests:  Recent Labs Lab 03/17/16 1315  AST 63*  ALT 43  ALKPHOS 39  BILITOT 1.0  PROT 6.5  ALBUMIN 4.1   No results for input(s): LIPASE, AMYLASE in the last 168 hours. No results for input(s): AMMONIA in the last 168 hours. Coagulation Profile: No results for input(s): INR, PROTIME in the last 168 hours. Cardiac Enzymes: No  results for input(s): CKTOTAL, CKMB, CKMBINDEX, TROPONINI in the last 168 hours. BNP (last 3 results) No results for input(s): PROBNP in the last 8760 hours. HbA1C: No results for input(s): HGBA1C in the last 72 hours. CBG: No results for input(s): GLUCAP in the last 168 hours. Lipid Profile: No results for input(s): CHOL, HDL, LDLCALC, TRIG, CHOLHDL, LDLDIRECT in the last 72 hours. Thyroid Function Tests: No results for input(s): TSH, T4TOTAL, FREET4, T3FREE, THYROIDAB in the last 72 hours. Anemia Panel: No results for input(s): VITAMINB12, FOLATE, FERRITIN, TIBC, IRON, RETICCTPCT in the last 72 hours. Urine analysis:    Component Value Date/Time   COLORURINE YELLOW 08/09/2012 1316   APPEARANCEUR CLEAR 08/09/2012 1316   LABSPEC 1.016 08/09/2012 1316   PHURINE 7.0 08/09/2012 1316   GLUCOSEU NEGATIVE 08/09/2012 1316   HGBUR NEGATIVE 08/09/2012 1316   BILIRUBINUR NEGATIVE 08/09/2012 1316   BILIRUBINUR neg 10/31/2011 1121   KETONESUR NEGATIVE 08/09/2012 1316   PROTEINUR NEGATIVE 08/09/2012 1316   UROBILINOGEN 2.0 (H) 08/09/2012 1316   NITRITE NEGATIVE 08/09/2012 1316   LEUKOCYTESUR NEGATIVE 08/09/2012 1316    Creatinine Clearance: Estimated Creatinine Clearance: 20.7 mL/min (by C-G formula based on SCr of 3.24 mg/dL (H)).  Sepsis Labs: '@LABRCNTIP'$ (procalcitonin:4,lacticidven:4) )No results found for this or any previous visit (from the past 240 hour(s)).   Radiological Exams on Admission: Dg Chest 2 View  Result Date: 03/17/2016 CLINICAL DATA:  Shortness of breath, nausea and vomiting cough for the past day. History of CAD, CHF an pneumonia. Former smoker. EXAM: CHEST  2 VIEW COMPARISON:  03/26/2015; 12/13/2013 FINDINGS: Grossly unchanged enlarged cardiac silhouette and mediastinal contours post median sternotomy and CABG. There is unchanged tortuosity and potential ectasia of the thoracic aorta. Atherosclerotic plaque within the thoracic aorta. The lungs remain hyperexpanded with  flattening of the diaphragms. Veiling opacities overlying the bilateral lower lungs are unchanged in favored to represent overlying soft tissues. Unchanged pleuroparenchymal thickening about the bilateral major and the right minor fissures. No evidence of edema. No acute osseus abnormalities. Old/ healed fractures involving the posterolateral aspects of the left fourth and fifth ribs. Stigmata DISH within thoracic spine. IMPRESSION: 1. Similar findings of cardiomegaly, lung hyperexpansion and bronchitic change without superimposed acute cardiopulmonary disease. 2.  Aortic Atherosclerosis (ICD10-170.0) 3.  Old left-sided rib fractures. Electronically Signed   By: Sandi Mariscal M.D.   On: 03/17/2016 13:52    EKG: Independently reviewed. Sinus. RBBB, no ACS  Assessment/Plan Active Problems:   HYPERTENSION, BENIGN ESSENTIAL   BPH (benign prostatic hyperplasia)   CAD (coronary artery disease) of artery bypass graft   Chronic systolic congestive heart failure (HCC)   S/P CABG x 2   AKI (acute kidney injury) ([place])   Other emphysema (HCC)   Acute respiratory failure with hypoxia (HCC)   Hypotension   Hyperglycemia   Acute Respiratory Failure: Unclear etiology but concern for viral illness such as flu. Patient responds well to nasal cannula with normalization of O2 saturations on 4 L. Few wheezes and crackles bilaterally in the lungs. After IV hydration will repeat chest x-ray in the a.m. ABG showing a pH of 7.31, PCO2 52, PaO2 55, bicarbonate 26.  -Duonebs when necessary - Tamiflu (8 more doses ordered) - Pro calcitonin - Respiratory panel  Acute kidney injury: Creatinine 3.24. Baseline 0.9. Patient with little to no oral intake over the last 3 days suspect this is all from dehydration. No difficulty with passing urine, dysuria or frequency.  - IVF - BMP in a.m. - More extensive workup if renal function is not improving - UA, bladder scan  Hypotension: Likely secondary to continuation of home  blood pressure medications and diuretics in the setting of little to no fluid intake for 3 days. - Hold coreg, Lasix, cozaar  Hyperglycemia: 118 on admission. No h/o DM - CBG AC HS - A1c  CAD/CABG: no CP. EKG nml - continue crestor, ASA - Trop x1  Chronic systolic congestive heart failure: Last echo from 2015 showing EF of 18% and mild diastolic dysfunction. No evidence of acute conversation at this time but will have to monitor closely given the need for IV hydration due to AK I from severe dehydration. - Dictated nose, daily weights - Repeat echo  BPH: - continue Proscar  EMphysema: - duonebs as above - continue symbicort  Peripheral neuropathy: - continue neurontin   DVT prophylaxis: Hep  Code Status: FULL  Family Communication: wife  Disposition Plan: pendingi mprovement in respiratory status  Consults called: none  Admission status: observation    MERRELL, DAVID J MD [facility]  If 7PM-7AM, please contact night-coverage [email or web address] Password [facility]  03/17/2016, 4:24 PM

## 2016-03-17 NOTE — Significant Event (Addendum)
Rapid Response Event Note  Overview:Called by bedside RN re confusion and decreased sats Time Called: 1952 Arrival Time: 1955 Event Type: Neurologic, Respiratory  Initial Focused Assessment: Pt awake, confused, alert to self and place.  Skin hot and dry. Pt trying to get OOB and pull NRB mask off. Pt incontinent of urine.  Lungs coarse in bases. Pt c/o no pain however very confused.   Interventions: Pt placed on NRB mask PTA RRT d/t sats 70s on 6L [place]. CBG-101, ABG- 7.20, CO2-68.8, PO2-144, bicarb-26.2.  Trop-3.98 VS- T-102 rectally, HR-96, RR-40s, BP-135/112, sats-97% on NRB mask.  PCXR shows questionable PNA vs atelectasis vs chf. NP Schorr notified and Baltazar Najjar NP to bedside to assess patient. Pt given '4mg'$  zofran prior to initiation of bipap.  Pt appears more comfortable once bipap on. Pt moved to 2M12. Bedside handout given to [place], Therapist, sports. Plan of Care (if not transferred):  Event Summary: Name of Physician Notified: Schorr, NP Baltazar Najjar, NP to bedside at 2025) at 2015  Name of Consulting Physician Notified: Chase Caller at 2150 (notified by Baltazar Najjar, NP)  Outcome: Transferred (Comment) [phone])  Event End Time: 2120  Dillard [place]

## 2016-03-17 NOTE — ED Provider Notes (Signed)
[facility] DEPT Provider Note   CSN: [id number] Arrival date & time: 03/17/16  1241     History   Chief Complaint Chief Complaint  Patient presents with  . Cough  . Shortness of Breath    HPI Joseph Berg is a 83 y.o. male.  HPI  83 y.o. male with a hx of CAD CHF (EF 35-40%), HTN, HLD, presents to the Emergency Department today due to worsening shortness of breath. Symptoms began on 03-15-16. Seen by PCP yesterday and diagnosed with influenza and started on Tamiflu. Urged to go to emergency department due to new onset hypoxia at 84%, but left AMA. Noted fatigue, myalgias, nausea and 1 episode emesis. Pt states that he has not eaten in 3 days. No fevers at home. No CP/ABD pain. No other symptoms noted.    Past Medical History:  Diagnosis Date  . BPH (benign prostatic hyperplasia)   . CAD (coronary artery disease)    a. s/p CABG x1 with LIMA-LAD 1994. b. LM & 3v CAD by cath 07/2012, pending CABG.  . CHF (congestive heart failure) ([place])    a. EF 35-40% by echo 07/2012.  [name] Complication of anesthesia    affected patients memory. Pt stated "I couldn't remember anything for three months...just bits and pieces"  . Emphysema    a. Moderate emphysema by CT 06/2012.  [name] Heart attack 1994  . Hyperlipidemia   . Hypertension   . Hypoparathyroidism ([place])   . NSVT (nonsustained ventricular tachycardia) ([place])    a. Seen on tele 07/2012.  . OSA on CPAP    pt stated he does not wear CPAP because he no longer has sleep apnea  . Pneumonia   . Pseudoaneurysm of left femoral artery ([place])    a. post cath s/p compression 07/2012, associated w/ anemia.  . Sciatica 2012   Qualifier: Diagnosis of  By: Madilyn Fireman MD, [place]      Patient Active Problem List   Diagnosis Date Noted  . Postlaminectomy syndrome, lumbar region 12/14/2015  . COPD exacerbation ([place]) 03/27/2015  . HNP (herniated nucleus pulposus), lumbar 02/18/2015  . Claudication of right lower extremity ([place]) 12/09/2014  .  Adiposity 06/20/2014  . Lumbar degenerative disc disease 05/13/2014  . Mixed restrictive and obstructive lung disease ([place]) 01/16/2014  . Cerebrovascular disease 06/26/2013  . AAA (abdominal aortic aneurysm) without rupture ([place]) 06/24/2013  . Aortic calcification ([place]) 06/10/2013  . S/P CABG x 2 08/17/2012  . Near syncope 08/01/2012  . Cardiomyopathy, ischemic 07/25/2012  . Chronic systolic congestive heart failure ([place]) 07/11/2012  . CAD (coronary artery disease) of artery bypass graft 07/06/2012  . OSA (obstructive sleep apnea) 01/24/2012  . BPH (benign prostatic hyperplasia) 03/02/2011  . History of tobacco abuse 08/23/2010  . Hypoparathyroidism ([place]) 07/30/2010  . SCIATICA 11/10/2009  . INSULIN RESISTANCE SYNDROME 04/16/2007  . HYPERLIPIDEMIA NEC/NOS 11/14/2006  . HYPERTENSION, BENIGN ESSENTIAL 11/14/2006    Past Surgical History:  Procedure Laterality Date  . CATARACT EXTRACTION, BILATERAL    . COLONOSCOPY    . CORONARY ARTERY BYPASS GRAFT  03-23-92  . CORONARY ARTERY BYPASS GRAFT N/A 08/14/2012   Procedure: REDO CORONARY ARTERY BYPASS GRAFTING (CABG);  Surgeon: Gaye Pollack, MD;  Location: [facility];  Service: Open Heart Surgery;  Laterality: N/A;  . INTRAOPERATIVE TRANSESOPHAGEAL ECHOCARDIOGRAM N/A 08/14/2012   Procedure: INTRAOPERATIVE TRANSESOPHAGEAL ECHOCARDIOGRAM;  Surgeon: Gaye Pollack, MD;  Location: [facility];  Service: Open Heart Surgery;  Laterality: N/A;  . LUMBAR LAMINECTOMY/DECOMPRESSION MICRODISCECTOMY Left 02/18/2015  Procedure: LUMBAR LAMINECTOMY/DECOMPRESSION MICRODISCECTOMY- LEFT FOUR-FIVE ;  Surgeon: Ashok Pall, MD;  Location: [facility];  Service: Neurosurgery;  Laterality: Left;       Home Medications    Prior to Admission medications   Medication Sig Start Date End Date Taking? Authorizing Provider  acetaminophen (TYLENOL) 500 MG tablet Take 1-2 tablets (500-1,000 mg total) by mouth every 6 (six) hours as needed for pain. 08/03/12   Dayna N Dunn, PA-C    Ascorbic Acid (VITAMIN C) 1000 MG tablet Take 1,000 mg by mouth daily.    Historical Provider, MD  aspirin 81 MG tablet Take 81 mg by mouth daily.    Historical Provider, MD  carvedilol (COREG) 12.5 MG tablet TAKE 1 TABLET (12.5 MG TOTAL) BY MOUTH 2 (TWO) TIMES DAILY. 08/04/15   Lelon Perla, MD  cholecalciferol (VITAMIN D) 1000 UNITS tablet Take 3,000 Units by mouth daily.     Historical Provider, MD  Cyanocobalamin (VITAMIN B-12 PO) Take 1 tablet by mouth daily.    Historical Provider, MD  finasteride (PROSCAR) 5 MG tablet TAKE ONE TABLET BY MOUTH ONE TIME DAILY 02/29/16   [name], MD  furosemide (LASIX) 40 MG tablet TAKE ONE TABLET BY MOUTH ONE TIME DAILY 12/15/15   Lelon Perla, MD  gabapentin (NEURONTIN) 600 MG tablet Take 1 tablet (600 mg total) by mouth 3 (three) times daily. 01/11/16   Silverio Decamp, MD  ipratropium-albuterol (DUONEB) 0.5-2.5 (3) MG/3ML SOLN Take 3 mLs by nebulization every 2 (two) hours as needed (wheeze, SOB). 03/26/15   Emeterio Reeve, DO  losartan (COZAAR) 50 MG tablet TAKE ONE TABLET BY MOUTH ONE TIME DAILY 06/30/15   Lelon Perla, MD  Multiple Vitamin (MULTIVITAMIN) capsule Take 1 capsule by mouth daily.    Historical Provider, MD  Nebulizer MISC Generic nebulizer plus supplies 03/26/15   Emeterio Reeve, DO  ondansetron (ZOFRAN ODT) 4 MG disintegrating tablet Take one tab by mouth Q6hr prn nausea.  Dissolve under tongue. 03/16/16   Kandra Nicolas, MD  oseltamivir (TAMIFLU) 75 MG capsule Take 1 capsule (75 mg total) by mouth every 12 (twelve) hours. 03/16/16   Kandra Nicolas, MD  rosuvastatin (CRESTOR) 40 MG tablet TAKE 1 TABLET BY MOUTH EVERY DAY 08/26/15   Lelon Perla, MD  SYMBICORT 160-4.5 MCG/ACT inhaler INHALE TWO PUFFS BY MOUTH TWICE DAILY 09/09/15   [name], MD    Family History Family History  Problem Relation Age of Onset  . Heart attack Father 60  . Stroke Brother 28  . Hypertension Brother   .  Hyperlipidemia Brother     Social History Social History  Substance Use Topics  . Smoking status: Former Smoker    Packs/day: 1.00    Years: 60.00    Quit date: 10/08/2010  . Smokeless tobacco: Never Used  . Alcohol use Yes     Comment: Occasional     Allergies   Hctz [hydrochlorothiazide]   Review of Systems Review of Systems ROS reviewed and all are negative for acute change except as noted in the HPI.  Physical Exam Updated Vital Signs BP 99/56   Pulse 104   Temp 97.4 F (36.3 C) (Oral)   Resp 20   SpO2 92%   Physical Exam  Constitutional: He is oriented to person, place, and time. Vital signs are normal. He appears well-developed and well-nourished.  Ill appearing  HENT:  Head: Normocephalic and atraumatic.  Right Ear: Hearing normal.  Left Ear:  Hearing normal.  Eyes: Conjunctivae and EOM are normal. Pupils are equal, round, and reactive to light.  Neck: Normal range of motion. Neck supple.  Cardiovascular: Normal rate, regular rhythm, normal heart sounds and intact distal pulses.   Pulmonary/Chest: Effort normal. He has decreased breath sounds in the right upper field, the right lower field, the left upper field and the left lower field. He has no wheezes. He has no rhonchi. He has no rales.  Abdominal: Soft. There is no tenderness.  Musculoskeletal: Normal range of motion.  Neurological: He is alert and oriented to person, place, and time.  Skin: Skin is warm and dry.  Psychiatric: He has a normal mood and affect. His speech is normal and behavior is normal. Thought content normal.  Nursing note and vitals reviewed.  ED Treatments / Results  Labs (all labs ordered are listed, but only abnormal results are displayed) Labs Reviewed  CBC WITH DIFFERENTIAL/PLATELET - Abnormal; Notable for the following:       Result Value   Neutro Abs 8.4 (*)    Monocytes Absolute 1.3 (*)    All other components within normal limits  COMPREHENSIVE METABOLIC PANEL -  Abnormal; Notable for the following:    Glucose, Bld 118 (*)    BUN 44 (*)    Creatinine, Ser 3.24 (*)    AST 63 (*)    GFR calc non Af Amer 17 (*)    GFR calc Af Amer 19 (*)    All other components within normal limits  I-STAT CG4 LACTIC ACID, ED - Abnormal; Notable for the following:    Lactic Acid, Venous 2.04 (*)    All other components within normal limits    EKG  EKG Interpretation  Date/Time:  Thursday March 17 2016 12:57:04 EST Ventricular Rate:  81 PR Interval:  170 QRS Duration: 152 QT Interval:  436 QTC Calculation: 506 R Axis:   95 Text Interpretation:  Normal sinus rhythm Right bundle branch block Abnormal ECG similar to prior EKG  Confirmed by LIU MD, DANA [id number]) on 03/17/2016 1:47:35 PM       Radiology Dg Chest 2 View  Result Date: 03/17/2016 CLINICAL DATA:  Shortness of breath, nausea and vomiting cough for the past day. History of CAD, CHF an pneumonia. Former smoker. EXAM: CHEST  2 VIEW COMPARISON:  03/26/2015; 12/13/2013 FINDINGS: Grossly unchanged enlarged cardiac silhouette and mediastinal contours post median sternotomy and CABG. There is unchanged tortuosity and potential ectasia of the thoracic aorta. Atherosclerotic plaque within the thoracic aorta. The lungs remain hyperexpanded with flattening of the diaphragms. Veiling opacities overlying the bilateral lower lungs are unchanged in favored to represent overlying soft tissues. Unchanged pleuroparenchymal thickening about the bilateral major and the right minor fissures. No evidence of edema. No acute osseus abnormalities. Old/ healed fractures involving the posterolateral aspects of the left fourth and fifth ribs. Stigmata DISH within thoracic spine. IMPRESSION: 1. Similar findings of cardiomegaly, lung hyperexpansion and bronchitic change without superimposed acute cardiopulmonary disease. 2.  Aortic Atherosclerosis (ICD10-170.0) 3. Old left-sided rib fractures. Electronically Signed   By: Sandi Mariscal M.D.    On: 03/17/2016 13:52    Procedures Procedures (including critical care time)  Medications Ordered in ED Medications  albuterol (PROVENTIL) (2.5 MG/3ML) 0.083% nebulizer solution 5 mg (not administered)  sodium chloride 0.9 % bolus 1,000 mL (not administered)     Initial Impression / Assessment and Plan / ED Course  I have reviewed the triage vital signs and the nursing notes.  Pertinent labs & imaging results that were available during my care of the patient were reviewed by me and considered in my medical decision making (see chart for details).  Final Clinical Impressions(s) / ED Diagnoses  {I have reviewed and evaluated the relevant laboratory values. {I have reviewed and evaluated the relevant imaging studies. {I have interpreted the relevant EKG. {I have reviewed the relevant previous healthcare records.  {I obtained HPI from historian. {Patient discussed with supervising physician.  ED Course:  Assessment: Pt is a 82yM with hx CAD CHF (EF 35-40%), HTN, HLD who presents with worsening shortness of breath since yesterday. Seen by PCP and urged to go to ED due to new onset hypoxia 84%, but left AMA. Returns today due to symptoms not improving. On exam, pt in NAD. Nontoxic/nonseptic appearing. VS with tachycardia. Normotensive. O2 sat at 92% on RA. Placed on 2L [place] and given breathing Tx. Afebrile. Lungs with decrease lung sounds bilaterally.[name] Heart RRR. Abdomen nontender soft. CBC without leukocytosis. CMP with AKI noted with Cr 3.24. CXR unremarkable. Influenza obtained. Plan is to Admit to medicine.   Disposition/Plan:  Admit Pt acknowledges and agrees with plan  Supervising Physician Forde Dandy, MD  Final diagnoses:  Flu-like symptoms  AKI (acute kidney injury) [facility])    New Prescriptions New Prescriptions   No medications on file      Shary Decamp, PA-C 03/17/16 Renovo Liu, MD 03/17/16 1444

## 2016-03-17 NOTE — Consult Note (Signed)
PULMONARY / CRITICAL CARE MEDICINE   Name: Joseph Berg MRN: [id number] DOB: 29-Aug-1933    ADMISSION DATE:  03/17/2016 CONSULTATION DATE:  03/17/2016  REFERRING MD:  Marily Memos  CHIEF COMPLAINT:  Combativeness, shortness of breath  HISTORY OF PRESENT ILLNESS:   83 y/o male with a history of atrial fibrillation, systolic heart failure and COPD admitted on 2/8 with increasing dyspnea, cough.  He was found to have acute kidney injury, acute respiratory failure with hypoxemia and influenza A.  He was treated with tamiflu and oxygen.  In the evening of 2/8 he was treated with increasing doses of oxygen due to low O2 saturation.  Not long afterwards his mental status worsened and he became combative.  He was also noted to have some nausea.  An abg showed hypercarbic respiratory failure so he was started on BIPAP and admitted to the ICU.  In the ICU he has been breathing comfortably and has not been combative.  He says he is feeling better now that he has been wearing BIPAP.  PAST MEDICAL HISTORY :  He  has a past medical history of BPH (benign prostatic hyperplasia); CAD (coronary artery disease); CHF (congestive heart failure) ([place]); Complication of anesthesia; Emphysema; Heart attack (1994); Hyperlipidemia; Hypertension; Hypoparathyroidism ([place]); NSVT (nonsustained ventricular tachycardia) ([place]); OSA on CPAP; Pneumonia; Pseudoaneurysm of left femoral artery ([place]); and Sciatica (2012).  PAST SURGICAL HISTORY: He  has a past surgical history that includes Cataract extraction, bilateral; Intraoprative transesophageal echocardiogram (N/A, 08/14/2012); Coronary artery bypass graft (03-23-92); Coronary artery bypass graft (N/A, 08/14/2012); Colonoscopy; and Lumbar laminectomy/decompression microdiscectomy (Left, 02/18/2015).  Allergies  Allergen Reactions  . Hctz [Hydrochlorothiazide] Other (See Comments)    Affected renal function.     No current facility-administered medications on file prior to  encounter.    Current Outpatient Prescriptions on File Prior to Encounter  Medication Sig  . acetaminophen (TYLENOL) 500 MG tablet Take 1-2 tablets (500-1,000 mg total) by mouth every 6 (six) hours as needed for pain.  . Ascorbic Acid (VITAMIN C) 1000 MG tablet Take 1,000 mg by mouth daily.  [name] aspirin 81 MG tablet Take 81 mg by mouth daily.  . carvedilol (COREG) 12.5 MG tablet TAKE 1 TABLET (12.5 MG TOTAL) BY MOUTH 2 (TWO) TIMES DAILY.  . cholecalciferol (VITAMIN D) 1000 UNITS tablet Take 3,000 Units by mouth daily.   . Cyanocobalamin (VITAMIN B-12 PO) Take 1 tablet by mouth daily.  . finasteride (PROSCAR) 5 MG tablet TAKE ONE TABLET BY MOUTH ONE TIME DAILY  . furosemide (LASIX) 40 MG tablet TAKE ONE TABLET BY MOUTH ONE TIME DAILY  . ipratropium-albuterol (DUONEB) 0.5-2.5 (3) MG/3ML SOLN Take 3 mLs by nebulization every 2 (two) hours as needed (wheeze, SOB).  [name] losartan (COZAAR) 50 MG tablet TAKE ONE TABLET BY MOUTH ONE TIME DAILY  . Multiple Vitamin (MULTIVITAMIN) capsule Take 1 capsule by mouth daily.  . Nebulizer MISC Generic nebulizer plus supplies  . ondansetron (ZOFRAN ODT) 4 MG disintegrating tablet Take one tab by mouth Q6hr prn nausea.  Dissolve under tongue.  [name] oseltamivir (TAMIFLU) 75 MG capsule Take 1 capsule (75 mg total) by mouth every 12 (twelve) hours.  . rosuvastatin (CRESTOR) 40 MG tablet TAKE 1 TABLET BY MOUTH EVERY DAY  . SYMBICORT 160-4.5 MCG/ACT inhaler INHALE TWO PUFFS BY MOUTH TWICE DAILY  . gabapentin (NEURONTIN) 600 MG tablet Take 1 tablet (600 mg total) by mouth 3 (three) times daily. (Patient not taking: Reported on 03/17/2016)  . [DISCONTINUED] metoprolol (TOPROL-XL) 100 MG  24 hr tablet Take 100 mg by mouth 2 (two) times daily.      FAMILY HISTORY:  His indicated that his mother is deceased. He indicated that his father is deceased. He indicated that the status of his brother is unknown.    SOCIAL HISTORY: He  reports that he quit smoking about 5 years ago. He  has a 60.00 pack-year smoking history. He has never used smokeless tobacco. He reports that he drinks alcohol. He reports that he does not use drugs.  REVIEW OF SYSTEMS:   Gen: Denies fever, + chills, weight change, + fatigue, night sweats HEENT: Denies blurred vision, double vision, hearing loss, tinnitus, sinus congestion, rhinorrhea, sore throat, neck stiffness, dysphagia PULM: per HPI CV: Denies chest pain, edema, orthopnea, paroxysmal nocturnal dyspnea, palpitations GI: Denies abdominal pain, nausea, vomiting, diarrhea, hematochezia, melena, constipation, change in bowel habits GU: Denies dysuria, hematuria, polyuria, oliguria, urethral discharge Endocrine: Denies hot or cold intolerance, polyuria, polyphagia or appetite change Derm: Denies rash, dry skin, scaling or peeling skin change Heme: Denies easy bruising, bleeding, bleeding gums Neuro: Denies headache, numbness, weakness, slurred speech, loss of memory or consciousness   SUBJECTIVE:  As above  VITAL SIGNS: BP 124/66   Pulse 85   Temp 100.1 F (37.8 C) (Axillary)   Resp (!) 28   Ht '5\' 9"'$  (1.753 m)   Wt 211 lb 3.2 oz (95.8 kg)   SpO2 100%   BMI 31.19 kg/m   HEMODYNAMICS:    VENTILATOR SETTINGS: FiO2 (%):  [92 %] 92 %  INTAKE / OUTPUT: I/O last 3 completed shifts: In: 120 [P.O.:120] Out: -   PHYSICAL EXAMINATION: General:  Resting comfortably, on BIPAP Neuro:  Awake, alert, no distress, says he is in "the basement" of the hospital, but speech clear otherwise, moves all four extremities well HEENT:  NCAT, BIPAP mask in place Cardiovascular:  Irreg irreg, no gr Lungs:  Faint wheezes but good air movement Abdomen:  Soft, nontender, non-distended Musculoskeletal:  Normal bulk and tone Skin:  No rash or skin breakdown  LABS:  BMET  Recent Labs Lab 03/17/16 1315  NA 141  K 4.9  CL 102  CO2 24  BUN 44*  CREATININE 3.24*  GLUCOSE 118*    Electrolytes  Recent Labs Lab 03/17/16 1315  CALCIUM  9.3    CBC  Recent Labs Lab 03/17/16 1315 03/17/16 2138  WBC 10.5 10.3  HGB 15.3 14.1  HCT 47.4 44.5  PLT 116* 104*    Coag's No results for input(s): APTT, INR in the last 168 hours.  Sepsis Markers  Recent Labs Lab 03/17/16 1324 03/17/16 1834 03/17/16 2138  LATICACIDVEN 2.04*  --  1.4  PROCALCITON  --  0.56  --     ABG  Recent Labs Lab 03/17/16 1511 03/17/16 2021  PHART 7.312* 7.205*  PCO2ART 52.0* 68.8*  PO2ART 55.0* 144*    Liver Enzymes  Recent Labs Lab 03/17/16 1315  AST 63*  ALT 43  ALKPHOS 39  BILITOT 1.0  ALBUMIN 4.1    Cardiac Enzymes  Recent Labs Lab 03/17/16 1834  TROPONINI 3.98*    Glucose  Recent Labs Lab 03/17/16 1640 03/17/16 2011 03/17/16 2144  GLUCAP 88 101* 112*    Imaging Dg Chest 2 View  Result Date: 03/17/2016 CLINICAL DATA:  Shortness of breath, nausea and vomiting cough for the past day. History of CAD, CHF an pneumonia. Former smoker. EXAM: CHEST  2 VIEW COMPARISON:  03/26/2015; 12/13/2013 FINDINGS: Grossly unchanged enlarged cardiac  silhouette and mediastinal contours post median sternotomy and CABG. There is unchanged tortuosity and potential ectasia of the thoracic aorta. Atherosclerotic plaque within the thoracic aorta. The lungs remain hyperexpanded with flattening of the diaphragms. Veiling opacities overlying the bilateral lower lungs are unchanged in favored to represent overlying soft tissues. Unchanged pleuroparenchymal thickening about the bilateral major and the right minor fissures. No evidence of edema. No acute osseus abnormalities. Old/ healed fractures involving the posterolateral aspects of the left fourth and fifth ribs. Stigmata DISH within thoracic spine. IMPRESSION: 1. Similar findings of cardiomegaly, lung hyperexpansion and bronchitic change without superimposed acute cardiopulmonary disease. 2.  Aortic Atherosclerosis (ICD10-170.0) 3. Old left-sided rib fractures. Electronically Signed   By: Sandi Mariscal M.D.   On: 03/17/2016 13:52   Dg Chest Port 1 View  Result Date: 03/17/2016 CLINICAL DATA:  Acute respiratory distress EXAM: PORTABLE CHEST 1 VIEW COMPARISON:  1318 hours on the same day FINDINGS: The patient is status post CABG. There is aortic atherosclerosis without aneurysm. Patchy airspace opacities at each lung base suspicious for pneumonia and/or atelectasis. Mild vascular congestion is seen. Trace left effusion. No pneumothorax or acute osseous abnormality. Chronic left fifth rib fracture identified on current exam. IMPRESSION: Patchy airspace opacities at the lung bases are suspicious for pneumonia and/or atelectasis. Mild CHF with trace left effusion. Electronically Signed   By: Ashley Royalty M.D.   On: 03/17/2016 20:50   2/8 CXR images personally reviewed showing faint interstitial pneumonitis changes in the bases R>L   STUDIES:    CULTURES: 2/8 influenza swab> positive 2/8 urine strep antigen>  2/8 urine leg antigen>   ANTIBIOTICS: 2/8 ceftriaxone>  2/8 azithromycin >  2/8 tamiflu >   SIGNIFICANT EVENTS: 2/8 transfer to ICU  LINES/TUBES: 2/8 BIPAP  DISCUSSION: 83 y/o male with a significant history of coronary artery disease and systolic heart failure admitted with influenza A pneuonia causing acute respiratory failure with hypoxemia.  He was moved to the ICU this evening in the setting of hypercarbic respiratory failure, but in retrospect it looks more likely that he became hypercapneic in the setting of V/Q mismatch, haldane effect, and decreased respiratory drive rather than mechanical ventilatory failure.  While he may have a mild exacerbation of COPD, he is only faintly wheezing and responded quickly to BIPAP.  His encephalopathy was also likely due to hypercarbia as this improved with BIPAP.  Complicating matters, he has a NSTEMI which is certainly likely related to demand ischemia from his acute hypoxemia.  He also has acute kidney injury which is responding to  IVF.  ASSESSMENT / PLAN:  PULMONARY A: Acute respiratory failure with hypoxemia Acute hypercarbic respiratory failure Influenza A pneumonia Acute exacerbation of COPD P:   ABG now BIPAP prn > change to high flow if ABG OK Target O2 saturation is 88-92% Stop [employer] brovana, pulmicort Stop duoneb Start albuterol q6h scheduled Start albuterol prn wheezing Give low dose solumedrol '40mg'$  IV q12 overnight and re-assess need on 2/9 Maintain in ICU.  Given all of his medical problems he remains high risk for intubation  CARDIOVASCULAR A:  Elevated troponin> demand ischemia, AKI related Chronic systolic heart failure Afib P:  ASA 325 mg now Serial EKG Tele Heparin infusion Cardiology consult called Consider repeat CXR  RENAL A:   AKI> improving with IVF P:   Monitor BMET and UOP Replace electrolytes as needed Decrease dose of IVF to 50cc/hr NS  GASTROINTESTINAL A:   Nausea, resolved P:   NPO Prn  zofran  HEMATOLOGIC A:   Coagulopathy from heparin P:  Monitor for bleeding  INFECTIOUS A:   Influenza A pneumonia Community acquired pneumonia P:   Check urine legionella and strep antigen Increase ceftriaxone Continue azithromycin and tamiflu  ENDOCRINE A:   No acute issues P:   Monitor glucose  NEUROLOGIC A:   Acute encephalopathy due to hypercarbia> improved P:   No sedating medications Monitor closely in ICU   FAMILY  - Updates: wife updated bedside by me 2/8 PM  My cc time 45 minutes  Roselie Awkward, MD [place] [facility] Pager: [phone] Cell: [phone] After 3pm or if no response, call [phone]   03/17/2016, 10:37 PM

## 2016-03-17 NOTE — Progress Notes (Signed)
ABG results given to MD, currently pt is vomiting.  Per MD, no need for bipap currently.  Fio2 increased to 6 lpm [place].  RN aware.

## 2016-03-17 NOTE — Progress Notes (Signed)
    Just moved from 5N  Cam exam - on bipap , bp. HR, RR ok Able to talk Not paradoxicall  Bedside ccm to see  Dr. Brand Males, M.D., [facility].C.P Pulmonary and Critical Care Medicine Staff Physician [place] [facility] Pager: [phone], If no answer or between  15:00h - 7:00h: call [phone]  03/17/2016 9:47 PM

## 2016-03-17 NOTE — ED Notes (Signed)
Roselyn Reef, RN (nurse first) notified of abnormal lab test results

## 2016-03-18 ENCOUNTER — Observation Stay (HOSPITAL_BASED_OUTPATIENT_CLINIC_OR_DEPARTMENT_OTHER): Payer: Medicare PPO

## 2016-03-18 ENCOUNTER — Encounter (HOSPITAL_COMMUNITY): Payer: Self-pay | Admitting: Cardiology

## 2016-03-18 DIAGNOSIS — R0600 Dyspnea, unspecified: Secondary | ICD-10-CM

## 2016-03-18 DIAGNOSIS — G9340 Encephalopathy, unspecified: Secondary | ICD-10-CM | POA: Diagnosis present

## 2016-03-18 DIAGNOSIS — J1000 Influenza due to other identified influenza virus with unspecified type of pneumonia: Secondary | ICD-10-CM | POA: Diagnosis present

## 2016-03-18 DIAGNOSIS — N179 Acute kidney failure, unspecified: Secondary | ICD-10-CM | POA: Diagnosis present

## 2016-03-18 DIAGNOSIS — R748 Abnormal levels of other serum enzymes: Secondary | ICD-10-CM

## 2016-03-18 DIAGNOSIS — G629 Polyneuropathy, unspecified: Secondary | ICD-10-CM | POA: Diagnosis present

## 2016-03-18 DIAGNOSIS — R0602 Shortness of breath: Secondary | ICD-10-CM | POA: Diagnosis present

## 2016-03-18 DIAGNOSIS — I959 Hypotension, unspecified: Secondary | ICD-10-CM | POA: Diagnosis present

## 2016-03-18 DIAGNOSIS — I4891 Unspecified atrial fibrillation: Secondary | ICD-10-CM | POA: Diagnosis present

## 2016-03-18 DIAGNOSIS — R739 Hyperglycemia, unspecified: Secondary | ICD-10-CM | POA: Diagnosis present

## 2016-03-18 DIAGNOSIS — R6889 Other general symptoms and signs: Secondary | ICD-10-CM | POA: Diagnosis not present

## 2016-03-18 DIAGNOSIS — I21A1 Myocardial infarction type 2: Secondary | ICD-10-CM | POA: Diagnosis present

## 2016-03-18 DIAGNOSIS — I25810 Atherosclerosis of coronary artery bypass graft(s) without angina pectoris: Secondary | ICD-10-CM | POA: Diagnosis present

## 2016-03-18 DIAGNOSIS — I25700 Atherosclerosis of coronary artery bypass graft(s), unspecified, with unstable angina pectoris: Secondary | ICD-10-CM | POA: Diagnosis not present

## 2016-03-18 DIAGNOSIS — J9601 Acute respiratory failure with hypoxia: Secondary | ICD-10-CM | POA: Diagnosis present

## 2016-03-18 DIAGNOSIS — I248 Other forms of acute ischemic heart disease: Secondary | ICD-10-CM | POA: Diagnosis not present

## 2016-03-18 DIAGNOSIS — J9602 Acute respiratory failure with hypercapnia: Secondary | ICD-10-CM | POA: Diagnosis present

## 2016-03-18 DIAGNOSIS — I110 Hypertensive heart disease with heart failure: Secondary | ICD-10-CM | POA: Diagnosis present

## 2016-03-18 DIAGNOSIS — I5022 Chronic systolic (congestive) heart failure: Secondary | ICD-10-CM | POA: Diagnosis present

## 2016-03-18 DIAGNOSIS — E209 Hypoparathyroidism, unspecified: Secondary | ICD-10-CM | POA: Diagnosis present

## 2016-03-18 DIAGNOSIS — R0603 Acute respiratory distress: Secondary | ICD-10-CM

## 2016-03-18 DIAGNOSIS — R1310 Dysphagia, unspecified: Secondary | ICD-10-CM | POA: Diagnosis present

## 2016-03-18 DIAGNOSIS — J441 Chronic obstructive pulmonary disease with (acute) exacerbation: Secondary | ICD-10-CM | POA: Diagnosis present

## 2016-03-18 DIAGNOSIS — E785 Hyperlipidemia, unspecified: Secondary | ICD-10-CM | POA: Diagnosis present

## 2016-03-18 DIAGNOSIS — J101 Influenza due to other identified influenza virus with other respiratory manifestations: Secondary | ICD-10-CM | POA: Diagnosis present

## 2016-03-18 DIAGNOSIS — Z9842 Cataract extraction status, left eye: Secondary | ICD-10-CM | POA: Diagnosis not present

## 2016-03-18 DIAGNOSIS — N400 Enlarged prostate without lower urinary tract symptoms: Secondary | ICD-10-CM | POA: Diagnosis present

## 2016-03-18 DIAGNOSIS — I252 Old myocardial infarction: Secondary | ICD-10-CM | POA: Diagnosis not present

## 2016-03-18 DIAGNOSIS — J440 Chronic obstructive pulmonary disease with acute lower respiratory infection: Secondary | ICD-10-CM | POA: Diagnosis present

## 2016-03-18 DIAGNOSIS — G4733 Obstructive sleep apnea (adult) (pediatric): Secondary | ICD-10-CM | POA: Diagnosis present

## 2016-03-18 DIAGNOSIS — Z9841 Cataract extraction status, right eye: Secondary | ICD-10-CM | POA: Diagnosis not present

## 2016-03-18 LAB — RESPIRATORY PANEL BY PCR
Adenovirus: NOT DETECTED
BORDETELLA PERTUSSIS-RVPCR: NOT DETECTED
CORONAVIRUS OC43-RVPPCR: NOT DETECTED
Chlamydophila pneumoniae: NOT DETECTED
Coronavirus 229E: NOT DETECTED
Coronavirus HKU1: NOT DETECTED
Coronavirus NL63: NOT DETECTED
INFLUENZA A H3-RVPPCR: DETECTED — AB
INFLUENZA B-RVPPCR: NOT DETECTED
METAPNEUMOVIRUS-RVPPCR: NOT DETECTED
Mycoplasma pneumoniae: NOT DETECTED
PARAINFLUENZA VIRUS 2-RVPPCR: NOT DETECTED
PARAINFLUENZA VIRUS 3-RVPPCR: NOT DETECTED
PARAINFLUENZA VIRUS 4-RVPPCR: NOT DETECTED
Parainfluenza Virus 1: NOT DETECTED
RHINOVIRUS / ENTEROVIRUS - RVPPCR: NOT DETECTED
Respiratory Syncytial Virus: NOT DETECTED

## 2016-03-18 LAB — ECHOCARDIOGRAM COMPLETE
CHL CUP MV DEC (S): 257
EERAT: 9.57
EWDT: 257 ms
FS: 21 % — AB (ref 28–44)
Height: 69 in
IV/PV OW: 0.91
LA diam end sys: 37 mm
LA vol A4C: 61.6 ml
LA vol: 60.2 mL
LADIAMINDEX: 1.75 cm/m2
LASIZE: 37 mm
LAVOLIN: 28.5 mL/m2
LV TDI E'LATERAL: 10.4
LV TDI E'MEDIAL: 5.96
LV e' LATERAL: 10.4 cm/s
LVEEAVG: 9.57
LVEEMED: 9.57
LVOT SV: 87 mL
LVOT VTI: 20.9 cm
LVOT area: 4.15 cm2
LVOT peak vel: 95.4 cm/s
LVOTD: 23 mm
Lateral S' vel: 6.23 cm/s
MV Peak grad: 4 mmHg
MV pk A vel: 98.9 m/s
MVPKEVEL: 99.5 m/s
PW: 11.1 mm — AB (ref 0.6–1.1)
RV TAPSE: 15.3 mm
RV sys press: 26 mmHg
Reg peak vel: 240 cm/s
TRMAXVEL: 240 cm/s
Weight: 3379.21 oz

## 2016-03-18 LAB — CBC
HCT: 42.3 % (ref 39.0–52.0)
HEMOGLOBIN: 13.4 g/dL (ref 13.0–17.0)
MCH: 29.9 pg (ref 26.0–34.0)
MCHC: 31.7 g/dL (ref 30.0–36.0)
MCV: 94.4 fL (ref 78.0–100.0)
Platelets: 103 10*3/uL — ABNORMAL LOW (ref 150–400)
RBC: 4.48 MIL/uL (ref 4.22–5.81)
RDW: 14.4 % (ref 11.5–15.5)
WBC: 12.6 10*3/uL — AB (ref 4.0–10.5)

## 2016-03-18 LAB — STREP PNEUMONIAE URINARY ANTIGEN: STREP PNEUMO URINARY ANTIGEN: NEGATIVE

## 2016-03-18 LAB — GLUCOSE, CAPILLARY
GLUCOSE-CAPILLARY: 126 mg/dL — AB (ref 65–99)
GLUCOSE-CAPILLARY: 163 mg/dL — AB (ref 65–99)
GLUCOSE-CAPILLARY: 141 mg/dL — AB (ref 65–99)
Glucose-Capillary: 123 mg/dL — ABNORMAL HIGH (ref 65–99)
Glucose-Capillary: 114 mg/dL — ABNORMAL HIGH (ref 65–99)

## 2016-03-18 LAB — HEPARIN LEVEL (UNFRACTIONATED)
HEPARIN UNFRACTIONATED: 0.79 [IU]/mL — AB (ref 0.30–0.70)
Heparin Unfractionated: 0.99 IU/mL — ABNORMAL HIGH (ref 0.30–0.70)

## 2016-03-18 LAB — TROPONIN I
Troponin I: 5.37 ng/mL (ref <0.03)
Troponin I: 3.05 ng/mL (ref <0.03)

## 2016-03-18 LAB — HEMOGLOBIN A1C
HEMOGLOBIN A1C: 5.9 % — AB (ref 4.8–5.6)
Mean Plasma Glucose: 123 mg/dL

## 2016-03-18 LAB — LACTIC ACID, PLASMA: Lactic Acid, Venous: 1.3 mmol/L (ref 0.5–1.9)

## 2016-03-18 MED ORDER — SODIUM CHLORIDE 0.9 % IV SOLN
INTRAVENOUS | Status: DC
  Administered 2016-03-18: 12:00:00 via INTRAVENOUS

## 2016-03-18 MED ORDER — CARVEDILOL 12.5 MG PO TABS
12.5000 mg | ORAL_TABLET | Freq: Two times a day (BID) | ORAL | Status: DC
  Administered 2016-03-19 – 2016-03-21 (×5): 12.5 mg via ORAL
  Filled 2016-03-18 (×6): qty 1

## 2016-03-18 MED ORDER — IPRATROPIUM BROMIDE 0.02 % IN SOLN
RESPIRATORY_TRACT | Status: AC
  Administered 2016-03-18: 0.5 mg
  Filled 2016-03-18: qty 2.5

## 2016-03-18 MED ORDER — OSELTAMIVIR PHOSPHATE 30 MG PO CAPS
30.0000 mg | ORAL_CAPSULE | Freq: Every day | ORAL | Status: DC
  Administered 2016-03-18: 30 mg via ORAL
  Filled 2016-03-18: qty 1

## 2016-03-18 NOTE — Progress Notes (Signed)
Pt noted choking on coffee and lunch. Dr. Broadus John called and awaiting speech eval.

## 2016-03-18 NOTE — Progress Notes (Signed)
ANTICOAGULATION CONSULT NOTE - Follow Up Consult  Pharmacy Consult for Heparin Indication: chest pain/ACS  Allergies  Allergen Reactions  . Hctz [Hydrochlorothiazide] Other (See Comments)    Affected renal function.     Patient Measurements: Height: '5\' 9"'$  (175.3 cm) Weight: 211 lb 3.2 oz (95.8 kg) IBW/kg (Calculated) : 70.7 Heparin Dosing Weight: 90.6 kg  Vital Signs: Temp: 98.1 F (36.7 C) (02/09 1149) Temp Source: Oral (02/09 1149) BP: 109/60 (02/09 1400) Pulse Rate: 73 (02/09 1400)  Labs:  Recent Labs  03/17/16 1315  03/17/16 2138 03/18/16 0235 03/18/16 0530 03/18/16 0859 03/18/16 1358  HGB 15.3  --  14.1  --  13.4  --   --   HCT 47.4  --  44.5  --  42.3  --   --   PLT 116*  --  104*  --  103*  --   --   HEPARINUNFRC  --   --   --   --  0.79*  --  0.99*  CREATININE 3.24*  --  2.40*  --   --   --   --   TROPONINI  --   < > 4.92* 5.37*  --  3.05*  --   < > = values in this interval not displayed.  Estimated Creatinine Clearance: 28 mL/min (by C-G formula based on SCr of 2.4 mg/dL (H)).   Medications:  Infusions:  . sodium chloride 50 mL/hr at 03/18/16 1202  . heparin 1,100 Units/hr (03/18/16 0712)    Assessment: 83 year old male on IV heparin for chest pain/ACS.   Heparin level on repeat after small reduction to 1100 units/hr is SUPRA therapeutic at 0.99.  SCr is elevated at 2.40 but trending down.  Troponin is trending down.  CBC has decreased: Hgb 15.3 >>13.4, Plts 116 >>103.  No bleeding per RN.   Goal of Therapy:  Heparin level 0.3-0.7 units/ml Monitor platelets by anticoagulation protocol: Yes   Plan:  Decrease heparin to 900 units/hr.  Recheck heparin level in 8 hours.  Daily heparin level and CBC.   Sloan Leiter, PharmD, BCPS Clinical Pharmacist Clinical phone 03/18/2016 until 3:30 PM - [phone] After hours, please call [phone] 03/18/2016,2:49 PM

## 2016-03-18 NOTE — Progress Notes (Signed)
Pt has dark bloody urine from catheter and Dr. Broadus John made aware. Pt is on heparin gtt.

## 2016-03-18 NOTE — Progress Notes (Signed)
CRITICAL VALUE ALERT  Critical value received:  Troponin 5.37  Date of notification:  03/18/16  Time of notification:  0600  Critical value read back: yes  Nurse who received alert:  Arnell Sieving, RN   MD notified (1st page):  Dr. Jimmy Footman  Time of first page:  0605  MD notified (2nd page): N/A  Time of second page:N/A  Responding MD:  Dr. Jimmy Footman   Time MD responded: [phone]

## 2016-03-18 NOTE — Consult Note (Signed)
CARDIOLOGY CONSULT NOTE  Patient ID: Joseph Berg MRN: [id number] DOB/AGE: 81-Nov-1935 81 y.o.  Admit date: 03/17/2016 Primary Physician METHENEY,CATHERINE, MD Primary Cardiologist Dr. Stanford Breed Chief Complaint  SOB Requesting  Dr. Lake Bells  HPI:   The patient was admitted on 2/8 with dyspnea and cough.  He is found to have the flu (A).  He did have worsening hypoxemia this evening requiring treatment with BiPAP.  He also had acute confusion which improved with improvement of his dyspnea.  We are called because the troponin is elevated at 4.92.  He has a history of CAD with redo CABG as below. He has atrial fib and ischemic cardiomyopathy.  However, he cannot provide any details about his recent symptoms.  He is awake and denies any chest pain.  He thinks that his breathing is OK.  He has no family at the bedside and because of the lateness of the hour I will not call them to get the recent history.  His EKG demonstrates no acute ST T wave changes.    Past Medical History:  Diagnosis Date  . BPH (benign prostatic hyperplasia)   . CAD (coronary artery disease)    a. s/p CABG x1 with LIMA-LAD 1994. b. LM & 3v CAD by cath 07/2012  . CHF (congestive heart failure) ([place])    a. EF 35-40% by echo 07/2012.  [name] Complication of anesthesia    affected patients memory. Pt stated "I couldn't remember anything for three months...just bits and pieces"  . Emphysema    a. Moderate emphysema by CT 06/2012.  [name] Heart attack 1994  . Hyperlipidemia   . Hypertension   . Hypoparathyroidism ([place])   . NSVT (nonsustained ventricular tachycardia) ([place])    a. Seen on tele 07/2012.  . OSA on CPAP    pt stated he does not wear CPAP because he no longer has sleep apnea  . Pneumonia   . Pseudoaneurysm of left femoral artery ([place])    a. post cath s/p compression 07/2012, associated w/ anemia.  . Sciatica 2012   Qualifier: Diagnosis of  By: Madilyn Fireman MD, Barnetta Chapel      Past Surgical History:  Procedure  Laterality Date  . CATARACT EXTRACTION, BILATERAL    . COLONOSCOPY    . CORONARY ARTERY BYPASS GRAFT  03-23-92  . CORONARY ARTERY BYPASS GRAFT N/A 08/14/2012   Procedure: REDO CORONARY ARTERY BYPASS GRAFTING (CABG);  Surgeon: Gaye Pollack, MD;  Location: [facility];  Service: Open Heart Surgery;  Laterality: N/A;  . INTRAOPERATIVE TRANSESOPHAGEAL ECHOCARDIOGRAM N/A 08/14/2012   Procedure: INTRAOPERATIVE TRANSESOPHAGEAL ECHOCARDIOGRAM;  Surgeon: Gaye Pollack, MD;  Location: [facility];  Service: Open Heart Surgery;  Laterality: N/A;  . LUMBAR LAMINECTOMY/DECOMPRESSION MICRODISCECTOMY Left 02/18/2015   Procedure: LUMBAR LAMINECTOMY/DECOMPRESSION MICRODISCECTOMY- LEFT FOUR-FIVE ;  Surgeon: Ashok Pall, MD;  Location: [facility];  Service: Neurosurgery;  Laterality: Left;    Allergies  Allergen Reactions  . Hctz [Hydrochlorothiazide] Other (See Comments)    Affected renal function.    Prescriptions Prior to Admission  Medication Sig Dispense Refill Last Dose  . acetaminophen (TYLENOL) 500 MG tablet Take 1-2 tablets (500-1,000 mg total) by mouth every 6 (six) hours as needed for pain. 30 tablet 0 03/17/2016 at Unknown time  . Ascorbic Acid (VITAMIN C) 1000 MG tablet Take 1,000 mg by mouth daily.   03/17/2016 at Unknown time  . aspirin 81 MG tablet Take 81 mg by mouth daily.   03/17/2016 at Unknown time  . carvedilol (COREG)  12.5 MG tablet TAKE 1 TABLET (12.5 MG TOTAL) BY MOUTH 2 (TWO) TIMES DAILY. 180 tablet 3 03/17/2016 at 8a  . cholecalciferol (VITAMIN D) 1000 UNITS tablet Take 3,000 Units by mouth daily.    03/17/2016 at Unknown time  . Cyanocobalamin (VITAMIN B-12 PO) Take 1 tablet by mouth daily.   03/17/2016 at Unknown time  . finasteride (PROSCAR) 5 MG tablet TAKE ONE TABLET BY MOUTH ONE TIME DAILY 90 tablet 1 03/17/2016 at Unknown time  . furosemide (LASIX) 40 MG tablet TAKE ONE TABLET BY MOUTH ONE TIME DAILY 90 tablet 3 03/17/2016 at Unknown time  . ipratropium-albuterol (DUONEB) 0.5-2.5 (3) MG/3ML SOLN Take 3  mLs by nebulization every 2 (two) hours as needed (wheeze, SOB). 60 mL 3 03/16/2016 at Unknown time  . losartan (COZAAR) 50 MG tablet TAKE ONE TABLET BY MOUTH ONE TIME DAILY 30 tablet 10 03/17/2016 at Unknown time  . Multiple Vitamin (MULTIVITAMIN) capsule Take 1 capsule by mouth daily.   03/17/2016 at Unknown time  . Nebulizer MISC Generic nebulizer plus supplies 1 each 0 03/17/2016 at Unknown time  . ondansetron (ZOFRAN ODT) 4 MG disintegrating tablet Take one tab by mouth Q6hr prn nausea.  Dissolve under tongue. 12 tablet 0 03/16/2016 at Unknown time  . oseltamivir (TAMIFLU) 75 MG capsule Take 1 capsule (75 mg total) by mouth every 12 (twelve) hours. 10 capsule 0 03/16/2016 at Unknown time  . rosuvastatin (CRESTOR) 40 MG tablet TAKE 1 TABLET BY MOUTH EVERY DAY 30 tablet 11 03/16/2016 at Unknown time  . SYMBICORT 160-4.5 MCG/ACT inhaler INHALE TWO PUFFS BY MOUTH TWICE DAILY 10.2 Inhaler 6 03/17/2016 at Unknown time  . gabapentin (NEURONTIN) 600 MG tablet Take 1 tablet (600 mg total) by mouth 3 (three) times daily. (Patient not taking: Reported on 03/17/2016) 90 tablet 3 Not Taking at Unknown time   Family History  Problem Relation Age of Onset  . Heart attack Father 63  . Stroke Brother 23  . Hypertension Brother   . Hyperlipidemia Brother     Social History   Social History  . Marital status: Married    Spouse name: N/A  . Number of children: N/A  . Years of education: N/A   Occupational History  . Not on file.   Social History Main Topics  . Smoking status: Former Smoker    Packs/day: 1.00    Years: 60.00    Quit date: 10/08/2010  . Smokeless tobacco: Never Used  . Alcohol use Yes     Comment: Occasional  . Drug use: No  . 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 Not on file   Social History Narrative  . No narrative on file     ROS:  Unable to obtain from the patient  secondary to confusion.   As stated in the HPI and negative for all other systems.  Physical Exam: Blood pressure 125/63, pulse 81, temperature (!) 100.9 F (38.3 C), temperature source Axillary, resp. rate (!) 21, height '5\' 9"'$  (1.753 m), weight 211 lb 3.2 oz (95.8 kg), SpO2 100 %.  GENERAL:  In no distress.   HEENT:  Pupils equal round and reactive, fundi not visualized, oral mucosa unremarkable NECK:  No jugular venous distention, waveform within normal limits, carotid upstroke brisk and symmetric, no bruits, no thyromegaly LYMPHATICS:  No cervical, inguinal adenopathy LUNGS:  Decreased breath sounds with scattered  wheezing.  BACK:  No CVA tenderness CHEST:  Unremarkable HEART:  PMI not displaced or sustained,S1 and S2 within normal limits, no S3, no S4, no clicks, no rubs, no murmurs ABD:  Flat, positive bowel sounds normal in frequency in pitch, no bruits, no rebound, no guarding, no midline pulsatile mass, no hepatomegaly, no splenomegaly EXT:  2 plus pulses upper and decreased DP/PT bilateral lower, no edema, no cyanosis no clubbing SKIN:  No rashes no nodules NEURO:  Cranial nerves II through XII grossly intact, motor grossly intact throughout PSYCH:  Cognitively intact, oriented to person place and time   Labs: Lab Results  Component Value Date   BUN 48 (H) 03/17/2016   Lab Results  Component Value Date   CREATININE 2.40 (H) 03/17/2016   Lab Results  Component Value Date   NA 142 03/17/2016   K 4.3 03/17/2016   CL 103 03/17/2016   CO2 27 03/17/2016   Lab Results  Component Value Date   TROPONINI 4.92 ([place]) 03/17/2016   Lab Results  Component Value Date   WBC 10.3 03/17/2016   HGB 14.1 03/17/2016   HCT 44.5 03/17/2016   MCV 94.1 03/17/2016   PLT 104 (L) 03/17/2016   Lab Results  Component Value Date   CHOL 107 (L) 10/22/2015   HDL 39 (L) 10/22/2015   LDLCALC 36 10/22/2015   TRIG 158 (H) 10/22/2015   CHOLHDL 2.7 10/22/2015   Lab Results  Component Value  Date   ALT 43 03/17/2016   AST 63 (H) 03/17/2016   ALKPHOS 39 03/17/2016   BILITOT 1.0 03/17/2016      Radiology:   CXR: The patient is status post CABG. There is aortic atherosclerosis without aneurysm. Patchy airspace opacities at each lung base suspicious for pneumonia and/or atelectasis. Mild vascular congestion is seen. Trace left effusion. No pneumothorax or acute osseous abnormality. Chronic left fifth rib fracture identified on current exam.  EKG:NSR, rate 86, RBBB, diffuse nonspecific ST T wave changes.  03/18/2016  ASSESSMENT AND PLAN:   ELEVATED TROPONIN:    Probably related to demand ischemia.  At this point, he has no symptoms.  I would suggest an echocardiogram.  Continue ASA, heparin.  Invasive evaluation is not indicated secondary to lack of chest pain, acute EKG changes and acute renal insufficiency.  We will follow and consider further evaluation after he recovers from his acute respiratory illness.    RESPIRATORY FAILURE:  He has chronic lung disease complicated by the flu.  He is responding to therapy per the CCM team.     Signed: Minus Breeding 03/18/2016, 2:35 AM

## 2016-03-18 NOTE — Progress Notes (Signed)
Pt transferred via wheelchair to [id number]. Report called to [facility].

## 2016-03-18 NOTE — Progress Notes (Signed)
PHARMACY NOTE:  ANTIMICROBIAL RENAL DOSAGE ADJUSTMENT  Current antimicrobial regimen includes a mismatch between antimicrobial dosage and estimated renal function.  As per policy approved by the Pharmacy & Therapeutics and Medical Executive Committees, the antimicrobial dosage will be adjusted accordingly.  Current antimicrobial dosage:  Tamiflu 75 mg BID  Indication: Influenza  Renal Function:  Estimated Creatinine Clearance: 28 mL/min (by C-G formula based on SCr of 2.4 mg/dL (H)).  Antimicrobial dosage has been changed to:  Tamiflu 30 mg daily   Thank you for allowing pharmacy to be a part of this patient's care.  Dimitri Ped, PharmD, BCPS PGY-2 Infectious Diseases Pharmacy Resident Pager: [phone] 03/18/2016 8:31 AM

## 2016-03-18 NOTE — Progress Notes (Signed)
ANTICOAGULATION CONSULT NOTE - Follow Up Consult  Pharmacy Consult for heparin Indication: chest pain/ACS  Labs:  Recent Labs  03/17/16 1315 03/17/16 1834 03/17/16 2138 03/18/16 0235 03/18/16 0530  HGB 15.3  --  14.1  --   --   HCT 47.4  --  44.5  --   --   PLT 116*  --  104*  --   --   HEPARINUNFRC  --   --   --   --  0.79*  CREATININE 3.24*  --  2.40*  --   --   TROPONINI  --  3.98* 4.92* 5.37*  --     Assessment: 83yo male above goal on heparin with initial dosing for elevated troponin though could still be affected by bolus given age and renal function.  Goal of Therapy:  Heparin level 0.3-0.7 units/ml   Plan:  Will decrease heparin gtt slightly to 1100 units/hr and check level in [place], PharmD, BCPS  03/18/2016,6:05 AM

## 2016-03-18 NOTE — Care Management Note (Signed)
Case Management Note  Patient Details  Name: Joseph Berg MRN: [id number] Date of Birth: 1933-11-10  Subjective/Objective:        Pt admitted with SOB            Action/Plan:   PTA pt was independent from home with wife.  Pt was on home oxygen in 2014 prior to heart surgery.  Pt may need home oxygen at home - CM will continue to follow for discharge needs   Expected Discharge Date:                  Expected Discharge Plan:  Home/Self Care  In-House Referral:     Discharge planning Services  CM Consult  Post Acute Care Choice:    Choice offered to:     DME Arranged:    DME Agency:     HH Arranged:    HH Agency:     Status of Service:  In process, will continue to follow  If discussed at Long Length of Stay Meetings, dates discussed:    Additional Comments:  Maryclare Labrador, RN 03/18/2016, 11:15 AM

## 2016-03-18 NOTE — Progress Notes (Signed)
PROGRESS NOTE    Joseph Berg  KGM:[id number] DOB: 10-25-1933 DOA: 03/17/2016 PCP: Beatrice Lecher, MD  Brief Narrative:Joseph Berg is a 83 y.o. male with medical history significant of BPH, CAD status post CABG, CHF with an EF of 35%, hyperlipidemia, hypertension, hypoparathyroidism, sciatica presenting w/ progressive dry cough and shortness of breath. Started on 03/15/2016. Seen by primary care physician one day ago and diagnosed with influenza and started on Tamiflu. At that time patient was counseled to go to the emergency room due to hypoxemia with O2 saturations of 84%  Assessment & Plan:  Acute Respiratory Failure: -due to FLu, COPD exacerbation, NSTEMI -continue Tamiflu, ceftriaxone, IV solumedrol, nebs -Off BIPAP now  NSTEMI vs Demand ischemia -h/o CAD s/p CABG -Troponin upto 5.3 -Cards following, on IV heparin -continue ASA, resume coreg -FU ECHO  COPD exacerbation due to FLu -as above  Acute kidney injury: Creatinine 3.24. Baseline 0.9.  -now down to 2.4 -continue gentle IVF at 50cc/hr  Hypotension: Likely secondary to continuation of home blood pressure medications and diuretics in the setting of little to no fluid intake for 3 days. - Hold coreg, Lasix, cozaar  Hyperglycemia: -no h/o DM - CBG AC HS, FU UYQ0H  Chronic systolic congestive heart failure: Last echo from 2015 showing EF of 47% and mild diastolic dysfunction.  -on gentle  IV hydration due to AK I from severe dehydration. -repeat ECHO  BPH: - continue Proscar  Peripheral neuropathy: - continue neurontin  DVT prophylaxis: IV Hep  Code Status: FULL  Family Communication: wife  Disposition Plan: pendingi mprovement in respiratory status , TX to tele    Consultants:   PCCM/cards   Antimicrobials:    Subjective: Breathing better  Objective: Vitals:   03/18/16 0840 03/18/16 0844 03/18/16 0900 03/18/16 1000  BP:   (!) 128/110 116/70  Pulse:   66 74  Resp:   20  19  Temp:  99 F (37.2 C)    TempSrc:  Oral    SpO2: 100%  97% 100%  Weight:      Height:        Intake/Output Summary (Last 24 hours) at 03/18/16 1128 Last data filed at 03/18/16 1000  Gross per 24 hour  Intake          1055.97 ml  Output              610 ml  Net           445.97 ml   Filed Weights   03/17/16 2202 03/18/16 0500  Weight: 95.8 kg (211 lb 3.2 oz) 95.8 kg (211 lb 3.2 oz)    Examination:  General exam: AAOx3 Respiratory system: few basilar ronchi Cardiovascular system: S1 & S2 heard, RRR. No JVD, murmurs Gastrointestinal system: Abdomen is nondistended, soft and nontender. No organomegaly or masses felt. Normal bowel sounds heard. Central nervous system: Alert and oriented. No focal neurological deficits. Extremities: Symmetric 5 x 5 power. Skin: No rashes, lesions or ulcers Psychiatry: Judgement and insight appear normal. Mood & affect appropriate.     Data Reviewed: I have personally reviewed following labs and imaging studies  CBC:  Recent Labs Lab 03/17/16 1315 03/17/16 2138 03/18/16 0530  WBC 10.5 10.3 12.6*  NEUTROABS 8.4*  --   --   HGB 15.3 14.1 13.4  HCT 47.4 44.5 42.3  MCV 94.0 94.1 94.4  PLT 116* 104* 425*   Basic Metabolic Panel:  Recent Labs Lab 03/17/16 1315 03/17/16 2138  NA 141 142  K 4.9 4.3  CL 102 103  CO2 24 27  GLUCOSE 118* 115*  BUN 44* 48*  CREATININE 3.24* 2.40*  CALCIUM 9.3 8.7*   GFR: Estimated Creatinine Clearance: 28 mL/min (by C-G formula based on SCr of 2.4 mg/dL (H)). Liver Function Tests:  Recent Labs Lab 03/17/16 1315  AST 63*  ALT 43  ALKPHOS 39  BILITOT 1.0  PROT 6.5  ALBUMIN 4.1   No results for input(s): LIPASE, AMYLASE in the last 168 hours. No results for input(s): AMMONIA in the last 168 hours. Coagulation Profile: No results for input(s): INR, PROTIME in the last 168 hours. Cardiac Enzymes:  Recent Labs Lab 03/17/16 1834 03/17/16 2138 03/18/16 0235 03/18/16 0859  TROPONINI  3.98* 4.92* 5.37* 3.05*   BNP (last 3 results) No results for input(s): PROBNP in the last 8760 hours. HbA1C:  Recent Labs  03/17/16 1630  HGBA1C 5.9*   CBG:  Recent Labs Lab 03/17/16 2011 03/17/16 2144 03/17/16 2315 03/18/16 0315 03/18/16 0846  GLUCAP 101* 112* 110* 126* 123*   Lipid Profile: No results for input(s): CHOL, HDL, LDLCALC, TRIG, CHOLHDL, LDLDIRECT in the last 72 hours. Thyroid Function Tests: No results for input(s): TSH, T4TOTAL, FREET4, T3FREE, THYROIDAB in the last 72 hours. Anemia Panel: No results for input(s): VITAMINB12, FOLATE, FERRITIN, TIBC, IRON, RETICCTPCT in the last 72 hours. Urine analysis:    Component Value Date/Time   COLORURINE YELLOW 03/17/2016 2203   APPEARANCEUR HAZY (A) 03/17/2016 2203   LABSPEC 1.018 03/17/2016 2203   PHURINE 5.0 03/17/2016 2203   GLUCOSEU NEGATIVE 03/17/2016 2203   HGBUR SMALL (A) 03/17/2016 2203   BILIRUBINUR NEGATIVE 03/17/2016 2203   BILIRUBINUR neg 10/31/2011 1121   KETONESUR NEGATIVE 03/17/2016 2203   PROTEINUR 30 (A) 03/17/2016 2203   UROBILINOGEN 2.0 (H) 08/09/2012 1316   NITRITE NEGATIVE 03/17/2016 2203   LEUKOCYTESUR NEGATIVE 03/17/2016 2203   Sepsis Labs: '@LABRCNTIP'$ (procalcitonin:4,lacticidven:4)  ) Recent Results (from the past 240 hour(s))  MRSA PCR Screening     Status: None   Collection Time: 03/17/16  9:31 PM  Result Value Ref Range Status   MRSA by PCR NEGATIVE NEGATIVE Final    Comment:        The GeneXpert MRSA Assay (FDA approved for NASAL specimens only), is one component of a comprehensive MRSA colonization surveillance program. It is not intended to diagnose MRSA infection nor to guide or monitor treatment for MRSA infections.          Radiology Studies: Dg Chest 2 View  Result Date: 03/17/2016 CLINICAL DATA:  Shortness of breath, nausea and vomiting cough for the past day. History of CAD, CHF an pneumonia. Former smoker. EXAM: CHEST  2 VIEW COMPARISON:  03/26/2015;  12/13/2013 FINDINGS: Grossly unchanged enlarged cardiac silhouette and mediastinal contours post median sternotomy and CABG. There is unchanged tortuosity and potential ectasia of the thoracic aorta. Atherosclerotic plaque within the thoracic aorta. The lungs remain hyperexpanded with flattening of the diaphragms. Veiling opacities overlying the bilateral lower lungs are unchanged in favored to represent overlying soft tissues. Unchanged pleuroparenchymal thickening about the bilateral major and the right minor fissures. No evidence of edema. No acute osseus abnormalities. Old/ healed fractures involving the posterolateral aspects of the left fourth and fifth ribs. Stigmata DISH within thoracic spine. IMPRESSION: 1. Similar findings of cardiomegaly, lung hyperexpansion and bronchitic change without superimposed acute cardiopulmonary disease. 2.  Aortic Atherosclerosis (ICD10-170.0) 3. Old left-sided rib fractures. Electronically Signed   By: Sandi Mariscal M.D.   On:  03/17/2016 13:52   Dg Chest Port 1 View  Result Date: 03/17/2016 CLINICAL DATA:  Acute respiratory distress EXAM: PORTABLE CHEST 1 VIEW COMPARISON:  1318 hours on the same day FINDINGS: The patient is status post CABG. There is aortic atherosclerosis without aneurysm. Patchy airspace opacities at each lung base suspicious for pneumonia and/or atelectasis. Mild vascular congestion is seen. Trace left effusion. No pneumothorax or acute osseous abnormality. Chronic left fifth rib fracture identified on current exam. IMPRESSION: Patchy airspace opacities at the lung bases are suspicious for pneumonia and/or atelectasis. Mild CHF with trace left effusion. Electronically Signed   By: Ashley Royalty M.D.   On: 03/17/2016 20:50        Scheduled Meds: . albuterol  2.5 mg Nebulization Q6H  . arformoterol  15 mcg Nebulization BID  . aspirin EC  325 mg Oral Daily  . budesonide (PULMICORT) nebulizer solution  0.5 mg Nebulization BID  . cefTRIAXone  (ROCEPHIN)  IV  2 g Intravenous Q24H  . finasteride  5 mg Oral Daily  . methylPREDNISolone (SOLU-MEDROL) injection  40 mg Intravenous BID  . oseltamivir  30 mg Oral Daily  . rosuvastatin  40 mg Oral Daily  . sodium chloride flush  3 mL Intravenous Q12H   Continuous Infusions: . sodium chloride    . heparin 1,100 Units/hr (03/18/16 0605)     LOS: 0 days    Time spent: 68mn    PDomenic Polite MD [facility] Pager [phone] If 7PM-7AM, please contact night-coverage [email or web address] Password [email or web address] 03/18/2016, 11:28 AM

## 2016-03-18 NOTE — Progress Notes (Signed)
  Echocardiogram 2D Echocardiogram has been performed.  Joseph Berg 03/18/2016, 3:03 PM

## 2016-03-18 NOTE — Progress Notes (Signed)
PULMONARY / CRITICAL CARE MEDICINE   Name: Joseph Berg MRN: [id number] DOB: 29-Apr-1933    LOS: 1 day  HISTORY OF PRESENT ILLNESS:   83 y/o male with a history of atrial fibrillation, systolic heart failure and COPD admitted on 2/8 with increasing dyspnea, cough.  He was found to have acute kidney injury, acute respiratory failure with hypoxemia and influenza A.  He was treated with tamiflu and oxygen.  In the evening of 2/8 he was treated with increasing doses of oxygen due to low O2 saturation.  Not long afterwards his mental status worsened and he became combative.  He was also noted to have some nausea.  An abg showed hypercarbic respiratory failure so he was started on BIPAP and admitted to the ICU.  In the ICU his respiratory status approved on BiPAP.   PAST MEDICAL HISTORY :  He  has a past medical history of BPH (benign prostatic hyperplasia); CAD (coronary artery disease); CHF (congestive heart failure) ([place]); Complication of anesthesia; Emphysema; Heart attack (1994); Hyperlipidemia; Hypertension; Hypoparathyroidism ([place]); NSVT (nonsustained ventricular tachycardia) ([place]); OSA on CPAP; Pneumonia; Pseudoaneurysm of left femoral artery ([place]); and Sciatica (2012).  PAST SURGICAL HISTORY: He  has a past surgical history that includes Cataract extraction, bilateral; Intraoprative transesophageal echocardiogram (N/A, 08/14/2012); Coronary artery bypass graft (03-23-92); Coronary artery bypass graft (N/A, 08/14/2012); Colonoscopy; and Lumbar laminectomy/decompression microdiscectomy (Left, 02/18/2015).  Allergies  Allergen Reactions  . Hctz [Hydrochlorothiazide] Other (See Comments)    Affected renal function.     No current facility-administered medications on file prior to encounter.    Current Outpatient Prescriptions on File Prior to Encounter  Medication Sig  . acetaminophen (TYLENOL) 500 MG tablet Take 1-2 tablets (500-1,000 mg total) by mouth every 6 (six) hours as needed for  pain.  . Ascorbic Acid (VITAMIN C) 1000 MG tablet Take 1,000 mg by mouth daily.  [name] aspirin 81 MG tablet Take 81 mg by mouth daily.  . carvedilol (COREG) 12.5 MG tablet TAKE 1 TABLET (12.5 MG TOTAL) BY MOUTH 2 (TWO) TIMES DAILY.  . cholecalciferol (VITAMIN D) 1000 UNITS tablet Take 3,000 Units by mouth daily.   . Cyanocobalamin (VITAMIN B-12 PO) Take 1 tablet by mouth daily.  . finasteride (PROSCAR) 5 MG tablet TAKE ONE TABLET BY MOUTH ONE TIME DAILY  . furosemide (LASIX) 40 MG tablet TAKE ONE TABLET BY MOUTH ONE TIME DAILY  . ipratropium-albuterol (DUONEB) 0.5-2.5 (3) MG/3ML SOLN Take 3 mLs by nebulization every 2 (two) hours as needed (wheeze, SOB).  [name] losartan (COZAAR) 50 MG tablet TAKE ONE TABLET BY MOUTH ONE TIME DAILY  . Multiple Vitamin (MULTIVITAMIN) capsule Take 1 capsule by mouth daily.  . Nebulizer MISC Generic nebulizer plus supplies  . ondansetron (ZOFRAN ODT) 4 MG disintegrating tablet Take one tab by mouth Q6hr prn nausea.  Dissolve under tongue.  [name] oseltamivir (TAMIFLU) 75 MG capsule Take 1 capsule (75 mg total) by mouth every 12 (twelve) hours.  . rosuvastatin (CRESTOR) 40 MG tablet TAKE 1 TABLET BY MOUTH EVERY DAY  . SYMBICORT 160-4.5 MCG/ACT inhaler INHALE TWO PUFFS BY MOUTH TWICE DAILY  . gabapentin (NEURONTIN) 600 MG tablet Take 1 tablet (600 mg total) by mouth 3 (three) times daily. (Patient not taking: Reported on 03/17/2016)  . [DISCONTINUED] metoprolol (TOPROL-XL) 100 MG 24 hr tablet Take 100 mg by mouth 2 (two) times daily.      FAMILY HISTORY:  His indicated that his mother is deceased. He indicated that his father is deceased. He  indicated that the status of his brother is unknown.    SOCIAL HISTORY: He  reports that he quit smoking about 5 years ago. He has a 60.00 pack-year smoking history. He has never used smokeless tobacco. He reports that he drinks alcohol. He reports that he does not use drugs.  SUBJECTIVE:  Doing well on BiPAP.   VITAL SIGNS: BP 110/61    Pulse 66   Temp 99.3 F (37.4 C) (Axillary)   Resp 20   Ht '5\' 9"'$  (1.753 m)   Wt 211 lb 3.2 oz (95.8 kg)   SpO2 100%   BMI 31.19 kg/m     VENTILATOR SETTINGS: FiO2 (%):  [92 %] 92 %  INTAKE / OUTPUT: I/O last 3 completed shifts: In: 858.9 [P.O.:120; I.V.:488.9; IV Piggyback:250] Out: 310 [Urine:310]  PHYSICAL EXAMINATION: General:  Sleeping comfortably, on BIPAP Neuro:  Alert and oriented, sleeping but easily able to arouse HEENT:  NCAT, BIPAP mask in place Cardiovascular:  Irreg irreg, no gr Lungs:  Faint wheezes bilaterally Abdomen:  Soft, nontender, non-distended, +BS Skin:  No rash or skin breakdown  LABS:  BMET  Recent Labs Lab 03/17/16 1315 03/17/16 2138  NA 141 142  K 4.9 4.3  CL 102 103  CO2 24 27  BUN 44* 48*  CREATININE 3.24* 2.40*  GLUCOSE 118* 115*    Electrolytes  Recent Labs Lab 03/17/16 1315 03/17/16 2138  CALCIUM 9.3 8.7*    CBC  Recent Labs Lab 03/17/16 1315 03/17/16 2138 03/18/16 0530  WBC 10.5 10.3 12.6*  HGB 15.3 14.1 13.4  HCT 47.4 44.5 42.3  PLT 116* 104* 103*    Coag's No results for input(s): APTT, INR in the last 168 hours.  Sepsis Markers  Recent Labs Lab 03/17/16 1324 03/17/16 1834 03/17/16 2138 03/18/16 0042  LATICACIDVEN 2.04*  --  1.4 1.3  PROCALCITON  --  0.56  --   --     ABG  Recent Labs Lab 03/17/16 1511 03/17/16 2021 03/17/16 2300  PHART 7.312* 7.205* 7.254*  PCO2ART 52.0* 68.8* 59.9*  PO2ART 55.0* 144* 63.9*    Liver Enzymes  Recent Labs Lab 03/17/16 1315  AST 63*  ALT 43  ALKPHOS 39  BILITOT 1.0  ALBUMIN 4.1    Cardiac Enzymes  Recent Labs Lab 03/17/16 1834 03/17/16 2138 03/18/16 0235  TROPONINI 3.98* 4.92* 5.37*    Glucose  Recent Labs Lab 03/17/16 1640 03/17/16 2011 03/17/16 2144 03/17/16 2315 03/18/16 0315  GLUCAP 88 101* 112* 110* 126*    Imaging Dg Chest 2 View  Result Date: 03/17/2016 CLINICAL DATA:  Shortness of breath, nausea and vomiting  cough for the past day. History of CAD, CHF an pneumonia. Former smoker. EXAM: CHEST  2 VIEW COMPARISON:  03/26/2015; 12/13/2013 FINDINGS: Grossly unchanged enlarged cardiac silhouette and mediastinal contours post median sternotomy and CABG. There is unchanged tortuosity and potential ectasia of the thoracic aorta. Atherosclerotic plaque within the thoracic aorta. The lungs remain hyperexpanded with flattening of the diaphragms. Veiling opacities overlying the bilateral lower lungs are unchanged in favored to represent overlying soft tissues. Unchanged pleuroparenchymal thickening about the bilateral major and the right minor fissures. No evidence of edema. No acute osseus abnormalities. Old/ healed fractures involving the posterolateral aspects of the left fourth and fifth ribs. Stigmata DISH within thoracic spine. IMPRESSION: 1. Similar findings of cardiomegaly, lung hyperexpansion and bronchitic change without superimposed acute cardiopulmonary disease. 2.  Aortic Atherosclerosis (ICD10-170.0) 3. Old left-sided rib fractures. Electronically Signed   By: Jenny Reichmann  Watts M.D.   On: 03/17/2016 13:52   Dg Chest Port 1 View  Result Date: 03/17/2016 CLINICAL DATA:  Acute respiratory distress EXAM: PORTABLE CHEST 1 VIEW COMPARISON:  1318 hours on the same day FINDINGS: The patient is status post CABG. There is aortic atherosclerosis without aneurysm. Patchy airspace opacities at each lung base suspicious for pneumonia and/or atelectasis. Mild vascular congestion is seen. Trace left effusion. No pneumothorax or acute osseous abnormality. Chronic left fifth rib fracture identified on current exam. IMPRESSION: Patchy airspace opacities at the lung bases are suspicious for pneumonia and/or atelectasis. Mild CHF with trace left effusion. Electronically Signed   By: Ashley Royalty M.D.   On: 03/17/2016 20:50    STUDIES:  2/8 CXR images personally reviewed showing faint interstitial pneumonitis changes in the bases  R>L  CULTURES: 2/8 influenza swab> positive 2/8 urine strep antigen> negative  2/8 urine leg antigen>   ANTIBIOTICS: 2/8 ceftriaxone>  2/8 azithromycin >>>2/9 2/8 tamiflu >   SIGNIFICANT EVENTS: 2/8 transfer to ICU  LINES/TUBES: 2/8 BIPAP  DISCUSSION: 83 y/o male with a significant history of coronary artery disease and systolic heart failure admitted with influenza A pneuonia causing acute respiratory failure with hypoxemia.  He was moved to the ICU on 2/8 in the setting of hypercarbic respiratory failure, but in retrospect it looks more likely that he became hypercapneic in the setting of V/Q mismatch, haldane effect, and decreased respiratory drive rather than mechanical ventilatory failure.  While he may have a mild exacerbation of COPD, he is only faintly wheezing and responded quickly to BIPAP.  His encephalopathy was also likely due to hypercarbia as this improved with BIPAP.  Complicating matters, he has a NSTEMI which is certainly likely related to demand ischemia from his acute hypoxemia.  He also has acute kidney injury which is responding to IVF.  ASSESSMENT / PLAN:  PULMONARY A: Acute respiratory failure with hypoxemia Acute hypercarbic respiratory failure Influenza A pneumonia Acute exacerbation of COPD P:   BIPAP prn  Target O2 saturation is 88-92% Cont brovana, pulmicort, albuterol q6h scheduled, albuterol prn wheezing Received low dose solumedrol '40mg'$  IV q12 overnight. Consider continuing.    CARDIOVASCULAR A:  NSTEMI> likely 2/2 demand ischemia from acute hypoxemia Chronic systolic heart failure Afib P:  Trop continuing to rise - most recent 5.3 ASA 325 mg now Tele Heparin infusion Cardiology following - appreciate recommendations Consider echo per cards recs   RENAL A:   AKI> improving with IVF P:   Monitor BMET and UOP Replace electrolytes as needed Continue 50cc/hr NS  GASTROINTESTINAL A:   Nausea, resolved P:   NPO Prn  zofran  HEMATOLOGIC A:   Coagulopathy from heparin P:  Monitor for bleeding  INFECTIOUS A:   Influenza A pneumonia Community acquired pneumonia P:   Urine legionella pending Continue CTX, azithromycin and tamiflu  ENDOCRINE A:   No acute issues P:   Monitor glucose  NEUROLOGIC A:   Acute encephalopathy due to hypercarbia> improved P:   No sedating medications   Adin Hector, MD, MPH PGY-2 [facility] Pager [phone]  STAFF NOTE: Linwood Dibbles, MD FACP have personally reviewed patient's available data, including medical history, events of note, physical examination and test results as part of my evaluation. I have discussed with resident/NP and other care providers such as pharmacist, RN and RRT. In addition, I personally evaluated patient and elicited key findings of: awake, alert, no further confused, lungs coarse, pcxr int bibasilar changes, with  pos balance improved resp status, Nonstemi in setting flu , r/o super infectionpna, maintain ceftriaxone, dc azithro unlikely atypical on top, pct is greater .5, maintain abx, keep tamiflu dose adjusted renal failure which is resolving, consider 5 days abx, dc NIMV as scheduled ,clinical status good, no repeat abg required, to tele okay, cards getting echo, heparin, asa Will sign off  Lavon Paganini. Titus Mould, MD, [place] Pgr: [facility] 03/18/2016 11:06 AM

## 2016-03-19 DIAGNOSIS — I25700 Atherosclerosis of coronary artery bypass graft(s), unspecified, with unstable angina pectoris: Secondary | ICD-10-CM

## 2016-03-19 LAB — CBC
HCT: 40.5 % (ref 39.0–52.0)
HEMOGLOBIN: 12.9 g/dL — AB (ref 13.0–17.0)
MCH: 29.9 pg (ref 26.0–34.0)
MCHC: 31.9 g/dL (ref 30.0–36.0)
MCV: 93.8 fL (ref 78.0–100.0)
Platelets: 133 10*3/uL — ABNORMAL LOW (ref 150–400)
RBC: 4.32 MIL/uL (ref 4.22–5.81)
RDW: 14.1 % (ref 11.5–15.5)
WBC: 13.8 10*3/uL — ABNORMAL HIGH (ref 4.0–10.5)

## 2016-03-19 LAB — BASIC METABOLIC PANEL
Anion gap: 10 (ref 5–15)
BUN: 70 mg/dL — ABNORMAL HIGH (ref 6–20)
CHLORIDE: 104 mmol/L (ref 101–111)
CO2: 26 mmol/L (ref 22–32)
Calcium: 8.5 mg/dL — ABNORMAL LOW (ref 8.9–10.3)
Creatinine, Ser: 1.35 mg/dL — ABNORMAL HIGH (ref 0.61–1.24)
GFR calc Af Amer: 56 mL/min — ABNORMAL LOW (ref >60)
GFR calc non Af Amer: 48 mL/min — ABNORMAL LOW (ref >60)
Glucose, Bld: 127 mg/dL — ABNORMAL HIGH (ref 65–99)
POTASSIUM: 4.7 mmol/L (ref 3.5–5.1)
SODIUM: 140 mmol/L (ref 135–145)

## 2016-03-19 LAB — LEGIONELLA PNEUMOPHILA SEROGP 1 UR AG: L. PNEUMOPHILA SEROGP 1 UR AG: NEGATIVE

## 2016-03-19 LAB — GLUCOSE, CAPILLARY
GLUCOSE-CAPILLARY: 137 mg/dL — AB (ref 65–99)
GLUCOSE-CAPILLARY: 144 mg/dL — AB (ref 65–99)
GLUCOSE-CAPILLARY: 173 mg/dL — AB (ref 65–99)
Glucose-Capillary: 118 mg/dL — ABNORMAL HIGH (ref 65–99)
Glucose-Capillary: 125 mg/dL — ABNORMAL HIGH (ref 65–99)
Glucose-Capillary: 129 mg/dL — ABNORMAL HIGH (ref 65–99)

## 2016-03-19 LAB — PROCALCITONIN: PROCALCITONIN: 0.74 ng/mL

## 2016-03-19 LAB — HEPARIN LEVEL (UNFRACTIONATED)
HEPARIN UNFRACTIONATED: 0.28 [IU]/mL — AB (ref 0.30–0.70)
HEPARIN UNFRACTIONATED: 0.47 [IU]/mL (ref 0.30–0.70)
HEPARIN UNFRACTIONATED: 0.95 [IU]/mL — AB (ref 0.30–0.70)

## 2016-03-19 MED ORDER — METHYLPREDNISOLONE SODIUM SUCC 40 MG IJ SOLR
40.0000 mg | Freq: Two times a day (BID) | INTRAMUSCULAR | Status: AC
  Administered 2016-03-19 – 2016-03-20 (×2): 40 mg via INTRAVENOUS
  Filled 2016-03-19 (×2): qty 1

## 2016-03-19 MED ORDER — OSELTAMIVIR PHOSPHATE 30 MG PO CAPS
30.0000 mg | ORAL_CAPSULE | Freq: Two times a day (BID) | ORAL | Status: DC
  Administered 2016-03-19 – 2016-03-21 (×5): 30 mg via ORAL
  Filled 2016-03-19 (×5): qty 1

## 2016-03-19 MED ORDER — DIPHENHYDRAMINE HCL 25 MG PO CAPS
25.0000 mg | ORAL_CAPSULE | Freq: Once | ORAL | Status: DC
  Filled 2016-03-19: qty 1

## 2016-03-19 MED ORDER — GUAIFENESIN 100 MG/5ML PO SOLN
5.0000 mL | ORAL | Status: DC | PRN
  Administered 2016-03-19 – 2016-03-21 (×6): 100 mg via ORAL
  Filled 2016-03-19 (×6): qty 5

## 2016-03-19 MED ORDER — DIPHENHYDRAMINE HCL 50 MG/ML IJ SOLN
12.5000 mg | Freq: Once | INTRAMUSCULAR | Status: AC
  Administered 2016-03-19: 12.5 mg via INTRAVENOUS
  Filled 2016-03-19: qty 1

## 2016-03-19 NOTE — Progress Notes (Signed)
Progress Note  Patient Name: Joseph Berg Date of Encounter: 03/19/2016  Primary Cardiologist: Stanford Breed  Subjective   83 yo man , hx of CAD, CABG , ischemic CM. Admitted with the flu  troponin levels + - ( but with a flat trend ) not suggestive of ACS  Echo shows moderate LV dysfunction with EF 43-15% Grade 1 diastolic dysfunction  This EF is slighly lower EF    Inpatient Medications    Scheduled Meds: . albuterol  2.5 mg Nebulization Q6H  . arformoterol  15 mcg Nebulization BID  . aspirin EC  325 mg Oral Daily  . budesonide (PULMICORT) nebulizer solution  0.5 mg Nebulization BID  . carvedilol  12.5 mg Oral BID WC  . cefTRIAXone (ROCEPHIN)  IV  2 g Intravenous Q24H  . finasteride  5 mg Oral Daily  . methylPREDNISolone (SOLU-MEDROL) injection  40 mg Intravenous Q12H  . oseltamivir  30 mg Oral BID  . rosuvastatin  40 mg Oral Daily  . sodium chloride flush  3 mL Intravenous Q12H   Continuous Infusions: . heparin 650 Units/hr (03/19/16 0048)   PRN Meds: acetaminophen **OR** acetaminophen, albuterol, guaiFENesin, ondansetron **OR** ondansetron (ZOFRAN) IV   Vital Signs    Vitals:   03/18/16 2301 03/19/16 0244 03/19/16 0324 03/19/16 0459  BP:    (!) 114/49  Pulse:    75  Resp:    18  Temp:    98.8 F (37.1 C)  TempSrc:    Oral  SpO2: 98%  97% 99%  Weight:  211 lb (95.7 kg)    Height:        Intake/Output Summary (Last 24 hours) at 03/19/16 1234 Last data filed at 03/19/16 0655  Gross per 24 hour  Intake            349.5 ml  Output              850 ml  Net           -500.5 ml   Filed Weights   03/17/16 2202 03/18/16 0500 03/19/16 0244  Weight: 211 lb 3.2 oz (95.8 kg) 211 lb 3.2 oz (95.8 kg) 211 lb (95.7 kg)    Telemetry    NSR  - Personally Reviewed  ECG     Physical Exam   GEN: generally ok,  Clearly does not feel well  Neck: No JVD Cardiac: RRR, no murmurs, rubs, or gallops.  Respiratory: wheezes,   Coughing  GI: Soft, nontender,  non-distended  MS: No edema; No deformity. Neuro:  Nonfocal  Psych: Normal affect   Labs    Chemistry Recent Labs Lab 03/17/16 1315 03/17/16 2138 03/18/16 2352  NA 141 142 140  K 4.9 4.3 4.7  CL 102 103 104  CO2 '24 27 26  '$ GLUCOSE 118* 115* 127*  BUN 44* 48* 70*  CREATININE 3.24* 2.40* 1.35*  CALCIUM 9.3 8.7* 8.5*  PROT 6.5  --   --   ALBUMIN 4.1  --   --   AST 63*  --   --   ALT 43  --   --   ALKPHOS 39  --   --   BILITOT 1.0  --   --   GFRNONAA 17* 24* 48*  GFRAA 19* 28* 56*  ANIONGAP '15 12 10     '$ Hematology Recent Labs Lab 03/17/16 2138 03/18/16 0530 03/19/16 0837  WBC 10.3 12.6* 13.8*  RBC 4.73 4.48 4.32  HGB 14.1 13.4 12.9*  HCT 44.5  42.3 40.5  MCV 94.1 94.4 93.8  MCH 29.8 29.9 29.9  MCHC 31.7 31.7 31.9  RDW 14.1 14.4 14.1  PLT 104* 103* 133*    Cardiac Enzymes Recent Labs Lab 03/17/16 1834 03/17/16 2138 03/18/16 0235 03/18/16 0859  TROPONINI 3.98* 4.92* 5.37* 3.05*   No results for input(s): TROPIPOC in the last 168 hours.   BNPNo results for input(s): BNP, PROBNP in the last 168 hours.   DDimer No results for input(s): DDIMER in the last 168 hours.   Radiology    Dg Chest 2 View  Result Date: 03/17/2016 CLINICAL DATA:  Shortness of breath, nausea and vomiting cough for the past day. History of CAD, CHF an pneumonia. Former smoker. EXAM: CHEST  2 VIEW COMPARISON:  03/26/2015; 12/13/2013 FINDINGS: Grossly unchanged enlarged cardiac silhouette and mediastinal contours post median sternotomy and CABG. There is unchanged tortuosity and potential ectasia of the thoracic aorta. Atherosclerotic plaque within the thoracic aorta. The lungs remain hyperexpanded with flattening of the diaphragms. Veiling opacities overlying the bilateral lower lungs are unchanged in favored to represent overlying soft tissues. Unchanged pleuroparenchymal thickening about the bilateral major and the right minor fissures. No evidence of edema. No acute osseus abnormalities.  Old/ healed fractures involving the posterolateral aspects of the left fourth and fifth ribs. Stigmata DISH within thoracic spine. IMPRESSION: 1. Similar findings of cardiomegaly, lung hyperexpansion and bronchitic change without superimposed acute cardiopulmonary disease. 2.  Aortic Atherosclerosis (ICD10-170.0) 3. Old left-sided rib fractures. Electronically Signed   By: Sandi Mariscal M.D.   On: 03/17/2016 13:52   Dg Chest Port 1 View  Result Date: 03/17/2016 CLINICAL DATA:  Acute respiratory distress EXAM: PORTABLE CHEST 1 VIEW COMPARISON:  1318 hours on the same day FINDINGS: The patient is status post CABG. There is aortic atherosclerosis without aneurysm. Patchy airspace opacities at each lung base suspicious for pneumonia and/or atelectasis. Mild vascular congestion is seen. Trace left effusion. No pneumothorax or acute osseous abnormality. Chronic left fifth rib fracture identified on current exam. IMPRESSION: Patchy airspace opacities at the lung bases are suspicious for pneumonia and/or atelectasis. Mild CHF with trace left effusion. Electronically Signed   By: Ashley Royalty M.D.   On: 03/17/2016 20:50    Cardiac Studies     Patient Profile     83 y.o. male hx of CAD, CABG, chronic CHF   Assessment & Plan    1.   CAD - troponins are mildly elevated, flat trend - c/w demand ischemia in the setting of his influenza. Not suggestive of ACS  2. Chronic systolic cHF:   EF is perhaps slighlty lower .   Not siginficantly different . Will not plan for any further eval at this time. Will have him follow up with dr. Stanford Breed   Will sign off.  Call for questions   Signed, Mertie Moores, MD  03/19/2016, 12:34 PM

## 2016-03-19 NOTE — Progress Notes (Addendum)
ANTICOAGULATION CONSULT NOTE - Follow Up Consult  Pharmacy Consult for Heparin Indication: chest pain/ACS  Allergies  Allergen Reactions  . Hctz [Hydrochlorothiazide] Other (See Comments)    Affected renal function.     Patient Measurements: Height: '5\' 9"'$  (175.3 cm) Weight: 211 lb (95.7 kg) IBW/kg (Calculated) : 70.7 Heparin Dosing Weight: 90.6 kg  Vital Signs: Temp: 98.8 F (37.1 C) (02/10 0459) Temp Source: Oral (02/10 0459) BP: 114/49 (02/10 0459) Pulse Rate: 75 (02/10 0459)  Labs:  Recent Labs  03/17/16 1315  03/17/16 2138 03/18/16 0235 03/18/16 0530 03/18/16 0859  03/18/16 2352 03/19/16 0837 03/19/16 1644  HGB 15.3  --  14.1  --  13.4  --   --   --  12.9*  --   HCT 47.4  --  44.5  --  42.3  --   --   --  40.5  --   PLT 116*  --  104*  --  103*  --   --   --  133*  --   HEPARINUNFRC  --   --   --   --  0.79*  --   < > 0.95* 0.47 0.28*  CREATININE 3.24*  --  2.40*  --   --   --   --  1.35*  --   --   TROPONINI  --   < > 4.92* 5.37*  --  3.05*  --   --   --   --   < > = values in this interval not displayed.  Estimated Creatinine Clearance: 49.8 mL/min (by C-G formula based on SCr of 1.35 mg/dL (H)).   Medications:  Infusions:  . heparin 650 Units/hr (03/19/16 0048)    Assessment: 83 year old male on IV heparin for chest pain/ACS. Heparin level is now therapeutic at 0.47 on heparin 650 units/hr and nurse reports no further hematuria.  Goal of Therapy:  Heparin level 0.3-0.7 units/ml Monitor platelets by anticoagulation protocol: Yes   Plan:  Heparin  650 units/hr  8h confirmatory HL Daily HL/CBC  Andrey Cota. Diona Foley, PharmD, BCPS Clinical Pharmacist [phone] 03/19/2016,10:22 AM  ADDN:  Repeat heparin level is slightly subtherapeutic at 0.28 on heparin 650 units/hr. Nurse reports no issues with infusion or bleeding.  Increase heparin to 750 units/hr and recheck a level with AM labs.  Andrey Cota. Diona Foley, PharmD, BCPS Clinical Pharmacist

## 2016-03-19 NOTE — Progress Notes (Signed)
PHARMACY NOTE:  ANTIMICROBIAL RENAL DOSAGE ADJUSTMENT  Current antimicrobial regimen includes a mismatch between antimicrobial dosage and estimated renal function.  As per policy approved by the Pharmacy & Therapeutics and Medical Executive Committees, the antimicrobial dosage will be adjusted accordingly.  Current antimicrobial dosage:  Tamiflu 30 mg daily  Indication: Influenza  Renal Function improved.  Estimated Creatinine Clearance: 49.8 mL/min (by C-G formula based on SCr of 1.35 mg/dL (H)).  Antimicrobial dosage has been changed to:  Tamiflu 30 mg BID   Thank you for allowing pharmacy to be a part of this patient's care.  Sherlon Handing, PharmD, BCPS Clinical pharmacist, pager [phone] 03/19/2016 4:04 AM

## 2016-03-19 NOTE — Progress Notes (Signed)
ANTICOAGULATION CONSULT NOTE - Follow Up Consult  Pharmacy Consult for Heparin Indication: chest pain/ACS  Allergies  Allergen Reactions  . Hctz [Hydrochlorothiazide] Other (See Comments)    Affected renal function.     Patient Measurements: Height: '5\' 9"'$  (175.3 cm) Weight: 211 lb 3.2 oz (95.8 kg) IBW/kg (Calculated) : 70.7 Heparin Dosing Weight: 90.6 kg  Vital Signs: Temp: 98.2 F (36.8 C) (02/09 2108) Temp Source: Oral (02/09 1823) BP: 103/57 (02/09 2108) Pulse Rate: 75 (02/09 2108)  Labs:  Recent Labs  03/17/16 1315  03/17/16 2138 03/18/16 0235 03/18/16 0530 03/18/16 0859 03/18/16 1358 03/18/16 2352  HGB 15.3  --  14.1  --  13.4  --   --   --   HCT 47.4  --  44.5  --  42.3  --   --   --   PLT 116*  --  104*  --  103*  --   --   --   HEPARINUNFRC  --   --   --   --  0.79*  --  0.99* 0.95*  CREATININE 3.24*  --  2.40*  --   --   --   --   --   TROPONINI  --   < > 4.92* 5.37*  --  3.05*  --   --   < > = values in this interval not displayed.  Estimated Creatinine Clearance: 28 mL/min (by C-G formula based on SCr of 2.4 mg/dL (H)).   Medications:  Infusions:  . sodium chloride 50 mL/hr at 03/18/16 1202  . heparin 900 Units/hr (03/18/16 1627)    Assessment: 83 year old male on IV heparin for chest pain/ACS. Heparin level remains SUPRAtherapeutic (0.95) after rate reduction. Hematuria continues (kind of coca cola colored not bright red) - stable from earlier check.  Goal of Therapy:  Heparin level 0.3-0.7 units/ml Monitor platelets by anticoagulation protocol: Yes   Plan:  Decrease heparin to 650 units/hr.  Recheck heparin level in 8 hours.   Sherlon Handing, PharmD, BCPS Clinical pharmacist, pager [phone] 03/19/2016,12:45 AM

## 2016-03-19 NOTE — Evaluation (Signed)
Clinical/Bedside Swallow Evaluation Patient Details  Name: Joseph Berg MRN: [id number] Date of Birth: May 28, 1933  Today's Date: 03/19/2016 Time: SLP Start Time (ACUTE ONLY): 11 SLP Stop Time (ACUTE ONLY): 1056 SLP Time Calculation (min) (ACUTE ONLY): 15 min  Past Medical History:  Past Medical History:  Diagnosis Date  . BPH (benign prostatic hyperplasia)   . CAD (coronary artery disease)    a. s/p CABG x1 with LIMA-LAD 1994. b. LM & 3v CAD by cath 07/2012  . CHF (congestive heart failure) ([place])    a. EF 35-40% by echo 07/2012.  [name] Complication of anesthesia    affected patients memory. Pt stated "I couldn't remember anything for three months...just bits and pieces"  . Emphysema    a. Moderate emphysema by CT 06/2012.  [name] Heart attack 1994  . Hyperlipidemia   . Hypertension   . Hypoparathyroidism ([place])   . NSVT (nonsustained ventricular tachycardia) ([place])    a. Seen on tele 07/2012.  . OSA on CPAP    pt stated he does not wear CPAP because he no longer has sleep apnea  . Pneumonia   . Pseudoaneurysm of left femoral artery ([place])    a. post cath s/p compression 07/2012, associated w/ anemia.  . Sciatica 2012   Qualifier: Diagnosis of  By: Madilyn Fireman MD, Barnetta Chapel     Past Surgical History:  Past Surgical History:  Procedure Laterality Date  . CATARACT EXTRACTION, BILATERAL    . COLONOSCOPY    . CORONARY ARTERY BYPASS GRAFT  03-23-92  . CORONARY ARTERY BYPASS GRAFT N/A 08/14/2012   Procedure: REDO CORONARY ARTERY BYPASS GRAFTING (CABG);  Surgeon: Gaye Pollack, MD;  Location: [facility];  Service: Open Heart Surgery;  Laterality: N/A;  . INTRAOPERATIVE TRANSESOPHAGEAL ECHOCARDIOGRAM N/A 08/14/2012   Procedure: INTRAOPERATIVE TRANSESOPHAGEAL ECHOCARDIOGRAM;  Surgeon: Gaye Pollack, MD;  Location: [facility];  Service: Open Heart Surgery;  Laterality: N/A;  . LUMBAR LAMINECTOMY/DECOMPRESSION MICRODISCECTOMY Left 02/18/2015   Procedure: LUMBAR LAMINECTOMY/DECOMPRESSION MICRODISCECTOMY- LEFT  FOUR-FIVE ;  Surgeon: Ashok Pall, MD;  Location: [facility];  Service: Neurosurgery;  Laterality: Left;   HPI:  83 y.o.malewith medical history significant of BPH, pna,CAD status post CABG, COPD, OSA, CHF with an EF of 35%, hyperlipidemia, hypertension, hypoparathyroidism, sciatica presenting w/ progressive dry cough and shortness of breath. Diagnosed with influenza and started on Tamiflu. CXR patchy airspace opacities at the lung bases are suspicious for   Assessment / Plan / Recommendation Clinical Impression  Coughing noted upon SLP's arrival. Pt's risk factor for aspiration is mildly increased due to history of emphysema, explained to pt and spouse. Coughing during swallow assessment which pt stated did not feel abnormally different from current with flu. Educated pt to remain upright following meals, take rest breaks if needed. Recommend regular texture and thin liquids. Will attempt to follow up x 1.       Aspiration Risk   (mild-mod)    Diet Recommendation Regular;Thin liquid   Liquid Administration via: Straw;Cup Medication Administration: Whole meds with liquid Supervision: Patient able to self feed Compensations: Slow rate;Small sips/bites Postural Changes: Remain upright for at least 30 minutes after po intake;Seated upright at 90 degrees    Other  Recommendations Oral Care Recommendations: Oral care BID   Follow up Recommendations None      Frequency and Duration min 1 x/week  1 week       Prognosis Prognosis for Safe Diet Advancement: Good      Swallow Study   General  HPI: 83 y.o.malewith medical history significant of BPH, pna,CAD status post CABG, COPD, OSA, CHF with an EF of 35%, hyperlipidemia, hypertension, hypoparathyroidism, sciatica presenting w/ progressive dry cough and shortness of breath. Diagnosed with influenza and started on Tamiflu. CXR patchy airspace opacities at the lung bases are suspicious for Type of Study: Bedside Swallow  Evaluation Previous Swallow Assessment: none Diet Prior to this Study: NPO Temperature Spikes Noted: No Respiratory Status: Nasal cannula History of Recent Intubation: No Behavior/Cognition: Alert;Cooperative;Pleasant mood Oral Cavity Assessment: Within Functional Limits Oral Care Completed by SLP: No Oral Cavity - Dentition: Adequate natural dentition Vision: Functional for self-feeding Self-Feeding Abilities: Able to feed self Patient Positioning: Upright in bed Baseline Vocal Quality: Normal Volitional Cough: Strong Volitional Swallow: Able to elicit    Oral/Motor/Sensory Function Overall Oral Motor/Sensory Function: Within functional limits   Ice Chips Ice chips: Not tested   Thin Liquid Thin Liquid: Impaired Presentation: Cup;Straw Oral Phase Impairments:  (none) Oral Phase Functional Implications:  (none) Pharyngeal  Phase Impairments: Cough - Delayed    Nectar Thick Nectar Thick Liquid: Not tested   Honey Thick Honey Thick Liquid: Not tested   Puree Puree: Impaired Presentation: Self Fed;Spoon Pharyngeal Phase Impairments: Cough - Delayed   Solid   GO   Solid: Within functional limits        [place] Siren 03/19/2016,11:08 AM  Orbie Pyo Colvin Caroli.Ed [employer] [phone]

## 2016-03-19 NOTE — Progress Notes (Signed)
PROGRESS NOTE    Joseph Berg  GGY:[id number] DOB: 12-11-1933 DOA: 03/17/2016 PCP: Beatrice Lecher, MD  Brief Narrative:Joseph Berg is a 83 y.o. male with medical history significant of BPH, CAD status post CABG, CHF with an EF of 35%, hyperlipidemia, hypertension, hypoparathyroidism, sciatica presenting w/ progressive dry cough and shortness of breath. Started on 03/15/2016. Seen by primary care physician one day ago and diagnosed with influenza and started on Tamiflu. At that time patient was counseled to go to the emergency room due to hypoxemia with O2 saturations of 84%  Assessment & Plan:  Acute Respiratory Failure: -due to FLu, COPD exacerbation, NSTEMI -improving -continue Tamiflu, ceftriaxone, IV solumedrol, nebs -Off BIPAP now -will cut down steroids  NSTEMI vs Demand ischemia -h/o CAD s/p CABG -Troponin upto 5.3 -Cards following, on IV heparin -continue ASA,  coreg -FU ECHO with EF 35-40% from 40-45%  COPD exacerbation due to FLu -as above  Acute kidney injury: Creatinine 3.24. Baseline 0.9.  -improving now 1.4 -holding ARB -stop IVF  Hypotension: Likely secondary to continuation of home blood pressure medications and diuretics in the setting of little to no fluid intake for 3 days. - Hold coreg, Lasix, resume  cozaar  Dysphagia -SLP eval today  Hyperglycemia: -no h/o DM - CBG AC HS, FU [id number]  Chronic systolic congestive heart failure: Last echo from 2015 showing EF of 93% and mild diastolic dysfunction.  -on gentle  IV hydration due to AK I from severe dehydration. -repeat ECHO  BPH: - continue Proscar  Peripheral neuropathy: - continue neurontin  DVT prophylaxis: IV Hep  Code Status: FULL  Family Communication: wife  Disposition Plan: home when resp status improved    Consultants:   PCCM/cards   Antimicrobials:    Subjective: Breathing better  Objective: Vitals:   03/18/16 2301 03/19/16 0244 03/19/16 0324  03/19/16 0459  BP:    (!) 114/49  Pulse:    75  Resp:    18  Temp:    98.8 F (37.1 C)  TempSrc:    Oral  SpO2: 98%  97% 99%  Weight:  95.7 kg (211 lb)    Height:        Intake/Output Summary (Last 24 hours) at 03/19/16 1142 Last data filed at 03/19/16 0655  Gross per 24 hour  Intake            360.5 ml  Output              850 ml  Net           -489.5 ml   Filed Weights   03/17/16 2202 03/18/16 0500 03/19/16 0244  Weight: 95.8 kg (211 lb 3.2 oz) 95.8 kg (211 lb 3.2 oz) 95.7 kg (211 lb)    Examination:  General exam: AAOx3 Respiratory system: few basilar ronchi, poor air movement Cardiovascular system: S1 & S2 heard, RRR. No JVD, murmurs Gastrointestinal system: Abdomen is nondistended, soft and nontender. No organomegaly or masses felt. Normal bowel sounds heard. Central nervous system: Alert and oriented. No focal neurological deficits. Extremities: Symmetric 5 x 5 power. Skin: No rashes, lesions or ulcers Psychiatry: Judgement and insight appear normal. Mood & affect appropriate.     Data Reviewed: I have personally reviewed following labs and imaging studies  CBC:  Recent Labs Lab 03/17/16 1315 03/17/16 2138 03/18/16 0530 03/19/16 0837  WBC 10.5 10.3 12.6* 13.8*  NEUTROABS 8.4*  --   --   --   HGB 15.3 14.1 13.4 12.9*  HCT 47.4 44.5 42.3 40.5  MCV 94.0 94.1 94.4 93.8  PLT 116* 104* 103* 017*   Basic Metabolic Panel:  Recent Labs Lab 03/17/16 1315 03/17/16 2138 03/18/16 2352  NA 141 142 140  K 4.9 4.3 4.7  CL 102 103 104  CO2 '24 27 26  '$ GLUCOSE 118* 115* 127*  BUN 44* 48* 70*  CREATININE 3.24* 2.40* 1.35*  CALCIUM 9.3 8.7* 8.5*   GFR: Estimated Creatinine Clearance: 49.8 mL/min (by C-G formula based on SCr of 1.35 mg/dL (H)). Liver Function Tests:  Recent Labs Lab 03/17/16 1315  AST 63*  ALT 43  ALKPHOS 39  BILITOT 1.0  PROT 6.5  ALBUMIN 4.1   No results for input(s): LIPASE, AMYLASE in the last 168 hours. No results for input(s):  AMMONIA in the last 168 hours. Coagulation Profile: No results for input(s): INR, PROTIME in the last 168 hours. Cardiac Enzymes:  Recent Labs Lab 03/17/16 1834 03/17/16 2138 03/18/16 0235 03/18/16 0859  TROPONINI 3.98* 4.92* 5.37* 3.05*   BNP (last 3 results) No results for input(s): PROBNP in the last 8760 hours. HbA1C:  Recent Labs  03/17/16 1630  HGBA1C 5.9*   CBG:  Recent Labs Lab 03/18/16 1553 03/18/16 1930 03/19/16 0002 03/19/16 0358 03/19/16 0825  GLUCAP 141* 114* 129* 118* 125*   Lipid Profile: No results for input(s): CHOL, HDL, LDLCALC, TRIG, CHOLHDL, LDLDIRECT in the last 72 hours. Thyroid Function Tests: No results for input(s): TSH, T4TOTAL, FREET4, T3FREE, THYROIDAB in the last 72 hours. Anemia Panel: No results for input(s): VITAMINB12, FOLATE, FERRITIN, TIBC, IRON, RETICCTPCT in the last 72 hours. Urine analysis:    Component Value Date/Time   COLORURINE YELLOW 03/17/2016 2203   APPEARANCEUR HAZY (A) 03/17/2016 2203   LABSPEC 1.018 03/17/2016 2203   PHURINE 5.0 03/17/2016 2203   GLUCOSEU NEGATIVE 03/17/2016 2203   HGBUR SMALL (A) 03/17/2016 2203   BILIRUBINUR NEGATIVE 03/17/2016 2203   BILIRUBINUR neg 10/31/2011 1121   KETONESUR NEGATIVE 03/17/2016 2203   PROTEINUR 30 (A) 03/17/2016 2203   UROBILINOGEN 2.0 (H) 08/09/2012 1316   NITRITE NEGATIVE 03/17/2016 2203   LEUKOCYTESUR NEGATIVE 03/17/2016 2203   Sepsis Labs: '@LABRCNTIP'$ (procalcitonin:4,lacticidven:4)  ) Recent Results (from the past 240 hour(s))  Respiratory Panel by PCR     Status: Abnormal   Collection Time: 03/17/16  6:10 PM  Result Value Ref Range Status   Adenovirus NOT DETECTED NOT DETECTED Final   Coronavirus 229E NOT DETECTED NOT DETECTED Final   Coronavirus HKU1 NOT DETECTED NOT DETECTED Final   Coronavirus NL63 NOT DETECTED NOT DETECTED Final   Coronavirus OC43 NOT DETECTED NOT DETECTED Final   Metapneumovirus NOT DETECTED NOT DETECTED Final   Rhinovirus /  Enterovirus NOT DETECTED NOT DETECTED Final   Influenza A H3 DETECTED (A) NOT DETECTED Final   Influenza B NOT DETECTED NOT DETECTED Final   Parainfluenza Virus 1 NOT DETECTED NOT DETECTED Final   Parainfluenza Virus 2 NOT DETECTED NOT DETECTED Final   Parainfluenza Virus 3 NOT DETECTED NOT DETECTED Final   Parainfluenza Virus 4 NOT DETECTED NOT DETECTED Final   Respiratory Syncytial Virus NOT DETECTED NOT DETECTED Final   Bordetella pertussis NOT DETECTED NOT DETECTED Final   Chlamydophila pneumoniae NOT DETECTED NOT DETECTED Final   Mycoplasma pneumoniae NOT DETECTED NOT DETECTED Final  MRSA PCR Screening     Status: None   Collection Time: 03/17/16  9:31 PM  Result Value Ref Range Status   MRSA by PCR NEGATIVE NEGATIVE Final  Comment:        The GeneXpert MRSA Assay (FDA approved for NASAL specimens only), is one component of a comprehensive MRSA colonization surveillance program. It is not intended to diagnose MRSA infection nor to guide or monitor treatment for MRSA infections.          Radiology Studies: Dg Chest 2 View  Result Date: 03/17/2016 CLINICAL DATA:  Shortness of breath, nausea and vomiting cough for the past day. History of CAD, CHF an pneumonia. Former smoker. EXAM: CHEST  2 VIEW COMPARISON:  03/26/2015; 12/13/2013 FINDINGS: Grossly unchanged enlarged cardiac silhouette and mediastinal contours post median sternotomy and CABG. There is unchanged tortuosity and potential ectasia of the thoracic aorta. Atherosclerotic plaque within the thoracic aorta. The lungs remain hyperexpanded with flattening of the diaphragms. Veiling opacities overlying the bilateral lower lungs are unchanged in favored to represent overlying soft tissues. Unchanged pleuroparenchymal thickening about the bilateral major and the right minor fissures. No evidence of edema. No acute osseus abnormalities. Old/ healed fractures involving the posterolateral aspects of the left fourth and fifth  ribs. Stigmata DISH within thoracic spine. IMPRESSION: 1. Similar findings of cardiomegaly, lung hyperexpansion and bronchitic change without superimposed acute cardiopulmonary disease. 2.  Aortic Atherosclerosis (ICD10-170.0) 3. Old left-sided rib fractures. Electronically Signed   By: Sandi Mariscal M.D.   On: 03/17/2016 13:52   Dg Chest Port 1 View  Result Date: 03/17/2016 CLINICAL DATA:  Acute respiratory distress EXAM: PORTABLE CHEST 1 VIEW COMPARISON:  1318 hours on the same day FINDINGS: The patient is status post CABG. There is aortic atherosclerosis without aneurysm. Patchy airspace opacities at each lung base suspicious for pneumonia and/or atelectasis. Mild vascular congestion is seen. Trace left effusion. No pneumothorax or acute osseous abnormality. Chronic left fifth rib fracture identified on current exam. IMPRESSION: Patchy airspace opacities at the lung bases are suspicious for pneumonia and/or atelectasis. Mild CHF with trace left effusion. Electronically Signed   By: Ashley Royalty M.D.   On: 03/17/2016 20:50        Scheduled Meds: . albuterol  2.5 mg Nebulization Q6H  . arformoterol  15 mcg Nebulization BID  . aspirin EC  325 mg Oral Daily  . budesonide (PULMICORT) nebulizer solution  0.5 mg Nebulization BID  . carvedilol  12.5 mg Oral BID WC  . cefTRIAXone (ROCEPHIN)  IV  2 g Intravenous Q24H  . finasteride  5 mg Oral Daily  . oseltamivir  30 mg Oral BID  . rosuvastatin  40 mg Oral Daily  . sodium chloride flush  3 mL Intravenous Q12H   Continuous Infusions: . heparin 650 Units/hr (03/19/16 0048)     LOS: 1 day    Time spent: 104mn    PDomenic Polite MD [facility] Pager [phone] If 7PM-7AM, please contact night-coverage [email or web address] Password [email or web address] 03/19/2016, 11:42 AM

## 2016-03-20 DIAGNOSIS — I248 Other forms of acute ischemic heart disease: Secondary | ICD-10-CM

## 2016-03-20 LAB — CBC
HEMATOCRIT: 37.8 % — AB (ref 39.0–52.0)
HEMOGLOBIN: 12.2 g/dL — AB (ref 13.0–17.0)
MCH: 29.8 pg (ref 26.0–34.0)
MCHC: 32.3 g/dL (ref 30.0–36.0)
MCV: 92.4 fL (ref 78.0–100.0)
Platelets: 131 10*3/uL — ABNORMAL LOW (ref 150–400)
RBC: 4.09 MIL/uL — AB (ref 4.22–5.81)
RDW: 13.8 % (ref 11.5–15.5)
WBC: 11.3 10*3/uL — AB (ref 4.0–10.5)

## 2016-03-20 LAB — BASIC METABOLIC PANEL
ANION GAP: 9 (ref 5–15)
BUN: 69 mg/dL — AB (ref 6–20)
CHLORIDE: 105 mmol/L (ref 101–111)
CO2: 26 mmol/L (ref 22–32)
Calcium: 8.8 mg/dL — ABNORMAL LOW (ref 8.9–10.3)
Creatinine, Ser: 0.99 mg/dL (ref 0.61–1.24)
Glucose, Bld: 148 mg/dL — ABNORMAL HIGH (ref 65–99)
POTASSIUM: 4.5 mmol/L (ref 3.5–5.1)
SODIUM: 140 mmol/L (ref 135–145)

## 2016-03-20 LAB — GLUCOSE, CAPILLARY
GLUCOSE-CAPILLARY: 131 mg/dL — AB (ref 65–99)
GLUCOSE-CAPILLARY: 136 mg/dL — AB (ref 65–99)
GLUCOSE-CAPILLARY: 142 mg/dL — AB (ref 65–99)
GLUCOSE-CAPILLARY: 144 mg/dL — AB (ref 65–99)
Glucose-Capillary: 134 mg/dL — ABNORMAL HIGH (ref 65–99)
Glucose-Capillary: 148 mg/dL — ABNORMAL HIGH (ref 65–99)
Glucose-Capillary: 193 mg/dL — ABNORMAL HIGH (ref 65–99)

## 2016-03-20 LAB — HEPARIN LEVEL (UNFRACTIONATED): Heparin Unfractionated: 0.43 IU/mL (ref 0.30–0.70)

## 2016-03-20 MED ORDER — PREDNISONE 50 MG PO TABS
50.0000 mg | ORAL_TABLET | Freq: Every day | ORAL | Status: DC
  Administered 2016-03-20 – 2016-03-21 (×2): 50 mg via ORAL
  Filled 2016-03-20 (×2): qty 1

## 2016-03-20 MED ORDER — FUROSEMIDE 40 MG PO TABS
40.0000 mg | ORAL_TABLET | Freq: Every day | ORAL | Status: DC
  Administered 2016-03-20 – 2016-03-21 (×2): 40 mg via ORAL
  Filled 2016-03-20 (×2): qty 1

## 2016-03-20 NOTE — Progress Notes (Signed)
PROGRESS NOTE    Joseph Berg  GXQ:[id number] DOB: 02-21-1933 DOA: 03/17/2016 PCP: Beatrice Lecher, MD  Brief Narrative:Joseph Berg is a 83 y.o. male with medical history significant of BPH, CAD status post CABG, CHF with an EF of 35%, hyperlipidemia, hypertension, hypoparathyroidism, sciatica presenting w/ progressive dry cough and shortness of breath. Started on 03/15/2016. Seen by primary care physician one day ago and diagnosed with influenza and started on Tamiflu. At that time patient was counseled to go to the emergency room due to hypoxemia with O2 saturations of 84%  Assessment & Plan:  Acute Respiratory Failure: -required BIPAP on admission, now off -due to FLu, COPD exacerbation, NSTEMI -improving -continue Tamiflu, ceftriaxone,  -change solumedrol to prednisone taper, nebs -wean O2  NSTEMI vs Demand ischemia -h/o CAD s/p CABG -Troponin upto 5.3 -Cards consulted treated with IV heparin, continue ASA,  coreg -FU ECHO with EF 35-40% from 40-45% and diffuse hypokinesis -no further cardiac workup recommended at this time per Cards, advised FU with Dr.Croitoru  COPD exacerbation due to FLu -as above  Acute kidney injury: Creatinine 3.24. Baseline 0.9.  -improving now 1.4 -holding ARB -resolved, stopped IVF  Hypotension: Likely secondary to continuation of home blood pressure medications and diuretics in the setting of little to no fluid intake for 3 days. -resolved - Hold ARB, resume Lasix, continue coreg  Dysphagia -s/p SLP eval  -only mild asp risk, on regular diet  Hyperglycemia: -no h/o DM -from steroids, HbA1c 5.9  Chronic systolic congestive heart failure: Last echo from 2015 showing EF of 14% and mild diastolic dysfunction.  -repeat ECHO with midl drop in ECHO -resume PO lasix -FU with Dr.Croitoru  BPH: - continue Proscar  Peripheral neuropathy: - continue neurontin  DVT prophylaxis: IV Hep  Code Status: FULL  Family  Communication: wife  Disposition Plan: home tomorrow    Consultants:   PCCM/cards   Antimicrobials:    Subjective: Breathing better  Objective: Vitals:   03/19/16 2001 03/19/16 2111 03/20/16 0220 03/20/16 0512  BP:  (!) 125/57  (!) 148/71  Pulse:  70  64  Resp:  18  18  Temp:  97.9 F (36.6 C)  98.3 F (36.8 C)  TempSrc:  Oral  Oral  SpO2: 90% (!) 88% 92% 92%  Weight:    97.9 kg (215 lb 13.3 oz)  Height:        Intake/Output Summary (Last 24 hours) at 03/20/16 1058 Last data filed at 03/20/16 0526  Gross per 24 hour  Intake           437.92 ml  Output             2400 ml  Net         -1962.08 ml   Filed Weights   03/18/16 0500 03/19/16 0244 03/20/16 0512  Weight: 95.8 kg (211 lb 3.2 oz) 95.7 kg (211 lb) 97.9 kg (215 lb 13.3 oz)    Examination:  General exam: AAOx3 Respiratory system: few basilar ronchi, poor air movement Cardiovascular system: S1 & S2 heard, RRR. No JVD, murmurs Gastrointestinal system: Abdomen is nondistended, soft and nontender. No organomegaly or masses felt. Normal bowel sounds heard. Central nervous system: Alert and oriented. No focal neurological deficits. Extremities: Symmetric 5 x 5 power. Skin: No rashes, lesions or ulcers Psychiatry: Judgement and insight appear normal. Mood & affect appropriate.     Data Reviewed: I have personally reviewed following labs and imaging studies  CBC:  Recent Labs Lab 03/17/16 1315 03/17/16  2138 03/18/16 0530 03/19/16 0837 03/20/16 0520  WBC 10.5 10.3 12.6* 13.8* 11.3*  NEUTROABS 8.4*  --   --   --   --   HGB 15.3 14.1 13.4 12.9* 12.2*  HCT 47.4 44.5 42.3 40.5 37.8*  MCV 94.0 94.1 94.4 93.8 92.4  PLT 116* 104* 103* 133* 643*   Basic Metabolic Panel:  Recent Labs Lab 03/17/16 1315 03/17/16 2138 03/18/16 2352 03/20/16 0520  NA 141 142 140 140  K 4.9 4.3 4.7 4.5  CL 102 103 104 105  CO2 '24 27 26 26  '$ GLUCOSE 118* 115* 127* 148*  BUN 44* 48* 70* 69*  CREATININE 3.24* 2.40*  1.35* 0.99  CALCIUM 9.3 8.7* 8.5* 8.8*   GFR: Estimated Creatinine Clearance: 68.7 mL/min (by C-G formula based on SCr of 0.99 mg/dL). Liver Function Tests:  Recent Labs Lab 03/17/16 1315  AST 63*  ALT 43  ALKPHOS 39  BILITOT 1.0  PROT 6.5  ALBUMIN 4.1   No results for input(s): LIPASE, AMYLASE in the last 168 hours. No results for input(s): AMMONIA in the last 168 hours. Coagulation Profile: No results for input(s): INR, PROTIME in the last 168 hours. Cardiac Enzymes:  Recent Labs Lab 03/17/16 1834 03/17/16 2138 03/18/16 0235 03/18/16 0859  TROPONINI 3.98* 4.92* 5.37* 3.05*   BNP (last 3 results) No results for input(s): PROBNP in the last 8760 hours. HbA1C:  Recent Labs  03/17/16 1630  HGBA1C 5.9*   CBG:  Recent Labs Lab 03/19/16 1720 03/19/16 2025 03/20/16 0002 03/20/16 0510 03/20/16 0800  GLUCAP 144* 173* 142* 144* 131*   Lipid Profile: No results for input(s): CHOL, HDL, LDLCALC, TRIG, CHOLHDL, LDLDIRECT in the last 72 hours. Thyroid Function Tests: No results for input(s): TSH, T4TOTAL, FREET4, T3FREE, THYROIDAB in the last 72 hours. Anemia Panel: No results for input(s): VITAMINB12, FOLATE, FERRITIN, TIBC, IRON, RETICCTPCT in the last 72 hours. Urine analysis:    Component Value Date/Time   COLORURINE YELLOW 03/17/2016 2203   APPEARANCEUR HAZY (A) 03/17/2016 2203   LABSPEC 1.018 03/17/2016 2203   PHURINE 5.0 03/17/2016 2203   GLUCOSEU NEGATIVE 03/17/2016 2203   HGBUR SMALL (A) 03/17/2016 2203   BILIRUBINUR NEGATIVE 03/17/2016 2203   BILIRUBINUR neg 10/31/2011 1121   KETONESUR NEGATIVE 03/17/2016 2203   PROTEINUR 30 (A) 03/17/2016 2203   UROBILINOGEN 2.0 (H) 08/09/2012 1316   NITRITE NEGATIVE 03/17/2016 2203   LEUKOCYTESUR NEGATIVE 03/17/2016 2203   Sepsis Labs: '@LABRCNTIP'$ (procalcitonin:4,lacticidven:4)  ) Recent Results (from the past 240 hour(s))  Respiratory Panel by PCR     Status: Abnormal   Collection Time: 03/17/16  6:10 PM    Result Value Ref Range Status   Adenovirus NOT DETECTED NOT DETECTED Final   Coronavirus 229E NOT DETECTED NOT DETECTED Final   Coronavirus HKU1 NOT DETECTED NOT DETECTED Final   Coronavirus NL63 NOT DETECTED NOT DETECTED Final   Coronavirus OC43 NOT DETECTED NOT DETECTED Final   Metapneumovirus NOT DETECTED NOT DETECTED Final   Rhinovirus / Enterovirus NOT DETECTED NOT DETECTED Final   Influenza A H3 DETECTED (A) NOT DETECTED Final   Influenza B NOT DETECTED NOT DETECTED Final   Parainfluenza Virus 1 NOT DETECTED NOT DETECTED Final   Parainfluenza Virus 2 NOT DETECTED NOT DETECTED Final   Parainfluenza Virus 3 NOT DETECTED NOT DETECTED Final   Parainfluenza Virus 4 NOT DETECTED NOT DETECTED Final   Respiratory Syncytial Virus NOT DETECTED NOT DETECTED Final   Bordetella pertussis NOT DETECTED NOT DETECTED Final   Chlamydophila  pneumoniae NOT DETECTED NOT DETECTED Final   Mycoplasma pneumoniae NOT DETECTED NOT DETECTED Final  MRSA PCR Screening     Status: None   Collection Time: 03/17/16  9:31 PM  Result Value Ref Range Status   MRSA by PCR NEGATIVE NEGATIVE Final    Comment:        The GeneXpert MRSA Assay (FDA approved for NASAL specimens only), is one component of a comprehensive MRSA colonization surveillance program. It is not intended to diagnose MRSA infection nor to guide or monitor treatment for MRSA infections.          Radiology Studies: No results found.      Scheduled Meds: . albuterol  2.5 mg Nebulization Q6H  . arformoterol  15 mcg Nebulization BID  . aspirin EC  325 mg Oral Daily  . budesonide (PULMICORT) nebulizer solution  0.5 mg Nebulization BID  . carvedilol  12.5 mg Oral BID WC  . cefTRIAXone (ROCEPHIN)  IV  2 g Intravenous Q24H  . finasteride  5 mg Oral Daily  . furosemide  40 mg Oral Daily  . oseltamivir  30 mg Oral BID  . predniSONE  50 mg Oral Q breakfast  . rosuvastatin  40 mg Oral Daily  . sodium chloride flush  3 mL Intravenous  Q12H   Continuous Infusions:    LOS: 2 days    Time spent: 34mn    PDomenic Polite MD [facility] Pager [phone] If 7PM-7AM, please contact night-coverage [email or web address] Password [email or web address]2/12/2016, 10:58 AM

## 2016-03-20 NOTE — Progress Notes (Signed)
ANTICOAGULATION CONSULT NOTE - Follow Up Consult  Pharmacy Consult for heparin Indication: chest pain/ACS  Labs:  Recent Labs  03/17/16 2138 03/18/16 0235 03/18/16 0530 03/18/16 0859  03/18/16 2352 03/19/16 0837 03/19/16 1644 03/20/16 0520  HGB 14.1  --  13.4  --   --   --  12.9*  --  12.2*  HCT 44.5  --  42.3  --   --   --  40.5  --  37.8*  PLT 104*  --  103*  --   --   --  133*  --  131*  HEPARINUNFRC  --   --  0.79*  --   < > 0.95* 0.47 0.28* 0.43  CREATININE 2.40*  --   --   --   --  1.35*  --   --  0.99  TROPONINI 4.92* 5.37*  --  3.05*  --   --   --   --   --   < > = values in this interval not displayed.   Assessment/Plan:  83yo male therapeutic on heparin after rate change. Will continue gtt at current rate and confirm stable with additional level.   Wynona Neat, PharmD, BCPS  03/20/2016,6:15 AM

## 2016-03-20 NOTE — Progress Notes (Signed)
Progress Note  Patient Name: Joseph Berg Date of Encounter: 03/20/2016  Primary Cardiologist: Stanford Breed  Subjective   No new complaints , no CP, no SOB. Mild cough  Inpatient Medications    Scheduled Meds: . albuterol  2.5 mg Nebulization Q6H  . arformoterol  15 mcg Nebulization BID  . aspirin EC  325 mg Oral Daily  . budesonide (PULMICORT) nebulizer solution  0.5 mg Nebulization BID  . carvedilol  12.5 mg Oral BID WC  . cefTRIAXone (ROCEPHIN)  IV  2 g Intravenous Q24H  . finasteride  5 mg Oral Daily  . furosemide  40 mg Oral Daily  . oseltamivir  30 mg Oral BID  . predniSONE  50 mg Oral Q breakfast  . rosuvastatin  40 mg Oral Daily  . sodium chloride flush  3 mL Intravenous Q12H   Continuous Infusions:  PRN Meds: acetaminophen **OR** acetaminophen, albuterol, guaiFENesin, ondansetron **OR** ondansetron (ZOFRAN) IV   Vital Signs    Vitals:   03/19/16 2001 03/19/16 2111 03/20/16 0220 03/20/16 0512  BP:  (!) 125/57  (!) 148/71  Pulse:  70  64  Resp:  18  18  Temp:  97.9 F (36.6 C)  98.3 F (36.8 C)  TempSrc:  Oral  Oral  SpO2: 90% (!) 88% 92% 92%  Weight:    215 lb 13.3 oz (97.9 kg)  Height:        Intake/Output Summary (Last 24 hours) at 03/20/16 1059 Last data filed at 03/20/16 0526  Gross per 24 hour  Intake           437.92 ml  Output             2400 ml  Net         -1962.08 ml   Filed Weights   03/18/16 0500 03/19/16 0244 03/20/16 0512  Weight: 211 lb 3.2 oz (95.8 kg) 211 lb (95.7 kg) 215 lb 13.3 oz (97.9 kg)    Telemetry    No adverse rhythms - Personally Reviewed  ECG    NSR, rate 86, RBBB, diffuse nonspecific ST T wave changes.  03/18/2016 - Personally Reviewed  Physical Exam   GEN: No acute distress.   Neck: No JVD Cardiac: RRR, no murmurs, rubs, or gallops.  Respiratory: Clear to auscultation bilaterally. GI: Soft, nontender, non-distended  MS: No edema; No deformity. Neuro:  Nonfocal  Psych: Normal affect   Labs      Chemistry Recent Labs Lab 03/17/16 1315 03/17/16 2138 03/18/16 2352 03/20/16 0520  NA 141 142 140 140  K 4.9 4.3 4.7 4.5  CL 102 103 104 105  CO2 '24 27 26 26  '$ GLUCOSE 118* 115* 127* 148*  BUN 44* 48* 70* 69*  CREATININE 3.24* 2.40* 1.35* 0.99  CALCIUM 9.3 8.7* 8.5* 8.8*  PROT 6.5  --   --   --   ALBUMIN 4.1  --   --   --   AST 63*  --   --   --   ALT 43  --   --   --   ALKPHOS 39  --   --   --   BILITOT 1.0  --   --   --   GFRNONAA 17* 24* 48* >60  GFRAA 19* 28* 56* >60  ANIONGAP '15 12 10 9     '$ Hematology Recent Labs Lab 03/18/16 0530 03/19/16 0837 03/20/16 0520  WBC 12.6* 13.8* 11.3*  RBC 4.48 4.32 4.09*  HGB 13.4 12.9* 12.2*  HCT 42.3 40.5 37.8*  MCV 94.4 93.8 92.4  MCH 29.9 29.9 29.8  MCHC 31.7 31.9 32.3  RDW 14.4 14.1 13.8  PLT 103* 133* 131*    Cardiac Enzymes Recent Labs Lab 03/17/16 1834 03/17/16 2138 03/18/16 0235 03/18/16 0859  TROPONINI 3.98* 4.92* 5.37* 3.05*   No results for input(s): TROPIPOC in the last 168 hours.   BNPNo results for input(s): BNP, PROBNP in the last 168 hours.   DDimer No results for input(s): DDIMER in the last 168 hours.   Radiology    No results found.  Cardiac Studies   ECHO 03/18/16 - Left ventricle: The cavity size was normal. There was mild   concentric hypertrophy. Systolic function was moderately reduced.   The estimated ejection fraction was in the range of 35% to 40%.   Wall motion was normal; there were no regional wall motion   abnormalities. Doppler parameters are consistent with abnormal   left ventricular relaxation (grade 1 diastolic dysfunction).   Doppler parameters are consistent with elevated ventricular   end-diastolic filling pressure. - Mitral valve: There was no regurgitation. - Left atrium: The atrium was normal in size. - Right ventricle: The cavity size was normal. Wall thickness was   normal. Systolic function was moderately reduced. - Right atrium: The atrium was normal in  size. - Tricuspid valve: There was mild regurgitation. - Pulmonic valve: There was no regurgitation. - Pulmonary arteries: Systolic pressure was within the normal   range. - Inferior vena cava: The vessel was normal in size. - Pericardium, extracardiac: There was no pericardial effusion.  Patient Profile     83 y.o. male with flu A, hypoxia, CAD, CABG Trop 4.9  Assessment & Plan    Demand ischemia - no CP, known CAD and ICM, no acute ECG changes. Troponin elevation moderately elevated. Had been on heparin IV for greater than 48 hours. Stopped. No invasive evaluation at this time. In the future, could consider further evaluation but for now, flu tx. His elevated troponin does increases his cardiac mortality. He knows to contact us if symptoms arise.  Agree with Dr. Percival Spanish.   RESPIRATORY FAILURE:  He has chronic lung disease complicated by the flu.  He is responding to therapy per the CCM team.    Ischemic cardiomyopathy - EF stable 35% no changes.  Has follow up in future with Dr. Stanford Breed.  Will sign off. Please call if ?   Signed, Candee Furbish, MD  03/20/2016, 10:59 AM

## 2016-03-20 NOTE — Evaluation (Signed)
Physical Therapy Evaluation Patient Details Name: Joseph Berg MRN: [id number] DOB: 1933-06-16 Today's Date: 03/20/2016   History of Present Illness  83 y.o. male with medical history significant of BPH, pna,CAD status post CABG, COPD, OSA, CHF with an EF of 35%, hyperlipidemia, hypertension, hypoparathyroidism, sciatica presenting w/ progressive dry cough and shortness of breath. Diagnosed with influenza and started on Tamiflu.  Clinical Impression   Pt admitted with above diagnosis. Pt currently with functional limitations due to the deficits listed below (see PT Problem List). Presents to PT with decr activity tolerance, decr balance, decr functional mobility; Patient requires supplemental oxygen to maintain oxygen saturations at acceptable, safe levels with physical activity.  Pt will benefit from skilled PT to increase their independence and safety with mobility to allow discharge to the venue listed below.  Will plan for an O2 qualifying walk next session.       Follow Up Recommendations Home health PT;Supervision/Assistance - 24 hour    Equipment Recommendations  Rolling walker with 5" wheels;3in1 (PT) (supplemental O2)    Recommendations for Other Services OT consult     Precautions / Restrictions Precautions Precautions: Fall;Other (comment) Precaution Comments: Desats with activity  Restrictions Weight Bearing Restrictions: No      Mobility  Bed Mobility Overal bed mobility: Needs Assistance Bed Mobility: Supine to Sit     Supine to sit: Min assist     General bed mobility comments: Min handheld assist to pull to sit  Transfers Overall transfer level: Needs assistance Equipment used: None (tended to hold Dinamap) Transfers: Sit to/from Stand Sit to Stand: Min guard         General transfer comment: No gross difficulty comng to stand, however he did sit back down to the bed relatively quickly after the first time he  stood  Ambulation/Gait Ambulation/Gait assistance: Min guard Ambulation Distance (Feet): 15 Feet Assistive device:  (pusing Dinamap) Gait Pattern/deviations: Decreased step length - right;Decreased step length - left;Shuffle   Gait velocity interpretation: Below normal speed for age/gender General Gait Details: Dependent on UE support on Dinamap during n room walk; cues to self-monitor for activity tolerance; very fatigued, and needed supplemental O2 to maintian O2 sats at acceptable levels during activity  Stairs            Wheelchair Mobility    Modified Rankin (Stroke Patients Only)       Balance Overall balance assessment: Needs assistance           Standing balance-Leahy Scale: Fair                               Pertinent Vitals/Pain Pain Assessment: No/denies pain    Home Living Family/patient expects to be discharged to:: Private residence Living Arrangements: Spouse/significant other Available Help at Discharge: Family;Available 24 hours/day Type of Home: House Home Access: Level entry     Home Layout: One level        Prior Function Level of Independence: Independent         Comments: Retired Development worker, [employer]        Extremity/Trunk Assessment   Upper Extremity Assessment Upper Extremity Assessment: Overall WFL for tasks assessed    Lower Extremity Assessment Lower Extremity Assessment: Generalized weakness       Communication   Communication: No difficulties  Cognition Arousal/Alertness: Awake/alert Behavior During Therapy: WFL for tasks assessed/performed Overall Cognitive Status: Within Functional Limits for  tasks assessed                      General Comments General comments (skin integrity, edema, etc.): Briefly had pt on Room Air and his O2 sats decr to 85% sitting EOB (RN and RT notified); remainder of session conducted on 2 L O2, and O2 sats ranged 88-93%    Exercises      Assessment/Plan    PT Assessment Patient needs continued PT services  PT Problem List Decreased strength;Decreased activity tolerance;Decreased balance;Decreased mobility;Decreased knowledge of use of DME;Decreased knowledge of precautions;Cardiopulmonary status limiting activity          PT Treatment Interventions DME instruction;Gait training;Functional mobility training;Therapeutic activities;Therapeutic exercise;Balance training;Patient/family education    PT Goals (Current goals can be found in the Care Plan section)  Acute Rehab PT Goals Patient Stated Goal: Hopes to be home soon PT Goal Formulation: With patient Time For Goal Achievement: 03/27/16 Potential to Achieve Goals: Good    Frequency Min 3X/week   Barriers to discharge        Co-evaluation               End of Session Equipment Utilized During Treatment: Gait belt Activity Tolerance: Patient limited by fatigue Patient left: in chair;with call bell/phone within reach;with family/visitor present Nurse Communication: Mobility status         Time: 0915-0940 PT Time Calculation (min) (ACUTE ONLY): 25 min   Charges:   PT Evaluation $PT Eval Moderate Complexity: 1 Procedure PT Treatments $Gait Training: 8-22 mins   PT G Codes:        Colletta Maryland 03/20/2016, 10:21 AM  Roney Marion, [place] Pager [phone] Office [phone]

## 2016-03-21 LAB — CBC
HCT: 36.3 % — ABNORMAL LOW (ref 39.0–52.0)
Hemoglobin: 11.9 g/dL — ABNORMAL LOW (ref 13.0–17.0)
MCH: 29.9 pg (ref 26.0–34.0)
MCHC: 32.8 g/dL (ref 30.0–36.0)
MCV: 91.2 fL (ref 78.0–100.0)
PLATELETS: 129 10*3/uL — AB (ref 150–400)
RBC: 3.98 MIL/uL — AB (ref 4.22–5.81)
RDW: 13.7 % (ref 11.5–15.5)
WBC: 11.9 10*3/uL — AB (ref 4.0–10.5)

## 2016-03-21 LAB — GLUCOSE, CAPILLARY
GLUCOSE-CAPILLARY: 150 mg/dL — AB (ref 65–99)
Glucose-Capillary: 118 mg/dL — ABNORMAL HIGH (ref 65–99)

## 2016-03-21 LAB — PROCALCITONIN: Procalcitonin: 0.21 ng/mL

## 2016-03-21 MED ORDER — PREDNISONE 20 MG PO TABS: ORAL_TABLET | ORAL | 0 refills | Status: DC

## 2016-03-21 MED ORDER — OSELTAMIVIR PHOSPHATE 30 MG PO CAPS: 30.0000 mg | ORAL_CAPSULE | Freq: Two times a day (BID) | ORAL | 0 refills | Status: DC

## 2016-03-21 NOTE — Progress Notes (Signed)
Physical Therapy Treatment Patient Details Name: PAULANTHONY GLEAVES MRN: [id number] DOB: 10-May-1933 Today's Date: 03/21/2016    History of Present Illness 83 y.o. male with medical history significant of BPH, pna,CAD status post CABG, COPD, OSA, CHF with an EF of 35%, hyperlipidemia, hypertension, hypoparathyroidism, sciatica presenting w/ progressive dry cough and shortness of breath. Diagnosed with influenza and started on Tamiflu.    PT Comments    Session focused on gait and activity tolerance; Desatted with amb (see other note of this date for oxygen qualifying walk); have updated equip recommendations to include home O2, and notified Case Mgr  Follow Up Recommendations  Home health PT;Supervision/Assistance - 24 hour [facility])     Equipment Recommendations   (supplemental O2)    Recommendations for Other Services       Precautions / Restrictions Precautions Precautions: Fall;Other (comment) Precaution Comments: Desats with activity     Mobility  Bed Mobility Overal bed mobility: Needs Assistance Bed Mobility: Supine to Sit     Supine to sit: Min guard     General bed mobility comments: Cues for technique; needing less physical assist, used bed rails  Transfers Overall transfer level: Needs assistance Equipment used: Rolling walker (2 wheeled) Transfers: Sit to/from Stand Sit to Stand: Min guard         General transfer comment: No gross difficulty comng to stand  Ambulation/Gait Ambulation/Gait assistance: Min guard Ambulation Distance (Feet): 60 Feet Assistive device: Rolling walker (2 wheeled) Gait Pattern/deviations: Decreased step length - right;Decreased step length - left;Shuffle Gait velocity: slowed   General Gait Details: Cues for proper use of RW, and for deep breathing   Stairs            Wheelchair Mobility    Modified Rankin (Stroke Patients Only)       Balance             Standing balance-Leahy Scale: Fair                      Cognition Arousal/Alertness: Awake/alert Behavior During Therapy: WFL for tasks assessed/performed Overall Cognitive Status: Within Functional Limits for tasks assessed                      Exercises      General Comments        Pertinent Vitals/Pain Pain Assessment: No/denies pain    Home Living                      Prior Function            PT Goals (current goals can now be found in the care plan section) Acute Rehab PT Goals Patient Stated Goal: Hopes to be home soon PT Goal Formulation: With patient Time For Goal Achievement: 03/27/16 Potential to Achieve Goals: Good Progress towards PT goals: Progressing toward goals    Frequency    Min 3X/week      PT Plan Current plan remains appropriate;Equipment recommendations need to be updated    Co-evaluation             End of Session Equipment Utilized During Treatment: Gait belt;Oxygen Activity Tolerance: Patient tolerated treatment well Patient left: in bed;with call bell/phone within reach;with family/visitor present     Time: 5284-1324 PT Time Calculation (min) (ACUTE ONLY): 35 min  Charges:  $Gait Training: 23-37 mins  G Codes:      Colletta Maryland 04/07/2016, 1:22 PM  Roney Marion, [place] Pager [phone] Office [phone]

## 2016-03-21 NOTE — Progress Notes (Signed)
Physical Therapy Treatment Note  Clinical Impression:  O2 sat qualifying walk done, with results as follows:  SATURATION QUALIFICATIONS: (This note is used to comply with regulatory documentation for home oxygen)  Patient Saturations on Room Air at Rest = 90%  Patient Saturations on Room Air while Ambulating = 85%  Patient Saturations on 3-4 Liters of oxygen while Ambulating = 93%  Please briefly explain why patient needs home oxygen: Patient requires supplemental oxygen to maintain oxygen saturations at acceptable, safe levels with physical activity.   Full PT note to follow;   Roney Marion, [place] Pager [phone] Office [phone]

## 2016-03-21 NOTE — Care Management Note (Signed)
Case Management Note  Patient Details  Name: Joseph Berg MRN: [id number] Date of Birth: October 09, 1933  Subjective/Objective:                  Pt with hx of atrial fibrillation, systolic heart failure and COPD admitted on 2/8 with increasing dyspnea, cough.  He was found to have acute kidney injury, acute respiratory failure with hypoxemia and influenza A.   Action/Plan: Plan is to d/c to home today with home health services ( RN,PT) and home oxygen.  Expected Discharge Date:  03/21/16               Expected Discharge Plan:  [place]  In-House Referral:     Discharge planning Services  CM Consult  Post Acute Care Choice:    Choice offered to:  Patient  DME Arranged:  Oxygen DME Agency:  [employer] referral made to [facility] @ [phone]   HH Arranged:  PT, RN [facility] Agency:  [employer]/ referral made to [facility] @ [phone]  Status of Service:  Completed, signed off  If discussed at [place] of Stay Meetings, dates discussed:    Additional Comments:  Sharin Mons, RN 03/21/2016, 11:51 AM

## 2016-03-21 NOTE — Discharge Summary (Signed)
Physician Discharge Summary  Joseph Berg HGD:[id number] DOB: 1933-03-22 DOA: 03/17/2016  PCP: Joseph Lecher, MD  Admit date: 03/17/2016 Discharge date: 03/21/2016  Time spent:  62mnutes  Recommendations for Outpatient Follow-up:  1. PCP in 1 week 2. [facility] cardiology in 3-4weeks  Discharge Diagnoses:    Acute respiratory failure with hypoxemia ([facility]   Influenza A   Acute respiratory distress   NSTEMI    HYPERTENSION, BENIGN ESSENTIAL   BPH (benign prostatic hyperplasia)   CAD (coronary artery disease) of artery bypass graft   Chronic systolic congestive heart failure (HCC)   S/P CABG x 2   AKI (acute kidney injury) ([facility]   Other emphysema ([facility]   Acute respiratory failure with hypoxia (HCC)   Hypotension   Hyperglycemia     Discharge Condition: stable Diet recommendation: Low sodium heart healthy  Filed Weights   03/19/16 0244 03/20/16 0512 03/21/16 0111  Weight: 95.7 kg (211 lb) 97.9 kg (215 lb 13.3 oz) 96.6 kg (212 lb 15.4 oz)    History of present illness:  Joseph Berg 83y.o.malewith medical history significant of BPH, CAD status post CABG, CHF with an EF of 35%, hyperlipidemia, hypertension, hypoparathyroidism, sciatica presenting w/ progressive dry cough and shortness of breath  Hospital Course:   Acute Respiratory Failure: -required BIPAP on admission, now off -due to FLu, COPD exacerbation, NSTEMI -improved with Tamiflu, ceftriaxone, solumedrol, nebs -O2 sats dropped with ambulation and discharged home on Home O2, prednisone taper, tamiflu  NSTEMI vs Demand ischemia -h/o CAD s/p CABG -Troponin trended upto 5.3 -Cards consulted treated with IV heparin, ASA, Coreg -FU ECHO with EF 35-40% from 40-45% and diffuse hypokinesis -no further cardiac workup recommended at this time per Cards, advised FU with Joseph Berg  COPD exacerbation due to FLu -as above  Acute kidney injury: Creatinine 3.24. Baseline 0.9.  -improved with  hydration and holding ARB -ARB resumed at discharge  Hypotension: Likely secondary to continuation of home blood pressure medications and diuretics in the setting of little to no fluid intake for 3 days. -resolved - resumed lasix, ARB and coreg  ? Dysphagia -s/p SLP eval  -only mild asp risk, started on regular diet  Hyperglycemia: -no h/o DM -from steroids, HbA1c 5.9  Chronic systolic congestive heart failure: Last echo from 2015 showing EF of 496%and mild diastolic dysfunction.  -repeat ECHO with midl drop in ECHO -seen by cards, no workup at this time -resumed PO lasix -FU with Joseph Berg  BPH:  - continue Proscar  Peripheral neuropathy: - continue neurontin   Consultants:   Cards  Discharge Exam: Vitals:   03/21/16 0424 03/21/16 0625  BP: (!) 166/77 (!) 146/63  Pulse: 63 64  Resp: 18   Temp: 97.9 F (36.6 C)     General: AAOx3 Cardiovascular: S1S2/RRR Respiratory: Improved air movement  Discharge Instructions   Discharge Instructions    Diet - low sodium heart healthy    Complete by:  As directed    Increase activity slowly    Complete by:  As directed      Current Discharge Medication List    START taking these medications   Details  predniSONE (DELTASONE) 20 MG tablet Take '40mg'$  for 2days then '20mg'$  for 2days then '10mg'$  for 2days then STOP Qty: 8 tablet, Refills: 0      CONTINUE these medications which have CHANGED   Details  oseltamivir (TAMIFLU) 30 MG capsule Take 1 capsule (30 mg total) by mouth 2 (two) times daily. For 2days Qty:  4 capsule, Refills: 0      CONTINUE these medications which have NOT CHANGED   Details  acetaminophen (TYLENOL) 500 MG tablet Take 1-2 tablets (500-1,000 mg total) by mouth every 6 (six) hours as needed for pain. Qty: 30 tablet, Refills: 0    Ascorbic Acid (VITAMIN C) 1000 MG tablet Take 1,000 mg by mouth daily.    aspirin 81 MG tablet Take 81 mg by mouth daily.    carvedilol (COREG) 12.5 MG  tablet TAKE 1 TABLET (12.5 MG TOTAL) BY MOUTH 2 (TWO) TIMES DAILY. Qty: 180 tablet, Refills: 3    cholecalciferol (VITAMIN D) 1000 UNITS tablet Take 3,000 Units by mouth daily.     Cyanocobalamin (VITAMIN B-12 PO) Take 1 tablet by mouth daily.    finasteride (PROSCAR) 5 MG tablet TAKE ONE TABLET BY MOUTH ONE TIME DAILY Qty: 90 tablet, Refills: 1    furosemide (LASIX) 40 MG tablet TAKE ONE TABLET BY MOUTH ONE TIME DAILY Qty: 90 tablet, Refills: 3    ipratropium-albuterol (DUONEB) 0.5-2.5 (3) MG/3ML SOLN Take 3 mLs by nebulization every 2 (two) hours as needed (wheeze, SOB). Qty: 60 mL, Refills: 3   Associated Diagnoses: COPD exacerbation (HCC)    losartan (COZAAR) 50 MG tablet TAKE ONE TABLET BY MOUTH ONE TIME DAILY Qty: 30 tablet, Refills: 10    Multiple Vitamin (MULTIVITAMIN) capsule Take 1 capsule by mouth daily.    Nebulizer MISC Generic nebulizer plus supplies Qty: 1 each, Refills: 0   Associated Diagnoses: COPD exacerbation (HCC)    ondansetron (ZOFRAN ODT) 4 MG disintegrating tablet Take one tab by mouth Q6hr prn nausea.  Dissolve under tongue. Qty: 12 tablet, Refills: 0    rosuvastatin (CRESTOR) 40 MG tablet TAKE 1 TABLET BY MOUTH EVERY DAY Qty: 30 tablet, Refills: 11    SYMBICORT 160-4.5 MCG/ACT inhaler INHALE TWO PUFFS BY MOUTH TWICE DAILY Qty: 10.2 Inhaler, Refills: 6      STOP taking these medications     gabapentin (NEURONTIN) 600 MG tablet        Allergies  Allergen Reactions  . Hctz [Hydrochlorothiazide] Other (See Comments)    Affected renal function.       The results of significant diagnostics from this hospitalization (including imaging, microbiology, ancillary and laboratory) are listed below for reference.    Significant Diagnostic Studies: Dg Chest 2 View  Result Date: 03/17/2016 CLINICAL DATA:  Shortness of breath, nausea and vomiting cough for the past day. History of CAD, CHF an pneumonia. Former smoker. EXAM: CHEST  2 VIEW COMPARISON:   03/26/2015; 12/13/2013 FINDINGS: Grossly unchanged enlarged cardiac silhouette and mediastinal contours post median sternotomy and CABG. There is unchanged tortuosity and potential ectasia of the thoracic aorta. Atherosclerotic plaque within the thoracic aorta. The lungs remain hyperexpanded with flattening of the diaphragms. Veiling opacities overlying the bilateral lower lungs are unchanged in favored to represent overlying soft tissues. Unchanged pleuroparenchymal thickening about the bilateral major and the right minor fissures. No evidence of edema. No acute osseus abnormalities. Old/ healed fractures involving the posterolateral aspects of the left fourth and fifth ribs. Stigmata DISH within thoracic spine. IMPRESSION: 1. Similar findings of cardiomegaly, lung hyperexpansion and bronchitic change without superimposed acute cardiopulmonary disease. 2.  Aortic Atherosclerosis (ICD10-170.0) 3. Old left-sided rib fractures. Electronically Signed   By: Sandi Mariscal M.D.   On: 03/17/2016 13:52   Dg Chest Port 1 View  Result Date: 03/17/2016 CLINICAL DATA:  Acute respiratory distress EXAM: PORTABLE CHEST 1 VIEW COMPARISON:  1318  hours on the same day FINDINGS: The patient is status post CABG. There is aortic atherosclerosis without aneurysm. Patchy airspace opacities at each lung base suspicious for pneumonia and/or atelectasis. Mild vascular congestion is seen. Trace left effusion. No pneumothorax or acute osseous abnormality. Chronic left fifth rib fracture identified on current exam. IMPRESSION: Patchy airspace opacities at the lung bases are suspicious for pneumonia and/or atelectasis. Mild CHF with trace left effusion. Electronically Signed   By: Ashley Royalty M.D.   On: 03/17/2016 20:50    Microbiology: Recent Results (from the past 240 hour(s))  Respiratory Panel by PCR     Status: Abnormal   Collection Time: 03/17/16  6:10 PM  Result Value Ref Range Status   Adenovirus NOT DETECTED NOT DETECTED Final    Coronavirus 229E NOT DETECTED NOT DETECTED Final   Coronavirus HKU1 NOT DETECTED NOT DETECTED Final   Coronavirus NL63 NOT DETECTED NOT DETECTED Final   Coronavirus OC43 NOT DETECTED NOT DETECTED Final   Metapneumovirus NOT DETECTED NOT DETECTED Final   Rhinovirus / Enterovirus NOT DETECTED NOT DETECTED Final   Influenza A H3 DETECTED (A) NOT DETECTED Final   Influenza B NOT DETECTED NOT DETECTED Final   Parainfluenza Virus 1 NOT DETECTED NOT DETECTED Final   Parainfluenza Virus 2 NOT DETECTED NOT DETECTED Final   Parainfluenza Virus 3 NOT DETECTED NOT DETECTED Final   Parainfluenza Virus 4 NOT DETECTED NOT DETECTED Final   Respiratory Syncytial Virus NOT DETECTED NOT DETECTED Final   Bordetella pertussis NOT DETECTED NOT DETECTED Final   Chlamydophila pneumoniae NOT DETECTED NOT DETECTED Final   Mycoplasma pneumoniae NOT DETECTED NOT DETECTED Final  MRSA PCR Screening     Status: None   Collection Time: 03/17/16  9:31 PM  Result Value Ref Range Status   MRSA by PCR NEGATIVE NEGATIVE Final    Comment:        The GeneXpert MRSA Assay (FDA approved for NASAL specimens only), is one component of a comprehensive MRSA colonization surveillance program. It is not intended to diagnose MRSA infection nor to guide or monitor treatment for MRSA infections.      Labs: Basic Metabolic Panel:  Recent Labs Lab 03/17/16 1315 03/17/16 2138 03/18/16 2352 03/20/16 0520  NA 141 142 140 140  K 4.9 4.3 4.7 4.5  CL 102 103 104 105  CO2 '24 27 26 26  '$ GLUCOSE 118* 115* 127* 148*  BUN 44* 48* 70* 69*  CREATININE 3.24* 2.40* 1.35* 0.99  CALCIUM 9.3 8.7* 8.5* 8.8*   Liver Function Tests:  Recent Labs Lab 03/17/16 1315  AST 63*  ALT 43  ALKPHOS 39  BILITOT 1.0  PROT 6.5  ALBUMIN 4.1   No results for input(s): LIPASE, AMYLASE in the last 168 hours. No results for input(s): AMMONIA in the last 168 hours. CBC:  Recent Labs Lab 03/17/16 1315 03/17/16 2138 03/18/16 0530  03/19/16 0837 03/20/16 0520 03/21/16 0547  WBC 10.5 10.3 12.6* 13.8* 11.3* 11.9*  NEUTROABS 8.4*  --   --   --   --   --   HGB 15.3 14.1 13.4 12.9* 12.2* 11.9*  HCT 47.4 44.5 42.3 40.5 37.8* 36.3*  MCV 94.0 94.1 94.4 93.8 92.4 91.2  PLT 116* 104* 103* 133* 131* 129*   Cardiac Enzymes:  Recent Labs Lab 03/17/16 1834 03/17/16 2138 03/18/16 0235 03/18/16 0859  TROPONINI 3.98* 4.92* 5.37* 3.05*   BNP: BNP (last 3 results) No results for input(s): BNP in the last 8760 hours.  ProBNP (  last 3 results) No results for input(s): PROBNP in the last 8760 hours.  CBG:  Recent Labs Lab 03/20/16 1717 03/20/16 2043 03/21/16 0002 03/21/16 0425 03/21/16 0804  GLUCAP 136* 193* 148* 150* 118*       SignedDomenic Polite MD.  [facility] 03/21/2016, 10:03 AM

## 2016-03-21 NOTE — Progress Notes (Signed)
Joseph Berg discharged Home with wife and H/H RN/PT per MD order.  Discharge instructions reviewed and discussed with the patient, all questions and concerns answered. Copy of instructions, care notes for new diagnosis and scripts given to patient.  Allergies as of 03/21/2016      Reactions   Hctz [hydrochlorothiazide] Other (See Comments)   Affected renal function.       Medication List    STOP taking these medications   gabapentin 600 MG tablet Commonly known as:  NEURONTIN     TAKE these medications   acetaminophen 500 MG tablet Commonly known as:  TYLENOL Take 1-2 tablets (500-1,000 mg total) by mouth every 6 (six) hours as needed for pain.   aspirin 81 MG tablet Take 81 mg by mouth daily.   carvedilol 12.5 MG tablet Commonly known as:  COREG TAKE 1 TABLET (12.5 MG TOTAL) BY MOUTH 2 (TWO) TIMES DAILY.   cholecalciferol 1000 units tablet Commonly known as:  VITAMIN D Take 3,000 Units by mouth daily.   finasteride 5 MG tablet Commonly known as:  PROSCAR TAKE ONE TABLET BY MOUTH ONE TIME DAILY   furosemide 40 MG tablet Commonly known as:  LASIX TAKE ONE TABLET BY MOUTH ONE TIME DAILY   ipratropium-albuterol 0.5-2.5 (3) MG/3ML Soln Commonly known as:  DUONEB Take 3 mLs by nebulization every 2 (two) hours as needed (wheeze, SOB).   losartan 50 MG tablet Commonly known as:  COZAAR TAKE ONE TABLET BY MOUTH ONE TIME DAILY   multivitamin capsule Take 1 capsule by mouth daily.   Nebulizer Misc Generic nebulizer plus supplies   ondansetron 4 MG disintegrating tablet Commonly known as:  ZOFRAN ODT Take one tab by mouth Q6hr prn nausea.  Dissolve under tongue.   oseltamivir 30 MG capsule Commonly known as:  TAMIFLU Take 1 capsule (30 mg total) by mouth 2 (two) times daily. For 2days What changed:  medication strength  how much to take  when to take this  additional instructions   predniSONE 20 MG tablet Commonly known as:  DELTASONE Take '40mg'$  for  2days then '20mg'$  for 2days then '10mg'$  for 2days then STOP   rosuvastatin 40 MG tablet Commonly known as:  CRESTOR TAKE 1 TABLET BY MOUTH EVERY DAY   SYMBICORT 160-4.5 MCG/ACT inhaler Generic drug:  budesonide-formoterol INHALE TWO PUFFS BY MOUTH TWICE DAILY   VITAMIN B-12 PO Take 1 tablet by mouth daily.   vitamin C 1000 MG tablet Take 1,000 mg by mouth daily.            Durable Medical Equipment        Start     Ordered   03/21/16 1154  For home use only DME oxygen  Once    Question Answer Comment  Mode or (Route) Nasal cannula   Liters per Minute 2   Frequency Continuous (stationary and portable oxygen unit needed)   Oxygen conserving device No   Oxygen delivery system Gas      03/21/16 1153      Patients skin is clean, dry and intact, no evidence of skin break down. IV site discontinued and catheter remains intact. Site without signs and symptoms of complications. Dressing and pressure applied.  Patient escorted to car by NT in a wheelchair with O2,  no distress noted upon discharge.  Wynetta Emery, Natalyn Szymanowski C 03/21/2016 2:42 PM

## 2016-03-22 DIAGNOSIS — G609 Hereditary and idiopathic neuropathy, unspecified: Secondary | ICD-10-CM | POA: Diagnosis not present

## 2016-03-22 DIAGNOSIS — E785 Hyperlipidemia, unspecified: Secondary | ICD-10-CM | POA: Diagnosis not present

## 2016-03-22 DIAGNOSIS — Z951 Presence of aortocoronary bypass graft: Secondary | ICD-10-CM | POA: Diagnosis not present

## 2016-03-22 DIAGNOSIS — I110 Hypertensive heart disease with heart failure: Secondary | ICD-10-CM | POA: Diagnosis not present

## 2016-03-22 DIAGNOSIS — J439 Emphysema, unspecified: Secondary | ICD-10-CM | POA: Diagnosis not present

## 2016-03-22 DIAGNOSIS — I5020 Unspecified systolic (congestive) heart failure: Secondary | ICD-10-CM | POA: Diagnosis not present

## 2016-03-22 DIAGNOSIS — I214 Non-ST elevation (NSTEMI) myocardial infarction: Secondary | ICD-10-CM | POA: Diagnosis not present

## 2016-03-22 DIAGNOSIS — G4733 Obstructive sleep apnea (adult) (pediatric): Secondary | ICD-10-CM | POA: Diagnosis not present

## 2016-03-22 DIAGNOSIS — I2510 Atherosclerotic heart disease of native coronary artery without angina pectoris: Secondary | ICD-10-CM | POA: Diagnosis not present

## 2016-03-24 DIAGNOSIS — I214 Non-ST elevation (NSTEMI) myocardial infarction: Secondary | ICD-10-CM | POA: Diagnosis not present

## 2016-03-24 DIAGNOSIS — G609 Hereditary and idiopathic neuropathy, unspecified: Secondary | ICD-10-CM | POA: Diagnosis not present

## 2016-03-24 DIAGNOSIS — E785 Hyperlipidemia, unspecified: Secondary | ICD-10-CM | POA: Diagnosis not present

## 2016-03-24 DIAGNOSIS — I2510 Atherosclerotic heart disease of native coronary artery without angina pectoris: Secondary | ICD-10-CM | POA: Diagnosis not present

## 2016-03-24 DIAGNOSIS — I5020 Unspecified systolic (congestive) heart failure: Secondary | ICD-10-CM | POA: Diagnosis not present

## 2016-03-24 DIAGNOSIS — G4733 Obstructive sleep apnea (adult) (pediatric): Secondary | ICD-10-CM | POA: Diagnosis not present

## 2016-03-24 DIAGNOSIS — Z951 Presence of aortocoronary bypass graft: Secondary | ICD-10-CM | POA: Diagnosis not present

## 2016-03-24 DIAGNOSIS — I110 Hypertensive heart disease with heart failure: Secondary | ICD-10-CM | POA: Diagnosis not present

## 2016-03-24 DIAGNOSIS — J439 Emphysema, unspecified: Secondary | ICD-10-CM | POA: Diagnosis not present

## 2016-03-25 DIAGNOSIS — J439 Emphysema, unspecified: Secondary | ICD-10-CM | POA: Diagnosis not present

## 2016-03-25 DIAGNOSIS — I214 Non-ST elevation (NSTEMI) myocardial infarction: Secondary | ICD-10-CM | POA: Diagnosis not present

## 2016-03-25 DIAGNOSIS — G609 Hereditary and idiopathic neuropathy, unspecified: Secondary | ICD-10-CM | POA: Diagnosis not present

## 2016-03-25 DIAGNOSIS — Z951 Presence of aortocoronary bypass graft: Secondary | ICD-10-CM | POA: Diagnosis not present

## 2016-03-25 DIAGNOSIS — I2510 Atherosclerotic heart disease of native coronary artery without angina pectoris: Secondary | ICD-10-CM | POA: Diagnosis not present

## 2016-03-25 DIAGNOSIS — G4733 Obstructive sleep apnea (adult) (pediatric): Secondary | ICD-10-CM | POA: Diagnosis not present

## 2016-03-25 DIAGNOSIS — I110 Hypertensive heart disease with heart failure: Secondary | ICD-10-CM | POA: Diagnosis not present

## 2016-03-25 DIAGNOSIS — I5020 Unspecified systolic (congestive) heart failure: Secondary | ICD-10-CM | POA: Diagnosis not present

## 2016-03-25 DIAGNOSIS — E785 Hyperlipidemia, unspecified: Secondary | ICD-10-CM | POA: Diagnosis not present

## 2016-03-29 ENCOUNTER — Encounter: Payer: Self-pay | Admitting: Family Medicine

## 2016-03-29 ENCOUNTER — Ambulatory Visit (INDEPENDENT_AMBULATORY_CARE_PROVIDER_SITE_OTHER): Payer: Medicare PPO

## 2016-03-29 ENCOUNTER — Ambulatory Visit (INDEPENDENT_AMBULATORY_CARE_PROVIDER_SITE_OTHER): Payer: Medicare PPO | Admitting: Family Medicine

## 2016-03-29 VITALS — BP 112/55 | HR 79 | Temp 97.8°F | Ht 69.0 in | Wt 213.0 lb

## 2016-03-29 DIAGNOSIS — N179 Acute kidney failure, unspecified: Secondary | ICD-10-CM

## 2016-03-29 DIAGNOSIS — J441 Chronic obstructive pulmonary disease with (acute) exacerbation: Secondary | ICD-10-CM

## 2016-03-29 DIAGNOSIS — J9601 Acute respiratory failure with hypoxia: Secondary | ICD-10-CM

## 2016-03-29 DIAGNOSIS — I25700 Atherosclerosis of coronary artery bypass graft(s), unspecified, with unstable angina pectoris: Secondary | ICD-10-CM

## 2016-03-29 DIAGNOSIS — R0602 Shortness of breath: Secondary | ICD-10-CM | POA: Diagnosis not present

## 2016-03-29 DIAGNOSIS — I5022 Chronic systolic (congestive) heart failure: Secondary | ICD-10-CM | POA: Diagnosis not present

## 2016-03-29 DIAGNOSIS — R05 Cough: Secondary | ICD-10-CM | POA: Diagnosis not present

## 2016-03-29 MED ORDER — AMBULATORY NON FORMULARY MEDICATION: 0 refills | Status: DC

## 2016-03-29 NOTE — Progress Notes (Signed)
Subjective:    Patient ID: Joseph Berg, male    DOB: 1933/12/24, 83 y.o.   MRN: [id number]  HPI 46-year-old male here today for follow-up hospital visit. He actually went to urgent care on every seventh and was diagnosed with influenza. Implant of extreme fatigue myalgias nausea chills and an episode of vomiting he also had a brief episode of chest pain. His oxygen level was 84% at that time. He also had EKG changes at the time it was recommended that he go to the emergency department. He is admitted to the hospital on February 8 and discharged home on February 12. He was diagnosed with influenza A. He had acute respiratory failure and required BiPAP on admission. He also had a non-ST elevation MI. He improved on Tamiflu ceftriaxone Solu-Medrol and frequent nebulizer treatments. History phone and went up to 5.3 and cardiology was consult it. He was started on IV heparin, aspirin and Coreg. He had a follow-up echocardiogram showing EF of 35-40% which had dropped and also diffuse hypokinesis. They recommended that he follow with cardiology as an outpatient. He actually quit using a fat-containing couple days ago because she's been able to maintain his pulse ox in the low 90s. He has 1 more day left on his prednisone taper. He still feels like he has a lot of phlegm in his chest just can't get it out. He reports she's been eating and drinking well since he sick at home.   Review of Systems   BP (!) 112/55   Pulse 79   Temp 97.8 F (36.6 C)   Ht '5\' 9"'$  (1.753 m)   Wt 213 lb (96.6 kg)   SpO2 95%   BMI 31.45 kg/m     Allergies  Allergen Reactions  . Hctz [Hydrochlorothiazide] Other (See Comments)    Affected renal function.     Past Medical History:  Diagnosis Date  . BPH (benign prostatic hyperplasia)   . CAD (coronary artery disease)    a. s/p CABG x1 with LIMA-LAD 1994. b. LM & 3v CAD by cath 07/2012  . CHF (congestive heart failure) ([place])    a. EF 35-40% by echo 07/2012.  [name]  Complication of anesthesia    affected patients memory. Pt stated "I couldn't remember anything for three months...just bits and pieces"  . Emphysema    a. Moderate emphysema by CT 06/2012.  [name] Heart attack 1994  . Hyperlipidemia   . Hypertension   . Hypoparathyroidism ([place])   . NSVT (nonsustained ventricular tachycardia) ([place])    a. Seen on tele 07/2012.  . OSA on CPAP    pt stated he does not wear CPAP because he no longer has sleep apnea  . Pneumonia   . Pseudoaneurysm of left femoral artery ([place])    a. post cath s/p compression 07/2012, associated w/ anemia.  . Sciatica 2012   Qualifier: Diagnosis of  By: Madilyn Fireman MD, Barnetta Chapel      Past Surgical History:  Procedure Laterality Date  . CATARACT EXTRACTION, BILATERAL    . COLONOSCOPY    . CORONARY ARTERY BYPASS GRAFT  03-23-92  . CORONARY ARTERY BYPASS GRAFT N/A 08/14/2012   Procedure: REDO CORONARY ARTERY BYPASS GRAFTING (CABG);  Surgeon: Gaye Pollack, MD;  Location: [facility];  Service: Open Heart Surgery;  Laterality: N/A;  . INTRAOPERATIVE TRANSESOPHAGEAL ECHOCARDIOGRAM N/A 08/14/2012   Procedure: INTRAOPERATIVE TRANSESOPHAGEAL ECHOCARDIOGRAM;  Surgeon: Gaye Pollack, MD;  Location: [place] [facility];  Service: Open Heart Surgery;  Laterality: N/A;  .  LUMBAR LAMINECTOMY/DECOMPRESSION MICRODISCECTOMY Left 02/18/2015   Procedure: LUMBAR LAMINECTOMY/DECOMPRESSION MICRODISCECTOMY- LEFT FOUR-FIVE ;  Surgeon: Ashok Pall, MD;  Location: [facility];  Service: Neurosurgery;  Laterality: Left;    Social History   Social History  . Marital status: Married    Spouse name: N/A  . Number of children: N/A  . Years of education: N/A   Occupational History  . Not on file.   Social History Main Topics  . Smoking status: Former Smoker    Packs/day: 1.00    Years: 60.00    Quit date: 10/08/2010  . Smokeless tobacco: Never Used  . Alcohol use Yes     Comment: Occasional  . Drug use: No  . Sexual activity: Not on file     Comment: works part-time,  [employer] completed H, married, no children, regular exercise.S   Other Topics Concern  . Not on file   Social History Narrative  . No narrative on file    Family History  Problem Relation Age of Onset  . Heart attack Father 67  . Stroke Brother 68  . Hypertension Brother   . Hyperlipidemia Brother     Outpatient Encounter Prescriptions as of 03/29/2016  Medication Sig  . acetaminophen (TYLENOL) 500 MG tablet Take 1-2 tablets (500-1,000 mg total) by mouth every 6 (six) hours as needed for pain.  . Ascorbic Acid (VITAMIN C) 1000 MG tablet Take 1,000 mg by mouth daily.  [name] aspirin 81 MG tablet Take 81 mg by mouth daily.  . carvedilol (COREG) 12.5 MG tablet TAKE 1 TABLET (12.5 MG TOTAL) BY MOUTH 2 (TWO) TIMES DAILY.  [name] Cholecalciferol (VITAMIN D PO) Take 3,000 mg by mouth daily.  . Cyanocobalamin (VITAMIN B-12 PO) Take 1 tablet by mouth daily.  . finasteride (PROSCAR) 5 MG tablet TAKE ONE TABLET BY MOUTH ONE TIME DAILY  . furosemide (LASIX) 40 MG tablet TAKE ONE TABLET BY MOUTH ONE TIME DAILY  . ipratropium-albuterol (DUONEB) 0.5-2.5 (3) MG/3ML SOLN Take 3 mLs by nebulization every 2 (two) hours as needed (wheeze, SOB).  [name] losartan (COZAAR) 50 MG tablet TAKE ONE TABLET BY MOUTH ONE TIME DAILY  . Multiple Vitamin (MULTIVITAMIN) capsule Take 1 capsule by mouth daily.  . Nebulizer MISC Generic nebulizer plus supplies  . ondansetron (ZOFRAN ODT) 4 MG disintegrating tablet Take one tab by mouth Q6hr prn nausea.  Dissolve under tongue.  . predniSONE (DELTASONE) 20 MG tablet Take '40mg'$  for 2days then '20mg'$  for 2days then '10mg'$  for 2days then STOP  . rosuvastatin (CRESTOR) 40 MG tablet TAKE 1 TABLET BY MOUTH EVERY DAY  . SYMBICORT 160-4.5 MCG/ACT inhaler INHALE TWO PUFFS BY MOUTH TWICE DAILY  . AMBULATORY NON FORMULARY MEDICATION Medication Name: Flutter valve. Dx COPD.  . [DISCONTINUED] cholecalciferol (VITAMIN D) 1000 UNITS tablet Take 3,000 Units by mouth daily.   . [DISCONTINUED]  oseltamivir (TAMIFLU) 30 MG capsule Take 1 capsule (30 mg total) by mouth 2 (two) times daily. For 2days   No facility-administered encounter medications on file as of 03/29/2016.          Objective:   Physical Exam  Constitutional: He is oriented to person, place, and time. He appears well-developed and well-nourished.  HENT:  Head: Normocephalic and atraumatic.  Right Ear: External ear normal.  Left Ear: External ear normal.  Nose: Nose normal.  Mouth/Throat: Oropharynx is clear and moist.  TMs and canals are clear.   Eyes: Conjunctivae and EOM are normal. Pupils are equal, round, and reactive to  light.  Neck: Neck supple. No thyromegaly present.  Cardiovascular: Normal rate and normal heart sounds.   Pulmonary/Chest: Effort normal and breath sounds normal.  Lymphadenopathy:    He has no cervical adenopathy.  Neurological: He is alert and oriented to person, place, and time.  Skin: Skin is warm and dry.  Psychiatric: He has a normal mood and affect.        Assessment & Plan:  Acute respiratory failure secondary to influenza and COPD exacerbation- was sent home on oxygen.  He is very short of breath today. The his oxygen is stable at rest. Because of weakness we did not do a walk test. A medical head and get a chest x-ray just to rule out any sign of acute infection or hospital-acquired pneumonia. Like him to get a flutter valve he's able to from the local DME supply store I think this would really help him as well.  Non-ST elevation MI/CAD-has follow-up with cardiology scheduled.   Acute kidney injury-  Due to recheck renal function this week or next.    Hypertension- Well controlled. Continue current regimen. Follow up in  6 months.   Systolic congestive heart failure- Stable. No sign of volume overload but he is still very short of breath today so we'll get chest x-ray.

## 2016-03-30 DIAGNOSIS — J439 Emphysema, unspecified: Secondary | ICD-10-CM | POA: Diagnosis not present

## 2016-03-30 DIAGNOSIS — I5020 Unspecified systolic (congestive) heart failure: Secondary | ICD-10-CM | POA: Diagnosis not present

## 2016-03-30 DIAGNOSIS — Z951 Presence of aortocoronary bypass graft: Secondary | ICD-10-CM | POA: Diagnosis not present

## 2016-03-30 DIAGNOSIS — I2510 Atherosclerotic heart disease of native coronary artery without angina pectoris: Secondary | ICD-10-CM | POA: Diagnosis not present

## 2016-03-30 DIAGNOSIS — I110 Hypertensive heart disease with heart failure: Secondary | ICD-10-CM | POA: Diagnosis not present

## 2016-03-30 DIAGNOSIS — G4733 Obstructive sleep apnea (adult) (pediatric): Secondary | ICD-10-CM | POA: Diagnosis not present

## 2016-03-30 DIAGNOSIS — I214 Non-ST elevation (NSTEMI) myocardial infarction: Secondary | ICD-10-CM | POA: Diagnosis not present

## 2016-03-30 DIAGNOSIS — E785 Hyperlipidemia, unspecified: Secondary | ICD-10-CM | POA: Diagnosis not present

## 2016-03-30 DIAGNOSIS — G609 Hereditary and idiopathic neuropathy, unspecified: Secondary | ICD-10-CM | POA: Diagnosis not present

## 2016-03-31 DIAGNOSIS — I110 Hypertensive heart disease with heart failure: Secondary | ICD-10-CM | POA: Diagnosis not present

## 2016-03-31 DIAGNOSIS — J439 Emphysema, unspecified: Secondary | ICD-10-CM | POA: Diagnosis not present

## 2016-03-31 DIAGNOSIS — G4733 Obstructive sleep apnea (adult) (pediatric): Secondary | ICD-10-CM | POA: Diagnosis not present

## 2016-03-31 DIAGNOSIS — E785 Hyperlipidemia, unspecified: Secondary | ICD-10-CM | POA: Diagnosis not present

## 2016-03-31 DIAGNOSIS — I5020 Unspecified systolic (congestive) heart failure: Secondary | ICD-10-CM | POA: Diagnosis not present

## 2016-03-31 DIAGNOSIS — Z951 Presence of aortocoronary bypass graft: Secondary | ICD-10-CM | POA: Diagnosis not present

## 2016-03-31 DIAGNOSIS — I2510 Atherosclerotic heart disease of native coronary artery without angina pectoris: Secondary | ICD-10-CM | POA: Diagnosis not present

## 2016-03-31 DIAGNOSIS — I214 Non-ST elevation (NSTEMI) myocardial infarction: Secondary | ICD-10-CM | POA: Diagnosis not present

## 2016-03-31 DIAGNOSIS — G609 Hereditary and idiopathic neuropathy, unspecified: Secondary | ICD-10-CM | POA: Diagnosis not present

## 2016-04-01 DIAGNOSIS — Z951 Presence of aortocoronary bypass graft: Secondary | ICD-10-CM | POA: Diagnosis not present

## 2016-04-01 DIAGNOSIS — I5020 Unspecified systolic (congestive) heart failure: Secondary | ICD-10-CM | POA: Diagnosis not present

## 2016-04-01 DIAGNOSIS — E785 Hyperlipidemia, unspecified: Secondary | ICD-10-CM | POA: Diagnosis not present

## 2016-04-01 DIAGNOSIS — G4733 Obstructive sleep apnea (adult) (pediatric): Secondary | ICD-10-CM | POA: Diagnosis not present

## 2016-04-01 DIAGNOSIS — J439 Emphysema, unspecified: Secondary | ICD-10-CM | POA: Diagnosis not present

## 2016-04-01 DIAGNOSIS — G609 Hereditary and idiopathic neuropathy, unspecified: Secondary | ICD-10-CM | POA: Diagnosis not present

## 2016-04-01 DIAGNOSIS — I110 Hypertensive heart disease with heart failure: Secondary | ICD-10-CM | POA: Diagnosis not present

## 2016-04-01 DIAGNOSIS — I2510 Atherosclerotic heart disease of native coronary artery without angina pectoris: Secondary | ICD-10-CM | POA: Diagnosis not present

## 2016-04-01 DIAGNOSIS — I214 Non-ST elevation (NSTEMI) myocardial infarction: Secondary | ICD-10-CM | POA: Diagnosis not present

## 2016-04-06 DIAGNOSIS — Z951 Presence of aortocoronary bypass graft: Secondary | ICD-10-CM | POA: Diagnosis not present

## 2016-04-06 DIAGNOSIS — G609 Hereditary and idiopathic neuropathy, unspecified: Secondary | ICD-10-CM | POA: Diagnosis not present

## 2016-04-06 DIAGNOSIS — G4733 Obstructive sleep apnea (adult) (pediatric): Secondary | ICD-10-CM | POA: Diagnosis not present

## 2016-04-06 DIAGNOSIS — E785 Hyperlipidemia, unspecified: Secondary | ICD-10-CM | POA: Diagnosis not present

## 2016-04-06 DIAGNOSIS — J439 Emphysema, unspecified: Secondary | ICD-10-CM | POA: Diagnosis not present

## 2016-04-06 DIAGNOSIS — I214 Non-ST elevation (NSTEMI) myocardial infarction: Secondary | ICD-10-CM | POA: Diagnosis not present

## 2016-04-06 DIAGNOSIS — I110 Hypertensive heart disease with heart failure: Secondary | ICD-10-CM | POA: Diagnosis not present

## 2016-04-06 DIAGNOSIS — I5020 Unspecified systolic (congestive) heart failure: Secondary | ICD-10-CM | POA: Diagnosis not present

## 2016-04-06 DIAGNOSIS — I2510 Atherosclerotic heart disease of native coronary artery without angina pectoris: Secondary | ICD-10-CM | POA: Diagnosis not present

## 2016-04-08 DIAGNOSIS — I5020 Unspecified systolic (congestive) heart failure: Secondary | ICD-10-CM | POA: Diagnosis not present

## 2016-04-08 DIAGNOSIS — J439 Emphysema, unspecified: Secondary | ICD-10-CM | POA: Diagnosis not present

## 2016-04-08 DIAGNOSIS — Z951 Presence of aortocoronary bypass graft: Secondary | ICD-10-CM | POA: Diagnosis not present

## 2016-04-08 DIAGNOSIS — G4733 Obstructive sleep apnea (adult) (pediatric): Secondary | ICD-10-CM | POA: Diagnosis not present

## 2016-04-08 DIAGNOSIS — I214 Non-ST elevation (NSTEMI) myocardial infarction: Secondary | ICD-10-CM | POA: Diagnosis not present

## 2016-04-08 DIAGNOSIS — E785 Hyperlipidemia, unspecified: Secondary | ICD-10-CM | POA: Diagnosis not present

## 2016-04-08 DIAGNOSIS — G609 Hereditary and idiopathic neuropathy, unspecified: Secondary | ICD-10-CM | POA: Diagnosis not present

## 2016-04-08 DIAGNOSIS — I2510 Atherosclerotic heart disease of native coronary artery without angina pectoris: Secondary | ICD-10-CM | POA: Diagnosis not present

## 2016-04-08 DIAGNOSIS — I110 Hypertensive heart disease with heart failure: Secondary | ICD-10-CM | POA: Diagnosis not present

## 2016-04-11 DIAGNOSIS — I110 Hypertensive heart disease with heart failure: Secondary | ICD-10-CM | POA: Diagnosis not present

## 2016-04-11 DIAGNOSIS — G609 Hereditary and idiopathic neuropathy, unspecified: Secondary | ICD-10-CM | POA: Diagnosis not present

## 2016-04-11 DIAGNOSIS — Z951 Presence of aortocoronary bypass graft: Secondary | ICD-10-CM | POA: Diagnosis not present

## 2016-04-11 DIAGNOSIS — G4733 Obstructive sleep apnea (adult) (pediatric): Secondary | ICD-10-CM | POA: Diagnosis not present

## 2016-04-11 DIAGNOSIS — I2510 Atherosclerotic heart disease of native coronary artery without angina pectoris: Secondary | ICD-10-CM | POA: Diagnosis not present

## 2016-04-11 DIAGNOSIS — I5020 Unspecified systolic (congestive) heart failure: Secondary | ICD-10-CM | POA: Diagnosis not present

## 2016-04-11 DIAGNOSIS — E785 Hyperlipidemia, unspecified: Secondary | ICD-10-CM | POA: Diagnosis not present

## 2016-04-11 DIAGNOSIS — I214 Non-ST elevation (NSTEMI) myocardial infarction: Secondary | ICD-10-CM | POA: Diagnosis not present

## 2016-04-11 DIAGNOSIS — J439 Emphysema, unspecified: Secondary | ICD-10-CM | POA: Diagnosis not present

## 2016-04-13 DIAGNOSIS — G609 Hereditary and idiopathic neuropathy, unspecified: Secondary | ICD-10-CM | POA: Diagnosis not present

## 2016-04-13 DIAGNOSIS — I5020 Unspecified systolic (congestive) heart failure: Secondary | ICD-10-CM | POA: Diagnosis not present

## 2016-04-13 DIAGNOSIS — I2510 Atherosclerotic heart disease of native coronary artery without angina pectoris: Secondary | ICD-10-CM | POA: Diagnosis not present

## 2016-04-13 DIAGNOSIS — Z951 Presence of aortocoronary bypass graft: Secondary | ICD-10-CM | POA: Diagnosis not present

## 2016-04-13 DIAGNOSIS — E785 Hyperlipidemia, unspecified: Secondary | ICD-10-CM | POA: Diagnosis not present

## 2016-04-13 DIAGNOSIS — I214 Non-ST elevation (NSTEMI) myocardial infarction: Secondary | ICD-10-CM | POA: Diagnosis not present

## 2016-04-13 DIAGNOSIS — J439 Emphysema, unspecified: Secondary | ICD-10-CM | POA: Diagnosis not present

## 2016-04-13 DIAGNOSIS — I110 Hypertensive heart disease with heart failure: Secondary | ICD-10-CM | POA: Diagnosis not present

## 2016-04-13 DIAGNOSIS — G4733 Obstructive sleep apnea (adult) (pediatric): Secondary | ICD-10-CM | POA: Diagnosis not present

## 2016-04-14 DIAGNOSIS — G609 Hereditary and idiopathic neuropathy, unspecified: Secondary | ICD-10-CM | POA: Diagnosis not present

## 2016-04-14 DIAGNOSIS — J439 Emphysema, unspecified: Secondary | ICD-10-CM | POA: Diagnosis not present

## 2016-04-14 DIAGNOSIS — E785 Hyperlipidemia, unspecified: Secondary | ICD-10-CM | POA: Diagnosis not present

## 2016-04-14 DIAGNOSIS — I2510 Atherosclerotic heart disease of native coronary artery without angina pectoris: Secondary | ICD-10-CM | POA: Diagnosis not present

## 2016-04-14 DIAGNOSIS — Z951 Presence of aortocoronary bypass graft: Secondary | ICD-10-CM | POA: Diagnosis not present

## 2016-04-14 DIAGNOSIS — G4733 Obstructive sleep apnea (adult) (pediatric): Secondary | ICD-10-CM | POA: Diagnosis not present

## 2016-04-14 DIAGNOSIS — I110 Hypertensive heart disease with heart failure: Secondary | ICD-10-CM | POA: Diagnosis not present

## 2016-04-14 DIAGNOSIS — I214 Non-ST elevation (NSTEMI) myocardial infarction: Secondary | ICD-10-CM | POA: Diagnosis not present

## 2016-04-14 DIAGNOSIS — I5020 Unspecified systolic (congestive) heart failure: Secondary | ICD-10-CM | POA: Diagnosis not present

## 2016-04-18 DIAGNOSIS — I214 Non-ST elevation (NSTEMI) myocardial infarction: Secondary | ICD-10-CM | POA: Diagnosis not present

## 2016-04-18 DIAGNOSIS — J439 Emphysema, unspecified: Secondary | ICD-10-CM | POA: Diagnosis not present

## 2016-04-18 DIAGNOSIS — I2510 Atherosclerotic heart disease of native coronary artery without angina pectoris: Secondary | ICD-10-CM | POA: Diagnosis not present

## 2016-04-18 DIAGNOSIS — I5022 Chronic systolic (congestive) heart failure: Secondary | ICD-10-CM | POA: Diagnosis not present

## 2016-04-18 DIAGNOSIS — J441 Chronic obstructive pulmonary disease with (acute) exacerbation: Secondary | ICD-10-CM | POA: Diagnosis not present

## 2016-04-18 DIAGNOSIS — G4733 Obstructive sleep apnea (adult) (pediatric): Secondary | ICD-10-CM | POA: Diagnosis not present

## 2016-04-18 DIAGNOSIS — I110 Hypertensive heart disease with heart failure: Secondary | ICD-10-CM | POA: Diagnosis not present

## 2016-04-18 DIAGNOSIS — J9601 Acute respiratory failure with hypoxia: Secondary | ICD-10-CM | POA: Diagnosis not present

## 2016-04-18 DIAGNOSIS — Z951 Presence of aortocoronary bypass graft: Secondary | ICD-10-CM | POA: Diagnosis not present

## 2016-04-18 DIAGNOSIS — I5020 Unspecified systolic (congestive) heart failure: Secondary | ICD-10-CM | POA: Diagnosis not present

## 2016-04-18 DIAGNOSIS — G609 Hereditary and idiopathic neuropathy, unspecified: Secondary | ICD-10-CM | POA: Diagnosis not present

## 2016-04-18 DIAGNOSIS — E785 Hyperlipidemia, unspecified: Secondary | ICD-10-CM | POA: Diagnosis not present

## 2016-04-20 DIAGNOSIS — G609 Hereditary and idiopathic neuropathy, unspecified: Secondary | ICD-10-CM | POA: Diagnosis not present

## 2016-04-20 DIAGNOSIS — Z951 Presence of aortocoronary bypass graft: Secondary | ICD-10-CM | POA: Diagnosis not present

## 2016-04-20 DIAGNOSIS — I2510 Atherosclerotic heart disease of native coronary artery without angina pectoris: Secondary | ICD-10-CM | POA: Diagnosis not present

## 2016-04-20 DIAGNOSIS — I5020 Unspecified systolic (congestive) heart failure: Secondary | ICD-10-CM | POA: Diagnosis not present

## 2016-04-20 DIAGNOSIS — G4733 Obstructive sleep apnea (adult) (pediatric): Secondary | ICD-10-CM | POA: Diagnosis not present

## 2016-04-20 DIAGNOSIS — J439 Emphysema, unspecified: Secondary | ICD-10-CM | POA: Diagnosis not present

## 2016-04-20 DIAGNOSIS — E785 Hyperlipidemia, unspecified: Secondary | ICD-10-CM | POA: Diagnosis not present

## 2016-04-20 DIAGNOSIS — I110 Hypertensive heart disease with heart failure: Secondary | ICD-10-CM | POA: Diagnosis not present

## 2016-04-20 DIAGNOSIS — I214 Non-ST elevation (NSTEMI) myocardial infarction: Secondary | ICD-10-CM | POA: Diagnosis not present

## 2016-04-21 ENCOUNTER — Ambulatory Visit (INDEPENDENT_AMBULATORY_CARE_PROVIDER_SITE_OTHER): Payer: Medicare PPO | Admitting: Family Medicine

## 2016-04-21 ENCOUNTER — Encounter: Payer: Self-pay | Admitting: Family Medicine

## 2016-04-21 VITALS — BP 105/55 | HR 72 | Ht 69.0 in | Wt 214.0 lb

## 2016-04-21 DIAGNOSIS — J449 Chronic obstructive pulmonary disease, unspecified: Secondary | ICD-10-CM | POA: Diagnosis not present

## 2016-04-21 DIAGNOSIS — J439 Emphysema, unspecified: Secondary | ICD-10-CM

## 2016-04-21 DIAGNOSIS — L989 Disorder of the skin and subcutaneous tissue, unspecified: Secondary | ICD-10-CM

## 2016-04-21 DIAGNOSIS — R29898 Other symptoms and signs involving the musculoskeletal system: Secondary | ICD-10-CM

## 2016-04-21 DIAGNOSIS — J984 Other disorders of lung: Secondary | ICD-10-CM

## 2016-04-21 DIAGNOSIS — I10 Essential (primary) hypertension: Secondary | ICD-10-CM | POA: Diagnosis not present

## 2016-04-21 NOTE — Progress Notes (Signed)
Subjective:    CC:   HPI:  COPD - He was recently hospitalized for acute respiratory failure. I saw him for his hospital follow-up and he was still quite short of breath and had difficulty ambulating from the parking lot into our building. He is actually feeling much better now. He still gets a little winded with activity but is able to walk now without significant difficulty. He is getting some home physical therapy twice a week and says that has really made a big difference in the strength in his legs. He suddenly felt like he is completely back to normal but feels like he is getting there. He no longer has a cough or any excess sputum production.  Hypertension- Pt denies chest pain, SOB, dizziness, or heart palpitations.  Taking meds as directed w/o problems.  Denies medication side effects.    He also has a skin lesion on the dorsum of his left hand. He says it's been there for quite a while. His neighbor looked at it and encouraged him to see a dermatologist to have it removed. He says it is tender at times. It has been gradually getting a little larger.  Past medical history, Surgical history, Family history not pertinant except as noted below, Social history, Allergies, and medications have been entered into the medical record, reviewed, and corrections made.   Review of Systems: No fevers, chills, night sweats, weight loss, chest pain, or shortness of breath.   Objective:    General: Well Developed, well nourished, and in no acute distress.  Neuro: Alert and oriented x3, extra-ocular muscles intact, sensation grossly intact.  HEENT: Normocephalic, atraumatic  Skin: Warm and dry, no rashes. He has a erythematous circular scaling leson on the dorsum of right hand about 1 cm in size.  Cardiac: Regular rate and rhythm, no murmurs rubs or gallops, no lower extremity edema.  Respiratory: Clear to auscultation bilaterally. Not using accessory muscles, speaking in full  sentences.   Impression and Recommendations:    HTN - Well controlled. Continue current regimen. Follow up in  4 months.    COPD - on exam today and he is back to baseline as far as respiratory is concerned. Still struggling with some lower extremities weakness. F/U in 4 months   Skin lesion on dorsum of left hand. Looks like either an actinic keratosis that's quite large or an early squamous cell skin cancer. Will refer to dermatology for further treatment options.  Lower extremity weakness-getting much better with home physical therapy.

## 2016-04-27 DIAGNOSIS — I110 Hypertensive heart disease with heart failure: Secondary | ICD-10-CM | POA: Diagnosis not present

## 2016-04-27 DIAGNOSIS — G609 Hereditary and idiopathic neuropathy, unspecified: Secondary | ICD-10-CM | POA: Diagnosis not present

## 2016-04-27 DIAGNOSIS — G4733 Obstructive sleep apnea (adult) (pediatric): Secondary | ICD-10-CM | POA: Diagnosis not present

## 2016-04-27 DIAGNOSIS — Z951 Presence of aortocoronary bypass graft: Secondary | ICD-10-CM | POA: Diagnosis not present

## 2016-04-27 DIAGNOSIS — I214 Non-ST elevation (NSTEMI) myocardial infarction: Secondary | ICD-10-CM | POA: Diagnosis not present

## 2016-04-27 DIAGNOSIS — I5020 Unspecified systolic (congestive) heart failure: Secondary | ICD-10-CM | POA: Diagnosis not present

## 2016-04-27 DIAGNOSIS — E785 Hyperlipidemia, unspecified: Secondary | ICD-10-CM | POA: Diagnosis not present

## 2016-04-27 DIAGNOSIS — J439 Emphysema, unspecified: Secondary | ICD-10-CM | POA: Diagnosis not present

## 2016-04-27 DIAGNOSIS — I2510 Atherosclerotic heart disease of native coronary artery without angina pectoris: Secondary | ICD-10-CM | POA: Diagnosis not present

## 2016-04-29 DIAGNOSIS — E785 Hyperlipidemia, unspecified: Secondary | ICD-10-CM | POA: Diagnosis not present

## 2016-04-29 DIAGNOSIS — G4733 Obstructive sleep apnea (adult) (pediatric): Secondary | ICD-10-CM | POA: Diagnosis not present

## 2016-04-29 DIAGNOSIS — I2510 Atherosclerotic heart disease of native coronary artery without angina pectoris: Secondary | ICD-10-CM | POA: Diagnosis not present

## 2016-04-29 DIAGNOSIS — I110 Hypertensive heart disease with heart failure: Secondary | ICD-10-CM | POA: Diagnosis not present

## 2016-04-29 DIAGNOSIS — J439 Emphysema, unspecified: Secondary | ICD-10-CM | POA: Diagnosis not present

## 2016-04-29 DIAGNOSIS — G609 Hereditary and idiopathic neuropathy, unspecified: Secondary | ICD-10-CM | POA: Diagnosis not present

## 2016-04-29 DIAGNOSIS — I214 Non-ST elevation (NSTEMI) myocardial infarction: Secondary | ICD-10-CM | POA: Diagnosis not present

## 2016-04-29 DIAGNOSIS — Z951 Presence of aortocoronary bypass graft: Secondary | ICD-10-CM | POA: Diagnosis not present

## 2016-04-29 DIAGNOSIS — I5020 Unspecified systolic (congestive) heart failure: Secondary | ICD-10-CM | POA: Diagnosis not present

## 2016-05-02 DIAGNOSIS — G4733 Obstructive sleep apnea (adult) (pediatric): Secondary | ICD-10-CM | POA: Diagnosis not present

## 2016-05-02 DIAGNOSIS — J439 Emphysema, unspecified: Secondary | ICD-10-CM | POA: Diagnosis not present

## 2016-05-02 DIAGNOSIS — I214 Non-ST elevation (NSTEMI) myocardial infarction: Secondary | ICD-10-CM | POA: Diagnosis not present

## 2016-05-02 DIAGNOSIS — Z951 Presence of aortocoronary bypass graft: Secondary | ICD-10-CM | POA: Diagnosis not present

## 2016-05-02 DIAGNOSIS — G609 Hereditary and idiopathic neuropathy, unspecified: Secondary | ICD-10-CM | POA: Diagnosis not present

## 2016-05-02 DIAGNOSIS — I5020 Unspecified systolic (congestive) heart failure: Secondary | ICD-10-CM | POA: Diagnosis not present

## 2016-05-02 DIAGNOSIS — E785 Hyperlipidemia, unspecified: Secondary | ICD-10-CM | POA: Diagnosis not present

## 2016-05-02 DIAGNOSIS — I110 Hypertensive heart disease with heart failure: Secondary | ICD-10-CM | POA: Diagnosis not present

## 2016-05-02 DIAGNOSIS — I2510 Atherosclerotic heart disease of native coronary artery without angina pectoris: Secondary | ICD-10-CM | POA: Diagnosis not present

## 2016-05-03 DIAGNOSIS — I110 Hypertensive heart disease with heart failure: Secondary | ICD-10-CM | POA: Diagnosis not present

## 2016-05-03 DIAGNOSIS — G609 Hereditary and idiopathic neuropathy, unspecified: Secondary | ICD-10-CM | POA: Diagnosis not present

## 2016-05-03 DIAGNOSIS — E785 Hyperlipidemia, unspecified: Secondary | ICD-10-CM | POA: Diagnosis not present

## 2016-05-03 DIAGNOSIS — I2510 Atherosclerotic heart disease of native coronary artery without angina pectoris: Secondary | ICD-10-CM | POA: Diagnosis not present

## 2016-05-03 DIAGNOSIS — J439 Emphysema, unspecified: Secondary | ICD-10-CM | POA: Diagnosis not present

## 2016-05-03 DIAGNOSIS — Z951 Presence of aortocoronary bypass graft: Secondary | ICD-10-CM | POA: Diagnosis not present

## 2016-05-03 DIAGNOSIS — I214 Non-ST elevation (NSTEMI) myocardial infarction: Secondary | ICD-10-CM | POA: Diagnosis not present

## 2016-05-03 DIAGNOSIS — I5020 Unspecified systolic (congestive) heart failure: Secondary | ICD-10-CM | POA: Diagnosis not present

## 2016-05-03 DIAGNOSIS — G4733 Obstructive sleep apnea (adult) (pediatric): Secondary | ICD-10-CM | POA: Diagnosis not present

## 2016-05-04 DIAGNOSIS — E785 Hyperlipidemia, unspecified: Secondary | ICD-10-CM | POA: Diagnosis not present

## 2016-05-04 DIAGNOSIS — G4733 Obstructive sleep apnea (adult) (pediatric): Secondary | ICD-10-CM | POA: Diagnosis not present

## 2016-05-04 DIAGNOSIS — I214 Non-ST elevation (NSTEMI) myocardial infarction: Secondary | ICD-10-CM | POA: Diagnosis not present

## 2016-05-04 DIAGNOSIS — I5020 Unspecified systolic (congestive) heart failure: Secondary | ICD-10-CM | POA: Diagnosis not present

## 2016-05-04 DIAGNOSIS — G609 Hereditary and idiopathic neuropathy, unspecified: Secondary | ICD-10-CM | POA: Diagnosis not present

## 2016-05-04 DIAGNOSIS — Z951 Presence of aortocoronary bypass graft: Secondary | ICD-10-CM | POA: Diagnosis not present

## 2016-05-04 DIAGNOSIS — I2510 Atherosclerotic heart disease of native coronary artery without angina pectoris: Secondary | ICD-10-CM | POA: Diagnosis not present

## 2016-05-04 DIAGNOSIS — I110 Hypertensive heart disease with heart failure: Secondary | ICD-10-CM | POA: Diagnosis not present

## 2016-05-04 DIAGNOSIS — J439 Emphysema, unspecified: Secondary | ICD-10-CM | POA: Diagnosis not present

## 2016-05-09 DIAGNOSIS — I2510 Atherosclerotic heart disease of native coronary artery without angina pectoris: Secondary | ICD-10-CM | POA: Diagnosis not present

## 2016-05-09 DIAGNOSIS — G4733 Obstructive sleep apnea (adult) (pediatric): Secondary | ICD-10-CM | POA: Diagnosis not present

## 2016-05-09 DIAGNOSIS — E785 Hyperlipidemia, unspecified: Secondary | ICD-10-CM | POA: Diagnosis not present

## 2016-05-09 DIAGNOSIS — I110 Hypertensive heart disease with heart failure: Secondary | ICD-10-CM | POA: Diagnosis not present

## 2016-05-09 DIAGNOSIS — I214 Non-ST elevation (NSTEMI) myocardial infarction: Secondary | ICD-10-CM | POA: Diagnosis not present

## 2016-05-09 DIAGNOSIS — Z951 Presence of aortocoronary bypass graft: Secondary | ICD-10-CM | POA: Diagnosis not present

## 2016-05-09 DIAGNOSIS — J439 Emphysema, unspecified: Secondary | ICD-10-CM | POA: Diagnosis not present

## 2016-05-09 DIAGNOSIS — I5020 Unspecified systolic (congestive) heart failure: Secondary | ICD-10-CM | POA: Diagnosis not present

## 2016-05-09 DIAGNOSIS — G609 Hereditary and idiopathic neuropathy, unspecified: Secondary | ICD-10-CM | POA: Diagnosis not present

## 2016-05-11 DIAGNOSIS — I214 Non-ST elevation (NSTEMI) myocardial infarction: Secondary | ICD-10-CM | POA: Diagnosis not present

## 2016-05-11 DIAGNOSIS — Z951 Presence of aortocoronary bypass graft: Secondary | ICD-10-CM | POA: Diagnosis not present

## 2016-05-11 DIAGNOSIS — I110 Hypertensive heart disease with heart failure: Secondary | ICD-10-CM | POA: Diagnosis not present

## 2016-05-11 DIAGNOSIS — I2510 Atherosclerotic heart disease of native coronary artery without angina pectoris: Secondary | ICD-10-CM | POA: Diagnosis not present

## 2016-05-11 DIAGNOSIS — J439 Emphysema, unspecified: Secondary | ICD-10-CM | POA: Diagnosis not present

## 2016-05-11 DIAGNOSIS — E785 Hyperlipidemia, unspecified: Secondary | ICD-10-CM | POA: Diagnosis not present

## 2016-05-11 DIAGNOSIS — I5020 Unspecified systolic (congestive) heart failure: Secondary | ICD-10-CM | POA: Diagnosis not present

## 2016-05-11 DIAGNOSIS — G609 Hereditary and idiopathic neuropathy, unspecified: Secondary | ICD-10-CM | POA: Diagnosis not present

## 2016-05-11 DIAGNOSIS — G4733 Obstructive sleep apnea (adult) (pediatric): Secondary | ICD-10-CM | POA: Diagnosis not present

## 2016-05-17 ENCOUNTER — Other Ambulatory Visit: Payer: Self-pay | Admitting: *Deleted

## 2016-05-17 MED ORDER — CARVEDILOL 12.5 MG PO TABS: 12.5000 mg | ORAL_TABLET | Freq: Two times a day (BID) | ORAL | 0 refills | Status: DC

## 2016-05-17 NOTE — Telephone Encounter (Signed)
REFIL 

## 2016-05-18 DIAGNOSIS — I110 Hypertensive heart disease with heart failure: Secondary | ICD-10-CM | POA: Diagnosis not present

## 2016-05-18 DIAGNOSIS — I2510 Atherosclerotic heart disease of native coronary artery without angina pectoris: Secondary | ICD-10-CM | POA: Diagnosis not present

## 2016-05-18 DIAGNOSIS — J439 Emphysema, unspecified: Secondary | ICD-10-CM | POA: Diagnosis not present

## 2016-05-18 DIAGNOSIS — G4733 Obstructive sleep apnea (adult) (pediatric): Secondary | ICD-10-CM | POA: Diagnosis not present

## 2016-05-18 DIAGNOSIS — I214 Non-ST elevation (NSTEMI) myocardial infarction: Secondary | ICD-10-CM | POA: Diagnosis not present

## 2016-05-18 DIAGNOSIS — G609 Hereditary and idiopathic neuropathy, unspecified: Secondary | ICD-10-CM | POA: Diagnosis not present

## 2016-05-18 DIAGNOSIS — E785 Hyperlipidemia, unspecified: Secondary | ICD-10-CM | POA: Diagnosis not present

## 2016-05-18 DIAGNOSIS — I5020 Unspecified systolic (congestive) heart failure: Secondary | ICD-10-CM | POA: Diagnosis not present

## 2016-05-18 DIAGNOSIS — Z951 Presence of aortocoronary bypass graft: Secondary | ICD-10-CM | POA: Diagnosis not present

## 2016-05-25 DIAGNOSIS — D692 Other nonthrombocytopenic purpura: Secondary | ICD-10-CM | POA: Diagnosis not present

## 2016-05-25 DIAGNOSIS — D0462 Carcinoma in situ of skin of left upper limb, including shoulder: Secondary | ICD-10-CM | POA: Diagnosis not present

## 2016-05-25 DIAGNOSIS — L821 Other seborrheic keratosis: Secondary | ICD-10-CM | POA: Diagnosis not present

## 2016-06-25 ENCOUNTER — Other Ambulatory Visit: Payer: Self-pay | Admitting: Family Medicine

## 2016-06-27 ENCOUNTER — Other Ambulatory Visit: Payer: Self-pay | Admitting: Cardiology

## 2016-06-27 NOTE — Telephone Encounter (Signed)
Rx request sent to pharmacy.  

## 2016-06-29 ENCOUNTER — Encounter: Payer: Self-pay | Admitting: Cardiology

## 2016-06-29 ENCOUNTER — Ambulatory Visit (INDEPENDENT_AMBULATORY_CARE_PROVIDER_SITE_OTHER): Payer: Medicare PPO | Admitting: Cardiology

## 2016-06-29 VITALS — BP 122/74 | HR 70 | Ht 69.0 in | Wt 218.8 lb

## 2016-06-29 DIAGNOSIS — R748 Abnormal levels of other serum enzymes: Secondary | ICD-10-CM

## 2016-06-29 DIAGNOSIS — I714 Abdominal aortic aneurysm, without rupture, unspecified: Secondary | ICD-10-CM

## 2016-06-29 DIAGNOSIS — I679 Cerebrovascular disease, unspecified: Secondary | ICD-10-CM | POA: Diagnosis not present

## 2016-06-29 DIAGNOSIS — R778 Other specified abnormalities of plasma proteins: Secondary | ICD-10-CM

## 2016-06-29 DIAGNOSIS — R7989 Other specified abnormal findings of blood chemistry: Secondary | ICD-10-CM

## 2016-06-29 DIAGNOSIS — I2510 Atherosclerotic heart disease of native coronary artery without angina pectoris: Secondary | ICD-10-CM | POA: Diagnosis not present

## 2016-06-29 NOTE — Progress Notes (Signed)
HPI: FU CHF and CAD. Patient had a myocardial infarction followed by coronary artery bypassing graft in 1994 in [place]. Cardiac catheterization in June of 2014 showed a 90% left main, occluded LAD, normal ramus, occluded circumflex and occluded right coronary artery. The LIMA to the LAD was patent. Ejection fraction was 35-40%. The patient underwent redo coronary artery bypass and graft in July of 2014 with a saphenous vein graft to the ramus and a saphenous vein graft to the PDA. ABIs October 2016 showed moderate insufficiency on the right and normal left. Abdominal ultrasound June 2017 showed abdominal aortic aneurysm measuring 4.3 cm. Carotid Dopplers June 2017 showed less than 50% stenosis bilaterally. Echo repeated February 2018 and showed ejection fraction 52-77%, grade 1 diastolic dysfunction, moderately reduced RV function and mild tricuspid regurgitation. Patient was admitted in February 2018 with flu symptoms. His troponins were checked and were elevated felt to be demand ischemia. He was treated with 48 hours of intravenous heparin. Patient also treated with Tamiflu, antibiotics, steroids and nebulizers. He improved. Patient also had acute renal insufficiency which improved. Since I last saw him, patient does have some dyspnea on exertion but no orthopnea or PND. No pedal edema, chest pain, palpitations or syncope.  Current Outpatient Prescriptions  Medication Sig Dispense Refill  . acetaminophen (TYLENOL) 500 MG tablet Take 1-2 tablets (500-1,000 mg total) by mouth every 6 (six) hours as needed for pain. 30 tablet 0  . Ascorbic Acid (VITAMIN C) 1000 MG tablet Take 1,000 mg by mouth daily.    [name] aspirin 81 MG tablet Take 81 mg by mouth daily.    . carvedilol (COREG) 12.5 MG tablet Take 1 tablet (12.5 mg total) by mouth 2 (two) times daily with a meal. NEED OV. 180 tablet 0  . Cholecalciferol (VITAMIN D PO) Take 3,000 mg by mouth daily.    . Cyanocobalamin (VITAMIN B-12 PO) Take 1  tablet by mouth daily.    . finasteride (PROSCAR) 5 MG tablet TAKE ONE TABLET BY MOUTH ONE TIME DAILY 90 tablet 1  . furosemide (LASIX) 40 MG tablet TAKE ONE TABLET BY MOUTH ONE TIME DAILY 90 tablet 3  . ipratropium-albuterol (DUONEB) 0.5-2.5 (3) MG/3ML SOLN Take 3 mLs by nebulization every 2 (two) hours as needed (wheeze, SOB). 60 mL 3  . losartan (COZAAR) 50 MG tablet TAKE ONE TABLET BY MOUTH ONE TIME DAILY 30 tablet 11  . Multiple Vitamin (MULTIVITAMIN) capsule Take 1 capsule by mouth daily.    . Nebulizer MISC Generic nebulizer plus supplies 1 each 0  . rosuvastatin (CRESTOR) 40 MG tablet TAKE 1 TABLET BY MOUTH EVERY DAY 30 tablet 11  . SYMBICORT 160-4.5 MCG/ACT inhaler INHALE TWO PUFFS BY MOUTH TWICE DAILY 10.2 Inhaler 6   No current facility-administered medications for this visit.      Past Medical History:  Diagnosis Date  . BPH (benign prostatic hyperplasia)   . CAD (coronary artery disease)    a. s/p CABG x1 with LIMA-LAD 1994. b. LM & 3v CAD by cath 07/2012  . CHF (congestive heart failure) ([place])    a. EF 35-40% by echo 07/2012.  [name] Complication of anesthesia    affected patients memory. Pt stated "I couldn't remember anything for three months...just bits and pieces"  . Emphysema    a. Moderate emphysema by CT 06/2012.  [name] Heart attack ([place]) 1994  . Hyperlipidemia   . Hypertension   . Hypoparathyroidism ([place])   . NSVT (nonsustained ventricular tachycardia) (  [place])    a. Seen on tele 07/2012.  . OSA on CPAP    pt stated he does not wear CPAP because he no longer has sleep apnea  . Pneumonia   . Pseudoaneurysm of left femoral artery ([place])    a. post cath s/p compression 07/2012, associated w/ anemia.  . Sciatica 2012   Qualifier: Diagnosis of  By: Madilyn Fireman MD, Barnetta Chapel      Past Surgical History:  Procedure Laterality Date  . CATARACT EXTRACTION, BILATERAL    . COLONOSCOPY    . CORONARY ARTERY BYPASS GRAFT  03-23-92  . CORONARY ARTERY BYPASS GRAFT N/A 08/14/2012    Procedure: REDO CORONARY ARTERY BYPASS GRAFTING (CABG);  Surgeon: Gaye Pollack, MD;  Location: [facility];  Service: Open Heart Surgery;  Laterality: N/A;  . INTRAOPERATIVE TRANSESOPHAGEAL ECHOCARDIOGRAM N/A 08/14/2012   Procedure: INTRAOPERATIVE TRANSESOPHAGEAL ECHOCARDIOGRAM;  Surgeon: Gaye Pollack, MD;  Location: [facility];  Service: Open Heart Surgery;  Laterality: N/A;  . LUMBAR LAMINECTOMY/DECOMPRESSION MICRODISCECTOMY Left 02/18/2015   Procedure: LUMBAR LAMINECTOMY/DECOMPRESSION MICRODISCECTOMY- LEFT FOUR-FIVE ;  Surgeon: Ashok Pall, MD;  Location: [facility];  Service: Neurosurgery;  Laterality: Left;    Social History   Social History  . Marital status: Married    Spouse name: N/A  . Number of children: N/A  . Years of education: N/A   Occupational History  . Not on file.   Social History Main Topics  . Smoking status: Current Some Day Smoker    Packs/day: 1.00    Years: 60.00    Last attempt to quit: 10/08/2010  . Smokeless tobacco: Never Used  . Alcohol use Yes     Comment: Occasional  . Drug use: No  . 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 Not on file   Social History Narrative  . No narrative on file    Family History  Problem Relation Age of Onset  . Heart attack Father 19  . Stroke Brother 23  . Hypertension Brother   . Hyperlipidemia Brother     ROS: no fevers or chills, productive cough, hemoptysis, dysphasia, odynophagia, melena, hematochezia, dysuria, hematuria, rash, seizure activity, orthopnea, PND, pedal edema, claudication. Remaining systems are negative.  Physical Exam: Well-developed well-nourished in no acute distress.  Skin is warm and dry.  HEENT is normal.  Neck is supple.  Chest diminished BS throughout Cardiovascular exam is regular rate and rhythm.  Abdominal exam nontender or distended. No masses  palpated. Extremities show no edema. neuro grossly intact   A/P  1 Recent admission with respiratory insufficiency-patient's troponin also noted to be elevated. This was felt likely to be demand ischemia. He is not having any chest pain. I will not pursue further ischemia evaluation at this point.  2 coronary artery disease-continue aspirin and statin.  3 ischemic cardiopathy-continue ARB and beta blocker.  4 abdominal aortic aneurysm-schedule follow-up ultrasound June 2018.  5 carotid artery disease-continue aspirin and statin. Schedule follow-up carotid Dopplers June 2018.  6 recent acute renal insufficiency-improved at time of recent discharge. Recheck renal function.  7 hyperlipidemia-continue statin. Check lipids and liver.   8 hypertension-blood pressure is controlled. Continue present medications.  Kirk Ruths, MD

## 2016-06-29 NOTE — Patient Instructions (Signed)
Medication Instructions:   NO CHANGE  Labwork:  Your physician recommends that you return for lab work WHEN FASTING  Testing/Procedures:  Your physician has requested that you have a carotid duplex. This test is an ultrasound of the carotid arteries in your neck. It looks at blood flow through these arteries that supply the brain with blood. Allow one hour for this exam. There are no restrictions or special instructions.DUE IN June  Your physician has requested that you have an abdominal aorta duplex. During this test, an ultrasound is used to evaluate the aorta. Allow 30 minutes for this exam. Do not eat after midnight the day before and avoid carbonated beverages DUE IN JUNE  [id number]  Follow-Up:  Your physician wants you to follow-up in: [place] will receive a reminder letter in the mail two months in advance. If you don't receive a letter, please call our office to schedule the follow-up appointment.   If you need a refill on your cardiac medications before your next appointment, please call your pharmacy.

## 2016-06-30 LAB — BASIC METABOLIC PANEL
BUN: 25 mg/dL (ref 7–25)
CALCIUM: 9.7 mg/dL (ref 8.6–10.3)
CO2: 29 mmol/L (ref 20–31)
Chloride: 105 mmol/L (ref 98–110)
Creat: 1.12 mg/dL — ABNORMAL HIGH (ref 0.70–1.11)
Glucose, Bld: 109 mg/dL — ABNORMAL HIGH (ref 65–99)
Potassium: 5.2 mmol/L (ref 3.5–5.3)
Sodium: 141 mmol/L (ref 135–146)

## 2016-06-30 LAB — HEPATIC FUNCTION PANEL
ALK PHOS: 43 U/L (ref 40–115)
ALT: 15 U/L (ref 9–46)
AST: 18 U/L (ref 10–35)
Albumin: 4 g/dL (ref 3.6–5.1)
BILIRUBIN DIRECT: 0.2 mg/dL (ref <=0.2)
BILIRUBIN INDIRECT: 0.4 mg/dL (ref 0.2–1.2)
TOTAL PROTEIN: 6.2 g/dL (ref 6.1–8.1)
Total Bilirubin: 0.6 mg/dL (ref 0.2–1.2)

## 2016-06-30 LAB — LIPID PANEL
CHOLESTEROL: 107 mg/dL (ref <200)
HDL: 37 mg/dL — ABNORMAL LOW (ref >40)
LDL Cholesterol: 39 mg/dL (ref <100)
Total CHOL/HDL Ratio: 2.9 Ratio (ref <5.0)
Triglycerides: 154 mg/dL — ABNORMAL HIGH (ref <150)
VLDL: 31 mg/dL — ABNORMAL HIGH (ref <30)

## 2016-07-12 ENCOUNTER — Ambulatory Visit (HOSPITAL_BASED_OUTPATIENT_CLINIC_OR_DEPARTMENT_OTHER)
Admission: RE | Admit: 2016-07-12 | Discharge: 2016-07-12 | Disposition: A | Discharge status: Home / Self Care | Payer: Medicare PPO | Source: Ambulatory Visit | Attending: Cardiology | Admitting: Cardiology

## 2016-07-12 DIAGNOSIS — I679 Cerebrovascular disease, unspecified: Secondary | ICD-10-CM | POA: Diagnosis not present

## 2016-07-12 DIAGNOSIS — I714 Abdominal aortic aneurysm, without rupture, unspecified: Secondary | ICD-10-CM

## 2016-07-12 DIAGNOSIS — I6523 Occlusion and stenosis of bilateral carotid arteries: Secondary | ICD-10-CM | POA: Diagnosis not present

## 2016-07-17 ENCOUNTER — Other Ambulatory Visit: Payer: Self-pay | Admitting: Cardiology

## 2016-07-17 ENCOUNTER — Other Ambulatory Visit: Payer: Self-pay | Admitting: Family Medicine

## 2016-07-17 DIAGNOSIS — J441 Chronic obstructive pulmonary disease with (acute) exacerbation: Secondary | ICD-10-CM

## 2016-08-17 ENCOUNTER — Other Ambulatory Visit: Payer: Self-pay | Admitting: Pharmacist

## 2016-08-17 NOTE — Patient Outreach (Signed)
Outreach call to Joseph Berg to complete medication review for [facility] quality metric. Left a HIPAA compliant message on the patient's voicemail. If have not heard from patient by next week, will give him another call at that time.  Harlow Asa, PharmD, [place] [facility] [phone]

## 2016-08-17 NOTE — Patient Outreach (Signed)
Receive a call back from [name].[name] HIPAA identifiers verified and verbal consent received. Schedule appointment with Joseph Berg for 08/19/16 at 2 pm to complete his annual medication review.  Harlow Asa, PharmD, [place] [facility] [phone]

## 2016-08-19 ENCOUNTER — Other Ambulatory Visit: Payer: Self-pay | Admitting: Pharmacist

## 2016-08-19 NOTE — Patient Outreach (Signed)
[facility]) Care Management  [place]   08/19/2016  TEJ MURDAUGH [birth year] [id number]  Subjective: Outreach call to [name], as scheduled, to complete medication review for [facility] quality metric. Spoke with patient. HIPAA identifiers verified and verbal consent received.  Review with Mr. Amborn each of his medications including name, dose, indication and administration. Mr. Overfelt reports that his wife helps to manage his medications, filing a weekly pillbox for him each week.   Counsel patient on the importance of adherence to his Symbicort inhaler, taking two puffs each morning and each evening, and rinsing his mouth out after each use. Patient verbalizes understanding. Patient reports that he receives some of his medications from his [facility] ([place]) pharmacy. However, reports that he has been getting this inhaler from his local pharmacy and it has been costly. Let Mr. Milley know that per the formulary from the [employer] website, Symbicort is on the [facility]'s formulary. Mr. Schnorr states that he will start getting this inhaler from the [place] for cost savings.  Mr. Kilker reports that he uses his generic Duoneb nebulizer solution in his nebulizer if he experiences shortness of breath. Denies having a metered dose rescue inhaler. Counsel patient on the importance of having a rescue inhaler that he can carry with him out of the house. Mr. Fung verbalizes understanding. Let patient know that I will contact his PCP to recommend that patient have an albuterol rescue inhaler. Mr. Ormiston reports that he also has an appointment with his PCP next week and that he will also follow up with her about this inhaler.  Patient denies any further medication questions/concerns. Provide patient with my phone number.  Objective:   Encounter Medications: Outpatient Encounter Prescriptions as of 08/19/2016  Medication Sig  . acetaminophen (TYLENOL) 500 MG  tablet Take 1-2 tablets (500-1,000 mg total) by mouth every 6 (six) hours as needed for pain.  . Ascorbic Acid (VITAMIN C) 1000 MG tablet Take 1,000 mg by mouth daily.  [name] aspirin 81 MG tablet Take 81 mg by mouth daily.  . carvedilol (COREG) 12.5 MG tablet Take 1 tablet (12.5 mg total) by mouth 2 (two) times daily with a meal. NEED OV.  [name] Cholecalciferol (VITAMIN D PO) Take 3,000 mg by mouth daily.  . Cyanocobalamin (VITAMIN B-12 PO) Take 1 tablet by mouth daily.  . finasteride (PROSCAR) 5 MG tablet TAKE ONE TABLET BY MOUTH ONE TIME DAILY  . furosemide (LASIX) 40 MG tablet TAKE ONE TABLET BY MOUTH ONE TIME DAILY  . ipratropium-albuterol (DUONEB) 0.5-2.5 (3) MG/3ML SOLN INHALE 1 VIAL (3ML) VIA NEBULIZER EVERY 2 HOURS AS NEEDED FOR WHEEZE OR SHORTNESS OF BREATH  . losartan (COZAAR) 50 MG tablet TAKE ONE TABLET BY MOUTH ONE TIME DAILY  . Multiple Vitamin (MULTIVITAMIN) capsule Take 1 capsule by mouth daily.  . Probiotic Product (TRUBIOTICS PO) Take 1 tablet by mouth daily.  . rosuvastatin (CRESTOR) 40 MG tablet TAKE 1 TABLET BY MOUTH EVERY DAY  . SYMBICORT 160-4.5 MCG/ACT inhaler INHALE TWO PUFFS BY MOUTH TWICE DAILY  . Nebulizer MISC Generic nebulizer plus supplies   No facility-administered encounter medications on file as of 08/19/2016.    Assessment:  Drugs sorted by system:  Cardiovascular: aspirin, carvedilol, furosemide, losartan, rosuvastatin  Pulmonary/Allergy: Duoneb, Symbicort  Gastrointestinal: Trubiotics  Pain: acetaminophen  Vitamins/Minerals: Vitamin C, Vitamin D, Vitamin B12, multivitamin  Miscellaneous: finasteride   Duplications in therapy: none noted Gaps in therapy: patient on maintenance Symbicort inhaler and ipratropium-albuterol nebulizer solution  for shortness of breath, but without rescue inhaler to carry with him. Medications to avoid in the elderly: none noted Drug interactions: no significant interactions noted  Plan:  1) Mr. Pann to call his [place]  provider to request a prescription for his Symbicort be sent to the [facility] pharmacy for cost savings.  2) I will send an InBasket message along with this note to patient's PCP to recommend that patient have an albuterol rescue inhaler.   3) Will close pharmacy episode.  Harlow Asa, PharmD, [place] [facility] [phone]

## 2016-08-23 ENCOUNTER — Telehealth: Payer: Self-pay | Admitting: Family Medicine

## 2016-08-23 ENCOUNTER — Ambulatory Visit: Payer: Medicare PPO | Admitting: Family Medicine

## 2016-08-23 MED ORDER — ALBUTEROL SULFATE HFA 108 (90 BASE) MCG/ACT IN AERS: 2.0000 | INHALATION_SPRAY | Freq: Four times a day (QID) | RESPIRATORY_TRACT | 2 refills | Status: DC | PRN

## 2016-08-23 NOTE — Telephone Encounter (Signed)
-----   Message from Vella Raring, [facility] sent at 08/22/2016  9:23 AM EDT ----- [name] 21-Jul-1933 [id number]  Dear Dr. Madilyn Fireman,   I had the opportunity to perform a medication review for Mr. Enfield on 08/19/16. Attached please find my note for this encounter.  Please consider prescribing the patient an albuterol rescue inhaler. Note that patient has Duoneb nebulizer solution for use in his nebulizer if he experiences shortness of breath. However, I would recommend that patient have a rescue inhaler that he can carry with him when he is away from home.   Please contact me for any assistance that I may be able to help support you with this patient.    Thank you!  Harlow Asa, PharmD, [place] [facility] [phone]

## 2016-08-23 NOTE — Telephone Encounter (Signed)
Call patient: I did get notification from the pharmacist that he consulted with. I did send of her prescription for an albuterol inhaler to [facility].  He can pick that up at his convenience

## 2016-08-23 NOTE — Telephone Encounter (Signed)
Called pt, he was in the shower. A male advised that he has an appointment at 10:00 am today. Advised male that information can be discussed with him at that time.

## 2016-08-23 NOTE — Telephone Encounter (Signed)
Pts appointment is at 2:00 p.m.

## 2016-08-26 ENCOUNTER — Other Ambulatory Visit: Payer: Self-pay | Admitting: Family Medicine

## 2016-08-29 ENCOUNTER — Encounter: Payer: Self-pay | Admitting: Family Medicine

## 2016-08-29 ENCOUNTER — Ambulatory Visit (INDEPENDENT_AMBULATORY_CARE_PROVIDER_SITE_OTHER): Payer: Medicare PPO | Admitting: Family Medicine

## 2016-08-29 VITALS — BP 135/63 | HR 66 | Ht 69.0 in | Wt 219.0 lb

## 2016-08-29 DIAGNOSIS — I10 Essential (primary) hypertension: Secondary | ICD-10-CM | POA: Diagnosis not present

## 2016-08-29 DIAGNOSIS — J984 Other disorders of lung: Secondary | ICD-10-CM | POA: Diagnosis not present

## 2016-08-29 DIAGNOSIS — R7301 Impaired fasting glucose: Secondary | ICD-10-CM | POA: Diagnosis not present

## 2016-08-29 DIAGNOSIS — M542 Cervicalgia: Secondary | ICD-10-CM

## 2016-08-29 DIAGNOSIS — J449 Chronic obstructive pulmonary disease, unspecified: Secondary | ICD-10-CM | POA: Diagnosis not present

## 2016-08-29 DIAGNOSIS — J439 Emphysema, unspecified: Secondary | ICD-10-CM

## 2016-08-29 DIAGNOSIS — H9201 Otalgia, right ear: Secondary | ICD-10-CM | POA: Diagnosis not present

## 2016-08-29 LAB — POCT GLYCOSYLATED HEMOGLOBIN (HGB A1C): HEMOGLOBIN A1C: 6.2

## 2016-08-29 NOTE — Progress Notes (Signed)
Subjective:    CC: HTN, IFG  HPI: Hypertension- Pt denies chest pain, SOB, dizziness, or heart palpitations.  Taking meds as directed w/o problems.  Denies medication side effects.    Impaired fasting glucose-no increased thirst or urination. No symptoms consistent with hypoglycemia.  COPD f/u - He's been using his Symbicort daily. He did not fill the albuterol prescription because he wasn't really sure what it was for.  He also complains of stiffness on the right side of his neck over the sternomastoid area up into his ear. He said his ear has been aching. This is been going on for about 2 weeks. No known trauma or injury. He has noticed some decreased hearing in both ears but this is not new. He's not had any fevers chills or drainage from the ear. No pain over the TMJ joint and no popping of the jaw. He has been using heating pad without significant relief.  Also complaining of bilateral hip pain. He had an MRI with gadolinium. He saw there was a lawsuit for gadolinium and wonders if this could be causing some persistent pain for him.  Past medical history, Surgical history, Family history not pertinant except as noted below, Social history, Allergies, and medications have been entered into the medical record, reviewed, and corrections made.   Review of Systems: No fevers, chills, night sweats, weight loss, chest pain, or shortness of breath.   Objective:    General: Well Developed, well nourished, and in no acute distress.  Neuro: Alert and oriented x3, extra-ocular muscles intact, sensation grossly intact.  HEENT: Normocephalic, atraumaticTympanic membranes are clear bilaterally with normal canals. Oropharynx is clear.  Skin: Warm and dry, no rashes. Cardiac: Regular rate and rhythm, no murmurs rubs or gallops, no lower extremity edema.  Respiratory: Clear to auscultation bilaterally. Not using accessory muscles, speaking in full sentences. MSK: Normal flexion of the cervical spine.  Decreased extension. Symmetric rotation right and left but decreased. Tender just at the base of the sternum clear mastoid would attaches to the clavicle. No cervical lymphadenopathy.   Impression and Recommendations:    HTN - Well controlled. Continue current regimen. Follow up in  6 months.    IFG - A1c up to 6.2, up from previous of 5.9. Encouraged him just to continue work on healthy diet low carb and regular exercise.  COPD - Explained to him that the albuterol is the same thing in his neb machine. This is just a portable four-minute says he is out and about he has something he can use incentive his nebulizer machine.  Right sided neck pain/right ear ache-exam itself is normal. Will perform tympanometry. Tympanometry was normal bilaterally. Exam itself was normal. Encourage gentle stretching, heat and if not improving over the next week or 2 then consider referral to ENT or possibly ultrasound of the neck.

## 2016-08-30 ENCOUNTER — Telehealth: Payer: Self-pay | Admitting: Family Medicine

## 2016-08-30 NOTE — Telephone Encounter (Signed)
Please call pt: I did find out that gadolinium can deposit in the kidneys, bone and brain tissue, especially if has severeal exposures in a short period of time.  They havn't shown any known harm in brain or bone. Can harm kidneys but if it does it happens immediately. I think if he is still having a lot of hip pain I think he should see one of our sports med docs

## 2016-08-31 NOTE — Telephone Encounter (Signed)
Called and left vm requesting call back.

## 2016-09-07 NOTE — Telephone Encounter (Signed)
Left message for a call back.

## 2016-09-12 NOTE — Telephone Encounter (Signed)
Pt advised of recommendation, verbalized understanding. He will contact office and schedule an appt with one of our sports med Providers.

## 2016-09-16 ENCOUNTER — Other Ambulatory Visit: Payer: Self-pay | Admitting: Family Medicine

## 2016-09-16 DIAGNOSIS — I509 Heart failure, unspecified: Secondary | ICD-10-CM | POA: Diagnosis not present

## 2016-09-16 DIAGNOSIS — M545 Low back pain: Secondary | ICD-10-CM | POA: Diagnosis not present

## 2016-09-16 DIAGNOSIS — N400 Enlarged prostate without lower urinary tract symptoms: Secondary | ICD-10-CM | POA: Diagnosis not present

## 2016-09-16 DIAGNOSIS — J449 Chronic obstructive pulmonary disease, unspecified: Secondary | ICD-10-CM | POA: Diagnosis not present

## 2016-09-16 DIAGNOSIS — Z6832 Body mass index (BMI) 32.0-32.9, adult: Secondary | ICD-10-CM | POA: Diagnosis not present

## 2016-09-16 DIAGNOSIS — I252 Old myocardial infarction: Secondary | ICD-10-CM | POA: Diagnosis not present

## 2016-09-16 DIAGNOSIS — E785 Hyperlipidemia, unspecified: Secondary | ICD-10-CM | POA: Diagnosis not present

## 2016-09-16 DIAGNOSIS — Z7982 Long term (current) use of aspirin: Secondary | ICD-10-CM | POA: Diagnosis not present

## 2016-09-16 DIAGNOSIS — I110 Hypertensive heart disease with heart failure: Secondary | ICD-10-CM | POA: Diagnosis not present

## 2016-10-20 ENCOUNTER — Ambulatory Visit (INDEPENDENT_AMBULATORY_CARE_PROVIDER_SITE_OTHER): Payer: Medicare PPO | Admitting: Family Medicine

## 2016-10-20 VITALS — BP 138/68 | HR 72 | Temp 98.2°F | Wt 221.0 lb

## 2016-10-20 DIAGNOSIS — Z23 Encounter for immunization: Secondary | ICD-10-CM

## 2016-10-20 NOTE — Progress Notes (Signed)
Agree with above 

## 2016-10-20 NOTE — Progress Notes (Signed)
Patient came into clinic today for flu vaccination. Patient was given a flu shot questionnaire prior to administration of immunization. All questions were answered no. Patient tolerated injection of flu immunization in right deltoid well, with no immediate complications. An information pamphlet regarding flu shot immunization and frequently asked questions was given to Patient prior to leaving clinic. Advised to contact our office with any questions/concerns.

## 2016-11-07 ENCOUNTER — Other Ambulatory Visit: Payer: Self-pay | Admitting: Cardiology

## 2016-12-27 ENCOUNTER — Telehealth: Payer: Self-pay | Admitting: Family Medicine

## 2016-12-27 ENCOUNTER — Other Ambulatory Visit: Payer: Self-pay | Admitting: *Deleted

## 2016-12-27 MED ORDER — ROSUVASTATIN CALCIUM 40 MG PO TABS: 40.0000 mg | ORAL_TABLET | Freq: Every day | ORAL | 0 refills | Status: DC

## 2016-12-27 NOTE — Telephone Encounter (Signed)
Sorry, I thought I sent message to you. Anderson Malta made patient appt.

## 2016-12-27 NOTE — Telephone Encounter (Signed)
Pt is having hip pain - he went to end of driveway to get trash cans and on his way back he said he almost passed out. He wants to be seen tomorrow but you have no openings at this point.  Please advise.

## 2016-12-28 ENCOUNTER — Ambulatory Visit: Payer: Medicare PPO | Admitting: Sports Medicine

## 2016-12-28 ENCOUNTER — Encounter: Payer: Self-pay | Admitting: Sports Medicine

## 2016-12-28 DIAGNOSIS — M7062 Trochanteric bursitis, left hip: Secondary | ICD-10-CM | POA: Diagnosis not present

## 2016-12-28 DIAGNOSIS — M7061 Trochanteric bursitis, right hip: Secondary | ICD-10-CM | POA: Diagnosis not present

## 2016-12-28 MED ORDER — CELECOXIB 200 MG PO CAPS: ORAL_CAPSULE | ORAL | 2 refills | Status: DC

## 2016-12-28 NOTE — Assessment & Plan Note (Signed)
Improving, adding Celebrex, rehab exercises. Return to see me in 1 month, injection if no better.

## 2016-12-28 NOTE — Progress Notes (Signed)
   Subjective:    I'm seeing this patient as a consultation for: Dr. Beatrice Lecher  CC: Bilateral hip pain  HPI: This is a pleasant 83 year old male, yesterday he had severe bilateral hip pain over the lateral aspect, no radiation, had trouble walking.  He simply rested and by today, his appointment date it had reduced by 50%.  Back pain is stable, no radiation past the knee.  Moderate, improving.  Past medical history, Surgical history, Family history not pertinant except as noted below, Social history, Allergies, and medications have been entered into the medical record, reviewed, and no changes needed.   Review of Systems: No headache, visual changes, nausea, vomiting, diarrhea, constipation, dizziness, abdominal pain, skin rash, fevers, chills, night sweats, weight loss, swollen lymph nodes, body aches, joint swelling, muscle aches, chest pain, shortness of breath, mood changes, visual or auditory hallucinations.   Objective:   General: Well Developed, well nourished, and in no acute distress.  Neuro:  Extra-ocular muscles intact, able to move all 4 extremities, sensation grossly intact.  Deep tendon reflexes tested were normal. Psych: Alert and oriented, mood congruent with affect. ENT:  Ears and nose appear unremarkable.  Hearing grossly normal. Neck: Unremarkable overall appearance, trachea midline.  No visible thyroid enlargement. Eyes: Conjunctivae and lids appear unremarkable.  Pupils equal and round. Skin: Warm and dry, no rashes noted.  Cardiovascular: Pulses palpable, no extremity edema. Bilateral hips: ROM IR: 60 Deg, ER: 60 Deg, Flexion: 120 Deg, Extension: 100 Deg, Abduction: 45 Deg, Adduction: 45 Deg Strength IR: 5/5, ER: 5/5, Flexion: 5/5, Extension: 5/5, Abduction: 5/5, Adduction: 5/5 Pelvic alignment unremarkable to inspection and palpation. Standing hip rotation and gait without trendelenburg / unsteadiness. Greater trochanter with tenderness to palpation. No  tenderness over piriformis. No SI joint tenderness and normal minimal SI movement.  Impression and Recommendations:   This case required medical decision making of moderate complexity.  Trochanteric bursitis of both hips Improving, adding Celebrex, rehab exercises. Return to see me in 1 month, injection if no better. ___________________________________________ Gwen Her. Dianah Field, M.D., ABFM., CAQSM. Primary Care and [place] Instructor of [place] [employer] [facility]

## 2017-01-12 ENCOUNTER — Other Ambulatory Visit: Payer: Self-pay | Admitting: Cardiology

## 2017-01-23 ENCOUNTER — Other Ambulatory Visit: Payer: Self-pay | Admitting: Cardiology

## 2017-01-23 NOTE — Telephone Encounter (Signed)
REFILL 

## 2017-01-24 ENCOUNTER — Other Ambulatory Visit: Payer: Self-pay | Admitting: Cardiology

## 2017-01-27 ENCOUNTER — Ambulatory Visit: Payer: Medicare PPO | Admitting: Sports Medicine

## 2017-01-27 ENCOUNTER — Ambulatory Visit (HOSPITAL_BASED_OUTPATIENT_CLINIC_OR_DEPARTMENT_OTHER)
Admission: RE | Admit: 2017-01-27 | Discharge: 2017-01-27 | Disposition: A | Discharge status: Home / Self Care | Payer: Medicare PPO | Source: Ambulatory Visit | Attending: Sports Medicine | Admitting: Sports Medicine

## 2017-01-27 ENCOUNTER — Ambulatory Visit (INDEPENDENT_AMBULATORY_CARE_PROVIDER_SITE_OTHER): Payer: Medicare PPO

## 2017-01-27 DIAGNOSIS — M19011 Primary osteoarthritis, right shoulder: Secondary | ICD-10-CM

## 2017-01-27 DIAGNOSIS — M25511 Pain in right shoulder: Secondary | ICD-10-CM | POA: Diagnosis not present

## 2017-01-27 DIAGNOSIS — M79661 Pain in right lower leg: Secondary | ICD-10-CM | POA: Insufficient documentation

## 2017-01-27 DIAGNOSIS — R936 Abnormal findings on diagnostic imaging of limbs: Secondary | ICD-10-CM | POA: Diagnosis not present

## 2017-01-27 DIAGNOSIS — M7062 Trochanteric bursitis, left hip: Secondary | ICD-10-CM

## 2017-01-27 DIAGNOSIS — M79662 Pain in left lower leg: Secondary | ICD-10-CM

## 2017-01-27 DIAGNOSIS — M7989 Other specified soft tissue disorders: Secondary | ICD-10-CM | POA: Insufficient documentation

## 2017-01-27 DIAGNOSIS — M7061 Trochanteric bursitis, right hip: Secondary | ICD-10-CM | POA: Diagnosis not present

## 2017-01-27 NOTE — Assessment & Plan Note (Signed)
Resolved with conservative measures. 

## 2017-01-27 NOTE — Assessment & Plan Note (Signed)
Glenohumeral joint injection as above. X-rays. Return in 1 month.

## 2017-01-27 NOTE — Assessment & Plan Note (Signed)
DVT ultrasound, heel lifts, rehab exercises given.

## 2017-01-27 NOTE — Progress Notes (Signed)
  Subjective:    CC: Follow-up  HPI: Right shoulder pain: Started about a month ago, localized over the joint line, worse with abduction, external rotation, significant gelling, moderate, persistent without radiation.  Bilateral trochanteric bursitis: Resolved with conservative measures.  Left calf pain: With swelling, present for several days, no trauma.  Moderate, persistent.  Past medical history:  Negative.  See flowsheet/record as well for more information.  Surgical history: Negative.  See flowsheet/record as well for more information.  Family history: Negative.  See flowsheet/record as well for more information.  Social history: Negative.  See flowsheet/record as well for more information.  Allergies, and medications have been entered into the medical record, reviewed, and no changes needed.   (To billers/coders, pertinent past medical, social, surgical, family history can be found in problem list, if problem list is marked as reviewed then this indicates that past medical, social, surgical, family history was also reviewed)  Review of Systems: No fevers, chills, night sweats, weight loss, chest pain, or shortness of breath.   Objective:    General: Well Developed, well nourished, and in no acute distress.  Neuro: Alert and oriented x3, extra-ocular muscles intact, sensation grossly intact.  HEENT: Normocephalic, atraumatic, pupils equal round reactive to light, neck supple, no masses, no lymphadenopathy, thyroid nonpalpable.  Skin: Warm and dry, no rashes. Cardiac: Regular rate and rhythm, no murmurs rubs or gallops, no lower extremity edema.  Respiratory: Clear to auscultation bilaterally. Not using accessory muscles, speaking in full sentences. Right shoulder: Inspection reveals no abnormalities, atrophy or asymmetry. Palpation is normal with no tenderness over AC joint or bicipital groove. ROM is full in all planes. Rotator cuff strength normal throughout. No signs of  impingement with negative Neer and Hawkin's tests, empty can. Speeds and Yergason's tests normal. Reproduction of pain with abduction and external rotation Normal scapular function observed. No painful arc and no drop arm sign. No apprehension sign Left calf: Swollen, positive Homans sign, tender to palpation at the musculotendinous junction of the gastrocnemius.  Negative Thompson's test  Procedure: Real-time Ultrasound Guided Injection of right glenohumeral joint using a posterior approach I guided a 22-gauge spinal needle into the joint and then injected 1 cc kenalog 40, Device: GE Logiq E  Verbal informed consent obtained.  Time-out conducted.  Noted no overlying erythema, induration, or other signs of local infection.  Skin prepped in a sterile fashion.  Local anesthesia: Topical Ethyl chloride.  With sterile technique and under real time ultrasound guidance: 1 cc lidocaine, 1 cc bupivacaine. Completed without difficulty  Pain immediately resolved suggesting accurate placement of the medication.  Advised to call if fevers/chills, erythema, induration, drainage, or persistent bleeding.  Images permanently stored and available for review in the ultrasound unit.  Impression: Technically successful ultrasound guided injection.  Impression and Recommendations:    Primary osteoarthritis of right shoulder Glenohumeral joint injection as above. X-rays. Return in 1 month.  Pain of left calf DVT ultrasound, heel lifts, rehab exercises given.  Trochanteric bursitis of both hips Resolved with conservative measures.  ___________________________________________ Gwen Her. Dianah Field, M.D., ABFM., CAQSM. Primary Care and [place] Instructor of [place] [facility] [employer]

## 2017-02-10 ENCOUNTER — Other Ambulatory Visit: Payer: Self-pay | Admitting: Family Medicine

## 2017-02-10 ENCOUNTER — Other Ambulatory Visit: Payer: Self-pay | Admitting: Cardiology

## 2017-02-10 DIAGNOSIS — J441 Chronic obstructive pulmonary disease with (acute) exacerbation: Secondary | ICD-10-CM

## 2017-02-15 ENCOUNTER — Encounter: Payer: Self-pay | Admitting: Physician Assistant

## 2017-02-15 ENCOUNTER — Ambulatory Visit: Payer: Medicare PPO | Admitting: Physician Assistant

## 2017-02-15 VITALS — BP 125/75 | HR 79 | Ht 68.0 in | Wt 219.0 lb

## 2017-02-15 DIAGNOSIS — J439 Emphysema, unspecified: Secondary | ICD-10-CM

## 2017-02-15 DIAGNOSIS — J984 Other disorders of lung: Secondary | ICD-10-CM | POA: Diagnosis not present

## 2017-02-15 DIAGNOSIS — J449 Chronic obstructive pulmonary disease, unspecified: Secondary | ICD-10-CM | POA: Diagnosis not present

## 2017-02-15 DIAGNOSIS — J441 Chronic obstructive pulmonary disease with (acute) exacerbation: Secondary | ICD-10-CM | POA: Diagnosis not present

## 2017-02-15 MED ORDER — AZITHROMYCIN 250 MG PO TABS: ORAL_TABLET | ORAL | 0 refills | Status: DC

## 2017-02-15 MED ORDER — HYDROCODONE-HOMATROPINE 5-1.5 MG/5ML PO SYRP: 5.0000 mL | ORAL_SOLUTION | Freq: Every evening | ORAL | 0 refills | Status: DC | PRN

## 2017-02-15 MED ORDER — PREDNISONE 50 MG PO TABS: ORAL_TABLET | ORAL | 0 refills | Status: DC

## 2017-02-15 NOTE — Patient Instructions (Signed)
Chronic Obstructive Pulmonary Disease Exacerbation  Chronic obstructive pulmonary disease (COPD) is a common lung problem. In COPD, the flow of air from the lungs is limited. COPD exacerbations are times that breathing gets worse and you need extra treatment. Without treatment they can be life threatening. If they happen often, your lungs can become more damaged. If your COPD gets worse, your doctor may treat you with:  ? Medicines.  ? Oxygen.  ? Different ways to clear your airway, such as using a mask.    Follow these instructions at home:  ? Do not smoke.  ? Avoid tobacco smoke and other things that bother your lungs.  ? If given, take your antibiotic medicine as told. Finish the medicine even if you start to feel better.  ? Only take medicines as told by your doctor.  ? Drink enough fluids to keep your pee (urine) clear or pale yellow (unless your doctor has told you not to).  ? Use a cool mist machine (vaporizer).  ? If you use oxygen or a machine that turns liquid medicine into a mist (nebulizer), continue to use them as told.  ? Keep up with shots (vaccinations) as told by your doctor.  ? Exercise regularly.  ? Eat healthy foods.  ? Keep all doctor visits as told.  Get help right away if:  ? You are very short of breath and it gets worse.  ? You have trouble talking.  ? You have bad chest pain.  ? You have blood in your spit (sputum).  ? You have a fever.  ? You keep throwing up (vomiting).  ? You feel weak, or you pass out (faint).  ? You feel confused.  ? You keep getting worse.  This information is not intended to replace advice given to you by your health care provider. Make sure you discuss any questions you have with your health care provider.  Document Released: 01/13/2011 Document Revised: 07/02/2015 Document Reviewed: 09/28/2012  Elsevier Interactive Patient Education ? 2017 Elsevier Inc.

## 2017-02-15 NOTE — Progress Notes (Signed)
Subjective:    Patient ID: Joseph Berg, male    DOB: 1934-01-03, 84 y.o.   MRN: [id number]  HPI  Pt is a 84 yo male with COPD, CAD, NSVT, CHF who presents to the clinic with one week of productive coughing, fatigue, SOB. He is taking symbicort and duoneb 2x daily and mostly controlled. His daughter was sick first and then he got sick. No fever, chills, body aches. He is taking dayquil, nyquil, robatussin, and delsym. He is concerned he has the flu. He is having cough so bad at night he can not sleep.   .. Active Ambulatory Problems    Diagnosis Date Noted  . INSULIN RESISTANCE SYNDROME 04/16/2007  . HYPERLIPIDEMIA NEC/NOS 11/14/2006  . HYPERTENSION, BENIGN ESSENTIAL 11/14/2006  . SCIATICA 11/10/2009  . Hypoparathyroidism ([place]) 07/30/2010  . History of tobacco abuse 08/23/2010  . BPH (benign prostatic hyperplasia) 03/02/2011  . OSA (obstructive sleep apnea) 01/24/2012  . CAD (coronary artery disease) of artery bypass graft 07/06/2012  . Chronic systolic congestive heart failure ([place]) 07/11/2012  . Cardiomyopathy, ischemic 07/25/2012  . Near syncope 08/01/2012  . S/P CABG x 2 08/17/2012  . Aortic calcification ([place]) 06/10/2013  . AAA (abdominal aortic aneurysm) without rupture ([place]) 06/24/2013  . Cerebrovascular disease 06/26/2013  . Mixed restrictive and obstructive lung disease ([place]) 01/16/2014  . Lumbar degenerative disc disease 05/13/2014  . Claudication of right lower extremity ([place]) 12/09/2014  . HNP (herniated nucleus pulposus), lumbar 02/18/2015  . COPD exacerbation ([place]) 03/27/2015  . Postlaminectomy syndrome, lumbar region 12/14/2015  . AKI (acute kidney injury) ([place]) 03/17/2016  . Other emphysema ([place]) 03/17/2016  . Acute respiratory failure with hypoxia ([place]) 03/17/2016  . Hypotension 03/17/2016  . Hyperglycemia 03/17/2016  . Demand ischemia ([place])   . Trochanteric bursitis of both hips 12/28/2016  . Primary osteoarthritis of right shoulder 01/27/2017  .  Pain of left calf 01/27/2017   Resolved Ambulatory Problems    Diagnosis Date Noted  . Abscess 05/02/2011  . Adiposity 06/20/2014  . Acute respiratory failure with hypoxemia ([place])   . Flu-like symptoms   . Acute respiratory distress    Past Medical History:  Diagnosis Date  . BPH (benign prostatic hyperplasia)   . CAD (coronary artery disease)   . CHF (congestive heart failure) ([place])   . Complication of anesthesia   . Emphysema   . Heart attack ([place]) 1994  . Hyperlipidemia   . Hypertension   . Hypoparathyroidism ([place])   . NSVT (nonsustained ventricular tachycardia) ([place])   . OSA on CPAP   . Pneumonia   . Pseudoaneurysm of left femoral artery ([place])   . Sciatica 2012     Review of Systems See HPI.     Objective:   Physical Exam  Constitutional: He is oriented to person, place, and time. He appears well-developed and well-nourished.  HENT:  Head: Normocephalic and atraumatic.  Right Ear: External ear normal.  Left Ear: External ear normal.  Nose: Nose normal.  Mouth/Throat: Oropharynx is clear and moist. No oropharyngeal exudate.  TM"s clear.  Negative for any sinus tenderness.   Eyes: Conjunctivae are normal. Right eye exhibits no discharge. Left eye exhibits no discharge.  Neck: Normal range of motion. Neck supple.  Cardiovascular: Normal rate, regular rhythm and normal heart sounds.  Pulmonary/Chest:  Scattered wheezing and rhonchi. Overall coarse breath sounds with crackles heard at base of both lungs.   Lymphadenopathy:    He has no cervical adenopathy.  Neurological: He is alert  and oriented to person, place, and time.  Skin:  No pedal edema.   Psychiatric: He has a normal mood and affect. His behavior is normal.          Assessment & Plan:  Marland KitchenMarland KitchenZeyad was seen today for cough and right ear pain.  Diagnoses and all orders for this visit:  COPD exacerbation ([place]) -     predniSONE (DELTASONE) 50 MG tablet; Take one tablet for 5 days. -      azithromycin (ZITHROMAX) 250 MG tablet; Take 2 tablets now and then one tablet for 4 days. -     HYDROcodone-homatropine (HYCODAN) 5-1.5 MG/5ML syrup; Take 5 mLs by mouth at bedtime as needed. For cough.  Mixed restrictive and obstructive lung disease ([place])   Treated for COPD exacerbation. Pulse ox is stable at baseline.  Encouraged to increase duoneb for next 3-4 days to every 4hrs as needed. Rest and hydrate. Pt requested hycodan for night time cough. Acute supply given and advised to only use at bedtime. Follow up as needed.   Reassurance given I do not think he has the flu. Discussed flu symptoms. Likely viral URI symptoms progressed into exacerbation.   No signs of CHF exacerbation.   [place] controlled substance database reviewed with no concerns.

## 2017-02-18 ENCOUNTER — Other Ambulatory Visit: Payer: Self-pay | Admitting: Family Medicine

## 2017-02-22 DIAGNOSIS — H5212 Myopia, left eye: Secondary | ICD-10-CM | POA: Diagnosis not present

## 2017-02-22 DIAGNOSIS — H59811 Chorioretinal scars after surgery for detachment, right eye: Secondary | ICD-10-CM | POA: Diagnosis not present

## 2017-02-24 ENCOUNTER — Ambulatory Visit: Payer: Medicare PPO | Admitting: Sports Medicine

## 2017-02-24 ENCOUNTER — Other Ambulatory Visit: Payer: Self-pay | Admitting: Cardiology

## 2017-02-24 NOTE — Telephone Encounter (Signed)
REFILL 

## 2017-02-27 ENCOUNTER — Encounter: Payer: Self-pay | Admitting: Sports Medicine

## 2017-02-27 ENCOUNTER — Ambulatory Visit (INDEPENDENT_AMBULATORY_CARE_PROVIDER_SITE_OTHER): Payer: Medicare PPO | Admitting: Sports Medicine

## 2017-02-27 DIAGNOSIS — M5136 Other intervertebral disc degeneration, lumbar region: Secondary | ICD-10-CM | POA: Diagnosis not present

## 2017-02-27 DIAGNOSIS — M51369 Other intervertebral disc degeneration, lumbar region without mention of lumbar back pain or lower extremity pain: Secondary | ICD-10-CM

## 2017-02-27 NOTE — Progress Notes (Signed)
Subjective:    I'm seeing this patient as a consultation for: Dr. Beatrice Lecher, Dr. Ashok Pall  CC: Back pain  HPI: This is a pleasant 84 year old male with known lumbar degenerative disc disease, spinal stenosis.  He initially did well after several epidurals in 2016, subsequently he continued to have pain, both axial and radicular, he had a left-sided L4-L5 discectomy and laminectomy by Dr. Christella Noa.  He had some relief in his radicular symptoms but now is having a recurrence.  Moderate, persistent, axial back pain worse with sitting, flexion, Valsalva as well as radiation of the pain into the posterior aspect of the left calf but nothing overtly down to the foot.  No bowel or bladder dysfunction, saddle numbness, constitutional symptoms.  I reviewed the past medical history, family history, social history, surgical history, and allergies today and no changes were needed.  Please see the problem list section below in epic for further details.  Past Medical History: Past Medical History:  Diagnosis Date  . BPH (benign prostatic hyperplasia)   . CAD (coronary artery disease)    a. s/p CABG x1 with LIMA-LAD 1994. b. LM & 3v CAD by cath 07/2012  . CHF (congestive heart failure) ([place])    a. EF 35-40% by echo 07/2012.  [name] Complication of anesthesia    affected patients memory. Pt stated "I couldn't remember anything for three months...just bits and pieces"  . Emphysema    a. Moderate emphysema by CT 06/2012.  [name] Heart attack ([place]) 1994  . Hyperlipidemia   . Hypertension   . Hypoparathyroidism ([place])   . NSVT (nonsustained ventricular tachycardia) ([place])    a. Seen on tele 07/2012.  . OSA on CPAP    pt stated he does not wear CPAP because he no longer has sleep apnea  . Pneumonia   . Pseudoaneurysm of left femoral artery ([place])    a. post cath s/p compression 07/2012, associated w/ anemia.  . Sciatica 2012   Qualifier: Diagnosis of  By: Madilyn Fireman MD, Barnetta Chapel     Past Surgical  History: Past Surgical History:  Procedure Laterality Date  . CATARACT EXTRACTION, BILATERAL    . COLONOSCOPY    . CORONARY ARTERY BYPASS GRAFT  03-23-92  . CORONARY ARTERY BYPASS GRAFT N/A 08/14/2012   Procedure: REDO CORONARY ARTERY BYPASS GRAFTING (CABG);  Surgeon: Gaye Pollack, MD;  Location: [facility];  Service: Open Heart Surgery;  Laterality: N/A;  . INTRAOPERATIVE TRANSESOPHAGEAL ECHOCARDIOGRAM N/A 08/14/2012   Procedure: INTRAOPERATIVE TRANSESOPHAGEAL ECHOCARDIOGRAM;  Surgeon: Gaye Pollack, MD;  Location: [facility];  Service: Open Heart Surgery;  Laterality: N/A;  . LUMBAR LAMINECTOMY/DECOMPRESSION MICRODISCECTOMY Left 02/18/2015   Procedure: LUMBAR LAMINECTOMY/DECOMPRESSION MICRODISCECTOMY- LEFT FOUR-FIVE ;  Surgeon: Ashok Pall, MD;  Location: [facility];  Service: Neurosurgery;  Laterality: Left;   Social History: Social History   Socioeconomic History  . Marital status: Married    Spouse name: None  . Number of children: None  . Years of education: None  . Highest education level: None  Social Needs  . Financial resource strain: None  . Food insecurity - worry: None  . Food insecurity - inability: None  . Transportation needs - medical: None  . Transportation needs - non-medical: None  Occupational History  . None  Tobacco Use  . Smoking status: Current Some Day Smoker    Packs/day: 1.00    Years: 60.00    Pack years: 60.00    Last attempt to quit: 10/08/2010  Years since quitting: 6.3  . Smokeless tobacco: Never Used  Substance and Sexual Activity  . Alcohol use: Yes    Comment: Occasional  . Drug use: No  . Sexual activity: None    Comment: works part-time, Conservation officer, [employer] completed H, married, no children, regular exercise.S  Other Topics Concern  . None  Social History Narrative  . None   Family History: Family History  Problem Relation Age of Onset  . Heart attack Father 20  . Stroke Brother 56  . Hypertension Brother   . Hyperlipidemia Brother     Allergies: Allergies  Allergen Reactions  . Hctz [Hydrochlorothiazide] Other (See Comments)    Affected renal function.    Medications: See med rec.  Review of Systems: No headache, visual changes, nausea, vomiting, diarrhea, constipation, dizziness, abdominal pain, skin rash, fevers, chills, night sweats, weight loss, swollen lymph nodes, body aches, joint swelling, muscle aches, chest pain, shortness of breath, mood changes, visual or auditory hallucinations.   Objective:   General: Well Developed, well nourished, and in no acute distress.  Neuro:  Extra-ocular muscles intact, able to move all 4 extremities, sensation grossly intact.  Deep tendon reflexes tested were normal. Psych: Alert and oriented, mood congruent with affect. ENT:  Ears and nose appear unremarkable.  Hearing grossly normal. Neck: Unremarkable overall appearance, trachea midline.  No visible thyroid enlargement. Eyes: Conjunctivae and lids appear unremarkable.  Pupils equal and round. Skin: Warm and dry, no rashes noted.  Cardiovascular: Pulses palpable, no extremity edema.  MRI personally reviewed, there does appear to be good decompression with a left hemilaminectomy and discectomy at the L4-L5 level.  He does have a broad-based disc extrusion at L5-S1 but does appear to contact the left far lateral L5 nerve root.  Impression and Recommendations:   This case required medical decision making of moderate complexity.  Lumbar degenerative disc disease Post L4-L5 laminectomy in 2017 by Dr. Christella Noa, post operative MRI shows good decompression at the L4-L5 level, he is having persistent left-sided posterior calf and lateral calf radicular pain as well as axial discogenic back pain. He has multilevel facet arthritis. We are going to initially proceed with a left L5-S1 transforaminal epidural. If insufficient relief here we will try some facet joint injections. I am also going to add some aggressive formal physical  therapy. ___________________________________________ Gwen Her. Dianah Field, M.D., ABFM., CAQSM. Primary Care and [place] Instructor of [employer] [facility]

## 2017-02-27 NOTE — Assessment & Plan Note (Signed)
Post L4-L5 laminectomy in 2017 by Dr. Christella Noa, post operative MRI shows good decompression at the L4-L5 level, he is having persistent left-sided posterior calf and lateral calf radicular pain as well as axial discogenic back pain. He has multilevel facet arthritis. We are going to initially proceed with a left L5-S1 transforaminal epidural. If insufficient relief here we will try some facet joint injections. I am also going to add some aggressive formal physical therapy.

## 2017-03-02 ENCOUNTER — Ambulatory Visit (INDEPENDENT_AMBULATORY_CARE_PROVIDER_SITE_OTHER): Payer: Medicare PPO | Admitting: Family Medicine

## 2017-03-02 ENCOUNTER — Encounter: Payer: Self-pay | Admitting: Family Medicine

## 2017-03-02 VITALS — BP 122/56 | HR 70 | Ht 68.0 in | Wt 226.0 lb

## 2017-03-02 DIAGNOSIS — I10 Essential (primary) hypertension: Secondary | ICD-10-CM | POA: Diagnosis not present

## 2017-03-02 DIAGNOSIS — L989 Disorder of the skin and subcutaneous tissue, unspecified: Secondary | ICD-10-CM | POA: Diagnosis not present

## 2017-03-02 DIAGNOSIS — J984 Other disorders of lung: Secondary | ICD-10-CM | POA: Diagnosis not present

## 2017-03-02 DIAGNOSIS — J441 Chronic obstructive pulmonary disease with (acute) exacerbation: Secondary | ICD-10-CM | POA: Diagnosis not present

## 2017-03-02 DIAGNOSIS — J439 Emphysema, unspecified: Secondary | ICD-10-CM

## 2017-03-02 DIAGNOSIS — J449 Chronic obstructive pulmonary disease, unspecified: Secondary | ICD-10-CM | POA: Diagnosis not present

## 2017-03-02 MED ORDER — PREDNISONE 20 MG PO TABS: 40.0000 mg | ORAL_TABLET | Freq: Every day | ORAL | 0 refills | Status: DC

## 2017-03-02 MED ORDER — DOXYCYCLINE HYCLATE 100 MG PO TABS: 100.0000 mg | ORAL_TABLET | Freq: Two times a day (BID) | ORAL | 0 refills | Status: DC

## 2017-03-02 MED ORDER — HYDROCODONE-HOMATROPINE 5-1.5 MG/5ML PO SYRP: 5.0000 mL | ORAL_SOLUTION | Freq: Every evening | ORAL | 0 refills | Status: DC | PRN

## 2017-03-02 MED ORDER — AMBULATORY NON FORMULARY MEDICATION: 0 refills | Status: DC

## 2017-03-02 NOTE — Progress Notes (Signed)
Subjective:    CC:   HPI:  Hypertension- Pt denies chest pain, SOB, dizziness, or heart palpitations.  Taking meds as directed w/o problems.  Denies medication side effects.    F/U COPD - He had a COPD exacerbation on 02/16/16 and is better but still has some chest congestion.  The cough is keeping him awake at night and he still little bit short of breath during the day.  No fevers chills or sweats.  He says overall he is better than he was but still has this persistent nagging cough.  He says he just is not getting any sleep.  He says the phlegm is bright yellow.  He did complete the azithromycin on the prednisone as well as the cough syrup.  Has a skin lesion on the top of his right foot that he would like me to look at today.  He says it looks similar to the lesion that he had removed by dermatology on his left forearm.  He says is been there for at least several months.  Says sometimes it will get kind of scabby and then peel off and then it always comes back.  Past medical history, Surgical history, Family history not pertinant except as noted below, Social history, Allergies, and medications have been entered into the medical record, reviewed, and corrections made.   Review of Systems: No fevers, chills, night sweats, weight loss, chest pain, or shortness of breath.   Objective:    General: Well Developed, well nourished, and in no acute distress.  Neuro: Alert and oriented x3, extra-ocular muscles intact, sensation grossly intact.  HEENT: Normocephalic, atraumatic  Skin: Warm and dry, no rashes.  He has a 5 x 6 mm pink/purple nodular skin lesion on the top of the right foot. Cardiac: Regular rate and rhythm, no murmurs rubs or gallops, no lower extremity edema.  Respiratory: Clear to auscultation bilaterally. Not using accessory muscles, speaking in full sentences.   Impression and Recommendations:    COPD -actually feel like he still on a COPD exacerbation.  Even though his  symptoms are better he still having shortness of breath persistent cough with bright yellow mucus.  We will go ahead and treat with doxycycline and another round of prednisone.  HTN - Well controlled. Continue current regimen. Follow up in  4 months.    Skin lesion on foot-refer to dermatology for removal.  Concerning for basal cell skin cancer.

## 2017-03-08 ENCOUNTER — Inpatient Hospital Stay: Admission: RE | Admit: 2017-03-08 | Payer: Medicare PPO | Source: Ambulatory Visit

## 2017-03-16 ENCOUNTER — Ambulatory Visit
Admission: RE | Admit: 2017-03-16 | Discharge: 2017-03-16 | Disposition: A | Discharge status: Home / Self Care | Payer: Medicare PPO | Source: Ambulatory Visit | Attending: Sports Medicine | Admitting: Sports Medicine

## 2017-03-16 MED ORDER — METHYLPREDNISOLONE ACETATE 40 MG/ML INJ SUSP (RADIOLOG
120.0000 mg | Freq: Once | INTRAMUSCULAR | Status: AC
  Administered 2017-03-16: 120 mg via EPIDURAL

## 2017-03-16 MED ORDER — IOPAMIDOL (ISOVUE-M 200) INJECTION 41%
1.0000 mL | Freq: Once | INTRAMUSCULAR | Status: AC
  Administered 2017-03-16: 1 mL via EPIDURAL

## 2017-03-16 NOTE — Discharge Instructions (Signed)

## 2017-03-22 ENCOUNTER — Other Ambulatory Visit: Payer: Self-pay | Admitting: Cardiovascular Disease

## 2017-03-26 ENCOUNTER — Other Ambulatory Visit: Payer: Self-pay | Admitting: Cardiology

## 2017-03-27 ENCOUNTER — Ambulatory Visit: Payer: Medicare PPO | Admitting: Sports Medicine

## 2017-03-27 NOTE — Telephone Encounter (Signed)
REFILL 

## 2017-03-28 ENCOUNTER — Other Ambulatory Visit: Payer: Self-pay | Admitting: Cardiology

## 2017-03-28 ENCOUNTER — Ambulatory Visit (INDEPENDENT_AMBULATORY_CARE_PROVIDER_SITE_OTHER): Payer: Medicare PPO | Admitting: Sports Medicine

## 2017-03-28 ENCOUNTER — Encounter: Payer: Self-pay | Admitting: Sports Medicine

## 2017-03-28 DIAGNOSIS — M51369 Other intervertebral disc degeneration, lumbar region without mention of lumbar back pain or lower extremity pain: Secondary | ICD-10-CM

## 2017-03-28 DIAGNOSIS — M5136 Other intervertebral disc degeneration, lumbar region: Secondary | ICD-10-CM | POA: Diagnosis not present

## 2017-03-28 NOTE — Progress Notes (Signed)
Subjective:    CC: Follow-up after epidural  HPI: This is a pleasant 84 year old male, he has left L5 radiculopathy, he is post L4-L5 microdiscectomy with good decompression at the L4-L5 level.  He did have a residual broad-based protruding disc at L5-S1 coming close to the left intraspinal L5 nerve root, we proceeded with a left L5-S1 transforaminal epidural, he had several days of complete relief, and now has partial relief.  He does however tell me that the procedure was somewhat painful and itself and he does not want to proceed with another epidural.  Not yet at least.  I reviewed the past medical history, family history, social history, surgical history, and allergies today and no changes were needed.  Please see the problem list section below in epic for further details.  Past Medical History: Past Medical History:  Diagnosis Date  . BPH (benign prostatic hyperplasia)   . CAD (coronary artery disease)    a. s/p CABG x1 with LIMA-LAD 1994. b. LM & 3v CAD by cath 07/2012  . CHF (congestive heart failure) ([place])    a. EF 35-40% by echo 07/2012.  [name] Complication of anesthesia    affected patients memory. Pt stated "I couldn't remember anything for three months...just bits and pieces"  . Emphysema    a. Moderate emphysema by CT 06/2012.  [name] Heart attack ([place]) 1994  . Hyperlipidemia   . Hypertension   . Hypoparathyroidism ([place])   . NSVT (nonsustained ventricular tachycardia) ([place])    a. Seen on tele 07/2012.  . OSA on CPAP    pt stated he does not wear CPAP because he no longer has sleep apnea  . Pneumonia   . Pseudoaneurysm of left femoral artery ([place])    a. post cath s/p compression 07/2012, associated w/ anemia.  . Sciatica 2012   Qualifier: Diagnosis of  By: Madilyn Fireman MD, Barnetta Chapel     Past Surgical History: Past Surgical History:  Procedure Laterality Date  . CATARACT EXTRACTION, BILATERAL    . COLONOSCOPY    . CORONARY ARTERY BYPASS GRAFT  03-23-92  . CORONARY ARTERY BYPASS  GRAFT N/A 08/14/2012   Procedure: REDO CORONARY ARTERY BYPASS GRAFTING (CABG);  Surgeon: Gaye Pollack, MD;  Location: [facility];  Service: Open Heart Surgery;  Laterality: N/A;  . INTRAOPERATIVE TRANSESOPHAGEAL ECHOCARDIOGRAM N/A 08/14/2012   Procedure: INTRAOPERATIVE TRANSESOPHAGEAL ECHOCARDIOGRAM;  Surgeon: Gaye Pollack, MD;  Location: [facility];  Service: Open Heart Surgery;  Laterality: N/A;  . LUMBAR LAMINECTOMY/DECOMPRESSION MICRODISCECTOMY Left 02/18/2015   Procedure: LUMBAR LAMINECTOMY/DECOMPRESSION MICRODISCECTOMY- LEFT FOUR-FIVE ;  Surgeon: Ashok Pall, MD;  Location: [facility];  Service: Neurosurgery;  Laterality: Left;   Social History: Social History   Socioeconomic History  . Marital status: Married    Spouse name: None  . Number of children: None  . Years of education: None  . Highest education level: None  Social Needs  . Financial resource strain: None  . Food insecurity - worry: None  . Food insecurity - inability: None  . Transportation needs - medical: None  . Transportation needs - non-medical: None  Occupational History  . None  Tobacco Use  . Smoking status: Current Some Day Smoker    Packs/day: 1.00    Years: 60.00    Pack years: 60.00    Last attempt to quit: 10/08/2010    Years since quitting: 6.4  . Smokeless tobacco: Never Used  Substance and Sexual Activity  . Alcohol use: Yes    Comment: Occasional  .  Drug use: No  . Sexual activity: None    Comment: works part-time, Conservation officer, [employer] completed H, married, no children, regular exercise.S  Other Topics Concern  . None  Social History Narrative  . None   Family History: Family History  Problem Relation Age of Onset  . Heart attack Father 34  . Stroke Brother 22  . Hypertension Brother   . Hyperlipidemia Brother    Allergies: Allergies  Allergen Reactions  . Hctz [Hydrochlorothiazide] Other (See Comments)    Affected renal function.    Medications: See med rec.  Review of Systems: No  fevers, chills, night sweats, weight loss, chest pain, or shortness of breath.   Objective:    General: Well Developed, well nourished, and in no acute distress.  Neuro: Alert and oriented x3, extra-ocular muscles intact, sensation grossly intact.  HEENT: Normocephalic, atraumatic, pupils equal round reactive to light, neck supple, no masses, no lymphadenopathy, thyroid nonpalpable.  Skin: Warm and dry, no rashes. Cardiac: Regular rate and rhythm, no murmurs rubs or gallops, no lower extremity edema.  Respiratory: Clear to auscultation bilaterally. Not using accessory muscles, speaking in full sentences.  Impression and Recommendations:    Lumbar degenerative disc disease Post L4-L5 laminectomy in 2017, good decompression at the L4-L5 level on the recent MRI, he did have some left-sided L5 distribution radicular pain, we proceeded with a left L5-S1 transforaminal epidural that created concordant pain and radicular symptoms during the injection. He had near complete pain relief for several days to a week, and then had a slight recurrence of pain. At this point it is not hurting enough to consider another epidural. I did tell him if it becomes time to do another shot we can do some Valium preprocedurally to ease his procedural discomfort. Declines physical therapy, adding another bit of home rehab exercises. Return as needed. ___________________________________________ Gwen Her. Dianah Field, M.D., ABFM., CAQSM. Primary Care and [place] Instructor of [employer] of [facility] of Medicine

## 2017-03-28 NOTE — Telephone Encounter (Signed)
Rx(s) sent to pharmacy electronically.  

## 2017-03-28 NOTE — Assessment & Plan Note (Signed)
Post L4-L5 laminectomy in 2017, good decompression at the L4-L5 level on the recent MRI, he did have some left-sided L5 distribution radicular pain, we proceeded with a left L5-S1 transforaminal epidural that created concordant pain and radicular symptoms during the injection. He had near complete pain relief for several days to a week, and then had a slight recurrence of pain. At this point it is not hurting enough to consider another epidural. I did tell him if it becomes time to do another shot we can do some Valium preprocedurally to ease his procedural discomfort. Declines physical therapy, adding another bit of home rehab exercises. Return as needed.

## 2017-04-09 ENCOUNTER — Other Ambulatory Visit: Payer: Self-pay | Admitting: Cardiovascular Disease

## 2017-04-10 NOTE — Telephone Encounter (Signed)
REFILL 

## 2017-04-25 ENCOUNTER — Telehealth: Payer: Self-pay | Admitting: Cardiology

## 2017-04-25 MED ORDER — CARVEDILOL 12.5 MG PO TABS: 12.5000 mg | ORAL_TABLET | Freq: Two times a day (BID) | ORAL | 0 refills | Status: DC

## 2017-04-25 MED ORDER — ROSUVASTATIN CALCIUM 40 MG PO TABS: 40.0000 mg | ORAL_TABLET | Freq: Every day | ORAL | 0 refills | Status: DC

## 2017-04-25 NOTE — Telephone Encounter (Signed)
°*  STAT* If patient is at the pharmacy, call can be transferred to refill team.   1. Which medications need to be refilled? (please list name of each medication and dose if known) Rosuvastatin 40 mg and Carvedilol 12.5 mg  2. Which pharmacy/location (including street and city if local pharmacy) is medication to be sent to?[facility],  - [address]  3. Do they need a 30 day or 90 day supply? [place]

## 2017-04-29 ENCOUNTER — Other Ambulatory Visit: Payer: Self-pay | Admitting: Cardiovascular Disease

## 2017-04-29 ENCOUNTER — Other Ambulatory Visit: Payer: Self-pay | Admitting: Family Medicine

## 2017-04-29 DIAGNOSIS — J441 Chronic obstructive pulmonary disease with (acute) exacerbation: Secondary | ICD-10-CM

## 2017-05-04 ENCOUNTER — Other Ambulatory Visit: Payer: Self-pay | Admitting: Sports Medicine

## 2017-05-04 DIAGNOSIS — M7061 Trochanteric bursitis, right hip: Secondary | ICD-10-CM

## 2017-05-04 DIAGNOSIS — M7062 Trochanteric bursitis, left hip: Principal | ICD-10-CM

## 2017-05-07 ENCOUNTER — Other Ambulatory Visit: Payer: Self-pay | Admitting: Cardiovascular Disease

## 2017-05-08 NOTE — Telephone Encounter (Signed)
REFILL 

## 2017-06-02 ENCOUNTER — Other Ambulatory Visit: Payer: Self-pay | Admitting: Cardiovascular Disease

## 2017-06-12 ENCOUNTER — Telehealth: Payer: Self-pay | Admitting: Cardiology

## 2017-06-12 MED ORDER — LOSARTAN POTASSIUM 50 MG PO TABS: 50.0000 mg | ORAL_TABLET | Freq: Every day | ORAL | 1 refills | Status: DC

## 2017-06-12 NOTE — Telephone Encounter (Signed)
New Message    *STAT* If patient is at the pharmacy, call can be transferred to refill team.   1. Which medications need to be refilled? (please list name of each medication and dose if known) losartan (COZAAR) 50 MG tablet  2. Which pharmacy/location (including street and city if local pharmacy) is medication to be sent to?  [facility] [place] - [address]  3. Do they need a 30 day or 90 day supply? [place]

## 2017-06-29 ENCOUNTER — Other Ambulatory Visit: Payer: Self-pay | Admitting: Cardiology

## 2017-06-29 ENCOUNTER — Other Ambulatory Visit: Payer: Self-pay | Admitting: Cardiovascular Disease

## 2017-06-30 ENCOUNTER — Ambulatory Visit: Payer: Medicare PPO | Admitting: Family Medicine

## 2017-06-30 NOTE — Progress Notes (Signed)
HPI: FU CHF and CAD. Patient had a myocardial infarction followed by coronary artery bypassing graft in 1994 in [place]. Cardiac catheterization in June of 2014 showed a 90% left main, occluded LAD, normal ramus, occluded circumflex and occluded right coronary artery. The LIMA to the LAD was patent. Ejection fraction was 35-40%. The patient underwent redo coronary artery bypass and graft in July of 2014 with a saphenous vein graft to the ramus and a saphenous vein graft to the PDA. ABIs October 2016 showed moderate insufficiency on the right and normal left.  Echo repeated February 2018 and showed ejection fraction 17-49%, grade 1 diastolic dysfunction, moderately reduced RV function and mild tricuspid regurgitation.  Abdominal ultrasound June 2018 showed 4.1 cm aneurysm.  Carotid Dopplers June 2018 showed less than 50% bilateral stenosis.  Since I last saw him,  he has dyspnea on exertion unchanged.  No orthopnea or PND.  He has developed mild pedal edema.  No chest pain or syncope.  Current Outpatient Medications  Medication Sig Dispense Refill  . acetaminophen (TYLENOL) 500 MG tablet Take 1-2 tablets (500-1,000 mg total) by mouth every 6 (six) hours as needed for pain. 30 tablet 0  . albuterol (PROVENTIL HFA;VENTOLIN HFA) 108 (90 Base) MCG/ACT inhaler Inhale 2 puffs into the lungs every 6 (six) hours as needed for wheezing or shortness of breath. 1 Inhaler 2  . AMBULATORY NON FORMULARY MEDICATION Medication Name: Tdap 1x IM 1 each 0  . Ascorbic Acid (VITAMIN C) 1000 MG tablet Take 1,000 mg by mouth daily.    [name] aspirin 81 MG tablet Take 81 mg by mouth daily.    . carvedilol (COREG) 12.5 MG tablet TAKE 1 TABLET (12.5 MG TOTAL) BY MOUTH 2 (TWO) TIMES DAILY WITH A MEAL. 120 tablet 0  . celecoxib (CELEBREX) 200 MG capsule TAKE 1 TO 2 CAPSULES BY MOUTH EVERY DAY AS NEEDED FOR PAIN 60 capsule 2  . Cholecalciferol (VITAMIN D PO) Take 3,000 mg by mouth daily.    . Cyanocobalamin (VITAMIN B-12 PO)  Take 1 tablet by mouth daily.    . finasteride (PROSCAR) 5 MG tablet TAKE ONE TABLET BY MOUTH ONE TIME DAILY 90 tablet 1  . furosemide (LASIX) 40 MG tablet TAKE 1 TABLET BY MOUTH EVERY DAY 30 tablet 0  . ipratropium-albuterol (DUONEB) 0.5-2.5 (3) MG/3ML SOLN INHALE 1 VIAL (3ML) VIA NEBULIZER EVERY 2 HOURS AS NEEDED FOR WHEEZE OR SHORTNESS OF BREATH 90 mL 1  . losartan (COZAAR) 50 MG tablet Take 1 tablet (50 mg total) by mouth daily. 90 tablet 1  . Multiple Vitamin (MULTIVITAMIN) capsule Take 1 capsule by mouth daily.    . Nebulizer MISC Generic nebulizer plus supplies 1 each 0  . Probiotic Product (TRUBIOTICS PO) Take 1 tablet by mouth daily.    . rosuvastatin (CRESTOR) 40 MG tablet TAKE 1 TABLET (40 MG TOTAL) BY MOUTH DAILY. MUST KEEP SCHEDULED APPT FOR ADDITIONAL REFILLS 60 tablet 0  . SYMBICORT 160-4.5 MCG/ACT inhaler INHALE TWO PUFFS BY MOUTH TWICE DAILY 10.2 Inhaler 6   No current facility-administered medications for this visit.      Past Medical History:  Diagnosis Date  . BPH (benign prostatic hyperplasia)   . CAD (coronary artery disease)    a. s/p CABG x1 with LIMA-LAD 1994. b. LM & 3v CAD by cath 07/2012  . CHF (congestive heart failure) ([place])    a. EF 35-40% by echo 07/2012.  [name] Complication of anesthesia    affected  patients memory. Pt stated "I couldn't remember anything for three months...just bits and pieces"  . Emphysema    a. Moderate emphysema by CT 06/2012.  [name] Heart attack ([place]) 1994  . Hyperlipidemia   . Hypertension   . Hypoparathyroidism ([place])   . NSVT (nonsustained ventricular tachycardia) ([place])    a. Seen on tele 07/2012.  . OSA on CPAP    pt stated he does not wear CPAP because he no longer has sleep apnea  . Pneumonia   . Pseudoaneurysm of left femoral artery ([place])    a. post cath s/p compression 07/2012, associated w/ anemia.  . Sciatica 2012   Qualifier: Diagnosis of  By: Madilyn Fireman MD, Barnetta Chapel      Past Surgical History:  Procedure Laterality Date  .  CATARACT EXTRACTION, BILATERAL    . COLONOSCOPY    . CORONARY ARTERY BYPASS GRAFT  03-23-92  . CORONARY ARTERY BYPASS GRAFT N/A 08/14/2012   Procedure: REDO CORONARY ARTERY BYPASS GRAFTING (CABG);  Surgeon: Gaye Pollack, MD;  Location: [facility];  Service: Open Heart Surgery;  Laterality: N/A;  . INTRAOPERATIVE TRANSESOPHAGEAL ECHOCARDIOGRAM N/A 08/14/2012   Procedure: INTRAOPERATIVE TRANSESOPHAGEAL ECHOCARDIOGRAM;  Surgeon: Gaye Pollack, MD;  Location: [facility];  Service: Open Heart Surgery;  Laterality: N/A;  . LUMBAR LAMINECTOMY/DECOMPRESSION MICRODISCECTOMY Left 02/18/2015   Procedure: LUMBAR LAMINECTOMY/DECOMPRESSION MICRODISCECTOMY- LEFT FOUR-FIVE ;  Surgeon: Ashok Pall, MD;  Location: [facility];  Service: Neurosurgery;  Laterality: Left;    Social History   Socioeconomic History  . Marital status: Married    Spouse name: Not on file  . Number of children: Not on file  . Years of education: Not on file  . Highest education level: Not on file  Occupational History  . Not on file  Social Needs  . Financial resource strain: Not on file  . Food insecurity:    Worry: Not on file    Inability: Not on file  . Transportation needs:    Medical: Not on file    Non-medical: Not on file  Tobacco Use  . Smoking status: Current Some Day Smoker    Packs/day: 1.00    Years: 60.00    Pack years: 60.00    Last attempt to quit: 10/08/2010    Years since quitting: 6.7  . Smokeless tobacco: Never Used  Substance and Sexual Activity  . Alcohol use: Yes    Comment: Occasional  . Drug use: No  . Sexual activity: Not on file    Comment: works part-time, Conservation officer, [employer] completed H, married, no children, regular exercise.S  Lifestyle  . Physical activity:    Days per week: Not on file    Minutes per session: Not on file  . Stress: Not on file  Relationships  . Social connections:    Talks on phone: Not on file    Gets together: Not on file    Attends religious service: Not on file     Active member of club or organization: Not on file    Attends meetings of clubs or organizations: Not on file    Relationship status: Not on file  . Intimate partner violence:    Fear of current or ex partner: Not on file    Emotionally abused: Not on file    Physically abused: Not on file    Forced sexual activity: Not on file  Other Topics Concern  . Not on file  Social History Narrative  . Not on file  Family History  Problem Relation Age of Onset  . Heart attack Father 77  . Stroke Brother 77  . Hypertension Brother   . Hyperlipidemia Brother     ROS: Back pain but no fevers or chills, productive cough, hemoptysis, dysphasia, odynophagia, melena, hematochezia, dysuria, hematuria, rash, seizure activity, orthopnea, PND, claudication. Remaining systems are negative.  Physical Exam: Well-developed well-nourished in no acute distress.  Skin is warm and dry.  HEENT is normal.  Neck is supple.  Chest diminished breath sounds throughout Cardiovascular exam is regular rate and rhythm.  Abdominal exam nontender or distended. No masses palpated. Extremities show 1+ edema. neuro grossly intact  ECG-normal sinus rhythm at a rate of 70.  Right bundle branch block.  Personally reviewed  A/P  1 coronary artery disease status post coronary artery bypass and graft-patient is doing well with no chest pain.  Plan to continue medical therapy including aspirin and statin.  2 ischemic cardiomyopathy-continue ARB and beta-blocker.  Patient notes some increased pedal edema.  I have asked him to continue low-sodium diet and fluid restrict to 1 L daily.  Continue present dose of Lasix.  We can increase in the future if edema worsens.  3 abdominal aortic aneurysm-we will arrange follow-up ultrasound 6/19.  4 carotid artery disease-continue aspirin and statin.  5 hypertension-blood pressure is controlled.  Continue present medications.  6 hyperlipidemia-continue statin.  Check lipids.   Recent liver functions normal.  Kirk Ruths, MD

## 2017-07-04 ENCOUNTER — Encounter: Payer: Self-pay | Admitting: Family Medicine

## 2017-07-04 ENCOUNTER — Telehealth: Payer: Self-pay | Admitting: *Deleted

## 2017-07-04 ENCOUNTER — Ambulatory Visit (INDEPENDENT_AMBULATORY_CARE_PROVIDER_SITE_OTHER): Payer: Medicare PPO

## 2017-07-04 ENCOUNTER — Ambulatory Visit (INDEPENDENT_AMBULATORY_CARE_PROVIDER_SITE_OTHER): Payer: Medicare PPO | Admitting: Family Medicine

## 2017-07-04 VITALS — BP 132/63 | HR 76 | Ht 68.0 in | Wt 230.0 lb

## 2017-07-04 DIAGNOSIS — I5022 Chronic systolic (congestive) heart failure: Secondary | ICD-10-CM

## 2017-07-04 DIAGNOSIS — I10 Essential (primary) hypertension: Secondary | ICD-10-CM

## 2017-07-04 DIAGNOSIS — J449 Chronic obstructive pulmonary disease, unspecified: Secondary | ICD-10-CM

## 2017-07-04 DIAGNOSIS — R7301 Impaired fasting glucose: Secondary | ICD-10-CM | POA: Diagnosis not present

## 2017-07-04 DIAGNOSIS — R0602 Shortness of breath: Secondary | ICD-10-CM | POA: Diagnosis not present

## 2017-07-04 DIAGNOSIS — R600 Localized edema: Secondary | ICD-10-CM | POA: Diagnosis not present

## 2017-07-04 DIAGNOSIS — R10824 Left lower quadrant rebound abdominal tenderness: Secondary | ICD-10-CM

## 2017-07-04 DIAGNOSIS — J984 Other disorders of lung: Secondary | ICD-10-CM | POA: Diagnosis not present

## 2017-07-04 DIAGNOSIS — J439 Emphysema, unspecified: Secondary | ICD-10-CM

## 2017-07-04 LAB — POCT GLYCOSYLATED HEMOGLOBIN (HGB A1C): Hemoglobin A1C: 5.7 % — AB (ref 4.0–5.6)

## 2017-07-04 NOTE — Telephone Encounter (Signed)
Is he doing 1 or 2 pills daily?  If he is doing the full dose of 2 pills daily we can switch him to [place].  Let me know.

## 2017-07-04 NOTE — Telephone Encounter (Signed)
Pt was here for a OV with PCP today and stated that the Celebrex that Dr. Darene Lamer had prescribed to him is not working to control his pain.   I told him that I would fwd this to Dr. Darene Lamer for advice.[name], [place]

## 2017-07-04 NOTE — Patient Instructions (Signed)
Weight yourself daily on home scale starting with today.  Double your Lasix for the next couple of days and see if this helps reduce her fluid.  Give [place] a call after couple days and let me know if it seems to be helping.

## 2017-07-04 NOTE — Progress Notes (Signed)
Subjective:    CC: HTN, COPD  HPI:  Hypertension- Pt denies chest pain, SOB, dizziness, or heart palpitations.  Taking meds as directed w/o problems.  Denies medication side effects.  He does complain of some lower extremity edema that is been going on for about 1 to 2 months.  Is better in the morning and worse by the end of the day.  No known triggers.  No major changes in diet or medications.   F/U COPD -he has been short of breath but says it is no more than usual.  Does feel better after he does not albuterol treatment.  Coronary artery disease-he is due for follow-up with cardiology.  He is on carvedilol and Crestor.  Impaired fasting glucose-no increased thirst or urination. No symptoms consistent with hypoglycemia.  He also complains that he is been having some belly aches on and off for the last 1 or 2 months.  He says it can just be a sharp pain or most like a belly cramp and sometimes it can last 10 to 15 minutes.  It is usually around the bellybutton area.  No nausea vomiting or diarrhea with it.  No blood in the stool.  No change in bowel movements.  In fact he says he is very regular he goes every day.  Past medical history, Surgical history, Family history not pertinant except as noted below, Social history, Allergies, and medications have been entered into the medical record, reviewed, and corrections made.   Review of Systems: No fevers, chills, night sweats, weight loss, chest pain, or shortness of breath.   Objective:    General: Well Developed, well nourished, and in no acute distress.  Neuro: Alert and oriented x3, extra-ocular muscles intact, sensation grossly intact.  HEENT: Normocephalic, atraumatic  Skin: Warm and dry, no rashes. Cardiac: Regular rate and rhythm, no murmurs rubs or gallops, no lower extremity edema.  Respiratory: Clear to auscultation bilaterally. Not using accessory muscles, speaking in full sentences. MSK: 1+ pitting edema up to mid tibia with  some trace edema up to the knees bilaterally.  Significant erythema. Abd: Normal bowel sounds.  He also has significant tenderness in the left side of the abdomen in the left lower quadrant with a little bit of guarding.  No rebound tenderness.  No palpable mass or fullness.   Impression and Recommendations:    HTN - Well controlled. Continue current regimen. Follow up in  6 months.    COPD - SOB -will get chest x-ray.  Will call with results once available.  May be also related to recent onset of lower extremity edema that he does not feel is anywhere but in his feet.  IFG -control.  Heme globin A1c 5.7 today which is fantastic.  Plan to recheck again in 6 to 12 months.  CAD -he has a follow-up tomorrow with cardiology.  Extremity swelling-I am very concerned about this.  Evaluate for changes in renal function, thyroid disease, and elevated liver enzymes.  He will go to the lab today.  On the short-term we will have him double up his Lasix for the next couple of days and he will weigh himself daily on his home scale.  Left lower quadrant pain/abdominal pain/cramping-unclear etiology.  If his pain was more persistent or if he were having fevers or change in bowel movements I would have a higher consideration for diverticulitis.  We will start with blood work if his white blood cell count is mildly elevated then will consider further  work-up with CT abdomen pelvis.  Time spent 45 minutes, greater than 50% time spent counseling about hypertension, COPD, impaired fasting glucose, coronary artery disease, extremity swelling, and abdominal pain with cramping and left lower quadrant tenderness.

## 2017-07-05 ENCOUNTER — Ambulatory Visit: Payer: Medicare PPO | Admitting: Cardiology

## 2017-07-05 ENCOUNTER — Encounter: Payer: Self-pay | Admitting: Cardiology

## 2017-07-05 VITALS — BP 121/77 | HR 70 | Ht 68.0 in | Wt 224.1 lb

## 2017-07-05 DIAGNOSIS — I714 Abdominal aortic aneurysm, without rupture, unspecified: Secondary | ICD-10-CM

## 2017-07-05 DIAGNOSIS — I2510 Atherosclerotic heart disease of native coronary artery without angina pectoris: Secondary | ICD-10-CM

## 2017-07-05 DIAGNOSIS — I255 Ischemic cardiomyopathy: Secondary | ICD-10-CM

## 2017-07-05 DIAGNOSIS — I679 Cerebrovascular disease, unspecified: Secondary | ICD-10-CM | POA: Diagnosis not present

## 2017-07-05 LAB — CBC WITH DIFFERENTIAL/PLATELET
BASOS ABS: 20 {cells}/uL (ref 0–200)
BASOS PCT: 0.2 %
EOS ABS: 101 {cells}/uL (ref 15–500)
Eosinophils Relative: 1 %
HEMATOCRIT: 43.3 % (ref 38.5–50.0)
HEMOGLOBIN: 15 g/dL (ref 13.2–17.1)
LYMPHS ABS: 2464 {cells}/uL (ref 850–3900)
MCH: 30.7 pg (ref 27.0–33.0)
MCHC: 34.6 g/dL (ref 32.0–36.0)
MCV: 88.5 fL (ref 80.0–100.0)
MPV: 11.1 fL (ref 7.5–12.5)
Monocytes Relative: 10.6 %
NEUTROS ABS: 6444 {cells}/uL (ref 1500–7800)
Neutrophils Relative %: 63.8 %
Platelets: 179 10*3/uL (ref 140–400)
RBC: 4.89 10*6/uL (ref 4.20–5.80)
RDW: 12.1 % (ref 11.0–15.0)
Total Lymphocyte: 24.4 %
WBC: 10.1 10*3/uL (ref 3.8–10.8)
WBCMIX: 1071 {cells}/uL — AB (ref 200–950)

## 2017-07-05 LAB — AMYLASE: Amylase: 25 U/L (ref 21–101)

## 2017-07-05 LAB — MICROALBUMIN / CREATININE URINE RATIO
Creatinine, Urine: 118 mg/dL (ref 20–320)
MICROALB UR: 1.9 mg/dL
Microalb Creat Ratio: 16 mcg/mg creat (ref <30)

## 2017-07-05 LAB — LIPASE: Lipase: 87 U/L — ABNORMAL HIGH (ref 7–60)

## 2017-07-05 LAB — COMPLETE METABOLIC PANEL WITH GFR
AG RATIO: 2.2 (calc) (ref 1.0–2.5)
ALBUMIN MSPROF: 4.3 g/dL (ref 3.6–5.1)
ALKALINE PHOSPHATASE (APISO): 41 U/L (ref 40–115)
ALT: 13 U/L (ref 9–46)
AST: 16 U/L (ref 10–35)
BILIRUBIN TOTAL: 0.8 mg/dL (ref 0.2–1.2)
BUN: 24 mg/dL (ref 7–25)
CHLORIDE: 102 mmol/L (ref 98–110)
CO2: 32 mmol/L (ref 20–32)
Calcium: 10.3 mg/dL (ref 8.6–10.3)
Creat: 0.99 mg/dL (ref 0.70–1.11)
GFR, Est African American: 82 mL/min/{1.73_m2} (ref >=60)
GFR, Est Non African American: 71 mL/min/{1.73_m2} (ref >=60)
GLUCOSE: 87 mg/dL (ref 65–99)
Globulin: 2 g/dL (calc) (ref 1.9–3.7)
POTASSIUM: 4.7 mmol/L (ref 3.5–5.3)
Sodium: 140 mmol/L (ref 135–146)
Total Protein: 6.3 g/dL (ref 6.1–8.1)

## 2017-07-05 LAB — BRAIN NATRIURETIC PEPTIDE: Brain Natriuretic Peptide: 94 pg/mL (ref <100)

## 2017-07-05 LAB — TSH: TSH: 1.72 m[IU]/L (ref 0.40–4.50)

## 2017-07-05 MED ORDER — CARVEDILOL 12.5 MG PO TABS: 12.5000 mg | ORAL_TABLET | Freq: Two times a day (BID) | ORAL | 3 refills | Status: DC

## 2017-07-05 MED ORDER — LOSARTAN POTASSIUM 50 MG PO TABS: 50.0000 mg | ORAL_TABLET | Freq: Every day | ORAL | 3 refills | Status: DC

## 2017-07-05 MED ORDER — ROSUVASTATIN CALCIUM 40 MG PO TABS: 40.0000 mg | ORAL_TABLET | Freq: Every day | ORAL | 3 refills | Status: DC

## 2017-07-05 MED ORDER — FUROSEMIDE 40 MG PO TABS: 40.0000 mg | ORAL_TABLET | Freq: Every day | ORAL | 3 refills | Status: DC

## 2017-07-05 NOTE — Telephone Encounter (Signed)
Called pt back and he states was taking 1 tab of the Celebrex. Informed pt to increase to 2 Celebrex daily and see if this controls the pain and if not then let us know and Dr. Darene Lamer would call in a different med for him. KG LPN

## 2017-07-05 NOTE — Patient Instructions (Signed)
Medication Instructions:   Refill sent to the pharmacy electronically.   Labwork:  Your physician recommends that you return for lab work PRIOR TO EATING  Testing/Procedures:  Your physician has requested that you have an abdominal aorta duplex. During this test, an ultrasound is used to evaluate the aorta. Allow 30 minutes for this exam. Do not eat after midnight the day before and avoid carbonated beverages   Follow-Up:  Your physician wants you to follow-up in: [place] will receive a reminder letter in the mail two months in advance. If you don't receive a letter, please call our office to schedule the follow-up appointment.   If you need a refill on your cardiac medications before your next appointment, please call your pharmacy.

## 2017-07-11 DIAGNOSIS — I2510 Atherosclerotic heart disease of native coronary artery without angina pectoris: Secondary | ICD-10-CM | POA: Diagnosis not present

## 2017-07-11 LAB — LIPID PANEL
CHOLESTEROL: 104 mg/dL (ref <200)
HDL: 35 mg/dL — AB (ref >40)
LDL CHOLESTEROL (CALC): 46 mg/dL
Non-HDL Cholesterol (Calc): 69 mg/dL (calc) (ref <130)
Total CHOL/HDL Ratio: 3 (calc) (ref <5.0)
Triglycerides: 145 mg/dL (ref <150)

## 2017-07-12 ENCOUNTER — Encounter: Payer: Self-pay | Admitting: *Deleted

## 2017-07-20 ENCOUNTER — Encounter: Payer: Self-pay | Admitting: Family Medicine

## 2017-07-20 ENCOUNTER — Ambulatory Visit (INDEPENDENT_AMBULATORY_CARE_PROVIDER_SITE_OTHER): Payer: Medicare PPO | Admitting: Family Medicine

## 2017-07-20 VITALS — BP 137/74 | HR 69 | Ht 68.0 in | Wt 228.0 lb

## 2017-07-20 DIAGNOSIS — J449 Chronic obstructive pulmonary disease, unspecified: Secondary | ICD-10-CM

## 2017-07-20 DIAGNOSIS — R600 Localized edema: Secondary | ICD-10-CM | POA: Diagnosis not present

## 2017-07-20 DIAGNOSIS — I5022 Chronic systolic (congestive) heart failure: Secondary | ICD-10-CM

## 2017-07-20 DIAGNOSIS — J984 Other disorders of lung: Secondary | ICD-10-CM

## 2017-07-20 DIAGNOSIS — J439 Emphysema, unspecified: Principal | ICD-10-CM

## 2017-07-20 NOTE — Progress Notes (Signed)
Subjective:    CC:   HPI:  Follow up chronic bilat lower extremity edema-he did double up on his Lasix for about 3 days and then went back down to 1 tab daily.  He says ever since then it has kept the swelling under good control overall.  His weight is down.  He did not take his diuretic today because he said it would make him urinate so is can wait till he gets home.  Chronic systolic heart failure-stable.  Just saw Dr. Stanford Breed about 2 weeks ago and no changes to his medication regimen were made.  Follow-up mixed and obstructive lung disease-I noticed that he was pursing his lips breathing today.  He does check his oxygen at home and says it normally runs between 90 and 92%.  Not had any flares or exacerbations recently.  Past medical history, Surgical history, Family history not pertinant except as noted below, Social history, Allergies, and medications have been entered into the medical record, reviewed, and corrections made.   Review of Systems: No fevers, chills, night sweats, weight loss, chest pain, or shortness of breath.   Objective:    General: Well Developed, well nourished, and in no acute distress.  Neuro: Alert and oriented x3, extra-ocular muscles intact, sensation grossly intact.  HEENT: Normocephalic, atraumatic  Skin: Warm and dry, no rashes. Cardiac: Regular rate and rhythm, no murmurs rubs or gallops, no lower extremity edema.  Respiratory: Clear to auscultation bilaterally. Not using accessory muscles, speaking in full sentences. Legs: Some trace pitting edema of the ankles bilaterally.   Impression and Recommendations:   Chronic bilat lower ext edema - back down on 1 Lasix daily and doing well.  He does report that he sticks to a low-salt diet but does not necessarily fluid restrict. Pressure looks great today.  Chronic systolic heart failure-Dr. Stanford Breed.  Just seen.  No adjustments to his medication.  Mixed and obstructive lung disease-6-minute walk test  performed today.  Last breathing test was in 2015.  We will schedule to repeat spirometry in a couple of weeks.  He is well overdue and I am concerned about his pursing of his lips while just sitting here in chatting today.  We actually attempted to do a 6-minute walk test today but he was only able to walk for about 2 minutes and then he actually did drop down to about 89% but unfortunately started having pain in his hip and so felt like he needed to sit down and rest.  We will try to attempt to do the walk test again when he returns and encouraged him to bring a cane or walker particularly if his hip is bothering him that day.

## 2017-07-21 ENCOUNTER — Other Ambulatory Visit: Payer: Self-pay | Admitting: Family Medicine

## 2017-07-21 DIAGNOSIS — J441 Chronic obstructive pulmonary disease with (acute) exacerbation: Secondary | ICD-10-CM

## 2017-07-28 ENCOUNTER — Other Ambulatory Visit: Payer: Self-pay | Admitting: Sports Medicine

## 2017-07-28 DIAGNOSIS — M7062 Trochanteric bursitis, left hip: Principal | ICD-10-CM

## 2017-07-28 DIAGNOSIS — M7061 Trochanteric bursitis, right hip: Secondary | ICD-10-CM

## 2017-07-31 ENCOUNTER — Ambulatory Visit (HOSPITAL_BASED_OUTPATIENT_CLINIC_OR_DEPARTMENT_OTHER)
Admission: RE | Admit: 2017-07-31 | Discharge: 2017-07-31 | Disposition: A | Discharge status: Home / Self Care | Payer: Medicare PPO | Source: Ambulatory Visit | Attending: Cardiology | Admitting: Cardiology

## 2017-07-31 DIAGNOSIS — I714 Abdominal aortic aneurysm, without rupture, unspecified: Secondary | ICD-10-CM

## 2017-07-31 NOTE — Progress Notes (Signed)
  Vascular Ultrasound Abdominal aortic duplex has been completed.   Joelene Millin 07/31/2017, 9:14 AM

## 2017-08-01 ENCOUNTER — Other Ambulatory Visit: Payer: Self-pay | Admitting: *Deleted

## 2017-08-01 DIAGNOSIS — I714 Abdominal aortic aneurysm, without rupture, unspecified: Secondary | ICD-10-CM

## 2017-08-03 ENCOUNTER — Ambulatory Visit (INDEPENDENT_AMBULATORY_CARE_PROVIDER_SITE_OTHER): Payer: Medicare PPO | Admitting: Family Medicine

## 2017-08-03 VITALS — BP 124/61 | HR 80 | Ht 68.0 in | Wt 226.0 lb

## 2017-08-03 DIAGNOSIS — J984 Other disorders of lung: Secondary | ICD-10-CM

## 2017-08-03 DIAGNOSIS — J449 Chronic obstructive pulmonary disease, unspecified: Secondary | ICD-10-CM | POA: Diagnosis not present

## 2017-08-03 DIAGNOSIS — J439 Emphysema, unspecified: Principal | ICD-10-CM

## 2017-08-03 LAB — PULMONARY FUNCTION TEST

## 2017-08-03 MED ORDER — FLUTICASONE-UMECLIDIN-VILANT 100-62.5-25 MCG/INH IN AEPB: 1.0000 | INHALATION_SPRAY | Freq: Every day | RESPIRATORY_TRACT | 1 refills | Status: DC

## 2017-08-03 MED ORDER — ALBUTEROL SULFATE (2.5 MG/3ML) 0.083% IN NEBU
2.5000 mg | INHALATION_SOLUTION | Freq: Once | RESPIRATORY_TRACT | Status: AC
  Administered 2017-08-03: 2.5 mg via RESPIRATORY_TRACT

## 2017-08-03 NOTE — Progress Notes (Signed)
Subjective:    Patient ID: Joseph Berg, male    DOB: Apr 02, 1933, 84 y.o.   MRN: [id number]  HPI 84 year old male is here today for spirometry.  He has history of mixed restrictive and obstructive lung disease.  Last spirometry was in 2015 and we had not done one since then.  When he was here for his last office visit and noticed he was pursing his lips while breathing.  We attempted to do a 6-minute walk test but unfortunately was only adequate build to complete 2 minutes of the test because of hip and back pain.  And he dropped down to 89%.  We could not finish the test at the time.  I had asked him to bring in his cane or walker so that we could attempted to again today but he forgot and is still having significant pain that he feels will limit his ability to walk for 6 minutes.  He is currently on Symbicort and just picked up a 90-day refill.   Review of Systems BP 124/61   Pulse 80   Ht 5\' 8"  (1.727 m)   Wt 226 lb (102.5 kg)   SpO2 90%   BMI 34.36 kg/m     Allergies  Allergen Reactions  . Hydrochlorothiazide Other (See Comments)    Affected renal function.  "affected kidneys and calcium level"    Past Medical History:  Diagnosis Date  . BPH (benign prostatic hyperplasia)   . CAD (coronary artery disease)    a. s/p CABG x1 with LIMA-LAD 1994. b. LM & 3v CAD by cath 07/2012  . CHF (congestive heart failure) ([place])    a. EF 35-40% by echo 07/2012.  [name] Complication of anesthesia    affected patients memory. Pt stated "I couldn't remember anything for three months...just bits and pieces"  . Emphysema    a. Moderate emphysema by CT 06/2012.  [name] Heart attack ([place]) 1994  . Hyperlipidemia   . Hypertension   . Hypoparathyroidism ([place])   . NSVT (nonsustained ventricular tachycardia) ([place])    a. Seen on tele 07/2012.  . OSA on CPAP    pt stated he does not wear CPAP because he no longer has sleep apnea  . Pneumonia   . Pseudoaneurysm of left femoral artery ([place])    a. post  cath s/p compression 07/2012, associated w/ anemia.  . Sciatica 2012   Qualifier: Diagnosis of  By: Madilyn Fireman MD, Barnetta Chapel      Past Surgical History:  Procedure Laterality Date  . CATARACT EXTRACTION, BILATERAL    . COLONOSCOPY    . CORONARY ARTERY BYPASS GRAFT  03-23-92  . CORONARY ARTERY BYPASS GRAFT N/A 08/14/2012   Procedure: REDO CORONARY ARTERY BYPASS GRAFTING (CABG);  Surgeon: Gaye Pollack, MD;  Location: [employer];  Service: Open Heart Surgery;  Laterality: N/A;  . INTRAOPERATIVE TRANSESOPHAGEAL ECHOCARDIOGRAM N/A 08/14/2012   Procedure: INTRAOPERATIVE TRANSESOPHAGEAL ECHOCARDIOGRAM;  Surgeon: Gaye Pollack, MD;  Location: [facility];  Service: Open Heart Surgery;  Laterality: N/A;  . LUMBAR LAMINECTOMY/DECOMPRESSION MICRODISCECTOMY Left 02/18/2015   Procedure: LUMBAR LAMINECTOMY/DECOMPRESSION MICRODISCECTOMY- LEFT FOUR-FIVE ;  Surgeon: Ashok Pall, MD;  Location: [facility];  Service: Neurosurgery;  Laterality: Left;    Social History   Socioeconomic History  . Marital status: Married    Spouse name: Not on file  . Number of children: Not on file  . Years of education: Not on file  . Highest education level: Not on file  Occupational History  .  Not on file  Social Needs  . Financial resource strain: Not on file  . Food insecurity:    Worry: Not on file    Inability: Not on file  . Transportation needs:    Medical: Not on file    Non-medical: Not on file  Tobacco Use  . Smoking status: Current Some Day Smoker    Packs/day: 1.00    Years: 60.00    Pack years: 60.00    Last attempt to quit: 10/08/2010    Years since quitting: 6.8  . Smokeless tobacco: Never Used  Substance and Sexual Activity  . Alcohol use: Yes    Comment: Occasional  . Drug use: No  . Sexual activity: Not on file    Comment: works part-time, Conservation officer, [employer] completed H, married, no children, regular exercise.S  Lifestyle  . Physical activity:    Days per week: Not on file    Minutes per  session: Not on file  . Stress: Not on file  Relationships  . Social connections:    Talks on phone: Not on file    Gets together: Not on file    Attends religious service: Not on file    Active member of club or organization: Not on file    Attends meetings of clubs or organizations: Not on file    Relationship status: Not on file  . Intimate partner violence:    Fear of current or ex partner: Not on file    Emotionally abused: Not on file    Physically abused: Not on file    Forced sexual activity: Not on file  Other Topics Concern  . Not on file  Social History Narrative  . Not on file    Family History  Problem Relation Age of Onset  . Heart attack Father 67  . Stroke Brother 67  . Hypertension Brother   . Hyperlipidemia Brother     Outpatient Encounter Medications as of 08/03/2017  Medication Sig  . acetaminophen (TYLENOL) 500 MG tablet Take 1-2 tablets (500-1,000 mg total) by mouth every 6 (six) hours as needed for pain.  [name] albuterol (PROVENTIL HFA;VENTOLIN HFA) 108 (90 Base) MCG/ACT inhaler Inhale 2 puffs into the lungs every 6 (six) hours as needed for wheezing or shortness of breath.  . AMBULATORY NON FORMULARY MEDICATION Medication Name: Tdap 1x IM  . Ascorbic Acid (VITAMIN C) 1000 MG tablet Take 1,000 mg by mouth daily.  [name] aspirin 81 MG tablet Take 81 mg by mouth daily.  . carvedilol (COREG) 12.5 MG tablet Take 1 tablet (12.5 mg total) by mouth 2 (two) times daily with a meal.  . celecoxib (CELEBREX) 200 MG capsule TAKE 1 TO 2 CAPSULES BY MOUTH EVERY DAY AS NEEDED FOR PAIN  . Cholecalciferol (VITAMIN D PO) Take 3,000 mg by mouth daily.  . Cyanocobalamin (VITAMIN B-12 PO) Take 1 tablet by mouth daily.  . finasteride (PROSCAR) 5 MG tablet TAKE ONE TABLET BY MOUTH ONE TIME DAILY  . furosemide (LASIX) 40 MG tablet Take 1 tablet (40 mg total) by mouth daily.  [name] ipratropium-albuterol (DUONEB) 0.5-2.5 (3) MG/3ML SOLN INHALE 1 VIAL (3ML) VIA NEBULIZER EVERY 2 HOURS AS  NEEDED FOR WHEEZE OR SHORTNESS OF BREATH  . losartan (COZAAR) 50 MG tablet Take 1 tablet (50 mg total) by mouth daily.  . Multiple Vitamin (MULTIVITAMIN) capsule Take 1 capsule by mouth daily.  . Nebulizer MISC Generic nebulizer plus supplies  . Probiotic Product (TRUBIOTICS PO) Take 1 tablet by mouth daily.  [name]  rosuvastatin (CRESTOR) 40 MG tablet Take 1 tablet (40 mg total) by mouth daily.  . SYMBICORT 160-4.5 MCG/ACT inhaler INHALE TWO PUFFS BY MOUTH TWICE DAILY  . Fluticasone-Umeclidin-Vilant (TRELEGY ELLIPTA) 100-62.5-25 MCG/INH AEPB Inhale 1 Inhaler into the lungs daily.  . [EXPIRED] albuterol (PROVENTIL) (2.5 MG/3ML) 0.083% nebulizer solution 2.5 mg    No facility-administered encounter medications on file as of 08/03/2017.          Objective:   Physical Exam  Constitutional: He is oriented to person, place, and time. He appears well-developed and well-nourished.  HENT:  Head: Normocephalic and atraumatic.  Eyes: Conjunctivae and EOM are normal.  Cardiovascular: Normal rate.  Pulmonary/Chest: Effort normal.  Neurological: He is alert and oriented to person, place, and time.  Skin: Skin is dry. No pallor.  Psychiatric: He has a normal mood and affect. His behavior is normal.  Vitals reviewed.       Assessment & Plan:  Mixed restrictive and obstructive lung disease-spirometry shows FVC of 43% which is down from 51% back in 2015.  FEV1 of 34% which is down from 40% from previous.  Ratio 55%.  No significant improvement in FEV1.  He did have a +6% improvement.  We reviewed results today and discussed that I want to optimize his treatment and switch him from Symbicort to Trelegy so we can add a anticholinergic to help preserve lung function.  We also discussed the importance of weight loss and staying active even though he cannot exercise regularly.  I will see him back in 3 months and see how he is doing.  I would really like to repeat a walk test at that time if he is able.  Since  he just refill the Symbicort can alternate with the Trelegy if he would like.  Time spent 20 minutes, greater than 50% of the time spent counseling about the next lung disease.

## 2017-08-28 DIAGNOSIS — M9903 Segmental and somatic dysfunction of lumbar region: Secondary | ICD-10-CM | POA: Diagnosis not present

## 2017-08-28 DIAGNOSIS — M9902 Segmental and somatic dysfunction of thoracic region: Secondary | ICD-10-CM | POA: Diagnosis not present

## 2017-08-28 DIAGNOSIS — M5442 Lumbago with sciatica, left side: Secondary | ICD-10-CM | POA: Diagnosis not present

## 2017-08-28 DIAGNOSIS — M9904 Segmental and somatic dysfunction of sacral region: Secondary | ICD-10-CM | POA: Diagnosis not present

## 2017-08-28 DIAGNOSIS — M5136 Other intervertebral disc degeneration, lumbar region: Secondary | ICD-10-CM | POA: Diagnosis not present

## 2017-08-31 ENCOUNTER — Other Ambulatory Visit: Payer: Self-pay | Admitting: Cardiology

## 2017-08-31 NOTE — Telephone Encounter (Signed)
Rx request sent to pharmacy.  

## 2017-09-05 DIAGNOSIS — M5136 Other intervertebral disc degeneration, lumbar region: Secondary | ICD-10-CM | POA: Diagnosis not present

## 2017-09-05 DIAGNOSIS — M9904 Segmental and somatic dysfunction of sacral region: Secondary | ICD-10-CM | POA: Diagnosis not present

## 2017-09-05 DIAGNOSIS — M9902 Segmental and somatic dysfunction of thoracic region: Secondary | ICD-10-CM | POA: Diagnosis not present

## 2017-09-05 DIAGNOSIS — M9903 Segmental and somatic dysfunction of lumbar region: Secondary | ICD-10-CM | POA: Diagnosis not present

## 2017-09-05 DIAGNOSIS — M5442 Lumbago with sciatica, left side: Secondary | ICD-10-CM | POA: Diagnosis not present

## 2017-09-12 DIAGNOSIS — M9902 Segmental and somatic dysfunction of thoracic region: Secondary | ICD-10-CM | POA: Diagnosis not present

## 2017-09-12 DIAGNOSIS — M5136 Other intervertebral disc degeneration, lumbar region: Secondary | ICD-10-CM | POA: Diagnosis not present

## 2017-09-12 DIAGNOSIS — M5442 Lumbago with sciatica, left side: Secondary | ICD-10-CM | POA: Diagnosis not present

## 2017-09-12 DIAGNOSIS — M9903 Segmental and somatic dysfunction of lumbar region: Secondary | ICD-10-CM | POA: Diagnosis not present

## 2017-09-12 DIAGNOSIS — M9904 Segmental and somatic dysfunction of sacral region: Secondary | ICD-10-CM | POA: Diagnosis not present

## 2017-09-19 ENCOUNTER — Telehealth: Payer: Self-pay

## 2017-09-19 DIAGNOSIS — J441 Chronic obstructive pulmonary disease with (acute) exacerbation: Secondary | ICD-10-CM

## 2017-09-19 MED ORDER — AMBULATORY NON FORMULARY MEDICATION: 0 refills | Status: AC

## 2017-09-19 NOTE — Telephone Encounter (Signed)
Joseph Berg needs a new nebulizer machine. His wife is ok to pay for the nebulizer machine. They just need a prescription. She states she will come by tomorrow to pick up prescription.

## 2017-09-19 NOTE — Telephone Encounter (Signed)
Printed and placed in basket.

## 2017-09-19 NOTE — Telephone Encounter (Signed)
Okay to write prescription for nebulizer with supplies.  Diagnosis of COPD.

## 2017-10-07 ENCOUNTER — Other Ambulatory Visit: Payer: Self-pay | Admitting: Family Medicine

## 2017-11-03 ENCOUNTER — Ambulatory Visit (INDEPENDENT_AMBULATORY_CARE_PROVIDER_SITE_OTHER): Payer: Medicare PPO

## 2017-11-03 ENCOUNTER — Encounter: Payer: Self-pay | Admitting: Family Medicine

## 2017-11-03 ENCOUNTER — Ambulatory Visit (INDEPENDENT_AMBULATORY_CARE_PROVIDER_SITE_OTHER): Payer: Medicare PPO | Admitting: Family Medicine

## 2017-11-03 VITALS — BP 132/62 | HR 77 | Ht 68.0 in | Wt 226.0 lb

## 2017-11-03 DIAGNOSIS — M79605 Pain in left leg: Secondary | ICD-10-CM

## 2017-11-03 DIAGNOSIS — M25473 Effusion, unspecified ankle: Secondary | ICD-10-CM

## 2017-11-03 DIAGNOSIS — I10 Essential (primary) hypertension: Secondary | ICD-10-CM | POA: Diagnosis not present

## 2017-11-03 DIAGNOSIS — E348 Other specified endocrine disorders: Secondary | ICD-10-CM

## 2017-11-03 DIAGNOSIS — M79662 Pain in left lower leg: Secondary | ICD-10-CM | POA: Diagnosis not present

## 2017-11-03 LAB — POCT GLYCOSYLATED HEMOGLOBIN (HGB A1C): Hemoglobin A1C: 5.7 % — AB (ref 4.0–5.6)

## 2017-11-03 NOTE — Progress Notes (Signed)
Subjective:    Patient ID: Joseph Berg, male    DOB: 1933/05/07, 84 y.o.   MRN: [id number]  HPI Follow-up mixed restrictive and obstructive lung disease-we decided to start him on Trelegy after his spirometry 3 months ago.  He had just filled a recent per scription for Symbicort so I told him he could alternate prescriptions until he ran out.  He is now just on the Trelegy he is doing that once a day and then he is doing an albuterol nebulizer treatment first thing in the morning and the evening.  He actually feels like that regimen is working well for him.  Hypertension- Pt denies chest pain, SOB, dizziness, or heart palpitations.  Taking meds as directed w/o problems.  Denies medication side effects.    Impaired fasting glucose-no increased thirst or urination. No symptoms consistent with hypoglycemia. Lab Results  Component Value Date   HGBA1C 5.7 (A) 11/03/2017   He also complains of some left calf pain.  Is been going on for about the last 3 months.  He said it can get quite painful at times.  It does not feel like a charley horse or cramp.  He is also noticed bilateral ankle swelling this is been going on for months but at this point it does not seem to be going away.  In fact last time I saw him we decided to double up on his Lasix and he says he does urinate and he is noticed improvement in that but he says it does not really impact the swelling in his ankles at all.  Elevation does not improve it and even to going to bed at night and getting up in the morning does not improve the swelling.  It always seems to be worse on the left greater than the right which is the same leg that is painful.  He is been using salon pause on it and that does seem to help.  Review of Systems  BP 132/62   Pulse 77   Ht 5\' 8"  (1.727 m)   Wt 226 lb (102.5 kg)   SpO2 92%   BMI 34.36 kg/m     Allergies  Allergen Reactions  . Hydrochlorothiazide Other (See Comments)    Affected renal function.   "affected kidneys and calcium level"    Past Medical History:  Diagnosis Date  . BPH (benign prostatic hyperplasia)   . CAD (coronary artery disease)    a. s/p CABG x1 with LIMA-LAD 1994. b. LM & 3v CAD by cath 07/2012  . CHF (congestive heart failure) ([place])    a. EF 35-40% by echo 07/2012.  [name] Complication of anesthesia    affected patients memory. Pt stated "I couldn't remember anything for three months...just bits and pieces"  . Emphysema    a. Moderate emphysema by CT 06/2012.  [name] Heart attack ([place]) 1994  . Hyperlipidemia   . Hypertension   . Hypoparathyroidism ([place])   . NSVT (nonsustained ventricular tachycardia) ([place])    a. Seen on tele 07/2012.  . OSA on CPAP    pt stated he does not wear CPAP because he no longer has sleep apnea  . Pneumonia   . Pseudoaneurysm of left femoral artery ([place])    a. post cath s/p compression 07/2012, associated w/ anemia.  . Sciatica 2012   Qualifier: Diagnosis of  By: Madilyn Fireman MD, Barnetta Chapel      Past Surgical History:  Procedure Laterality Date  . CATARACT EXTRACTION, BILATERAL    .  COLONOSCOPY    . CORONARY ARTERY BYPASS GRAFT  03-23-92  . CORONARY ARTERY BYPASS GRAFT N/A 08/14/2012   Procedure: REDO CORONARY ARTERY BYPASS GRAFTING (CABG);  Surgeon: Gaye Pollack, MD;  Location: [facility];  Service: Open Heart Surgery;  Laterality: N/A;  . INTRAOPERATIVE TRANSESOPHAGEAL ECHOCARDIOGRAM N/A 08/14/2012   Procedure: INTRAOPERATIVE TRANSESOPHAGEAL ECHOCARDIOGRAM;  Surgeon: Gaye Pollack, MD;  Location: [facility];  Service: Open Heart Surgery;  Laterality: N/A;  . LUMBAR LAMINECTOMY/DECOMPRESSION MICRODISCECTOMY Left 02/18/2015   Procedure: LUMBAR LAMINECTOMY/DECOMPRESSION MICRODISCECTOMY- LEFT FOUR-FIVE ;  Surgeon: Ashok Pall, MD;  Location: [facility];  Service: Neurosurgery;  Laterality: Left;    Social History   Socioeconomic History  . Marital status: Married    Spouse name: Not on file  . Number of children: Not on file  . Years of education:  Not on file  . Highest education level: Not on file  Occupational History  . Not on file  Social Needs  . Financial resource strain: Not on file  . Food insecurity:    Worry: Not on file    Inability: Not on file  . Transportation needs:    Medical: Not on file    Non-medical: Not on file  Tobacco Use  . Smoking status: Current Some Day Smoker    Packs/day: 1.00    Years: 60.00    Pack years: 60.00    Last attempt to quit: 10/08/2010    Years since quitting: 7.0  . Smokeless tobacco: Never Used  Substance and Sexual Activity  . Alcohol use: Yes    Comment: Occasional  . Drug use: No  . Sexual activity: Not on file    Comment: works part-time, Conservation officer, [employer] completed H, married, no children, regular exercise.S  Lifestyle  . Physical activity:    Days per week: Not on file    Minutes per session: Not on file  . Stress: Not on file  Relationships  . Social connections:    Talks on phone: Not on file    Gets together: Not on file    Attends religious service: Not on file    Active member of club or organization: Not on file    Attends meetings of clubs or organizations: Not on file    Relationship status: Not on file  . Intimate partner violence:    Fear of current or ex partner: Not on file    Emotionally abused: Not on file    Physically abused: Not on file    Forced sexual activity: Not on file  Other Topics Concern  . Not on file  Social History Narrative  . Not on file    Family History  Problem Relation Age of Onset  . Heart attack Father 42  . Stroke Brother 85  . Hypertension Brother   . Hyperlipidemia Brother     Outpatient Encounter Medications as of 11/03/2017  Medication Sig  . acetaminophen (TYLENOL) 500 MG tablet Take 1-2 tablets (500-1,000 mg total) by mouth every 6 (six) hours as needed for pain.  [name] AMBULATORY NON FORMULARY MEDICATION Nebulizer - use as needed - with supplies.  Diagnosis of COPD J44.1  . Ascorbic Acid (VITAMIN C) 1000 MG  tablet Take 1,000 mg by mouth daily.  [name] aspirin 81 MG tablet Take 81 mg by mouth daily.  . carvedilol (COREG) 12.5 MG tablet Take 1 tablet (12.5 mg total) by mouth 2 (two) times daily with a meal.  . celecoxib (CELEBREX) 200 MG capsule TAKE 1  TO 2 CAPSULES BY MOUTH EVERY DAY AS NEEDED FOR PAIN  . Cholecalciferol (VITAMIN D PO) Take 3,000 mg by mouth daily.  . Cyanocobalamin (VITAMIN B-12 PO) Take 1 tablet by mouth daily.  . finasteride (PROSCAR) 5 MG tablet TAKE 1 TABLET BY MOUTH EVERY DAY  . Fluticasone-Umeclidin-Vilant (TRELEGY ELLIPTA) 100-62.5-25 MCG/INH AEPB Inhale 1 Inhaler into the lungs daily.  . furosemide (LASIX) 40 MG tablet Take 1 tablet (40 mg total) by mouth daily.  [name] ipratropium-albuterol (DUONEB) 0.5-2.5 (3) MG/3ML SOLN INHALE 1 VIAL (3ML) VIA NEBULIZER EVERY 2 HOURS AS NEEDED FOR WHEEZE OR SHORTNESS OF BREATH  . losartan (COZAAR) 50 MG tablet Take 1 tablet (50 mg total) by mouth daily.  . Multiple Vitamin (MULTIVITAMIN) capsule Take 1 capsule by mouth daily.  . Nebulizer MISC Generic nebulizer plus supplies  . Probiotic Product (TRUBIOTICS PO) Take 1 tablet by mouth daily.  . rosuvastatin (CRESTOR) 40 MG tablet Take 1 tablet (40 mg total) by mouth daily.  . VENTOLIN HFA 108 (90 Base) MCG/ACT inhaler TAKE 2 PUFFS BY MOUTH EVERY 6 HOURS AS NEEDED FOR WHEEZE OR SHORTNESS OF BREATH  . [DISCONTINUED] rosuvastatin (CRESTOR) 40 MG tablet TAKE 1 TABLET (40 MG TOTAL) BY MOUTH DAILY. MUST KEEP SCHEDULED APPT FOR ADDITIONAL REFILLS   No facility-administered encounter medications on file as of 11/03/2017.           Objective:   Physical Exam  Constitutional: He is oriented to person, place, and time. He appears well-developed and well-nourished.  HENT:  Head: Normocephalic and atraumatic.  Cardiovascular: Normal rate, regular rhythm and normal heart sounds.  Pulmonary/Chest: Effort normal and breath sounds normal.  Musculoskeletal:  bilat ankle edema that is pitting to mid leg.   Trace tibial edema to the knee.  nontender calf muscle very tender over the left tibia  Neurological: He is alert and oriented to person, place, and time.  Skin: Skin is warm and dry.  Psychiatric: He has a normal mood and affect. His behavior is normal.        Assessment & Plan:  Mixed restrictive and obstructive lung disease- stable. Tried to do a walk test but his hip started hurting so we had to stop the test early.  Oxygen dropped to 92%.    HTN - Well controlled. Continue current regimen. Follow up in  6 months.    IFG - Well controlled. Continue current regimen. Follow up in  6 months.    Left leg pain/calf pain-I like to get an x-ray since he is tender along the tibia he is actually not tender with calf squeeze.  Also consider DVT though his pain is been persistent for several months so it would be less likely but with the chronic swelling is certainly possible.  We will check a d-dimer level.

## 2017-11-04 LAB — COMPLETE METABOLIC PANEL WITH GFR
AG Ratio: 1.7 (calc) (ref 1.0–2.5)
ALBUMIN MSPROF: 4.1 g/dL (ref 3.6–5.1)
ALT: 12 U/L (ref 9–46)
AST: 18 U/L (ref 10–35)
Alkaline phosphatase (APISO): 51 U/L (ref 40–115)
BILIRUBIN TOTAL: 0.8 mg/dL (ref 0.2–1.2)
BUN: 19 mg/dL (ref 7–25)
CO2: 30 mmol/L (ref 20–32)
CREATININE: 0.95 mg/dL (ref 0.70–1.11)
Calcium: 10.6 mg/dL — ABNORMAL HIGH (ref 8.6–10.3)
Chloride: 99 mmol/L (ref 98–110)
GFR, EST AFRICAN AMERICAN: 86 mL/min/{1.73_m2} (ref >=60)
GFR, Est Non African American: 74 mL/min/{1.73_m2} (ref >=60)
GLUCOSE: 103 mg/dL — AB (ref 65–99)
Globulin: 2.4 g/dL (calc) (ref 1.9–3.7)
Potassium: 4.3 mmol/L (ref 3.5–5.3)
Sodium: 140 mmol/L (ref 135–146)
TOTAL PROTEIN: 6.5 g/dL (ref 6.1–8.1)

## 2017-11-04 LAB — TSH: TSH: 1.65 mIU/L (ref 0.40–4.50)

## 2017-11-04 LAB — D-DIMER, QUANTITATIVE: D-Dimer, Quant: 2.02 mcg/mL FEU — ABNORMAL HIGH (ref <0.50)

## 2017-11-05 ENCOUNTER — Other Ambulatory Visit: Payer: Self-pay | Admitting: Family Medicine

## 2017-11-06 ENCOUNTER — Other Ambulatory Visit: Payer: Self-pay | Admitting: *Deleted

## 2017-11-06 DIAGNOSIS — R7989 Other specified abnormal findings of blood chemistry: Secondary | ICD-10-CM

## 2017-11-06 DIAGNOSIS — M79662 Pain in left lower leg: Secondary | ICD-10-CM

## 2017-11-08 ENCOUNTER — Ambulatory Visit (HOSPITAL_BASED_OUTPATIENT_CLINIC_OR_DEPARTMENT_OTHER)
Admission: RE | Admit: 2017-11-08 | Discharge: 2017-11-08 | Disposition: A | Discharge status: Home / Self Care | Payer: Medicare PPO | Source: Ambulatory Visit | Attending: Family Medicine | Admitting: Family Medicine

## 2017-11-08 DIAGNOSIS — M79662 Pain in left lower leg: Secondary | ICD-10-CM

## 2017-11-08 DIAGNOSIS — R7989 Other specified abnormal findings of blood chemistry: Secondary | ICD-10-CM

## 2017-11-10 ENCOUNTER — Ambulatory Visit (HOSPITAL_BASED_OUTPATIENT_CLINIC_OR_DEPARTMENT_OTHER)
Admission: RE | Admit: 2017-11-10 | Discharge: 2017-11-10 | Disposition: A | Discharge status: Home / Self Care | Payer: Medicare PPO | Source: Ambulatory Visit | Attending: Family Medicine | Admitting: Family Medicine

## 2017-11-10 DIAGNOSIS — M7122 Synovial cyst of popliteal space [Baker], left knee: Secondary | ICD-10-CM | POA: Diagnosis not present

## 2017-11-10 DIAGNOSIS — M79662 Pain in left lower leg: Secondary | ICD-10-CM | POA: Diagnosis not present

## 2017-11-10 DIAGNOSIS — R7989 Other specified abnormal findings of blood chemistry: Secondary | ICD-10-CM | POA: Insufficient documentation

## 2017-11-15 ENCOUNTER — Encounter: Payer: Self-pay | Admitting: Sports Medicine

## 2017-11-15 ENCOUNTER — Ambulatory Visit: Payer: Medicare PPO | Admitting: Sports Medicine

## 2017-11-15 DIAGNOSIS — M79661 Pain in right lower leg: Secondary | ICD-10-CM

## 2017-11-15 DIAGNOSIS — M79662 Pain in left lower leg: Secondary | ICD-10-CM

## 2017-11-15 MED ORDER — AMBULATORY NON FORMULARY MEDICATION: 0 refills | Status: DC

## 2017-11-15 MED ORDER — CILOSTAZOL 100 MG PO TABS: 100.0000 mg | ORAL_TABLET | Freq: Two times a day (BID) | ORAL | 3 refills | Status: DC

## 2017-11-15 NOTE — Progress Notes (Signed)
Subjective:    CC: Bilateral calf pain  HPI: Calf pain: Left worse than the right, ultrasound did show a Baker's cyst but his pain is more distal.  This is been present for many months to years, we treated him sometime ago, ABIs did show right-sided arterial insufficiency, we started Pletal, he is not on it now for some reason.  Pain is worse with walking.  There is some back pain as well, lumbar degenerative changes, he is postop with regards to his back.  Thus his calf cramping is likely multifactorial.  I reviewed the past medical history, family history, social history, surgical history, and allergies today and no changes were needed.  Please see the problem list section below in epic for further details.  Past Medical History: Past Medical History:  Diagnosis Date  . BPH (benign prostatic hyperplasia)   . CAD (coronary artery disease)    a. s/p CABG x1 with LIMA-LAD 1994. b. LM & 3v CAD by cath 07/2012  . CHF (congestive heart failure) ([place])    a. EF 35-40% by echo 07/2012.  [name] Complication of anesthesia    affected patients memory. Pt stated "I couldn't remember anything for three months...just bits and pieces"  . Emphysema    a. Moderate emphysema by CT 06/2012.  [name] Heart attack ([place]) 1994  . Hyperlipidemia   . Hypertension   . Hypoparathyroidism ([place])   . NSVT (nonsustained ventricular tachycardia) ([place])    a. Seen on tele 07/2012.  . OSA on CPAP    pt stated he does not wear CPAP because he no longer has sleep apnea  . Pneumonia   . Pseudoaneurysm of left femoral artery ([place])    a. post cath s/p compression 07/2012, associated w/ anemia.  . Sciatica 2012   Qualifier: Diagnosis of  By: Madilyn Fireman MD, Barnetta Chapel     Past Surgical History: Past Surgical History:  Procedure Laterality Date  . CATARACT EXTRACTION, BILATERAL    . COLONOSCOPY    . CORONARY ARTERY BYPASS GRAFT  03-23-92  . CORONARY ARTERY BYPASS GRAFT N/A 08/14/2012   Procedure: REDO CORONARY ARTERY BYPASS GRAFTING  (CABG);  Surgeon: Gaye Pollack, MD;  Location: [facility];  Service: Open Heart Surgery;  Laterality: N/A;  . INTRAOPERATIVE TRANSESOPHAGEAL ECHOCARDIOGRAM N/A 08/14/2012   Procedure: INTRAOPERATIVE TRANSESOPHAGEAL ECHOCARDIOGRAM;  Surgeon: Gaye Pollack, MD;  Location: [facility];  Service: Open Heart Surgery;  Laterality: N/A;  . LUMBAR LAMINECTOMY/DECOMPRESSION MICRODISCECTOMY Left 02/18/2015   Procedure: LUMBAR LAMINECTOMY/DECOMPRESSION MICRODISCECTOMY- LEFT FOUR-FIVE ;  Surgeon: Ashok Pall, MD;  Location: [facility];  Service: Neurosurgery;  Laterality: Left;   Social History: Social History   Socioeconomic History  . Marital status: Married    Spouse name: Not on file  . Number of children: Not on file  . Years of education: Not on file  . Highest education level: Not on file  Occupational History  . Not on file  Social Needs  . Financial resource strain: Not on file  . Food insecurity:    Worry: Not on file    Inability: Not on file  . Transportation needs:    Medical: Not on file    Non-medical: Not on file  Tobacco Use  . Smoking status: Current Some Day Smoker    Packs/day: 1.00    Years: 60.00    Pack years: 60.00    Last attempt to quit: 10/08/2010    Years since quitting: 7.1  . Smokeless tobacco: Never Used  Substance  and Sexual Activity  . Alcohol use: Yes    Comment: Occasional  . Drug use: No  . Sexual activity: Not on file    Comment: works part-time, Conservation officer, [employer] completed H, married, no children, regular exercise.S  Lifestyle  . Physical activity:    Days per week: Not on file    Minutes per session: Not on file  . Stress: Not on file  Relationships  . Social connections:    Talks on phone: Not on file    Gets together: Not on file    Attends religious service: Not on file    Active member of club or organization: Not on file    Attends meetings of clubs or organizations: Not on file    Relationship status: Not on file  Other Topics Concern  .  Not on file  Social History Narrative  . Not on file   Family History: Family History  Problem Relation Age of Onset  . Heart attack Father 76  . Stroke Brother 71  . Hypertension Brother   . Hyperlipidemia Brother    Allergies: Allergies  Allergen Reactions  . Hydrochlorothiazide Other (See Comments)    Affected renal function.  "affected kidneys and calcium level"   Medications: See med rec.  Review of Systems: No fevers, chills, night sweats, weight loss, chest pain, or shortness of breath.   Objective:    General: Well Developed, well nourished, and in no acute distress.  Neuro: Alert and oriented x3, extra-ocular muscles intact, sensation grossly intact.  HEENT: Normocephalic, atraumatic, pupils equal round reactive to light, neck supple, no masses, no lymphadenopathy, thyroid nonpalpable.  Skin: Warm and dry, no rashes. Cardiac: Regular rate and rhythm, no murmurs rubs or gallops, no lower extremity edema.  Respiratory: Clear to auscultation bilaterally. Not using accessory muscles, speaking in full sentences. Legs: Tender to palpation at the lateral aspect of the calf, at the musculotendinous junction of the lateral head of the gastrocnemius.  Moderately palpable dorsalis pedis and posterior tibial pulses.  Heel lift placed in both shoes.  Impression and Recommendations:    Bilateral calf pain New heel lift given.   I do not think the pain is coming from his Baker's cyst.   He is hurting more at the musculotendinous junction of the lateral head of the gastrocnemius rather than between the crux of the gastrocnemius and semimembranosus were a Baker's cyst sits. Rehab exercises. Compression hose. He does have pain in both calfs. If persistent discomfort we will look further at his lumbar spine, return in 1 month. He also had evidence of moderate vascular insufficiency on the right from an ABI a couple of years ago, they discontinued Pletal sometime ago,  restarting. ___________________________________________ Gwen Her. Dianah Field, M.D., ABFM., CAQSM. Primary Care and [place] Instructor of [place] [employer] [facility] of Medicine

## 2017-11-15 NOTE — Assessment & Plan Note (Addendum)
New heel lift given.   I do not think the pain is coming from his Baker's cyst.   He is hurting more at the musculotendinous junction of the lateral head of the gastrocnemius rather than between the crux of the gastrocnemius and semimembranosus were a Baker's cyst sits. Rehab exercises. Compression hose. He does have pain in both calfs. If persistent discomfort we will look further at his lumbar spine, return in 1 month. He also had evidence of moderate vascular insufficiency on the right from an ABI a couple of years ago, they discontinued Pletal sometime ago, restarting.

## 2017-11-22 ENCOUNTER — Ambulatory Visit: Payer: Medicare PPO | Admitting: Family Medicine

## 2017-11-27 ENCOUNTER — Other Ambulatory Visit: Payer: Self-pay | Admitting: Cardiology

## 2017-12-18 ENCOUNTER — Ambulatory Visit (INDEPENDENT_AMBULATORY_CARE_PROVIDER_SITE_OTHER): Payer: Medicare PPO

## 2017-12-18 ENCOUNTER — Encounter: Payer: Self-pay | Admitting: Sports Medicine

## 2017-12-18 ENCOUNTER — Ambulatory Visit (INDEPENDENT_AMBULATORY_CARE_PROVIDER_SITE_OTHER): Payer: Medicare PPO | Admitting: Sports Medicine

## 2017-12-18 DIAGNOSIS — M48061 Spinal stenosis, lumbar region without neurogenic claudication: Secondary | ICD-10-CM | POA: Diagnosis not present

## 2017-12-18 DIAGNOSIS — M79661 Pain in right lower leg: Secondary | ICD-10-CM | POA: Diagnosis not present

## 2017-12-18 DIAGNOSIS — M79662 Pain in left lower leg: Secondary | ICD-10-CM | POA: Diagnosis not present

## 2017-12-18 DIAGNOSIS — M5136 Other intervertebral disc degeneration, lumbar region: Secondary | ICD-10-CM | POA: Diagnosis not present

## 2017-12-18 DIAGNOSIS — M51369 Other intervertebral disc degeneration, lumbar region without mention of lumbar back pain or lower extremity pain: Secondary | ICD-10-CM

## 2017-12-18 DIAGNOSIS — M5126 Other intervertebral disc displacement, lumbar region: Secondary | ICD-10-CM | POA: Diagnosis not present

## 2017-12-18 DIAGNOSIS — M4807 Spinal stenosis, lumbosacral region: Secondary | ICD-10-CM

## 2017-12-18 NOTE — Assessment & Plan Note (Signed)
Heel lifts did not help, no visible Baker's cyst on ultrasound scanning. Compression hose have not been effective. He did have moderate vascular insufficiency on the right from an ABI a couple of years ago but tells me his left leg is the one that is hurting.   He did self discontinue his Pletal sometime ago but we restarted at the last visit. I still think this is coming more from his lumbar spine.

## 2017-12-18 NOTE — Progress Notes (Signed)
Subjective:    CC: Follow-up  HPI: Joseph Berg returns, he said bilateral, left worse than right calf pain.  A possible Baker's cyst was noted on a previous ultrasound, he does have progressive weakness of both legs, history of lumbar spinal stenosis.  I reviewed the past medical history, family history, social history, surgical history, and allergies today and no changes were needed.  Please see the problem list section below in epic for further details.  Past Medical History: Past Medical History:  Diagnosis Date  . BPH (benign prostatic hyperplasia)   . CAD (coronary artery disease)    a. s/p CABG x1 with LIMA-LAD 1994. b. LM & 3v CAD by cath 07/2012  . CHF (congestive heart failure) ([place])    a. EF 35-40% by echo 07/2012.  [name] Complication of anesthesia    affected patients memory. Pt stated "I couldn't remember anything for three months...just bits and pieces"  . Emphysema    a. Moderate emphysema by CT 06/2012.  [name] Heart attack ([place]) 1994  . Hyperlipidemia   . Hypertension   . Hypoparathyroidism ([place])   . NSVT (nonsustained ventricular tachycardia) ([place])    a. Seen on tele 07/2012.  . OSA on CPAP    pt stated he does not wear CPAP because he no longer has sleep apnea  . Pneumonia   . Pseudoaneurysm of left femoral artery ([place])    a. post cath s/p compression 07/2012, associated w/ anemia.  . Sciatica 2012   Qualifier: Diagnosis of  By: Madilyn Fireman MD, Barnetta Chapel     Past Surgical History: Past Surgical History:  Procedure Laterality Date  . CATARACT EXTRACTION, BILATERAL    . COLONOSCOPY    . CORONARY ARTERY BYPASS GRAFT  03-23-92  . CORONARY ARTERY BYPASS GRAFT N/A 08/14/2012   Procedure: REDO CORONARY ARTERY BYPASS GRAFTING (CABG);  Surgeon: Gaye Pollack, MD;  Location: [facility];  Service: Open Heart Surgery;  Laterality: N/A;  . INTRAOPERATIVE TRANSESOPHAGEAL ECHOCARDIOGRAM N/A 08/14/2012   Procedure: INTRAOPERATIVE TRANSESOPHAGEAL ECHOCARDIOGRAM;  Surgeon: Gaye Pollack, MD;   Location: [facility];  Service: Open Heart Surgery;  Laterality: N/A;  . LUMBAR LAMINECTOMY/DECOMPRESSION MICRODISCECTOMY Left 02/18/2015   Procedure: LUMBAR LAMINECTOMY/DECOMPRESSION MICRODISCECTOMY- LEFT FOUR-FIVE ;  Surgeon: Ashok Pall, MD;  Location: [facility];  Service: Neurosurgery;  Laterality: Left;   Social History: Social History   Socioeconomic History  . Marital status: Married    Spouse name: Not on file  . Number of children: Not on file  . Years of education: Not on file  . Highest education level: Not on file  Occupational History  . Not on file  Social Needs  . Financial resource strain: Not on file  . Food insecurity:    Worry: Not on file    Inability: Not on file  . Transportation needs:    Medical: Not on file    Non-medical: Not on file  Tobacco Use  . Smoking status: Current Some Day Smoker    Packs/day: 1.00    Years: 60.00    Pack years: 60.00    Last attempt to quit: 10/08/2010    Years since quitting: 7.2  . Smokeless tobacco: Never Used  Substance and Sexual Activity  . Alcohol use: Yes    Comment: Occasional  . Drug use: No  . Sexual activity: Not on file    Comment: works part-time, Conservation officer, [employer] completed H, married, no children, regular exercise.S  Lifestyle  . Physical activity:    Days per week:  Not on file    Minutes per session: Not on file  . Stress: Not on file  Relationships  . Social connections:    Talks on phone: Not on file    Gets together: Not on file    Attends religious service: Not on file    Active member of club or organization: Not on file    Attends meetings of clubs or organizations: Not on file    Relationship status: Not on file  Other Topics Concern  . Not on file  Social History Narrative  . Not on file   Family History: Family History  Problem Relation Age of Onset  . Heart attack Father 50  . Stroke Brother 59  . Hypertension Brother   . Hyperlipidemia Brother    Allergies: Allergies    Allergen Reactions  . Hydrochlorothiazide Other (See Comments)    Affected renal function.  "affected kidneys and calcium level"   Medications: See med rec.  Review of Systems: No fevers, chills, night sweats, weight loss, chest pain, or shortness of breath.   Objective:    General: Well Developed, well nourished, and in no acute distress.  Neuro: Alert and oriented x3, extra-ocular muscles intact, sensation grossly intact.  HEENT: Normocephalic, atraumatic, pupils equal round reactive to light, neck supple, no masses, no lymphadenopathy, thyroid nonpalpable.  Skin: Warm and dry, no rashes. Cardiac: Regular rate and rhythm, no murmurs rubs or gallops, no lower extremity edema.  Respiratory: Clear to auscultation bilaterally. Not using accessory muscles, speaking in full sentences. Left knee: Normal to inspection with no erythema or effusion or obvious bony abnormalities. Palpation normal with no warmth or joint line tenderness or patellar tenderness or condyle tenderness. Specifically no palpable Baker's cyst ROM normal in flexion and extension and lower leg rotation. Ligaments with solid consistent endpoints including ACL, PCL, LCL, MCL. Negative Mcmurray's and provocative meniscal tests. Non painful patellar compression. Patellar and quadriceps tendons unremarkable. Hamstring and quadriceps strength is normal.  Procedure: Diagnostic Ultrasound of left posterior knee Device: GE Logiq E  Findings: Normal gastrocnemius/semimembranosus crux, no evidence of Baker's cyst. Images permanently stored and available for review in the ultrasound unit.  Impression: Negative for Baker's cyst.  Impression and Recommendations:    Lumbar degenerative disc disease Post L4-L5 laminectomy in 2017 with good decompression at the L4-L5 level. Having persistent left posterior leg pain, knee pain, calf cramping.  He did well with temporary relief with a left L5-S1 transforaminal epidural. He is  starting to have more paresthesias and weakness in his legs, I would like a new MRI. I do suspect we will be proceeding with another left L5-S1 epidural, I would like to try interlaminar this time. I did image the back of his knee looking for a Baker's cyst, none was found.  I will order the injection after his MRI and he can see me back one month after the shot.  Left L4-L5 interlaminar epidural ordered.  Bilateral calf pain Heel lifts did not help, no visible Baker's cyst on ultrasound scanning. Compression hose have not been effective. He did have moderate vascular insufficiency on the right from an ABI a couple of years ago but tells me his left leg is the one that is hurting.   He did self discontinue his Pletal sometime ago but we restarted at the last visit. I still think this is coming more from his lumbar spine. ___________________________________________ Gwen Her. Dianah Field, M.D., ABFM., CAQSM. Primary Care and [place]  Adjunct Professor of [employer] [facility] [employer]

## 2017-12-18 NOTE — Assessment & Plan Note (Addendum)
Post L4-L5 laminectomy in 2017 with good decompression at the L4-L5 level. Having persistent left posterior leg pain, knee pain, calf cramping.  He did well with temporary relief with a left L5-S1 transforaminal epidural. He is starting to have more paresthesias and weakness in his legs, I would like a new MRI. I do suspect we will be proceeding with another left L5-S1 epidural, I would like to try interlaminar this time. I did image the back of his knee looking for a Baker's cyst, none was found.  I will order the injection after his MRI and he can see me back one month after the shot.  Left L4-L5 interlaminar epidural ordered.

## 2017-12-20 ENCOUNTER — Other Ambulatory Visit: Payer: Self-pay | Admitting: Sports Medicine

## 2017-12-20 MED ORDER — DIAZEPAM 5 MG PO TABS: ORAL_TABLET | ORAL | 0 refills | Status: DC

## 2017-12-28 ENCOUNTER — Inpatient Hospital Stay: Admission: RE | Admit: 2017-12-28 | Payer: Medicare PPO | Source: Ambulatory Visit

## 2017-12-28 ENCOUNTER — Other Ambulatory Visit: Payer: Self-pay | Admitting: Family Medicine

## 2018-01-09 ENCOUNTER — Encounter: Payer: Self-pay | Admitting: Family Medicine

## 2018-01-09 ENCOUNTER — Inpatient Hospital Stay
Admission: RE | Admit: 2018-01-09 | Discharge: 2018-01-09 | Disposition: A | Discharge status: Home / Self Care | Payer: Medicare PPO | Source: Ambulatory Visit | Attending: Sports Medicine | Admitting: Sports Medicine

## 2018-01-09 ENCOUNTER — Ambulatory Visit (INDEPENDENT_AMBULATORY_CARE_PROVIDER_SITE_OTHER): Payer: Medicare PPO | Admitting: Family Medicine

## 2018-01-09 VITALS — BP 149/75 | HR 69 | Temp 98.0°F | Ht 68.0 in | Wt 232.0 lb

## 2018-01-09 DIAGNOSIS — J40 Bronchitis, not specified as acute or chronic: Secondary | ICD-10-CM

## 2018-01-09 DIAGNOSIS — J441 Chronic obstructive pulmonary disease with (acute) exacerbation: Secondary | ICD-10-CM

## 2018-01-09 DIAGNOSIS — J329 Chronic sinusitis, unspecified: Secondary | ICD-10-CM | POA: Diagnosis not present

## 2018-01-09 MED ORDER — HYDROCODONE-HOMATROPINE 5-1.5 MG/5ML PO SYRP: 5.0000 mL | ORAL_SOLUTION | Freq: Every evening | ORAL | 0 refills | Status: DC | PRN

## 2018-01-09 MED ORDER — IPRATROPIUM-ALBUTEROL 0.5-2.5 (3) MG/3ML IN SOLN: 3.0000 mL | Freq: Four times a day (QID) | RESPIRATORY_TRACT | Status: DC

## 2018-01-09 MED ORDER — PREDNISONE 20 MG PO TABS: 40.0000 mg | ORAL_TABLET | Freq: Every day | ORAL | 0 refills | Status: DC

## 2018-01-09 MED ORDER — IPRATROPIUM-ALBUTEROL 0.5-2.5 (3) MG/3ML IN SOLN
3.0000 mL | Freq: Once | RESPIRATORY_TRACT | Status: AC
  Administered 2018-01-09: 3 mL via RESPIRATORY_TRACT

## 2018-01-09 MED ORDER — FLUTICASONE-UMECLIDIN-VILANT 100-62.5-25 MCG/INH IN AEPB: 1.0000 | INHALATION_SPRAY | Freq: Every day | RESPIRATORY_TRACT | 1 refills | Status: DC

## 2018-01-09 MED ORDER — AZITHROMYCIN 250 MG PO TABS: ORAL_TABLET | ORAL | 0 refills | Status: DC

## 2018-01-09 NOTE — Assessment & Plan Note (Signed)
Acute exacerbation.  Continue current regimen.  Albuterol every 4-6 hours as needed during the day.  We will treat with azithromycin and prednisone.

## 2018-01-09 NOTE — Patient Instructions (Signed)
Chronic Obstructive Pulmonary Disease Exacerbation  Chronic obstructive pulmonary disease (COPD) is a common lung problem. In COPD, the flow of air from the lungs is limited. COPD exacerbations are times that breathing gets worse and you need extra treatment. Without treatment they can be life threatening. If they happen often, your lungs can become more damaged. If your COPD gets worse, your doctor may treat you with:  ? Medicines.  ? Oxygen.  ? Different ways to clear your airway, such as using a mask.    Follow these instructions at home:  ? Do not smoke.  ? Avoid tobacco smoke and other things that bother your lungs.  ? If given, take your antibiotic medicine as told. Finish the medicine even if you start to feel better.  ? Only take medicines as told by your doctor.  ? Drink enough fluids to keep your pee (urine) clear or pale yellow (unless your doctor has told you not to).  ? Use a cool mist machine (vaporizer).  ? If you use oxygen or a machine that turns liquid medicine into a mist (nebulizer), continue to use them as told.  ? Keep up with shots (vaccinations) as told by your doctor.  ? Exercise regularly.  ? Eat healthy foods.  ? Keep all doctor visits as told.  Get help right away if:  ? You are very short of breath and it gets worse.  ? You have trouble talking.  ? You have bad chest pain.  ? You have blood in your spit (sputum).  ? You have a fever.  ? You keep throwing up (vomiting).  ? You feel weak, or you pass out (faint).  ? You feel confused.  ? You keep getting worse.  This information is not intended to replace advice given to you by your health care provider. Make sure you discuss any questions you have with your health care provider.  Document Released: 01/13/2011 Document Revised: 07/02/2015 Document Reviewed: 09/28/2012  Elsevier Interactive Patient Education ? 2017 Elsevier Inc.

## 2018-01-09 NOTE — Discharge Instructions (Signed)

## 2018-01-09 NOTE — Addendum Note (Signed)
Addended by: Teddy Spike on: 01/09/2018 03:22 PM   Modules accepted: Orders

## 2018-01-09 NOTE — Progress Notes (Signed)
Acute Office Visit  Subjective:    Patient ID: Joseph Berg, male    DOB: [birth year], 84 y.o.   MRN: [id number]  Chief Complaint  Patient presents with  . Sinusitis    HPI Patient is in today for Cough and fever x 2 days with a hx of emphysema.  Says has been using his nebulizer but not today.  He is getting some thick yellow mucus.  He also has significant nasal congestion and sore throat.  No ear pain.  No upset stomach.  He has felt short of breath.  He did try some Alka-Seltzer cold yesterday but says it really was not helpful.  Past Medical History:  Diagnosis Date  . BPH (benign prostatic hyperplasia)   . CAD (coronary artery disease)    a. s/p CABG x1 with LIMA-LAD 1994. b. LM & 3v CAD by cath 07/2012  . CHF (congestive heart failure) ([place])    a. EF 35-40% by echo 07/2012.  [name] Complication of anesthesia    affected patients memory. Pt stated "I couldn't remember anything for three months...just bits and pieces"  . Emphysema    a. Moderate emphysema by CT 06/2012.  [name] Heart attack ([place]) 1994  . Hyperlipidemia   . Hypertension   . Hypoparathyroidism ([place])   . NSVT (nonsustained ventricular tachycardia) ([place])    a. Seen on tele 07/2012.  . OSA on CPAP    pt stated he does not wear CPAP because he no longer has sleep apnea  . Pneumonia   . Pseudoaneurysm of left femoral artery ([place])    a. post cath s/p compression 07/2012, associated w/ anemia.  . Sciatica 2012   Qualifier: Diagnosis of  By: Madilyn Fireman MD, Barnetta Chapel      Past Surgical History:  Procedure Laterality Date  . CATARACT EXTRACTION, BILATERAL    . COLONOSCOPY    . CORONARY ARTERY BYPASS GRAFT  03-23-92  . CORONARY ARTERY BYPASS GRAFT N/A 08/14/2012   Procedure: REDO CORONARY ARTERY BYPASS GRAFTING (CABG);  Surgeon: Gaye Pollack, MD;  Location: [facility];  Service: Open Heart Surgery;  Laterality: N/A;  . INTRAOPERATIVE TRANSESOPHAGEAL ECHOCARDIOGRAM N/A 08/14/2012   Procedure: INTRAOPERATIVE TRANSESOPHAGEAL  ECHOCARDIOGRAM;  Surgeon: Gaye Pollack, MD;  Location: [facility];  Service: Open Heart Surgery;  Laterality: N/A;  . LUMBAR LAMINECTOMY/DECOMPRESSION MICRODISCECTOMY Left 02/18/2015   Procedure: LUMBAR LAMINECTOMY/DECOMPRESSION MICRODISCECTOMY- LEFT FOUR-FIVE ;  Surgeon: Ashok Pall, MD;  Location: [facility];  Service: Neurosurgery;  Laterality: Left;    Family History  Problem Relation Age of Onset  . Heart attack Father 53  . Stroke Brother 68  . Hypertension Brother   . Hyperlipidemia Brother     Social History   Socioeconomic History  . Marital status: Married    Spouse name: Not on file  . Number of children: Not on file  . Years of education: Not on file  . Highest education level: Not on file  Occupational History  . Not on file  Social Needs  . Financial resource strain: Not on file  . Food insecurity:    Worry: Not on file    Inability: Not on file  . Transportation needs:    Medical: Not on file    Non-medical: Not on file  Tobacco Use  . Smoking status: Current Some Day Smoker    Packs/day: 1.00    Years: 60.00    Pack years: 60.00    Last attempt to quit: 10/08/2010    Years  since quitting: 7.2  . Smokeless tobacco: Never Used  Substance and Sexual Activity  . Alcohol use: Yes    Comment: Occasional  . Drug use: No  . Sexual activity: Not on file    Comment: works part-time, Conservation officer, [employer] completed H, married, no children, regular exercise.S  Lifestyle  . Physical activity:    Days per week: Not on file    Minutes per session: Not on file  . Stress: Not on file  Relationships  . Social connections:    Talks on phone: Not on file    Gets together: Not on file    Attends religious service: Not on file    Active member of club or organization: Not on file    Attends meetings of clubs or organizations: Not on file    Relationship status: Not on file  . Intimate partner violence:    Fear of current or ex partner: Not on file    Emotionally  abused: Not on file    Physically abused: Not on file    Forced sexual activity: Not on file  Other Topics Concern  . Not on file  Social History Narrative  . Not on file    Outpatient Medications Prior to Visit  Medication Sig Dispense Refill  . acetaminophen (TYLENOL) 500 MG tablet Take 1-2 tablets (500-1,000 mg total) by mouth every 6 (six) hours as needed for pain. 30 tablet 0  . AMBULATORY NON FORMULARY MEDICATION Nebulizer - use as needed - with supplies.  Diagnosis of COPD J44.1 1 each 0  . AMBULATORY NON FORMULARY MEDICATION Knee-high, medium compression, graduated compression stockings. Apply to lower extremities. [email or web address], Zippered Compression Stockings, medium circ, long length 1 each 0  . Ascorbic Acid (VITAMIN C) 1000 MG tablet Take 1,000 mg by mouth daily.    [name] aspirin 81 MG tablet Take 81 mg by mouth daily.    . carvedilol (COREG) 12.5 MG tablet Take 1 tablet (12.5 mg total) by mouth 2 (two) times daily with a meal. 180 tablet 3  . celecoxib (CELEBREX) 200 MG capsule TAKE 1 TO 2 CAPSULES BY MOUTH EVERY DAY AS NEEDED FOR PAIN 180 capsule 0  . Cholecalciferol (VITAMIN D PO) Take 3,000 mg by mouth daily.    . cilostazol (PLETAL) 100 MG tablet Take 1 tablet (100 mg total) by mouth 2 (two) times daily. 60 tablet 3  . Cyanocobalamin (VITAMIN B-12 PO) Take 1 tablet by mouth daily.    . finasteride (PROSCAR) 5 MG tablet TAKE 1 TABLET BY MOUTH EVERY DAY 90 tablet 1  . furosemide (LASIX) 40 MG tablet Take 1 tablet (40 mg total) by mouth daily. 90 tablet 3  . ipratropium-albuterol (DUONEB) 0.5-2.5 (3) MG/3ML SOLN INHALE 1 VIAL (3ML) VIA NEBULIZER EVERY 2 HOURS AS NEEDED FOR WHEEZE OR SHORTNESS OF BREATH 90 mL 6  . losartan (COZAAR) 25 MG tablet Take 50 mg by mouth daily.  3  . Multiple Vitamin (MULTIVITAMIN) capsule Take 1 capsule by mouth daily.    . Nebulizer MISC Generic nebulizer plus supplies 1 each 0  . Probiotic Product (TRUBIOTICS PO) Take 1 tablet by mouth daily.     . rosuvastatin (CRESTOR) 40 MG tablet Take 1 tablet (40 mg total) by mouth daily. 30 tablet 5  . VENTOLIN HFA 108 (90 Base) MCG/ACT inhaler TAKE 2 PUFFS BY MOUTH EVERY 6 HOURS AS NEEDED FOR WHEEZE OR SHORTNESS OF BREATH 18 Inhaler 2  . Fluticasone-Umeclidin-Vilant (TRELEGY ELLIPTA) 100-62.5-25 MCG/INH AEPB Inhale 1 Inhaler  into the lungs daily. 3 each 1  . diazepam (VALIUM) 5 MG tablet 1 tab 2 hours before procedure or imaging, take another tab 30 minutes to 1 hour before if not feeling relaxed (Patient not taking: Reported on 01/09/2018) 2 tablet 0  . losartan (COZAAR) 50 MG tablet Take 1 tablet (50 mg total) by mouth daily. 90 tablet 3   No facility-administered medications prior to visit.     Allergies  Allergen Reactions  . Hydrochlorothiazide Other (See Comments)    Affected renal function.  "affected kidneys and calcium level"    ROS     Objective:    Physical Exam  Constitutional: He is oriented to person, place, and time. He appears well-developed and well-nourished.  HENT:  Head: Normocephalic and atraumatic.  Right Ear: External ear normal.  Left Ear: External ear normal.  Nose: Nose normal.  Mouth/Throat: Oropharynx is clear and moist.  TMs and canals are clear.   Eyes: Pupils are equal, round, and reactive to light. Conjunctivae and EOM are normal.  Neck: Neck supple. No thyromegaly present.  Cardiovascular: Normal rate and normal heart sounds.  Pulmonary/Chest: Effort normal and breath sounds normal.  Lymphadenopathy:    He has no cervical adenopathy.  Neurological: He is alert and oriented to person, place, and time.  Skin: Skin is warm and dry.  Psychiatric: He has a normal mood and affect.    BP (!) 149/75   Pulse 69   Temp 98 F (36.7 C)   Ht 5\' 8"  (1.727 m)   Wt 232 lb (105.2 kg)   SpO2 92%   BMI 35.28 kg/m  Wt Readings from Last 3 Encounters:  01/09/18 232 lb (105.2 kg)  11/03/17 226 lb (102.5 kg)  08/03/17 226 lb (102.5 kg)    Health  Maintenance Due  Topic Date Due  . INFLUENZA VACCINE  09/07/2017    There are no preventive care reminders to display for this patient.   Lab Results  Component Value Date   TSH 1.65 11/03/2017   Lab Results  Component Value Date   WBC 10.1 07/04/2017   HGB 15.0 07/04/2017   HCT 43.3 07/04/2017   MCV 88.5 07/04/2017   PLT 179 07/04/2017   Lab Results  Component Value Date   NA 140 11/03/2017   K 4.3 11/03/2017   CO2 30 11/03/2017   GLUCOSE 103 (H) 11/03/2017   BUN 19 11/03/2017   CREATININE 0.95 11/03/2017   BILITOT 0.8 11/03/2017   ALKPHOS 43 06/29/2016   AST 18 11/03/2017   ALT 12 11/03/2017   PROT 6.5 11/03/2017   ALBUMIN 4.0 06/29/2016   CALCIUM 10.6 (H) 11/03/2017   ANIONGAP 9 03/20/2016   Lab Results  Component Value Date   CHOL 104 07/11/2017   Lab Results  Component Value Date   HDL 35 (L) 07/11/2017   Lab Results  Component Value Date   LDLCALC 46 07/11/2017   Lab Results  Component Value Date   TRIG 145 07/11/2017   Lab Results  Component Value Date   CHOLHDL 3.0 07/11/2017   Lab Results  Component Value Date   HGBA1C 5.7 (A) 11/03/2017       Assessment & Plan:   Problem List Items Addressed This Visit      Respiratory   COPD exacerbation ([place]) - Primary    Acute exacerbation.  Continue current regimen.  Albuterol every 4-6 hours as needed during the day.  We will treat with azithromycin and prednisone.  Relevant Medications   azithromycin (ZITHROMAX) 250 MG tablet   predniSONE (DELTASONE) 20 MG tablet   Fluticasone-Umeclidin-Vilant (TRELEGY ELLIPTA) 100-62.5-25 MCG/INH AEPB   HYDROcodone-homatropine (HYCODAN) 5-1.5 MG/5ML syrup    Other Visit Diagnoses    Sinobronchitis       Relevant Medications   azithromycin (ZITHROMAX) 250 MG tablet   predniSONE (DELTASONE) 20 MG tablet   HYDROcodone-homatropine (HYCODAN) 5-1.5 MG/5ML syrup     Given nebulizer here in the office because he was short of breath with ambulation.  Is  getting worse.  I want him to be aggressive with his albuterol.  If not better in 1 week then please give [place] call back.   Meds ordered this encounter  Medications  . azithromycin (ZITHROMAX) 250 MG tablet    Sig: 2 Ttabs PO on Day 1, then one a day x 4 days.    Dispense:  6 tablet    Refill:  0  . predniSONE (DELTASONE) 20 MG tablet    Sig: Take 2 tablets (40 mg total) by mouth daily with breakfast.    Dispense:  10 tablet    Refill:  0  . Fluticasone-Umeclidin-Vilant (TRELEGY ELLIPTA) 100-62.5-25 MCG/INH AEPB    Sig: Inhale 1 Inhaler into the lungs daily.    Dispense:  3 each    Refill:  1  . DISCONTD: HYDROcodone-homatropine (HYCODAN) 5-1.5 MG/5ML syrup    Sig: Take 5 mLs by mouth at bedtime as needed for cough.    Dispense:  120 mL    Refill:  0  . HYDROcodone-homatropine (HYCODAN) 5-1.5 MG/5ML syrup    Sig: Take 5 mLs by mouth at bedtime as needed for cough.    Dispense:  120 mL    Refill:  0     Beatrice Lecher, MD

## 2018-01-09 NOTE — Addendum Note (Signed)
Addended by: Teddy Spike on: 01/09/2018 03:24 PM   Modules accepted: Orders

## 2018-01-11 ENCOUNTER — Encounter (HOSPITAL_COMMUNITY): Payer: Self-pay | Admitting: Emergency Medicine

## 2018-01-11 ENCOUNTER — Emergency Department (HOSPITAL_COMMUNITY): Payer: Medicare PPO

## 2018-01-11 ENCOUNTER — Inpatient Hospital Stay (HOSPITAL_COMMUNITY)
Admission: EM | Admit: 2018-01-11 | Discharge: 2018-01-18 | DRG: 193 | Disposition: A | Discharge status: Home Health | Payer: Medicare PPO | Attending: Internal Medicine | Admitting: Internal Medicine

## 2018-01-11 ENCOUNTER — Emergency Department (INDEPENDENT_AMBULATORY_CARE_PROVIDER_SITE_OTHER)
Admission: EM | Admit: 2018-01-11 | Discharge: 2018-01-11 | Payer: Medicare PPO | Source: Home / Self Care | Attending: Family Medicine | Admitting: Family Medicine

## 2018-01-11 ENCOUNTER — Other Ambulatory Visit: Payer: Self-pay

## 2018-01-11 ENCOUNTER — Encounter: Payer: Self-pay | Admitting: *Deleted

## 2018-01-11 DIAGNOSIS — I5023 Acute on chronic systolic (congestive) heart failure: Secondary | ICD-10-CM | POA: Diagnosis not present

## 2018-01-11 DIAGNOSIS — R0902 Hypoxemia: Secondary | ICD-10-CM | POA: Diagnosis not present

## 2018-01-11 DIAGNOSIS — R0602 Shortness of breath: Secondary | ICD-10-CM

## 2018-01-11 DIAGNOSIS — I255 Ischemic cardiomyopathy: Secondary | ICD-10-CM | POA: Diagnosis present

## 2018-01-11 DIAGNOSIS — I5022 Chronic systolic (congestive) heart failure: Secondary | ICD-10-CM | POA: Diagnosis not present

## 2018-01-11 DIAGNOSIS — I509 Heart failure, unspecified: Secondary | ICD-10-CM

## 2018-01-11 DIAGNOSIS — N400 Enlarged prostate without lower urinary tract symptoms: Secondary | ICD-10-CM | POA: Diagnosis present

## 2018-01-11 DIAGNOSIS — E669 Obesity, unspecified: Secondary | ICD-10-CM | POA: Diagnosis present

## 2018-01-11 DIAGNOSIS — I110 Hypertensive heart disease with heart failure: Secondary | ICD-10-CM | POA: Diagnosis not present

## 2018-01-11 DIAGNOSIS — I2510 Atherosclerotic heart disease of native coronary artery without angina pectoris: Secondary | ICD-10-CM | POA: Diagnosis present

## 2018-01-11 DIAGNOSIS — Z79899 Other long term (current) drug therapy: Secondary | ICD-10-CM

## 2018-01-11 DIAGNOSIS — J441 Chronic obstructive pulmonary disease with (acute) exacerbation: Secondary | ICD-10-CM | POA: Diagnosis present

## 2018-01-11 DIAGNOSIS — R079 Chest pain, unspecified: Secondary | ICD-10-CM | POA: Diagnosis not present

## 2018-01-11 DIAGNOSIS — E209 Hypoparathyroidism, unspecified: Secondary | ICD-10-CM | POA: Diagnosis present

## 2018-01-11 DIAGNOSIS — I361 Nonrheumatic tricuspid (valve) insufficiency: Secondary | ICD-10-CM | POA: Diagnosis not present

## 2018-01-11 DIAGNOSIS — Z7982 Long term (current) use of aspirin: Secondary | ICD-10-CM

## 2018-01-11 DIAGNOSIS — Z6834 Body mass index (BMI) 34.0-34.9, adult: Secondary | ICD-10-CM

## 2018-01-11 DIAGNOSIS — I4510 Unspecified right bundle-branch block: Secondary | ICD-10-CM | POA: Diagnosis not present

## 2018-01-11 DIAGNOSIS — E785 Hyperlipidemia, unspecified: Secondary | ICD-10-CM | POA: Diagnosis present

## 2018-01-11 DIAGNOSIS — J9601 Acute respiratory failure with hypoxia: Secondary | ICD-10-CM | POA: Diagnosis present

## 2018-01-11 DIAGNOSIS — J189 Pneumonia, unspecified organism: Principal | ICD-10-CM | POA: Diagnosis present

## 2018-01-11 DIAGNOSIS — I248 Other forms of acute ischemic heart disease: Secondary | ICD-10-CM | POA: Diagnosis not present

## 2018-01-11 DIAGNOSIS — I6529 Occlusion and stenosis of unspecified carotid artery: Secondary | ICD-10-CM | POA: Diagnosis present

## 2018-01-11 DIAGNOSIS — I959 Hypotension, unspecified: Secondary | ICD-10-CM | POA: Diagnosis not present

## 2018-01-11 DIAGNOSIS — Z9119 Patient's noncompliance with other medical treatment and regimen: Secondary | ICD-10-CM

## 2018-01-11 DIAGNOSIS — E875 Hyperkalemia: Secondary | ICD-10-CM | POA: Diagnosis not present

## 2018-01-11 DIAGNOSIS — Z951 Presence of aortocoronary bypass graft: Secondary | ICD-10-CM | POA: Diagnosis not present

## 2018-01-11 DIAGNOSIS — Z7401 Bed confinement status: Secondary | ICD-10-CM

## 2018-01-11 DIAGNOSIS — N179 Acute kidney failure, unspecified: Secondary | ICD-10-CM | POA: Diagnosis not present

## 2018-01-11 DIAGNOSIS — G4733 Obstructive sleep apnea (adult) (pediatric): Secondary | ICD-10-CM | POA: Diagnosis present

## 2018-01-11 DIAGNOSIS — Z7952 Long term (current) use of systemic steroids: Secondary | ICD-10-CM | POA: Diagnosis not present

## 2018-01-11 DIAGNOSIS — I5043 Acute on chronic combined systolic (congestive) and diastolic (congestive) heart failure: Secondary | ICD-10-CM | POA: Diagnosis not present

## 2018-01-11 DIAGNOSIS — I714 Abdominal aortic aneurysm, without rupture: Secondary | ICD-10-CM | POA: Diagnosis present

## 2018-01-11 DIAGNOSIS — Z8249 Family history of ischemic heart disease and other diseases of the circulatory system: Secondary | ICD-10-CM | POA: Diagnosis not present

## 2018-01-11 DIAGNOSIS — R0689 Other abnormalities of breathing: Secondary | ICD-10-CM | POA: Diagnosis not present

## 2018-01-11 DIAGNOSIS — Z888 Allergy status to other drugs, medicaments and biological substances status: Secondary | ICD-10-CM

## 2018-01-11 DIAGNOSIS — F17210 Nicotine dependence, cigarettes, uncomplicated: Secondary | ICD-10-CM | POA: Diagnosis present

## 2018-01-11 LAB — COMPREHENSIVE METABOLIC PANEL
ALT: 14 U/L (ref 0–44)
AST: 22 U/L (ref 15–41)
Albumin: 3.1 g/dL — ABNORMAL LOW (ref 3.5–5.0)
Alkaline Phosphatase: 39 U/L (ref 38–126)
Anion gap: 11 (ref 5–15)
BUN: 23 mg/dL (ref 8–23)
CHLORIDE: 102 mmol/L (ref 98–111)
CO2: 28 mmol/L (ref 22–32)
Calcium: 9.1 mg/dL (ref 8.9–10.3)
Creatinine, Ser: 1.57 mg/dL — ABNORMAL HIGH (ref 0.61–1.24)
GFR calc Af Amer: 47 mL/min — ABNORMAL LOW (ref >60)
GFR calc non Af Amer: 40 mL/min — ABNORMAL LOW (ref >60)
Glucose, Bld: 147 mg/dL — ABNORMAL HIGH (ref 70–99)
POTASSIUM: 4.3 mmol/L (ref 3.5–5.1)
Sodium: 141 mmol/L (ref 135–145)
Total Bilirubin: 1 mg/dL (ref 0.3–1.2)
Total Protein: 5.6 g/dL — ABNORMAL LOW (ref 6.5–8.1)

## 2018-01-11 LAB — CBC WITH DIFFERENTIAL/PLATELET
Abs Immature Granulocytes: 0 10*3/uL (ref 0.00–0.07)
Band Neutrophils: 1 %
Basophils Absolute: 0 10*3/uL (ref 0.0–0.1)
Basophils Relative: 0 %
EOS PCT: 0 %
Eosinophils Absolute: 0 10*3/uL (ref 0.0–0.5)
HCT: 43.4 % (ref 39.0–52.0)
Hemoglobin: 13.8 g/dL (ref 13.0–17.0)
Lymphocytes Relative: 11 %
Lymphs Abs: 1.6 10*3/uL (ref 0.7–4.0)
MCH: 29.6 pg (ref 26.0–34.0)
MCHC: 31.8 g/dL (ref 30.0–36.0)
MCV: 93.1 fL (ref 80.0–100.0)
Monocytes Absolute: 1 10*3/uL (ref 0.1–1.0)
Monocytes Relative: 7 %
Neutro Abs: 11.9 10*3/uL — ABNORMAL HIGH (ref 1.7–7.7)
Neutrophils Relative %: 81 %
Platelets: 160 10*3/uL (ref 150–400)
RBC: 4.66 MIL/uL (ref 4.22–5.81)
RDW: 13.3 % (ref 11.5–15.5)
WBC: 14.5 10*3/uL — AB (ref 4.0–10.5)
nRBC: 0 % (ref 0.0–0.2)
nRBC: 0 /100 WBC

## 2018-01-11 LAB — BLOOD GAS, ARTERIAL
ACID-BASE EXCESS: 4.3 mmol/L — AB (ref 0.0–2.0)
Bicarbonate: 28.9 mmol/L — ABNORMAL HIGH (ref 20.0–28.0)
Delivery systems: POSITIVE
Drawn by: 54738
Expiratory PAP: 8
FIO2: 60
Inspiratory PAP: 14
O2 Saturation: 94.8 %
PCO2 ART: 48 mmHg (ref 32.0–48.0)
PH ART: 7.397 (ref 7.350–7.450)
Patient temperature: 98.6
RATE: 22 resp/min
pO2, Arterial: 72.3 mmHg — ABNORMAL LOW (ref 83.0–108.0)

## 2018-01-11 LAB — BRAIN NATRIURETIC PEPTIDE: B Natriuretic Peptide: 296.6 pg/mL — ABNORMAL HIGH (ref 0.0–100.0)

## 2018-01-11 LAB — TROPONIN I
Troponin I: 0.19 ng/mL (ref <0.03)
Troponin I: 0.23 ng/mL (ref <0.03)
Troponin I: 0.23 ng/mL (ref <0.03)

## 2018-01-11 LAB — I-STAT CG4 LACTIC ACID, ED: Lactic Acid, Venous: 1.84 mmol/L (ref 0.5–1.9)

## 2018-01-11 LAB — MRSA PCR SCREENING: MRSA by PCR: NEGATIVE

## 2018-01-11 MED ORDER — FUROSEMIDE 40 MG PO TABS: 40.0000 mg | ORAL_TABLET | Freq: Every day | ORAL | Status: DC

## 2018-01-11 MED ORDER — ACETAMINOPHEN 500 MG PO TABS
500.0000 mg | ORAL_TABLET | Freq: Four times a day (QID) | ORAL | Status: DC | PRN
  Administered 2018-01-12: 650 mg via ORAL
  Administered 2018-01-14: 1000 mg via ORAL
  Administered 2018-01-15: 500 mg via ORAL
  Filled 2018-01-11: qty 1
  Filled 2018-01-11 (×2): qty 2

## 2018-01-11 MED ORDER — FINASTERIDE 5 MG PO TABS
5.0000 mg | ORAL_TABLET | Freq: Every day | ORAL | Status: DC
  Administered 2018-01-11 – 2018-01-18 (×8): 5 mg via ORAL
  Filled 2018-01-11 (×8): qty 1

## 2018-01-11 MED ORDER — ENOXAPARIN SODIUM 40 MG/0.4ML ~~LOC~~ SOLN
40.0000 mg | SUBCUTANEOUS | Status: DC
  Administered 2018-01-11 – 2018-01-15 (×5): 40 mg via SUBCUTANEOUS
  Filled 2018-01-11 (×5): qty 0.4

## 2018-01-11 MED ORDER — FUROSEMIDE 10 MG/ML IJ SOLN
40.0000 mg | Freq: Once | INTRAMUSCULAR | Status: AC
  Administered 2018-01-11: 40 mg via INTRAVENOUS
  Filled 2018-01-11: qty 4

## 2018-01-11 MED ORDER — IPRATROPIUM BROMIDE 0.02 % IN SOLN
0.5000 mg | Freq: Four times a day (QID) | RESPIRATORY_TRACT | Status: DC
  Administered 2018-01-11 – 2018-01-12 (×3): 0.5 mg via RESPIRATORY_TRACT
  Filled 2018-01-11 (×3): qty 2.5

## 2018-01-11 MED ORDER — ONDANSETRON HCL 4 MG/2ML IJ SOLN: 4.0000 mg | Freq: Four times a day (QID) | INTRAMUSCULAR | Status: DC | PRN

## 2018-01-11 MED ORDER — ROSUVASTATIN CALCIUM 20 MG PO TABS
40.0000 mg | ORAL_TABLET | Freq: Every day | ORAL | Status: DC
  Administered 2018-01-11 – 2018-01-17 (×7): 40 mg via ORAL
  Filled 2018-01-11 (×7): qty 2

## 2018-01-11 MED ORDER — ROSUVASTATIN CALCIUM 40 MG PO TABS
40.0000 mg | ORAL_TABLET | Freq: Every day | ORAL | Status: DC
  Filled 2018-01-11: qty 1

## 2018-01-11 MED ORDER — VANCOMYCIN HCL 10 G IV SOLR
1250.0000 mg | INTRAVENOUS | Status: DC
  Administered 2018-01-11: 1250 mg via INTRAVENOUS
  Filled 2018-01-11: qty 1250

## 2018-01-11 MED ORDER — LOSARTAN POTASSIUM 50 MG PO TABS
50.0000 mg | ORAL_TABLET | Freq: Every day | ORAL | Status: DC
  Administered 2018-01-12 – 2018-01-13 (×2): 50 mg via ORAL
  Filled 2018-01-11 (×2): qty 1

## 2018-01-11 MED ORDER — SODIUM CHLORIDE 0.9 % IV SOLN
2.0000 g | INTRAVENOUS | Status: DC
  Administered 2018-01-11 – 2018-01-12 (×2): 2 g via INTRAVENOUS
  Filled 2018-01-11 (×3): qty 2

## 2018-01-11 MED ORDER — HYDROCODONE-HOMATROPINE 5-1.5 MG/5ML PO SYRP
5.0000 mL | ORAL_SOLUTION | Freq: Every evening | ORAL | Status: DC | PRN
  Administered 2018-01-12 – 2018-01-14 (×4): 5 mL via ORAL
  Filled 2018-01-11 (×4): qty 5

## 2018-01-11 MED ORDER — HYDROCODONE-ACETAMINOPHEN 5-325 MG PO TABS
1.0000 | ORAL_TABLET | ORAL | Status: DC | PRN
  Administered 2018-01-13: 1 via ORAL
  Administered 2018-01-13: 2 via ORAL
  Filled 2018-01-11 (×3): qty 2

## 2018-01-11 MED ORDER — ALBUTEROL SULFATE (2.5 MG/3ML) 0.083% IN NEBU: 2.5000 mg | INHALATION_SOLUTION | RESPIRATORY_TRACT | Status: DC | PRN

## 2018-01-11 MED ORDER — CARVEDILOL 12.5 MG PO TABS
12.5000 mg | ORAL_TABLET | Freq: Two times a day (BID) | ORAL | Status: DC
  Administered 2018-01-11 – 2018-01-18 (×14): 12.5 mg via ORAL
  Filled 2018-01-11 (×14): qty 1

## 2018-01-11 MED ORDER — SODIUM CHLORIDE 0.9% FLUSH: 3.0000 mL | INTRAVENOUS | Status: DC | PRN

## 2018-01-11 MED ORDER — SODIUM CHLORIDE 0.9 % IV SOLN: 250.0000 mL | INTRAVENOUS | Status: DC | PRN

## 2018-01-11 MED ORDER — ONDANSETRON HCL 4 MG PO TABS: 4.0000 mg | ORAL_TABLET | Freq: Four times a day (QID) | ORAL | Status: DC | PRN

## 2018-01-11 MED ORDER — CILOSTAZOL 100 MG PO TABS
100.0000 mg | ORAL_TABLET | Freq: Two times a day (BID) | ORAL | Status: DC
  Administered 2018-01-11 – 2018-01-18 (×14): 100 mg via ORAL
  Filled 2018-01-11 (×14): qty 1

## 2018-01-11 MED ORDER — METHYLPREDNISOLONE SODIUM SUCC 125 MG IJ SOLR
60.0000 mg | Freq: Three times a day (TID) | INTRAMUSCULAR | Status: DC
  Administered 2018-01-11 – 2018-01-13 (×6): 60 mg via INTRAVENOUS
  Filled 2018-01-11 (×6): qty 2

## 2018-01-11 MED ORDER — ALBUTEROL SULFATE (2.5 MG/3ML) 0.083% IN NEBU
2.5000 mg | INHALATION_SOLUTION | Freq: Four times a day (QID) | RESPIRATORY_TRACT | Status: DC
  Administered 2018-01-11 – 2018-01-12 (×3): 2.5 mg via RESPIRATORY_TRACT
  Filled 2018-01-11 (×3): qty 3

## 2018-01-11 MED ORDER — SODIUM CHLORIDE 0.9 % IV SOLN
1.0000 g | Freq: Once | INTRAVENOUS | Status: AC
  Administered 2018-01-11: 1 g via INTRAVENOUS
  Filled 2018-01-11: qty 10

## 2018-01-11 MED ORDER — ASPIRIN 81 MG PO CHEW
81.0000 mg | CHEWABLE_TABLET | Freq: Every day | ORAL | Status: DC
  Administered 2018-01-11 – 2018-01-18 (×8): 81 mg via ORAL
  Filled 2018-01-11 (×8): qty 1

## 2018-01-11 MED ORDER — SODIUM CHLORIDE 0.9% FLUSH
3.0000 mL | Freq: Two times a day (BID) | INTRAVENOUS | Status: DC
  Administered 2018-01-11 – 2018-01-18 (×15): 3 mL via INTRAVENOUS

## 2018-01-11 NOTE — ED Notes (Signed)
Pt tolerating BIPAP well.

## 2018-01-11 NOTE — H&P (Signed)
[facility]                                                                                    Patient Demographics  Joseph Berg, is a 84 y.o. male  CSN: [id number]  MRN: [id number]  DOB - April 30, 1933  Admit Date - 01/11/2018  Outpatient Primary MD for the patient is [name], MD   With History of -  Past Medical History:  Diagnosis Date  . BPH (benign prostatic hyperplasia)   . CAD (coronary artery disease)    a. s/p CABG x1 with LIMA-LAD 1994. b. LM & 3v CAD by cath 07/2012  . CHF (congestive heart failure) ([place])    a. EF 35-40% by echo 07/2012.  [name] Complication of anesthesia    affected patients memory. Pt stated "I couldn't remember anything for three months...just bits and pieces"  . Emphysema    a. Moderate emphysema by CT 06/2012.  [name] Heart attack ([place]) 1994  . Hyperlipidemia   . Hypertension   . Hypoparathyroidism ([place])   . NSVT (nonsustained ventricular tachycardia) ([place])    a. Seen on tele 07/2012.  . OSA on CPAP    pt stated he does not wear CPAP because he no longer has sleep apnea  . Pneumonia   . Pseudoaneurysm of left femoral artery ([place])    a. post cath s/p compression 07/2012, associated w/ anemia.  . Sciatica 2012   Qualifier: Diagnosis of  By: Madilyn Fireman MD, Barnetta Chapel        Past Surgical History:  Procedure Laterality Date  . CATARACT EXTRACTION, BILATERAL    . COLONOSCOPY    . CORONARY ARTERY BYPASS GRAFT  03-23-92  . CORONARY ARTERY BYPASS GRAFT N/A 08/14/2012   Procedure: REDO CORONARY ARTERY BYPASS GRAFTING (CABG);  Surgeon: Gaye Pollack, MD;  Location: [facility];  Service: Open Heart Surgery;  Laterality: N/A;  . INTRAOPERATIVE TRANSESOPHAGEAL ECHOCARDIOGRAM N/A 08/14/2012   Procedure: INTRAOPERATIVE TRANSESOPHAGEAL ECHOCARDIOGRAM;  Surgeon: Gaye Pollack, MD;  Location: [facility];  Service: Open Heart Surgery;  Laterality: N/A;  . LUMBAR LAMINECTOMY/DECOMPRESSION MICRODISCECTOMY Left 02/18/2015   Procedure: LUMBAR  LAMINECTOMY/DECOMPRESSION MICRODISCECTOMY- LEFT FOUR-FIVE ;  Surgeon: Ashok Pall, MD;  Location: [facility];  Service: Neurosurgery;  Laterality: Left;    in for   Chief Complaint  Patient presents with  . Shortness of Breath     HPI  Joseph Berg  is a 84 y.o. male, with past medical history significant for coronary artery disease status post CABG in 4268, systolic congestive heart failure with ejection fraction 35 to 40% by echo and February 2018, history of emphysema resenting today with 5 days history of worsening shortness of breath and upper respiratory symptoms.  Patient saw his primary doctor on Tuesday who started him on p.o. antibiotics and steroids.  Yesterday the patient went to urgent care and was found to be hypoxemic in the 60s.  Work-up in the emergency room showed multifocal pneumonia and the patient responded very well on BiPAP.  His troponin was elevated at 0.19.  Patient reports fever and chills at home but no chest pains palpitations.  He reports shortness of breath  as mentioned above.  Has a productive cough    Review of Systems    In addition to the HPI above,  No Headache, No changes with Vision or hearing, No problems swallowing food or Liquids, No Chest pain,  No Abdominal pain, No Nausea or Vommitting, Bowel movements are regular, No Blood in stool or Urine, No dysuria, No new skin rashes or bruises, No new joints pains-aches,  No new weakness, tingling, numbness in any extremity, No recent weight gain or loss, No polyuria, polydypsia or polyphagia, No significant Mental Stressors.  A full 10 point Review of Systems was done, except as stated above, all other Review of Systems were negative.   Social History Social History   Tobacco Use  . Smoking status: Current Some Day Smoker    Packs/day: 1.00    Years: 60.00    Pack years: 60.00    Last attempt to quit: 10/08/2010    Years since quitting: 7.2  . Smokeless tobacco: Never Used   Substance Use Topics  . Alcohol use: Yes    Comment: Occasional     Family History Family History  Problem Relation Age of Onset  . Heart attack Father 7  . Stroke Brother 12  . Hypertension Brother   . Hyperlipidemia Brother      Prior to Admission medications   Medication Sig Start Date End Date Taking? Authorizing Provider  acetaminophen (TYLENOL) 500 MG tablet Take 1-2 tablets (500-1,000 mg total) by mouth every 6 (six) hours as needed for pain. 08/03/12  Yes Dunn, Dayna N, PA-C  AMBULATORY NON FORMULARY MEDICATION Knee-high, medium compression, graduated compression stockings. Apply to lower extremities. [email or web address], Zippered Compression Stockings, medium circ, long length 11/15/17  Yes Silverio Decamp, MD  Ascorbic Acid (VITAMIN C) 1000 MG tablet Take 1,000 mg by mouth daily.   Yes [provider]  aspirin 81 MG tablet Take 81 mg by mouth daily.   Yes [provider]  azithromycin (ZITHROMAX) 250 MG tablet 2 Ttabs PO on Day 1, then one a day x 4 days. 01/09/18 01/14/18 Yes [name], MD  carvedilol (COREG) 12.5 MG tablet Take 1 tablet (12.5 mg total) by mouth 2 (two) times daily with a meal. 07/05/17  Yes Crenshaw, Denice Bors, MD  celecoxib (CELEBREX) 200 MG capsule TAKE 1 TO 2 CAPSULES BY MOUTH EVERY DAY AS NEEDED FOR PAIN Patient taking differently: Take 200-400 mg by mouth as needed.  07/28/17  Yes Silverio Decamp, MD  Cholecalciferol (VITAMIN D PO) Take 3,000 mg by mouth daily.   Yes [provider]  cilostazol (PLETAL) 100 MG tablet Take 1 tablet (100 mg total) by mouth 2 (two) times daily. 11/15/17  Yes Silverio Decamp, MD  Cyanocobalamin (VITAMIN B-12 PO) Take 1 tablet by mouth daily.   Yes [provider]  finasteride (PROSCAR) 5 MG tablet TAKE 1 TABLET BY MOUTH EVERY DAY Patient taking differently: Take 5 mg by mouth daily.  10/10/17  Yes [name], MD  Fluticasone-Umeclidin-Vilant  (TRELEGY ELLIPTA) 100-62.5-25 MCG/INH AEPB Inhale 1 Inhaler into the lungs daily. 01/09/18  Yes [name], MD  furosemide (LASIX) 40 MG tablet Take 1 tablet (40 mg total) by mouth daily. 07/05/17  Yes Lelon Perla, MD  HYDROcodone-homatropine [facility]) 5-1.5 MG/5ML syrup Take 5 mLs by mouth at bedtime as needed for cough. 01/09/18  Yes [name], MD  ipratropium-albuterol (DUONEB) 0.5-2.5 (3) MG/3ML SOLN Take 3 mLs by nebulization every 2 (two) hours  as needed.   Yes [provider]  losartan (COZAAR) 25 MG tablet Take 50 mg by mouth daily. 12/22/17  Yes [provider]  Multiple Vitamin (MULTIVITAMIN) capsule Take 1 capsule by mouth daily.   Yes [provider]  predniSONE (DELTASONE) 20 MG tablet Take 2 tablets (40 mg total) by mouth daily with breakfast. Patient taking differently: Take 20 mg by mouth 2 (two) times daily with a meal.  01/09/18  Yes [name], MD  Probiotic Product (TRUBIOTICS PO) Take 1 tablet by mouth daily.   Yes [provider]  rosuvastatin (CRESTOR) 40 MG tablet Take 1 tablet (40 mg total) by mouth daily. 11/27/17  Yes Crenshaw, Denice Bors, MD  VENTOLIN HFA 108 (90 Base) MCG/ACT inhaler TAKE 2 PUFFS BY MOUTH EVERY 6 HOURS AS NEEDED FOR WHEEZE OR SHORTNESS OF BREATH Patient taking differently: Inhale 2 puffs into the lungs every 6 (six) hours as needed.  12/28/17  Yes [name], MD  AMBULATORY NON FORMULARY MEDICATION Nebulizer - use as needed - with supplies.  Diagnosis of COPD J44.1 09/19/17   [name], MD  Nebulizer MISC Generic nebulizer plus supplies 03/26/15   Emeterio Reeve, DO    Allergies  Allergen Reactions  . Hydrochlorothiazide Other (See Comments)    Affected renal function.  "affected kidneys and calcium level"    Physical Exam  Vitals  Blood pressure 107/67, pulse 74, resp. rate (!) 21, SpO2 92 %.   1. General well-developed, obese male, very  pleasant  2. Normal affect and insight, Not Suicidal or Homicidal, Awake Alert, Oriented X 3.  3. No F.N deficits, grossly, patient moving all extremities.  4. Ears and Eyes appear Normal, Conjunctivae clear, PERRLA. Moist Oral Mucosa.  5. Supple Neck, No JVD, No cervical lymphadenopathy appriciated, No Carotid Bruits.  6. Symmetrical Chest wall movement, mild scattered rhonchi  7. RRR, No Gallops, Rubs or Murmurs, No Parasternal Heave.  8. Positive Bowel Sounds, Abdomen Soft, Non tender, obese.  9.  No Cyanosis, Normal Skin Turgor, No Skin Rash or Bruise.  10. Good muscle tone,  joints appear normal , +1 lower extremity edema.      Data Review  CBC Recent Labs  Lab 01/11/18 0935  WBC 14.5*  HGB 13.8  HCT 43.4  PLT 160  MCV 93.1  MCH 29.6  MCHC 31.8  RDW 13.3  LYMPHSABS 1.6  MONOABS 1.0  EOSABS 0.0  BASOSABS 0.0   ------------------------------------------------------------------------------------------------------------------  Chemistries  Recent Labs  Lab 01/11/18 0935  NA 141  K 4.3  CL 102  CO2 28  GLUCOSE 147*  BUN 23  CREATININE 1.57*  CALCIUM 9.1  AST 22  ALT 14  ALKPHOS 39  BILITOT 1.0   ------------------------------------------------------------------------------------------------------------------ estimated creatinine clearance is 42.6 mL/min (A) (by C-G formula based on SCr of 1.57 mg/dL (H)). ------------------------------------------------------------------------------------------------------------------ No results for input(s): TSH, T4TOTAL, T3FREE, THYROIDAB in the last 72 hours.  Invalid input(s): FREET3   Coagulation profile No results for input(s): INR, PROTIME in the last 168 hours. ------------------------------------------------------------------------------------------------------------------- No results for input(s): DDIMER in the last 72  hours. -------------------------------------------------------------------------------------------------------------------  Cardiac Enzymes Recent Labs  Lab 01/11/18 0935  TROPONINI 0.19*   ------------------------------------------------------------------------------------------------------------------ Invalid input(s): POCBNP   ---------------------------------------------------------------------------------------------------------------  Urinalysis    Component Value Date/Time   COLORURINE YELLOW 03/17/2016 2203   APPEARANCEUR HAZY (A) 03/17/2016 2203   LABSPEC 1.018 03/17/2016 2203   PHURINE 5.0 03/17/2016 2203   GLUCOSEU NEGATIVE 03/17/2016 2203   HGBUR SMALL (A)  03/17/2016 2203   [place] 03/17/2016 2203   BILIRUBINUR neg 10/31/2011 1121   KETONESUR NEGATIVE 03/17/2016 2203   PROTEINUR 30 (A) 03/17/2016 2203   UROBILINOGEN 2.0 (H) 08/09/2012 1316   NITRITE NEGATIVE 03/17/2016 2203   LEUKOCYTESUR NEGATIVE 03/17/2016 2203    ----------------------------------------------------------------------------------------------------------------   Imaging results:   Mr Lumbar Spine Wo Contrast  Result Date: 12/18/2017 CLINICAL DATA:  Persistent left calf pain and cramping. Progressive leg weakness. Prior lumbar microdiskectomy. EXAM: MRI LUMBAR SPINE WITHOUT CONTRAST TECHNIQUE: Multiplanar, multisequence MR imaging of the lumbar spine was performed. No intravenous contrast was administered. COMPARISON:  01/14/2016 FINDINGS: Segmentation:  Normal. Alignment: Unchanged slight retrolisthesis of L5 on S1. Mild straightening of the normal lumbar lordosis. Vertebrae: Unchanged prominent L3 superior endplate Schmorl's node. No acute fracture or suspicious osseous lesion. Conus medullaris and cauda equina: Conus extends to the L1-2 level. Conus and cauda equina appear normal. Paraspinal and other soft tissues: Partially visualized T2 hyperintense lesions are again noted in both  kidneys measuring up to 3 cm in size on the left, likely cysts. Similar appearance of 4.3 cm infrarenal abdominal aortic aneurysm. Disc levels: Disc desiccation throughout the lumbar spine. Moderate to severe disc space narrowing at L4-5 with milder narrowing at L2-3 and L3-4. T11-12: Only imaged sagittally. Mild disc bulging without stenosis, similar to prior. T12-L1: Tiny central disc extrusion (best seen on sagittal series 2, image 9) without stenosis. L1-2: Mild facet arthrosis without disc herniation or stenosis. L2-3: Mild disc bulging and left greater than right facet arthrosis without stenosis, unchanged. L3-4: Mild disc bulging greater to the left and mild facet hypertrophy without stenosis, not significantly changed. L4-5: Prior left laminectomy again noted. Circumferential disc bulging and mild facet hypertrophy result in moderate bilateral neural foraminal stenosis and mild-to-moderate right lateral recess stenosis without spinal stenosis, stable to minimally progressed. L5-S1: Circumferential disc bulging and moderate facet arthrosis result in severe left and moderate to severe right neural foraminal stenosis, unchanged. A superimposed central disc protrusion is unchanged without significant lateral recess or spinal stenosis. IMPRESSION: 1. Stable to minimally progressive disc degeneration at L4-5 with moderate bilateral neural foraminal stenosis and mild-to-moderate right lateral recess stenosis. 2. Unchanged disc and facet degeneration elsewhere including at L5-S1 where there is severe neural foraminal stenosis. 3. Unchanged 4.3 cm infrarenal abdominal aortic aneurysm. Electronically Signed   By: Logan Bores M.D.   On: 12/18/2017 16:28   Dg Chest Portable 1 View  Result Date: 01/11/2018 CLINICAL DATA:  84 year old male with a history of shortness of breath and chest pain EXAM: PORTABLE CHEST 1 VIEW COMPARISON:  07/04/2017 FINDINGS: Cardiomediastinal silhouette unchanged in size and contour.  Surgical changes of median sternotomy and CABG. New reticulonodular opacity of the left lung, predominantly mid lung and lower lung partially obscuring the left hemidiaphragm in the left heart border. No pneumothorax. Lesser degree of reticular opacity at the right lung base. No displaced fracture IMPRESSION: Multifocal pneumonia, greater on the left. Surgical changes of median sternotomy and CABG. Electronically Signed   By: Corrie Mckusick D.O.   On: 01/11/2018 10:33    My personal review of EKG: Normal sinus rhythm at 95 bpm with right bundle branch block and left posterior fascicular block  Assessment & Plan  Acute hypoxemic respiratory failure Placed on BiPAP Nebulizer treatments  Multifocal pneumonia Start vancomycin and cefepime Patient is status post Zithromax Check sputum Gram stain and culture and check antigen  Acute coronary syndrome Consult cardiology to follow, done  History of  coronary artery disease Status post CABG in the past Continue with Coreg  Congestive heart failure, systolic with echocardiogram in 2018 showing ejection fraction 35 to 40% Continue with Coreg Continue with losartan and Lasix  COPD Continue with steroids and nebulizer treatment  Hyperlipidemia Continue statins    DVT Prophylaxis Lovenox  AM Labs Ordered, also please review Full Orders  Family Communication: Admission, patients condition and plan of care including tests being ordered have been discussed with the patient and wife who indicate understanding and agree with the plan and Code Status.  Code Status full  Disposition Plan: Home  Time spent in minutes : 48 minutes  Condition GUARDED   @SIGNATURE @

## 2018-01-11 NOTE — ED Notes (Signed)
Pt taken off of Bi pap for trial per Dr. Alvino Chapel. Will continue to monitor closely.

## 2018-01-11 NOTE — Progress Notes (Signed)
01/11/2018 Per wife patient have not voided since 8 am at home. Bladder scan was performed >397 in bladder. Dr Laren Everts was made aware and order for foley cath was given for urinary retention. Rico Sheehan RN

## 2018-01-11 NOTE — ED Notes (Signed)
Pt at 87% on 4L [place], back on BiPap per Dr. Alvino Chapel.

## 2018-01-11 NOTE — Consult Note (Signed)
Cardiology Consultation:   Patient ID: NORFLEET CAPERS MRN: [id number]; DOB: 1934-01-01  Admit date: 01/11/2018 Date of Consult: 01/11/2018  Primary Care Provider: [name], MD Primary Cardiologist: Kirk Ruths, MD  Patient Profile:   Joseph Berg is a 84 y.o. male with a hx of CAD s/p redo CABG, ICM, chronic systolic CHF, bilateral carotid artery disease, HTN, HLD, abdominal aortic aneurysm and OSA  who is being seen today for the evaluation of elevated troponin at the request of Dr. Laren Everts.  Patient had a myocardial infarction followed by coronary artery bypassing graft in 1994 in [place]. Cardiac catheterization in June of 2014 showed a 90% left main, occluded LAD, normal ramus, occluded circumflex and occluded right coronary artery. The LIMA to the LAD was patent. Ejection fraction was 35-40%. The patient underwent redo coronary artery bypass and graft in July of 2014 with a saphenous vein graft to the ramus and a saphenous vein graft to the PDA. ABIs October 2016 showed moderate insufficiency on the right and normal left.  Echo repeated February 2018 and showed ejection fraction 67-20%, grade 1 diastolic dysfunction, moderately reduced RV function and mild tricuspid regurgitation.  Abdominal ultrasound June 2018 showed 4.1 cm aneurysm.  Carotid Dopplers June 2018 showed less than 50% bilateral stenosis.    He was doing well on cardiac standpoint when last seen by Dr. Stanford Breed 06/2017. Abdominal ultrasound showed 4.3 cm aneurysm.  History of Present Illness:   [name] was in usual state of health up until few days ago when started to having cough, congestion and sore throat.  Patient was seen by PCP 2 days ago and started on antibiotic (azithromycin) and prednisone prophylactically.  Noted hypoxia at home on pulse oximetry.  Patient was seen in [facility] for shortness of breath and noted hypoxia.  Given Solu-Medrol and transferred to [facility].  Chest x-ray showed multifocal pneumonia.  BNP 296.  Troponin I 0.19.  Lactic acid normal.  Creatinine 1.57.  EKG shows sinus rhythm at rate of 95 bpm with chronic right bundle branch block, no acute abnormality-personally reviewed.  Cardiology is asked for evaluation of chest pain however patient denies any episode of chest pain.  No symptoms concerning for angina or similar symptoms he had prior to his CABG.  Mostly bedridden due to obesity and multiple orthopedic problems.  Recently noted lower extremity edema and orthopnea.  Compliant with low-sodium diet.  Currently breathing comfortably on BiPAP.  Past Medical History:  Diagnosis Date  . BPH (benign prostatic hyperplasia)   . CAD (coronary artery disease)    a. s/p CABG x1 with LIMA-LAD 1994. b. LM & 3v CAD by cath 07/2012  . CHF (congestive heart failure) ([place])    a. EF 35-40% by echo 07/2012.  [name] Complication of anesthesia    affected patients memory. Pt stated "I couldn't remember anything for three months...just bits and pieces"  . Emphysema    a. Moderate emphysema by CT 06/2012.  [name] Heart attack ([place]) 1994  . Hyperlipidemia   . Hypertension   . Hypoparathyroidism ([place])   . NSVT (nonsustained ventricular tachycardia) ([place])    a. Seen on tele 07/2012.  . OSA on CPAP    pt stated he does not wear CPAP because he no longer has sleep apnea  . Pneumonia   . Pseudoaneurysm of left femoral artery ([place])    a. post cath s/p compression 07/2012, associated w/ anemia.  . Sciatica 2012   Qualifier:  Diagnosis of  By: Madilyn Fireman MD, Barnetta Chapel      Past Surgical History:  Procedure Laterality Date  . CATARACT EXTRACTION, BILATERAL    . COLONOSCOPY    . CORONARY ARTERY BYPASS GRAFT  03-23-92  . CORONARY ARTERY BYPASS GRAFT N/A 08/14/2012   Procedure: REDO CORONARY ARTERY BYPASS GRAFTING (CABG);  Surgeon: Gaye Pollack, MD;  Location: [facility];  Service: Open Heart Surgery;  Laterality: N/A;  . INTRAOPERATIVE TRANSESOPHAGEAL  ECHOCARDIOGRAM N/A 08/14/2012   Procedure: INTRAOPERATIVE TRANSESOPHAGEAL ECHOCARDIOGRAM;  Surgeon: Gaye Pollack, MD;  Location: [facility];  Service: Open Heart Surgery;  Laterality: N/A;  . LUMBAR LAMINECTOMY/DECOMPRESSION MICRODISCECTOMY Left 02/18/2015   Procedure: LUMBAR LAMINECTOMY/DECOMPRESSION MICRODISCECTOMY- LEFT FOUR-FIVE ;  Surgeon: Ashok Pall, MD;  Location: [facility];  Service: Neurosurgery;  Laterality: Left;     Inpatient Medications: Scheduled Meds: . albuterol  2.5 mg Nebulization Q6H  . aspirin  81 mg Oral Daily  . carvedilol  12.5 mg Oral BID WC  . cilostazol  100 mg Oral BID  . enoxaparin (LOVENOX) injection  30 mg Subcutaneous Q24H  . finasteride  5 mg Oral Daily  . furosemide  40 mg Oral Daily  . ipratropium  0.5 mg Nebulization Q6H  . losartan  50 mg Oral Daily  . methylPREDNISolone (SOLU-MEDROL) injection  60 mg Intravenous Q8H  . rosuvastatin  40 mg Oral Daily  . sodium chloride flush  3 mL Intravenous Q12H   Continuous Infusions: . sodium chloride    . ceFEPime (MAXIPIME) IV    . vancomycin     PRN Meds: sodium chloride, acetaminophen, albuterol, HYDROcodone-acetaminophen, HYDROcodone-homatropine, ondansetron **OR** ondansetron (ZOFRAN) IV, sodium chloride flush  Allergies:    Allergies  Allergen Reactions  . Hydrochlorothiazide Other (See Comments)    Affected renal function.  "affected kidneys and calcium level"    Social History:   Social History   Socioeconomic History  . Marital status: Married    Spouse name: Not on file  . Number of children: Not on file  . Years of education: Not on file  . Highest education level: Not on file  Occupational History  . Not on file  Social Needs  . Financial resource strain: Not on file  . Food insecurity:    Worry: Not on file    Inability: Not on file  . Transportation needs:    Medical: Not on file    Non-medical: Not on file  Tobacco Use  . Smoking status: Current Some Day Smoker     Packs/day: 1.00    Years: 60.00    Pack years: 60.00    Last attempt to quit: 10/08/2010    Years since quitting: 7.2  . Smokeless tobacco: Never Used  Substance and Sexual Activity  . Alcohol use: Yes    Comment: Occasional  . Drug use: No  . Sexual activity: Not on file    Comment: works part-time, [employer] completed H, married, no children, regular exercise.S  Lifestyle  . Physical activity:    Days per week: Not on file    Minutes per session: Not on file  . Stress: Not on file  Relationships  . Social connections:    Talks on phone: Not on file    Gets together: Not on file    Attends religious service: Not on file    Active member of club or organization: Not on file    Attends meetings of clubs or organizations: Not on file    Relationship  status: Not on file  . Intimate partner violence:    Fear of current or ex partner: Not on file    Emotionally abused: Not on file    Physically abused: Not on file    Forced sexual activity: Not on file  Other Topics Concern  . Not on file  Social History Narrative  . Not on file    Family History:    Family History  Problem Relation Age of Onset  . Heart attack Father 55  . Stroke Brother 75  . Hypertension Brother   . Hyperlipidemia Brother      ROS:  Please see the history of present illness.  All other ROS reviewed and negative.     Physical Exam/Data:   Vitals:   01/11/18 1330 01/11/18 1345 01/11/18 1400 01/11/18 1415  BP: 114/75 118/63 116/63 114/79  Pulse: 72 72 74 72  Resp: (!) 23 (!) 22 (!) 21 20  SpO2: 92% 92% 92% 91%    Intake/Output Summary (Last 24 hours) at 01/11/2018 1453 Last data filed at 01/11/2018 1246 Gross per 24 hour  Intake 100 ml  Output -  Net 100 ml   General: Morbidly obese male on BiPAP HEENT: normal Lymph: no adenopathy Neck: no JVD Endocrine:  No thryomegaly Vascular: No carotid bruits; FA pulses 2+ bilaterally without bruits  Cardiac:  normal S1, S2; RRR; no  murmur  Lungs:  clear to auscultation bilaterally, no wheezing, rhonchi or rales  Abd: soft, nontender, no hepatomegaly  Ext: 1+ BL LE edema Musculoskeletal:  No deformities, BUE and BLE strength normal and equal Skin: warm and dry  Neuro:  CNs 2-12 intact, no focal abnormalities noted Psych:  Normal affect   Relevant CV Studies: As summarized above  Laboratory Data:  Chemistry Recent Labs  Lab 01/11/18 0935  NA 141  K 4.3  CL 102  CO2 28  GLUCOSE 147*  BUN 23  CREATININE 1.57*  CALCIUM 9.1  GFRNONAA 40*  GFRAA 47*  ANIONGAP 11    Recent Labs  Lab 01/11/18 0935  PROT 5.6*  ALBUMIN 3.1*  AST 22  ALT 14  ALKPHOS 39  BILITOT 1.0   Hematology Recent Labs  Lab 01/11/18 0935  WBC 14.5*  RBC 4.66  HGB 13.8  HCT 43.4  MCV 93.1  MCH 29.6  MCHC 31.8  RDW 13.3  PLT 160   Cardiac Enzymes Recent Labs  Lab 01/11/18 0935  TROPONINI 0.19*   No results for input(s): TROPIPOC in the last 168 hours.  BNP Recent Labs  Lab 01/11/18 0935  BNP 296.6*    DDimer No results for input(s): DDIMER in the last 168 hours.  Radiology/Studies:  Dg Chest Portable 1 View  Result Date: 01/11/2018 CLINICAL DATA:  84 year old male with a history of shortness of breath and chest pain EXAM: PORTABLE CHEST 1 VIEW COMPARISON:  07/04/2017 FINDINGS: Cardiomediastinal silhouette unchanged in size and contour. Surgical changes of median sternotomy and CABG. New reticulonodular opacity of the left lung, predominantly mid lung and lower lung partially obscuring the left hemidiaphragm in the left heart border. No pneumothorax. Lesser degree of reticular opacity at the right lung base. No displaced fracture IMPRESSION: Multifocal pneumonia, greater on the left. Surgical changes of median sternotomy and CABG. Electronically Signed   By: Corrie Mckusick D.O.   On: 01/11/2018 10:33    Assessment and Plan:   1. Elevated troponin with history of CAD status post redo CABG in 2014 -Troponin I of  0.19.  Patient denies any symptoms concerning of angina or symptoms similar to prior to his CABG.  EKG without acute ischemic changes.  Cycle troponin.  Likely demand in setting of multifocal pneumonia.  No need for IV heparin unless significant rate is in troponin level. -Continue aspirin, statin and beta-blocker.  2.  Chronic systolic heart failure/ischemic cardiomyopathy -mild volume overload by exam.  BNP 300.  Continue home dose of Coreg and losartan. -Lasix IV 40 mg x 1 given in the ER this morning.  Resume home dose of Lasix tomorrow. -Follow renal function closely with strict INO and daily weight. -Pending repeat echocardiogram this admission.  3.  AAA -Abdominal ultrasound 06/2017 showed 4.3 cm aneurysm.   4.  Hypertension -Blood pressure stable on home medication.  5.  Multifocal pneumonia -Per primary team  6.  Bilateral carotid artery disease -Continue aspirin and statin.  7.  Hyperlipidemia -07/11/2017: Cholesterol 104; HDL 35; LDL Cholesterol (Calc) 46; Triglycerides 145  - Continue statin   For questions or updates, please contact [facility] Please consult [email or web address] for contact info under     Jarrett Soho, PA  01/11/2018 2:53 PM

## 2018-01-11 NOTE — ED Triage Notes (Signed)
Pt from [facility]. Sats 84% given 2 duonebs and 125mg  solumedrol. Cough since Thanksgiving. Pt has COPD. Not on home oxygen. 4L [place] 92%.

## 2018-01-11 NOTE — ED Notes (Signed)
RT contacted to place pt Bi-pap as ordered

## 2018-01-11 NOTE — ED Provider Notes (Signed)
[facility] EMERGENCY DEPARTMENT Provider Note   CSN: [id number] Arrival date & time: 01/11/18  6606     History   Chief Complaint Chief Complaint  Patient presents with  . Shortness of Breath    HPI Joseph Berg is a 84 y.o. male.  HPI Patient resents with shortness of breath over the last few days.  Since Thanksgiving weight is up.  Went to [place] urgent care and found to have sats in the 26s.  Given breathing treatments Solu-Medrol.  Not on oxygen at home but has nebulizer at home.  History of COPD and CHF.  No chest pain.  Has increased swelling in his legs.  No fevers.  Seen 2 days ago PCP and started on azithromycin and steroids.  Had more of a cough at that time but states that is cleared up some. Past Medical History:  Diagnosis Date  . BPH (benign prostatic hyperplasia)   . CAD (coronary artery disease)    a. s/p CABG x1 with LIMA-LAD 1994. b. LM & 3v CAD by cath 07/2012  . CHF (congestive heart failure) ([place])    a. EF 35-40% by echo 07/2012.  [name] Complication of anesthesia    affected patients memory. Pt stated "I couldn't remember anything for three months...just bits and pieces"  . Emphysema    a. Moderate emphysema by CT 06/2012.  [name] Heart attack ([place]) 1994  . Hyperlipidemia   . Hypertension   . Hypoparathyroidism ([place])   . NSVT (nonsustained ventricular tachycardia) ([place])    a. Seen on tele 07/2012.  . OSA on CPAP    pt stated he does not wear CPAP because he no longer has sleep apnea  . Pneumonia   . Pseudoaneurysm of left femoral artery ([place])    a. post cath s/p compression 07/2012, associated w/ anemia.  . Sciatica 2012   Qualifier: Diagnosis of  By: Madilyn Fireman MD, [place]      Patient Active Problem List   Diagnosis Date Noted  . Primary osteoarthritis of right shoulder 01/27/2017  . Bilateral calf pain 01/27/2017  . Trochanteric bursitis of both hips 12/28/2016  . Demand ischemia ([place])   . AKI (acute kidney injury) ([place])  03/17/2016  . Hyperglycemia 03/17/2016  . Postlaminectomy syndrome, lumbar region 12/14/2015  . COPD exacerbation ([place]) 03/27/2015  . HNP (herniated nucleus pulposus), lumbar 02/18/2015  . Claudication of right lower extremity ([place]) 12/09/2014  . Lumbar degenerative disc disease 05/13/2014  . Mixed restrictive and obstructive lung disease ([place]) 01/16/2014  . Cerebrovascular disease 06/26/2013  . AAA (abdominal aortic aneurysm) without rupture ([place]) 06/24/2013  . Aortic calcification ([place]) 06/10/2013  . S/P CABG x 2 08/17/2012  . Cardiomyopathy, ischemic 07/25/2012  . Chronic systolic congestive heart failure ([place]) 07/11/2012  . CAD (coronary artery disease) of artery bypass graft 07/06/2012  . OSA (obstructive sleep apnea) 01/24/2012  . BPH (benign prostatic hyperplasia) 03/02/2011  . History of tobacco abuse 08/23/2010  . Hypoparathyroidism ([place]) 07/30/2010  . SCIATICA 11/10/2009  . INSULIN RESISTANCE SYNDROME 04/16/2007  . HYPERLIPIDEMIA NEC/NOS 11/14/2006  . HYPERTENSION, BENIGN ESSENTIAL 11/14/2006    Past Surgical History:  Procedure Laterality Date  . CATARACT EXTRACTION, BILATERAL    . COLONOSCOPY    . CORONARY ARTERY BYPASS GRAFT  03-23-92  . CORONARY ARTERY BYPASS GRAFT N/A 08/14/2012   Procedure: REDO CORONARY ARTERY BYPASS GRAFTING (CABG);  Surgeon: Gaye Pollack, MD;  Location: [facility];  Service: Open Heart Surgery;  Laterality: N/A;  . INTRAOPERATIVE  TRANSESOPHAGEAL ECHOCARDIOGRAM N/A 08/14/2012   Procedure: INTRAOPERATIVE TRANSESOPHAGEAL ECHOCARDIOGRAM;  Surgeon: Gaye Pollack, MD;  [facility];  Service: Open Heart Surgery;  Laterality: N/A;  . LUMBAR LAMINECTOMY/DECOMPRESSION MICRODISCECTOMY Left 02/18/2015   Procedure: LUMBAR LAMINECTOMY/DECOMPRESSION MICRODISCECTOMY- LEFT FOUR-FIVE ;  Surgeon: Ashok Pall, MD;  Location: [facility] NEURO ORS;  Service: Neurosurgery;  Laterality: Left;        Home Medications    Prior to Admission medications   Medication Sig  Start Date End Date Taking? Authorizing Provider  acetaminophen (TYLENOL) 500 MG tablet Take 1-2 tablets (500-1,000 mg total) by mouth every 6 (six) hours as needed for pain. 08/03/12  Yes Dunn, Nedra Hai, PA-C  Ascorbic Acid (VITAMIN C) 1000 MG tablet Take 1,000 mg by mouth daily.   Yes [provider]  aspirin 81 MG tablet Take 81 mg by mouth daily.   Yes [provider]  carvedilol (COREG) 12.5 MG tablet Take 1 tablet (12.5 mg total) by mouth 2 (two) times daily with a meal. 07/05/17  Yes Crenshaw, Denice Bors, MD  celecoxib (CELEBREX) 200 MG capsule TAKE 1 TO 2 CAPSULES BY MOUTH EVERY DAY AS NEEDED FOR PAIN Patient taking differently: Take 200-400 mg by mouth as needed.  07/28/17  Yes Silverio Decamp, MD  Cholecalciferol (VITAMIN D PO) Take 3,000 mg by mouth daily.   Yes [provider]  Cyanocobalamin (VITAMIN B-12 PO) Take 1 tablet by mouth daily.   Yes [provider]  furosemide (LASIX) 40 MG tablet Take 1 tablet (40 mg total) by mouth daily. 07/05/17  Yes Lelon Perla, MD  losartan (COZAAR) 25 MG tablet Take 50 mg by mouth daily. 12/22/17  Yes [provider]  Multiple Vitamin (MULTIVITAMIN) capsule Take 1 capsule by mouth daily.   Yes [provider]  AMBULATORY NON FORMULARY MEDICATION Nebulizer - use as needed - with supplies.  Diagnosis of COPD J44.1 09/19/17   [name], MD  AMBULATORY NON FORMULARY MEDICATION Knee-high, medium compression, graduated compression stockings. Apply to lower extremities. [email or web address], Zippered Compression Stockings, medium circ, long length 11/15/17   Silverio Decamp, MD  azithromycin (ZITHROMAX) 250 MG tablet 2 Ttabs PO on Day 1, then one a day x 4 days. 01/09/18 01/14/18  [name], MD  cilostazol (PLETAL) 100 MG tablet Take 1 tablet (100 mg total) by mouth 2 (two) times daily. 11/15/17   Silverio Decamp, MD  finasteride (PROSCAR) 5 MG tablet TAKE 1 TABLET  BY MOUTH EVERY DAY Patient taking differently: Take 5 mg by mouth daily.  10/10/17   [name], MD  Fluticasone-Umeclidin-Vilant (TRELEGY ELLIPTA) 100-62.5-25 MCG/INH AEPB Inhale 1 Inhaler into the lungs daily. 01/09/18   [name], MD  HYDROcodone-homatropine (HYCODAN) 5-1.5 MG/5ML syrup Take 5 mLs by mouth at bedtime as needed for cough. 01/09/18   [name], MD  ipratropium-albuterol (DUONEB) 0.5-2.5 (3) MG/3ML SOLN INHALE 1 VIAL (3ML) VIA NEBULIZER EVERY 2 HOURS AS NEEDED FOR WHEEZE OR SHORTNESS OF BREATH Patient not taking: Reported on 01/11/2018 07/21/17   [name], MD  Nebulizer MISC Generic nebulizer plus supplies 03/26/15   Emeterio Reeve, DO  predniSONE (DELTASONE) 20 MG tablet Take 2 tablets (40 mg total) by mouth daily with breakfast. 01/09/18   [name], MD  Probiotic Product (TRUBIOTICS PO) Take 1 tablet by mouth daily.    [provider]  rosuvastatin (CRESTOR) 40 MG tablet Take 1 tablet (40 mg total) by mouth daily. 11/27/17  Lelon Perla, MD  VENTOLIN HFA 108 (90 Base) MCG/ACT inhaler TAKE 2 PUFFS BY MOUTH EVERY 6 HOURS AS NEEDED FOR WHEEZE OR SHORTNESS OF BREATH Patient taking differently: Inhale 2 puffs into the lungs every 6 (six) hours as needed.  12/28/17   [name], MD    Family History Family History  Problem Relation Age of Onset  . Heart attack Father 20  . Stroke Brother 42  . Hypertension Brother   . Hyperlipidemia Brother     Social History Social History   Tobacco Use  . Smoking status: Current Some Day Smoker    Packs/day: 1.00    Years: 60.00    Pack years: 60.00    Last attempt to quit: 10/08/2010    Years since quitting: 7.2  . Smokeless tobacco: Never Used  Substance Use Topics  . Alcohol use: Yes    Comment: Occasional  . Drug use: No     Allergies   Hydrochlorothiazide   Review of Systems Review of Systems  Constitutional: Negative for appetite  change.  HENT: Negative for congestion.   Respiratory: Positive for cough and shortness of breath.   Cardiovascular: Negative for chest pain and leg swelling.  Gastrointestinal: Negative for anal bleeding.  Genitourinary: Negative for flank pain.  Musculoskeletal: Negative for back pain.  Skin: Negative for rash.  Neurological: Negative for seizures and weakness.  Psychiatric/Behavioral: Negative for confusion.     Physical Exam Updated Vital Signs BP 117/74   Pulse 76   Resp (!) 29   SpO2 94%   Physical Exam  Constitutional: He appears well-developed.  Neck: Neck supple.  Cardiovascular: Normal rate.  Pulmonary/Chest:  Diffuse harsh breath sounds with some tachypnea and hypoxia.  Abdominal: There is no tenderness.  Musculoskeletal:       Right lower leg: He exhibits edema.       Left lower leg: He exhibits edema.  Moderate to severe pitting edema bilateral lower extremities.  Neurological: He is alert.  Skin: Capillary refill takes less than 2 seconds.     ED Treatments / Results  Labs (all labs ordered are listed, but only abnormal results are displayed) Labs Reviewed  COMPREHENSIVE METABOLIC PANEL - Abnormal; Notable for the following components:      Result Value   Glucose, Bld 147 (*)    Creatinine, Ser 1.57 (*)    Total Protein 5.6 (*)    Albumin 3.1 (*)    GFR calc non Af Amer 40 (*)    GFR calc Af Amer 47 (*)    All other components within normal limits  TROPONIN I - Abnormal; Notable for the following components:   Troponin I 0.19 (*)    All other components within normal limits  BRAIN NATRIURETIC PEPTIDE - Abnormal; Notable for the following components:   B Natriuretic Peptide 296.6 (*)    All other components within normal limits  CBC WITH DIFFERENTIAL/PLATELET - Abnormal; Notable for the following components:   WBC 14.5 (*)    Neutro Abs 11.9 (*)    All other components within normal limits  CULTURE, BLOOD (ROUTINE X 2)  CULTURE, BLOOD (ROUTINE  X 2)  I-STAT CG4 LACTIC ACID, ED  I-STAT CG4 LACTIC ACID, ED    EKG EKG Interpretation  Date/Time:  Thursday January 11 2018 09:35:38 EST Ventricular Rate:  95 PR Interval:    QRS Duration: 164 QT Interval:  407 QTC Calculation: 512 R Axis:   93 Text Interpretation:  Sinus rhythm RBBB  and LPFB Confirmed by Davonna Belling [phone]) on 01/11/2018 9:53:38 AM   Radiology Dg Chest Portable 1 View  Result Date: 01/11/2018 CLINICAL DATA:  84 year old male with a history of shortness of breath and chest pain EXAM: PORTABLE CHEST 1 VIEW COMPARISON:  07/04/2017 FINDINGS: Cardiomediastinal silhouette unchanged in size and contour. Surgical changes of median sternotomy and CABG. New reticulonodular opacity of the left lung, predominantly mid lung and lower lung partially obscuring the left hemidiaphragm in the left heart border. No pneumothorax. Lesser degree of reticular opacity at the right lung base. No displaced fracture IMPRESSION: Multifocal pneumonia, greater on the left. Surgical changes of median sternotomy and CABG. Electronically Signed   By: Corrie Mckusick D.O.   On: 01/11/2018 10:33    Procedures Procedures (including critical care time)  Medications Ordered in ED Medications  cefTRIAXone (ROCEPHIN) 1 g in sodium chloride 0.9 % 100 mL IVPB (1 g Intravenous New Bag/Given 01/11/18 1211)  furosemide (LASIX) injection 40 mg (40 mg Intravenous Given 01/11/18 1040)     Initial Impression / Assessment and Plan / ED Course  I have reviewed the triage vital signs and the nursing notes.  Pertinent labs & imaging results that were available during my care of the patient were reviewed by me and considered in my medical decision making (see chart for details).     CRITICAL CARE Performed by: Davonna Belling Total critical care time: 30 minutes Critical care time was exclusive of separately billable procedures and treating other patients. Critical care was necessary to treat or prevent  imminent or life-threatening deterioration. Critical care was time spent personally by me on the following activities: development of treatment plan with patient and/or surrogate as well as nursing, discussions with consultants, evaluation of patient's response to treatment, examination of patient, obtaining history from patient or surrogate, ordering and performing treatments and interventions, ordering and review of laboratory studies, ordering and review of radiographic studies, pulse oximetry and re-evaluation of patient's condition.   Patient presents with shortness of breath.  Has had for last few days.  Given antibiotics by PCP.  However went in today to urgent care and found to be hypoxic with sats in the 60s.  X-ray shows pneumonia but is caring fair amount of fluid on his legs.  BNP is somewhat elevated.  Initial hypotension has improved.  Normal lactic acid.  Has had increased sputum production.  However at this point requires BiPAP.  Feels much better on it and will desat into the upper 80s without it.  Does not have oxygen at home.  Will admit to stepdown bed. Does have mild decrease in creatinine.  Normal lactic acid.  Blood pressure improved.  Initially not called code sepsis due to lack of fever and somewhat atypical presentation with presentation clouded by 10 pound weight gain and edema.  Final Clinical Impressions(s) / ED Diagnoses   Final diagnoses:  Multifocal pneumonia  Acute respiratory failure with hypoxia (HCC)  Congestive heart failure, unspecified HF chronicity, unspecified heart failure type [facility])    ED Discharge Orders    None       Davonna Belling, MD 01/11/18 1221

## 2018-01-11 NOTE — ED Provider Notes (Signed)
[facility] CARE    CSN: [id number] Arrival date & time: 01/11/18  1025     History   Chief Complaint Chief Complaint  Patient presents with  . Shortness of Breath    HPI Joseph Berg is a 84 y.o. male.   HPI  Joseph Berg is a 84 y.o. male presenting to UC with wife c/o sudden onset SOB with a home O2 Sat 74% on RA.  Hx of COPD. He was seen by his PCP on 01/09/18 for a COPD exacerbation that started 2 days prior with cough and fever.  He was started on oral prednisone and azithromycin. He was told his lungs were clear on 01/09/18.  He has taken as prescribed except for today.  He tried his albuterol nebulizer at 5:30AM but no relief of SOB.  Denies chest pain. He dose have a hx of CHF and CAD with last open-heart surgery in 2014. He is followed by Dr. Stanford Breed with cardiology.  Denies leg pain or swelling. Denies hx of DVT or PE.    Past Medical History:  Diagnosis Date  . BPH (benign prostatic hyperplasia)   . CAD (coronary artery disease)    a. s/p CABG x1 with LIMA-LAD 1994. b. LM & 3v CAD by cath 07/2012  . CHF (congestive heart failure) ([place])    a. EF 35-40% by echo 07/2012.  [name] Complication of anesthesia    affected patients memory. Pt stated "I couldn't remember anything for three months...just bits and pieces"  . Emphysema    a. Moderate emphysema by CT 06/2012.  [name] Heart attack ([place]) 1994  . Hyperlipidemia   . Hypertension   . Hypoparathyroidism ([place])   . NSVT (nonsustained ventricular tachycardia) ([place])    a. Seen on tele 07/2012.  . OSA on CPAP    pt stated he does not wear CPAP because he no longer has sleep apnea  . Pneumonia   . Pseudoaneurysm of left femoral artery ([place])    a. post cath s/p compression 07/2012, associated w/ anemia.  . Sciatica 2012   Qualifier: Diagnosis of  By: Madilyn Fireman MD, [place]      Patient Active Problem List   Diagnosis Date Noted  . Primary osteoarthritis of right shoulder 01/27/2017  . Bilateral calf pain  01/27/2017  . Trochanteric bursitis of both hips 12/28/2016  . Demand ischemia ([place])   . AKI (acute kidney injury) ([place]) 03/17/2016  . Hyperglycemia 03/17/2016  . Postlaminectomy syndrome, lumbar region 12/14/2015  . COPD exacerbation ([place]) 03/27/2015  . HNP (herniated nucleus pulposus), lumbar 02/18/2015  . Claudication of right lower extremity ([place]) 12/09/2014  . Lumbar degenerative disc disease 05/13/2014  . Mixed restrictive and obstructive lung disease ([place]) 01/16/2014  . Cerebrovascular disease 06/26/2013  . AAA (abdominal aortic aneurysm) without rupture ([place]) 06/24/2013  . Aortic calcification ([place]) 06/10/2013  . S/P CABG x 2 08/17/2012  . Cardiomyopathy, ischemic 07/25/2012  . Chronic systolic congestive heart failure ([place]) 07/11/2012  . CAD (coronary artery disease) of artery bypass graft 07/06/2012  . OSA (obstructive sleep apnea) 01/24/2012  . BPH (benign prostatic hyperplasia) 03/02/2011  . History of tobacco abuse 08/23/2010  . Hypoparathyroidism ([place]) 07/30/2010  . SCIATICA 11/10/2009  . INSULIN RESISTANCE SYNDROME 04/16/2007  . HYPERLIPIDEMIA NEC/NOS 11/14/2006  . HYPERTENSION, BENIGN ESSENTIAL 11/14/2006    Past Surgical History:  Procedure Laterality Date  . CATARACT EXTRACTION, BILATERAL    . COLONOSCOPY    . CORONARY ARTERY BYPASS GRAFT  03-23-92  . CORONARY  ARTERY BYPASS GRAFT N/A 08/14/2012   Procedure: REDO CORONARY ARTERY BYPASS GRAFTING (CABG);  Surgeon: Gaye Pollack, MD;  Location: [facility];  Service: Open Heart Surgery;  Laterality: N/A;  . INTRAOPERATIVE TRANSESOPHAGEAL ECHOCARDIOGRAM N/A 08/14/2012   Procedure: INTRAOPERATIVE TRANSESOPHAGEAL ECHOCARDIOGRAM;  Surgeon: Gaye Pollack, MD;  Location: [facility] OR;  Service: Open Heart Surgery;  Laterality: N/A;  . LUMBAR LAMINECTOMY/DECOMPRESSION MICRODISCECTOMY Left 02/18/2015   Procedure: LUMBAR LAMINECTOMY/DECOMPRESSION MICRODISCECTOMY- LEFT FOUR-FIVE ;  Surgeon: Ashok Pall, MD;  Location: [facility] OR[facility];   Service: Neurosurgery;  Laterality: Left;       Home Medications    Prior to Admission medications   Medication Sig Start Date End Date Taking? Authorizing Provider  acetaminophen (TYLENOL) 500 MG tablet Take 1-2 tablets (500-1,000 mg total) by mouth every 6 (six) hours as needed for pain. 08/03/12   Dunn, Nedra Hai, PA-C  AMBULATORY NON FORMULARY MEDICATION Nebulizer - use as needed - with supplies.  Diagnosis of COPD J44.1 09/19/17   [name], MD  AMBULATORY NON FORMULARY MEDICATION Knee-high, medium compression, graduated compression stockings. Apply to lower extremities. [email or web address], Zippered Compression Stockings, medium circ, long length 11/15/17   Silverio Decamp, MD  Ascorbic Acid (VITAMIN C) 1000 MG tablet Take 1,000 mg by mouth daily.    [provider]  aspirin 81 MG tablet Take 81 mg by mouth daily.    [provider]  azithromycin (ZITHROMAX) 250 MG tablet 2 Ttabs PO on Day 1, then one a day x 4 days. 01/09/18 01/14/18  [name], MD  carvedilol (COREG) 12.5 MG tablet Take 1 tablet (12.5 mg total) by mouth 2 (two) times daily with a meal. 07/05/17   Crenshaw, Denice Bors, MD  celecoxib (CELEBREX) 200 MG capsule TAKE 1 TO 2 CAPSULES BY MOUTH EVERY DAY AS NEEDED FOR PAIN 07/28/17   Silverio Decamp, MD  Cholecalciferol (VITAMIN D PO) Take 3,000 mg by mouth daily.    [provider]  cilostazol (PLETAL) 100 MG tablet Take 1 tablet (100 mg total) by mouth 2 (two) times daily. 11/15/17   Silverio Decamp, MD  Cyanocobalamin (VITAMIN B-12 PO) Take 1 tablet by mouth daily.    [provider]  finasteride (PROSCAR) 5 MG tablet TAKE 1 TABLET BY MOUTH EVERY DAY 10/10/17   [name], MD  Fluticasone-Umeclidin-Vilant (TRELEGY ELLIPTA) 100-62.5-25 MCG/INH AEPB Inhale 1 Inhaler into the lungs daily. 01/09/18   [name], MD  furosemide (LASIX) 40 MG tablet Take 1 tablet (40 mg total) by mouth  daily. 07/05/17   Lelon Perla, MD  HYDROcodone-homatropine [facility]) 5-1.5 MG/5ML syrup Take 5 mLs by mouth at bedtime as needed for cough. 01/09/18   [name], MD  ipratropium-albuterol (DUONEB) 0.5-2.5 (3) MG/3ML SOLN INHALE 1 VIAL (3ML) VIA NEBULIZER EVERY 2 HOURS AS NEEDED FOR WHEEZE OR SHORTNESS OF BREATH 07/21/17   [name], MD  losartan (COZAAR) 25 MG tablet Take 50 mg by mouth daily. 12/22/17   [provider]  Multiple Vitamin (MULTIVITAMIN) capsule Take 1 capsule by mouth daily.    [provider]  Nebulizer MISC Generic nebulizer plus supplies 03/26/15   Emeterio Reeve, DO  predniSONE (DELTASONE) 20 MG tablet Take 2 tablets (40 mg total) by mouth daily with breakfast. 01/09/18   [name], MD  Probiotic Product (TRUBIOTICS PO) Take 1 tablet by mouth daily.    [provider]  rosuvastatin (CRESTOR) 40 MG tablet Take 1 tablet (40  mg total) by mouth daily. 11/27/17   Lelon Perla, MD  VENTOLIN HFA 108 (90 Base) MCG/ACT inhaler TAKE 2 PUFFS BY MOUTH EVERY 6 HOURS AS NEEDED FOR WHEEZE OR SHORTNESS OF BREATH 12/28/17   [name], MD    Family History Family History  Problem Relation Age of Onset  . Heart attack Father 31  . Stroke Brother 86  . Hypertension Brother   . Hyperlipidemia Brother     Social History Social History   Tobacco Use  . Smoking status: Current Some Day Smoker    Packs/day: 1.00    Years: 60.00    Pack years: 60.00    Last attempt to quit: 10/08/2010    Years since quitting: 7.2  . Smokeless tobacco: Never Used  Substance Use Topics  . Alcohol use: Yes    Comment: Occasional  . Drug use: No     Allergies   Hydrochlorothiazide   Review of Systems Review of Systems  Constitutional: Positive for fatigue. Negative for fever.  Respiratory: Positive for cough, chest tightness, shortness of breath and wheezing.   Cardiovascular: Negative for chest pain, palpitations  and leg swelling.  Gastrointestinal: Negative for diarrhea, nausea and vomiting.     Physical Exam Triage Vital Signs ED Triage Vitals  Enc Vitals Group     BP 01/11/18 0820 107/69     Pulse Rate 01/11/18 0820 (!) 112     Resp 01/11/18 0820 20     Temp 01/11/18 0820 97.9 F (36.6 C)     Temp Source 01/11/18 0820 Oral     SpO2 01/11/18 0820 (!) 67 %     Weight --      Height --      Head Circumference --      Peak Flow --      Pain Score 01/11/18 0826 0     Pain Loc --      Pain Edu? --      Excl. in [place]? --    No data found.  Updated Vital Signs BP 107/69 (BP Location: Right Arm)   Pulse (!) 112   Temp 97.9 F (36.6 C) (Oral)   Resp 20   SpO2 (!) 82%   Visual Acuity Right Eye Distance:   Left Eye Distance:   Bilateral Distance:    Right Eye Near:   Left Eye Near:    Bilateral Near:     Physical Exam  Constitutional: He is oriented to person, place, and time. He appears well-developed and well-nourished.  Elderly male sitting upright in exam chair, appears winded but is alert and oriented.   HENT:  Head: Normocephalic and atraumatic.  Eyes: EOM are normal.  Neck: Normal range of motion. Neck supple.  Cardiovascular: Regular rhythm. Tachycardia present.  Pulmonary/Chest: No stridor. He is in respiratory distress ( winded with limited movement). He has decreased breath sounds. He has rhonchi.  Musculoskeletal: Normal range of motion.       Right lower leg: He exhibits no edema.       Left lower leg: He exhibits no edema.  Neurological: He is alert and oriented to person, place, and time.  Skin: Skin is warm and dry.  Psychiatric: He has a normal mood and affect. His behavior is normal.  Nursing note and vitals reviewed.    UC Treatments / Results  Labs (all labs ordered are listed, but only abnormal results are displayed) Labs Reviewed - No data to display  EKG See scanned EKG,  no change since prior EKG  Radiology No results  found.  Procedures Procedures (including critical care time)  Medications Ordered in UC Medications - No data to display  Initial Impression / Assessment and Plan / UC Course  I have reviewed the triage vital signs and the nursing notes.  Pertinent labs & imaging results that were available during my care of the patient were reviewed by me and considered in my medical decision making (see chart for details).     Pt with hx of COPD and recent URI c/o SOB and home O2 Sat of 74% on RA Pt does not have a CPAP and is not on daily oxygen He used his albuterol nebulizer machine at 5:30AM w/o relief. Pt requesting to be taken to [facility] via EMS Pt's cardiologist is Dr. Stanford Breed.   EMS called.  Final Clinical Impressions(s) / UC Diagnoses   Final diagnoses:  SOB (shortness of breath)  Hypoxia   Discharge Instructions   None    ED Prescriptions    None     Controlled Substance Prescriptions [place] Controlled Substance Registry consulted? Not Applicable   Tyrell Antonio 01/11/18 1848

## 2018-01-11 NOTE — Progress Notes (Signed)
Pharmacy Antibiotic Note  Joseph Berg is a 84 y.o. male admitted on 01/11/2018 with SOB and fever. Planning to start empiric antibiotics. Patient has AKI with SCr 0.9 > 1.57.  Vancomycin 1250 mg IV Q 36 hrs. Goal AUC 400-550. Expected AUC: 484 SCr used: 1.5   Plan: -Vancomycin 2 g IV x1 then 1250 mg IV q36h -Watch SCr, may need to re-calculate vancomycin dose based on improved SCr -Cefepime 2 g IV q24h -Monitor cultures, renal fx, obtain levels as needed    Temp (24hrs), Avg:97.9 F (36.6 C), Min:97.9 F (36.6 C), Max:97.9 F (36.6 C)  Recent Labs  Lab 01/11/18 0935 01/11/18 1155  WBC 14.5*  --   CREATININE 1.57*  --   LATICACIDVEN  --  1.84    Estimated Creatinine Clearance: 42.6 mL/min (A) (by C-G formula based on SCr of 1.57 mg/dL (H)).    Allergies  Allergen Reactions  . Hydrochlorothiazide Other (See Comments)    Affected renal function.  "affected kidneys and calcium level"    Antimicrobials this admission: 12/5 vancomycin > 12/5 cefepime >  Dose adjustments this admission: N/A  Microbiology results: 12/5 blood cx:  Harvel Quale 01/11/2018 2:06 PM

## 2018-01-11 NOTE — ED Triage Notes (Addendum)
Pt c/o productive cough that is now non- productive x 4 days. His O2 sat at home this morning per his wife was 74%. He is taking Zpak, Prednisone, and hycodan for the cough from his PCP x 2 days. He has not taken ABT and prednisone today.

## 2018-01-12 ENCOUNTER — Inpatient Hospital Stay (HOSPITAL_COMMUNITY): Payer: Medicare PPO

## 2018-01-12 DIAGNOSIS — Z951 Presence of aortocoronary bypass graft: Secondary | ICD-10-CM

## 2018-01-12 DIAGNOSIS — I5043 Acute on chronic combined systolic (congestive) and diastolic (congestive) heart failure: Secondary | ICD-10-CM | POA: Diagnosis present

## 2018-01-12 DIAGNOSIS — I361 Nonrheumatic tricuspid (valve) insufficiency: Secondary | ICD-10-CM

## 2018-01-12 LAB — CBC
HCT: 40.8 % (ref 39.0–52.0)
Hemoglobin: 13 g/dL (ref 13.0–17.0)
MCH: 29.3 pg (ref 26.0–34.0)
MCHC: 31.9 g/dL (ref 30.0–36.0)
MCV: 91.9 fL (ref 80.0–100.0)
NRBC: 0 % (ref 0.0–0.2)
Platelets: 145 10*3/uL — ABNORMAL LOW (ref 150–400)
RBC: 4.44 MIL/uL (ref 4.22–5.81)
RDW: 13.4 % (ref 11.5–15.5)
WBC: 14.2 10*3/uL — ABNORMAL HIGH (ref 4.0–10.5)

## 2018-01-12 LAB — ECHOCARDIOGRAM COMPLETE
Height: 69 in
Weight: 3654.34 [oz_av]

## 2018-01-12 LAB — BASIC METABOLIC PANEL
Anion gap: 12 (ref 5–15)
BUN: 33 mg/dL — AB (ref 8–23)
CO2: 26 mmol/L (ref 22–32)
CREATININE: 1.42 mg/dL — AB (ref 0.61–1.24)
Calcium: 9.1 mg/dL (ref 8.9–10.3)
Chloride: 105 mmol/L (ref 98–111)
GFR calc Af Amer: 53 mL/min — ABNORMAL LOW (ref >60)
GFR calc non Af Amer: 46 mL/min — ABNORMAL LOW (ref >60)
Glucose, Bld: 176 mg/dL — ABNORMAL HIGH (ref 70–99)
Potassium: 4.5 mmol/L (ref 3.5–5.1)
SODIUM: 143 mmol/L (ref 135–145)

## 2018-01-12 LAB — INFLUENZA PANEL BY PCR (TYPE A & B)
INFLBPCR: NEGATIVE
Influenza A By PCR: NEGATIVE

## 2018-01-12 LAB — TROPONIN I: Troponin I: 0.18 ng/mL

## 2018-01-12 MED ORDER — PERFLUTREN LIPID MICROSPHERE
1.0000 mL | INTRAVENOUS | Status: AC | PRN
  Administered 2018-01-12: 2 mL via INTRAVENOUS
  Filled 2018-01-12: qty 10

## 2018-01-12 MED ORDER — FUROSEMIDE 40 MG PO TABS
40.0000 mg | ORAL_TABLET | Freq: Every day | ORAL | Status: DC
  Administered 2018-01-12 – 2018-01-13 (×2): 40 mg via ORAL
  Filled 2018-01-12 (×2): qty 1

## 2018-01-12 MED ORDER — IPRATROPIUM-ALBUTEROL 0.5-2.5 (3) MG/3ML IN SOLN
3.0000 mL | Freq: Four times a day (QID) | RESPIRATORY_TRACT | Status: DC
  Administered 2018-01-12 – 2018-01-15 (×15): 3 mL via RESPIRATORY_TRACT
  Filled 2018-01-12 (×14): qty 3

## 2018-01-12 MED ORDER — IPRATROPIUM-ALBUTEROL 0.5-2.5 (3) MG/3ML IN SOLN
RESPIRATORY_TRACT | Status: AC
  Administered 2018-01-12: 3 mL via RESPIRATORY_TRACT
  Filled 2018-01-12: qty 3

## 2018-01-12 MED ORDER — GUAIFENESIN ER 600 MG PO TB12
600.0000 mg | ORAL_TABLET | Freq: Two times a day (BID) | ORAL | Status: DC
  Administered 2018-01-12 (×2): 600 mg via ORAL
  Filled 2018-01-12 (×2): qty 1

## 2018-01-12 MED ORDER — AZITHROMYCIN 250 MG PO TABS
500.0000 mg | ORAL_TABLET | Freq: Every day | ORAL | Status: AC
  Administered 2018-01-12 – 2018-01-17 (×6): 500 mg via ORAL
  Filled 2018-01-12 (×6): qty 2

## 2018-01-12 MED ORDER — FUROSEMIDE 10 MG/ML IJ SOLN: 40.0000 mg | Freq: Every day | INTRAMUSCULAR | Status: DC

## 2018-01-12 NOTE — Progress Notes (Signed)
DAILY PROGRESS NOTE   Patient Name: Joseph Berg Date of Encounter: 01/12/2018  Chief Complaint   Breathing is better today.  Patient Profile   84 yo male admitted with multifocal pneumonia and mildly elevated troponin.  Subjective    Breathing has improved - there is multifocal pneumonia with scant hemoptysis. Labs pending -would rule-out influenza - he did have chills but no documented fever. Echo briefly reviewed at bedside - EF looks better than study in 2015 - maybe 45-50% with inferoapical akinesis - formal read is pending. Troponin flat elevated, c/w demand ischemia.  Objective   Vitals:   01/12/18 0750 01/12/18 0811 01/12/18 0912 01/12/18 0927  BP:  111/63 (!) 111/59   Pulse:  84 81   Resp:  20    Temp:  (!) 97.3 F (36.3 C)    TempSrc:  Oral    SpO2: 92% 90%    Weight:    103.6 kg  Height:        Intake/Output Summary (Last 24 hours) at 01/12/2018 1026 Last data filed at 01/12/2018 0600 Gross per 24 hour  Intake 341.38 ml  Output 300 ml  Net 41.38 ml   Filed Weights   01/11/18 1645 01/12/18 0927  Weight: 105.2 kg 103.6 kg    Physical Exam   General appearance: alert, appears older than stated age and mild distress Neck: JVD - 2 cm above sternal notch, no carotid bruit and thyroid not enlarged, symmetric, no tenderness/mass/nodules Lungs: diminished breath sounds bilaterally, dullness to percussion LLL and rhonchi bilaterally Heart: regular rate and rhythm Abdomen: soft, non-tender; bowel sounds normal; no masses,  no organomegaly and obese Extremities: extremities normal, atraumatic, no cyanosis or edema Pulses: 2+ and symmetric Skin: Skin color, texture, turgor normal. No rashes or lesions Neurologic: Grossly normal Psych: Pleasant  Inpatient Medications    Scheduled Meds: . albuterol  2.5 mg Nebulization Q6H  . aspirin  81 mg Oral Daily  . carvedilol  12.5 mg Oral BID WC  . cilostazol  100 mg Oral BID  . enoxaparin (LOVENOX) injection   40 mg Subcutaneous Q24H  . finasteride  5 mg Oral Daily  . furosemide  40 mg Oral Daily  . ipratropium  0.5 mg Nebulization Q6H  . losartan  50 mg Oral Daily  . methylPREDNISolone (SOLU-MEDROL) injection  60 mg Intravenous Q8H  . rosuvastatin  40 mg Oral q1800  . sodium chloride flush  3 mL Intravenous Q12H    Continuous Infusions: . sodium chloride    . ceFEPime (MAXIPIME) IV Stopped (01/12/18 0700)    PRN Meds: sodium chloride, acetaminophen, albuterol, HYDROcodone-acetaminophen, HYDROcodone-homatropine, ondansetron **OR** ondansetron (ZOFRAN) IV, perflutren lipid microspheres (DEFINITY) IV suspension, sodium chloride flush   Labs   Results for orders placed or performed during the hospital encounter of 01/11/18 (from the past 48 hour(s))  Comprehensive metabolic panel     Status: Abnormal   Collection Time: 01/11/18  9:35 AM  Result Value Ref Range   Sodium 141 135 - 145 mmol/L   Potassium 4.3 3.5 - 5.1 mmol/L   Chloride 102 98 - 111 mmol/L   CO2 28 22 - 32 mmol/L   Glucose, Bld 147 (H) 70 - 99 mg/dL   BUN 23 8 - 23 mg/dL   Creatinine, Ser 1.57 (H) 0.61 - 1.24 mg/dL   Calcium 9.1 8.9 - 10.3 mg/dL   Total Protein 5.6 (L) 6.5 - 8.1 g/dL   Albumin 3.1 (L) 3.5 - 5.0 g/dL   AST  22 15 - 41 U/L   ALT 14 0 - 44 U/L   Alkaline Phosphatase 39 38 - 126 U/L   Total Bilirubin 1.0 0.3 - 1.2 mg/dL   GFR calc non Af Amer 40 (L) >60 mL/min   GFR calc Af Amer 47 (L) >60 mL/min   Anion gap 11 5 - 15    Comment: Performed at [facility] [address] [place], [place] [address]  Troponin I - Once     Status: Abnormal   Collection Time: 01/11/18  9:35 AM  Result Value Ref Range   Troponin I 0.19 (HH) <0.03 ng/mL    Comment: CRITICAL RESULT CALLED TO, READ BACK BY AND VERIFIED WITH: ELIZABETH PRYOR,RN AT 1047 01/11/18 BY ZBEECH. Performed at [facility], [place] [address], [place], [place] [address]   Brain natriuretic peptide     Status: Abnormal   Collection Time:  01/11/18  9:35 AM  Result Value Ref Range   B Natriuretic Peptide 296.6 (H) 0.0 - 100.0 pg/mL    Comment: Performed at [facility] [address], [place], [place] [address]  CBC with Differential     Status: Abnormal   Collection Time: 01/11/18  9:35 AM  Result Value Ref Range   WBC 14.5 (H) 4.0 - 10.5 K/uL   RBC 4.66 4.22 - 5.81 MIL/uL   Hemoglobin 13.8 13.0 - 17.0 g/dL   HCT 43.4 39.0 - 52.0 %   MCV 93.1 80.0 - 100.0 fL   MCH 29.6 26.0 - 34.0 pg   MCHC 31.8 30.0 - 36.0 g/dL   RDW 13.3 11.5 - 15.5 %   Platelets 160 150 - 400 K/uL   nRBC 0.0 0.0 - 0.2 %   Neutrophils Relative % 81 %   Neutro Abs 11.9 (H) 1.7 - 7.7 K/uL   Band Neutrophils 1 %   Lymphocytes Relative 11 %   Lymphs Abs 1.6 0.7 - 4.0 K/uL   Monocytes Relative 7 %   Monocytes Absolute 1.0 0.1 - 1.0 K/uL   Eosinophils Relative 0 %   Eosinophils Absolute 0.0 0.0 - 0.5 K/uL   Basophils Relative 0 %   Basophils Absolute 0.0 0.0 - 0.1 K/uL   nRBC 0 0 /100 WBC   Abs Immature Granulocytes 0.00 0.00 - 0.07 K/uL   Tear Drop Cells PRESENT    Burr Cells PRESENT     Comment: Performed at [facility], [place] [address], [place], [place] [address]  I-Stat CG4 Lactic Acid, ED     Status: None   Collection Time: 01/11/18 11:55 AM  Result Value Ref Range   Lactic Acid, Venous 1.84 0.5 - 1.9 mmol/L  Culture, blood (routine x 2)     Status: None (Preliminary result)   Collection Time: 01/11/18 12:01 PM  Result Value Ref Range   Specimen Description BLOOD RIGHT HAND    Special Requests      BOTTLES DRAWN AEROBIC AND ANAEROBIC Blood Culture adequate volume   Culture      NO GROWTH < 24 HOURS Performed at [facility], [place] [address] [place],  [address]    Report Status PENDING   Culture, blood (routine x 2)     Status: None (Preliminary result)   Collection Time: 01/11/18 12:07 PM  Result Value Ref Range   Specimen Description BLOOD BLOOD RIGHT HAND    Special Requests      BOTTLES DRAWN AEROBIC  AND ANAEROBIC Blood  Culture adequate volume   Culture      NO GROWTH < 24 HOURS Performed at [facility] [address], [place], [place] [address]    Report Status PENDING   Troponin I - Now Then Q6H     Status: Abnormal   Collection Time: 01/11/18  4:46 PM  Result Value Ref Range   Troponin I 0.23 (HH) <0.03 ng/mL    Comment: CRITICAL VALUE NOTED.  VALUE IS CONSISTENT WITH PREVIOUSLY REPORTED AND CALLED VALUE. Performed at [facility], [place] [address], [place], [place] [address]   MRSA PCR Screening     Status: None   Collection Time: 01/11/18  5:21 PM  Result Value Ref Range   MRSA by PCR NEGATIVE NEGATIVE    Comment:        The GeneXpert MRSA Assay (FDA approved for NASAL specimens only), is one component of a comprehensive MRSA colonization surveillance program. It is not intended to diagnose MRSA infection nor to guide or monitor treatment for MRSA infections. Performed at [facility], [place] [address] [place], [place] [address]   Troponin I - Now Then Q6H     Status: Abnormal   Collection Time: 01/11/18  8:43 PM  Result Value Ref Range   Troponin I 0.23 (HH) <0.03 ng/mL    Comment: CRITICAL VALUE NOTED.  VALUE IS CONSISTENT WITH PREVIOUSLY REPORTED AND CALLED VALUE. Performed at [facility], [place] [address], [place], [place] [address]   Blood gas, arterial     Status: Abnormal   Collection Time: 01/11/18 11:23 PM  Result Value Ref Range   FIO2 60.00    Delivery systems BILEVEL POSITIVE AIRWAY PRESSURE    LHR 22 resp/min   Inspiratory PAP 14    Expiratory PAP 8    pH, Arterial 7.397 7.350 - 7.450   pCO2 arterial 48.0 32.0 - 48.0 mmHg   pO2, Arterial 72.3 (L) 83.0 - 108.0 mmHg   Bicarbonate 28.9 (H) 20.0 - 28.0 mmol/L   Acid-Base Excess 4.3 (H) 0.0 - 2.0 mmol/L   O2 Saturation 94.8 %   Patient temperature 98.6    Collection site RIGHT RADIAL    Drawn by [phone]    Sample type ARTERIAL DRAW    Allens test (pass/fail) PASS PASS    Troponin I - Now Then Q6H     Status: Abnormal   Collection Time: 01/12/18  2:49 AM  Result Value Ref Range   Troponin I 0.18 (HH) <0.03 ng/mL    Comment: CRITICAL VALUE NOTED.  VALUE IS CONSISTENT WITH PREVIOUSLY REPORTED AND CALLED VALUE. Performed at [facility], [place] [address] [place], [place] [address]   Basic metabolic panel     Status: Abnormal   Collection Time: 01/12/18  2:49 AM  Result Value Ref Range   Sodium 143 135 - 145 mmol/L   Potassium 4.5 3.5 - 5.1 mmol/L   Chloride 105 98 - 111 mmol/L   CO2 26 22 - 32 mmol/L   Glucose, Bld 176 (H) 70 - 99 mg/dL   BUN 33 (H) 8 - 23 mg/dL   Creatinine, Ser 1.42 (H) 0.61 - 1.24 mg/dL   Calcium 9.1 8.9 - 10.3 mg/dL   GFR calc non Af Amer 46 (L) >60 mL/min   GFR calc Af Amer 53 (L) >60 mL/min   Anion gap 12 5 - 15    Comment: Performed at [facility] [address] [place], [place]  [address]  CBC     Status: Abnormal   Collection Time: 01/12/18  2:49 AM  Result Value Ref Range   WBC 14.2 (H) 4.0 - 10.5 K/uL   RBC 4.44 4.22 - 5.81 MIL/uL   Hemoglobin 13.0 13.0 - 17.0 g/dL   HCT 40.8 39.0 - 52.0 %   MCV 91.9 80.0 - 100.0 fL   MCH 29.3 26.0 - 34.0 pg   MCHC 31.9 30.0 - 36.0 g/dL   RDW 13.4 11.5 - 15.5 %   Platelets 145 (L) 150 - 400 K/uL   nRBC 0.0 0.0 - 0.2 %    Comment: Performed at [facility], [place] [address], [place], [place] [address]    ECG   NSR at 73, RBBB - Personally Reviewed  Telemetry   Sinus rhythm - Personally Reviewed  Radiology    Dg Chest Portable 1 View  Result Date: 01/11/2018 CLINICAL DATA:  84 year old male with a history of shortness of breath and chest pain EXAM: PORTABLE CHEST 1 VIEW COMPARISON:  07/04/2017 FINDINGS: Cardiomediastinal silhouette unchanged in size and contour. Surgical changes of median sternotomy and CABG. New reticulonodular opacity of the left lung, predominantly mid lung and lower lung partially obscuring the left hemidiaphragm in the left heart border.  No pneumothorax. Lesser degree of reticular opacity at the right lung base. No displaced fracture IMPRESSION: Multifocal pneumonia, greater on the left. Surgical changes of median sternotomy and CABG. Electronically Signed   By: Corrie Mckusick D.O.   On: 01/11/2018 10:33    Cardiac Studies   Pending echo  Assessment   Principal Problem:   Multifocal pneumonia Active Problems:   S/P CABG x 2   Demand ischemia (HCC)   Acute on chronic combined systolic and diastolic CHF (congestive heart failure) ([place])   Plan   1. Echo is reassuring - looks better than prior study in 2018. Flat elevated troponin, likely demand ischemia or related to pneumonia. Work-up ongoing, on antibiotics - will add influenza panel. Still hypoxic with scant hemoptysis. Minimal recorded diuresis overnight. Restart home lasix today.  Time Spent Directly with Patient:  I have spent a total of 25 minutes with the patient reviewing hospital notes, telemetry, EKGs, labs and examining the patient as well as establishing an assessment and plan that was discussed personally with the patient.  > 50% of time was spent in direct patient care.  Length of Stay:  LOS: 1 day   Pixie Casino, MD, [facility], [place] Director of the [facility] Lipid [facility]  Card[facility]ovascular [facility] Diplomate of the American Board of Clinical Lipidology Attending Cardiologist  Direct Dial: [phone]  Fax: [phone]  Website:  [email or web address] Osgood Harl Wiechmann 01/12/2018, 10:26 AM

## 2018-01-12 NOTE — Progress Notes (Signed)
  Echocardiogram 2D Echocardiogram has been performed.  Joseph Berg 01/12/2018, 10:13 AM

## 2018-01-12 NOTE — Progress Notes (Signed)
Patient would like RCP to check if sleeping for 0200 tx he'd like to not be disturbed if resting.

## 2018-01-12 NOTE — Progress Notes (Signed)
Patient would like to stay off the NIV at this time, no distress noted. Patient agrees to use NIV if WOB were to increase, no distress noted at this time.

## 2018-01-12 NOTE — Progress Notes (Signed)
[facility] Progress Note  Patient: Joseph Berg IPJ:[id number]   PCP: [name], MD DOB: [birth year]   DOA: 01/11/2018   DOS: 01/12/2018   Date of Service: the patient was seen and examined on 01/12/2018  Brief hospital course: Pt. with PMH of CAD S/P CABG, chronic systolic CHF, HTN, HLD, OSA on CPAP; admitted on 01/11/2018, presented with complaint of shortness of breath, was found to have multifocal pneumonia, possible acute on chronic CHF. Currently further plan is continue current management.  Subjective: Feeling better but still severely hypoxic, 88% on 10 L of oxygen.  No chest pain no abdominal pain no nausea no vomiting.  Cough still present.  Headache is also reported.  Assessment and Plan: 1.  Acute hypoxic respiratory failure.  Multifocal community-acquired pneumonia COPD exacerbation Needing BiPAP overnight. Still on 10 L of oxygen. Continue nebulizer, continue steroids.  MRSA PCR negative.  Switching to IV cefepime and oral azithromycin. Continue Mucinex. Add incentive spirometry. Follow-up on cultures.  2.  Elevated troponin. Demand ischemia. CAD CABG Chronic systolic CHF. Cardiology was consulted, appreciate their input. Repeat echocardiogram currently pending. Continue oral Lasix. Continue losartan. Monitor.  3.  OSA. Currently on BiPAP, will transition to CPAP tomorrow if stable.  4.  Acute kidney injury. Likely cardiorenal hemodynamics. We will monitor.  Diet: Full code DVT Prophylaxis: subcutaneous Heparin  Advance goals of care discussion: full code  Family Communication: family was present at bedside, at the time of interview. The pt provided permission to discuss medical plan with the family. Opportunity was given to ask question and all questions were answered satisfactorily.   Disposition:  Discharge to home.  Consultants: cardiology  Procedures: Echocardiogram   Scheduled Meds: . aspirin  81 mg Oral Daily  .  azithromycin  500 mg Oral Daily  . carvedilol  12.5 mg Oral BID WC  . cilostazol  100 mg Oral BID  . enoxaparin (LOVENOX) injection  40 mg Subcutaneous Q24H  . finasteride  5 mg Oral Daily  . furosemide  40 mg Oral Daily  . guaiFENesin  600 mg Oral BID  . ipratropium-albuterol  3 mL Nebulization Q6H  . losartan  50 mg Oral Daily  . methylPREDNISolone (SOLU-MEDROL) injection  60 mg Intravenous Q8H  . rosuvastatin  40 mg Oral q1800  . sodium chloride flush  3 mL Intravenous Q12H   Continuous Infusions: . sodium chloride    . ceFEPime (MAXIPIME) IV Stopped (01/12/18 0700)   PRN Meds: sodium chloride, acetaminophen, albuterol, HYDROcodone-acetaminophen, HYDROcodone-homatropine, ondansetron **OR** ondansetron (ZOFRAN) IV, sodium chloride flush Antibiotics: Anti-infectives (From admission, onward)   Start     Dose/Rate Route Frequency Ordered Stop   01/12/18 1200  azithromycin (ZITHROMAX) tablet 500 mg     500 mg Oral Daily 01/12/18 1128     01/11/18 2000  ceFEPIme (MAXIPIME) 2 g in sodium chloride 0.9 % 100 mL IVPB     2 g 200 mL/hr over 30 Minutes Intravenous Every 24 hours 01/11/18 1433     01/11/18 1500  vancomycin (VANCOCIN) 1,250 mg in sodium chloride 0.9 % 250 mL IVPB  Status:  Discontinued     1,250 mg 166.7 mL/hr over 90 Minutes Intravenous Every 36 hours 01/11/18 1433 01/12/18 0745   01/11/18 1045  cefTRIAXone (ROCEPHIN) 1 g in sodium chloride 0.9 % 100 mL IVPB     1 g 200 mL/hr over 30 Minutes Intravenous  Once 01/11/18 1042 01/11/18 1246       Objective: Physical Exam: Vitals:  01/12/18 0927 01/12/18 1205 01/12/18 1345 01/12/18 1657  BP:  108/60  107/73  Pulse:  78 82 78  Resp:  19 18 15   Temp:  97.8 F (36.6 C)  98.2 F (36.8 C)  TempSrc:  Oral  Oral  SpO2:  (!) 87% 90% 91%  Weight: 103.6 kg     Height:        Intake/Output Summary (Last 24 hours) at 01/12/2018 1855 Last data filed at 01/12/2018 0600 Gross per 24 hour  Intake 241.38 ml  Output 300 ml  Net  -58.62 ml   Filed Weights   01/11/18 1645 01/12/18 0927  Weight: 105.2 kg 103.6 kg   General: Alert, Awake and Oriented to Time, Place and Person. Appear in marked distress, affect appropriate Eyes: PERRL, Conjunctiva normal ENT: Oral Mucosa clear moist. Neck: difficult to assess  JVD, no Abnormal Mass Or lumps Cardiovascular: S1 and S2 Present, no Murmur, Peripheral Pulses Present Respiratory: increased respiratory effort, Bilateral Air entry equal and Decreased, no use of accessory muscle, bilateral Crackles, no wheezes Abdomen: Bowel Sound present, Soft and no tenderness, no hernia Skin: no redness, no Rash, non induration Extremities: on Pedal edema, on calf tenderness Neurologic: Grossly no focal neuro deficit. Bilaterally Equal motor strength  Data Reviewed: CBC: Recent Labs  Lab 01/11/18 0935 01/12/18 0249  WBC 14.5* 14.2*  NEUTROABS 11.9*  --   HGB 13.8 13.0  HCT 43.4 40.8  MCV 93.1 91.9  PLT 160 342*   Basic Metabolic Panel: Recent Labs  Lab 01/11/18 0935 01/12/18 0249  NA 141 143  K 4.3 4.5  CL 102 105  CO2 28 26  GLUCOSE 147* 176*  BUN 23 33*  CREATININE 1.57* 1.42*  CALCIUM 9.1 9.1    Liver Function Tests: Recent Labs  Lab 01/11/18 0935  AST 22  ALT 14  ALKPHOS 39  BILITOT 1.0  PROT 5.6*  ALBUMIN 3.1*   No results for input(s): LIPASE, AMYLASE in the last 168 hours. No results for input(s): AMMONIA in the last 168 hours. Coagulation Profile: No results for input(s): INR, PROTIME in the last 168 hours. Cardiac Enzymes: Recent Labs  Lab 01/11/18 0935 01/11/18 1646 01/11/18 2043 01/12/18 0249  TROPONINI 0.19* 0.23* 0.23* 0.18*   BNP (last 3 results) No results for input(s): PROBNP in the last 8760 hours. CBG: No results for input(s): GLUCAP in the last 168 hours. Studies: Dg Chest Port 1 View  Result Date: 01/12/2018 CLINICAL DATA:  Severe shortness of breath for 4 days. EXAM: PORTABLE CHEST 1 VIEW COMPARISON:  01/11/2018 FINDINGS:  Postsurgical changes from CABG. Enlarged cardiac silhouette. Calcific atherosclerotic disease of the aorta. Mediastinal contours appear intact. There is no evidence of pneumothorax. Probable small left pleural effusion. Bilateral lower thorax reticulonodular airspace consolidation, greater on the left. Osseous structures are without acute abnormality. Soft tissues are grossly normal. IMPRESSION: Multifocal pneumonia worse in the left lower hemithorax. Probable small left pleural effusion. Electronically Signed   By: Fidela Salisbury M.D.   On: 01/12/2018 11:22     Time spent: 35 minutes  Author: Berle Mull, MD [facility] Pager: [phone] 01/12/2018 6:55 PM  Between 7PM-7AM, please contact night-coverage at [email or web address], password [facility]

## 2018-01-13 LAB — CBC WITH DIFFERENTIAL/PLATELET
Abs Immature Granulocytes: 0.12 10*3/uL — ABNORMAL HIGH (ref 0.00–0.07)
BASOS ABS: 0 10*3/uL (ref 0.0–0.1)
Basophils Relative: 0 %
Eosinophils Absolute: 0 10*3/uL (ref 0.0–0.5)
Eosinophils Relative: 0 %
HCT: 40.9 % (ref 39.0–52.0)
Hemoglobin: 12.7 g/dL — ABNORMAL LOW (ref 13.0–17.0)
Immature Granulocytes: 1 %
Lymphocytes Relative: 7 %
Lymphs Abs: 1.1 10*3/uL (ref 0.7–4.0)
MCH: 28.5 pg (ref 26.0–34.0)
MCHC: 31.1 g/dL (ref 30.0–36.0)
MCV: 91.9 fL (ref 80.0–100.0)
MONOS PCT: 3 %
Monocytes Absolute: 0.5 10*3/uL (ref 0.1–1.0)
Neutro Abs: 15 10*3/uL — ABNORMAL HIGH (ref 1.7–7.7)
Neutrophils Relative %: 89 %
Platelets: 201 10*3/uL (ref 150–400)
RBC: 4.45 MIL/uL (ref 4.22–5.81)
RDW: 13.2 % (ref 11.5–15.5)
WBC: 16.7 10*3/uL — ABNORMAL HIGH (ref 4.0–10.5)
nRBC: 0 % (ref 0.0–0.2)

## 2018-01-13 LAB — COMPREHENSIVE METABOLIC PANEL
ALBUMIN: 2.7 g/dL — AB (ref 3.5–5.0)
ALT: 25 U/L (ref 0–44)
AST: 46 U/L — AB (ref 15–41)
Alkaline Phosphatase: 45 U/L (ref 38–126)
Anion gap: 10 (ref 5–15)
BUN: 56 mg/dL — ABNORMAL HIGH (ref 8–23)
CO2: 26 mmol/L (ref 22–32)
Calcium: 9 mg/dL (ref 8.9–10.3)
Chloride: 100 mmol/L (ref 98–111)
Creatinine, Ser: 1.36 mg/dL — ABNORMAL HIGH (ref 0.61–1.24)
GFR calc Af Amer: 56 mL/min — ABNORMAL LOW (ref >60)
GFR calc non Af Amer: 48 mL/min — ABNORMAL LOW (ref >60)
Glucose, Bld: 181 mg/dL — ABNORMAL HIGH (ref 70–99)
POTASSIUM: 5.2 mmol/L — AB (ref 3.5–5.1)
Sodium: 136 mmol/L (ref 135–145)
Total Bilirubin: 2 mg/dL — ABNORMAL HIGH (ref 0.3–1.2)
Total Protein: 5.4 g/dL — ABNORMAL LOW (ref 6.5–8.1)

## 2018-01-13 LAB — EXPECTORATED SPUTUM ASSESSMENT W GRAM STAIN, RFLX TO RESP C

## 2018-01-13 LAB — EXPECTORATED SPUTUM ASSESSMENT W REFEX TO RESP CULTURE

## 2018-01-13 LAB — STREP PNEUMONIAE URINARY ANTIGEN: Strep Pneumo Urinary Antigen: NEGATIVE

## 2018-01-13 MED ORDER — BENZONATATE 100 MG PO CAPS
100.0000 mg | ORAL_CAPSULE | Freq: Three times a day (TID) | ORAL | Status: DC
  Administered 2018-01-13 – 2018-01-18 (×16): 100 mg via ORAL
  Filled 2018-01-13 (×16): qty 1

## 2018-01-13 MED ORDER — METHYLPREDNISOLONE SODIUM SUCC 125 MG IJ SOLR
60.0000 mg | Freq: Four times a day (QID) | INTRAMUSCULAR | Status: DC
  Administered 2018-01-13 – 2018-01-16 (×12): 60 mg via INTRAVENOUS
  Filled 2018-01-13 (×12): qty 2

## 2018-01-13 MED ORDER — SODIUM CHLORIDE 0.9 % IV SOLN
1.0000 g | INTRAVENOUS | Status: DC
  Administered 2018-01-13 – 2018-01-14 (×2): 1 g via INTRAVENOUS
  Filled 2018-01-13 (×3): qty 10

## 2018-01-13 MED ORDER — DM-GUAIFENESIN ER 30-600 MG PO TB12
1.0000 | ORAL_TABLET | Freq: Two times a day (BID) | ORAL | Status: DC
  Administered 2018-01-13 – 2018-01-15 (×6): 1 via ORAL
  Filled 2018-01-13 (×6): qty 1

## 2018-01-13 NOTE — Progress Notes (Signed)
[facility] Progress Note  Patient: Joseph Berg:[id number]   PCP: [name], MD DOB: December 14, 1933   DOA: 01/11/2018   DOS: 01/13/2018   Date of Service: the patient was seen and examined on 01/13/2018  Brief hospital course: Pt. with PMH of CAD S/P CABG, chronic systolic CHF, HTN, HLD, OSA on CPAP; admitted on 01/11/2018, presented with complaint of shortness of breath, was found to have multifocal pneumonia, possible acute on chronic CHF. Currently further plan is continue current management.  Subjective: still has cough and hypoxia, shortnes of breath and tahcypneic   Assessment and Plan: 1.  Acute hypoxic respiratory failure.  Multifocal community-acquired pneumonia COPD exacerbation Needing BiPAP overnight. Still on 10 L of oxygen. Continue nebulizer, continue steroids.  MRSA PCR negative.  Switching to IV cefepime and oral azithromycin. Switch to ceftriaxone Continue Mucinex. Add incentive spirometry. Follow-up on cultures.  2.  Elevated troponin. Demand ischemia. CAD CABG Chronic systolic CHF. Cardiology was consulted, appreciate their input. Repeat echocardiogram currently pending. Hold oral Lasix and losartan. Continue coreg/ d/w cardiology  Monitor.  3.  OSA. Currently on BiPAP, will transition to CPAP tomorrow if stable.  4.  Acute kidney injury. Likely cardiorenal hemodynamics. We will monitor. Hold losartan due to hyperkalemia   Diet: Full code DVT Prophylaxis: subcutaneous Heparin  Advance goals of care discussion: full code  Family Communication: family was present at bedside, at the time of interview. The pt provided permission to discuss medical plan with the family. Opportunity was given to ask question and all questions were answered satisfactorily.   Disposition:  Discharge to home.  Consultants: cardiology  Procedures: Echocardiogram   Scheduled Meds: . aspirin  81 mg Oral Daily  . azithromycin  500 mg Oral Daily  .  benzonatate  100 mg Oral TID  . carvedilol  12.5 mg Oral BID WC  . cilostazol  100 mg Oral BID  . dextromethorphan-guaiFENesin  1 tablet Oral BID  . enoxaparin (LOVENOX) injection  40 mg Subcutaneous Q24H  . finasteride  5 mg Oral Daily  . ipratropium-albuterol  3 mL Nebulization Q6H  . methylPREDNISolone (SOLU-MEDROL) injection  60 mg Intravenous Q6H  . rosuvastatin  40 mg Oral q1800  . sodium chloride flush  3 mL Intravenous Q12H   Continuous Infusions: . sodium chloride    . cefTRIAXone (ROCEPHIN)  IV 1 g (01/13/18 1233)   PRN Meds: sodium chloride, acetaminophen, albuterol, HYDROcodone-acetaminophen, HYDROcodone-homatropine, ondansetron **OR** ondansetron (ZOFRAN) IV, sodium chloride flush Antibiotics: Anti-infectives (From admission, onward)   Start     Dose/Rate Route Frequency Ordered Stop   01/13/18 1115  cefTRIAXone (ROCEPHIN) 1 g in sodium chloride 0.9 % 100 mL IVPB     1 g 200 mL/hr over 30 Minutes Intravenous Every 24 hours 01/13/18 1104     01/12/18 1200  azithromycin (ZITHROMAX) tablet 500 mg     500 mg Oral Daily 01/12/18 1128     01/11/18 2000  ceFEPIme (MAXIPIME) 2 g in sodium chloride 0.9 % 100 mL IVPB  Status:  Discontinued     2 g 200 mL/hr over 30 Minutes Intravenous Every 24 hours 01/11/18 1433 01/13/18 1104   01/11/18 1500  vancomycin (VANCOCIN) 1,250 mg in sodium chloride 0.9 % 250 mL IVPB  Status:  Discontinued     1,250 mg 166.7 mL/hr over 90 Minutes Intravenous Every 36 hours 01/11/18 1433 01/12/18 0745   01/11/18 1045  cefTRIAXone (ROCEPHIN) 1 g in sodium chloride 0.9 % 100 mL IVPB  1 g 200 mL/hr over 30 Minutes Intravenous  Once 01/11/18 1042 01/11/18 1246       Objective: Physical Exam: Vitals:   01/13/18 0833 01/13/18 0853 01/13/18 1522 01/13/18 1716  BP: (!) 100/59   94/65  Pulse: 79   70  Resp: 19   (!) 26  Temp: 98.1 F (36.7 C)   (!) 97.3 F (36.3 C)  TempSrc: Axillary   Oral  SpO2: 92% 92% 93% 92%  Weight:      Height:         Intake/Output Summary (Last 24 hours) at 01/13/2018 1724 Last data filed at 01/13/2018 1700 Gross per 24 hour  Intake 100 ml  Output 425 ml  Net -325 ml   Filed Weights   01/11/18 1645 01/12/18 0927 01/13/18 0656  Weight: 105.2 kg 103.6 kg 101.2 kg   General: Alert, Awake and Oriented to Time, Place and Person. Appear in marked distress, affect appropriate Eyes: PERRL, Conjunctiva normal ENT: Oral Mucosa clear moist. Neck: difficult to assess  JVD, no Abnormal Mass Or lumps Cardiovascular: S1 and S2 Present, no Murmur, Peripheral Pulses Present Respiratory: increased respiratory effort, Bilateral Air entry equal and Decreased, no use of accessory muscle, bilateral Crackles, no wheezes Abdomen: Bowel Sound present, Soft and no tenderness, no hernia Skin: no redness, no Rash, non induration Extremities: on Pedal edema, on calf tenderness Neurologic: Grossly no focal neuro deficit. Bilaterally Equal motor strength  Data Reviewed: CBC: Recent Labs  Lab 01/11/18 0935 01/12/18 0249 01/13/18 0858  WBC 14.5* 14.2* 16.7*  NEUTROABS 11.9*  --  15.0*  HGB 13.8 13.0 12.7*  HCT 43.4 40.8 40.9  MCV 93.1 91.9 91.9  PLT 160 145* 111   Basic Metabolic Panel: Recent Labs  Lab 01/11/18 0935 01/12/18 0249 01/13/18 0858  NA 141 143 136  K 4.3 4.5 5.2*  CL 102 105 100  CO2 28 26 26   GLUCOSE 147* 176* 181*  BUN 23 33* 56*  CREATININE 1.57* 1.42* 1.36*  CALCIUM 9.1 9.1 9.0    Liver Function Tests: Recent Labs  Lab 01/11/18 0935 01/13/18 0858  AST 22 46*  ALT 14 25  ALKPHOS 39 45  BILITOT 1.0 2.0*  PROT 5.6* 5.4*  ALBUMIN 3.1* 2.7*   No results for input(s): LIPASE, AMYLASE in the last 168 hours. No results for input(s): AMMONIA in the last 168 hours. Coagulation Profile: No results for input(s): INR, PROTIME in the last 168 hours. Cardiac Enzymes: Recent Labs  Lab 01/11/18 0935 01/11/18 1646 01/11/18 2043 01/12/18 0249  TROPONINI 0.19* 0.23* 0.23* 0.18*   BNP  (last 3 results) No results for input(s): PROBNP in the last 8760 hours. CBG: No results for input(s): GLUCAP in the last 168 hours. Studies: No results found.   Time spent: 35 minutes  Author: Berle Mull, MD [facility] Pager: [phone] 01/13/2018 5:24 PM  Between 7PM-7AM, please contact night-coverage at [email or web address], password [facility]

## 2018-01-13 NOTE — Progress Notes (Signed)
Progress Note  Patient Name: Joseph Berg Date of Encounter: 01/13/2018  Primary Cardiologist: Kirk Ruths, MD   Subjective   Feeling better.  Still coughing and short of breath.   Inpatient Medications    Scheduled Meds: . aspirin  81 mg Oral Daily  . azithromycin  500 mg Oral Daily  . benzonatate  100 mg Oral TID  . carvedilol  12.5 mg Oral BID WC  . cilostazol  100 mg Oral BID  . dextromethorphan-guaiFENesin  1 tablet Oral BID  . enoxaparin (LOVENOX) injection  40 mg Subcutaneous Q24H  . finasteride  5 mg Oral Daily  . furosemide  40 mg Oral Daily  . ipratropium-albuterol  3 mL Nebulization Q6H  . losartan  50 mg Oral Daily  . methylPREDNISolone (SOLU-MEDROL) injection  60 mg Intravenous Q6H  . rosuvastatin  40 mg Oral q1800  . sodium chloride flush  3 mL Intravenous Q12H   Continuous Infusions: . sodium chloride    . ceFEPime (MAXIPIME) IV Stopped (01/12/18 2128)   PRN Meds: sodium chloride, acetaminophen, albuterol, HYDROcodone-acetaminophen, HYDROcodone-homatropine, ondansetron **OR** ondansetron (ZOFRAN) IV, sodium chloride flush   Vital Signs    Vitals:   01/13/18 0422 01/13/18 0656 01/13/18 0833 01/13/18 0853  BP: (!) 104/53  (!) 100/59   Pulse: 71  79   Resp: (!) 24  19   Temp: 97.7 F (36.5 C)  98.1 F (36.7 C)   TempSrc: Oral  Axillary   SpO2: 93%  92% 92%  Weight:  101.2 kg    Height:        Intake/Output Summary (Last 24 hours) at 01/13/2018 1011 Last data filed at 01/12/2018 2300 Gross per 24 hour  Intake 100 ml  Output -  Net 100 ml   Filed Weights   01/11/18 1645 01/12/18 0927 01/13/18 0656  Weight: 105.2 kg 103.6 kg 101.2 kg    Telemetry    Sinus rhythm.  PVCs - Personally Reviewed  ECG    n/a - Personally Reviewed  Physical Exam   VS:  BP (!) 100/59 (BP Location: Right Arm)   Pulse 79   Temp 98.1 F (36.7 C) (Axillary)   Resp 19   Ht 5\' 9"  (1.753 m)   Wt 101.2 kg   SpO2 92%   BMI 32.95 kg/m  , BMI Body  mass index is 32.95 kg/m. GENERAL:  Ill-appearing HEENT: Pupils equal round and reactive, fundi not visualized, oral mucosa unremarkable NECK:  No jugular venous distention, waveform within normal limits, carotid upstroke brisk and symmetric, no bruits LUNGS:  Clear to auscultation bilaterally HEART:  RRR.  PMI not displaced or sustained,S1 and S2 within normal limits, no S3, no S4, no clicks, no rubs, no murmurs ABD:  Flat, positive bowel sounds normal in frequency in pitch, no bruits, no rebound, no guarding, no midline pulsatile mass, no hepatomegaly, no splenomegaly EXT:  2 plus pulses throughout, no edema, no cyanosis no clubbing SKIN:  No rashes no nodules NEURO:  Cranial nerves II through XII grossly intact, motor grossly intact throughout PSYCH:  Cognitively intact, oriented to person place and time   Labs    Chemistry Recent Labs  Lab 01/11/18 0935 01/12/18 0249  NA 141 143  K 4.3 4.5  CL 102 105  CO2 28 26  GLUCOSE 147* 176*  BUN 23 33*  CREATININE 1.57* 1.42*  CALCIUM 9.1 9.1  PROT 5.6*  --   ALBUMIN 3.1*  --   AST 22  --  ALT 14  --   ALKPHOS 39  --   BILITOT 1.0  --   GFRNONAA 40* 46*  GFRAA 47* 53*  ANIONGAP 11 12     Hematology Recent Labs  Lab 01/11/18 0935 01/12/18 0249 01/13/18 0858  WBC 14.5* 14.2* 16.7*  RBC 4.66 4.44 4.45  HGB 13.8 13.0 12.7*  HCT 43.4 40.8 40.9  MCV 93.1 91.9 91.9  MCH 29.6 29.3 28.5  MCHC 31.8 31.9 31.1  RDW 13.3 13.4 13.2  PLT 160 145* 201    Cardiac Enzymes Recent Labs  Lab 01/11/18 0935 01/11/18 1646 01/11/18 2043 01/12/18 0249  TROPONINI 0.19* 0.23* 0.23* 0.18*   No results for input(s): TROPIPOC in the last 168 hours.   BNP Recent Labs  Lab 01/11/18 0935  BNP 296.6*     DDimer No results for input(s): DDIMER in the last 168 hours.   Radiology    Dg Chest Port 1 View  Result Date: 01/12/2018 CLINICAL DATA:  Severe shortness of breath for 4 days. EXAM: PORTABLE CHEST 1 VIEW COMPARISON:   01/11/2018 FINDINGS: Postsurgical changes from CABG. Enlarged cardiac silhouette. Calcific atherosclerotic disease of the aorta. Mediastinal contours appear intact. There is no evidence of pneumothorax. Probable small left pleural effusion. Bilateral lower thorax reticulonodular airspace consolidation, greater on the left. Osseous structures are without acute abnormality. Soft tissues are grossly normal. IMPRESSION: Multifocal pneumonia worse in the left lower hemithorax. Probable small left pleural effusion. Electronically Signed   By: Fidela Salisbury M.D.   On: 01/12/2018 11:22    Cardiac Studies   Echo 01/12/18: Study Conclusions  - Left ventricle: The cavity size was normal. Wall thickness was   increased in a pattern of mild LVH. Systolic function was normal.   The estimated ejection fraction was in the range of 50% to 55%.   Left ventricular diastolic function parameters were normal.  Patient Profile     84 y.o. male with CAD s/p MI and CABG x2 (1994, 1610), chronic systolic and diastolic heart failure, hypertension, hyperlipidemia, OSA on CPAP here with CAP.  Assessment & Plan    # Demand ischemia: # CAD s/p CABG: Troponin mildly elevated to 0.23.  Echo with improvement in LVEF.  No plans for further work up at this time. Continue aspirin, clopidogrel, and rosuvastatin.  Home carvedilol is on hold.   # CAP: Per primary team.  He is on Azithromycin and Cefepime.   # Hypertension:  Continue losartan. Holding carvedilol as above.     # PAD:  # Moderate carotid stenosis: Moderate disease on R.  Continue aspirin, statin and cilostizol.   # Abdominal aorta aneurysm: 4.1cm 07/2016.   [facility] will sign off.   Medication Recommendations:  Resume carvedilol when BP and respiratory status improve Other recommendations (labs, testing, etc):  n/a Follow up as an outpatient:  As scheduled  For questions or updates, please contact [facility] Please consult [email or web address]  for contact info under        Signed, Skeet Latch, MD  01/13/2018, 10:11 AM

## 2018-01-14 LAB — CBC
HCT: 42.5 % (ref 39.0–52.0)
Hemoglobin: 13.5 g/dL (ref 13.0–17.0)
MCH: 29 pg (ref 26.0–34.0)
MCHC: 31.8 g/dL (ref 30.0–36.0)
MCV: 91.2 fL (ref 80.0–100.0)
PLATELETS: 199 10*3/uL (ref 150–400)
RBC: 4.66 MIL/uL (ref 4.22–5.81)
RDW: 13 % (ref 11.5–15.5)
WBC: 17.3 10*3/uL — ABNORMAL HIGH (ref 4.0–10.5)
nRBC: 0 % (ref 0.0–0.2)

## 2018-01-14 LAB — BASIC METABOLIC PANEL
Anion gap: 12 (ref 5–15)
BUN: 57 mg/dL — AB (ref 8–23)
CO2: 26 mmol/L (ref 22–32)
CREATININE: 1.42 mg/dL — AB (ref 0.61–1.24)
Calcium: 9 mg/dL (ref 8.9–10.3)
Chloride: 99 mmol/L (ref 98–111)
GFR calc Af Amer: 53 mL/min — ABNORMAL LOW (ref >60)
GFR calc non Af Amer: 46 mL/min — ABNORMAL LOW (ref >60)
Glucose, Bld: 204 mg/dL — ABNORMAL HIGH (ref 70–99)
Potassium: 4.3 mmol/L (ref 3.5–5.1)
Sodium: 137 mmol/L (ref 135–145)

## 2018-01-14 MED ORDER — HYDROCOD POLST-CPM POLST ER 10-8 MG/5ML PO SUER
5.0000 mL | Freq: Two times a day (BID) | ORAL | Status: DC
  Administered 2018-01-14 – 2018-01-18 (×8): 5 mL via ORAL
  Filled 2018-01-14 (×8): qty 5

## 2018-01-14 MED ORDER — SENNOSIDES-DOCUSATE SODIUM 8.6-50 MG PO TABS
1.0000 | ORAL_TABLET | Freq: Two times a day (BID) | ORAL | Status: DC
  Administered 2018-01-14 – 2018-01-15 (×2): 1 via ORAL
  Filled 2018-01-14 (×7): qty 1

## 2018-01-14 NOTE — Progress Notes (Signed)
[facility] Progress Note  Patient: Joseph Berg IWP:[id number]   PCP: [name], MD DOB: [birth year]   DOA: 01/11/2018   DOS: 01/14/2018   Date of Service: the patient was seen and examined on 01/14/2018  Brief hospital course: Pt. with PMH of CAD S/P CABG, chronic systolic CHF, HTN, HLD, OSA on CPAP; admitted on 01/11/2018, presented with complaint of shortness of breath, was found to have multifocal pneumonia, possible acute on chronic CHF. Currently further plan is continue current management.  Subjective: Cough is still present.  No nausea no vomiting.  Breathing is okay.  Still on 11 L of oxygen.  Assessment and Plan: 1.  Acute hypoxic respiratory failure.  Multifocal community-acquired pneumonia COPD exacerbation Needing BiPAP overnight. Still on 10 L of oxygen. Continue nebulizer, continue steroids.  MRSA PCR negative.  Switching to IV cefepime and oral azithromycin. Switch to ceftriaxone Continue Mucinex. Add incentive spirometry. Follow-up on cultures. We will repeat another chest x-ray tomorrow.  If that does not show anything new that will explain patient's persistent hypoxia and then we will pursue a CT chest with contrast.  2.  Elevated troponin. Demand ischemia. CAD CABG Chronic systolic CHF. Cardiology was consulted, appreciate their input. Repeat echocardiogram currently pending. Hold oral Lasix and losartan. Continue coreg/ d/w cardiology  Monitor.  3.  OSA. Currently on BiPAP, will transition to CPAP tomorrow if stable.  4.  Acute kidney injury. Likely cardiorenal hemodynamics. We will monitor. Hold losartan due to hyperkalemia   Diet: Full code DVT Prophylaxis: subcutaneous Heparin  Advance goals of care discussion: full code  Family Communication: family was present at bedside, at the time of interview. The pt provided permission to discuss medical plan with the family. Opportunity was given to ask question and all questions were  answered satisfactorily.   Disposition:  Discharge to home.  Consultants: cardiology  Procedures: Echocardiogram   Scheduled Meds: . aspirin  81 mg Oral Daily  . azithromycin  500 mg Oral Daily  . benzonatate  100 mg Oral TID  . carvedilol  12.5 mg Oral BID WC  . chlorpheniramine-HYDROcodone  5 mL Oral Q12H  . cilostazol  100 mg Oral BID  . dextromethorphan-guaiFENesin  1 tablet Oral BID  . enoxaparin (LOVENOX) injection  40 mg Subcutaneous Q24H  . finasteride  5 mg Oral Daily  . ipratropium-albuterol  3 mL Nebulization Q6H  . methylPREDNISolone (SOLU-MEDROL) injection  60 mg Intravenous Q6H  . rosuvastatin  40 mg Oral q1800  . senna-docusate  1 tablet Oral BID  . sodium chloride flush  3 mL Intravenous Q12H   Continuous Infusions: . sodium chloride    . cefTRIAXone (ROCEPHIN)  IV 1 g (01/14/18 1105)   PRN Meds: sodium chloride, acetaminophen, albuterol, HYDROcodone-acetaminophen, ondansetron **OR** ondansetron (ZOFRAN) IV, sodium chloride flush Antibiotics: Anti-infectives (From admission, onward)   Start     Dose/Rate Route Frequency Ordered Stop   01/13/18 1115  cefTRIAXone (ROCEPHIN) 1 g in sodium chloride 0.9 % 100 mL IVPB     1 g 200 mL/hr over 30 Minutes Intravenous Every 24 hours 01/13/18 1104     01/12/18 1200  azithromycin (ZITHROMAX) tablet 500 mg     500 mg Oral Daily 01/12/18 1128     01/11/18 2000  ceFEPIme (MAXIPIME) 2 g in sodium chloride 0.9 % 100 mL IVPB  Status:  Discontinued     2 g 200 mL/hr over 30 Minutes Intravenous Every 24 hours 01/11/18 1433 01/13/18 1104   01/11/18 1500  vancomycin (VANCOCIN) 1,250 mg in sodium chloride 0.9 % 250 mL IVPB  Status:  Discontinued     1,250 mg 166.7 mL/hr over 90 Minutes Intravenous Every 36 hours 01/11/18 1433 01/12/18 0745   01/11/18 1045  cefTRIAXone (ROCEPHIN) 1 g in sodium chloride 0.9 % 100 mL IVPB     1 g 200 mL/hr over 30 Minutes Intravenous  Once 01/11/18 1042 01/11/18 1246       Objective: Physical  Exam: Vitals:   01/14/18 0801 01/14/18 1300 01/14/18 1619 01/14/18 1641  BP:   136/70 128/71  Pulse:  77 76 78  Resp:  (!) 22  19  Temp:   98.2 F (36.8 C) 98.2 F (36.8 C)  TempSrc:   Oral Oral  SpO2: 91% 92% 92% 93%  Weight:      Height:        Intake/Output Summary (Last 24 hours) at 01/14/2018 1809 Last data filed at 01/14/2018 1802 Gross per 24 hour  Intake 1310 ml  Output 150 ml  Net 1160 ml   Filed Weights   01/11/18 1645 01/12/18 0927 01/13/18 0656  Weight: 105.2 kg 103.6 kg 101.2 kg   General: Alert, Awake and Oriented to Time, Place and Person. Appear in marked distress, affect appropriate Eyes: PERRL, Conjunctiva normal ENT: Oral Mucosa clear moist. Neck: difficult to assess  JVD, no Abnormal Mass Or lumps Cardiovascular: S1 and S2 Present, no Murmur, Peripheral Pulses Present Respiratory: increased respiratory effort, Bilateral Air entry equal and Decreased, no use of accessory muscle, bilateral Crackles, no wheezes Abdomen: Bowel Sound present, Soft and no tenderness, no hernia Skin: no redness, no Rash, non induration Extremities: on Pedal edema, on calf tenderness Neurologic: Grossly no focal neuro deficit. Bilaterally Equal motor strength  Data Reviewed: CBC: Recent Labs  Lab 01/11/18 0935 01/12/18 0249 01/13/18 0858 01/14/18 0915  WBC 14.5* 14.2* 16.7* 17.3*  NEUTROABS 11.9*  --  15.0*  --   HGB 13.8 13.0 12.7* 13.5  HCT 43.4 40.8 40.9 42.5  MCV 93.1 91.9 91.9 91.2  PLT 160 145* 201 656   Basic Metabolic Panel: Recent Labs  Lab 01/11/18 0935 01/12/18 0249 01/13/18 0858 01/14/18 0915  NA 141 143 136 137  K 4.3 4.5 5.2* 4.3  CL 102 105 100 99  CO2 28 26 26 26   GLUCOSE 147* 176* 181* 204*  BUN 23 33* 56* 57*  CREATININE 1.57* 1.42* 1.36* 1.42*  CALCIUM 9.1 9.1 9.0 9.0    Liver Function Tests: Recent Labs  Lab 01/11/18 0935 01/13/18 0858  AST 22 46*  ALT 14 25  ALKPHOS 39 45  BILITOT 1.0 2.0*  PROT 5.6* 5.4*  ALBUMIN 3.1* 2.7*     No results for input(s): LIPASE, AMYLASE in the last 168 hours. No results for input(s): AMMONIA in the last 168 hours. Coagulation Profile: No results for input(s): INR, PROTIME in the last 168 hours. Cardiac Enzymes: Recent Labs  Lab 01/11/18 0935 01/11/18 1646 01/11/18 2043 01/12/18 0249  TROPONINI 0.19* 0.23* 0.23* 0.18*   BNP (last 3 results) No results for input(s): PROBNP in the last 8760 hours. CBG: No results for input(s): GLUCAP in the last 168 hours. Studies: No results found.   Time spent: 35 minutes  Author: Berle Mull, MD [facility] Pager: [phone] 01/14/2018 6:09 PM  Between 7PM-7AM, please contact night-coverage at [email or web address], password [facility]

## 2018-01-15 ENCOUNTER — Inpatient Hospital Stay (HOSPITAL_COMMUNITY): Payer: Medicare PPO

## 2018-01-15 LAB — CBC
HCT: 39.4 % (ref 39.0–52.0)
Hemoglobin: 12.6 g/dL — ABNORMAL LOW (ref 13.0–17.0)
MCH: 28.7 pg (ref 26.0–34.0)
MCHC: 32 g/dL (ref 30.0–36.0)
MCV: 89.7 fL (ref 80.0–100.0)
Platelets: 180 10*3/uL (ref 150–400)
RBC: 4.39 MIL/uL (ref 4.22–5.81)
RDW: 12.7 % (ref 11.5–15.5)
WBC: 15 10*3/uL — ABNORMAL HIGH (ref 4.0–10.5)
nRBC: 0 % (ref 0.0–0.2)

## 2018-01-15 LAB — BASIC METABOLIC PANEL
Anion gap: 8 (ref 5–15)
BUN: 56 mg/dL — ABNORMAL HIGH (ref 8–23)
CO2: 29 mmol/L (ref 22–32)
Calcium: 8.7 mg/dL — ABNORMAL LOW (ref 8.9–10.3)
Chloride: 101 mmol/L (ref 98–111)
Creatinine, Ser: 1.52 mg/dL — ABNORMAL HIGH (ref 0.61–1.24)
GFR calc Af Amer: 49 mL/min — ABNORMAL LOW (ref >60)
GFR, EST NON AFRICAN AMERICAN: 42 mL/min — AB (ref >60)
Glucose, Bld: 206 mg/dL — ABNORMAL HIGH (ref 70–99)
POTASSIUM: 4.5 mmol/L (ref 3.5–5.1)
Sodium: 138 mmol/L (ref 135–145)

## 2018-01-15 LAB — CULTURE, RESPIRATORY W GRAM STAIN: Culture: NORMAL

## 2018-01-15 LAB — MAGNESIUM: Magnesium: 2.8 mg/dL — ABNORMAL HIGH (ref 1.7–2.4)

## 2018-01-15 MED ORDER — CEFDINIR 300 MG PO CAPS
300.0000 mg | ORAL_CAPSULE | Freq: Two times a day (BID) | ORAL | Status: AC
  Administered 2018-01-15 – 2018-01-17 (×6): 300 mg via ORAL
  Filled 2018-01-15 (×7): qty 1

## 2018-01-15 MED ORDER — FUROSEMIDE 40 MG PO TABS
40.0000 mg | ORAL_TABLET | Freq: Every day | ORAL | Status: DC
  Administered 2018-01-15: 40 mg via ORAL
  Filled 2018-01-15: qty 1

## 2018-01-15 MED ORDER — IPRATROPIUM-ALBUTEROL 0.5-2.5 (3) MG/3ML IN SOLN
3.0000 mL | Freq: Three times a day (TID) | RESPIRATORY_TRACT | Status: DC
  Administered 2018-01-16 – 2018-01-18 (×7): 3 mL via RESPIRATORY_TRACT
  Filled 2018-01-15 (×7): qty 3

## 2018-01-15 MED ORDER — HYDROCODONE-HOMATROPINE 5-1.5 MG/5ML PO SYRP
5.0000 mL | ORAL_SOLUTION | ORAL | Status: DC | PRN
  Administered 2018-01-16: 5 mL via ORAL
  Filled 2018-01-15: qty 5

## 2018-01-15 NOTE — Care Management Important Message (Signed)
Important Message  Patient Details  Name: Joseph Berg MRN: [id number] Date of Birth: January 27, 1934   Medicare Important Message Given:  Yes    [name] 01/15/2018, 4:10 PM

## 2018-01-15 NOTE — Evaluation (Signed)
Physical Therapy Evaluation Patient Details Name: Joseph Berg MRN: [id number] DOB: 02-20-1933 Today's Date: 01/15/2018   History of Present Illness  Joseph Berg is an 84yo male who comes to [facility] on 12/5 p 5d SOB, already started ABX adn steroids witout much change: pt admitted with PNA + COPD exacerbation. PMH: CAD s/p CABG 1994, sCHF, emphysema, OSA on CPAP. PTA pt limited largely to AMB distances in the house d/t difficulty breathing, particularly in cold air.   Clinical Impression  Pt admitted with above diagnosis. Pt currently with functional limitations due to the deficits listed below (see "PT Problem List"). Upon entry, pt in bed, wife present. The pt is awake and agreeable to participate. The pt is alert and oriented x3, pleasant, conversational, and following simple commands consistently. Pt desatting on 8L with standing activity at bedside, but remains >89% while on 9L/min. Pt participates in standing balance activity x10 minutes, as well as some AMB at bedside. 1 LOB noted whilst sidestepping unsupported, PT helps stabilize. Pt safer with RW at thsi time rather than baseline SPC. Functional mobility assessment demonstrates increased effort/time requirements, poor tolerance, and need for physical assistance, whereas the patient performed these at a higher level of independence PTA.Pt will benefit from skilled PT intervention to increase independence and safety with basic mobility in preparation for discharge to the venue listed below.       Follow Up Recommendations Home health PT;Supervision for mobility/OOB    Equipment Recommendations  Rolling walker with 5" wheels    Recommendations for Other Services       Precautions / Restrictions Precautions Precautions: Fall Restrictions Weight Bearing Restrictions: No      Mobility  Bed Mobility Overal bed mobility: Needs Assistance Bed Mobility: Sidelying to Sit   Sidelying to sit: Supervision       General bed  mobility comments: high level effort and heay BUE use required to perform  Transfers Overall transfer level: Needs assistance Equipment used: None Transfers: Sit to/from Stand Sit to Stand: From elevated surface;Supervision;Min guard         General transfer comment: 5xSTS from EOB for activity, mild instability in #s 3 and 4   Ambulation/Gait Ambulation/Gait assistance: Min guard;Min assist Gait Distance (Feet): 15 Feet Assistive device: Rolling walker (2 wheeled)       General Gait Details: on 8L (as received) SpO2 dropping to 86%, with gradual recovery, but remains stable on 9L.   Stairs            Wheelchair Mobility    Modified Rankin (Stroke Patients Only)       Balance Overall balance assessment: Needs assistance Sitting-balance support: No upper extremity supported;Feet supported Sitting balance-Leahy Scale: Good     Standing balance support: No upper extremity supported;During functional activity Standing balance-Leahy Scale: Good Standing balance comment: able to perform some forward reaching and rotation, but LOB with sidestepping.              High level balance activites: Side stepping               Pertinent Vitals/Pain Pain Assessment: (some intermittent right flank pain associated with coughing)    Home Living Family/patient expects to be discharged to:: Private residence Living Arrangements: Spouse/significant other Available Help at Discharge: Family;Neighbor Type of Home: House Home Access: Stairs to enter   [employer] of Steps: 1 partial step (~3-4 inches)  Home Layout: One level Home Equipment: Cane - single point;Shower seat      Prior  Function Level of Independence: Needs assistance   Gait / Transfers Assistance Needed: Mostly household AMB, uses a SPC   ADL's / Homemaking Assistance Needed: independent   Comments: retired Administrator, 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   Dominant Hand: Right    Extremity/Trunk Assessment   Upper Extremity Assessment Upper Extremity Assessment: Generalized weakness;Overall [facility] for tasks assessed    Lower Extremity Assessment Lower Extremity Assessment: Generalized weakness;Overall WFL for tasks assessed       Communication   Communication: No difficulties  Cognition Arousal/Alertness: Awake/alert Behavior During Therapy: WFL for tasks assessed/performed Overall Cognitive Status: Within Functional Limits for tasks assessed                                        General Comments      Exercises Other Exercises Other Exercises: Standing unsupported forward rach with target tap: 1x10 bilat (head level) Other Exercises: Standing unsupports cross-body reach and slight trunk rotation target tap: 1x10 bilat Other Exercises: 5xSTS from EOB, hands free, surface elevated  Other Exercises: Side stepping 1x10 bilat, zero UE support    Assessment/Plan    PT Assessment Patient needs continued PT services  PT Problem List Cardiopulmonary status limiting activity;Decreased balance;Decreased activity tolerance;Decreased strength       PT Treatment Interventions DME instruction;Balance training;Gait training;Functional mobility training;Therapeutic activities;Therapeutic exercise;Stair training;Patient/family education    PT Goals (Current goals can be found in the Care Plan section)  Acute Rehab PT Goals Patient Stated Goal: get off oxygen and be modI c AMB and SPC  PT Goal Formulation: With patient Time For Goal Achievement: 01/22/18 Potential to Achieve Goals: Fair    Frequency Min 3X/week   Barriers to discharge        Co-evaluation               AM-PAC PT "6 Clicks" Mobility  Outcome Measure Help needed turning from your back to your side while in a flat bed without using bedrails?: None Help needed moving from lying on your back to sitting on the side of a flat bed without  using bedrails?: A Little Help needed moving to and from a bed to a chair (including a wheelchair)?: A Little Help needed standing up from a chair using your arms (e.g., wheelchair or bedside chair)?: A Little Help needed to walk in hospital room?: A Little Help needed climbing 3-5 steps with a railing? : A Lot 6 Click Score: 18    End of Session Equipment Utilized During Treatment: Oxygen Activity Tolerance: Patient tolerated treatment well Patient left: in bed;with family/visitor present;with call bell/phone within reach Nurse Communication: Mobility status PT Visit Diagnosis: Unsteadiness on feet (R26.81);Difficulty in walking, not elsewhere classified (R26.2)    Time: 9528-4132 PT Time Calculation (min) (ACUTE ONLY): 32 min   Charges:   PT Evaluation $PT Eval Low Complexity: 1 Low PT Treatments $Therapeutic Activity: 8-22 mins        1:30 PM, 01/15/18 Etta Grandchild, PT, DPT Physical Therapist - [place] [phone] (Pager)  [phone] (Office)     Simrat Kendrick C 01/15/2018, 1:28 PM

## 2018-01-15 NOTE — Progress Notes (Signed)
[facility] Progress Note  Patient: Joseph Berg UXL:[id number]   PCP: [name], MD DOB: [birth year]   DOA: 01/11/2018   DOS: 01/15/2018   Date of Service: the patient was seen and examined on 01/15/2018  Brief hospital course: Pt. with PMH of CAD S/P CABG, chronic systolic CHF, HTN, HLD, OSA on CPAP; admitted on 01/11/2018, presented with complaint of shortness of breath, was found to have multifocal pneumonia, possible acute on chronic CHF. Currently further plan is continue current management.  Subjective: Still coughing, still short of breath.  No nausea no vomiting no fever no chills.  Assessment and Plan: 1.  Acute hypoxic respiratory failure.  Multifocal community-acquired pneumonia COPD exacerbation Needing BiPAP overnight. Still on 10 L of oxygen. Continue nebulizer, continue steroids.  MRSA PCR negative.  Switching to IV cefepime and oral azithromycin. Switch to ceftriaxone Continue Mucinex. Add incentive spirometry. Cultures are so far negative. We will transition to oral ceftriaxone.  Continue for a total of 7-day treatment course. Repeat chest x-ray suggest persistent pneumonia.  Continue with current care. Incentive spirometry.  2.  Elevated troponin. Demand ischemia. CAD CABG Chronic systolic CHF. Cardiology was consulted, appreciate their input. Repeat echocardiogram currently pending. Held oral Lasix and losartan. Continue coreg/ d/w cardiology  Monitor. We will resume Lasix.  3.  OSA. Currently on BiPAP, will transition to CPAP when stable.  4.  Acute kidney injury. Likely cardiorenal hemodynamics. We will monitor. Hold losartan due to hyperkalemia   Diet: Full code DVT Prophylaxis: subcutaneous Heparin  Advance goals of care discussion: full code  Family Communication: family was present at bedside, at the time of interview. The pt provided permission to discuss medical plan with the family. Opportunity was given to ask  question and all questions were answered satisfactorily.   Disposition:  Discharge to home.  Consultants: cardiology  Procedures: Echocardiogram   Scheduled Meds: . aspirin  81 mg Oral Daily  . azithromycin  500 mg Oral Daily  . benzonatate  100 mg Oral TID  . carvedilol  12.5 mg Oral BID WC  . cefdinir  300 mg Oral Q12H  . chlorpheniramine-HYDROcodone  5 mL Oral Q12H  . cilostazol  100 mg Oral BID  . dextromethorphan-guaiFENesin  1 tablet Oral BID  . enoxaparin (LOVENOX) injection  40 mg Subcutaneous Q24H  . finasteride  5 mg Oral Daily  . ipratropium-albuterol  3 mL Nebulization Q6H  . methylPREDNISolone (SOLU-MEDROL) injection  60 mg Intravenous Q6H  . rosuvastatin  40 mg Oral q1800  . senna-docusate  1 tablet Oral BID  . sodium chloride flush  3 mL Intravenous Q12H   Continuous Infusions: . sodium chloride     PRN Meds: sodium chloride, acetaminophen, albuterol, HYDROcodone-acetaminophen, ondansetron **OR** ondansetron (ZOFRAN) IV, sodium chloride flush Antibiotics: Anti-infectives (From admission, onward)   Start     Dose/Rate Route Frequency Ordered Stop   01/15/18 1000  cefdinir (OMNICEF) capsule 300 mg     300 mg Oral Every 12 hours 01/15/18 0743 01/17/18 2359   01/13/18 1115  cefTRIAXone (ROCEPHIN) 1 g in sodium chloride 0.9 % 100 mL IVPB  Status:  Discontinued     1 g 200 mL/hr over 30 Minutes Intravenous Every 24 hours 01/13/18 1104 01/15/18 0742   01/12/18 1200  azithromycin (ZITHROMAX) tablet 500 mg     500 mg Oral Daily 01/12/18 1128 01/17/18 2359   01/11/18 2000  ceFEPIme (MAXIPIME) 2 g in sodium chloride 0.9 % 100 mL IVPB  Status:  Discontinued  2 g 200 mL/hr over 30 Minutes Intravenous Every 24 hours 01/11/18 1433 01/13/18 1104   01/11/18 1500  vancomycin (VANCOCIN) 1,250 mg in sodium chloride 0.9 % 250 mL IVPB  Status:  Discontinued     1,250 mg 166.7 mL/hr over 90 Minutes Intravenous Every 36 hours 01/11/18 1433 01/12/18 0745   01/11/18 1045   cefTRIAXone (ROCEPHIN) 1 g in sodium chloride 0.9 % 100 mL IVPB     1 g 200 mL/hr over 30 Minutes Intravenous  Once 01/11/18 1042 01/11/18 1246       Objective: Physical Exam: Vitals:   01/15/18 0825 01/15/18 1030 01/15/18 1227 01/15/18 1345  BP: (!) 146/73  136/72   Pulse: 69  70   Resp: 15  (!) 22   Temp: 98 F (36.7 C)  97.7 F (36.5 C)   TempSrc: Oral     SpO2: 94% 91% 90% 94%  Weight:      Height:        Intake/Output Summary (Last 24 hours) at 01/15/2018 1419 Last data filed at 01/15/2018 1302 Gross per 24 hour  Intake 120 ml  Output 850 ml  Net -730 ml   Filed Weights   01/11/18 1645 01/12/18 0927 01/13/18 0656  Weight: 105.2 kg 103.6 kg 101.2 kg   General: Alert, Awake and Oriented to Time, Place and Person. Appear in marked distress, affect appropriate Eyes: PERRL, Conjunctiva normal ENT: Oral Mucosa clear moist. Neck: difficult to assess  JVD, no Abnormal Mass Or lumps Cardiovascular: S1 and S2 Present, no Murmur, Peripheral Pulses Present Respiratory: increased respiratory effort, Bilateral Air entry equal and Decreased, no use of accessory muscle, bilateral Crackles, no wheezes Abdomen: Bowel Sound present, Soft and no tenderness, no hernia Skin: no redness, no Rash, non induration Extremities: on Pedal edema, on calf tenderness Neurologic: Grossly no focal neuro deficit. Bilaterally Equal motor strength  Data Reviewed: CBC: Recent Labs  Lab 01/11/18 0935 01/12/18 0249 01/13/18 0858 01/14/18 0915 01/15/18 0215  WBC 14.5* 14.2* 16.7* 17.3* 15.0*  NEUTROABS 11.9*  --  15.0*  --   --   HGB 13.8 13.0 12.7* 13.5 12.6*  HCT 43.4 40.8 40.9 42.5 39.4  MCV 93.1 91.9 91.9 91.2 89.7  PLT 160 145* 201 199 409   Basic Metabolic Panel: Recent Labs  Lab 01/11/18 0935 01/12/18 0249 01/13/18 0858 01/14/18 0915 01/15/18 0215  NA 141 143 136 137 138  K 4.3 4.5 5.2* 4.3 4.5  CL 102 105 100 99 101  CO2 28 26 26 26 29   GLUCOSE 147* 176* 181* 204* 206*  BUN  23 33* 56* 57* 56*  CREATININE 1.57* 1.42* 1.36* 1.42* 1.52*  CALCIUM 9.1 9.1 9.0 9.0 8.7*  MG  --   --   --   --  2.8*    Liver Function Tests: Recent Labs  Lab 01/11/18 0935 01/13/18 0858  AST 22 46*  ALT 14 25  ALKPHOS 39 45  BILITOT 1.0 2.0*  PROT 5.6* 5.4*  ALBUMIN 3.1* 2.7*   No results for input(s): LIPASE, AMYLASE in the last 168 hours. No results for input(s): AMMONIA in the last 168 hours. Coagulation Profile: No results for input(s): INR, PROTIME in the last 168 hours. Cardiac Enzymes: Recent Labs  Lab 01/11/18 0935 01/11/18 1646 01/11/18 2043 01/12/18 0249  TROPONINI 0.19* 0.23* 0.23* 0.18*   BNP (last 3 results) No results for input(s): PROBNP in the last 8760 hours. CBG: No results for input(s): GLUCAP in the last 168 hours. Studies:  Dg Chest Port 1 View  Result Date: 01/15/2018 CLINICAL DATA:  Shortness of breath EXAM: PORTABLE CHEST 1 VIEW COMPARISON:  01/12/2018 FINDINGS: Prior CABG. Cardiomegaly. Diffuse interstitial and alveolar opacities lik, not significantly changed. Decreasing lung volumes. Possible small effusions. IMPRESSION: Decreasing lung volumes. Continued bilateral airspace opacities which could reflect edema or infection. Suspect small effusions. Electronically Signed   By: Rolm Baptise M.D.   On: 01/15/2018 07:51     Time spent: 35 minutes  Author: Berle Mull, MD [facility] Pager: [phone] 01/15/2018 2:19 PM  Between 7PM-7AM, please contact night-coverage at [email or web address], password [facility]

## 2018-01-15 NOTE — Progress Notes (Signed)
Patient has not needed BIPAP in 24 hours. Has a hx of sleep apnea but says he has not worn his CPAP in a long time says he hates it. Patient states he does not want to go back on machine right now. Patient in no distress. RT will continue to monitor.

## 2018-01-16 ENCOUNTER — Other Ambulatory Visit: Payer: Medicare PPO

## 2018-01-16 LAB — CULTURE, BLOOD (ROUTINE X 2)
Culture: NO GROWTH
Culture: NO GROWTH
Special Requests: ADEQUATE
Special Requests: ADEQUATE

## 2018-01-16 LAB — BASIC METABOLIC PANEL
Anion gap: 10 (ref 5–15)
BUN: 50 mg/dL — ABNORMAL HIGH (ref 8–23)
CO2: 28 mmol/L (ref 22–32)
Calcium: 8.9 mg/dL (ref 8.9–10.3)
Chloride: 102 mmol/L (ref 98–111)
Creatinine, Ser: 2.05 mg/dL — ABNORMAL HIGH (ref 0.61–1.24)
GFR calc Af Amer: 34 mL/min — ABNORMAL LOW (ref >60)
GFR calc non Af Amer: 29 mL/min — ABNORMAL LOW (ref >60)
Glucose, Bld: 189 mg/dL — ABNORMAL HIGH (ref 70–99)
Potassium: 4.2 mmol/L (ref 3.5–5.1)
Sodium: 140 mmol/L (ref 135–145)

## 2018-01-16 LAB — CBC
HEMATOCRIT: 39.7 % (ref 39.0–52.0)
Hemoglobin: 12.9 g/dL — ABNORMAL LOW (ref 13.0–17.0)
MCH: 28.8 pg (ref 26.0–34.0)
MCHC: 32.5 g/dL (ref 30.0–36.0)
MCV: 88.6 fL (ref 80.0–100.0)
Platelets: 185 10*3/uL (ref 150–400)
RBC: 4.48 MIL/uL (ref 4.22–5.81)
RDW: 12.7 % (ref 11.5–15.5)
WBC: 15.2 10*3/uL — AB (ref 4.0–10.5)
nRBC: 0 % (ref 0.0–0.2)

## 2018-01-16 MED ORDER — GUAIFENESIN ER 600 MG PO TB12
600.0000 mg | ORAL_TABLET | Freq: Two times a day (BID) | ORAL | Status: DC
  Administered 2018-01-16 – 2018-01-18 (×5): 600 mg via ORAL
  Filled 2018-01-16 (×5): qty 1

## 2018-01-16 MED ORDER — METHYLPREDNISOLONE SODIUM SUCC 125 MG IJ SOLR
60.0000 mg | Freq: Two times a day (BID) | INTRAMUSCULAR | Status: DC
  Administered 2018-01-16 – 2018-01-17 (×2): 60 mg via INTRAVENOUS
  Filled 2018-01-16 (×2): qty 2

## 2018-01-16 MED ORDER — HEPARIN SODIUM (PORCINE) 5000 UNIT/ML IJ SOLN
5000.0000 [IU] | Freq: Three times a day (TID) | INTRAMUSCULAR | Status: DC
  Administered 2018-01-16 – 2018-01-18 (×5): 5000 [IU] via SUBCUTANEOUS
  Filled 2018-01-16 (×6): qty 1

## 2018-01-16 NOTE — Care Management Note (Addendum)
Case Management Note  Patient Details  Name: Joseph Berg MRN: [id number] Date of Birth: March 04, 1933  Subjective/Objective:   From home with wife, NCM offered choice for HHPT, patient and wife chose [facility], referral given to [facility] with [facility], soc will begin 24-48 hrs post dc.                  Action/Plan: DC home when ready. Will need HHPT order with face to face.  Expected Discharge Date:                  Expected Discharge Plan:  [place]  In-House Referral:     Discharge planning Services  CM Consult  Post Acute Care Choice:  Home Health Choice offered to:  Patient, Spouse  DME Arranged:   oxygen DME Agency:   [facility]  HH Arranged:  PT, RN [facility] Agency:  [place]  Status of Service:  In process, will continue to follow  If discussed at Long Length of Stay Meetings, dates discussed:    Additional Comments:  Zenon Mayo, RN 01/16/2018, 4:42 PM

## 2018-01-16 NOTE — Progress Notes (Signed)
[facility] Progress Note  Patient: Joseph Berg WCB:[id number]   PCP: [name], MD DOB: 1933/02/10   DOA: 01/11/2018   DOS: 01/16/2018   Date of Service: the patient was seen and examined on 01/16/2018  Brief hospital course: Pt. with PMH of CAD S/P CABG, chronic systolic CHF, HTN, HLD, OSA on CPAP; admitted on 01/11/2018, presented with complaint of shortness of breath, was found to have multifocal pneumonia, possible acute on chronic CHF.  Cardiology was consulted, felt that the patient does not have CHF flareup and recommended conservative management.  Treated with IV antibiotics, IV steroids and nebulizer as well as BiPAP. Currently further plan is continue current management.  Subjective: Significant coughing still present.  Breathing is improving somewhat.  No nausea no vomiting.  Oral intake is improving.  Able to ambulate now in the room  Assessment and Plan: 1.  Acute hypoxic respiratory failure.  Multifocal community-acquired pneumonia COPD exacerbation Needing BiPAP on admission.  Currently not needing it in last 24 hours.. Still on 10 L of oxygen. Continue nebulizer, continue steroids.  MRSA PCR negative.   Initially was on IV vancomycin and cefepime, later on IV cefepime and azithromycin, later on IV ceftriaxone and azithromycin. Cultures are so far negative. Repeat chest x-ray suggest persistent pneumonia. Currently on oral cefdinir last day along with azithromycin. Continue Mucinex. Tussionex scheduled and Hycodan as needed. BiPAP PRN, flutter device, incentive spirometry.  2.  Elevated troponin. Demand ischemia. CAD CABG Chronic systolic CHF. Cardiology was consulted, appreciate their input. Do not think the patient actually has a CHF exacerbation. Recommend continue Coreg.  Holding Lasix and losartan in the setting of worsening renal function. Echocardiogram shows actually improvement in EF from 35% to 50%.  3.  OSA. Currently on BiPAP  PRN. Noncompliant with CPAP at home.  4.  Acute kidney injury. Likely cardiorenal hemodynamics. Lasix was continued.  But due to worsening renal function on 01/16/2018 currently Lasix is on hold.  Recheck tomorrow. Hold losartan due to hyperkalemia   5.  Obesity  Body mass index is 33.6 kg/m.   Diet: cardiac diet DVT Prophylaxis: subcutaneous Heparin-changing Lovenox to heparin in the setting of worsening renal function  Advance goals of care discussion: full code  Family Communication: family was present at bedside, at the time of interview. The pt provided permission to discuss medical plan with the family. Opportunity was given to ask question and all questions were answered satisfactorily.   Disposition:  Discharge to home in 3-4 days, still on 8 to 10 L of oxygen.  Consultants: cardiology  Procedures: Echocardiogram   Scheduled Meds: . aspirin  81 mg Oral Daily  . azithromycin  500 mg Oral Daily  . benzonatate  100 mg Oral TID  . carvedilol  12.5 mg Oral BID WC  . cefdinir  300 mg Oral Q12H  . chlorpheniramine-HYDROcodone  5 mL Oral Q12H  . cilostazol  100 mg Oral BID  . finasteride  5 mg Oral Daily  . guaiFENesin  600 mg Oral BID  . heparin injection (subcutaneous)  5,000 Units Subcutaneous Q8H  . ipratropium-albuterol  3 mL Nebulization TID  . methylPREDNISolone (SOLU-MEDROL) injection  60 mg Intravenous Q12H  . rosuvastatin  40 mg Oral q1800  . senna-docusate  1 tablet Oral BID  . sodium chloride flush  3 mL Intravenous Q12H   Continuous Infusions: . sodium chloride     PRN Meds: sodium chloride, acetaminophen, albuterol, HYDROcodone-acetaminophen, HYDROcodone-homatropine, ondansetron **OR** ondansetron (ZOFRAN) IV, sodium chloride  flush Antibiotics: Anti-infectives (From admission, onward)   Start     Dose/Rate Route Frequency Ordered Stop   01/15/18 1000  cefdinir (OMNICEF) capsule 300 mg     300 mg Oral Every 12 hours 01/15/18 0743 01/17/18 2359    01/13/18 1115  cefTRIAXone (ROCEPHIN) 1 g in sodium chloride 0.9 % 100 mL IVPB  Status:  Discontinued     1 g 200 mL/hr over 30 Minutes Intravenous Every 24 hours 01/13/18 1104 01/15/18 0742   01/12/18 1200  azithromycin (ZITHROMAX) tablet 500 mg     500 mg Oral Daily 01/12/18 1128 01/17/18 2359   01/11/18 2000  ceFEPIme (MAXIPIME) 2 g in sodium chloride 0.9 % 100 mL IVPB  Status:  Discontinued     2 g 200 mL/hr over 30 Minutes Intravenous Every 24 hours 01/11/18 1433 01/13/18 1104   01/11/18 1500  vancomycin (VANCOCIN) 1,250 mg in sodium chloride 0.9 % 250 mL IVPB  Status:  Discontinued     1,250 mg 166.7 mL/hr over 90 Minutes Intravenous Every 36 hours 01/11/18 1433 01/12/18 0745   01/11/18 1045  cefTRIAXone (ROCEPHIN) 1 g in sodium chloride 0.9 % 100 mL IVPB     1 g 200 mL/hr over 30 Minutes Intravenous  Once 01/11/18 1042 01/11/18 1246       Objective: Physical Exam: Vitals:   01/16/18 0809 01/16/18 0920 01/16/18 1123 01/16/18 1404  BP:  (!) 134/58 140/80   Pulse:  82 80   Resp:  15 20   Temp:  98.3 F (36.8 C) 98.2 F (36.8 C)   TempSrc:  Oral Oral   SpO2: 93% 93% 94% 97%  Weight:      Height:        Intake/Output Summary (Last 24 hours) at 01/16/2018 1548 Last data filed at 01/16/2018 0557 Gross per 24 hour  Intake 0 ml  Output 1225 ml  Net -1225 ml   Filed Weights   01/12/18 0927 01/13/18 0656 01/16/18 0555  Weight: 103.6 kg 101.2 kg 103.2 kg   General: Alert, Awake and Oriented to Time, Place and Person. Appear in moderate distress, affect appropriate Eyes: PERRL, Conjunctiva normal ENT: Oral Mucosa clear moist. Neck: no JVD, no Abnormal Mass Or lumps Cardiovascular: S1 and S2 Present, no Murmur, Peripheral Pulses Present Respiratory: increased respiratory effort, Bilateral Air entry equal and Decreased, no use of accessory muscle, bilateral basal Crackles, no wheezes Abdomen: Bowel Sound present, Soft and no tenderness, no hernia Skin: no redness, no Rash,  no induration Extremities: trace Pedal edema, no calf tenderness Neurologic: Grossly no focal neuro deficit. Bilaterally Equal motor strength  Data Reviewed: CBC: Recent Labs  Lab 01/11/18 0935 01/12/18 0249 01/13/18 0858 01/14/18 0915 01/15/18 0215 01/16/18 0314  WBC 14.5* 14.2* 16.7* 17.3* 15.0* 15.2*  NEUTROABS 11.9*  --  15.0*  --   --   --   HGB 13.8 13.0 12.7* 13.5 12.6* 12.9*  HCT 43.4 40.8 40.9 42.5 39.4 39.7  MCV 93.1 91.9 91.9 91.2 89.7 88.6  PLT 160 145* 201 199 180 219   Basic Metabolic Panel: Recent Labs  Lab 01/12/18 0249 01/13/18 0858 01/14/18 0915 01/15/18 0215 01/16/18 0314  NA 143 136 137 138 140  K 4.5 5.2* 4.3 4.5 4.2  CL 105 100 99 101 102  CO2 26 26 26 29 28   GLUCOSE 176* 181* 204* 206* 189*  BUN 33* 56* 57* 56* 50*  CREATININE 1.42* 1.36* 1.42* 1.52* 2.05*  CALCIUM 9.1 9.0 9.0 8.7* 8.9  MG  --   --   --  2.8*  --     Liver Function Tests: Recent Labs  Lab 01/11/18 0935 01/13/18 0858  AST 22 46*  ALT 14 25  ALKPHOS 39 45  BILITOT 1.0 2.0*  PROT 5.6* 5.4*  ALBUMIN 3.1* 2.7*   No results for input(s): LIPASE, AMYLASE in the last 168 hours. No results for input(s): AMMONIA in the last 168 hours. Coagulation Profile: No results for input(s): INR, PROTIME in the last 168 hours. Cardiac Enzymes: Recent Labs  Lab 01/11/18 0935 01/11/18 1646 01/11/18 2043 01/12/18 0249  TROPONINI 0.19* 0.23* 0.23* 0.18*   BNP (last 3 results) No results for input(s): PROBNP in the last 8760 hours. CBG: No results for input(s): GLUCAP in the last 168 hours. Studies: No results found.   Time spent: 35 minutes  Author: Berle Mull, MD [facility] Pager: [phone] 01/16/2018 3:48 PM  Between 7PM-7AM, please contact night-coverage at [email or web address], password [facility]

## 2018-01-17 LAB — CBC
HCT: 40 % (ref 39.0–52.0)
Hemoglobin: 12.8 g/dL — ABNORMAL LOW (ref 13.0–17.0)
MCH: 28.6 pg (ref 26.0–34.0)
MCHC: 32 g/dL (ref 30.0–36.0)
MCV: 89.3 fL (ref 80.0–100.0)
NRBC: 0 % (ref 0.0–0.2)
Platelets: 174 10*3/uL (ref 150–400)
RBC: 4.48 MIL/uL (ref 4.22–5.81)
RDW: 12.8 % (ref 11.5–15.5)
WBC: 13.7 10*3/uL — ABNORMAL HIGH (ref 4.0–10.5)

## 2018-01-17 LAB — BASIC METABOLIC PANEL
Anion gap: 7 (ref 5–15)
Anion gap: 11 (ref 5–15)
BUN: 42 mg/dL — ABNORMAL HIGH (ref 8–23)
BUN: 41 mg/dL — AB (ref 8–23)
CO2: 32 mmol/L (ref 22–32)
CO2: 30 mmol/L (ref 22–32)
Calcium: 9.1 mg/dL (ref 8.9–10.3)
Calcium: 9 mg/dL (ref 8.9–10.3)
Chloride: 101 mmol/L (ref 98–111)
Chloride: 100 mmol/L (ref 98–111)
Creatinine, Ser: 1.21 mg/dL (ref 0.61–1.24)
Creatinine, Ser: 1.26 mg/dL — ABNORMAL HIGH (ref 0.61–1.24)
GFR calc Af Amer: >60 mL/min (ref >60)
GFR calc non Af Amer: 55 mL/min — ABNORMAL LOW (ref >60)
GFR calc non Af Amer: 53 mL/min — ABNORMAL LOW (ref >60)
Glucose, Bld: 201 mg/dL — ABNORMAL HIGH (ref 70–99)
Glucose, Bld: 227 mg/dL — ABNORMAL HIGH (ref 70–99)
Potassium: 4.3 mmol/L (ref 3.5–5.1)
Potassium: 4 mmol/L (ref 3.5–5.1)
SODIUM: 141 mmol/L (ref 135–145)
Sodium: 140 mmol/L (ref 135–145)

## 2018-01-17 MED ORDER — METHYLPREDNISOLONE SODIUM SUCC 40 MG IJ SOLR
40.0000 mg | Freq: Two times a day (BID) | INTRAMUSCULAR | Status: AC
  Administered 2018-01-17: 40 mg via INTRAVENOUS
  Filled 2018-01-17: qty 1

## 2018-01-17 MED ORDER — PREDNISONE 20 MG PO TABS
40.0000 mg | ORAL_TABLET | Freq: Every day | ORAL | Status: DC
  Administered 2018-01-18: 40 mg via ORAL
  Filled 2018-01-17: qty 2

## 2018-01-17 MED ORDER — FUROSEMIDE 10 MG/ML IJ SOLN
20.0000 mg | Freq: Two times a day (BID) | INTRAMUSCULAR | Status: DC
  Administered 2018-01-17 – 2018-01-18 (×3): 20 mg via INTRAVENOUS
  Filled 2018-01-17 (×3): qty 2

## 2018-01-17 NOTE — Progress Notes (Signed)
[facility] Progress Note  Patient: Joseph Berg KVQ:[id number]   PCP: [name], MD DOB: [birth year]   DOA: 01/11/2018   DOS: 01/17/2018   Date of Service: the patient was seen and examined on 01/17/2018  Brief hospital course: Pt. with PMH of CAD S/P CABG, chronic systolic CHF, HTN, HLD, OSA on CPAP; admitted on 01/11/2018, presented with complaint of shortness of breath, was found to have multifocal pneumonia, possible acute on chronic CHF.  Cardiology was consulted, felt that the patient does not have CHF flareup and recommended conservative management.  Treated with IV antibiotics, IV steroids and nebulizer as well as BiPAP.  Subjective:  -Breathing continues to improve -Anxious to go home  Assessment and Plan: 1.  Acute hypoxic respiratory failure.   -Due to multifocal community-acquired pneumonia and COPD exacerbation -Required BiPAP on admission and subsequently 6 to 10 L of oxygen -Finally improving -Was on broad-spectrum antibiotics followed by IV ceftriaxone and azithromycin, will complete course of oral cefdinir today -Clinically appears volume overloaded this well bilateral pleural effusions noted on last x-ray -Start IV Lasix today, monitor kidney function -Wean O2, ambulate  2.  Elevated troponin. -Due to demand ischemia in the setting of multifocal pneumonia/respiratory failure -Cardiology was consulted, recommended conservative management  3.  Acute on chronic systolic CHF -CAD/CABG -Echocardiogram shows improvement in EF from 35 to 50% -Continue Coreg -Start low-dose IV Lasix today, monitor urine output and kidney function closely  4.  Acute kidney injury -Likely cardiorenal -Losartan on hold, IV Lasix today, monitor -Kidney function has normalized  5.  Obstructive sleep apnea -Continue CPAP nightly  Diet: cardiac diet DVT Prophylaxis: subcutaneous Heparin-changing Lovenox to heparin in the setting of worsening renal  function  Advance goals of care discussion: full code  Family Communication: Wife at bedside  Disposition: Home possibly in 24 to 48 hours  Consultants: cardiology  Procedures: Echocardiogram   Scheduled Meds: . aspirin  81 mg Oral Daily  . benzonatate  100 mg Oral TID  . carvedilol  12.5 mg Oral BID WC  . cefdinir  300 mg Oral Q12H  . chlorpheniramine-HYDROcodone  5 mL Oral Q12H  . cilostazol  100 mg Oral BID  . finasteride  5 mg Oral Daily  . furosemide  20 mg Intravenous Q12H  . guaiFENesin  600 mg Oral BID  . heparin injection (subcutaneous)  5,000 Units Subcutaneous Q8H  . ipratropium-albuterol  3 mL Nebulization TID  . methylPREDNISolone (SOLU-MEDROL) injection  40 mg Intravenous Q12H  . [START ON 01/18/2018] predniSONE  40 mg Oral Q breakfast  . rosuvastatin  40 mg Oral q1800  . senna-docusate  1 tablet Oral BID  . sodium chloride flush  3 mL Intravenous Q12H   Continuous Infusions: . sodium chloride     PRN Meds: sodium chloride, acetaminophen, albuterol, HYDROcodone-acetaminophen, HYDROcodone-homatropine, ondansetron **OR** ondansetron (ZOFRAN) IV, sodium chloride flush Antibiotics: Anti-infectives (From admission, onward)   Start     Dose/Rate Route Frequency Ordered Stop   01/15/18 1000  cefdinir (OMNICEF) capsule 300 mg     300 mg Oral Every 12 hours 01/15/18 0743 01/17/18 2359   01/13/18 1115  cefTRIAXone (ROCEPHIN) 1 g in sodium chloride 0.9 % 100 mL IVPB  Status:  Discontinued     1 g 200 mL/hr over 30 Minutes Intravenous Every 24 hours 01/13/18 1104 01/15/18 0742   01/12/18 1200  azithromycin (ZITHROMAX) tablet 500 mg     500 mg Oral Daily 01/12/18 1128 01/17/18 0809   01/11/18  2000  ceFEPIme (MAXIPIME) 2 g in sodium chloride 0.9 % 100 mL IVPB  Status:  Discontinued     2 g 200 mL/hr over 30 Minutes Intravenous Every 24 hours 01/11/18 1433 01/13/18 1104   01/11/18 1500  vancomycin (VANCOCIN) 1,250 mg in sodium chloride 0.9 % 250 mL IVPB  Status:   Discontinued     1,250 mg 166.7 mL/hr over 90 Minutes Intravenous Every 36 hours 01/11/18 1433 01/12/18 0745   01/11/18 1045  cefTRIAXone (ROCEPHIN) 1 g in sodium chloride 0.9 % 100 mL IVPB     1 g 200 mL/hr over 30 Minutes Intravenous  Once 01/11/18 1042 01/11/18 1246       Objective: Physical Exam: Vitals:   01/17/18 0808 01/17/18 0816 01/17/18 0900 01/17/18 1146  BP: (!) 183/101  (!) 142/112 (!) 154/93  Pulse: 76 76 87 79  Resp:  (!) 21 (!) 22   Temp:   98 F (36.7 C) 98.1 F (36.7 C)  TempSrc:   Oral Oral  SpO2:  93% 90% 91%  Weight:      Height:        Intake/Output Summary (Last 24 hours) at 01/17/2018 1350 Last data filed at 01/17/2018 0600 Gross per 24 hour  Intake -  Output 750 ml  Net -750 ml   Filed Weights   01/13/18 0656 01/16/18 0555 01/17/18 0500  Weight: 101.2 kg 103.2 kg 103.6 kg   Gen: Awake, Alert, Oriented X 3, obese frail laying in bed, no distress HEENT: PERRLA, Neck supple, no JVD Lungs: Bilateral rhonchi and decreased breath sounds at both bases CVS: RRR,No Gallops,Rubs or new Murmurs Abd: soft, Non tender, non distended, BS present Extremities: Trace edema Skin: no new rashes Data Reviewed: CBC: Recent Labs  Lab 01/11/18 0935  01/13/18 0858 01/14/18 0915 01/15/18 0215 01/16/18 0314 01/17/18 0344  WBC 14.5*   < > 16.7* 17.3* 15.0* 15.2* 13.7*  NEUTROABS 11.9*  --  15.0*  --   --   --   --   HGB 13.8   < > 12.7* 13.5 12.6* 12.9* 12.8*  HCT 43.4   < > 40.9 42.5 39.4 39.7 40.0  MCV 93.1   < > 91.9 91.2 89.7 88.6 89.3  PLT 160   < > 201 199 180 185 174   < > = values in this interval not displayed.   Basic Metabolic Panel: Recent Labs  Lab 01/13/18 0858 01/14/18 0915 01/15/18 0215 01/16/18 0314 01/17/18 0344  NA 136 137 138 140 140  K 5.2* 4.3 4.5 4.2 4.3  CL 100 99 101 102 101  CO2 26 26 29 28  32  GLUCOSE 181* 204* 206* 189* 201*  BUN 56* 57* 56* 50* 42*  CREATININE 1.36* 1.42* 1.52* 2.05* 1.21  CALCIUM 9.0 9.0 8.7* 8.9  9.1  MG  --   --  2.8*  --   --     Liver Function Tests: Recent Labs  Lab 01/11/18 0935 01/13/18 0858  AST 22 46*  ALT 14 25  ALKPHOS 39 45  BILITOT 1.0 2.0*  PROT 5.6* 5.4*  ALBUMIN 3.1* 2.7*   No results for input(s): LIPASE, AMYLASE in the last 168 hours. No results for input(s): AMMONIA in the last 168 hours. Coagulation Profile: No results for input(s): INR, PROTIME in the last 168 hours. Cardiac Enzymes: Recent Labs  Lab 01/11/18 0935 01/11/18 1646 01/11/18 2043 01/12/18 0249  TROPONINI 0.19* 0.23* 0.23* 0.18*   BNP (last 3 results) No results for  input(s): PROBNP in the last 8760 hours. CBG: No results for input(s): GLUCAP in the last 168 hours. Studies: No results found.   Time spent: 35 minutes  Domenic Polite, MD [facility] Page via [email or web address]  01/17/2018 1:50 PM  Between 7PM-7AM, please contact night-coverage at [email or web address], password [facility]

## 2018-01-17 NOTE — Progress Notes (Signed)
Physical Therapy Treatment Patient Details Name: Joseph Berg MRN: [id number] DOB: Aug 16, 1933 Today's Date: 01/17/2018    History of Present Illness Joseph Berg is an 84yo male who comes to [facility] on 12/5 p 5d SOB, already started ABX adn steroids witout much change: pt admitted with PNA + COPD exacerbation. PMH: CAD s/p CABG 1994, sCHF, emphysema, OSA on CPAP. PTA pt limited largely to AMB distances in the house d/t difficulty breathing, particularly in cold air.     PT Comments    Pt received at EOB, finishing toiletting with RN.  Pt agreeable to mobility, able to progress AMB to 44ft very slowly with RW and chair follow. Pt rests then repeats back to room. Transfers safe and steady but requiring some verbal cues. Legs are weak and somewhat limiting.SpO2 maintained WFL on 6L/min AMB, and returned to 4L/min s/p gait. Pt progressing slowly.   Follow Up Recommendations  Home health PT;Supervision for mobility/OOB     Equipment Recommendations  Rolling walker with 5" wheels    Recommendations for Other Services       Precautions / Restrictions Precautions Precautions: Fall Restrictions Weight Bearing Restrictions: No    Mobility  Bed Mobility               General bed mobility comments: received up at EOB.   Transfers Overall transfer level: Needs assistance Equipment used: None Transfers: Sit to/from Stand Sit to Stand: From elevated surface;Supervision         General transfer comment: surface mildly elevated, VC for safe RW use.   Ambulation/Gait Ambulation/Gait assistance: Min guard;Supervision Gait Distance (Feet): 40 Feet(69ft; 70ft ) Assistive device: Rolling walker (2 wheeled)   Gait velocity: 0.58m/s  Gait velocity interpretation: <1.31 ft/sec, indicative of household Metallurgist Rankin (Stroke Patients Only)       Balance Overall balance assessment: Needs  assistance Sitting-balance support: No upper extremity supported;Feet supported Sitting balance-Leahy Scale: Good     Standing balance support: No upper extremity supported;During functional activity Standing balance-Leahy Scale: Fair                              Cognition Arousal/Alertness: Awake/alert Behavior During Therapy: WFL for tasks assessed/performed Overall Cognitive Status: Within Functional Limits for tasks assessed                                        Exercises      General Comments        Pertinent Vitals/Pain Pain Assessment: No/denies pain    Home Living                      Prior Function            PT Goals (current goals can now be found in the care plan section) Acute Rehab PT Goals Patient Stated Goal: get off oxygen and be modI c AMB and SPC  PT Goal Formulation: With patient Time For Goal Achievement: 01/22/18 Potential to Achieve Goals: Fair Progress towards PT goals: Progressing toward goals    Frequency    Min 3X/week      PT Plan Current plan remains appropriate    Co-evaluation  AM-PAC PT "6 Clicks" Mobility   Outcome Measure  Help needed turning from your back to your side while in a flat bed without using bedrails?: None Help needed moving from lying on your back to sitting on the side of a flat bed without using bedrails?: A Little Help needed moving to and from a bed to a chair (including a wheelchair)?: A Little Help needed standing up from a chair using your arms (e.g., wheelchair or bedside chair)?: A Little Help needed to walk in hospital room?: A Little Help needed climbing 3-5 steps with a railing? : A Lot 6 Click Score: 18    End of Session Equipment Utilized During Treatment: Oxygen Activity Tolerance: Patient tolerated treatment well;Patient limited by fatigue(leg getting tired; DOE not a limting factor) Patient left: in chair;with call bell/phone within  reach Nurse Communication: Mobility status PT Visit Diagnosis: Unsteadiness on feet (R26.81);Difficulty in walking, not elsewhere classified (R26.2)     Time: 4818-5909 PT Time Calculation (min) (ACUTE ONLY): 32 min  Charges:  $Therapeutic Activity: 8-22 mins                     4:25 PM, 01/17/18 Etta Grandchild, PT, DPT Physical Therapist - [place] [phone] (Pager)  [phone] (Office)       Abner Ardis C 01/17/2018, 4:23 PM

## 2018-01-17 NOTE — Progress Notes (Signed)
Pt resting comfortably- no distress.  Pt has not worn BIPAP in several nights.  Machine at bedside for patient use.

## 2018-01-18 LAB — CBC
HCT: 41 % (ref 39.0–52.0)
Hemoglobin: 13.1 g/dL (ref 13.0–17.0)
MCH: 28.8 pg (ref 26.0–34.0)
MCHC: 32 g/dL (ref 30.0–36.0)
MCV: 90.1 fL (ref 80.0–100.0)
NRBC: 0 % (ref 0.0–0.2)
Platelets: 169 10*3/uL (ref 150–400)
RBC: 4.55 MIL/uL (ref 4.22–5.81)
RDW: 12.6 % (ref 11.5–15.5)
WBC: 13.6 10*3/uL — AB (ref 4.0–10.5)

## 2018-01-18 LAB — BASIC METABOLIC PANEL
Anion gap: 8 (ref 5–15)
BUN: 43 mg/dL — ABNORMAL HIGH (ref 8–23)
CO2: 32 mmol/L (ref 22–32)
Calcium: 9.1 mg/dL (ref 8.9–10.3)
Chloride: 102 mmol/L (ref 98–111)
Creatinine, Ser: 1.35 mg/dL — ABNORMAL HIGH (ref 0.61–1.24)
GFR calc Af Amer: 56 mL/min — ABNORMAL LOW (ref >60)
GFR, EST NON AFRICAN AMERICAN: 49 mL/min — AB (ref >60)
Glucose, Bld: 187 mg/dL — ABNORMAL HIGH (ref 70–99)
Potassium: 4.6 mmol/L (ref 3.5–5.1)
Sodium: 142 mmol/L (ref 135–145)

## 2018-01-18 MED ORDER — GUAIFENESIN ER 600 MG PO TB12: 600.0000 mg | ORAL_TABLET | Freq: Two times a day (BID) | ORAL | 0 refills | Status: AC

## 2018-01-18 MED ORDER — SENNOSIDES-DOCUSATE SODIUM 8.6-50 MG PO TABS: 1.0000 | ORAL_TABLET | Freq: Every evening | ORAL | 0 refills | Status: DC | PRN

## 2018-01-18 MED ORDER — PREDNISONE 20 MG PO TABS: 20.0000 mg | ORAL_TABLET | Freq: Every day | ORAL | 0 refills | Status: DC

## 2018-01-18 NOTE — Discharge Summary (Signed)
Physician Discharge Summary  Joseph Berg ALP:[id number] DOB: [birth year] DOA: 01/11/2018  PCP: [name], MD  Admit date: 01/11/2018 Discharge date: 01/18/2018  Time spent: 35 minutes  Recommendations for Outpatient Follow-up:  1. PCP in 1 week, FU CXR in 4-6weeks 2. Home health PT/RN   Discharge Diagnoses:  Principal Problem:   Multifocal pneumonia   COPD exacerbation   Elevated troponin   S/P CABG x 2   Demand ischemia (HCC)   Acute on chronic combined systolic and diastolic CHF (congestive heart failure) (HCC)   Acute kidney injury   CAD    Discharge Condition: stable  Diet recommendation: low sodium heart healthy  Filed Weights   01/16/18 0555 01/17/18 0500 01/18/18 0606  Weight: 103.2 kg 103.6 kg 105.5 kg    History of present illness:  Pt. with PMH of CAD S/P CABG, chronic systolic CHF, HTN, HLD, OSA on CPAP; admitted on12/06/2017, presented with complaint of shortness of breath, was found to have multifocal pneumonia, possible acute on chronic CHF  Hospital Course:   1. Acute hypoxic respiratory failure.  -Due to multi lobar pneumonia and COPD exacerbation -Required BiPAP on admission, required required high flow O2 initial part of hospitalization, weaned down from 10 L high flow to 2 L today at discharge -Was on broad-spectrum antibiotics with IV ceftriaxone and azithromycin subsequently transitioned to oral cefdinir, and prednisone taper -Was also slightly volume overloaded in the latter part of hospitalization and diuresed with IV Lasix -completed abx  -discharged home on 2L O2 -Home health physical therapy and nursing set up at discharge -Recommend follow-up chest x-ray in 4 to 6 weeks  2. Elevated troponin. -Due to demand ischemia in the setting of multifocal pneumonia/respiratory failure -Cardiology was consulted, recommended conservative management  3.  Acute on chronic systolic CHF -CAD/CABG -Echocardiogram shows improvement  in EF from 35 to 50% -Continue Coreg -Next with IV Lasix, clinically felt to be euvolemic at this time, transition to home dose of oral Lasix 40 mg daily at discharge  4.  Acute kidney injury -Likely cardiorenal, -Creatinine stabilized and improved to 1.2 range, losartan discontinued -Diuretics resumed  5.  Obstructive sleep apnea -Continue CPAP nightly    Discharge Exam: Vitals:   01/18/18 0812 01/18/18 0813  BP:    Pulse:  84  Resp:  18  Temp:    SpO2: 92% 92%    General: AAOx3 Cardiovascular: S1S2/RRR Respiratory: decreased BS at bases  Discharge Instructions   Discharge Instructions    Diet - low sodium heart healthy   Complete by:  As directed    Increase activity slowly   Complete by:  As directed      Allergies as of 01/18/2018      Reactions   Hydrochlorothiazide Other (See Comments)   Affected renal function.  "affected kidneys and calcium level"      Medication List    STOP taking these medications   azithromycin 250 MG tablet Commonly known as:  ZITHROMAX   celecoxib 200 MG capsule Commonly known as:  CELEBREX   losartan 25 MG tablet Commonly known as:  COZAAR     TAKE these medications   acetaminophen 500 MG tablet Commonly known as:  TYLENOL Take 1-2 tablets (500-1,000 mg total) by mouth every 6 (six) hours as needed for pain.   AMBULATORY NON FORMULARY MEDICATION Nebulizer - use as needed - with supplies.  Diagnosis of COPD J44.1   AMBULATORY NON FORMULARY MEDICATION Knee-high, medium compression, graduated compression stockings. Apply  to lower extremities. [email or web address], Zippered Compression Stockings, medium circ, long length   aspirin 81 MG tablet Take 81 mg by mouth daily.   carvedilol 12.5 MG tablet Commonly known as:  COREG Take 1 tablet (12.5 mg total) by mouth 2 (two) times daily with a meal.   cilostazol 100 MG tablet Commonly known as:  PLETAL Take 1 tablet (100 mg total) by mouth 2 (two) times daily.    finasteride 5 MG tablet Commonly known as:  PROSCAR TAKE 1 TABLET BY MOUTH EVERY DAY   Fluticasone-Umeclidin-Vilant 100-62.5-25 MCG/INH Aepb Commonly known as:  TRELEGY ELLIPTA Inhale 1 Inhaler into the lungs daily.   furosemide 40 MG tablet Commonly known as:  LASIX Take 1 tablet (40 mg total) by mouth daily.   guaiFENesin 600 MG 12 hr tablet Commonly known as:  MUCINEX Take 1 tablet (600 mg total) by mouth 2 (two) times daily for 5 days.   HYDROcodone-homatropine 5-1.5 MG/5ML syrup Commonly known as:  HYCODAN Take 5 mLs by mouth at bedtime as needed for cough.   ipratropium-albuterol 0.5-2.5 (3) MG/3ML Soln Commonly known as:  DUONEB Take 3 mLs by nebulization every 2 (two) hours as needed.   multivitamin capsule Take 1 capsule by mouth daily.   Nebulizer Misc Generic nebulizer plus supplies   predniSONE 20 MG tablet Commonly known as:  DELTASONE Take 1-2 tablets (20-40 mg total) by mouth daily with breakfast. Take 40mg  daily for 2days then 20mg  daily for 2days then STOP What changed:    how much to take  additional instructions   rosuvastatin 40 MG tablet Commonly known as:  CRESTOR Take 1 tablet (40 mg total) by mouth daily.   senna-docusate 8.6-50 MG tablet Commonly known as:  Senokot-S Take 1 tablet by mouth at bedtime as needed for mild constipation.   TRUBIOTICS PO Take 1 tablet by mouth daily.   VENTOLIN HFA 108 (90 Base) MCG/ACT inhaler Generic drug:  albuterol TAKE 2 PUFFS BY MOUTH EVERY 6 HOURS AS NEEDED FOR WHEEZE OR SHORTNESS OF BREATH What changed:  See the new instructions.   VITAMIN B-12 PO Take 1 tablet by mouth daily.   vitamin C 1000 MG tablet Take 1,000 mg by mouth daily.   VITAMIN D PO Take 3,000 mg by mouth daily.            Durable Medical Equipment  (From admission, onward)         Start     Ordered   01/18/18 0923  For home use only DME oxygen  Once    Question Answer Comment  Mode or (Route) Nasal cannula    Liters per Minute 2   Frequency Continuous (stationary and portable oxygen unit needed)   Oxygen conserving device Yes   Oxygen delivery system Gas      01/18/18 0922   01/16/18 1645  For home use only DME Walker rolling  Once    Question:  Patient needs a walker to treat with the following condition  Answer:  Weakness   01/16/18 1644         Allergies  Allergen Reactions  . Hydrochlorothiazide Other (See Comments)    Affected renal function.  "affected kidneys and calcium level"   [place] Follow up.   Why:  rolling walker, home oxygen Contact information: [address] [place] [address] [phone]        [facility] Follow up.   Specialty:  Home Health Services Why:  [facility], HHPT Contact information: [address] [place] [address] [phone]            The results of significant diagnostics from this hospitalization (including imaging, microbiology, ancillary and laboratory) are listed below for reference.    Significant Diagnostic Studies: Dg Chest Port 1 View  Result Date: 01/15/2018 CLINICAL DATA:  Shortness of breath EXAM: PORTABLE CHEST 1 VIEW COMPARISON:  01/12/2018 FINDINGS: Prior CABG. Cardiomegaly. Diffuse interstitial and alveolar opacities lik, not significantly changed. Decreasing lung volumes. Possible small effusions. IMPRESSION: Decreasing lung volumes. Continued bilateral airspace opacities which could reflect edema or infection. Suspect small effusions. Electronically Signed   By: Rolm Baptise M.D.   On: 01/15/2018 07:51   Dg Chest Port 1 View  Result Date: 01/12/2018 CLINICAL DATA:  Severe shortness of breath for 4 days. EXAM: PORTABLE CHEST 1 VIEW COMPARISON:  01/11/2018 FINDINGS: Postsurgical changes from CABG. Enlarged cardiac silhouette. Calcific atherosclerotic disease of the aorta. Mediastinal contours appear intact. There is no evidence of  pneumothorax. Probable small left pleural effusion. Bilateral lower thorax reticulonodular airspace consolidation, greater on the left. Osseous structures are without acute abnormality. Soft tissues are grossly normal. IMPRESSION: Multifocal pneumonia worse in the left lower hemithorax. Probable small left pleural effusion. Electronically Signed   By: Fidela Salisbury M.D.   On: 01/12/2018 11:22   Dg Chest Portable 1 View  Result Date: 01/11/2018 CLINICAL DATA:  84 year old male with a history of shortness of breath and chest pain EXAM: PORTABLE CHEST 1 VIEW COMPARISON:  07/04/2017 FINDINGS: Cardiomediastinal silhouette unchanged in size and contour. Surgical changes of median sternotomy and CABG. New reticulonodular opacity of the left lung, predominantly mid lung and lower lung partially obscuring the left hemidiaphragm in the left heart border. No pneumothorax. Lesser degree of reticular opacity at the right lung base. No displaced fracture IMPRESSION: Multifocal pneumonia, greater on the left. Surgical changes of median sternotomy and CABG. Electronically Signed   By: Corrie Mckusick D.O.   On: 01/11/2018 10:33    Microbiology: Recent Results (from the past 240 hour(s))  Culture, blood (routine x 2)     Status: None   Collection Time: 01/11/18 12:01 PM  Result Value Ref Range Status   Specimen Description BLOOD RIGHT HAND  Final   Special Requests   Final    BOTTLES DRAWN AEROBIC AND ANAEROBIC Blood Culture adequate volume   Culture   Final    NO GROWTH 5 DAYS Performed at [facility], [address] [place], [place] [address]    Report Status 01/16/2018 FINAL  Final  Culture, blood (routine x 2)     Status: None   Collection Time: 01/11/18 12:07 PM  Result Value Ref Range Status   Specimen Description BLOOD BLOOD RIGHT HAND  Final   Special Requests   Final    BOTTLES DRAWN AEROBIC AND ANAEROBIC Blood Culture adequate volume   Culture   Final    NO GROWTH 5 DAYS Performed at  [facility], [place] [address], [place], [place] [address]    Report Status 01/16/2018 FINAL  Final  MRSA PCR Screening     Status: None   Collection Time: 01/11/18  5:21 PM  Result Value Ref Range Status   MRSA by PCR NEGATIVE NEGATIVE Final    Comment:        The GeneXpert MRSA Assay (FDA approved for NASAL specimens only), is one component of a comprehensive MRSA colonization surveillance  program. It is not intended to diagnose MRSA infection nor to guide or monitor treatment for MRSA infections. Performed at [facility], [place] [address] [place], [place] [address]   Culture, sputum-assessment     Status: None   Collection Time: 01/13/18  4:12 AM  Result Value Ref Range Status   Specimen Description EXPECTORATED SPUTUM  Final   Special Requests NONE  Final   Sputum evaluation   Final    THIS SPECIMEN IS ACCEPTABLE FOR SPUTUM CULTURE Performed at [facility], [address], [place], [place] [address]    Report Status 01/13/2018 FINAL  Final  Culture, respiratory     Status: None   Collection Time: 01/13/18  4:12 AM  Result Value Ref Range Status   Specimen Description EXPECTORATED SPUTUM  Final   Special Requests NONE Reflexed from [id number]  Final   Gram Stain   Final    ABUNDANT WBC PRESENT,BOTH PMN AND MONONUCLEAR MODERATE GRAM NEGATIVE RODS RARE GRAM POSITIVE COCCI FEW GRAM NEGATIVE COCCI    Culture   Final    FEW Consistent with normal respiratory flora. Performed at [facility], [place] [address], [place], [place] [address]    Report Status 01/15/2018 FINAL  Final     Labs: Basic Metabolic Panel: Recent Labs  Lab 01/15/18 0215 01/16/18 0314 01/17/18 0344 01/17/18 1420 01/18/18 0304  NA 138 140 140 141 142  K 4.5 4.2 4.3 4.0 4.6  CL 101 102 101 100 102  CO2 29 28 32 30 32  GLUCOSE 206* 189* 201* 227* 187*  BUN 56* 50* 42* 41* 43*  CREATININE 1.52* 2.05* 1.21 1.26* 1.35*  CALCIUM 8.7* 8.9 9.1 9.0 9.1  MG 2.8*  --   --   --   --     Liver Function Tests: Recent Labs  Lab 01/13/18 0858  AST 46*  ALT 25  ALKPHOS 45  BILITOT 2.0*  PROT 5.4*  ALBUMIN 2.7*   No results for input(s): LIPASE, AMYLASE in the last 168 hours. No results for input(s): AMMONIA in the last 168 hours. CBC: Recent Labs  Lab 01/13/18 0858 01/14/18 0915 01/15/18 0215 01/16/18 0314 01/17/18 0344 01/18/18 0304  WBC 16.7* 17.3* 15.0* 15.2* 13.7* 13.6*  NEUTROABS 15.0*  --   --   --   --   --   HGB 12.7* 13.5 12.6* 12.9* 12.8* 13.1  HCT 40.9 42.5 39.4 39.7 40.0 41.0  MCV 91.9 91.2 89.7 88.6 89.3 90.1  PLT 201 199 180 185 174 169   Cardiac Enzymes: Recent Labs  Lab 01/11/18 1646 01/11/18 2043 01/12/18 0249  TROPONINI 0.23* 0.23* 0.18*   BNP: BNP (last 3 results) Recent Labs    07/04/17 1440 01/11/18 0935  BNP 94 296.6*    ProBNP (last 3 results) No results for input(s): PROBNP in the last 8760 hours.  CBG: No results for input(s): GLUCAP in the last 168 hours.     Signed:  Domenic Polite MD.  [facility] 01/18/2018, 1:58 PM

## 2018-01-18 NOTE — Progress Notes (Signed)
SATURATION QUALIFICATIONS: (This note is used to comply with regulatory documentation for home oxygen)  Patient Saturations on Room Air at Rest = 88%  Patient Saturations on Room Air while Ambulating = 86%  Patient Saturations on 3 Liters of oxygen while Ambulating = 90%  Please briefly explain why patient needs home oxygen: Patient state he was not he shortness of breath while walking

## 2018-01-22 ENCOUNTER — Telehealth: Payer: Self-pay

## 2018-01-22 DIAGNOSIS — Z7982 Long term (current) use of aspirin: Secondary | ICD-10-CM | POA: Diagnosis not present

## 2018-01-22 DIAGNOSIS — I110 Hypertensive heart disease with heart failure: Secondary | ICD-10-CM | POA: Diagnosis not present

## 2018-01-22 DIAGNOSIS — Z9981 Dependence on supplemental oxygen: Secondary | ICD-10-CM | POA: Diagnosis not present

## 2018-01-22 DIAGNOSIS — Z7952 Long term (current) use of systemic steroids: Secondary | ICD-10-CM | POA: Diagnosis not present

## 2018-01-22 DIAGNOSIS — I2510 Atherosclerotic heart disease of native coronary artery without angina pectoris: Secondary | ICD-10-CM | POA: Diagnosis not present

## 2018-01-22 DIAGNOSIS — Z7951 Long term (current) use of inhaled steroids: Secondary | ICD-10-CM | POA: Diagnosis not present

## 2018-01-22 DIAGNOSIS — J449 Chronic obstructive pulmonary disease, unspecified: Secondary | ICD-10-CM | POA: Diagnosis not present

## 2018-01-22 DIAGNOSIS — I5023 Acute on chronic systolic (congestive) heart failure: Secondary | ICD-10-CM | POA: Diagnosis not present

## 2018-01-22 NOTE — Telephone Encounter (Signed)
I called Joseph Berg and left a message stating he needs to call us back for a hospital follow up with Dr Madilyn Fireman.

## 2018-01-23 ENCOUNTER — Ambulatory Visit (INDEPENDENT_AMBULATORY_CARE_PROVIDER_SITE_OTHER): Payer: Medicare PPO | Admitting: Family Medicine

## 2018-01-23 ENCOUNTER — Encounter: Payer: Self-pay | Admitting: Family Medicine

## 2018-01-23 VITALS — BP 128/62 | HR 93 | Ht 68.0 in | Wt 224.0 lb

## 2018-01-23 DIAGNOSIS — Z7951 Long term (current) use of inhaled steroids: Secondary | ICD-10-CM | POA: Diagnosis not present

## 2018-01-23 DIAGNOSIS — J449 Chronic obstructive pulmonary disease, unspecified: Secondary | ICD-10-CM | POA: Diagnosis not present

## 2018-01-23 DIAGNOSIS — G4733 Obstructive sleep apnea (adult) (pediatric): Secondary | ICD-10-CM

## 2018-01-23 DIAGNOSIS — I110 Hypertensive heart disease with heart failure: Secondary | ICD-10-CM | POA: Diagnosis not present

## 2018-01-23 DIAGNOSIS — J441 Chronic obstructive pulmonary disease with (acute) exacerbation: Secondary | ICD-10-CM

## 2018-01-23 DIAGNOSIS — Z9981 Dependence on supplemental oxygen: Secondary | ICD-10-CM | POA: Diagnosis not present

## 2018-01-23 DIAGNOSIS — J189 Pneumonia, unspecified organism: Secondary | ICD-10-CM | POA: Diagnosis not present

## 2018-01-23 DIAGNOSIS — Z7982 Long term (current) use of aspirin: Secondary | ICD-10-CM | POA: Diagnosis not present

## 2018-01-23 DIAGNOSIS — N179 Acute kidney failure, unspecified: Secondary | ICD-10-CM | POA: Diagnosis not present

## 2018-01-23 DIAGNOSIS — I2510 Atherosclerotic heart disease of native coronary artery without angina pectoris: Secondary | ICD-10-CM | POA: Diagnosis not present

## 2018-01-23 DIAGNOSIS — I5023 Acute on chronic systolic (congestive) heart failure: Secondary | ICD-10-CM | POA: Diagnosis not present

## 2018-01-23 DIAGNOSIS — Z7952 Long term (current) use of systemic steroids: Secondary | ICD-10-CM | POA: Diagnosis not present

## 2018-01-23 MED ORDER — AMBULATORY NON FORMULARY MEDICATION: 0 refills | Status: DC

## 2018-01-23 NOTE — Progress Notes (Signed)
Established Patient Office Visit  Subjective:  Patient ID: Joseph Berg, male    DOB: 02/09/1933  Age: 84 y.o. MRN: [id number]  CC:  Chief Complaint  Patient presents with  . Hospitalization Follow-up    HPI Joseph Berg presents for hospital follow-up.  [name] is an 84 year old male who I had seen a couple of weeks ago for an acute COPD exacerbation.  Unfortunately over the day or 2 afterwards he continued to decline even on antibiotics and prednisone.  He went to the emergency room and was admitted for acute hypoxic respiratory failure he was also diagnosed with multilobular pneumonia and COPD exacerbation.  He was placed on BiPAP he was eventually weaned down to just 2 L of oxygen and was discharged home on oxygen.  He said he wear the oxygen for about the first 2 days that he was home and then has been using it more as needed.  He says his pulse ox at home has been in the low 90s but has been staying above 90 overall. He was also noted to have an elevated troponin level but they felt like it was probably due to demand ischemia and not an MI.  He also was diagnosed with acute on chronic systolic heart failure and was given IV Lasix.  He has been taking 40 mg of Lasix at home but feels like his ankles are still swollen.  He also had acute kidney injury but his creatinine finally stabilized and improved to 1.2 at discharge.  His losartan was held and he is continue to hold that at hom.  Diuretics were resumed.  He does have a diagnosis of sleep apnea but is not currently on CPAP at home.  He tried wearing it about a year ago and finally just gave up he could not get used to it.  But he says he might be willing to try it again.  His wife also let me know that he vomited last night.  He had gone to bed and then woke up around midnight and vomited.  He had a meatloaf for dinner he is felt fine since then.  Past Medical History:  Diagnosis Date  . BPH (benign prostatic  hyperplasia)   . CAD (coronary artery disease)    a. s/p CABG x1 with LIMA-LAD 1994. b. LM & 3v CAD by cath 07/2012  . CHF (congestive heart failure) ([place])    a. EF 35-40% by echo 07/2012.  [name] Complication of anesthesia    affected patients memory. Pt stated "I couldn't remember anything for three months...just bits and pieces"  . Emphysema    a. Moderate emphysema by CT 06/2012.  [name] Heart attack ([place]) 1994  . Hyperlipidemia   . Hypertension   . Hypoparathyroidism ([place])   . NSVT (nonsustained ventricular tachycardia) ([place])    a. Seen on tele 07/2012.  . OSA on CPAP    pt stated he does not wear CPAP because he no longer has sleep apnea  . Pneumonia   . Pseudoaneurysm of left femoral artery ([place])    a. post cath s/p compression 07/2012, associated w/ anemia.  . Sciatica 2012   Qualifier: Diagnosis of  By: Madilyn Fireman MD, Barnetta Chapel      Past Surgical History:  Procedure Laterality Date  . CATARACT EXTRACTION, BILATERAL    . COLONOSCOPY    . CORONARY ARTERY BYPASS GRAFT  03-23-92  . CORONARY ARTERY BYPASS GRAFT N/A 08/14/2012   Procedure: REDO CORONARY ARTERY BYPASS GRAFTING (CABG);  Surgeon: Gaye Pollack, MD;  Location: [facility];  Service: Open Heart Surgery;  Laterality: N/A;  . INTRAOPERATIVE TRANSESOPHAGEAL ECHOCARDIOGRAM N/A 08/14/2012   Procedure: INTRAOPERATIVE TRANSESOPHAGEAL ECHOCARDIOGRAM;  Surgeon: Gaye Pollack, MD;  Location: [facility];  Service: Open Heart Surgery;  Laterality: N/A;  . LUMBAR LAMINECTOMY/DECOMPRESSION MICRODISCECTOMY Left 02/18/2015   Procedure: LUMBAR LAMINECTOMY/DECOMPRESSION MICRODISCECTOMY- LEFT FOUR-FIVE ;  Surgeon: Ashok Pall, MD;  Location: [facility];  Service: Neurosurgery;  Laterality: Left;    Family History  Problem Relation Age of Onset  . Heart attack Father 89  . Stroke Brother 12  . Hypertension Brother   . Hyperlipidemia Brother     Social History   Socioeconomic History  . Marital status: Married    Spouse name: Not on file  . Number of  children: Not on file  . Years of education: Not on file  . Highest education level: Not on file  Occupational History  . Not on file  Social Needs  . Financial resource strain: Not on file  . Food insecurity:    Worry: Not on file    Inability: Not on file  . Transportation needs:    Medical: Not on file    Non-medical: Not on file  Tobacco Use  . Smoking status: Current Some Day Smoker    Packs/day: 1.00    Years: 60.00    Pack years: 60.00    Types: Cigarettes, Pipe  . Smokeless tobacco: Never Used  . Tobacco comment: 01/11/2018 "smokes cigars only now"  Substance and Sexual Activity  . Alcohol use: Yes    Comment: Occasional  . Drug use: No  . Sexual activity: Not on file    Comment: works part-time, [employer], [employer] completed H, married, no children, regular exercise.S  Lifestyle  . Physical activity:    Days per week: Not on file    Minutes per session: Not on file  . Stress: Not on file  Relationships  . Social connections:    Talks on phone: Not on file    Gets together: Not on file    Attends religious service: Not on file    Active member of club or organization: Not on file    Attends meetings of clubs or organizations: Not on file    Relationship status: Not on file  . Intimate partner violence:    Fear of current or ex partner: Not on file    Emotionally abused: Not on file    Physically abused: Not on file    Forced sexual activity: Not on file  Other Topics Concern  . Not on file  Social History Narrative  . Not on file    Outpatient Medications Prior to Visit  Medication Sig Dispense Refill  . acetaminophen (TYLENOL) 500 MG tablet Take 1-2 tablets (500-1,000 mg total) by mouth every 6 (six) hours as needed for pain. 30 tablet 0  . AMBULATORY NON FORMULARY MEDICATION Nebulizer - use as needed - with supplies.  Diagnosis of COPD J44.1 1 each 0  . AMBULATORY NON FORMULARY MEDICATION Knee-high, medium compression, graduated compression  stockings. Apply to lower extremities. [email or web address], Zippered Compression Stockings, medium circ, long length 1 each 0  . Ascorbic Acid (VITAMIN C) 1000 MG tablet Take 1,000 mg by mouth daily.    [name] aspirin 81 MG tablet Take 81 mg by mouth daily.    . carvedilol (COREG) 12.5 MG tablet Take 1 tablet (12.5 mg total) by mouth 2 (two) times daily with a meal.  180 tablet 3  . Cholecalciferol (VITAMIN D PO) Take 3,000 mg by mouth daily.    . cilostazol (PLETAL) 100 MG tablet Take 1 tablet (100 mg total) by mouth 2 (two) times daily. 60 tablet 3  . Cyanocobalamin (VITAMIN B-12 PO) Take 1 tablet by mouth daily.    . finasteride (PROSCAR) 5 MG tablet TAKE 1 TABLET BY MOUTH EVERY DAY (Patient taking differently: Take 5 mg by mouth daily. ) 90 tablet 1  . Fluticasone-Umeclidin-Vilant (TRELEGY ELLIPTA) 100-62.5-25 MCG/INH AEPB Inhale 1 Inhaler into the lungs daily. 3 each 1  . furosemide (LASIX) 40 MG tablet Take 1 tablet (40 mg total) by mouth daily. 90 tablet 3  . guaiFENesin (MUCINEX) 600 MG 12 hr tablet Take 1 tablet (600 mg total) by mouth 2 (two) times daily for 5 days. 10 tablet 0  . HYDROcodone-homatropine (HYCODAN) 5-1.5 MG/5ML syrup Take 5 mLs by mouth at bedtime as needed for cough. 120 mL 0  . ipratropium-albuterol (DUONEB) 0.5-2.5 (3) MG/3ML SOLN Take 3 mLs by nebulization every 2 (two) hours as needed.    . Multiple Vitamin (MULTIVITAMIN) capsule Take 1 capsule by mouth daily.    . Nebulizer MISC Generic nebulizer plus supplies 1 each 0  . predniSONE (DELTASONE) 20 MG tablet Take 1-2 tablets (20-40 mg total) by mouth daily with breakfast. Take 40mg  daily for 2days then 20mg  daily for 2days then STOP 10 tablet 0  . Probiotic Product (TRUBIOTICS PO) Take 1 tablet by mouth daily.    . rosuvastatin (CRESTOR) 40 MG tablet Take 1 tablet (40 mg total) by mouth daily. 30 tablet 5  . senna-docusate (SENOKOT-S) 8.6-50 MG tablet Take 1 tablet by mouth at bedtime as needed for mild constipation. 10  tablet 0  . VENTOLIN HFA 108 (90 Base) MCG/ACT inhaler TAKE 2 PUFFS BY MOUTH EVERY 6 HOURS AS NEEDED FOR WHEEZE OR SHORTNESS OF BREATH (Patient taking differently: Inhale 2 puffs into the lungs every 6 (six) hours as needed. ) 18 Inhaler 2   No facility-administered medications prior to visit.     Allergies  Allergen Reactions  . Hydrochlorothiazide Other (See Comments)    Affected renal function.  "affected kidneys and calcium level"    ROS Review of Systems    Objective:    Physical Exam  BP 128/62   Pulse 93   Ht 5\' 8"  (1.727 m)   Wt 224 lb (101.6 kg)   SpO2 94%   BMI 34.06 kg/m  Wt Readings from Last 3 Encounters:  01/23/18 224 lb (101.6 kg)  01/18/18 232 lb 9.4 oz (105.5 kg)  01/09/18 232 lb (105.2 kg)     Health Maintenance Due  Topic Date Due  . INFLUENZA VACCINE  09/07/2017    There are no preventive care reminders to display for this patient.  Lab Results  Component Value Date   TSH 1.65 11/03/2017   Lab Results  Component Value Date   WBC 13.6 (H) 01/18/2018   HGB 13.1 01/18/2018   HCT 41.0 01/18/2018   MCV 90.1 01/18/2018   PLT 169 01/18/2018   Lab Results  Component Value Date   NA 142 01/18/2018   K 4.6 01/18/2018   CO2 32 01/18/2018   GLUCOSE 187 (H) 01/18/2018   BUN 43 (H) 01/18/2018   CREATININE 1.35 (H) 01/18/2018   BILITOT 2.0 (H) 01/13/2018   ALKPHOS 45 01/13/2018   AST 46 (H) 01/13/2018   ALT 25 01/13/2018   PROT 5.4 (L) 01/13/2018   ALBUMIN  2.7 (L) 01/13/2018   CALCIUM 9.1 01/18/2018   ANIONGAP 8 01/18/2018   Lab Results  Component Value Date   CHOL 104 07/11/2017   Lab Results  Component Value Date   HDL 35 (L) 07/11/2017   Lab Results  Component Value Date   LDLCALC 46 07/11/2017   Lab Results  Component Value Date   TRIG 145 07/11/2017   Lab Results  Component Value Date   CHOLHDL 3.0 07/11/2017   Lab Results  Component Value Date   HGBA1C 5.7 (A) 11/03/2017      Assessment & Plan:   Problem  List Items Addressed This Visit      Respiratory   OSA (obstructive sleep apnea)   Relevant Medications   AMBULATORY NON FORMULARY MEDICATION   COPD exacerbation ([place])   Relevant Orders   DG Chest 2 View     Genitourinary   AKI (acute kidney injury) ([place]) - Primary   Relevant Orders   BASIC METABOLIC PANEL WITH GFR    Other Visit Diagnoses    Pneumonia of both lungs due to infectious organism, unspecified part of lung       Relevant Orders   DG Chest 2 View      COPD-much improved but not quite back to baseline.  Continue to monitor make sure using inhalers regularly particularly his Trelegy.  Acute kidney injury-due to recheck renal function to make sure that it stable it did improve before he was discharged home from the hospital.  Pneumonia-he did complete his antibiotics.  Explained that it can oftentimes take several weeks to completely recover.  Again we will monitor him closely.  Obstructive of sleep apnea-he says he is willing to try the CPAP again.  I will try to find his original study and send if we can send over new order  Meds ordered this encounter  Medications  . AMBULATORY NON FORMULARY MEDICATION    Sig: Medication Name: Study performed at [facility] on 01/10/12. CPAP set to 7 cm water pressure with a mask of choice and heated humidifier. Prefer nasal pillows    Dispense:  1 vial    Refill:  0    Follow-up: Return in about 4 weeks (around 02/20/2018) for recheck lungs.    Beatrice Lecher, MD

## 2018-01-24 ENCOUNTER — Other Ambulatory Visit: Payer: Self-pay | Admitting: *Deleted

## 2018-01-24 DIAGNOSIS — Z7952 Long term (current) use of systemic steroids: Secondary | ICD-10-CM | POA: Diagnosis not present

## 2018-01-24 DIAGNOSIS — J441 Chronic obstructive pulmonary disease with (acute) exacerbation: Secondary | ICD-10-CM | POA: Diagnosis not present

## 2018-01-24 DIAGNOSIS — Z951 Presence of aortocoronary bypass graft: Secondary | ICD-10-CM | POA: Diagnosis not present

## 2018-01-24 DIAGNOSIS — Z7951 Long term (current) use of inhaled steroids: Secondary | ICD-10-CM | POA: Diagnosis not present

## 2018-01-24 DIAGNOSIS — I110 Hypertensive heart disease with heart failure: Secondary | ICD-10-CM | POA: Diagnosis not present

## 2018-01-24 DIAGNOSIS — R0602 Shortness of breath: Secondary | ICD-10-CM | POA: Diagnosis not present

## 2018-01-24 DIAGNOSIS — I5022 Chronic systolic (congestive) heart failure: Secondary | ICD-10-CM | POA: Diagnosis not present

## 2018-01-24 DIAGNOSIS — I2510 Atherosclerotic heart disease of native coronary artery without angina pectoris: Secondary | ICD-10-CM | POA: Diagnosis not present

## 2018-01-24 DIAGNOSIS — J449 Chronic obstructive pulmonary disease, unspecified: Secondary | ICD-10-CM | POA: Diagnosis not present

## 2018-01-24 DIAGNOSIS — E876 Hypokalemia: Secondary | ICD-10-CM

## 2018-01-24 DIAGNOSIS — I5043 Acute on chronic combined systolic (congestive) and diastolic (congestive) heart failure: Secondary | ICD-10-CM | POA: Diagnosis not present

## 2018-01-24 DIAGNOSIS — Z9981 Dependence on supplemental oxygen: Secondary | ICD-10-CM | POA: Diagnosis not present

## 2018-01-24 DIAGNOSIS — I5023 Acute on chronic systolic (congestive) heart failure: Secondary | ICD-10-CM | POA: Diagnosis not present

## 2018-01-24 DIAGNOSIS — Z7982 Long term (current) use of aspirin: Secondary | ICD-10-CM | POA: Diagnosis not present

## 2018-01-24 DIAGNOSIS — I248 Other forms of acute ischemic heart disease: Secondary | ICD-10-CM | POA: Diagnosis not present

## 2018-01-24 DIAGNOSIS — J9601 Acute respiratory failure with hypoxia: Secondary | ICD-10-CM | POA: Diagnosis not present

## 2018-01-24 LAB — BASIC METABOLIC PANEL WITH GFR
BUN/Creatinine Ratio: 20 (calc) (ref 6–22)
BUN: 22 mg/dL (ref 7–25)
CO2: 33 mmol/L — AB (ref 20–32)
Calcium: 9.3 mg/dL (ref 8.6–10.3)
Chloride: 99 mmol/L (ref 98–110)
Creat: 1.12 mg/dL — ABNORMAL HIGH (ref 0.70–1.11)
GFR, Est African American: 71 mL/min/{1.73_m2} (ref >=60)
GFR, Est Non African American: 61 mL/min/{1.73_m2} (ref >=60)
Glucose, Bld: 196 mg/dL — ABNORMAL HIGH (ref 65–99)
Potassium: 3.3 mmol/L — ABNORMAL LOW (ref 3.5–5.3)
Sodium: 140 mmol/L (ref 135–146)

## 2018-01-24 NOTE — Telephone Encounter (Signed)
Patient seen for hospital follow up.

## 2018-01-26 DIAGNOSIS — I2510 Atherosclerotic heart disease of native coronary artery without angina pectoris: Secondary | ICD-10-CM | POA: Diagnosis not present

## 2018-01-26 DIAGNOSIS — I110 Hypertensive heart disease with heart failure: Secondary | ICD-10-CM | POA: Diagnosis not present

## 2018-01-26 DIAGNOSIS — Z7952 Long term (current) use of systemic steroids: Secondary | ICD-10-CM | POA: Diagnosis not present

## 2018-01-26 DIAGNOSIS — Z7951 Long term (current) use of inhaled steroids: Secondary | ICD-10-CM | POA: Diagnosis not present

## 2018-01-26 DIAGNOSIS — I5023 Acute on chronic systolic (congestive) heart failure: Secondary | ICD-10-CM | POA: Diagnosis not present

## 2018-01-26 DIAGNOSIS — J449 Chronic obstructive pulmonary disease, unspecified: Secondary | ICD-10-CM | POA: Diagnosis not present

## 2018-01-26 DIAGNOSIS — Z9981 Dependence on supplemental oxygen: Secondary | ICD-10-CM | POA: Diagnosis not present

## 2018-01-26 DIAGNOSIS — Z7982 Long term (current) use of aspirin: Secondary | ICD-10-CM | POA: Diagnosis not present

## 2018-01-29 DIAGNOSIS — Z9981 Dependence on supplemental oxygen: Secondary | ICD-10-CM | POA: Diagnosis not present

## 2018-01-29 DIAGNOSIS — J449 Chronic obstructive pulmonary disease, unspecified: Secondary | ICD-10-CM | POA: Diagnosis not present

## 2018-01-29 DIAGNOSIS — I110 Hypertensive heart disease with heart failure: Secondary | ICD-10-CM | POA: Diagnosis not present

## 2018-01-29 DIAGNOSIS — I2510 Atherosclerotic heart disease of native coronary artery without angina pectoris: Secondary | ICD-10-CM | POA: Diagnosis not present

## 2018-01-29 DIAGNOSIS — Z7982 Long term (current) use of aspirin: Secondary | ICD-10-CM | POA: Diagnosis not present

## 2018-01-29 DIAGNOSIS — Z7952 Long term (current) use of systemic steroids: Secondary | ICD-10-CM | POA: Diagnosis not present

## 2018-01-29 DIAGNOSIS — Z7951 Long term (current) use of inhaled steroids: Secondary | ICD-10-CM | POA: Diagnosis not present

## 2018-01-29 DIAGNOSIS — I5023 Acute on chronic systolic (congestive) heart failure: Secondary | ICD-10-CM | POA: Diagnosis not present

## 2018-02-01 DIAGNOSIS — Z7951 Long term (current) use of inhaled steroids: Secondary | ICD-10-CM | POA: Diagnosis not present

## 2018-02-01 DIAGNOSIS — I110 Hypertensive heart disease with heart failure: Secondary | ICD-10-CM | POA: Diagnosis not present

## 2018-02-01 DIAGNOSIS — I2510 Atherosclerotic heart disease of native coronary artery without angina pectoris: Secondary | ICD-10-CM | POA: Diagnosis not present

## 2018-02-01 DIAGNOSIS — J449 Chronic obstructive pulmonary disease, unspecified: Secondary | ICD-10-CM | POA: Diagnosis not present

## 2018-02-01 DIAGNOSIS — Z7952 Long term (current) use of systemic steroids: Secondary | ICD-10-CM | POA: Diagnosis not present

## 2018-02-01 DIAGNOSIS — Z7982 Long term (current) use of aspirin: Secondary | ICD-10-CM | POA: Diagnosis not present

## 2018-02-01 DIAGNOSIS — I5023 Acute on chronic systolic (congestive) heart failure: Secondary | ICD-10-CM | POA: Diagnosis not present

## 2018-02-01 DIAGNOSIS — Z9981 Dependence on supplemental oxygen: Secondary | ICD-10-CM | POA: Diagnosis not present

## 2018-02-02 ENCOUNTER — Telehealth: Payer: Self-pay | Admitting: *Deleted

## 2018-02-02 NOTE — Telephone Encounter (Signed)
Joseph Berg's wife called and states he doesn't have a supply company.

## 2018-02-02 NOTE — Telephone Encounter (Addendum)
Order for cpap settings and supplies faxed. Fax failed.Audelia Hives [place], CMA  Called pt and lvm and asked that he rtn call about where to send prescription for cpap and supplies.[name], [place]

## 2018-02-03 DIAGNOSIS — I110 Hypertensive heart disease with heart failure: Secondary | ICD-10-CM | POA: Diagnosis not present

## 2018-02-03 DIAGNOSIS — Z7952 Long term (current) use of systemic steroids: Secondary | ICD-10-CM | POA: Diagnosis not present

## 2018-02-03 DIAGNOSIS — Z7951 Long term (current) use of inhaled steroids: Secondary | ICD-10-CM | POA: Diagnosis not present

## 2018-02-03 DIAGNOSIS — J449 Chronic obstructive pulmonary disease, unspecified: Secondary | ICD-10-CM | POA: Diagnosis not present

## 2018-02-03 DIAGNOSIS — I5023 Acute on chronic systolic (congestive) heart failure: Secondary | ICD-10-CM | POA: Diagnosis not present

## 2018-02-03 DIAGNOSIS — I2510 Atherosclerotic heart disease of native coronary artery without angina pectoris: Secondary | ICD-10-CM | POA: Diagnosis not present

## 2018-02-03 DIAGNOSIS — Z9981 Dependence on supplemental oxygen: Secondary | ICD-10-CM | POA: Diagnosis not present

## 2018-02-03 DIAGNOSIS — Z7982 Long term (current) use of aspirin: Secondary | ICD-10-CM | POA: Diagnosis not present

## 2018-02-05 DIAGNOSIS — J449 Chronic obstructive pulmonary disease, unspecified: Secondary | ICD-10-CM | POA: Diagnosis not present

## 2018-02-05 DIAGNOSIS — Z7952 Long term (current) use of systemic steroids: Secondary | ICD-10-CM | POA: Diagnosis not present

## 2018-02-05 DIAGNOSIS — I110 Hypertensive heart disease with heart failure: Secondary | ICD-10-CM | POA: Diagnosis not present

## 2018-02-05 DIAGNOSIS — I5023 Acute on chronic systolic (congestive) heart failure: Secondary | ICD-10-CM | POA: Diagnosis not present

## 2018-02-05 DIAGNOSIS — I2510 Atherosclerotic heart disease of native coronary artery without angina pectoris: Secondary | ICD-10-CM | POA: Diagnosis not present

## 2018-02-05 DIAGNOSIS — Z7951 Long term (current) use of inhaled steroids: Secondary | ICD-10-CM | POA: Diagnosis not present

## 2018-02-05 DIAGNOSIS — Z7982 Long term (current) use of aspirin: Secondary | ICD-10-CM | POA: Diagnosis not present

## 2018-02-05 DIAGNOSIS — Z9981 Dependence on supplemental oxygen: Secondary | ICD-10-CM | POA: Diagnosis not present

## 2018-02-05 NOTE — Telephone Encounter (Signed)
This phone message was routed to me incorrectly.  Charyl Bigger, CMA

## 2018-02-05 NOTE — Telephone Encounter (Addendum)
Order for CPAP settings and supplies faxed, confirmation received.Joseph Berg, [place]

## 2018-02-08 DIAGNOSIS — Z7982 Long term (current) use of aspirin: Secondary | ICD-10-CM | POA: Diagnosis not present

## 2018-02-08 DIAGNOSIS — I2510 Atherosclerotic heart disease of native coronary artery without angina pectoris: Secondary | ICD-10-CM | POA: Diagnosis not present

## 2018-02-08 DIAGNOSIS — Z7951 Long term (current) use of inhaled steroids: Secondary | ICD-10-CM | POA: Diagnosis not present

## 2018-02-08 DIAGNOSIS — I110 Hypertensive heart disease with heart failure: Secondary | ICD-10-CM | POA: Diagnosis not present

## 2018-02-08 DIAGNOSIS — Z7952 Long term (current) use of systemic steroids: Secondary | ICD-10-CM | POA: Diagnosis not present

## 2018-02-08 DIAGNOSIS — J449 Chronic obstructive pulmonary disease, unspecified: Secondary | ICD-10-CM | POA: Diagnosis not present

## 2018-02-08 DIAGNOSIS — Z9981 Dependence on supplemental oxygen: Secondary | ICD-10-CM | POA: Diagnosis not present

## 2018-02-08 DIAGNOSIS — I5023 Acute on chronic systolic (congestive) heart failure: Secondary | ICD-10-CM | POA: Diagnosis not present

## 2018-02-13 DIAGNOSIS — I5023 Acute on chronic systolic (congestive) heart failure: Secondary | ICD-10-CM | POA: Diagnosis not present

## 2018-02-13 DIAGNOSIS — J449 Chronic obstructive pulmonary disease, unspecified: Secondary | ICD-10-CM | POA: Diagnosis not present

## 2018-02-13 DIAGNOSIS — Z7982 Long term (current) use of aspirin: Secondary | ICD-10-CM | POA: Diagnosis not present

## 2018-02-13 DIAGNOSIS — Z7952 Long term (current) use of systemic steroids: Secondary | ICD-10-CM | POA: Diagnosis not present

## 2018-02-13 DIAGNOSIS — I110 Hypertensive heart disease with heart failure: Secondary | ICD-10-CM | POA: Diagnosis not present

## 2018-02-13 DIAGNOSIS — I2510 Atherosclerotic heart disease of native coronary artery without angina pectoris: Secondary | ICD-10-CM | POA: Diagnosis not present

## 2018-02-13 DIAGNOSIS — Z7951 Long term (current) use of inhaled steroids: Secondary | ICD-10-CM | POA: Diagnosis not present

## 2018-02-13 DIAGNOSIS — Z9981 Dependence on supplemental oxygen: Secondary | ICD-10-CM | POA: Diagnosis not present

## 2018-02-14 ENCOUNTER — Ambulatory Visit (INDEPENDENT_AMBULATORY_CARE_PROVIDER_SITE_OTHER): Payer: Medicare PPO | Admitting: Family Medicine

## 2018-02-14 ENCOUNTER — Encounter: Payer: Self-pay | Admitting: Family Medicine

## 2018-02-14 ENCOUNTER — Ambulatory Visit (INDEPENDENT_AMBULATORY_CARE_PROVIDER_SITE_OTHER): Payer: Medicare PPO

## 2018-02-14 VITALS — BP 145/81 | HR 83 | Wt 217.0 lb

## 2018-02-14 DIAGNOSIS — E876 Hypokalemia: Secondary | ICD-10-CM | POA: Diagnosis not present

## 2018-02-14 DIAGNOSIS — M549 Dorsalgia, unspecified: Secondary | ICD-10-CM

## 2018-02-14 DIAGNOSIS — J441 Chronic obstructive pulmonary disease with (acute) exacerbation: Secondary | ICD-10-CM

## 2018-02-14 DIAGNOSIS — J189 Pneumonia, unspecified organism: Secondary | ICD-10-CM | POA: Diagnosis not present

## 2018-02-14 LAB — BASIC METABOLIC PANEL WITH GFR
BUN: 13 mg/dL (ref 7–25)
CO2: 26 mmol/L (ref 20–32)
Calcium: 9.6 mg/dL (ref 8.6–10.3)
Chloride: 104 mmol/L (ref 98–110)
Creat: 0.91 mg/dL (ref 0.70–1.11)
GFR, Est African American: 91 mL/min/{1.73_m2} (ref >=60)
GFR, Est Non African American: 78 mL/min/{1.73_m2} (ref >=60)
GLUCOSE: 176 mg/dL — AB (ref 65–99)
Potassium: 4 mmol/L (ref 3.5–5.3)
Sodium: 139 mmol/L (ref 135–146)

## 2018-02-14 NOTE — Progress Notes (Signed)
Joseph Berg is a 85 y.o. male who presents to [facility] today for left low back pain.  Joseph Berg developed left low back pain starting yesterday.  He denies any injury.  He denies any pain radiating down his leg or new weakness or numbness.  He does have a history of some left leg weakness likely following lumbar decompression a few years ago.  He notes the weakness is been ongoing for years and is subtle and not particularly worsening.  He denies any new bowel or bladder dysfunction also.  He has tried some over-the-counter medications for pain which have helped a bit.  He notes symptoms are worse with activity and better with rest.  He rates his symptoms as moderate.  His pain does interfere with his ability to get around normally.  He is currently receiving home health physical therapy for his overall difficulty ambulating and fall risk.  His physical therapist recommended that he get medical evaluation today for possible kidney infection.  He notes that he is scheduled to get labs as ordered by his PCP tomorrow to check metabolic function.   ROS as above:  Exam:  BP (!) 145/81   Pulse 83   Wt 217 lb (98.4 kg)   BMI 32.99 kg/m  Wt Readings from Last 5 Encounters:  02/14/18 217 lb (98.4 kg)  01/23/18 224 lb (101.6 kg)  01/18/18 232 lb 9.4 oz (105.5 kg)  01/09/18 232 lb (105.2 kg)  11/03/17 226 lb (102.5 kg)    Gen: Well NAD HEENT: EOMI,  MMM Lungs: Normal work of breathing. CTABL Abd: NABS, Soft. Nondistended, Nontender no CVA angle tenderness to percussion Exts: Brisk capillary refill, warm and well perfused.  L-spine: Nontender to spinal midline.  Tender palpation left lumbar paraspinal musculature. Lumbar motion normal extension.  Some pain with flexion.  Pain with left lateral flexion and rotation.  Normal right. Lower extremity strength is intact  right lower extremity.  Subtle weakness 4+/5 to hip flexion and knee extension otherwise normal throughout. Reflexes are equal normal throughout. Sensation is intact throughout bilateral lower extremities. Mild antalgic gait present.  Lab and Radiology Results EXAM: MRI LUMBAR SPINE WITHOUT CONTRAST  TECHNIQUE: Multiplanar, multisequence MR imaging of the lumbar spine was performed. No intravenous contrast was administered.  COMPARISON:  01/14/2016  FINDINGS: Segmentation:  Normal.  Alignment: Unchanged slight retrolisthesis of L5 on S1. Mild straightening of the normal lumbar lordosis.  Vertebrae: Unchanged prominent L3 superior endplate Schmorl's node. No acute fracture or suspicious osseous lesion.  Conus medullaris and cauda equina: Conus extends to the L1-2 level. Conus and cauda equina appear normal.  Paraspinal and other soft tissues: Partially visualized T2 hyperintense lesions are again noted in both kidneys measuring up to 3 cm in size on the left, likely cysts. Similar appearance of 4.3 cm infrarenal abdominal aortic aneurysm.  Disc levels:  Disc desiccation throughout the lumbar spine. Moderate to severe disc space narrowing at L4-5 with milder narrowing at L2-3 and L3-4.  T11-12: Only imaged sagittally. Mild disc bulging without stenosis, similar to prior.  T12-L1: Tiny central disc extrusion (best seen on sagittal series 2, image 9) without stenosis.  L1-2: Mild facet arthrosis without disc herniation or stenosis.  L2-3: Mild disc bulging and left greater than right facet arthrosis without stenosis, unchanged.  L3-4: Mild disc bulging greater to the left and mild facet hypertrophy without stenosis, not significantly changed.  L4-5: Prior left laminectomy  again noted. Circumferential disc bulging and mild facet hypertrophy result in moderate bilateral neural foraminal stenosis and mild-to-moderate right lateral recess stenosis without  spinal stenosis, stable to minimally progressed.  L5-S1: Circumferential disc bulging and moderate facet arthrosis result in severe left and moderate to severe right neural foraminal stenosis, unchanged. A superimposed central disc protrusion is unchanged without significant lateral recess or spinal stenosis.  IMPRESSION: 1. Stable to minimally progressive disc degeneration at L4-5 with moderate bilateral neural foraminal stenosis and mild-to-moderate right lateral recess stenosis. 2. Unchanged disc and facet degeneration elsewhere including at L5-S1 where there is severe neural foraminal stenosis. 3. Unchanged 4.3 cm infrarenal abdominal aortic aneurysm.   Electronically Signed   By: Logan Bores M.D.   On: 12/18/2017 16:28  I personally (independently) visualized and performed the interpretation of the images attached in this note.     Assessment and Plan: 85 y.o. male with low back pain is very likely due to lumbosacral strain and myofascial disruption and spasm.  Pyelonephritis quite unlikely.  Patient was unable to provide a urine sample during the visit that he has previously just gone to the bathroom.  Additionally we will proceed with work-up with blood labs as ordered by PCP tomorrow and in lab urinalysis with reflex microscopy.  As for the most likely cause of his pain plan to proceed with physical therapy.  He already has existing home on physical therapy and I think we can modify his order to include therapy focused on his back.  Addition recommend heating pad and Tylenol.  Would like to avoid muscle relaxers as this will increase his fall risk and confusion risk.  Recheck if not improving.  PDMP not reviewed this encounter. Orders Placed This Encounter  Procedures  . Urinalysis, Routine w reflex microscopic  . Ambulatory referral to Home Health    Referral Priority:   Routine    Referral Type:   Home Health Care    Referral Reason:   Specialty Services Required      Requested Specialty:   [place]    Number of Visits Requested:   1  . POCT Urinalysis Dipstick   No orders of the defined types were placed in this encounter.    Historical information moved to improve visibility of documentation.  Past Medical History:  Diagnosis Date  . BPH (benign prostatic hyperplasia)   . CAD (coronary artery disease)    a. s/p CABG x1 with LIMA-LAD 1994. b. LM & 3v CAD by cath 07/2012  . CHF (congestive heart failure) ([place])    a. EF 35-40% by echo 07/2012.  [name] Complication of anesthesia    affected patients memory. Pt stated "I couldn't remember anything for three months...just bits and pieces"  . Emphysema    a. Moderate emphysema by CT 06/2012.  [name] Heart attack ([place]) 1994  . Hyperlipidemia   . Hypertension   . Hypoparathyroidism ([place])   . NSVT (nonsustained ventricular tachycardia) ([place])    a. Seen on tele 07/2012.  . OSA on CPAP    pt stated he does not wear CPAP because he no longer has sleep apnea  . Pneumonia   . Pseudoaneurysm of left femoral artery ([place])    a. post cath s/p compression 07/2012, associated w/ anemia.  . Sciatica 2012   Qualifier: Diagnosis of  By: Madilyn Fireman MD, Barnetta Chapel     Past Surgical History:  Procedure Laterality Date  . CATARACT EXTRACTION, BILATERAL    . COLONOSCOPY    .  CORONARY ARTERY BYPASS GRAFT  03-23-92  . CORONARY ARTERY BYPASS GRAFT N/A 08/14/2012   Procedure: REDO CORONARY ARTERY BYPASS GRAFTING (CABG);  Surgeon: Gaye Pollack, MD;  Location: [facility];  Service: Open Heart Surgery;  Laterality: N/A;  . INTRAOPERATIVE TRANSESOPHAGEAL ECHOCARDIOGRAM N/A 08/14/2012   Procedure: INTRAOPERATIVE TRANSESOPHAGEAL ECHOCARDIOGRAM;  Surgeon: Gaye Pollack, MD;  Location: [facility];  Service: Open Heart Surgery;  Laterality: N/A;  . LUMBAR LAMINECTOMY/DECOMPRESSION MICRODISCECTOMY Left 02/18/2015   Procedure: LUMBAR LAMINECTOMY/DECOMPRESSION MICRODISCECTOMY- LEFT FOUR-FIVE ;  Surgeon: Ashok Pall, MD;  Location: [facility];  Service: Neurosurgery;  Laterality: Left;   Social History   Tobacco Use  . Smoking status: Current Some Day Smoker    Packs/day: 1.00    Years: 60.00    Pack years: 60.00    Types: Cigarettes, Pipe  . Smokeless tobacco: Never Used  . Tobacco comment: 01/11/2018 "smokes cigars only now"  Substance Use Topics  . Alcohol use: Yes    Comment: Occasional   family history includes Heart attack (age of onset: 12) in his father; Hyperlipidemia in his brother; Hypertension in his brother; Stroke (age of onset: 8) in his brother.  Medications: Current Outpatient Medications  Medication Sig Dispense Refill  . acetaminophen (TYLENOL) 500 MG tablet Take 1-2 tablets (500-1,000 mg total) by mouth every 6 (six) hours as needed for pain. 30 tablet 0  . AMBULATORY NON FORMULARY MEDICATION Nebulizer - use as needed - with supplies.  Diagnosis of COPD J44.1 1 each 0  . AMBULATORY NON FORMULARY MEDICATION Knee-high, medium compression, graduated compression stockings. Apply to lower extremities. [email or web address], Zippered Compression Stockings, medium circ, long length 1 each 0  . AMBULATORY NON FORMULARY MEDICATION Medication Name: Study performed at [facility] on 01/10/12. CPAP set to 7 cm water pressure with a mask of choice and heated humidifier. Prefer nasal pillows 1 vial 0  . Ascorbic Acid (VITAMIN C) 1000 MG tablet Take 1,000 mg by mouth daily.    [name] aspirin 81 MG tablet Take 81 mg by mouth daily.    . carvedilol (COREG) 12.5 MG tablet Take 1 tablet (12.5 mg total) by mouth 2 (two) times daily with a meal. 180 tablet 3  . Cholecalciferol (VITAMIN D PO) Take 3,000 mg by mouth daily.    . cilostazol (PLETAL) 100 MG tablet Take 1 tablet (100 mg total) by mouth 2 (two) times daily. 60 tablet 3  . Cyanocobalamin (VITAMIN B-12 PO) Take 1 tablet by mouth daily.    . finasteride (PROSCAR) 5 MG tablet TAKE 1 TABLET BY MOUTH EVERY DAY (Patient taking differently: Take 5 mg by mouth  daily. ) 90 tablet 1  . Fluticasone-Umeclidin-Vilant (TRELEGY ELLIPTA) 100-62.5-25 MCG/INH AEPB Inhale 1 Inhaler into the lungs daily. 3 each 1  . furosemide (LASIX) 40 MG tablet Take 1 tablet (40 mg total) by mouth daily. 90 tablet 3  . HYDROcodone-homatropine (HYCODAN) 5-1.5 MG/5ML syrup Take 5 mLs by mouth at bedtime as needed for cough. 120 mL 0  . ipratropium-albuterol (DUONEB) 0.5-2.5 (3) MG/3ML SOLN Take 3 mLs by nebulization every 2 (two) hours as needed.    . Multiple Vitamin (MULTIVITAMIN) capsule Take 1 capsule by mouth daily.    . Nebulizer MISC Generic nebulizer plus supplies 1 each 0  . predniSONE (DELTASONE) 20 MG tablet Take 1-2 tablets (20-40 mg total) by mouth daily with breakfast. Take 40mg  daily for 2days then 20mg  daily for 2days then STOP 10 tablet 0  . Probiotic Product (TRUBIOTICS  PO) Take 1 tablet by mouth daily.    . rosuvastatin (CRESTOR) 40 MG tablet Take 1 tablet (40 mg total) by mouth daily. 30 tablet 5  . senna-docusate (SENOKOT-S) 8.6-50 MG tablet Take 1 tablet by mouth at bedtime as needed for mild constipation. 10 tablet 0  . VENTOLIN HFA 108 (90 Base) MCG/ACT inhaler TAKE 2 PUFFS BY MOUTH EVERY 6 HOURS AS NEEDED FOR WHEEZE OR SHORTNESS OF BREATH (Patient taking differently: Inhale 2 puffs into the lungs every 6 (six) hours as needed. ) 18 Inhaler 2   No current facility-administered medications for this visit.    Allergies  Allergen Reactions  . Hydrochlorothiazide Other (See Comments)    Affected renal function.  "affected kidneys and calcium level"     Discussed warning signs or symptoms. Please see discharge instructions. Patient expresses understanding.

## 2018-02-14 NOTE — Patient Instructions (Addendum)
Thank you for coming in today. Continue home physical therapy.  We will check urine labs when able.   Recheck if not improving.   Use a heating pad for comfort.    Lumbosacral Strain Lumbosacral strain is an injury that causes pain in the lower back (lumbosacral spine). This injury usually occurs from overstretching the muscles or ligaments along your spine. A strain can affect one or more muscles or cord-like tissues that connect bones to other bones (ligaments). What are the causes? This condition may be caused by:  A hard, direct hit (blow) to the back.  Excessive stretching of the lower back muscles. This may result from: ? A fall. ? Lifting something heavy. ? Repetitive movements such as bending or crouching. What increases the risk? The following factors may increase your risk of getting this condition:  Participating in sports or activities that involve: ? A sudden twist of the back. ? Pushing or pulling motions.  Being overweight or obese.  Having poor strength and flexibility, especially tight hamstrings or weak muscles in the back or abdomen.  Having too much of a curve in the lower back.  Having a pelvis that is tilted forward. What are the signs or symptoms? The main symptom of this condition is pain in the lower back, at the site of the strain. Pain may extend (radiate) down one or both legs. How is this diagnosed? This condition is diagnosed based on:  Your symptoms.  Your medical history.  A physical exam. ? Your health care provider may push on certain areas of your back to determine the source of your pain. ? You may be asked to bend forward, backward, and side to side to assess the severity of your pain and your range of motion.  Imaging tests, such as: ? X-rays. ? MRI.  How is this treated? Treatment for this condition may include:  Putting heat and cold on the affected area.  Medicines to help relieve pain and relax your muscles (muscle  relaxants).  NSAIDs to help reduce swelling and discomfort. When your symptoms improve, it is important to gradually return to your normal routine as soon as possible to reduce pain, avoid stiffness, and avoid loss of muscle strength. Generally, symptoms should improve within 6 weeks of treatment. However, recovery time varies. Follow these instructions at home: Managing pain, stiffness, and swelling   If directed, put ice on the injured area during the first 24 hours after your strain. ? Put ice in a plastic bag. ? Place a towel between your skin and the bag. ? Leave the ice on for 20 minutes, 2-3 times a day.  If directed, put heat on the affected area as often as told by your health care provider. Use the heat source that your health care provider recommends, such as a moist heat pack or a heating pad. ? Place a towel between your skin and the heat source. ? Leave the heat on for 20-30 minutes. ? Remove the heat if your skin turns bright red. This is especially important if you are unable to feel pain, heat, or cold. You may have a greater risk of getting burned. Activity  Rest and return to your normal activities as told by your health care provider. Ask your health care provider what activities are safe for you.  Avoid activities that take a lot of energy for as long as told by your health care provider. General instructions  Take over-the-counter and prescription medicines only as told by  your health care provider.  Donot drive or use heavy machinery while taking prescription pain medicine.  Do not use any products that contain nicotine or tobacco, such as cigarettes and e-cigarettes. If you need help quitting, ask your health care provider.  Keep all follow-up visits as told by your health care provider. This is important. How is this prevented?  Use correct form when playing sports and lifting heavy objects.  Use good posture when sitting and standing.  Maintain a  healthy weight.  Sleep on a mattress with medium firmness to support your back.  Be safe and responsible while being active to avoid falls.  Do at least 150 minutes of moderate-intensity exercise each week, such as brisk walking or water aerobics. Try a form of exercise that takes stress off your back, such as swimming or stationary cycling.  Maintain physical fitness, including: ? Strength. ? Flexibility. ? Cardiovascular fitness. ? Endurance. Contact a health care provider if:  Your back pain does not improve after 6 weeks of treatment.  Your symptoms get worse. Get help right away if:  Your back pain is severe.  You cannot stand or walk.  You have difficulty controlling when you urinate or when you have a bowel movement.  You feel nauseous or you vomit.  Your feet get very cold.  You have numbness, tingling, weakness, or problems using your arms or legs.  You develop any of the following: ? Shortness of breath. ? Dizziness. ? Pain in your legs. ? Weakness in your buttocks or legs. ? Discoloration of the skin on your toes or legs. This information is not intended to replace advice given to you by your health care provider. Make sure you discuss any questions you have with your health care provider. Document Released: 11/03/2004 Document Revised: 08/14/2015 Document Reviewed: 06/28/2015 Elsevier Interactive Patient Education  2019 [employer].

## 2018-02-14 NOTE — Progress Notes (Signed)
BACK PAIN

## 2018-02-15 DIAGNOSIS — I2510 Atherosclerotic heart disease of native coronary artery without angina pectoris: Secondary | ICD-10-CM | POA: Diagnosis not present

## 2018-02-15 DIAGNOSIS — Z7951 Long term (current) use of inhaled steroids: Secondary | ICD-10-CM | POA: Diagnosis not present

## 2018-02-15 DIAGNOSIS — Z7982 Long term (current) use of aspirin: Secondary | ICD-10-CM | POA: Diagnosis not present

## 2018-02-15 DIAGNOSIS — I5023 Acute on chronic systolic (congestive) heart failure: Secondary | ICD-10-CM | POA: Diagnosis not present

## 2018-02-15 DIAGNOSIS — Z7952 Long term (current) use of systemic steroids: Secondary | ICD-10-CM | POA: Diagnosis not present

## 2018-02-15 DIAGNOSIS — I110 Hypertensive heart disease with heart failure: Secondary | ICD-10-CM | POA: Diagnosis not present

## 2018-02-15 DIAGNOSIS — Z9981 Dependence on supplemental oxygen: Secondary | ICD-10-CM | POA: Diagnosis not present

## 2018-02-15 DIAGNOSIS — J449 Chronic obstructive pulmonary disease, unspecified: Secondary | ICD-10-CM | POA: Diagnosis not present

## 2018-02-15 LAB — URINALYSIS, ROUTINE W REFLEX MICROSCOPIC
Bacteria, UA: NONE SEEN /HPF
Bilirubin Urine: NEGATIVE
Glucose, UA: NEGATIVE
HGB URINE DIPSTICK: NEGATIVE
Ketones, ur: NEGATIVE
Nitrite: NEGATIVE
Protein, ur: NEGATIVE
Specific Gravity, Urine: 1.023 (ref 1.001–1.03)
pH: <=5 (ref 5.0–8.0)

## 2018-02-20 ENCOUNTER — Ambulatory Visit (INDEPENDENT_AMBULATORY_CARE_PROVIDER_SITE_OTHER): Payer: Medicare PPO | Admitting: Family Medicine

## 2018-02-20 ENCOUNTER — Telehealth: Payer: Self-pay | Admitting: *Deleted

## 2018-02-20 ENCOUNTER — Encounter: Payer: Self-pay | Admitting: Family Medicine

## 2018-02-20 VITALS — BP 123/67 | HR 92 | Ht 68.0 in | Wt 218.0 lb

## 2018-02-20 DIAGNOSIS — J449 Chronic obstructive pulmonary disease, unspecified: Secondary | ICD-10-CM | POA: Diagnosis not present

## 2018-02-20 DIAGNOSIS — I110 Hypertensive heart disease with heart failure: Secondary | ICD-10-CM | POA: Diagnosis not present

## 2018-02-20 DIAGNOSIS — G4733 Obstructive sleep apnea (adult) (pediatric): Secondary | ICD-10-CM

## 2018-02-20 DIAGNOSIS — J189 Pneumonia, unspecified organism: Secondary | ICD-10-CM | POA: Diagnosis not present

## 2018-02-20 DIAGNOSIS — J441 Chronic obstructive pulmonary disease with (acute) exacerbation: Secondary | ICD-10-CM

## 2018-02-20 DIAGNOSIS — Z7982 Long term (current) use of aspirin: Secondary | ICD-10-CM | POA: Diagnosis not present

## 2018-02-20 DIAGNOSIS — Z9981 Dependence on supplemental oxygen: Secondary | ICD-10-CM | POA: Diagnosis not present

## 2018-02-20 DIAGNOSIS — Z7951 Long term (current) use of inhaled steroids: Secondary | ICD-10-CM | POA: Diagnosis not present

## 2018-02-20 DIAGNOSIS — I5023 Acute on chronic systolic (congestive) heart failure: Secondary | ICD-10-CM | POA: Diagnosis not present

## 2018-02-20 DIAGNOSIS — I2510 Atherosclerotic heart disease of native coronary artery without angina pectoris: Secondary | ICD-10-CM | POA: Diagnosis not present

## 2018-02-20 DIAGNOSIS — Z7952 Long term (current) use of systemic steroids: Secondary | ICD-10-CM | POA: Diagnosis not present

## 2018-02-20 NOTE — Progress Notes (Signed)
Established Patient Office Visit  Subjective:  Patient ID: [name], male    DOB: 08-24-1933  Age: 85 y.o. MRN: [id number]  CC:  Chief Complaint  Patient presents with  . Follow-up    HPI Joseph Berg presents for recheck of his lungs.  I saw him about 4 weeks ago for hospital follow-up for COPD exacerbation and multilobular pneumonia.  He was also diagnosed with acute on chronic systolic heart failure and given IV Lasix.  He did have renal injury as well.  And asked him to follow-up in a couple weeks to recheck his lungs to make sure that he was continuing to improve.  He is currently on Trelegy daily for his COPD.  Did go for repeat chest x-ray on January 8 and it did show improving bilateral airspace opacities but not complete resolution. He is feeling some better.    Follow-up obstructive sleep apnea-we did fax over orders for new CPAP settings and supplies right after the holidays.  He was willing to retry his CPAP again for his sleep apnea.  Past Medical History:  Diagnosis Date  . BPH (benign prostatic hyperplasia)   . CAD (coronary artery disease)    a. s/p CABG x1 with LIMA-LAD 1994. b. LM & 3v CAD by cath 07/2012  . CHF (congestive heart failure) ([place])    a. EF 35-40% by echo 07/2012.  [name] Complication of anesthesia    affected patients memory. Pt stated "I couldn't remember anything for three months...just bits and pieces"  . Emphysema    a. Moderate emphysema by CT 06/2012.  [name] Heart attack ([place]) 1994  . Hyperlipidemia   . Hypertension   . Hypoparathyroidism ([facility])   . NSVT (nonsustained ventricular tachycardia) ([place])    a. Seen on tele 07/2012.  . OSA on CPAP    pt stated he does not wear CPAP because he no longer has sleep apnea  . Pneumonia   . Pseudoaneurysm of left femoral artery ([place])    a. post cath s/p compression 07/2012, associated w/ anemia.  . Sciatica 2012   Qualifier: Diagnosis of  By: Madilyn Fireman MD, Barnetta Chapel      Past Surgical History:   Procedure Laterality Date  . CATARACT EXTRACTION, BILATERAL    . COLONOSCOPY    . CORONARY ARTERY BYPASS GRAFT  03-23-92  . CORONARY ARTERY BYPASS GRAFT N/A 08/14/2012   Procedure: REDO CORONARY ARTERY BYPASS GRAFTING (CABG);  Surgeon: Gaye Pollack, MD;  Location: [facility];  Service: Open Heart Surgery;  Laterality: N/A;  . INTRAOPERATIVE TRANSESOPHAGEAL ECHOCARDIOGRAM N/A 08/14/2012   Procedure: INTRAOPERATIVE TRANSESOPHAGEAL ECHOCARDIOGRAM;  Surgeon: Gaye Pollack, MD;  Location: [facility];  Service: Open Heart Surgery;  Laterality: N/A;  . LUMBAR LAMINECTOMY/DECOMPRESSION MICRODISCECTOMY Left 02/18/2015   Procedure: LUMBAR LAMINECTOMY/DECOMPRESSION MICRODISCECTOMY- LEFT FOUR-FIVE ;  Surgeon: Ashok Pall, MD;  Location: [facility];  Service: Neurosurgery;  Laterality: Left;    Family History  Problem Relation Age of Onset  . Heart attack Father 61  . Stroke Brother 68  . Hypertension Brother   . Hyperlipidemia Brother     Social History   Socioeconomic History  . Marital status: Married    Spouse name: Not on file  . Number of children: Not on file  . Years of education: Not on file  . Highest education level: Not on file  Occupational History  . Not on file  Social Needs  . Financial resource strain: Not on file  . Food insecurity:  Worry: Not on file    Inability: Not on file  . Transportation needs:    Medical: Not on file    Non-medical: Not on file  Tobacco Use  . Smoking status: Current Some Day Smoker    Packs/day: 1.00    Years: 60.00    Pack years: 60.00    Types: Cigarettes, Pipe  . Smokeless tobacco: Never Used  . Tobacco comment: 01/11/2018 "smokes cigars only now"  Substance and Sexual Activity  . Alcohol use: Yes    Comment: Occasional  . Drug use: No  . Sexual activity: Not on file    Comment: works part-time, [employer], [employer] completed H, married, no children, regular exercise.S  Lifestyle  . Physical activity:    Days per week: Not on file     Minutes per session: Not on file  . Stress: Not on file  Relationships  . Social connections:    Talks on phone: Not on file    Gets together: Not on file    Attends religious service: Not on file    Active member of club or organization: Not on file    Attends meetings of clubs or organizations: Not on file    Relationship status: Not on file  . Intimate partner violence:    Fear of current or ex partner: Not on file    Emotionally abused: Not on file    Physically abused: Not on file    Forced sexual activity: Not on file  Other Topics Concern  . Not on file  Social History Narrative  . Not on file    Outpatient Medications Prior to Visit  Medication Sig Dispense Refill  . acetaminophen (TYLENOL) 500 MG tablet Take 1-2 tablets (500-1,000 mg total) by mouth every 6 (six) hours as needed for pain. 30 tablet 0  . AMBULATORY NON FORMULARY MEDICATION Nebulizer - use as needed - with supplies.  Diagnosis of COPD J44.1 1 each 0  . AMBULATORY NON FORMULARY MEDICATION Knee-high, medium compression, graduated compression stockings. Apply to lower extremities. [email or web address], Zippered Compression Stockings, medium circ, long length 1 each 0  . Ascorbic Acid (VITAMIN C) 1000 MG tablet Take 1,000 mg by mouth daily.    [name] aspirin 81 MG tablet Take 81 mg by mouth daily.    . carvedilol (COREG) 12.5 MG tablet Take 1 tablet (12.5 mg total) by mouth 2 (two) times daily with a meal. 180 tablet 3  . Cholecalciferol (VITAMIN D PO) Take 3,000 mg by mouth daily.    . cilostazol (PLETAL) 100 MG tablet Take 1 tablet (100 mg total) by mouth 2 (two) times daily. 60 tablet 3  . Cyanocobalamin (VITAMIN B-12 PO) Take 1 tablet by mouth daily.    . finasteride (PROSCAR) 5 MG tablet TAKE 1 TABLET BY MOUTH EVERY DAY (Patient taking differently: Take 5 mg by mouth daily. ) 90 tablet 1  . Fluticasone-Umeclidin-Vilant (TRELEGY ELLIPTA) 100-62.5-25 MCG/INH AEPB Inhale 1 Inhaler into the lungs daily. 3 each 1  .  furosemide (LASIX) 40 MG tablet Take 1 tablet (40 mg total) by mouth daily. 90 tablet 3  . ipratropium-albuterol (DUONEB) 0.5-2.5 (3) MG/3ML SOLN Take 3 mLs by nebulization every 2 (two) hours as needed.    . Multiple Vitamin (MULTIVITAMIN) capsule Take 1 capsule by mouth daily.    . Nebulizer MISC Generic nebulizer plus supplies 1 each 0  . Probiotic Product (TRUBIOTICS PO) Take 1 tablet by mouth daily.    . rosuvastatin (CRESTOR) 40 MG tablet  Take 1 tablet (40 mg total) by mouth daily. 30 tablet 5  . senna-docusate (SENOKOT-S) 8.6-50 MG tablet Take 1 tablet by mouth at bedtime as needed for mild constipation. 10 tablet 0  . VENTOLIN HFA 108 (90 Base) MCG/ACT inhaler TAKE 2 PUFFS BY MOUTH EVERY 6 HOURS AS NEEDED FOR WHEEZE OR SHORTNESS OF BREATH (Patient taking differently: Inhale 2 puffs into the lungs every 6 (six) hours as needed. ) 18 Inhaler 2  . AMBULATORY NON FORMULARY MEDICATION Medication Name: Study performed at [facility] on 01/10/12. CPAP set to 7 cm water pressure with a mask of choice and heated humidifier. Prefer nasal pillows 1 vial 0  . HYDROcodone-homatropine (HYCODAN) 5-1.5 MG/5ML syrup Take 5 mLs by mouth at bedtime as needed for cough. 120 mL 0  . predniSONE (DELTASONE) 20 MG tablet Take 1-2 tablets (20-40 mg total) by mouth daily with breakfast. Take 40mg  daily for 2days then 20mg  daily for 2days then STOP 10 tablet 0   No facility-administered medications prior to visit.     Allergies  Allergen Reactions  . Hydrochlorothiazide Other (See Comments)    Affected renal function.  "affected kidneys and calcium level"    ROS Review of Systems    Objective:    Physical Exam  Constitutional: He is oriented to person, place, and time. He appears well-developed and well-nourished.  HENT:  Head: Normocephalic and atraumatic.  Cardiovascular: Normal rate, regular rhythm and normal heart sounds.  Pulmonary/Chest: Effort normal and breath sounds normal.   Neurological: He is alert and oriented to person, place, and time.  Skin: Skin is warm and dry.  Psychiatric: He has a normal mood and affect. His behavior is normal.    BP 123/67   Pulse 92   Ht 5\' 8"  (1.727 m)   Wt 218 lb (98.9 kg)   SpO2 93%   BMI 33.15 kg/m  Wt Readings from Last 3 Encounters:  02/20/18 218 lb (98.9 kg)  02/14/18 217 lb (98.4 kg)  01/23/18 224 lb (101.6 kg)     There are no preventive care reminders to display for this patient.  There are no preventive care reminders to display for this patient.  Lab Results  Component Value Date   TSH 1.65 11/03/2017   Lab Results  Component Value Date   WBC 13.6 (H) 01/18/2018   HGB 13.1 01/18/2018   HCT 41.0 01/18/2018   MCV 90.1 01/18/2018   PLT 169 01/18/2018   Lab Results  Component Value Date   NA 139 02/14/2018   K 4.0 02/14/2018   CO2 26 02/14/2018   GLUCOSE 176 (H) 02/14/2018   BUN 13 02/14/2018   CREATININE 0.91 02/14/2018   BILITOT 2.0 (H) 01/13/2018   ALKPHOS 45 01/13/2018   AST 46 (H) 01/13/2018   ALT 25 01/13/2018   PROT 5.4 (L) 01/13/2018   ALBUMIN 2.7 (L) 01/13/2018   CALCIUM 9.6 02/14/2018   ANIONGAP 8 01/18/2018   Lab Results  Component Value Date   CHOL 104 07/11/2017   Lab Results  Component Value Date   HDL 35 (L) 07/11/2017   Lab Results  Component Value Date   LDLCALC 46 07/11/2017   Lab Results  Component Value Date   TRIG 145 07/11/2017   Lab Results  Component Value Date   CHOLHDL 3.0 07/11/2017   Lab Results  Component Value Date   HGBA1C 5.7 (A) 11/03/2017     Assessment & Plan:   Problem List Items Addressed This Visit  Respiratory   OSA (obstructive sleep apnea) - Primary   Multifocal pneumonia   COPD exacerbation (HCC)     OSA - he says her never got a call for CPAP supplies.   Pneumonia/COPD - stable. He feels like he is back to baseline.    No orders of the defined types were placed in this encounter.   Follow-up: No follow-ups on  file.     Beatrice Lecher, MD

## 2018-02-20 NOTE — Telephone Encounter (Signed)
2nd attempt for cpap orders faxed to [facility] for pt to get cpap supplies/settings done.[name], CMA Confirmation received.Maryruth Eve, [place] Crocker, CMA

## 2018-02-21 DIAGNOSIS — I2510 Atherosclerotic heart disease of native coronary artery without angina pectoris: Secondary | ICD-10-CM | POA: Diagnosis not present

## 2018-02-21 DIAGNOSIS — Z7951 Long term (current) use of inhaled steroids: Secondary | ICD-10-CM | POA: Diagnosis not present

## 2018-02-21 DIAGNOSIS — Z7952 Long term (current) use of systemic steroids: Secondary | ICD-10-CM | POA: Diagnosis not present

## 2018-02-21 DIAGNOSIS — I110 Hypertensive heart disease with heart failure: Secondary | ICD-10-CM | POA: Diagnosis not present

## 2018-02-21 DIAGNOSIS — J449 Chronic obstructive pulmonary disease, unspecified: Secondary | ICD-10-CM | POA: Diagnosis not present

## 2018-02-21 DIAGNOSIS — Z9981 Dependence on supplemental oxygen: Secondary | ICD-10-CM | POA: Diagnosis not present

## 2018-02-21 DIAGNOSIS — I5023 Acute on chronic systolic (congestive) heart failure: Secondary | ICD-10-CM | POA: Diagnosis not present

## 2018-02-21 DIAGNOSIS — Z7982 Long term (current) use of aspirin: Secondary | ICD-10-CM | POA: Diagnosis not present

## 2018-02-22 ENCOUNTER — Telehealth: Payer: Self-pay

## 2018-02-22 NOTE — Telephone Encounter (Signed)
Joseph Berg, his wife, called and states Joseph Berg no longer needs oxygen. His O2 is running 92-95% at rest and with activity. She wants [place] to call [facility] to D/C the oxygen order.   [phone]   [name] wife also asked about an order for a CPAP. They still haven't heard anything.

## 2018-02-23 DIAGNOSIS — Z7952 Long term (current) use of systemic steroids: Secondary | ICD-10-CM | POA: Diagnosis not present

## 2018-02-23 DIAGNOSIS — Z7951 Long term (current) use of inhaled steroids: Secondary | ICD-10-CM | POA: Diagnosis not present

## 2018-02-23 DIAGNOSIS — I2510 Atherosclerotic heart disease of native coronary artery without angina pectoris: Secondary | ICD-10-CM | POA: Diagnosis not present

## 2018-02-23 DIAGNOSIS — I110 Hypertensive heart disease with heart failure: Secondary | ICD-10-CM | POA: Diagnosis not present

## 2018-02-23 DIAGNOSIS — Z9981 Dependence on supplemental oxygen: Secondary | ICD-10-CM | POA: Diagnosis not present

## 2018-02-23 DIAGNOSIS — J449 Chronic obstructive pulmonary disease, unspecified: Secondary | ICD-10-CM | POA: Diagnosis not present

## 2018-02-23 DIAGNOSIS — Z7982 Long term (current) use of aspirin: Secondary | ICD-10-CM | POA: Diagnosis not present

## 2018-02-23 DIAGNOSIS — I5023 Acute on chronic systolic (congestive) heart failure: Secondary | ICD-10-CM | POA: Diagnosis not present

## 2018-02-23 MED ORDER — AMBULATORY NON FORMULARY MEDICATION: 0 refills | Status: DC

## 2018-02-23 NOTE — Telephone Encounter (Signed)
Spoke w/pt and informed him that Joseph Berg ([facility]) would be contacting him about his cpap today. And also told him that we would place an order to have his O2 discountinued.[name], [place]

## 2018-02-23 NOTE — Telephone Encounter (Signed)
Spoke with Luna Fuse she said that [facility] is going to be calling pt today to get some additional information.Marland KitchenMarland KitchenElouise Munroe, [place]

## 2018-02-23 NOTE — Telephone Encounter (Signed)
Order faxed to [facility] confirmation received .Marland KitchenAudelia Hives [place]

## 2018-02-24 DIAGNOSIS — Z951 Presence of aortocoronary bypass graft: Secondary | ICD-10-CM | POA: Diagnosis not present

## 2018-02-24 DIAGNOSIS — J441 Chronic obstructive pulmonary disease with (acute) exacerbation: Secondary | ICD-10-CM | POA: Diagnosis not present

## 2018-02-24 DIAGNOSIS — I5022 Chronic systolic (congestive) heart failure: Secondary | ICD-10-CM | POA: Diagnosis not present

## 2018-02-24 DIAGNOSIS — I248 Other forms of acute ischemic heart disease: Secondary | ICD-10-CM | POA: Diagnosis not present

## 2018-02-24 DIAGNOSIS — J9601 Acute respiratory failure with hypoxia: Secondary | ICD-10-CM | POA: Diagnosis not present

## 2018-02-24 DIAGNOSIS — R0602 Shortness of breath: Secondary | ICD-10-CM | POA: Diagnosis not present

## 2018-02-24 DIAGNOSIS — I5043 Acute on chronic combined systolic (congestive) and diastolic (congestive) heart failure: Secondary | ICD-10-CM | POA: Diagnosis not present

## 2018-02-26 DIAGNOSIS — Z7951 Long term (current) use of inhaled steroids: Secondary | ICD-10-CM | POA: Diagnosis not present

## 2018-02-26 DIAGNOSIS — I2510 Atherosclerotic heart disease of native coronary artery without angina pectoris: Secondary | ICD-10-CM | POA: Diagnosis not present

## 2018-02-26 DIAGNOSIS — I5023 Acute on chronic systolic (congestive) heart failure: Secondary | ICD-10-CM | POA: Diagnosis not present

## 2018-02-26 DIAGNOSIS — Z9981 Dependence on supplemental oxygen: Secondary | ICD-10-CM | POA: Diagnosis not present

## 2018-02-26 DIAGNOSIS — Z7952 Long term (current) use of systemic steroids: Secondary | ICD-10-CM | POA: Diagnosis not present

## 2018-02-26 DIAGNOSIS — Z7982 Long term (current) use of aspirin: Secondary | ICD-10-CM | POA: Diagnosis not present

## 2018-02-26 DIAGNOSIS — J449 Chronic obstructive pulmonary disease, unspecified: Secondary | ICD-10-CM | POA: Diagnosis not present

## 2018-02-26 DIAGNOSIS — I110 Hypertensive heart disease with heart failure: Secondary | ICD-10-CM | POA: Diagnosis not present

## 2018-03-01 DIAGNOSIS — I110 Hypertensive heart disease with heart failure: Secondary | ICD-10-CM | POA: Diagnosis not present

## 2018-03-01 DIAGNOSIS — I2510 Atherosclerotic heart disease of native coronary artery without angina pectoris: Secondary | ICD-10-CM | POA: Diagnosis not present

## 2018-03-01 DIAGNOSIS — J449 Chronic obstructive pulmonary disease, unspecified: Secondary | ICD-10-CM | POA: Diagnosis not present

## 2018-03-01 DIAGNOSIS — Z7982 Long term (current) use of aspirin: Secondary | ICD-10-CM | POA: Diagnosis not present

## 2018-03-01 DIAGNOSIS — Z7952 Long term (current) use of systemic steroids: Secondary | ICD-10-CM | POA: Diagnosis not present

## 2018-03-01 DIAGNOSIS — Z7951 Long term (current) use of inhaled steroids: Secondary | ICD-10-CM | POA: Diagnosis not present

## 2018-03-01 DIAGNOSIS — I5023 Acute on chronic systolic (congestive) heart failure: Secondary | ICD-10-CM | POA: Diagnosis not present

## 2018-03-01 DIAGNOSIS — Z9981 Dependence on supplemental oxygen: Secondary | ICD-10-CM | POA: Diagnosis not present

## 2018-03-02 DIAGNOSIS — G4733 Obstructive sleep apnea (adult) (pediatric): Secondary | ICD-10-CM | POA: Diagnosis not present

## 2018-03-02 DIAGNOSIS — Z9981 Dependence on supplemental oxygen: Secondary | ICD-10-CM | POA: Diagnosis not present

## 2018-03-02 DIAGNOSIS — Z7952 Long term (current) use of systemic steroids: Secondary | ICD-10-CM | POA: Diagnosis not present

## 2018-03-02 DIAGNOSIS — Z7951 Long term (current) use of inhaled steroids: Secondary | ICD-10-CM | POA: Diagnosis not present

## 2018-03-02 DIAGNOSIS — I2510 Atherosclerotic heart disease of native coronary artery without angina pectoris: Secondary | ICD-10-CM | POA: Diagnosis not present

## 2018-03-02 DIAGNOSIS — Z7982 Long term (current) use of aspirin: Secondary | ICD-10-CM | POA: Diagnosis not present

## 2018-03-02 DIAGNOSIS — I5023 Acute on chronic systolic (congestive) heart failure: Secondary | ICD-10-CM | POA: Diagnosis not present

## 2018-03-02 DIAGNOSIS — I110 Hypertensive heart disease with heart failure: Secondary | ICD-10-CM | POA: Diagnosis not present

## 2018-03-02 DIAGNOSIS — J449 Chronic obstructive pulmonary disease, unspecified: Secondary | ICD-10-CM | POA: Diagnosis not present

## 2018-03-05 ENCOUNTER — Other Ambulatory Visit: Payer: Self-pay

## 2018-03-05 ENCOUNTER — Telehealth: Payer: Self-pay

## 2018-03-05 ENCOUNTER — Encounter (HOSPITAL_COMMUNITY): Payer: Self-pay | Admitting: Emergency Medicine

## 2018-03-05 ENCOUNTER — Encounter: Payer: Self-pay | Admitting: Emergency Medicine

## 2018-03-05 ENCOUNTER — Emergency Department (INDEPENDENT_AMBULATORY_CARE_PROVIDER_SITE_OTHER): Payer: Medicare PPO

## 2018-03-05 ENCOUNTER — Inpatient Hospital Stay (HOSPITAL_COMMUNITY)
Admission: EM | Admit: 2018-03-05 | Discharge: 2018-03-21 | DRG: 193 | Disposition: A | Discharge status: Skilled Nursing Facility | Payer: Medicare PPO | Attending: Internal Medicine | Admitting: Internal Medicine

## 2018-03-05 ENCOUNTER — Emergency Department (HOSPITAL_COMMUNITY): Payer: Medicare PPO

## 2018-03-05 ENCOUNTER — Emergency Department (INDEPENDENT_AMBULATORY_CARE_PROVIDER_SITE_OTHER)
Admission: EM | Admit: 2018-03-05 | Discharge: 2018-03-05 | Disposition: A | Discharge status: Home / Self Care | Payer: Medicare PPO | Source: Home / Self Care | Attending: Emergency Medicine | Admitting: Emergency Medicine

## 2018-03-05 DIAGNOSIS — Z452 Encounter for adjustment and management of vascular access device: Secondary | ICD-10-CM | POA: Diagnosis not present

## 2018-03-05 DIAGNOSIS — Z9989 Dependence on other enabling machines and devices: Secondary | ICD-10-CM | POA: Diagnosis not present

## 2018-03-05 DIAGNOSIS — I5023 Acute on chronic systolic (congestive) heart failure: Secondary | ICD-10-CM | POA: Diagnosis not present

## 2018-03-05 DIAGNOSIS — M2550 Pain in unspecified joint: Secondary | ICD-10-CM | POA: Diagnosis not present

## 2018-03-05 DIAGNOSIS — T451X5A Adverse effect of antineoplastic and immunosuppressive drugs, initial encounter: Secondary | ICD-10-CM | POA: Diagnosis not present

## 2018-03-05 DIAGNOSIS — Z8701 Personal history of pneumonia (recurrent): Secondary | ICD-10-CM

## 2018-03-05 DIAGNOSIS — I25810 Atherosclerosis of coronary artery bypass graft(s) without angina pectoris: Secondary | ICD-10-CM | POA: Diagnosis not present

## 2018-03-05 DIAGNOSIS — R0902 Hypoxemia: Secondary | ICD-10-CM

## 2018-03-05 DIAGNOSIS — D61818 Other pancytopenia: Secondary | ICD-10-CM | POA: Diagnosis present

## 2018-03-05 DIAGNOSIS — R748 Abnormal levels of other serum enzymes: Secondary | ICD-10-CM | POA: Diagnosis not present

## 2018-03-05 DIAGNOSIS — Z7902 Long term (current) use of antithrombotics/antiplatelets: Secondary | ICD-10-CM

## 2018-03-05 DIAGNOSIS — Z823 Family history of stroke: Secondary | ICD-10-CM

## 2018-03-05 DIAGNOSIS — J449 Chronic obstructive pulmonary disease, unspecified: Secondary | ICD-10-CM

## 2018-03-05 DIAGNOSIS — E785 Hyperlipidemia, unspecified: Secondary | ICD-10-CM | POA: Diagnosis present

## 2018-03-05 DIAGNOSIS — R0602 Shortness of breath: Secondary | ICD-10-CM | POA: Diagnosis not present

## 2018-03-05 DIAGNOSIS — F17210 Nicotine dependence, cigarettes, uncomplicated: Secondary | ICD-10-CM | POA: Diagnosis present

## 2018-03-05 DIAGNOSIS — Z8349 Family history of other endocrine, nutritional and metabolic diseases: Secondary | ICD-10-CM

## 2018-03-05 DIAGNOSIS — I5022 Chronic systolic (congestive) heart failure: Secondary | ICD-10-CM | POA: Diagnosis present

## 2018-03-05 DIAGNOSIS — I714 Abdominal aortic aneurysm, without rupture, unspecified: Secondary | ICD-10-CM | POA: Diagnosis present

## 2018-03-05 DIAGNOSIS — C801 Malignant (primary) neoplasm, unspecified: Secondary | ICD-10-CM

## 2018-03-05 DIAGNOSIS — J181 Lobar pneumonia, unspecified organism: Secondary | ICD-10-CM | POA: Diagnosis not present

## 2018-03-05 DIAGNOSIS — D696 Thrombocytopenia, unspecified: Secondary | ICD-10-CM | POA: Diagnosis not present

## 2018-03-05 DIAGNOSIS — Z888 Allergy status to other drugs, medicaments and biological substances status: Secondary | ICD-10-CM | POA: Diagnosis not present

## 2018-03-05 DIAGNOSIS — N289 Disorder of kidney and ureter, unspecified: Secondary | ICD-10-CM

## 2018-03-05 DIAGNOSIS — J440 Chronic obstructive pulmonary disease with acute lower respiratory infection: Secondary | ICD-10-CM | POA: Diagnosis present

## 2018-03-05 DIAGNOSIS — D6481 Anemia due to antineoplastic chemotherapy: Secondary | ICD-10-CM | POA: Diagnosis not present

## 2018-03-05 DIAGNOSIS — N2889 Other specified disorders of kidney and ureter: Secondary | ICD-10-CM | POA: Diagnosis not present

## 2018-03-05 DIAGNOSIS — Z8249 Family history of ischemic heart disease and other diseases of the circulatory system: Secondary | ICD-10-CM

## 2018-03-05 DIAGNOSIS — I130 Hypertensive heart and chronic kidney disease with heart failure and stage 1 through stage 4 chronic kidney disease, or unspecified chronic kidney disease: Secondary | ICD-10-CM | POA: Diagnosis present

## 2018-03-05 DIAGNOSIS — R0609 Other forms of dyspnea: Secondary | ICD-10-CM | POA: Diagnosis not present

## 2018-03-05 DIAGNOSIS — Z7952 Long term (current) use of systemic steroids: Secondary | ICD-10-CM | POA: Diagnosis not present

## 2018-03-05 DIAGNOSIS — J9601 Acute respiratory failure with hypoxia: Secondary | ICD-10-CM | POA: Diagnosis present

## 2018-03-05 DIAGNOSIS — Z7982 Long term (current) use of aspirin: Secondary | ICD-10-CM | POA: Diagnosis not present

## 2018-03-05 DIAGNOSIS — K5792 Diverticulitis of intestine, part unspecified, without perforation or abscess without bleeding: Secondary | ICD-10-CM | POA: Diagnosis present

## 2018-03-05 DIAGNOSIS — J189 Pneumonia, unspecified organism: Secondary | ICD-10-CM | POA: Diagnosis present

## 2018-03-05 DIAGNOSIS — G9389 Other specified disorders of brain: Secondary | ICD-10-CM | POA: Diagnosis not present

## 2018-03-05 DIAGNOSIS — N183 Chronic kidney disease, stage 3 (moderate): Secondary | ICD-10-CM | POA: Diagnosis present

## 2018-03-05 DIAGNOSIS — N400 Enlarged prostate without lower urinary tract symptoms: Secondary | ICD-10-CM | POA: Diagnosis not present

## 2018-03-05 DIAGNOSIS — E209 Hypoparathyroidism, unspecified: Secondary | ICD-10-CM | POA: Diagnosis present

## 2018-03-05 DIAGNOSIS — I2510 Atherosclerotic heart disease of native coronary artery without angina pectoris: Secondary | ICD-10-CM | POA: Diagnosis not present

## 2018-03-05 DIAGNOSIS — D649 Anemia, unspecified: Secondary | ICD-10-CM | POA: Diagnosis not present

## 2018-03-05 DIAGNOSIS — J9621 Acute and chronic respiratory failure with hypoxia: Secondary | ICD-10-CM | POA: Diagnosis not present

## 2018-03-05 DIAGNOSIS — Z7401 Bed confinement status: Secondary | ICD-10-CM | POA: Diagnosis not present

## 2018-03-05 DIAGNOSIS — Z9981 Dependence on supplemental oxygen: Secondary | ICD-10-CM | POA: Diagnosis not present

## 2018-03-05 DIAGNOSIS — I252 Old myocardial infarction: Secondary | ICD-10-CM

## 2018-03-05 DIAGNOSIS — I509 Heart failure, unspecified: Secondary | ICD-10-CM | POA: Diagnosis not present

## 2018-03-05 DIAGNOSIS — R740 Nonspecific elevation of levels of transaminase and lactic acid dehydrogenase [LDH]: Secondary | ICD-10-CM

## 2018-03-05 DIAGNOSIS — K5732 Diverticulitis of large intestine without perforation or abscess without bleeding: Secondary | ICD-10-CM | POA: Diagnosis present

## 2018-03-05 DIAGNOSIS — C3490 Malignant neoplasm of unspecified part of unspecified bronchus or lung: Secondary | ICD-10-CM | POA: Diagnosis not present

## 2018-03-05 DIAGNOSIS — I5042 Chronic combined systolic (congestive) and diastolic (congestive) heart failure: Secondary | ICD-10-CM

## 2018-03-05 DIAGNOSIS — D6189 Other specified aplastic anemias and other bone marrow failure syndromes: Secondary | ICD-10-CM | POA: Diagnosis not present

## 2018-03-05 DIAGNOSIS — R918 Other nonspecific abnormal finding of lung field: Secondary | ICD-10-CM

## 2018-03-05 DIAGNOSIS — K7689 Other specified diseases of liver: Secondary | ICD-10-CM | POA: Diagnosis present

## 2018-03-05 DIAGNOSIS — J439 Emphysema, unspecified: Secondary | ICD-10-CM | POA: Diagnosis not present

## 2018-03-05 DIAGNOSIS — C3492 Malignant neoplasm of unspecified part of left bronchus or lung: Secondary | ICD-10-CM | POA: Diagnosis not present

## 2018-03-05 DIAGNOSIS — R1311 Dysphagia, oral phase: Secondary | ICD-10-CM | POA: Diagnosis not present

## 2018-03-05 DIAGNOSIS — R9431 Abnormal electrocardiogram [ECG] [EKG]: Secondary | ICD-10-CM

## 2018-03-05 DIAGNOSIS — Z955 Presence of coronary angioplasty implant and graft: Secondary | ICD-10-CM | POA: Diagnosis not present

## 2018-03-05 DIAGNOSIS — I110 Hypertensive heart disease with heart failure: Secondary | ICD-10-CM | POA: Diagnosis not present

## 2018-03-05 DIAGNOSIS — Z79899 Other long term (current) drug therapy: Secondary | ICD-10-CM

## 2018-03-05 DIAGNOSIS — R0600 Dyspnea, unspecified: Secondary | ICD-10-CM

## 2018-03-05 DIAGNOSIS — N19 Unspecified kidney failure: Secondary | ICD-10-CM | POA: Diagnosis not present

## 2018-03-05 DIAGNOSIS — Z7951 Long term (current) use of inhaled steroids: Secondary | ICD-10-CM | POA: Diagnosis not present

## 2018-03-05 DIAGNOSIS — G4733 Obstructive sleep apnea (adult) (pediatric): Secondary | ICD-10-CM | POA: Diagnosis present

## 2018-03-05 DIAGNOSIS — N179 Acute kidney failure, unspecified: Secondary | ICD-10-CM | POA: Diagnosis present

## 2018-03-05 DIAGNOSIS — C7952 Secondary malignant neoplasm of bone marrow: Secondary | ICD-10-CM | POA: Diagnosis not present

## 2018-03-05 DIAGNOSIS — Z9119 Patient's noncompliance with other medical treatment and regimen: Secondary | ICD-10-CM

## 2018-03-05 DIAGNOSIS — I25700 Atherosclerosis of coronary artery bypass graft(s), unspecified, with unstable angina pectoris: Secondary | ICD-10-CM | POA: Diagnosis not present

## 2018-03-05 LAB — URINALYSIS, ROUTINE W REFLEX MICROSCOPIC
Bacteria, UA: NONE SEEN
Bilirubin Urine: NEGATIVE
Glucose, UA: NEGATIVE mg/dL
Ketones, ur: NEGATIVE mg/dL
Leukocytes, UA: NEGATIVE
Nitrite: NEGATIVE
PH: 6 (ref 5.0–8.0)
Protein, ur: NEGATIVE mg/dL
Specific Gravity, Urine: 1.023 (ref 1.005–1.030)

## 2018-03-05 LAB — I-STAT TROPONIN, ED: Troponin i, poc: 0.01 ng/mL (ref 0.00–0.08)

## 2018-03-05 LAB — FIBRINOGEN: Fibrinogen: 349 mg/dL (ref 210–475)

## 2018-03-05 LAB — COMPREHENSIVE METABOLIC PANEL
ALT: 46 U/L — AB (ref 0–44)
AST: 106 U/L — AB (ref 15–41)
Albumin: 2.7 g/dL — ABNORMAL LOW (ref 3.5–5.0)
Alkaline Phosphatase: 139 U/L — ABNORMAL HIGH (ref 38–126)
Anion gap: 13 (ref 5–15)
BUN: 14 mg/dL (ref 8–23)
CO2: 24 mmol/L (ref 22–32)
CREATININE: 1.45 mg/dL — AB (ref 0.61–1.24)
Calcium: 9.7 mg/dL (ref 8.9–10.3)
Chloride: 104 mmol/L (ref 98–111)
GFR calc Af Amer: 52 mL/min — ABNORMAL LOW (ref >60)
GFR calc non Af Amer: 45 mL/min — ABNORMAL LOW (ref >60)
Glucose, Bld: 106 mg/dL — ABNORMAL HIGH (ref 70–99)
Potassium: 2.8 mmol/L — ABNORMAL LOW (ref 3.5–5.1)
Sodium: 141 mmol/L (ref 135–145)
Total Bilirubin: 2.1 mg/dL — ABNORMAL HIGH (ref 0.3–1.2)
Total Protein: 5.3 g/dL — ABNORMAL LOW (ref 6.5–8.1)

## 2018-03-05 LAB — CBC WITH DIFFERENTIAL/PLATELET
Abs Immature Granulocytes: 0.44 10*3/uL — ABNORMAL HIGH (ref 0.00–0.07)
Basophils Absolute: 0 10*3/uL (ref 0.0–0.1)
Basophils Relative: 0 %
EOS PCT: 1 %
Eosinophils Absolute: 0 10*3/uL (ref 0.0–0.5)
HCT: 37.2 % — ABNORMAL LOW (ref 39.0–52.0)
Hemoglobin: 12.2 g/dL — ABNORMAL LOW (ref 13.0–17.0)
Immature Granulocytes: 6 %
Lymphocytes Relative: 22 %
Lymphs Abs: 1.6 10*3/uL (ref 0.7–4.0)
MCH: 30.1 pg (ref 26.0–34.0)
MCHC: 32.8 g/dL (ref 30.0–36.0)
MCV: 91.9 fL (ref 80.0–100.0)
Monocytes Absolute: 0.7 10*3/uL (ref 0.1–1.0)
Monocytes Relative: 9 %
Neutro Abs: 4.7 10*3/uL (ref 1.7–7.7)
Neutrophils Relative %: 62 %
Platelets: 8 10*3/uL — CL (ref 150–400)
RBC: 4.05 MIL/uL — ABNORMAL LOW (ref 4.22–5.81)
RDW: 14.6 % (ref 11.5–15.5)
WBC: 7.5 10*3/uL (ref 4.0–10.5)
nRBC: 1.5 % — ABNORMAL HIGH (ref 0.0–0.2)

## 2018-03-05 LAB — LACTATE DEHYDROGENASE: LDH: 532 U/L — ABNORMAL HIGH (ref 98–192)

## 2018-03-05 LAB — RETICULOCYTES
Immature Retic Fract: 31.5 % — ABNORMAL HIGH (ref 2.3–15.9)
RBC.: 4.15 MIL/uL — ABNORMAL LOW (ref 4.22–5.81)
Retic Count, Absolute: 104.2 10*3/uL (ref 19.0–186.0)
Retic Ct Pct: 2.5 % (ref 0.4–3.1)

## 2018-03-05 LAB — PROTIME-INR
INR: 1.25
Prothrombin Time: 15.6 seconds — ABNORMAL HIGH (ref 11.4–15.2)

## 2018-03-05 LAB — TYPE AND SCREEN
ABO/RH(D): O POS
Antibody Screen: NEGATIVE

## 2018-03-05 LAB — MAGNESIUM: Magnesium: 1.7 mg/dL (ref 1.7–2.4)

## 2018-03-05 LAB — APTT: aPTT: 37 seconds — ABNORMAL HIGH (ref 24–36)

## 2018-03-05 LAB — PLATELET COUNT: Platelets: 8 10*3/uL — CL (ref 150–400)

## 2018-03-05 LAB — SAVE SMEAR(SSMR), FOR PROVIDER SLIDE REVIEW

## 2018-03-05 LAB — I-STAT CREATININE, ED: Creatinine, Ser: 1.4 mg/dL — ABNORMAL HIGH (ref 0.61–1.24)

## 2018-03-05 MED ORDER — FLUTICASONE-UMECLIDIN-VILANT 100-62.5-25 MCG/INH IN AEPB: 1.0000 | INHALATION_SPRAY | Freq: Every day | RESPIRATORY_TRACT | Status: DC

## 2018-03-05 MED ORDER — FUROSEMIDE 40 MG PO TABS
40.0000 mg | ORAL_TABLET | Freq: Every day | ORAL | Status: DC
  Administered 2018-03-06 – 2018-03-09 (×4): 40 mg via ORAL
  Filled 2018-03-05 (×4): qty 1

## 2018-03-05 MED ORDER — IPRATROPIUM-ALBUTEROL 0.5-2.5 (3) MG/3ML IN SOLN
3.0000 mL | RESPIRATORY_TRACT | Status: DC | PRN
  Administered 2018-03-06 – 2018-03-07 (×2): 3 mL via RESPIRATORY_TRACT
  Filled 2018-03-05 (×2): qty 3

## 2018-03-05 MED ORDER — ALBUTEROL SULFATE (2.5 MG/3ML) 0.083% IN NEBU: 3.0000 mL | INHALATION_SOLUTION | Freq: Four times a day (QID) | RESPIRATORY_TRACT | Status: DC | PRN

## 2018-03-05 MED ORDER — IOPAMIDOL (ISOVUE-370) INJECTION 76%
100.0000 mL | Freq: Once | INTRAVENOUS | Status: AC | PRN
  Administered 2018-03-05: 100 mL via INTRAVENOUS

## 2018-03-05 MED ORDER — SODIUM CHLORIDE 0.9 % IV SOLN
250.0000 mL | INTRAVENOUS | Status: DC | PRN
  Administered 2018-03-06 – 2018-03-08 (×2): 250 mL via INTRAVENOUS

## 2018-03-05 MED ORDER — SODIUM CHLORIDE 0.9 % IV SOLN
2.0000 g | Freq: Once | INTRAVENOUS | Status: AC
  Administered 2018-03-05: 2 g via INTRAVENOUS
  Filled 2018-03-05: qty 20

## 2018-03-05 MED ORDER — METRONIDAZOLE IN NACL 5-0.79 MG/ML-% IV SOLN
500.0000 mg | Freq: Three times a day (TID) | INTRAVENOUS | Status: DC
  Administered 2018-03-06: 500 mg via INTRAVENOUS
  Filled 2018-03-05: qty 100

## 2018-03-05 MED ORDER — IOPAMIDOL (ISOVUE-370) INJECTION 76%
INTRAVENOUS | Status: AC
  Filled 2018-03-05: qty 100

## 2018-03-05 MED ORDER — SENNOSIDES-DOCUSATE SODIUM 8.6-50 MG PO TABS: 1.0000 | ORAL_TABLET | Freq: Every evening | ORAL | Status: DC | PRN

## 2018-03-05 MED ORDER — SODIUM CHLORIDE 0.9% FLUSH
3.0000 mL | Freq: Two times a day (BID) | INTRAVENOUS | Status: DC
  Administered 2018-03-06 – 2018-03-21 (×15): 3 mL via INTRAVENOUS

## 2018-03-05 MED ORDER — ADULT MULTIVITAMIN W/MINERALS CH
1.0000 | ORAL_TABLET | Freq: Every day | ORAL | Status: DC
  Administered 2018-03-06 – 2018-03-21 (×15): 1 via ORAL
  Filled 2018-03-05 (×16): qty 1

## 2018-03-05 MED ORDER — DOXYCYCLINE HYCLATE 100 MG PO TABS
100.0000 mg | ORAL_TABLET | Freq: Two times a day (BID) | ORAL | Status: DC
  Administered 2018-03-06: 100 mg via ORAL
  Filled 2018-03-05: qty 1

## 2018-03-05 MED ORDER — ROSUVASTATIN CALCIUM 20 MG PO TABS
40.0000 mg | ORAL_TABLET | Freq: Every day | ORAL | Status: DC
  Administered 2018-03-06 – 2018-03-09 (×4): 40 mg via ORAL
  Filled 2018-03-05 (×4): qty 2

## 2018-03-05 MED ORDER — SODIUM CHLORIDE 0.9 % IV SOLN
1.0000 g | INTRAVENOUS | Status: DC
  Administered 2018-03-06 – 2018-03-07 (×2): 1 g via INTRAVENOUS
  Filled 2018-03-05 (×2): qty 10

## 2018-03-05 MED ORDER — METRONIDAZOLE IN NACL 5-0.79 MG/ML-% IV SOLN
500.0000 mg | Freq: Once | INTRAVENOUS | Status: AC
  Administered 2018-03-05: 500 mg via INTRAVENOUS
  Filled 2018-03-05: qty 100

## 2018-03-05 MED ORDER — POTASSIUM CHLORIDE CRYS ER 20 MEQ PO TBCR
40.0000 meq | EXTENDED_RELEASE_TABLET | Freq: Once | ORAL | Status: AC
  Administered 2018-03-05: 40 meq via ORAL
  Filled 2018-03-05: qty 2

## 2018-03-05 MED ORDER — IPRATROPIUM-ALBUTEROL 0.5-2.5 (3) MG/3ML IN SOLN
3.0000 mL | Freq: Once | RESPIRATORY_TRACT | Status: AC
  Administered 2018-03-05: 3 mL via RESPIRATORY_TRACT
  Filled 2018-03-05: qty 3

## 2018-03-05 MED ORDER — SODIUM CHLORIDE 0.9% FLUSH: 3.0000 mL | INTRAVENOUS | Status: DC | PRN

## 2018-03-05 MED ORDER — DOXYCYCLINE HYCLATE 100 MG PO TABS
100.0000 mg | ORAL_TABLET | Freq: Once | ORAL | Status: AC
  Administered 2018-03-05: 100 mg via ORAL
  Filled 2018-03-05: qty 1

## 2018-03-05 MED ORDER — CARVEDILOL 12.5 MG PO TABS
12.5000 mg | ORAL_TABLET | Freq: Two times a day (BID) | ORAL | Status: DC
  Administered 2018-03-06 – 2018-03-21 (×29): 12.5 mg via ORAL
  Filled 2018-03-05 (×29): qty 1

## 2018-03-05 NOTE — Consult Note (Signed)
New Hematology/Oncology Consult   Requesting MD: Joseph Quaker, PA       Reason for Consult: Thrombocytopenia  HPI: Joseph Berg presented to the emergency room today with dyspnea.  He reports the onset of dyspnea for the past few days.  A CBC from 03/05/2018 returned with a hemoglobin of 12.2, platelets 8000, WBC 7.5, absolute neutrophil count 4.7.  He reports no previous history of thrombocytopenia.  He was admitted 01/18/2018 with multifocal pneumonia.  He was treated with ceftriaxone and azithromycin followed by cefdinir and a prednisone taper.  A follow-up chest x-ray 02/14/2018 revealed improved bilateral airspace opacities.  A CT chest 03/05/2018 revealed an area of consolidation in the left lower lobe with mildly enlarged mediastinal and left hilar lymph nodes.  No bleeding, fever, or neurologic symptoms.    Past Medical History:  Diagnosis Date  . BPH (benign prostatic hyperplasia)   . CAD (coronary artery disease)    a. s/p CABG x1 with LIMA-LAD 1994. b. LM & 3v CAD by cath 07/2012  . CHF (congestive heart failure) ([place])    a. EF 35-40% by echo 07/2012.  [name] Complication of anesthesia    affected patients memory. Pt stated "I couldn't remember anything for three months...just bits and pieces"  . Emphysema    a. Moderate emphysema by CT 06/2012.  [name] Heart attack ([place]) 1994  . Hyperlipidemia   . Hypertension   . Hypoparathyroidism ([place])   . NSVT (nonsustained ventricular tachycardia) ([place])    a. Seen on tele 07/2012.  . OSA on CPAP    pt stated he does not wear CPAP because he no longer has sleep apnea  . Pneumonia  01/11/2018  . Pseudoaneurysm of left femoral artery ([place])    a. post cath s/p compression 07/2012, associated Berg/ anemia.  . Sciatica 2012   Qualifier: Diagnosis of  By: Joseph Fireman MD, Barnetta Chapel    :  Past Surgical History:  Procedure Laterality Date  . CATARACT EXTRACTION, BILATERAL    . COLONOSCOPY    . CORONARY ARTERY BYPASS GRAFT  03-23-92  . CORONARY  ARTERY BYPASS GRAFT N/A 08/14/2012   Procedure: REDO CORONARY ARTERY BYPASS GRAFTING (CABG);  Surgeon: Joseph Pollack, MD;  Location: [facility];  Service: Open Heart Surgery;  Laterality: N/A;  . INTRAOPERATIVE TRANSESOPHAGEAL ECHOCARDIOGRAM N/A 08/14/2012   Procedure: INTRAOPERATIVE TRANSESOPHAGEAL ECHOCARDIOGRAM;  Surgeon: Joseph Pollack, MD;  Location: [facility];  Service: Open Heart Surgery;  Laterality: N/A;  . LUMBAR LAMINECTOMY/DECOMPRESSION MICRODISCECTOMY Left 02/18/2015   Procedure: LUMBAR LAMINECTOMY/DECOMPRESSION MICRODISCECTOMY- LEFT FOUR-FIVE ;  Surgeon: Joseph Pall, MD;  Location: [facility];  Service: Neurosurgery;  Laterality: Left;  :   Current Facility-Administered Medications:  .  cefTRIAXone (ROCEPHIN) 2 g in sodium chloride 0.9 % 100 mL IVPB, 2 g, Intravenous, Once, Last Rate: 200 mL/hr at 03/05/18 2058, 2 g at 03/05/18 2058 **AND** metroNIDAZOLE (FLAGYL) IVPB 500 mg, 500 mg, Intravenous, Once, Hammond, Joseph W, PA-C .  iopamidol (ISOVUE-370) 76 % injection, , , ,   Current Outpatient Medications:  .  acetaminophen (TYLENOL) 500 MG tablet, Take 1-2 tablets (500-1,000 mg total) by mouth every 6 (six) hours as needed for pain., Disp: 30 tablet, Rfl: 0 .  AMBULATORY NON FORMULARY MEDICATION, Nebulizer - use as needed - with supplies.  Diagnosis of COPD J44.1, Disp: 1 each, Rfl: 0 .  AMBULATORY NON FORMULARY MEDICATION, Knee-high, medium compression, graduated compression stockings. Apply to lower extremities. [email or web address], Zippered Compression Stockings, medium circ, long length, Disp: 1 each,  Rfl: 0 .  Ascorbic Acid (VITAMIN C) 1000 MG tablet, Take 1,000 mg by mouth daily., Disp: , Rfl:  .  aspirin 81 MG tablet, Take 81 mg by mouth daily., Disp: , Rfl:  .  carvedilol (COREG) 12.5 MG tablet, Take 1 tablet (12.5 mg total) by mouth 2 (two) times daily with a meal., Disp: 180 tablet, Rfl: 3 .  Cholecalciferol (VITAMIN D PO), Take 3,000 mg by mouth daily., Disp: , Rfl:  .   cilostazol (PLETAL) 100 MG tablet, Take 1 tablet (100 mg total) by mouth 2 (two) times daily., Disp: 60 tablet, Rfl: 3 .  Cyanocobalamin (VITAMIN B-12 PO), Take 1 tablet by mouth daily., Disp: , Rfl:  .  finasteride (PROSCAR) 5 MG tablet, TAKE 1 TABLET BY MOUTH EVERY DAY (Patient taking differently: Take 5 mg by mouth daily. ), Disp: 90 tablet, Rfl: 1 .  Fluticasone-Umeclidin-Vilant (TRELEGY ELLIPTA) 100-62.5-25 MCG/INH AEPB, Inhale 1 Inhaler into the lungs daily., Disp: 3 each, Rfl: 1 .  furosemide (LASIX) 40 MG tablet, Take 1 tablet (40 mg total) by mouth daily., Disp: 90 tablet, Rfl: 3 .  ipratropium-albuterol (DUONEB) 0.5-2.5 (3) MG/3ML SOLN, Take 3 mLs by nebulization every 2 (two) hours as needed., Disp: , Rfl:  .  Multiple Vitamin (MULTIVITAMIN) capsule, Take 1 capsule by mouth daily., Disp: , Rfl:  .  Nebulizer MISC, Generic nebulizer plus supplies, Disp: 1 each, Rfl: 0 .  Probiotic Product (TRUBIOTICS PO), Take 1 tablet by mouth daily., Disp: , Rfl:  .  rosuvastatin (CRESTOR) 40 MG tablet, Take 1 tablet (40 mg total) by mouth daily., Disp: 30 tablet, Rfl: 5 .  senna-docusate (SENOKOT-S) 8.6-50 MG tablet, Take 1 tablet by mouth at bedtime as needed for mild constipation., Disp: 10 tablet, Rfl: 0 .  VENTOLIN HFA 108 (90 Base) MCG/ACT inhaler, TAKE 2 PUFFS BY MOUTH EVERY 6 HOURS AS NEEDED FOR WHEEZE OR SHORTNESS OF BREATH (Patient taking differently: Inhale 2 puffs into the lungs every 6 (six) hours as needed. ), Disp: 18 Inhaler, Rfl: 2:  . iopamidol      :  Allergies  Allergen Reactions  . Hydrochlorothiazide Other (See Comments)    Affected renal function.  "affected kidneys and calcium level"  :  FH: No family history of a hematologic condition or cancer  SOCIAL HISTORY: He lives with his wife in [place].  He is retired.  He smokes cigarettes.  He has a history of heavy alcohol use, none in the past 5 months.  No transfusion history.  No risk factor for HIV or  hepatitis.  Review of Systems:  Positives include: Dyspnea  A complete ROS was otherwise negative.   Physical Exam:  Blood pressure 127/70, pulse 94, temperature 98.5 F (36.9 C), temperature source Oral, resp. rate (!) 29, height 5' 9" (1.753 m), weight 210 lb (95.3 kg), SpO2 91 %.  HEENT: The mouth is dry, mild white coat over the tongue, no oral bleeding Lungs: Distant breath sounds, scattered wheeze, no respiratory distress Cardiac: Distant heart sounds, regular rate and rhythm Abdomen: No hepatosplenomegaly, nontender GU: Testes without mass Vascular: No leg edema Lymph nodes: No cervical, supraclavicular, axillary, or inguinal nodes Neurologic: Alert and oriented, the motor exam appears intact in the upper and lower extremities bilaterally Skin: Small ecchymoses over the arms bilaterally, few petechiae at the lower legs Musculoskeletal: No spine tenderness  LABS:  Recent Labs    03/05/18 1636  WBC 7.5  HGB 12.2*  HCT 37.2*  PLT 8*  Blood smear: The platelets are decreased in number, mostly small.  No platelet clumps.  Numerous nucleated red blood cells.  There are burr cells and ovalocytes, no schistocytes.  The majority of the white cells are mature neutrophils.  There are a few myelocytes.  Few atypical mononuclear cells with high nuclear/cytoplasmic ratio and blue cytoplasm- blast forms?  Recent Labs    03/05/18 1636 03/05/18 1752  NA 141  --   K 2.8*  --   CL 104  --   CO2 24  --   GLUCOSE 106*  --   BUN 14  --   CREATININE 1.45* 1.40*  CALCIUM 9.7  --       RADIOLOGY:  Dg Chest 2 View  Result Date: 03/05/2018 CLINICAL DATA:  Short of breath.  Recent pneumonia EXAM: CHEST - 2 VIEW COMPARISON:  None. FINDINGS: Sternotomy wires overlie normal cardiac silhouette. Normal pulmonary vasculature. No effusion, infiltrate, or pneumothorax. No acute osseous abnormality. IMPRESSION: No acute cardiopulmonary process. Electronically Signed   By: Suzy Bouchard  M.D.   On: 03/05/2018 15:11   Dg Chest 2 View  Result Date: 02/14/2018 CLINICAL DATA:  COPD is aspiration.  Follow-up pneumonia. EXAM: CHEST - 2 VIEW COMPARISON:  01/15/2018 FINDINGS: Borderline cardiomegaly. Normal vascularity. Bilateral central and basilar airspace opacities have improved. Some disease persists. Postoperative changes and sternotomy are noted. No pneumothorax. No pleural effusion. IMPRESSION: Improving bilateral airspace opacities. Electronically Signed   By: Marybelle Killings M.D.   On: 02/14/2018 11:02   Ct Angio Chest/abd/pel For Dissection Berg And/or Berg/wo  Addendum Date: 03/05/2018   ADDENDUM REPORT: 03/05/2018 20:17 ADDENDUM: Correction: There are several omissions in the body of the chest CT report due to recent change from table mike to speech microphone. Under cardiovascular, it should state NO large central pulmonary embolus is noted. And likewise NO thoracic aortic aneurysm is identified. Under lungs/pleura common should state: As stated above, there is pulmonary consolidation in the superior segment of the left lower lobe abutting the pleura. A small right effusion and atelectasis is noted. Centrilobular emphysema is noted, upper lobe predominant. Findings discussed with Joseph Berg. Electronically Signed   By: Ashley Royalty M.D.   On: 03/05/2018 20:17   Result Date: 03/05/2018 CLINICAL DATA:  Dyspnea on exertion. Recently diagnosed 12 5 discharge 67 EXAM: CT ANGIOGRAPHY CHEST, ABDOMEN AND PELVIS TECHNIQUE: Multidetector CT imaging through the chest, abdomen and pelvis was performed using the standard protocol during bolus administration of intravenous contrast. Multiplanar reconstructed images and MIPs were obtained and reviewed to evaluate the vascular anatomy. CONTRAST:  11m ISOVUE-370 IOPAMIDOL (ISOVUE-370) INJECTION 76% COMPARISON:  None. FINDINGS: CTA CHEST FINDINGS Cardiovascular: The unenhanced images through the chest demonstrate. Atherosclerosis of the thoracic aorta  branch vessels are. Native main three-vessel coronary arteriosclerosis is noted with post CABG change present heart size is top normal effusion After IV contrast administration thoracic aortic aneurysm is identified. Linear streak artifacts are noted within the common carotid and subclavian veins emanating hyperdense contrast the adjacent left subclavian conclusive evidence dissection. Large central embolus is noted though the study is not tailored toward assessment emboli Mediastinum/Nodes: Normal sized thyroid gland without dominant mass. Mildly enlarged left tracheobronchial lymph nodes measuring up to 12 mm short axis larger 2 cm short axis left hilar soft tissue density/lymph nodes noted. These may be reactive given what appear to pulmonary consolidations in the superior segment of the left lower lobe compatible with pneumonia and/or atelectasis. Patent trachea and mainstem bronchi. The CT  appearance of the esophagus is unremarkable. Lungs/Pleura: As stated above, there is pulmonary consolidation in superior segment of the lower lobe abutting the small right effusion and atelectasis. Centrilobular emphysema is, upper lobe. No pneumothorax. Musculoskeletal: No chest wall abnormality. No acute or significant osseous findings. Mild degenerative change along the thoracic spine. Median sternotomy sutures are in place. Review of the MIP images confirms the above findings. CTA ABDOMEN AND PELVIS FINDINGS VASCULAR Aorta: 4.5 cm infrarenal abdominal aortic aneurysm crescentic soft plaque along the posterior and left lateral wall series 8/92. Dissection. Celiac: Minimal atherosclerosis at the origin celiac axis patent branch vessels with atherosclerosis along the splenic artery dissection, aneurysm or significant stenosis. SMA: Patent with atherosclerotic origins. Occlusion or dissection aneurysm Renals: Single bilateral renal arteries atherosclerosis but fibromuscular dysplasia, stenosis, aneurysm or dissection  identified. IMA: Patent Inflow: Patent without evidence of aneurysm, dissection, vasculitis or significant stenosis. Atherosclerosis common iliac arteries probable chronic calcified dissection involving the proximal left common iliac artery, series 8/233. Veins: No obvious venous abnormality within the limitations of this arterial phase study. Review of the MIP images confirms the above findings. NON-VASCULAR Hepatobiliary: No focal liver abnormality is seen. No gallstones, gallbladder wall thickening, or biliary dilatation. Pancreas: Mild ectasia of the pancreatic duct. No inflammation or dominant enhancing mass. Spleen: Normal Adrenals/Urinary Tract: Normal bilateral adrenal glands. Bilateral renal cysts without obstructive uropathy. No solid enhancing masses are noted. The dependent layering bladder calculi are identified. Diverticulosis of the bladder is also noted at the dome of the bladder. Stomach/Bowel: Decompressed stomach with normal duodenal sweep and ligament of Treitz position. No acute bowel obstruction. Extensive colonic diverticulosis is noted along the descending sigmoid colon. Suggestion mild pericolonic inflammation along the mid sigmoid raises the possibility of a subtle sigmoid diverticulitis, series 8/250. Lymphatic: No chest, abdomen or pelvic adenopathy. Reproductive: Mildly enlarged prostate with peripheral zone calcifications. Other: No free air or free fluid. Musculoskeletal: Lumbar spondylosis with multilevel degenerative disc and endplate changes. Schmorl's node involving the superior endplate of L3. Review of the MIP images confirms the above findings. IMPRESSION: 1. 4.5 cm infrarenal abdominal aortic aneurysm with crescentic soft plaque along the posterior and left lateral wall. Patent branch vessels with atherosclerosis 2. No thoracic aortic aneurysm or dissection. No acute pulmonary embolus. 3. Left lower lobe pneumonia with with mediastinal and left hilar reactive adenopathy.  Follow-up to assure resolution is suggested. 4. Bilateral renal cysts. 5. Descending and sigmoid colonic diverticulosis with subtle pericolonic fatty induration along the mid sigmoid raises the possibility of a mild uncomplicated sigmoid diverticulitis. Electronically Signed: By: Ashley Royalty M.D. On: 03/05/2018 19:57    Assessment and Plan:   1.  Thrombocytopenia 2.  Left lung airspace consolidation-pneumonia? 3.  COPD 4.  History of coronary artery disease 5.  CHF 6.  BPH 7.  Admission with pneumonia December 2019 8.  Mild renal insufficiency 9.  Elevated liver enzymes  Joseph Berg presents with dyspnea.  The dyspnea is likely related to COPD versus pneumonia.  He has severe thrombocytopenia.  The thrombocytopenia may be related to ITP, polypharmacy, bone marrow suppression from infection, DIC, or a malignancy.   The creatinine and bilirubin were similarly elevated when he was here in December 2019.  He may have underlying liver disease.  I have a low clinical suspicion for a diagnosis of TTP or HIT.  The differential diagnosis includes ITP, but the peripheral blood smear findings argue against this diagnosis.  I recommend proceeding with a bone marrow biopsy.  Recommendations:  1.  Check coagulation parameters 2.  LDH 3.  Transfuse platelets for bleeding or if the platelet count is lower tomorrow 4.  Diagnostic bone marrow biopsy          [name], MD 03/05/2018, 9:12 PM

## 2018-03-05 NOTE — ED Triage Notes (Signed)
SOB Since yesterday O2 saturation in the 80's, has been 92-93. He was treated for pneumonia about one month ago.

## 2018-03-05 NOTE — Discharge Instructions (Addendum)
You are being transported to the hospital for further evaluation of your hypoxia.

## 2018-03-05 NOTE — ED Notes (Signed)
ED Provider at bedside. 

## 2018-03-05 NOTE — ED Provider Notes (Signed)
[name]    CSN: [id number] Arrival date & time: 03/05/18  1350     History   Chief Complaint Chief Complaint  Patient presents with  . Shortness of Breath    HPI Joseph Berg is a 85 y.o. male.   HPI Patient seen with acute shortness of breath over the last few days.  Of note patient was hospitalized recently with multifocal pneumonia.  He was treated with ceftriaxone and Zithromax and discharged home on Omnicef.  He followed up with his PCP and had a chest x-ray done on 02/14/2018 which showed what appeared to be improving bilateral airspace opacities.  He was improved by O2 saturation 90 to 93% and so his oxygen was DC'd last Wednesday.  Starting over the weekend he became increasingly short of breath with increasing dyspnea on exertion.  He states he does not feel particularly ill.  He has had no fever or chills and has been able to eat. Past Medical History:  Diagnosis Date  . BPH (benign prostatic hyperplasia)   . CAD (coronary artery disease)    a. s/p CABG x1 with LIMA-LAD 1994. b. LM & 3v CAD by cath 07/2012  . CHF (congestive heart failure) ([place])    a. EF 35-40% by echo 07/2012.  [name] Complication of anesthesia    affected patients memory. Pt stated "I couldn't remember anything for three months...just bits and pieces"  . Emphysema    a. Moderate emphysema by CT 06/2012.  [name] Heart attack ([place]) 1994  . Hyperlipidemia   . Hypertension   . Hypoparathyroidism ([place])   . NSVT (nonsustained ventricular tachycardia) ([place])    a. Seen on tele 07/2012.  . OSA on CPAP    pt stated he does not wear CPAP because he no longer has sleep apnea  . Pneumonia   . Pseudoaneurysm of left femoral artery ([place])    a. post cath s/p compression 07/2012, associated w/ anemia.  . Sciatica 2012   Qualifier: Diagnosis of  By: Madilyn Fireman MD, [place]      Patient Active Problem List   Diagnosis Date Noted  . Acute on chronic combined systolic and diastolic CHF (congestive heart  failure) ([place]) 01/12/2018  . Multifocal pneumonia 01/11/2018  . Primary osteoarthritis of right shoulder 01/27/2017  . Bilateral calf pain 01/27/2017  . Trochanteric bursitis of both hips 12/28/2016  . Demand ischemia ([place])   . AKI (acute kidney injury) ([place]) 03/17/2016  . Hyperglycemia 03/17/2016  . Postlaminectomy syndrome, lumbar region 12/14/2015  . COPD exacerbation ([place]) 03/27/2015  . HNP (herniated nucleus pulposus), lumbar 02/18/2015  . Claudication of right lower extremity ([place]) 12/09/2014  . Lumbar degenerative disc disease 05/13/2014  . Mixed restrictive and obstructive lung disease ([place]) 01/16/2014  . Cerebrovascular disease 06/26/2013  . AAA (abdominal aortic aneurysm) without rupture ([place]) 06/24/2013  . Aortic calcification ([place]) 06/10/2013  . S/P CABG x 2 08/17/2012  . Cardiomyopathy, ischemic 07/25/2012  . Chronic systolic congestive heart failure ([place]) 07/11/2012  . CAD (coronary artery disease) of artery bypass graft 07/06/2012  . OSA (obstructive sleep apnea) 01/24/2012  . BPH (benign prostatic hyperplasia) 03/02/2011  . History of tobacco abuse 08/23/2010  . Hypoparathyroidism ([place]) 07/30/2010  . SCIATICA 11/10/2009  . INSULIN RESISTANCE SYNDROME 04/16/2007  . HYPERLIPIDEMIA NEC/NOS 11/14/2006  . HYPERTENSION, BENIGN ESSENTIAL 11/14/2006    Past Surgical History:  Procedure Laterality Date  . CATARACT EXTRACTION, BILATERAL    . COLONOSCOPY    . CORONARY ARTERY BYPASS GRAFT  03-23-92  . CORONARY ARTERY BYPASS GRAFT N/A 08/14/2012   Procedure: REDO CORONARY ARTERY BYPASS GRAFTING (CABG);  Surgeon: Gaye Pollack, MD;  Location: [facility];  Service: Open Heart Surgery;  Laterality: N/A;  . INTRAOPERATIVE TRANSESOPHAGEAL ECHOCARDIOGRAM N/A 08/14/2012   Procedure: INTRAOPERATIVE TRANSESOPHAGEAL ECHOCARDIOGRAM;  Surgeon: Gaye Pollack, MD;  Location: [facility];  Service: Open Heart Surgery;  Laterality: N/A;  . LUMBAR LAMINECTOMY/DECOMPRESSION MICRODISCECTOMY Left 02/18/2015    Procedure: LUMBAR LAMINECTOMY/DECOMPRESSION MICRODISCECTOMY- LEFT FOUR-FIVE ;  Surgeon: Ashok Pall, MD;  Location: [facility];  Service: Neurosurgery;  Laterality: Left;       Home Medications    Prior to Admission medications   Medication Sig Start Date End Date Taking? Authorizing Provider  acetaminophen (TYLENOL) 500 MG tablet Take 1-2 tablets (500-1,000 mg total) by mouth every 6 (six) hours as needed for pain. 08/03/12   Dunn, Nedra Hai, PA-C  AMBULATORY NON FORMULARY MEDICATION Nebulizer - use as needed - with supplies.  Diagnosis of COPD J44.1 09/19/17   [name], MD  AMBULATORY NON FORMULARY MEDICATION Knee-high, medium compression, graduated compression stockings. Apply to lower extremities. [email or web address], Zippered Compression Stockings, medium circ, long length 11/15/17   Silverio Decamp, MD  Ascorbic Acid (VITAMIN C) 1000 MG tablet Take 1,000 mg by mouth daily.    [provider]  aspirin 81 MG tablet Take 81 mg by mouth daily.    [provider]  carvedilol (COREG) 12.5 MG tablet Take 1 tablet (12.5 mg total) by mouth 2 (two) times daily with a meal. 07/05/17   Crenshaw, Denice Bors, MD  Cholecalciferol (VITAMIN D PO) Take 3,000 mg by mouth daily.    [provider]  cilostazol (PLETAL) 100 MG tablet Take 1 tablet (100 mg total) by mouth 2 (two) times daily. 11/15/17   Silverio Decamp, MD  Cyanocobalamin (VITAMIN B-12 PO) Take 1 tablet by mouth daily.    [provider]  finasteride (PROSCAR) 5 MG tablet TAKE 1 TABLET BY MOUTH EVERY DAY Patient taking differently: Take 5 mg by mouth daily.  10/10/17   [name], MD  Fluticasone-Umeclidin-Vilant (TRELEGY ELLIPTA) 100-62.5-25 MCG/INH AEPB Inhale 1 Inhaler into the lungs daily. 01/09/18   [name], MD  furosemide (LASIX) 40 MG tablet Take 1 tablet (40 mg total) by mouth daily. 07/05/17   Lelon Perla, MD  ipratropium-albuterol (DUONEB)  0.5-2.5 (3) MG/3ML SOLN Take 3 mLs by nebulization every 2 (two) hours as needed.    [provider]  Multiple Vitamin (MULTIVITAMIN) capsule Take 1 capsule by mouth daily.    [provider]  Nebulizer MISC Generic nebulizer plus supplies 03/26/15   Emeterio Reeve, DO  Probiotic Product (TRUBIOTICS PO) Take 1 tablet by mouth daily.    [provider]  rosuvastatin (CRESTOR) 40 MG tablet Take 1 tablet (40 mg total) by mouth daily. 11/27/17   Lelon Perla, MD  senna-docusate (SENOKOT-S) 8.6-50 MG tablet Take 1 tablet by mouth at bedtime as needed for mild constipation. 01/18/18   Domenic Polite, MD  VENTOLIN HFA 108 (90 Base) MCG/ACT inhaler TAKE 2 PUFFS BY MOUTH EVERY 6 HOURS AS NEEDED FOR WHEEZE OR SHORTNESS OF BREATH Patient taking differently: Inhale 2 puffs into the lungs every 6 (six) hours as needed.  12/28/17   [name], MD    Family History Family History  Problem Relation Age of Onset  . Heart attack Father 32  . Stroke Brother 57  . Hypertension Brother   .  Hyperlipidemia Brother     Social History Social History   Tobacco Use  . Smoking status: Current Some Day Smoker    Packs/day: 1.00    Years: 60.00    Pack years: 60.00    Types: Cigarettes, Pipe  . Smokeless tobacco: Never Used  . Tobacco comment: 01/11/2018 "smokes cigars only now"  Substance Use Topics  . Alcohol use: Yes    Comment: Occasional  . Drug use: No     Allergies   Hydrochlorothiazide   Review of Systems Review of Systems  Constitutional: Negative.   HENT: Negative.   Respiratory: Positive for cough and shortness of breath.   Cardiovascular: Negative for chest pain.  Gastrointestinal: Negative.   Skin: Negative.      Physical Exam Triage Vital Signs ED Triage Vitals  Enc Vitals Group     BP 03/05/18 1415 122/80     Pulse Rate 03/05/18 1415 98     Resp --      Temp 03/05/18 1415 98.2 F (36.8 C)     Temp Source 03/05/18 1415 Oral      SpO2 03/05/18 1415 (!) 86 %     Weight 03/05/18 1419 215 lb (97.5 kg)     Height 03/05/18 1419 5\' 9"  (1.753 m)     Head Circumference --      Peak Flow --      Pain Score 03/05/18 1419 0     Pain Loc --      Pain Edu? --      Excl. in [place]? --    No data found.  Updated Vital Signs BP 122/80 (BP Location: Right Arm)   Pulse 98   Temp 98.2 F (36.8 C) (Oral)   Ht 5\' 9"  (1.753 m)   Wt 97.5 kg   SpO2 95%   BMI 31.75 kg/m   Visual Acuity Right Eye Distance:   Left Eye Distance:   Bilateral Distance:    Right Eye Near:   Left Eye Near:    Bilateral Near:     Physical Exam Constitutional:      Appearance: He is obese.  HENT:     Head: Normocephalic.  Neck:     Musculoskeletal: Normal range of motion.  Cardiovascular:     Rate and Rhythm: Normal rate and regular rhythm.     Comments: A gallop rhythm was noted. Pulmonary:     Comments: There is a rapid respiratory rate.  There are diminished breath sounds on the left with dullness in that area.  No audible wheezes were heard. Neurological:     Mental Status: He is alert.      UC Treatments / Results  Labs (all labs ordered are listed, but only abnormal results are displayed) Labs Reviewed - No data to display  EKG None  Radiology Dg Chest 2 View  Result Date: 03/05/2018 CLINICAL DATA:  Short of breath.  Recent pneumonia EXAM: CHEST - 2 VIEW COMPARISON:  None. FINDINGS: Sternotomy wires overlie normal cardiac silhouette. Normal pulmonary vasculature. No effusion, infiltrate, or pneumothorax. No acute osseous abnormality. IMPRESSION: No acute cardiopulmonary process. Electronically Signed   By: Suzy Bouchard M.D.   On: 03/05/2018 15:11    Procedures Procedures (including critical care time)  Medications Ordered in UC Medications - No data to display  Initial Impression / Assessment and Plan / UC Course  I have reviewed the triage vital signs and the nursing notes.  Pertinent labs & imaging results  that were available during  my care of the patient were reviewed by me and considered in my medical decision making (see chart for details). Not clear to be why he has now worsened with his hypoxia.  He is not wheezing on exam.  He does not aerate the left lung well.  On arrival to the urgent care pulse ox was 86 and on 2 L improved to 95%.  Chest x-ray was done.  He will be transported to the hospital for reevaluation of his cardiorespiratory status.     Final Clinical Impressions(s) / UC Diagnoses   Final diagnoses:  Hypoxia  Chronic combined systolic and diastolic congestive heart failure (HCC)  COPD with hypoxia [facility])     Discharge Instructions     You are being transported to the hospital for further evaluation of your hypoxia.    ED Prescriptions    None     Controlled Substance Prescriptions [place] Controlled Substance Registry consulted? Not Applicable   Darlyne Russian, MD 03/05/18 [phone]

## 2018-03-05 NOTE — Telephone Encounter (Signed)
Joseph Berg, PT with [facility], called to let Dr Madilyn Fireman know that at rest during PT today pt's oxygen was staying around 84. Curt Bears notes pt did some breathing treatments but oxygen only went up to 88 for a short period of time before falling back to 84.  Curt Bears called pt's wife and she is taking pt to Urgent Care now to be evaluated.   FYI to PCP

## 2018-03-05 NOTE — ED Notes (Signed)
Attempted report x1. 

## 2018-03-05 NOTE — H&P (Signed)
History and Physical    Joseph Berg:[id number] DOB: [birth year] DOA: 03/05/2018  PCP: [name], MD  Patient coming from: Home  Chief Complaint: Shortness of breath  HPI: Joseph Berg is a 85 y.o. male with medical history significant of CHF, COPD, hypertension, coronary artery disease has been having progressive worsening shortness of breath for approximately 2 days.  Patient was given physical therapy today when he was noted to have oxygen sats in the 84% range on room air.  Patient does not require supplemental oxygen.  Patient does not reports a cough and some subjective fevers and chills at home.  He denies any lower extremity swelling or edema.  He denies any abdominal pain or chest pain.  He denies any nausea vomiting or diarrhea.  He denies any bleeding from anywhere including his gums.  Patient today found to have probable pneumonia.  He did have a bout of pneumonia last month which she recovered fully from.  He denies any sick contacts.  He is also found to have a platelet count of 8 for which hematology is emergently seeing him for tonight.  Patient is being referred for admission for likely pneumonia with profound thrombocytopenia.  Review of Systems: As per HPI otherwise 10 point review of systems negative.   Past Medical History:  Diagnosis Date  . BPH (benign prostatic hyperplasia)   . CAD (coronary artery disease)    a. s/p CABG x1 with LIMA-LAD 1994. b. LM & 3v CAD by cath 07/2012  . CHF (congestive heart failure) ([place])    a. EF 35-40% by echo 07/2012.  [name] Complication of anesthesia    affected patients memory. Pt stated "I couldn't remember anything for three months...just bits and pieces"  . Emphysema    a. Moderate emphysema by CT 06/2012.  [name] Heart attack ([place]) 1994  . Hyperlipidemia   . Hypertension   . Hypoparathyroidism ([place])   . NSVT (nonsustained ventricular tachycardia) ([place])    a. Seen on tele 07/2012.  . OSA on CPAP    pt stated he  does not wear CPAP because he no longer has sleep apnea  . Pneumonia   . Pseudoaneurysm of left femoral artery ([place])    a. post cath s/p compression 07/2012, associated w/ anemia.  . Sciatica 2012   Qualifier: Diagnosis of  By: Madilyn Fireman MD, Barn[facility] Chapel      Past Surgical History:  Procedure Laterality Date  . CATARACT EXTRACTION, BILATERAL    . COLONOSCOPY    . CORONARY ARTERY BYPASS GRAFT  03-23-92  . CORONARY ARTERY BYPASS GRAFT N/A 08/14/2012   Procedure: REDO CORONARY ARTERY BYPASS GRAFTING (CABG);  Surgeon: Gaye Pollack, MD;  Location: [facility];  Service: Open Heart Surgery;  Laterality: N/A;  . INTRAOPERATIVE TRANSESOPHAGEAL ECHOCARDIOGRAM N/A 08/14/2012   Procedure: INTRAOPERATIVE TRANSESOPHAGEAL ECHOCARDIOGRAM;  Surgeon: Gaye Pollack, MD;  Location: [facility];  Service: Open Heart Surgery;  Laterality: N/A;  . LUMBAR LAMINECTOMY/DECOMPRESSION MICRODISCECTOMY Left 02/18/2015   Procedure: LUMBAR LAMINECTOMY/DECOMPRESSION MICRODISCECTOMY- LEFT FOUR-FIVE ;  Surgeon: Ashok Pall, MD;  Location: [facility];  Service: Neurosurgery;  Laterality: Left;     reports that he has been smoking cigarettes and pipe. He has a 60.00 pack-year smoking history. He has never used smokeless tobacco. He reports current alcohol use. He reports that he does not use drugs.  Allergies  Allergen Reactions  . Hydrochlorothiazide Other (See Comments)    Affected renal function.  "affected kidneys and calcium level"  Family History  Problem Relation Age of Onset  . Heart attack Father 66  . Stroke Brother 45  . Hypertension Brother   . Hyperlipidemia Brother     Prior to Admission medications   Medication Sig Start Date End Date Taking? Authorizing Provider  acetaminophen (TYLENOL) 500 MG tablet Take 1-2 tablets (500-1,000 mg total) by mouth every 6 (six) hours as needed for pain. 08/03/12  Yes Dunn, Nedra Hai, PA-C  Ascorbic Acid (VITAMIN C) 1000 MG tablet Take 1,000 mg by mouth daily.   Yes [provider]  aspirin 81 MG tablet Take 81 mg by mouth daily.   Yes [provider]  carvedilol (COREG) 12.5 MG tablet Take 1 tablet (12.5 mg total) by mouth 2 (two) times daily with a meal. 07/05/17  Yes Crenshaw, Denice Bors, MD  Cholecalciferol (VITAMIN D PO) Take 3,000 mg by mouth daily.   Yes [provider]  cilostazol (PLETAL) 100 MG tablet Take 1 tablet (100 mg total) by mouth 2 (two) times daily. 11/15/17  Yes Silverio Decamp, MD  Cyanocobalamin (VITAMIN B-12 PO) Take 1 tablet by mouth daily.   Yes [provider]  finasteride (PROSCAR) 5 MG tablet TAKE 1 TABLET BY MOUTH EVERY DAY Patient taking differently: Take 5 mg by mouth every evening.  10/10/17  Yes [name], MD  Fluticasone-Umeclidin-Vilant (TRELEGY ELLIPTA) 100-62.5-25 MCG/INH AEPB Inhale 1 Inhaler into the lungs daily. 01/09/18  Yes [name], MD  furosemide (LASIX) 40 MG tablet Take 1 tablet (40 mg total) by mouth daily. 07/05/17  Yes Lelon Perla, MD  ipratropium-albuterol (DUONEB) 0.5-2.5 (3) MG/3ML SOLN Take 3 mLs by nebulization every 2 (two) hours as needed (for SOB).    Yes [provider]  Multiple Vitamin (MULTIVITAMIN) capsule Take 1 capsule by mouth daily.   Yes [provider]  Probiotic Product (TRUBIOTICS PO) Take 1 tablet by mouth daily.   Yes [provider]  rosuvastatin (CRESTOR) 40 MG tablet Take 1 tablet (40 mg total) by mouth daily. 11/27/17  Yes Lelon Perla, MD  senna-docusate (SENOKOT-S) 8.6-50 MG tablet Take 1 tablet by mouth at bedtime as needed for mild constipation. 01/18/18  Yes Domenic Polite, MD  VENTOLIN HFA 108 (90 Base) MCG/ACT inhaler TAKE 2 PUFFS BY MOUTH EVERY 6 HOURS AS NEEDED FOR WHEEZE OR SHORTNESS OF BREATH Patient taking differently: Inhale 2 puffs into the lungs every 6 (six) hours as needed for wheezing.  12/28/17  Yes [name], MD  AMBULATORY NON FORMULARY MEDICATION Nebulizer - use as  needed - with supplies.  Diagnosis of COPD J44.1 09/19/17   [name], MD  AMBULATORY NON FORMULARY MEDICATION Knee-high, medium compression, graduated compression stockings. Apply to lower extremities. [email or web address], Zippered Compression Stockings, medium circ, long length 11/15/17   Silverio Decamp, MD  Nebulizer MISC Generic nebulizer plus supplies 03/26/15   Emeterio Reeve, DO    Physical Exam: Vitals:   03/05/18 2015 03/05/18 2045 03/05/18 2145 03/05/18 2215  BP: 122/73 127/70 132/73 106/73  Pulse: 94 94 92 98  Resp: (!) 29  (!) 21 (!) 24  Temp:      TempSrc:      SpO2: 90% 91% 95% 92%  Weight:      Height:          Constitutional: NAD, calm, comfortable Vitals:   03/05/18 2015 03/05/18 2045 03/05/18 2145 03/05/18 2215  BP: 122/73 127/70 132/73 106/73  Pulse: 94 94 92 98  Resp: (!) 29  (!) 21 (!) 24  Temp:      TempSrc:      SpO2: 90% 91% 95% 92%  Weight:      Height:       Eyes: PERRL, lids and conjunctivae normal ENMT: Mucous membranes are moist. Posterior pharynx clear of any exudate or lesions.Normal dentition.  Neck: normal, supple, no masses, no thyromegaly Respiratory: Diminished but clear to auscultation bilaterally, no wheezing, no crackles. Normal respiratory effort. No accessory muscle use.  Cardiovascular: Regular rate and rhythm, no murmurs / rubs / gallops. No extremity edema. 2+ pedal pulses. No carotid bruits.  Abdomen: no tenderness, no masses palpated. No hepatosplenomegaly. Bowel sounds positive.  Musculoskeletal: no clubbing / cyanosis. No joint deformity upper and lower extremities. Good ROM, no contractures. Normal muscle tone.  Skin: no rashes, lesions, ulcers. No induration Neurologic: CN 2-12 grossly intact. Sensation intact, DTR normal. Strength 5/5 in all 4.  Psychiatric: Normal judgment and insight. Alert and oriented x 3. Normal mood.    Labs on Admission: I have personally reviewed following labs and imaging  studies  CBC: Recent Labs  Lab 03/05/18 1636  WBC 7.5  NEUTROABS 4.7  HGB 12.2*  HCT 37.2*  MCV 91.9  PLT 8*   Basic Metabolic Panel: Recent Labs  Lab 03/05/18 1636 03/05/18 1752 03/05/18 2030  NA 141  --   --   K 2.8*  --   --   CL 104  --   --   CO2 24  --   --   GLUCOSE 106*  --   --   BUN 14  --   --   CREATININE 1.45* 1.40*  --   CALCIUM 9.7  --   --   MG  --   --  1.7   GFR: Estimated Creatinine Clearance: 46.3 mL/min (A) (by C-G formula based on SCr of 1.4 mg/dL (H)). Liver Function Tests: Recent Labs  Lab 03/05/18 1636  AST 106*  ALT 46*  ALKPHOS 139*  BILITOT 2.1*  PROT 5.3*  ALBUMIN 2.7*   No results for input(s): LIPASE, AMYLASE in the last 168 hours. No results for input(s): AMMONIA in the last 168 hours. Coagulation Profile: No results for input(s): INR, PROTIME in the last 168 hours. Cardiac Enzymes: No results for input(s): CKTOTAL, CKMB, CKMBINDEX, TROPONINI in the last 168 hours. BNP (last 3 results) No results for input(s): PROBNP in the last 8760 hours. HbA1C: No results for input(s): HGBA1C in the last 72 hours. CBG: No results for input(s): GLUCAP in the last 168 hours. Lipid Profile: No results for input(s): CHOL, HDL, LDLCALC, TRIG, CHOLHDL, LDLDIRECT in the last 72 hours. Thyroid Function Tests: No results for input(s): TSH, T4TOTAL, FREET4, T3FREE, THYROIDAB in the last 72 hours. Anemia Panel: No results for input(s): VITAMINB12, FOLATE, FERRITIN, TIBC, IRON, RETICCTPCT in the last 72 hours. Urine analysis:    Component Value Date/Time   COLORURINE YELLOW 03/05/2018 2038   APPEARANCEUR CLEAR 03/05/2018 2038   LABSPEC 1.023 03/05/2018 2038   PHURINE 6.0 03/05/2018 2038   GLUCOSEU NEGATIVE 03/05/2018 2038   HGBUR MODERATE (A) 03/05/2018 2038   BILIRUBINUR NEGATIVE 03/05/2018 2038   BILIRUBINUR neg 10/31/2011 1121   KETONESUR NEGATIVE 03/05/2018 2038   PROTEINUR NEGATIVE 03/05/2018 2038   UROBILINOGEN 2.0 (H) 08/09/2012  1316   NITRITE NEGATIVE 03/05/2018 2038   LEUKOCYTESUR NEGATIVE 03/05/2018 2038   Sepsis Labs: !!!!!!!!!!!!!!!!!!!!!!!!!!!!!!!!!!!!!!!!!!!! @LABRCNTIP (procalcitonin:4,lacticidven:4) )No results found for this or any previous visit (from the  past 240 hour(s)).   Radiological Exams on Admission: Dg Chest 2 View  Result Date: 03/05/2018 CLINICAL DATA:  Short of breath.  Recent pneumonia EXAM: CHEST - 2 VIEW COMPARISON:  None. FINDINGS: Sternotomy wires overlie normal cardiac silhouette. Normal pulmonary vasculature. No effusion, infiltrate, or pneumothorax. No acute osseous abnormality. IMPRESSION: No acute cardiopulmonary process. Electronically Signed   By: Suzy Bouchard M.D.   On: 03/05/2018 15:11   Ct Angio Chest/abd/pel For Dissection W And/or W/wo  Addendum Date: 03/05/2018   ADDENDUM REPORT: 03/05/2018 20:17 ADDENDUM: Correction: There are several omissions in the body of the chest CT report due to recent change from table mike to speech microphone. Under cardiovascular, it should state NO large central pulmonary embolus is noted. And likewise NO thoracic aortic aneurysm is identified. Under lungs/pleura common should state: As stated above, there is pulmonary consolidation in the superior segment of the left lower lobe abutting the pleura. A small right effusion and atelectasis is noted. Centrilobular emphysema is noted, upper lobe predominant. Findings discussed with Joseph Berg. Electronically Signed   By: Ashley Royalty M.D.   On: 03/05/2018 20:17   Result Date: 03/05/2018 CLINICAL DATA:  Dyspnea on exertion. Recently diagnosed 12 5 discharge 74 EXAM: CT ANGIOGRAPHY CHEST, ABDOMEN AND PELVIS TECHNIQUE: Multidetector CT imaging through the chest, abdomen and pelvis was performed using the standard protocol during bolus administration of intravenous contrast. Multiplanar reconstructed images and MIPs were obtained and reviewed to evaluate the vascular anatomy. CONTRAST:  122mL [id number]  IOPAMIDOL (ISOVUE-370) INJECTION 76% COMPARISON:  None. FINDINGS: CTA CHEST FINDINGS Cardiovascular: The unenhanced images through the chest demonstrate. Atherosclerosis of the thoracic aorta branch vessels are. Native main three-vessel coronary arteriosclerosis is noted with post CABG change present heart size is top normal effusion After IV contrast administration thoracic aortic aneurysm is identified. Linear streak artifacts are noted within the common carotid and subclavian veins emanating hyperdense contrast the adjacent left subclavian conclusive evidence dissection. Large central embolus is noted though the study is not tailored toward assessment emboli Mediastinum/Nodes: Normal sized thyroid gland without dominant mass. Mildly enlarged left tracheobronchial lymph nodes measuring up to 12 mm short axis larger 2 cm short axis left hilar soft tissue density/lymph nodes noted. These may be reactive given what appear to pulmonary consolidations in the superior segment of the left lower lobe compatible with pneumonia and/or atelectasis. Patent trachea and mainstem bronchi. The CT appearance of the esophagus is unremarkable. Lungs/Pleura: As stated above, there is pulmonary consolidation in superior segment of the lower lobe abutting the small right effusion and atelectasis. Centrilobular emphysema is, upper lobe. No pneumothorax. Musculoskeletal: No chest wall abnormality. No acute or significant osseous findings. Mild degenerative change along the thoracic spine. Median sternotomy sutures are in place. Review of the MIP images confirms the above findings. CTA ABDOMEN AND PELVIS FINDINGS VASCULAR Aorta: 4.5 cm infrarenal abdominal aortic aneurysm crescentic soft plaque along the posterior and left lateral wall series 8/92. Dissection. Celiac: Minimal atherosclerosis at the origin celiac axis patent branch vessels with atherosclerosis along the splenic artery dissection, aneurysm or significant stenosis. SMA:  Patent with atherosclerotic origins. Occlusion or dissection aneurysm Renals: Single bilateral renal arteries atherosclerosis but fibromuscular dysplasia, stenosis, aneurysm or dissection identified. IMA: Patent Inflow: Patent without evidence of aneurysm, dissection, vasculitis or significant stenosis. Atherosclerosis common iliac arteries probable chronic calcified dissection involving the proximal left common iliac artery, series 8/233. Veins: No obvious venous abnormality within the limitations of this arterial phase study. Review of the MIP  images confirms the above findings. NON-VASCULAR Hepatobiliary: No focal liver abnormality is seen. No gallstones, gallbladder wall thickening, or biliary dilatation. Pancreas: Mild ectasia of the pancreatic duct. No inflammation or dominant enhancing mass. Spleen: Normal Adrenals/Urinary Tract: Normal bilateral adrenal glands. Bilateral renal cysts without obstructive uropathy. No solid enhancing masses are noted. The dependent layering bladder calculi are identified. Diverticulosis of the bladder is also noted at the dome of the bladder. Stomach/Bowel: Decompressed stomach with normal duodenal sweep and ligament of Treitz position. No acute bowel obstruction. Extensive colonic diverticulosis is noted along the descending sigmoid colon. Suggestion mild pericolonic inflammation along the mid sigmoid raises the possibility of a subtle sigmoid diverticulitis, series 8/250. Lymphatic: No chest, abdomen or pelvic adenopathy. Reproductive: Mildly enlarged prostate with peripheral zone calcifications. Other: No free air or free fluid. Musculoskeletal: Lumbar spondylosis with multilevel degenerative disc and endplate changes. Schmorl's node involving the superior endplate of L3. Review of the MIP images confirms the above findings. IMPRESSION: 1. 4.5 cm infrarenal abdominal aortic aneurysm with crescentic soft plaque along the posterior and left lateral wall. Patent branch vessels  with atherosclerosis 2. No thoracic aortic aneurysm or dissection. No acute pulmonary embolus. 3. Left lower lobe pneumonia with with mediastinal and left hilar reactive adenopathy. Follow-up to assure resolution is suggested. 4. Bilateral renal cysts. 5. Descending and sigmoid colonic diverticulosis with subtle pericolonic fatty induration along the mid sigmoid raises the possibility of a mild uncomplicated sigmoid diverticulitis. Electronically Signed: By: Ashley Royalty M.D. On: 03/05/2018 19:57    Old chart reviewed Case discussed with Ms. Joseph Berg in the ED Chest x-ray reviewed no edema or infiltrate noted  Assessment/Plan 85 year old male with acute hypoxic respiratory failure likely secondary to recurrent pneumonia Principal Problem:   PNA (pneumonia)-patient was treated with ceftriaxone azithromycin last month and transition to oral Omnicef.  Patient clinically improved with that bout of pneumonia and had return to normal into the last 2 to 3 days.  Will check respiratory viral panel.  Will obtain blood and sputum cultures.  Placed on Rocephin doxy and Flagyl to also cover below possible diverticulitis.  Provide supplemental oxygen and wean as tolerates.  I have also ordered speech therapy evaluation.  Active Problems:   Acute respiratory failure with hypoxia (HCC)-secondary to the above    Thrombocytopenia (HCC)-unclear etiology but profoundly decreased at this time with a platelet count of 8 was over 150 last month.  Hold all antiplatelet and anticoagulants at this time.  Hematology is following Dr. Lyla Son appreciate his input.    Diverticulitis possible-patient is completely asymptomatic of this but covering as above.  Noted on CT.    CAD (coronary artery disease) of artery bypass graft-stable    Chronic systolic congestive heart failure (HCC)-stable and compensated at this time    AAA (abdominal aortic aneurysm) without rupture (HCC)-stable      DVT prophylaxis: Ambulate Code  Status: Full Family Communication: Wife Disposition Plan: Days Consults called: Hematology Admission status: Admission   Kaily Wragg A MD [facility]  If 7PM-7AM, please contact night-coverage [email or web address] Password [facility]  03/05/2018, 10:33 PM

## 2018-03-05 NOTE — ED Provider Notes (Signed)
[facility] EMERGENCY DEPARTMENT Provider Note   CSN: [id number] Arrival date & time: 03/05/18  1604     History   Chief Complaint Chief Complaint  Patient presents with  . Shortness of Breath    HPI Joseph Berg is a 85 y.o. male with a past medical history of CAD that is post CABG, CHF, emphysema, OSA on CPAP, AAA who presents today for evaluation of shortness of breath with exertion.  He reports that for the past day he has been more short of breath than usual when he walks.  He denies any fevers, chest pain.  He reports that he was at physical therapy today when they noted that his oxygen was consistently low.  He normally runs 93 to 94% and has not used oxygen at home in over a month.  Today his oxygen was running around 88%.  He was sent to urgent care who had a chest x-ray and sent him here.  He reports that over the past 3 days he has had worsening low back pain.  When he lays down it does not hurt however when he gets up and walks around it is 8 out of 10.  He has an appointment tomorrow with Dr. Stanford Breed, his cardiologist, to see if his known AAA is contributing to this pain.  He reports normally people have no problem finding pulses in his feet.   HPI  Past Medical History:  Diagnosis Date  . BPH (benign prostatic hyperplasia)   . CAD (coronary artery disease)    a. s/p CABG x1 with LIMA-LAD 1994. b. LM & 3v CAD by cath 07/2012  . CHF (congestive heart failure) ([place])    a. EF 35-40% by echo 07/2012.  [name] Complication of anesthesia    affected patients memory. Pt stated "I couldn't remember anything for three months...just bits and pieces"  . Emphysema    a. Moderate emphysema by CT 06/2012.  [name] Heart attack ([place]) 1994  . Hyperlipidemia   . Hypertension   . Hypoparathyroidism ([place])   . NSVT (nonsustained ventricular tachycardia) ([place])    a. Seen on tele 07/2012.  . OSA on CPAP    pt stated he does not wear CPAP because he no longer has sleep apnea    . Pneumonia   . Pseudoaneurysm of left femoral artery ([place])    a. post cath s/p compression 07/2012, associated w/ anemia.  . Sciatica 2012   Qualifier: Diagnosis of  By: Madilyn Fireman MD, [place]      Patient Active Problem List   Diagnosis Date Noted  . Acute on chronic combined systolic and diastolic CHF (congestive heart failure) ([place]) 01/12/2018  . Multifocal pneumonia 01/11/2018  . Primary osteoarthritis of right shoulder 01/27/2017  . Bilateral calf pain 01/27/2017  . Trochanteric bursitis of both hips 12/28/2016  . Demand ischemia ([place])   . AKI (acute kidney injury) ([place]) 03/17/2016  . Hyperglycemia 03/17/2016  . Postlaminectomy syndrome, lumbar region 12/14/2015  . COPD exacerbation ([place]) 03/27/2015  . HNP (herniated nucleus pulposus), lumbar 02/18/2015  . Claudication of right lower extremity ([place]) 12/09/2014  . Lumbar degenerative disc disease 05/13/2014  . Mixed restrictive and obstructive lung disease ([place]) 01/16/2014  . Cerebrovascular disease 06/26/2013  . AAA (abdominal aortic aneurysm) without rupture ([place]) 06/24/2013  . Aortic calcification ([place]) 06/10/2013  . S/P CABG x 2 08/17/2012  . Cardiomyopathy, ischemic 07/25/2012  . Chronic systolic congestive heart failure ([place]) 07/11/2012  . CAD (coronary artery disease) of artery  bypass graft 07/06/2012  . OSA (obstructive sleep apnea) 01/24/2012  . BPH (benign prostatic hyperplasia) 03/02/2011  . History of tobacco abuse 08/23/2010  . Hypoparathyroidism ([place]) 07/30/2010  . SCIATICA 11/10/2009  . INSULIN RESISTANCE SYNDROME 04/16/2007  . HYPERLIPIDEMIA NEC/NOS 11/14/2006  . HYPERTENSION, BENIGN ESSENTIAL 11/14/2006    Past Surgical History:  Procedure Laterality Date  . CATARACT EXTRACTION, BILATERAL    . COLONOSCOPY    . CORONARY ARTERY BYPASS GRAFT  03-23-92  . CORONARY ARTERY BYPASS GRAFT N/A 08/14/2012   Procedure: REDO CORONARY ARTERY BYPASS GRAFTING (CABG);  Surgeon: Gaye Pollack, MD;  Location: [facility];   Service: Open Heart Surgery;  Laterality: N/A;  . INTRAOPERATIVE TRANSESOPHAGEAL ECHOCARDIOGRAM N/A 08/14/2012   Procedure: INTRAOPERATIVE TRANSESOPHAGEAL ECHOCARDIOGRAM;  Surgeon: Gaye Pollack, MD;  Location: [facility];  Service: Open Heart Surgery;  Laterality: N/A;  . LUMBAR LAMINECTOMY/DECOMPRESSION MICRODISCECTOMY Left 02/18/2015   Procedure: LUMBAR LAMINECTOMY/DECOMPRESSION MICRODISCECTOMY- LEFT FOUR-FIVE ;  Surgeon: Ashok Pall, MD;  Location: [facility];  Service: Neurosurgery;  Laterality: Left;        Home Medications    Prior to Admission medications   Medication Sig Start Date End Date Taking? Authorizing Provider  acetaminophen (TYLENOL) 500 MG tablet Take 1-2 tablets (500-1,000 mg total) by mouth every 6 (six) hours as needed for pain. 08/03/12   Dunn, Nedra Hai, PA-C  AMBULATORY NON FORMULARY MEDICATION Nebulizer - use as needed - with supplies.  Diagnosis of COPD J44.1 09/19/17   [name], MD  AMBULATORY NON FORMULARY MEDICATION Knee-high, medium compression, graduated compression stockings. Apply to lower extremities. [email or web address], Zippered Compression Stockings, medium circ, long length 11/15/17   Silverio Decamp, MD  Ascorbic Acid (VITAMIN C) 1000 MG tablet Take 1,000 mg by mouth daily.    [provider]  aspirin 81 MG tablet Take 81 mg by mouth daily.    [provider]  carvedilol (COREG) 12.5 MG tablet Take 1 tablet (12.5 mg total) by mouth 2 (two) times daily with a meal. 07/05/17   Crenshaw, Denice Bors, MD  Cholecalciferol (VITAMIN D PO) Take 3,000 mg by mouth daily.    [provider]  cilostazol (PLETAL) 100 MG tablet Take 1 tablet (100 mg total) by mouth 2 (two) times daily. 11/15/17   Silverio Decamp, MD  Cyanocobalamin (VITAMIN B-12 PO) Take 1 tablet by mouth daily.    [provider]  finasteride (PROSCAR) 5 MG tablet TAKE 1 TABLET BY MOUTH EVERY DAY Patient taking differently: Take 5 mg by mouth  daily.  10/10/17   [name], MD  Fluticasone-Umeclidin-Vilant (TRELEGY ELLIPTA) 100-62.5-25 MCG/INH AEPB Inhale 1 Inhaler into the lungs daily. 01/09/18   [name], MD  furosemide (LASIX) 40 MG tablet Take 1 tablet (40 mg total) by mouth daily. 07/05/17   Lelon Perla, MD  ipratropium-albuterol (DUONEB) 0.5-2.5 (3) MG/3ML SOLN Take 3 mLs by nebulization every 2 (two) hours as needed.    [provider]  Multiple Vitamin (MULTIVITAMIN) capsule Take 1 capsule by mouth daily.    [provider]  Nebulizer MISC Generic nebulizer plus supplies 03/26/15   Emeterio Reeve, DO  Probiotic Product (TRUBIOTICS PO) Take 1 tablet by mouth daily.    [provider]  rosuvastatin (CRESTOR) 40 MG tablet Take 1 tablet (40 mg total) by mouth daily. 11/27/17   Lelon Perla, MD  senna-docusate (SENOKOT-S) 8.6-50 MG tablet Take 1 tablet by mouth at bedtime as needed for mild constipation. 01/18/18  Domenic Polite, MD  VENTOLIN HFA 108 (90 Base) MCG/ACT inhaler TAKE 2 PUFFS BY MOUTH EVERY 6 HOURS AS NEEDED FOR WHEEZE OR SHORTNESS OF BREATH Patient taking differently: Inhale 2 puffs into the lungs every 6 (six) hours as needed.  12/28/17   [name], MD    Family History Family History  Problem Relation Age of Onset  . Heart attack Father 69  . Stroke Brother 72  . Hypertension Brother   . Hyperlipidemia Brother     Social History Social History   Tobacco Use  . Smoking status: Current Some Day Smoker    Packs/day: 1.00    Years: 60.00    Pack years: 60.00    Types: Cigarettes, Pipe  . Smokeless tobacco: Never Used  . Tobacco comment: 01/11/2018 "smokes cigars only now"  Substance Use Topics  . Alcohol use: Yes    Comment: Occasional  . Drug use: No     Allergies   Hydrochlorothiazide   Review of Systems Review of Systems  Constitutional: Negative for chills and fever.  HENT: Negative for congestion.   Respiratory:  Positive for shortness of breath. Negative for cough and chest tightness.   Cardiovascular: Negative for chest pain, palpitations and leg swelling.  Gastrointestinal: Negative for abdominal pain, nausea and vomiting.  Genitourinary: Negative for dysuria and urgency.  Musculoskeletal: Positive for back pain. Negative for neck pain.  Skin: Negative for color change and rash.  Neurological: Negative for weakness and headaches.  Psychiatric/Behavioral: Negative for confusion. The patient is not nervous/anxious.   All other systems reviewed and are negative.    Physical Exam Updated Vital Signs BP 127/70   Pulse 94   Temp 98.5 F (36.9 C) (Oral)   Resp (!) 29   Ht _0  (1.753 m)   Wt 95.3 kg   SpO2 91%   BMI 31.01 kg/m   Physical Exam Vitals signs and nursing note reviewed.  Constitutional:      General: He is not in acute distress.    Appearance: He is well-developed.  HENT:     Head: Normocephalic and atraumatic.  Eyes:     Conjunctiva/sclera: Conjunctivae normal.  Neck:     Musculoskeletal: Normal range of motion and neck supple.  Cardiovascular:     Rate and Rhythm: Normal rate and regular rhythm.     Pulses:          Radial pulses are 2+ on the right side and 2+ on the left side.       Dorsalis pedis pulses are detected w/ Doppler on the right side and 2+ on the left side.       Posterior tibial pulses are detected w/ Doppler on the right side and 2+ on the left side.     Heart sounds: Normal heart sounds. No murmur.  Pulmonary:     Effort: Pulmonary effort is normal. No respiratory distress.     Breath sounds: Examination of the left-lower field reveals decreased breath sounds. Decreased breath sounds present.  Chest:     Chest wall: No tenderness.  Abdominal:     Palpations: Abdomen is soft.     Tenderness: There is no abdominal tenderness.  Musculoskeletal:     Right lower leg: He exhibits no tenderness. No edema.     Left lower leg: He exhibits no tenderness.  No edema.  Skin:    General: Skin is warm and dry.  Neurological:     General: No focal deficit present.  Mental Status: He is alert.     Cranial Nerves: No cranial nerve deficit.  Psychiatric:        Mood and Affect: Mood normal.        Behavior: Behavior normal.      ED Treatments / Results  Labs (all labs ordered are listed, but only abnormal results are displayed) Labs Reviewed  CBC WITH DIFFERENTIAL/PLATELET - Abnormal; Notable for the following components:      Result Value   RBC 4.05 (*)    Hemoglobin 12.2 (*)    HCT 37.2 (*)    Platelets 8 (*)    nRBC 1.5 (*)    Abs Immature Granulocytes 0.44 (*)    All other components within normal limits  COMPREHENSIVE METABOLIC PANEL - Abnormal; Notable for the following components:   Potassium 2.8 (*)    Glucose, Bld 106 (*)    Creatinine, Ser 1.45 (*)    Total Protein 5.3 (*)    Albumin 2.7 (*)    AST 106 (*)    ALT 46 (*)    Alkaline Phosphatase 139 (*)    Total Bilirubin 2.1 (*)    GFR calc non Af Amer 45 (*)    GFR calc Af Amer 52 (*)    All other components within normal limits  I-STAT CREATININE, ED - Abnormal; Notable for the following components:   Creatinine, Ser 1.40 (*)    All other components within normal limits  URINE CULTURE  CULTURE, BLOOD (ROUTINE X 2)  CULTURE, BLOOD (ROUTINE X 2)  URINALYSIS, ROUTINE W REFLEX MICROSCOPIC  MAGNESIUM  PLATELET COUNT  SAVE SMEAR (SSMR)  APTT  PROTIME-INR  FIBRINOGEN  I-STAT TROPONIN, ED  TYPE AND SCREEN    EKG EKG Interpretation  Date/Time:  Monday March 05 2018 16:09:33 EST Ventricular Rate:  88 PR Interval:    QRS Duration: 164 QT Interval:  424 QTC Calculation: 513 R Axis:   85 Text Interpretation:  Sinus rhythm Ventricular premature complex Right bundle branch block Inferior infarct, age indeterminate ST depr, consider ischemia, anterolateral lds No significant change since last tracing Confirmed by Fredia Sorrow [phone]) on 03/05/2018 5:01:29  PM   Radiology Dg Chest 2 View  Result Date: 03/05/2018 CLINICAL DATA:  Short of breath.  Recent pneumonia EXAM: CHEST - 2 VIEW COMPARISON:  None. FINDINGS: Sternotomy wires overlie normal cardiac silhouette. Normal pulmonary vasculature. No effusion, infiltrate, or pneumothorax. No acute osseous abnormality. IMPRESSION: No acute cardiopulmonary process. Electronically Signed   By: Suzy Bouchard M.D.   On: 03/05/2018 15:11   Ct Angio Chest/abd/pel For Dissection W And/or W/wo  Addendum Date: 03/05/2018   ADDENDUM REPORT: 03/05/2018 20:17 ADDENDUM: Correction: There are several omissions in the body of the chest CT report due to recent change from table mike to speech microphone. Under cardiovascular, it should state NO large central pulmonary embolus is noted. And likewise NO thoracic aortic aneurysm is identified. Under lungs/pleura common should state: As stated above, there is pulmonary consolidation in the superior segment of the left lower lobe abutting the pleura. A small right effusion and atelectasis is noted. Centrilobular emphysema is noted, upper lobe predominant. Findings discussed with Wyn Quaker. Electronically Signed   By: Ashley Royalty M.D.   On: 03/05/2018 20:17   Result Date: 03/05/2018 CLINICAL DATA:  Dyspnea on exertion. Recently diagnosed 12 5 discharge 58 EXAM: CT ANGIOGRAPHY CHEST, ABDOMEN AND PELVIS TECHNIQUE: Multidetector CT imaging through the chest, abdomen and pelvis was performed using the standard protocol during  bolus administration of intravenous contrast. Multiplanar reconstructed images and MIPs were obtained and reviewed to evaluate the vascular anatomy. CONTRAST:  141m ISOVUE-370 IOPAMIDOL (ISOVUE-370) INJECTION 76% COMPARISON:  None. FINDINGS: CTA CHEST FINDINGS Cardiovascular: The unenhanced images through the chest demonstrate. Atherosclerosis of the thoracic aorta branch vessels are. Native main three-vessel coronary arteriosclerosis is noted with post  CABG change present heart size is top normal effusion After IV contrast administration thoracic aortic aneurysm is identified. Linear streak artifacts are noted within the common carotid and subclavian veins emanating hyperdense contrast the adjacent left subclavian conclusive evidence dissection. Large central embolus is noted though the study is not tailored toward assessment emboli Mediastinum/Nodes: Normal sized thyroid gland without dominant mass. Mildly enlarged left tracheobronchial lymph nodes measuring up to 12 mm short axis larger 2 cm short axis left hilar soft tissue density/lymph nodes noted. These may be reactive given what appear to pulmonary consolidations in the superior segment of the left lower lobe compatible with pneumonia and/or atelectasis. Patent trachea and mainstem bronchi. The CT appearance of the esophagus is unremarkable. Lungs/Pleura: As stated above, there is pulmonary consolidation in superior segment of the lower lobe abutting the small right effusion and atelectasis. Centrilobular emphysema is, upper lobe. No pneumothorax. Musculoskeletal: No chest wall abnormality. No acute or significant osseous findings. Mild degenerative change along the thoracic spine. Median sternotomy sutures are in place. Review of the MIP images confirms the above findings. CTA ABDOMEN AND PELVIS FINDINGS VASCULAR Aorta: 4.5 cm infrarenal abdominal aortic aneurysm crescentic soft plaque along the posterior and left lateral wall series 8/92. Dissection. Celiac: Minimal atherosclerosis at the origin celiac axis patent branch vessels with atherosclerosis along the splenic artery dissection, aneurysm or significant stenosis. SMA: Patent with atherosclerotic origins. Occlusion or dissection aneurysm Renals: Single bilateral renal arteries atherosclerosis but fibromuscular dysplasia, stenosis, aneurysm or dissection identified. IMA: Patent Inflow: Patent without evidence of aneurysm, dissection, vasculitis or  significant stenosis. Atherosclerosis common iliac arteries probable chronic calcified dissection involving the proximal left common iliac artery, series 8/233. Veins: No obvious venous abnormality within the limitations of this arterial phase study. Review of the MIP images confirms the above findings. NON-VASCULAR Hepatobiliary: No focal liver abnormality is seen. No gallstones, gallbladder wall thickening, or biliary dilatation. Pancreas: Mild ectasia of the pancreatic duct. No inflammation or dominant enhancing mass. Spleen: Normal Adrenals/Urinary Tract: Normal bilateral adrenal glands. Bilateral renal cysts without obstructive uropathy. No solid enhancing masses are noted. The dependent layering bladder calculi are identified. Diverticulosis of the bladder is also noted at the dome of the bladder. Stomach/Bowel: Decompressed stomach with normal duodenal sweep and ligament of Treitz position. No acute bowel obstruction. Extensive colonic diverticulosis is noted along the descending sigmoid colon. Suggestion mild pericolonic inflammation along the mid sigmoid raises the possibility of a subtle sigmoid diverticulitis, series 8/250. Lymphatic: No chest, abdomen or pelvic adenopathy. Reproductive: Mildly enlarged prostate with peripheral zone calcifications. Other: No free air or free fluid. Musculoskeletal: Lumbar spondylosis with multilevel degenerative disc and endplate changes. Schmorl's node involving the superior endplate of L3. Review of the MIP images confirms the above findings. IMPRESSION: 1. 4.5 cm infrarenal abdominal aortic aneurysm with crescentic soft plaque along the posterior and left lateral wall. Patent branch vessels with atherosclerosis 2. No thoracic aortic aneurysm or dissection. No acute pulmonary embolus. 3. Left lower lobe pneumonia with with mediastinal and left hilar reactive adenopathy. Follow-up to assure resolution is suggested. 4. Bilateral renal cysts. 5. Descending and sigmoid  colonic diverticulosis with subtle pericolonic  fatty induration along the mid sigmoid raises the possibility of a mild uncomplicated sigmoid diverticulitis. Electronically Signed: By: Ashley Royalty M.D. On: 03/05/2018 19:57    Procedures .Critical Care Performed by: Lorin Glass, PA-C Authorized by: Lorin Glass, PA-C   Critical care provider statement:    Critical care time (minutes):  45   Critical care time was exclusive of:  Separately billable procedures and treating other patients and teaching time   Critical care was necessary to treat or prevent imminent or life-threatening deterioration of the following conditions:  Sepsis, circulatory failure and respiratory failure   Critical care was time spent personally by me on the following activities:  Discussions with consultants, evaluation of patient's response to treatment, examination of patient, ordering and performing treatments and interventions, ordering and review of laboratory studies, ordering and review of radiographic studies, pulse oximetry, re-evaluation of patient's condition, obtaining history from patient or surrogate and review of old charts   (including critical care time)   Medications Ordered in ED Medications  iopamidol (ISOVUE-370) 76 % injection (has no administration in time range)  cefTRIAXone (ROCEPHIN) 2 g in sodium chloride 0.9 % 100 mL IVPB (2 g Intravenous New Bag/Given 03/05/18 2058)    And  metroNIDAZOLE (FLAGYL) IVPB 500 mg (has no administration in time range)  ipratropium-albuterol (DUONEB) 0.5-2.5 (3) MG/3ML nebulizer solution 3 mL (3 mLs Nebulization Given 03/05/18 1728)  iopamidol (ISOVUE-370) 76 % injection 100 mL (100 mLs Intravenous Contrast Given 03/05/18 1849)  doxycycline (VIBRA-TABS) tablet 100 mg (100 mg Oral Given 03/05/18 2056)  potassium chloride SA (K-DUR,KLOR-CON) CR tablet 40 mEq (40 mEq Oral Given 03/05/18 2056)     Initial Impression / Assessment and Plan / ED Course  I  have reviewed the triage vital signs and the nursing notes.  Pertinent labs & imaging results that were available during my care of the patient were reviewed by me and considered in my medical decision making (see chart for details).  Clinical Course as of Mar 06 2107  Mon Mar 05, 2018  1811 Called CT to expedite scan.   [EH]  1841 Platelets(!!): 8 [EH]  1847 WBC Morphology: MILD LEFT SHIFT (1-5% METAS, OCC MYELO, OCC BANDS) [EH]  1948 Called radiology to get scan read, Dr. Is reading it now.    [EH]  2010 Spoke with Dr. Randel Pigg from radiology regarding suspected dictation ears and CT angio read.  He reports that there is the pneumonia without thoracic aortic aneurysm, AAA present without evidence of dissection, states he will correct the read.  Also diverticulitis.    [EH]  2058 Spoke with hematologist on-call, recommends repeating platelet count which has already been ordered.  I had already ordered fibrinogen, PT/INR, PTT and saved smear which he agrees with.  He does not have any specific hospital admission preference.   [EH]  2108 Spoke with hematologist who will come see patient.   [EH]    Clinical Course User Index [EH] Lorin Glass, PA-C   Patient presents today for evaluation of hypoxia and shortness of breath worsening over the past 3 days.  During this time he has also had worsening lower back pain and a history of a known AAA.  Chest x-ray was obtained from outside urgent care and reviewed without evidence of acute abnormality.  Difficulty obtained in finding pulses on right foot, had to be dopplered as opposed to left foot where they were briskly palpable.  He reports increasing lower back pain.  Given this  combination with known AAA CT angio, chest abdomen pelvis for dissection was ordered.  I-STAT creatinine was obtained which was slightly elevated, however still able to obtain scan.  CT scan shows a left lower lobe pneumonia with suspected reactive adenopathy in addition to  concerns for diverticulitis.  I suspect that his back pain is primarily caused by this diverticulitis.  EKG shows a prolonged QTC without evidence of acute abnormalities.  Based on prolonged QTC patient's antibiotics will be Rocephin, both for pneumonia and diverticulitis, with doxy for pneumonia, and Flagyl for diverticulitis.    Labs were obtained and reviewed, hemoglobin is slightly low at 12.2, he does not have a significant leukocytosis with a white count of 7.5.  His platelets are markedly low at 8 with an RBC count of 1.5.  Morphology shows a mild left shift.  CMP significant for hypokalemia with a potassium of 2.8.  Alk phos is elevated at 139.  Total bilirubin, AST, and ALT are all slightly elevated.  I spoke with hospitalist who requested hematology consult.  I spoke with hematology who states they will be in to see the patient.    Final Clinical Impressions(s) / ED Diagnoses   Final diagnoses:  Community acquired pneumonia of left lower lobe of lung ([place])  Diverticulitis  Prolonged Q-T interval on ECG  Thrombocytopenia ([facility])  Hypoxic  AAA (abdominal aortic aneurysm) without rupture [facility])    ED Discharge Orders    None       Ollen Gross 03/06/18 0010    Fredia Sorrow, MD 03/06/18 [phone]

## 2018-03-05 NOTE — ED Triage Notes (Signed)
Per EMS: Pt from urgent care with c/o shob with exertion.  Pt recently diagnosed with PNA (12/5) and discharged home with oxygen.  Pt SP02 in office 87% on RA and increased work of breathing.

## 2018-03-06 ENCOUNTER — Ambulatory Visit: Payer: Medicare PPO | Admitting: Cardiology

## 2018-03-06 ENCOUNTER — Encounter (HOSPITAL_COMMUNITY): Payer: Self-pay | Admitting: Physician Assistant

## 2018-03-06 DIAGNOSIS — J189 Pneumonia, unspecified organism: Secondary | ICD-10-CM

## 2018-03-06 DIAGNOSIS — I5022 Chronic systolic (congestive) heart failure: Secondary | ICD-10-CM

## 2018-03-06 DIAGNOSIS — I25700 Atherosclerosis of coronary artery bypass graft(s), unspecified, with unstable angina pectoris: Secondary | ICD-10-CM

## 2018-03-06 LAB — CBC WITH DIFFERENTIAL/PLATELET
Abs Immature Granulocytes: 0.38 10*3/uL — ABNORMAL HIGH (ref 0.00–0.07)
Basophils Absolute: 0 10*3/uL (ref 0.0–0.1)
Basophils Relative: 0 %
EOS PCT: 1 %
Eosinophils Absolute: 0.1 10*3/uL (ref 0.0–0.5)
HCT: 36.1 % — ABNORMAL LOW (ref 39.0–52.0)
Hemoglobin: 11.6 g/dL — ABNORMAL LOW (ref 13.0–17.0)
Immature Granulocytes: 5 %
Lymphocytes Relative: 25 %
Lymphs Abs: 1.9 10*3/uL (ref 0.7–4.0)
MCH: 28.8 pg (ref 26.0–34.0)
MCHC: 32.1 g/dL (ref 30.0–36.0)
MCV: 89.6 fL (ref 80.0–100.0)
MONO ABS: 0.7 10*3/uL (ref 0.1–1.0)
Monocytes Relative: 9 %
Neutro Abs: 4.8 10*3/uL (ref 1.7–7.7)
Neutrophils Relative %: 60 %
Platelets: 7 10*3/uL — CL (ref 150–400)
RBC: 4.03 MIL/uL — ABNORMAL LOW (ref 4.22–5.81)
RDW: 14.6 % (ref 11.5–15.5)
WBC: 7.9 10*3/uL (ref 4.0–10.5)
nRBC: 1.1 % — ABNORMAL HIGH (ref 0.0–0.2)

## 2018-03-06 LAB — COMPREHENSIVE METABOLIC PANEL
ALT: 49 U/L — ABNORMAL HIGH (ref 0–44)
ANION GAP: 15 (ref 5–15)
AST: 111 U/L — ABNORMAL HIGH (ref 15–41)
Albumin: 2.9 g/dL — ABNORMAL LOW (ref 3.5–5.0)
Alkaline Phosphatase: 156 U/L — ABNORMAL HIGH (ref 38–126)
BUN: 15 mg/dL (ref 8–23)
CO2: 26 mmol/L (ref 22–32)
Calcium: 10.5 mg/dL — ABNORMAL HIGH (ref 8.9–10.3)
Chloride: 102 mmol/L (ref 98–111)
Creatinine, Ser: 1.58 mg/dL — ABNORMAL HIGH (ref 0.61–1.24)
GFR calc Af Amer: 47 mL/min — ABNORMAL LOW (ref >60)
GFR calc non Af Amer: 40 mL/min — ABNORMAL LOW (ref >60)
Glucose, Bld: 113 mg/dL — ABNORMAL HIGH (ref 70–99)
Potassium: 3.2 mmol/L — ABNORMAL LOW (ref 3.5–5.1)
Sodium: 143 mmol/L (ref 135–145)
Total Bilirubin: 2 mg/dL — ABNORMAL HIGH (ref 0.3–1.2)
Total Protein: 5.7 g/dL — ABNORMAL LOW (ref 6.5–8.1)

## 2018-03-06 LAB — VITAMIN B12: Vitamin B-12: 1696 pg/mL — ABNORMAL HIGH (ref 180–914)

## 2018-03-06 LAB — RESPIRATORY PANEL BY PCR
Adenovirus: NOT DETECTED
Bordetella pertussis: NOT DETECTED
Chlamydophila pneumoniae: NOT DETECTED
Coronavirus 229E: NOT DETECTED
Coronavirus HKU1: NOT DETECTED
Coronavirus NL63: NOT DETECTED
Coronavirus OC43: NOT DETECTED
Influenza A: NOT DETECTED
Influenza B: NOT DETECTED
Metapneumovirus: NOT DETECTED
Mycoplasma pneumoniae: NOT DETECTED
Parainfluenza Virus 1: NOT DETECTED
Parainfluenza Virus 2: NOT DETECTED
Parainfluenza Virus 3: NOT DETECTED
Parainfluenza Virus 4: NOT DETECTED
RHINOVIRUS / ENTEROVIRUS - RVPPCR: NOT DETECTED
Respiratory Syncytial Virus: NOT DETECTED

## 2018-03-06 LAB — MRSA PCR SCREENING: MRSA by PCR: NEGATIVE

## 2018-03-06 LAB — PLATELET COUNT: Platelets: 18 10*3/uL — CL (ref 150–400)

## 2018-03-06 LAB — STREP PNEUMONIAE URINARY ANTIGEN: Strep Pneumo Urinary Antigen: POSITIVE — AB

## 2018-03-06 LAB — HIV ANTIBODY (ROUTINE TESTING W REFLEX): HIV Screen 4th Generation wRfx: NONREACTIVE

## 2018-03-06 MED ORDER — UMECLIDINIUM BROMIDE 62.5 MCG/INH IN AEPB
1.0000 | INHALATION_SPRAY | Freq: Every day | RESPIRATORY_TRACT | Status: DC
  Administered 2018-03-07 – 2018-03-21 (×15): 1 via RESPIRATORY_TRACT
  Filled 2018-03-06 (×4): qty 7

## 2018-03-06 MED ORDER — POTASSIUM CHLORIDE CRYS ER 20 MEQ PO TBCR
40.0000 meq | EXTENDED_RELEASE_TABLET | Freq: Once | ORAL | Status: AC
  Administered 2018-03-06: 40 meq via ORAL
  Filled 2018-03-06: qty 2

## 2018-03-06 MED ORDER — SODIUM CHLORIDE 0.9% IV SOLUTION
Freq: Once | INTRAVENOUS | Status: AC
  Administered 2018-03-06: 15:00:00 via INTRAVENOUS

## 2018-03-06 MED ORDER — ORAL CARE MOUTH RINSE
15.0000 mL | Freq: Two times a day (BID) | OROMUCOSAL | Status: DC
  Administered 2018-03-06 – 2018-03-21 (×25): 15 mL via OROMUCOSAL

## 2018-03-06 MED ORDER — FLUTICASONE FUROATE-VILANTEROL 100-25 MCG/INH IN AEPB
1.0000 | INHALATION_SPRAY | Freq: Every day | RESPIRATORY_TRACT | Status: DC
  Administered 2018-03-07 – 2018-03-21 (×15): 1 via RESPIRATORY_TRACT
  Filled 2018-03-06 (×3): qty 28

## 2018-03-06 NOTE — H&P (Signed)
Chief Complaint: Thrombocytopenia  Referring Physician(s): Ladell Pier  Supervising Physician: Sandi Mariscal  Patient Status: [facility] - In-pt  History of Present Illness: [facility] is a 85 y.o. male who presented to the ED yesterday c/o dyspnea.  Labs showed a platelet count of 8,000.  He has no history of thrombocytopenia.  Labs in December were normal, platelets were 169 at that time.  We are asked to perform a bone marrow biopsy.  Past Medical History:  Diagnosis Date  . BPH (benign prostatic hyperplasia)   . CAD (coronary artery disease)    a. s/p CABG x1 with LIMA-LAD 1994. b. LM & 3v CAD by cath 07/2012  . CHF (congestive heart failure) ([place])    a. EF 35-40% by echo 07/2012.  [name] Complication of anesthesia    affected patients memory. Pt stated "I couldn't remember anything for three months...just bits and pieces"  . Emphysema    a. Moderate emphysema by CT 06/2012.  [name] Heart attack ([place]) 1994  . Hyperlipidemia   . Hypertension   . Hypoparathyroidism ([place])   . NSVT (nonsustained ventricular tachycardia) ([place])    a. Seen on tele 07/2012.  . OSA on CPAP    pt stated he does not wear CPAP because he no longer has sleep apnea  . Pneumonia   . Pseudoaneurysm of left femoral artery ([place])    a. post cath s/p compression 07/2012, associated w/ anemia.  . Sciatica 2012   Qualifier: Diagnosis of  By: Madilyn Fireman MD, Barnetta Chapel      Past Surgical History:  Procedure Laterality Date  . CATARACT EXTRACTION, BILATERAL    . COLONOSCOPY    . CORONARY ARTERY BYPASS GRAFT  03-23-92  . CORONARY ARTERY BYPASS GRAFT N/A 08/14/2012   Procedure: REDO CORONARY ARTERY BYPASS GRAFTING (CABG);  Surgeon: Gaye Pollack, MD;  Location: [facility];  Service: Open Heart Surgery;  Laterality: N/A;  . INTRAOPERATIVE TRANSESOPHAGEAL ECHOCARDIOGRAM N/A 08/14/2012   Procedure: INTRAOPERATIVE TRANSESOPHAGEAL ECHOCARDIOGRAM;  Surgeon: Gaye Pollack, MD;  Location: [facility];  Service: Open Heart  Surgery;  Laterality: N/A;  . LUMBAR LAMINECTOMY/DECOMPRESSION MICRODISCECTOMY Left 02/18/2015   Procedure: LUMBAR LAMINECTOMY/DECOMPRESSION MICRODISCECTOMY- LEFT FOUR-FIVE ;  Surgeon: Ashok Pall, MD;  Location: [facility];  Service: Neurosurgery;  Laterality: Left;    Allergies: Hydrochlorothiazide  Medications: Prior to Admission medications   Medication Sig Start Date End Date Taking? Authorizing Provider  acetaminophen (TYLENOL) 500 MG tablet Take 1-2 tablets (500-1,000 mg total) by mouth every 6 (six) hours as needed for pain. 08/03/12  Yes Dunn, Nedra Hai, PA-C  Ascorbic Acid (VITAMIN C) 1000 MG tablet Take 1,000 mg by mouth daily.   Yes [provider]  aspirin 81 MG tablet Take 81 mg by mouth daily.   Yes [provider]  carvedilol (COREG) 12.5 MG tablet Take 1 tablet (12.5 mg total) by mouth 2 (two) times daily with a meal. 07/05/17  Yes Crenshaw, Denice Bors, MD  Cholecalciferol (VITAMIN D PO) Take 3,000 mg by mouth daily.   Yes [provider]  cilostazol (PLETAL) 100 MG tablet Take 1 tablet (100 mg total) by mouth 2 (two) times daily. 11/15/17  Yes Silverio Decamp, MD  Cyanocobalamin (VITAMIN B-12 PO) Take 1 tablet by mouth daily.   Yes [provider]  finasteride (PROSCAR) 5 MG tablet TAKE 1 TABLET BY MOUTH EVERY DAY Patient taking differently: Take 5 mg by mouth every evening.  10/10/17  Yes [name],  MD  Fluticasone-Umeclidin-Vilant (TRELEGY ELLIPTA) 100-62.5-25 MCG/INH AEPB Inhale 1 Inhaler into the lungs daily. 01/09/18  Yes [name], MD  furosemide (LASIX) 40 MG tablet Take 1 tablet (40 mg total) by mouth daily. 07/05/17  Yes Lelon Perla, MD  ipratropium-albuterol (DUONEB) 0.5-2.5 (3) MG/3ML SOLN Take 3 mLs by nebulization every 2 (two) hours as needed (for SOB).    Yes [provider]  Multiple Vitamin (MULTIVITAMIN) capsule Take 1 capsule by mouth daily.   Yes [provider]  Probiotic  Product (TRUBIOTICS PO) Take 1 tablet by mouth daily.   Yes [provider]  rosuvastatin (CRESTOR) 40 MG tablet Take 1 tablet (40 mg total) by mouth daily. 11/27/17  Yes Lelon Perla, MD  senna-docusate (SENOKOT-S) 8.6-50 MG tablet Take 1 tablet by mouth at bedtime as needed for mild constipation. 01/18/18  Yes Domenic Polite, MD  VENTOLIN HFA 108 (90 Base) MCG/ACT inhaler TAKE 2 PUFFS BY MOUTH EVERY 6 HOURS AS NEEDED FOR WHEEZE OR SHORTNESS OF BREATH Patient taking differently: Inhale 2 puffs into the lungs every 6 (six) hours as needed for wheezing.  12/28/17  Yes [name], MD  AMBULATORY NON FORMULARY MEDICATION Nebulizer - use as needed - with supplies.  Diagnosis of COPD J44.1 09/19/17   [name], MD  AMBULATORY NON FORMULARY MEDICATION Knee-high, medium compression, graduated compression stockings. Apply to lower extremities. [email or web address], Zippered Compression Stockings, medium circ, long length 11/15/17   Silverio Decamp, MD  Nebulizer MISC Generic nebulizer plus supplies 03/26/15   Emeterio Reeve, DO     Family History  Problem Relation Age of Onset  . Heart attack Father 65  . Stroke Brother 21  . Hypertension Brother   . Hyperlipidemia Brother     Social History   Socioeconomic History  . Marital status: Married    Spouse name: Not on file  . Number of children: Not on file  . Years of education: Not on file  . Highest education level: Not on file  Occupational History  . Not on file  Social Needs  . Financial resource strain: Not on file  . Food insecurity:    Worry: Not on file    Inability: Not on file  . Transportation needs:    Medical: Not on file    Non-medical: Not on file  Tobacco Use  . Smoking status: Current Some Day Smoker    Packs/day: 1.00    Years: 60.00    Pack years: 60.00    Types: Cigarettes, Pipe  . Smokeless tobacco: Never Used  . Tobacco comment: 01/11/2018 "smokes cigars only now"    Substance and Sexual Activity  . Alcohol use: Yes    Comment: Occasional  . Drug use: No  . Sexual activity: Not on file    Comment: works part-time, [employer], [employer] completed H, married, no children, regular exercise.S  Lifestyle  . Physical activity:    Days per week: Not on file    Minutes per session: Not on file  . Stress: Not on file  Relationships  . Social connections:    Talks on phone: Not on file    Gets together: Not on file    Attends religious service: Not on file    Active member of club or organization: Not on file    Attends meetings of clubs or organizations: Not on file    Relationship status: Not on file  Other Topics Concern  . Not on file  Social  History Narrative  . Not on file     Review of Systems: A 12 point ROS discussed and pertinent positives are indicated in the HPI above.  All other systems are negative.  Review of Systems  Vital Signs: BP 140/76   Pulse (!) 104   Temp 97.9 F (36.6 C) (Oral)   Resp (!) 25   Ht 5' 9"  (1.753 m)   Wt 95.3 kg   SpO2 92%   BMI 31.01 kg/m   Physical Exam Vitals signs reviewed.  Constitutional:      Appearance: He is obese.  HENT:     Head: Normocephalic and atraumatic.  Eyes:     Extraocular Movements: Extraocular movements intact.  Cardiovascular:     Rate and Rhythm: Normal rate and regular rhythm.  Pulmonary:     Effort: Pulmonary effort is normal.     Breath sounds: Normal breath sounds.  Abdominal:     General: There is no distension.     Palpations: Abdomen is soft.     Tenderness: There is no abdominal tenderness.  Musculoskeletal: Normal range of motion.  Skin:    General: Skin is warm and dry.  Neurological:     General: No focal deficit present.     Mental Status: He is alert and oriented to person, place, and time.  Psychiatric:        Mood and Affect: Mood normal.        Behavior: Behavior normal.        Thought Content: Thought content normal.        Judgment: Judgment  normal.     Imaging: Dg Chest 2 View  Result Date: 03/05/2018 CLINICAL DATA:  Short of breath.  Recent pneumonia EXAM: CHEST - 2 VIEW COMPARISON:  None. FINDINGS: Sternotomy wires overlie normal cardiac silhouette. Normal pulmonary vasculature. No effusion, infiltrate, or pneumothorax. No acute osseous abnormality. IMPRESSION: No acute cardiopulmonary process. Electronically Signed   By: Suzy Bouchard M.D.   On: 03/05/2018 15:11   Dg Chest 2 View  Result Date: 02/14/2018 CLINICAL DATA:  COPD is aspiration.  Follow-up pneumonia. EXAM: CHEST - 2 VIEW COMPARISON:  01/15/2018 FINDINGS: Borderline cardiomegaly. Normal vascularity. Bilateral central and basilar airspace opacities have improved. Some disease persists. Postoperative changes and sternotomy are noted. No pneumothorax. No pleural effusion. IMPRESSION: Improving bilateral airspace opacities. Electronically Signed   By: Marybelle Killings M.D.   On: 02/14/2018 11:02   Ct Angio Chest/abd/pel For Dissection W And/or W/wo  Addendum Date: 03/05/2018   ADDENDUM REPORT: 03/05/2018 20:17 ADDENDUM: Correction: There are several omissions in the body of the chest CT report due to recent change from table mike to speech microphone. Under cardiovascular, it should state NO large central pulmonary embolus is noted. And likewise NO thoracic aortic aneurysm is identified. Under lungs/pleura common should state: As stated above, there is pulmonary consolidation in the superior segment of the left lower lobe abutting the pleura. A small right effusion and atelectasis is noted. Centrilobular emphysema is noted, upper lobe predominant. Findings discussed with Wyn Quaker. Electronically Signed   By: Ashley Royalty M.D.   On: 03/05/2018 20:17   Result Date: 03/05/2018 CLINICAL DATA:  Dyspnea on exertion. Recently diagnosed 12 5 discharge 60 EXAM: CT ANGIOGRAPHY CHEST, ABDOMEN AND PELVIS TECHNIQUE: Multidetector CT imaging through the chest, abdomen and pelvis was  performed using the standard protocol during bolus administration of intravenous contrast. Multiplanar reconstructed images and MIPs were obtained and reviewed to evaluate the vascular anatomy. CONTRAST:  124m ISOVUE-370 IOPAMIDOL (ISOVUE-370) INJECTION 76% COMPARISON:  None. FINDINGS: CTA CHEST FINDINGS Cardiovascular: The unenhanced images through the chest demonstrate. Atherosclerosis of the thoracic aorta branch vessels are. Native main three-vessel coronary arteriosclerosis is noted with post CABG change present heart size is top normal effusion After IV contrast administration thoracic aortic aneurysm is identified. Linear streak artifacts are noted within the common carotid and subclavian veins emanating hyperdense contrast the adjacent left subclavian conclusive evidence dissection. Large central embolus is noted though the study is not tailored toward assessment emboli Mediastinum/Nodes: Normal sized thyroid gland without dominant mass. Mildly enlarged left tracheobronchial lymph nodes measuring up to 12 mm short axis larger 2 cm short axis left hilar soft tissue density/lymph nodes noted. These may be reactive given what appear to pulmonary consolidations in the superior segment of the left lower lobe compatible with pneumonia and/or atelectasis. Patent trachea and mainstem bronchi. The CT appearance of the esophagus is unremarkable. Lungs/Pleura: As stated above, there is pulmonary consolidation in superior segment of the lower lobe abutting the small right effusion and atelectasis. Centrilobular emphysema is, upper lobe. No pneumothorax. Musculoskeletal: No chest wall abnormality. No acute or significant osseous findings. Mild degenerative change along the thoracic spine. Median sternotomy sutures are in place. Review of the MIP images confirms the above findings. CTA ABDOMEN AND PELVIS FINDINGS VASCULAR Aorta: 4.5 cm infrarenal abdominal aortic aneurysm crescentic soft plaque along the posterior and  left lateral wall series 8/92. Dissection. Celiac: Minimal atherosclerosis at the origin celiac axis patent branch vessels with atherosclerosis along the splenic artery dissection, aneurysm or significant stenosis. SMA: Patent with atherosclerotic origins. Occlusion or dissection aneurysm Renals: Single bilateral renal arteries atherosclerosis but fibromuscular dysplasia, stenosis, aneurysm or dissection identified. IMA: Patent Inflow: Patent without evidence of aneurysm, dissection, vasculitis or significant stenosis. Atherosclerosis common iliac arteries probable chronic calcified dissection involving the proximal left common iliac artery, series 8/233. Veins: No obvious venous abnormality within the limitations of this arterial phase study. Review of the MIP images confirms the above findings. NON-VASCULAR Hepatobiliary: No focal liver abnormality is seen. No gallstones, gallbladder wall thickening, or biliary dilatation. Pancreas: Mild ectasia of the pancreatic duct. No inflammation or dominant enhancing mass. Spleen: Normal Adrenals/Urinary Tract: Normal bilateral adrenal glands. Bilateral renal cysts without obstructive uropathy. No solid enhancing masses are noted. The dependent layering bladder calculi are identified. Diverticulosis of the bladder is also noted at the dome of the bladder. Stomach/Bowel: Decompressed stomach with normal duodenal sweep and ligament of Treitz position. No acute bowel obstruction. Extensive colonic diverticulosis is noted along the descending sigmoid colon. Suggestion mild pericolonic inflammation along the mid sigmoid raises the possibility of a subtle sigmoid diverticulitis, series 8/250. Lymphatic: No chest, abdomen or pelvic adenopathy. Reproductive: Mildly enlarged prostate with peripheral zone calcifications. Other: No free air or free fluid. Musculoskeletal: Lumbar spondylosis with multilevel degenerative disc and endplate changes. Schmorl's node involving the superior  endplate of L3. Review of the MIP images confirms the above findings. IMPRESSION: 1. 4.5 cm infrarenal abdominal aortic aneurysm with crescentic soft plaque along the posterior and left lateral wall. Patent branch vessels with atherosclerosis 2. No thoracic aortic aneurysm or dissection. No acute pulmonary embolus. 3. Left lower lobe pneumonia with with mediastinal and left hilar reactive adenopathy. Follow-up to assure resolution is suggested. 4. Bilateral renal cysts. 5. Descending and sigmoid colonic diverticulosis with subtle pericolonic fatty induration along the mid sigmoid raises the possibility of a mild uncomplicated sigmoid diverticulitis. Electronically Signed: By: DMeredith LeedsD.  On: 03/05/2018 19:57    Labs:  CBC: Recent Labs    01/17/18 0344 01/18/18 0304 03/05/18 1636 03/05/18 2221 03/06/18 0341  WBC 13.7* 13.6* 7.5  --  7.9  HGB 12.8* 13.1 12.2*  --  11.6*  HCT 40.0 41.0 37.2*  --  36.1*  PLT 174 169 8* 8* 7*    COAGS: Recent Labs    03/05/18 2221  INR 1.25  APTT 37*    BMP: Recent Labs    01/23/18 1216 02/14/18 1011 03/05/18 1636 03/05/18 1752 03/06/18 0341  NA 140 139 141  --  143  K 3.3* 4.0 2.8*  --  3.2*  CL 99 104 104  --  102  CO2 33* 26 24  --  26  GLUCOSE 196* 176* 106*  --  113*  BUN 22 13 14   --  15  CALCIUM 9.3 9.6 9.7  --  10.5*  CREATININE 1.12* 0.91 1.45* 1.40* 1.58*  GFRNONAA 61 78 45*  --  40*  GFRAA 71 91 52*  --  47*    LIVER FUNCTION TESTS: Recent Labs    01/11/18 0935 01/13/18 0858 03/05/18 1636 03/06/18 0341  BILITOT 1.0 2.0* 2.1* 2.0*  AST 22 46* 106* 111*  ALT 14 25 46* 49*  ALKPHOS 39 45 139* 156*  PROT 5.6* 5.4* 5.3* 5.7*  ALBUMIN 3.1* 2.7* 2.7* 2.9*    TUMOR MARKERS: No results for input(s): AFPTM, CEA, CA199, CHROMGRNA in the last 8760 hours.  Assessment and Plan:  Thrombocytopenia  Will proceed with bone marrow biopsy.  Risks and benefits discussed with the patient including, but not limited to  bleeding, infection, damage to adjacent structures or low yield requiring additional tests.  All of the patient's questions were answered, patient is agreeable to proceed. Consent signed and in chart.  Thank you for this interesting consult.  I greatly enjoyed meeting KAVARI PARRILLO and look forward to participating in their care.  A copy of this report was sent to the requesting provider on this date.  Electronically Signed: Murrell Redden, PA-C   03/06/2018, 12:53 PM      I spent a total of 20 Minutes in face to face in clinical consultation, greater than 50% of which was counseling/coordinating care for bone marrow biopsy.

## 2018-03-06 NOTE — Progress Notes (Signed)
Transfusion of platelets completed at 1716. Called lab to perform platelet draw; awaiting lab results.   Called IR to inquire if bone marrow biopsy could be completed today 1/28. Informed by IR patient needs to be NPO at midnight prior to procedure, procedure needs to be performed prior to 12noon when cytology is available and that PA would put in orders needed for procedure. Paged MD Ghimire to notify of IR information at 1748. Awaiting orders.

## 2018-03-06 NOTE — Progress Notes (Signed)
IP PROGRESS NOTE  Subjective:   No complaint.  No bleeding or dyspnea.  Objective: Vital signs in last 24 hours: Blood pressure 140/76, pulse 96, temperature 97.9 F (36.6 C), temperature source Oral, resp. rate (!) 25, height 5' 9"  (1.753 m), weight 210 lb (95.3 kg), SpO2 92 %.  Intake/Output from previous day: 01/27 0701 - 01/28 0700 In: 240 [P.O.:240] Out: -   Physical Exam:  HEENT: Oral cavity without bleeding or thrush Lungs: Distant breath sounds, rhonchi at the left upper anterior chest, no respiratory distress Cardiac: Regular rate and rhythm Abdomen: No hepatosplenomegaly Extremities: No leg edema Skin: Ecchymoses over the arms bilaterally, few petechiae at the lower legs    Lab Results: Recent Labs    03/05/18 1636 03/05/18 2221 03/06/18 0341  WBC 7.5  --  7.9  HGB 12.2*  --  11.6*  HCT 37.2*  --  36.1*  PLT 8* 8* 7*    BMET Recent Labs    03/05/18 1636 03/05/18 1752 03/06/18 0341  NA 141  --  143  K 2.8*  --  3.2*  CL 104  --  102  CO2 24  --  26  GLUCOSE 106*  --  113*  BUN 14  --  15  CREATININE 1.45* 1.40* 1.58*  CALCIUM 9.7  --  10.5*    No results found for: CEA1  Studies/Results: Dg Chest 2 View  Result Date: 03/05/2018 CLINICAL DATA:  Short of breath.  Recent pneumonia EXAM: CHEST - 2 VIEW COMPARISON:  None. FINDINGS: Sternotomy wires overlie normal cardiac silhouette. Normal pulmonary vasculature. No effusion, infiltrate, or pneumothorax. No acute osseous abnormality. IMPRESSION: No acute cardiopulmonary process. Electronically Signed   By: Suzy Bouchard M.D.   On: 03/05/2018 15:11   Ct Angio Chest/abd/pel For Dissection W And/or W/wo  Addendum Date: 03/05/2018   ADDENDUM REPORT: 03/05/2018 20:17 ADDENDUM: Correction: There are several omissions in the body of the chest CT report due to recent change from table mike to speech microphone. Under cardiovascular, it should state NO large central pulmonary embolus is noted. And likewise  NO thoracic aortic aneurysm is identified. Under lungs/pleura common should state: As stated above, there is pulmonary consolidation in the superior segment of the left lower lobe abutting the pleura. A small right effusion and atelectasis is noted. Centrilobular emphysema is noted, upper lobe predominant. Findings discussed with Joseph Berg. Electronically Signed   By: Ashley Royalty M.D.   On: 03/05/2018 20:17   Result Date: 03/05/2018 CLINICAL DATA:  Dyspnea on exertion. Recently diagnosed 12 5 discharge 45 EXAM: CT ANGIOGRAPHY CHEST, ABDOMEN AND PELVIS TECHNIQUE: Multidetector CT imaging through the chest, abdomen and pelvis was performed using the standard protocol during bolus administration of intravenous contrast. Multiplanar reconstructed images and MIPs were obtained and reviewed to evaluate the vascular anatomy. CONTRAST:  165m ISOVUE-370 IOPAMIDOL (ISOVUE-370) INJECTION 76% COMPARISON:  None. FINDINGS: CTA CHEST FINDINGS Cardiovascular: The unenhanced images through the chest demonstrate. Atherosclerosis of the thoracic aorta branch vessels are. Native main three-vessel coronary arteriosclerosis is noted with post CABG change present heart size is top normal effusion After IV contrast administration thoracic aortic aneurysm is identified. Linear streak artifacts are noted within the common carotid and subclavian veins emanating hyperdense contrast the adjacent left subclavian conclusive evidence dissection. Large central embolus is noted though the study is not tailored toward assessment emboli Mediastinum/Nodes: Normal sized thyroid gland without dominant mass. Mildly enlarged left tracheobronchial lymph nodes measuring up to 12 mm short axis larger  2 cm short axis left hilar soft tissue density/lymph nodes noted. These may be reactive given what appear to pulmonary consolidations in the superior segment of the left lower lobe compatible with pneumonia and/or atelectasis. Patent trachea and  mainstem bronchi. The CT appearance of the esophagus is unremarkable. Lungs/Pleura: As stated above, there is pulmonary consolidation in superior segment of the lower lobe abutting the small right effusion and atelectasis. Centrilobular emphysema is, upper lobe. No pneumothorax. Musculoskeletal: No chest wall abnormality. No acute or significant osseous findings. Mild degenerative change along the thoracic spine. Median sternotomy sutures are in place. Review of the MIP images confirms the above findings. CTA ABDOMEN AND PELVIS FINDINGS VASCULAR Aorta: 4.5 cm infrarenal abdominal aortic aneurysm crescentic soft plaque along the posterior and left lateral wall series 8/92. Dissection. Celiac: Minimal atherosclerosis at the origin celiac axis patent branch vessels with atherosclerosis along the splenic artery dissection, aneurysm or significant stenosis. SMA: Patent with atherosclerotic origins. Occlusion or dissection aneurysm Renals: Single bilateral renal arteries atherosclerosis but fibromuscular dysplasia, stenosis, aneurysm or dissection identified. IMA: Patent Inflow: Patent without evidence of aneurysm, dissection, vasculitis or significant stenosis. Atherosclerosis common iliac arteries probable chronic calcified dissection involving the proximal left common iliac artery, series 8/233. Veins: No obvious venous abnormality within the limitations of this arterial phase study. Review of the MIP images confirms the above findings. NON-VASCULAR Hepatobiliary: No focal liver abnormality is seen. No gallstones, gallbladder wall thickening, or biliary dilatation. Pancreas: Mild ectasia of the pancreatic duct. No inflammation or dominant enhancing mass. Spleen: Normal Adrenals/Urinary Tract: Normal bilateral adrenal glands. Bilateral renal cysts without obstructive uropathy. No solid enhancing masses are noted. The dependent layering bladder calculi are identified. Diverticulosis of the bladder is also noted at the  dome of the bladder. Stomach/Bowel: Decompressed stomach with normal duodenal sweep and ligament of Treitz position. No acute bowel obstruction. Extensive colonic diverticulosis is noted along the descending sigmoid colon. Suggestion mild pericolonic inflammation along the mid sigmoid raises the possibility of a subtle sigmoid diverticulitis, series 8/250. Lymphatic: No chest, abdomen or pelvic adenopathy. Reproductive: Mildly enlarged prostate with peripheral zone calcifications. Other: No free air or free fluid. Musculoskeletal: Lumbar spondylosis with multilevel degenerative disc and endplate changes. Schmorl's node involving the superior endplate of L3. Review of the MIP images confirms the above findings. IMPRESSION: 1. 4.5 cm infrarenal abdominal aortic aneurysm with crescentic soft plaque along the posterior and left lateral wall. Patent branch vessels with atherosclerosis 2. No thoracic aortic aneurysm or dissection. No acute pulmonary embolus. 3. Left lower lobe pneumonia with with mediastinal and left hilar reactive adenopathy. Follow-up to assure resolution is suggested. 4. Bilateral renal cysts. 5. Descending and sigmoid colonic diverticulosis with subtle pericolonic fatty induration along the mid sigmoid raises the possibility of a mild uncomplicated sigmoid diverticulitis. Electronically Signed: By: Ashley Royalty M.D. On: 03/05/2018 19:57    Medications: I have reviewed the patient's current medications.  Assessment/Plan:  1.  Thrombocytopenia 2.  Left lung airspace consolidation-pneumonia? 3.  COPD 4.  History of coronary artery disease 5.  CHF 6.  BPH 7.  Admission with pneumonia December 2019 8.  Mild renal insufficiency 9.  Elevated liver enzymes 10.  Coagulopathy   Joseph Berg appears unchanged.  He has persistent severe thrombocytopenia.  The LDH is elevated and there is a mild coagulopathy.  I reviewed the case with several of my hematology colleagues.  We are most suspicious  of a bone marrow process.  There is a low clinical suspicion  for ITP and TTP.  Recommendations: 1.  Platelet transfusion today 2.  Bone marrow biopsy  Hematology will continue following Mr. Rehfeld daily.   LOS: 1 day   [name], MD   03/06/2018, 8:55 AM

## 2018-03-06 NOTE — Progress Notes (Signed)
                       PROGRESS NOTE        PATIENT DETAILS Name: Joseph Berg Age: 85 y.o. Sex: male Date of Birth: 01/18/1934 Admit Date: 03/05/2018 Admitting Physician Rachal A David, MD PCP:Metheney, Catherine D, MD  Brief Narrative: Patient is a 85 y.o. male with history of COPD, chronic systolic heart failure, same-recently hospitalized from 01/11/2018-01/18/2018-for acute hypoxic respiratory failure secondary to multifocal pneumonia-presented to the ED with fatigue, found to have severe thrombocytopenia.  See below for further details.  Subjective: No bleeding overnight-not short of breath.  Wife/patient deny fever, cough, nausea, vomiting or diarrhea before he came to the hospital.  Assessment/Plan: Severe thrombocytopenia: Etiology unclear-work-up in progress-hematology suspect that this may be a primary bone marrow issue-bone marrow biopsy ordered this morning.  Thankfully no evidence of bleeding-but given severe low platelet counts-being transfused 1 unit pheresed platelets today.  Await further recommendations from hematology.  Left lobar pneumonia: Patient really does not have a lot of symptoms compatible with pneumonia-suspect that the consolidation that was seen on CT chest may be residual from his most recent pneumonia.  Respiratory virus panel negative.  Stop Flagyl-continue Rocephin/doxy-we will plan on a 5-day treatment.  COPD: No evidence of exacerbation-continue with bronchodilators.  Chronic systolic heart failure: Volume status is stable-continue Lasix.  He has a history of chronic systolic heart failure, but his most recent echocardiogram showed normalization of EF.  CAD s/p CABG: No anginal symptoms-antiplatelets on hold due to severe thrombocytopenia.  Debility/deconditioning: Suspect some amount of debility/deconditioning at baseline-but worsened due to above-noted acute issues.  Appreciated-recommendations are for home health services.  OSA: Continue  CPAP nightly  DVT Prophylaxis: SCD's  Code Status: Full code  Family Communication: Spouse at bedside  Disposition Plan: Remain inpatient-home health services on discharge-requires several more days of inpatient hospital stay.  Antimicrobial agents: Anti-infectives (From admission, onward)   Start     Dose/Rate Route Frequency Ordered Stop   03/05/18 2245  cefTRIAXone (ROCEPHIN) 1 g in sodium chloride 0.9 % 100 mL IVPB     1 g 200 mL/hr over 30 Minutes Intravenous Every 24 hours 03/05/18 2237 03/12/18 2244   03/05/18 2245  doxycycline (VIBRA-TABS) tablet 100 mg     100 mg Oral Every 12 hours 03/05/18 2237 03/12/18 2159   03/05/18 2245  metroNIDAZOLE (FLAGYL) IVPB 500 mg     500 mg 100 mL/hr over 60 Minutes Intravenous Every 8 hours 03/05/18 2237     03/05/18 2030  cefTRIAXone (ROCEPHIN) 2 g in sodium chloride 0.9 % 100 mL IVPB     2 g 200 mL/hr over 30 Minutes Intravenous  Once 03/05/18 2016 03/05/18 2128   03/05/18 2030  metroNIDAZOLE (FLAGYL) IVPB 500 mg     500 mg 100 mL/hr over 60 Minutes Intravenous  Once 03/05/18 2016 03/05/18 2257   03/05/18 2030  doxycycline (VIBRA-TABS) tablet 100 mg     100 mg Oral  Once 03/05/18 2016 03/05/18 2056      Procedures: None  CONSULTS:  hematology/oncology  Time spent: 25- minutes-Greater than 50% of this time was spent in counseling, explanation of diagnosis, planning of further management, and coordination of care.  MEDICATIONS: Scheduled Meds: . sodium chloride   Intravenous Once  . carvedilol  12.5 mg Oral BID WC  . doxycycline  100 mg Oral Q12H  . fluticasone furoate-vilanterol  1 puff Inhalation Daily  .   furosemide  40 mg Oral Daily  . mouth rinse  15 mL Mouth Rinse BID  . multivitamin with minerals  1 tablet Oral Daily  . rosuvastatin  40 mg Oral Daily  . sodium chloride flush  3 mL Intravenous Q12H  . umeclidinium bromide  1 puff Inhalation Daily   Continuous Infusions: . sodium chloride 250 mL (03/06/18 0633)    . cefTRIAXone (ROCEPHIN)  IV    . metronidazole 500 mg (03/06/18 0634)   PRN Meds:.sodium chloride, albuterol, ipratropium-albuterol, senna-docusate, sodium chloride flush   PHYSICAL EXAM: Vital signs: Vitals:   03/05/18 2215 03/06/18 0038 03/06/18 0403 03/06/18 0831  BP: 106/73 136/72  140/76  Pulse: 98 92  96  Resp: (!) 24 (!) 31  (!) 25  Temp:   97.9 F (36.6 C) 97.9 F (36.6 C)  TempSrc:   Oral Oral  SpO2: 92% 96%  92%  Weight:      Height:       Filed Weights   03/05/18 1610  Weight: 95.3 kg   Body mass index is 31.01 kg/m.   General appearance :Awake, alert, not in any distress.  Chronically sick appearing. HEENT: Atraumatic and Normocephalic Neck: supple Resp:Good air entry bilaterally, few scattered rhonchi. CVS: S1 S2 regular, no murmurs.  GI: Bowel sounds present, Non tender and not distended with no gaurding, rigidity or rebound.No organomegaly Extremities: B/L Lower Ext shows no edema, both legs are warm to touch Neurology:  speech clear,Non focal, sensation is grossly intact. Psychiatric: Normal judgment and insight. Alert and oriented x 3. Normal mood. Musculoskeletal:No digital cyanosis Skin:No Rash, warm and dry Wounds:N/A  I have personally reviewed following labs and imaging studies  LABORATORY DATA: CBC: Recent Labs  Lab 03/05/18 1636 03/05/18 2221 03/06/18 0341  WBC 7.5  --  7.9  NEUTROABS 4.7  --  4.8  HGB 12.2*  --  11.6*  HCT 37.2*  --  36.1*  MCV 91.9  --  89.6  PLT 8* 8* 7*    Basic Metabolic Panel: Recent Labs  Lab 03/05/18 1636 03/05/18 1752 03/05/18 2030 03/06/18 0341  NA 141  --   --  143  K 2.8*  --   --  3.2*  CL 104  --   --  102  CO2 24  --   --  26  GLUCOSE 106*  --   --  113*  BUN 14  --   --  15  CREATININE 1.45* 1.40*  --  1.58*  CALCIUM 9.7  --   --  10.5*  MG  --   --  1.7  --     GFR: Estimated Creatinine Clearance: 41 mL/min (A) (by C-G formula based on SCr of 1.58 mg/dL (H)).  Liver Function  Tests: Recent Labs  Lab 03/05/18 1636 03/06/18 0341  AST 106* 111*  ALT 46* 49*  ALKPHOS 139* 156*  BILITOT 2.1* 2.0*  PROT 5.3* 5.7*  ALBUMIN 2.7* 2.9*   No results for input(s): LIPASE, AMYLASE in the last 168 hours. No results for input(s): AMMONIA in the last 168 hours.  Coagulation Profile: Recent Labs  Lab 03/05/18 2221  INR 1.25    Cardiac Enzymes: No results for input(s): CKTOTAL, CKMB, CKMBINDEX, TROPONINI in the last 168 hours.  BNP (last 3 results) No results for input(s): PROBNP in the last 8760 hours.  HbA1C: No results for input(s): HGBA1C in the last 72 hours.  CBG: No results for input(s): GLUCAP in the last 168 hours.    Lipid Profile: No results for input(s): CHOL, HDL, LDLCALC, TRIG, CHOLHDL, LDLDIRECT in the last 72 hours.  Thyroid Function Tests: No results for input(s): TSH, T4TOTAL, FREET4, T3FREE, THYROIDAB in the last 72 hours.  Anemia Panel: Recent Labs    03/05/18 2242 03/06/18 0731  VITAMINB12  --  1,696*  RETICCTPCT 2.5  --     Urine analysis:    Component Value Date/Time   COLORURINE YELLOW 03/05/2018 2038   APPEARANCEUR CLEAR 03/05/2018 2038   LABSPEC 1.023 03/05/2018 2038   PHURINE 6.0 03/05/2018 2038   GLUCOSEU NEGATIVE 03/05/2018 2038   HGBUR MODERATE (A) 03/05/2018 2038   BILIRUBINUR NEGATIVE 03/05/2018 2038   BILIRUBINUR neg 10/31/2011 1121   KETONESUR NEGATIVE 03/05/2018 2038   PROTEINUR NEGATIVE 03/05/2018 2038   UROBILINOGEN 2.0 (H) 08/09/2012 1316   NITRITE NEGATIVE 03/05/2018 2038   LEUKOCYTESUR NEGATIVE 03/05/2018 2038    Sepsis Labs: Lactic Acid, Venous    Component Value Date/Time   LATICACIDVEN 1.84 01/11/2018 1155    MICROBIOLOGY: Recent Results (from the past 240 hour(s))  MRSA PCR Screening     Status: None   Collection Time: 03/06/18  1:36 AM  Result Value Ref Range Status   MRSA by PCR NEGATIVE NEGATIVE Final    Comment:        The GeneXpert MRSA Assay (FDA approved for NASAL  specimens only), is one component of a comprehensive MRSA colonization surveillance program. It is not intended to diagnose MRSA infection nor to guide or monitor treatment for MRSA infections. Performed at [facility], [place] [address] [place], [place] [address]   Respiratory Panel by PCR     Status: None   Collection Time: 03/06/18  1:58 AM  Result Value Ref Range Status   Adenovirus NOT DETECTED NOT DETECTED Final   Coronavirus 229E NOT DETECTED NOT DETECTED Final   Coronavirus HKU1 NOT DETECTED NOT DETECTED Final   Coronavirus NL63 NOT DETECTED NOT DETECTED Final   Coronavirus OC43 NOT DETECTED NOT DETECTED Final   Metapneumovirus NOT DETECTED NOT DETECTED Final   Rhinovirus / Enterovirus NOT DETECTED NOT DETECTED Final   Influenza A NOT DETECTED NOT DETECTED Final   Influenza B NOT DETECTED NOT DETECTED Final   Parainfluenza Virus 1 NOT DETECTED NOT DETECTED Final   Parainfluenza Virus 2 NOT DETECTED NOT DETECTED Final   Parainfluenza Virus 3 NOT DETECTED NOT DETECTED Final   Parainfluenza Virus 4 NOT DETECTED NOT DETECTED Final   Respiratory Syncytial Virus NOT DETECTED NOT DETECTED Final   Bordetella pertussis NOT DETECTED NOT DETECTED Final   Chlamydophila pneumoniae NOT DETECTED NOT DETECTED Final   Mycoplasma pneumoniae NOT DETECTED NOT DETECTED Final    Comment: Performed at [facility], [place] [address] [place], [place] [address]    RADIOLOGY STUDIES/RESULTS: Dg Chest 2 View  Result Date: 03/05/2018 CLINICAL DATA:  Short of breath.  Recent pneumonia EXAM: CHEST - 2 VIEW COMPARISON:  None. FINDINGS: Sternotomy wires overlie normal cardiac silhouette. Normal pulmonary vasculature. No effusion, infiltrate, or pneumothorax. No acute osseous abnormality. IMPRESSION: No acute cardiopulmonary process. Electronically Signed   By: Suzy Bouchard M.D.   On: 03/05/2018 15:11   Dg Chest 2 View  Result Date: 02/14/2018 CLINICAL DATA:  COPD is aspiration.  Follow-up  pneumonia. EXAM: CHEST - 2 VIEW COMPARISON:  01/15/2018 FINDINGS: Borderline cardiomegaly. Normal vascularity. Bilateral central and basilar airspace opacities have improved. Some disease persists. Postoperative changes and sternotomy are noted. No pneumothorax. No pleural effusion. IMPRESSION: Improving  bilateral airspace opacities. Electronically Signed   By: Arthur  Hoss M.D.   On: 02/14/2018 11:02   Ct Angio Chest/abd/pel For Dissection W And/or W/wo  Addendum Date: 03/05/2018   ADDENDUM REPORT: 03/05/2018 20:17 ADDENDUM: Correction: There are several omissions in the body of the chest CT report due to recent change from table mike to speech microphone. Under cardiovascular, it should state NO large central pulmonary embolus is noted. And likewise NO thoracic aortic aneurysm is identified. Under lungs/pleura common should state: As stated above, there is pulmonary consolidation in the superior segment of the left lower lobe abutting the pleura. A small right effusion and atelectasis is noted. Centrilobular emphysema is noted, upper lobe predominant. Findings discussed with Elizabeth Hammond. Electronically Signed   By: David  Kwon M.D.   On: 03/05/2018 20:17   Result Date: 03/05/2018 CLINICAL DATA:  Dyspnea on exertion. Recently diagnosed 12 5 discharge 87 EXAM: CT ANGIOGRAPHY CHEST, ABDOMEN AND PELVIS TECHNIQUE: Multidetector CT imaging through the chest, abdomen and pelvis was performed using the standard protocol during bolus administration of intravenous contrast. Multiplanar reconstructed images and MIPs were obtained and reviewed to evaluate the vascular anatomy. CONTRAST:  100mL ISOVUE-370 IOPAMIDOL (ISOVUE-370) INJECTION 76% COMPARISON:  None. FINDINGS: CTA CHEST FINDINGS Cardiovascular: The unenhanced images through the chest demonstrate. Atherosclerosis of the thoracic aorta branch vessels are. Native main three-vessel coronary arteriosclerosis is noted with post CABG change present heart size is  top normal effusion After IV contrast administration thoracic aortic aneurysm is identified. Linear streak artifacts are noted within the common carotid and subclavian veins emanating hyperdense contrast the adjacent left subclavian conclusive evidence dissection. Large central embolus is noted though the study is not tailored toward assessment emboli Mediastinum/Nodes: Normal sized thyroid gland without dominant mass. Mildly enlarged left tracheobronchial lymph nodes measuring up to 12 mm short axis larger 2 cm short axis left hilar soft tissue density/lymph nodes noted. These may be reactive given what appear to pulmonary consolidations in the superior segment of the left lower lobe compatible with pneumonia and/or atelectasis. Patent trachea and mainstem bronchi. The CT appearance of the esophagus is unremarkable. Lungs/Pleura: As stated above, there is pulmonary consolidation in superior segment of the lower lobe abutting the small right effusion and atelectasis. Centrilobular emphysema is, upper lobe. No pneumothorax. Musculoskeletal: No chest wall abnormality. No acute or significant osseous findings. Mild degenerative change along the thoracic spine. Median sternotomy sutures are in place. Review of the MIP images confirms the above findings. CTA ABDOMEN AND PELVIS FINDINGS VASCULAR Aorta: 4.5 cm infrarenal abdominal aortic aneurysm crescentic soft plaque along the posterior and left lateral wall series 8/92. Dissection. Celiac: Minimal atherosclerosis at the origin celiac axis patent branch vessels with atherosclerosis along the splenic artery dissection, aneurysm or significant stenosis. SMA: Patent with atherosclerotic origins. Occlusion or dissection aneurysm Renals: Single bilateral renal arteries atherosclerosis but fibromuscular dysplasia, stenosis, aneurysm or dissection identified. IMA: Patent Inflow: Patent without evidence of aneurysm, dissection, vasculitis or significant stenosis. Atherosclerosis  common iliac arteries probable chronic calcified dissection involving the proximal left common iliac artery, series 8/233. Veins: No obvious venous abnormality within the limitations of this arterial phase study. Review of the MIP images confirms the above findings. NON-VASCULAR Hepatobiliary: No focal liver abnormality is seen. No gallstones, gallbladder wall thickening, or biliary dilatation. Pancreas: Mild ectasia of the pancreatic duct. No inflammation or dominant enhancing mass. Spleen: Normal Adrenals/Urinary Tract: Normal bilateral adrenal glands. Bilateral renal cysts without obstructive uropathy. No solid enhancing masses are noted. The   dependent layering bladder calculi are identified. Diverticulosis of the bladder is also noted at the dome of the bladder. Stomach/Bowel: Decompressed stomach with normal duodenal sweep and ligament of Treitz position. No acute bowel obstruction. Extensive colonic diverticulosis is noted along the descending sigmoid colon. Suggestion mild pericolonic inflammation along the mid sigmoid raises the possibility of a subtle sigmoid diverticulitis, series 8/250. Lymphatic: No chest, abdomen or pelvic adenopathy. Reproductive: Mildly enlarged prostate with peripheral zone calcifications. Other: No free air or free fluid. Musculoskeletal: Lumbar spondylosis with multilevel degenerative disc and endplate changes. Schmorl's node involving the superior endplate of L3. Review of the MIP images confirms the above findings. IMPRESSION: 1. 4.5 cm infrarenal abdominal aortic aneurysm with crescentic soft plaque along the posterior and left lateral wall. Patent branch vessels with atherosclerosis 2. No thoracic aortic aneurysm or dissection. No acute pulmonary embolus. 3. Left lower lobe pneumonia with with mediastinal and left hilar reactive adenopathy. Follow-up to assure resolution is suggested. 4. Bilateral renal cysts. 5. Descending and sigmoid colonic diverticulosis with subtle  pericolonic fatty induration along the mid sigmoid raises the possibility of a mild uncomplicated sigmoid diverticulitis. Electronically Signed: By: Ashley Royalty M.D. On: 03/05/2018 19:57     LOS: 1 day   Oren Binet, MD  [facility]  If 7PM-7AM, please contact night-coverage  Please page via [email or web address]-click on MD name and type text message  03/06/2018, 10:14 AM

## 2018-03-06 NOTE — Evaluation (Signed)
Clinical/Bedside Swallow Evaluation Patient Details  Name: Joseph Berg MRN: [id number] Date of Birth: 05-Jun-1933  Today's Date: 03/06/2018 Time: SLP Start Time (ACUTE ONLY): 1117 SLP Stop Time (ACUTE ONLY): 1129 SLP Time Calculation (min) (ACUTE ONLY): 12 min  Past Medical History:  Past Medical History:  Diagnosis Date  . BPH (benign prostatic hyperplasia)   . CAD (coronary artery disease)    a. s/p CABG x1 with LIMA-LAD 1994. b. LM & 3v CAD by cath 07/2012  . CHF (congestive heart failure) ([place])    a. EF 35-40% by echo 07/2012.  [name] Complication of anesthesia    affected patients memory. Pt stated "I couldn't remember anything for three months...just bits and pieces"  . Emphysema    a. Moderate emphysema by CT 06/2012.  [name] Heart attack ([place]) 1994  . Hyperlipidemia   . Hypertension   . Hypoparathyroidism ([place])   . NSVT (nonsustained ventricular tachycardia) ([place])    a. Seen on tele 07/2012.  . OSA on CPAP    pt stated he does not wear CPAP because he no longer has sleep apnea  . Pneumonia   . Pseudoaneurysm of left femoral artery ([place])    a. post cath s/p compression 07/2012, associated w/ anemia.  . Sciatica 2012   Qualifier: Diagnosis of  By: Madilyn Fireman MD, Barnetta Chapel     Past Surgical History:  Past Surgical History:  Procedure Laterality Date  . CATARACT EXTRACTION, BILATERAL    . COLONOSCOPY    . CORONARY ARTERY BYPASS GRAFT  03-23-92  . CORONARY ARTERY BYPASS GRAFT N/A 08/14/2012   Procedure: REDO CORONARY ARTERY BYPASS GRAFTING (CABG);  Surgeon: Gaye Pollack, MD;  Location: [facility];  Service: Open Heart Surgery;  Laterality: N/A;  . INTRAOPERATIVE TRANSESOPHAGEAL ECHOCARDIOGRAM N/A 08/14/2012   Procedure: INTRAOPERATIVE TRANSESOPHAGEAL ECHOCARDIOGRAM;  Surgeon: Gaye Pollack, MD;  Location: [facility];  Service: Open Heart Surgery;  Laterality: N/A;  . LUMBAR LAMINECTOMY/DECOMPRESSION MICRODISCECTOMY Left 02/18/2015   Procedure: LUMBAR LAMINECTOMY/DECOMPRESSION  MICRODISCECTOMY- LEFT FOUR-FIVE ;  Surgeon: Ashok Pall, MD;  Location: [facility];  Service: Neurosurgery;  Laterality: Left;   HPI:  Pt is an 85 y.o. male admitted 03/05/18 with progressive SOB. Pt found to have thrombocytopenia of unclear etiology, awaiting bone marrow biopsy. CXR concerning for LLL PNA. CT noted diverticulitis. PMH includes COPD, CHF, CAD, AAA, recent admission 01/2018 with PNA. BSE in February 2018 recommended regular diet, thin liquids.    Assessment / Plan / Recommendation Clinical Impression  Pt cleared his throat after his initial cup sip of thin liquids, but showed no overt s/s of aspiration across any additional trials. Although he did have PNA last month, he denies having any other previous episodes. Given the above, would recommend continuing with current diet but with careful use of aspiration precautions and additional SLP f/u for tolerance. SLP Visit Diagnosis: Dysphagia, unspecified (R13.10)    Aspiration Risk  Mild aspiration risk    Diet Recommendation Regular;Thin liquid   Liquid Administration via: Cup;Straw Medication Administration: Whole meds with liquid Supervision: Patient able to self feed;Intermittent supervision to cue for compensatory strategies Compensations: Slow rate;Small sips/bites Postural Changes: Seated upright at 90 degrees    Other  Recommendations Oral Care Recommendations: Oral care BID   Follow up Recommendations None      Frequency and Duration min 2x/week  1 week       Prognosis Prognosis for Safe Diet Advancement: Good      Swallow Study   General HPI: Pt  is an 85 y.o. male admitted 03/05/18 with progressive SOB. Pt found to have thrombocytopenia of unclear etiology, awaiting bone marrow biopsy. CXR concerning for LLL PNA. CT noted diverticulitis. PMH includes COPD, CHF, CAD, AAA, recent admission 01/2018 with PNA. BSE in February 2018 recommended regular diet, thin liquids.  Type of Study: Bedside Swallow  Evaluation Previous Swallow Assessment: see HPI Diet Prior to this Study: Regular;Thin liquids Temperature Spikes Noted: No Respiratory Status: Nasal cannula History of Recent Intubation: No Behavior/Cognition: Alert;Cooperative;Pleasant mood;Other (Comment)(forgetful) Oral Cavity Assessment: Within Functional Limits Oral Care Completed by SLP: No Oral Cavity - Dentition: Missing dentition;Dentures, not available Vision: Functional for self-feeding Self-Feeding Abilities: Able to feed self Patient Positioning: Upright in chair Baseline Vocal Quality: Normal Volitional Cough: Strong Volitional Swallow: Able to elicit    Oral/Motor/Sensory Function Overall Oral Motor/Sensory Function: Within functional limits   Ice Chips Ice chips: Not tested   Thin Liquid Thin Liquid: Impaired Presentation: Self Fed;Cup;Straw Pharyngeal  Phase Impairments: Throat Clearing - Immediate    Nectar Thick Nectar Thick Liquid: Not tested   Honey Thick Honey Thick Liquid: Not tested   Puree Puree: Within functional limits Presentation: Self Fed;Spoon   Solid     Solid: Within functional limits Presentation: [place] 03/06/2018,12:58 PM   Nuala Alpha, M.A. [place] Acute Environmental education officer [phone] Office [phone]

## 2018-03-06 NOTE — Progress Notes (Signed)
[facility]  Patient Status: Active (receiving services up to time of hospitalization)  [facility] is providing the following services: RN and PT  If patient discharges after hours, please call [phone].   Joseph Berg 03/06/2018, 10:42 AM

## 2018-03-06 NOTE — Progress Notes (Signed)
Urinary strep came back positive. Ok to [employer] doxy and cont ceftriaxone for CAP per Dr Sloan Leiter.  Onnie Boer, PharmD, BCIDP, AAHIVP, CPP Infectious Disease Pharmacist 03/06/2018 1:12 PM

## 2018-03-06 NOTE — Evaluation (Signed)
Physical Therapy Evaluation Patient Details Name: Joseph Berg MRN: [id number] DOB: [birth year] Today's Date: 03/06/2018   History of Present Illness  Pt is an 85 y.o. male admitted 03/05/18 with progressive SOB, found to be hypoxic on room air by HHPT. Worked up for probable pneumonia; thrombocytopenia of unclear etiology, awaiting bone marrow biopsy. CT noted diverticulitis. PMH includes CHF, CAD, AAA, recent admission 01/2018 with PNA.    Clinical Impression  Pt presents with an overall decrease in functional mobility secondary to above. PTA, pt lives at home with wife, household ambulator; has been working with [place] services since recent admission in 01/2018. Today, pt able to perform OOB mobility with supervision; limited by generalized weakness and decreased activity tolerance. Pt would benefit from continued acute PT services to maximize functional mobility and independence prior to d/c with continued HHPT services.     Follow Up Recommendations Home health PT;Supervision for mobility/OOB    Equipment Recommendations  None recommended by PT    Recommendations for Other Services       Precautions / Restrictions Precautions Precautions: Fall Restrictions Weight Bearing Restrictions: No      Mobility  Bed Mobility Overal bed mobility: Needs Assistance Bed Mobility: Supine to Sit     Supine to sit: Min assist     General bed mobility comments: MinA for UE support to assist trunk elevation; HOB elevated, use of bed rail  Transfers Overall transfer level: Needs assistance Equipment used: None Transfers: Sit to/from Stand Sit to Stand: Supervision            Ambulation/Gait Ambulation/Gait assistance: Supervision Gait Distance (Feet): 3 Feet Assistive device: None Gait Pattern/deviations: Step-through pattern;Decreased stride length Gait velocity: Decreased   General Gait Details: Took steps from bed to recliner without UE support; supervision for safety.  Further mobility limited due to arrival of breakfast and pt wanting to eat  Stairs            Wheelchair Mobility    Modified Rankin (Stroke Patients Only)       Balance Overall balance assessment: Needs assistance   Sitting balance-Leahy Scale: Good       Standing balance-Leahy Scale: Fair                               Pertinent Vitals/Pain Pain Assessment: No/denies pain    Home Living Family/patient expects to be discharged to:: Private residence Living Arrangements: Spouse/significant other Available Help at Discharge: Family;Available 24 hours/day Type of Home: House Home Access: Stairs to enter   [employer] of Steps: 1 partial step (~3-4 inches)  Home Layout: One level Home Equipment: Cane - single point;Shower seat;Walker - 2 wheels      Prior Function Level of Independence: Needs assistance   Gait / Transfers Assistance Needed: Household ambulation with intermittent use of SPC. 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 SpO2 92-94% on O2 [place]    Exercises     Assessment/Plan    PT Assessment Patient needs continued PT services  PT Problem List  Decreased strength;Decreased activity tolerance;Decreased balance;Decreased mobility;Cardiopulmonary status limiting activity       PT Treatment Interventions DME instruction;Gait training;Stair training;Functional mobility training;Therapeutic activities;Therapeutic exercise;Balance training;Patient/family education    PT Goals (Current goals can be found in the Care Plan section)  Acute Rehab PT Goals Patient Stated Goal: Return home upon discharge; not interested in SNF PT Goal Formulation: With patient Time For Goal Achievement: 03/20/18 Potential to Achieve Goals: Good    Frequency Min 3X/week   Barriers to discharge        Co-evaluation               AM-PAC PT "6 Clicks" Mobility  Outcome Measure Help needed turning from your back to your side while in a flat bed without using bedrails?: A Little Help needed moving from lying on your back to sitting on the side of a flat bed without using bedrails?: A Little Help needed moving to and from a bed to a chair (including a wheelchair)?: A Little Help needed standing up from a chair using your arms (e.g., wheelchair or bedside chair)?: A Little Help needed to walk in hospital room?: A Little Help needed climbing 3-5 steps with a railing? : A Little 6 Click Score: 18    End of Session Equipment Utilized During Treatment: Oxygen Activity Tolerance: Patient limited by fatigue Patient left: in chair;with call bell/phone within reach;with family/visitor present Nurse Communication: Mobility status PT Visit Diagnosis: Other abnormalities of gait and mobility (R26.89)    Time: 8270-7867 PT Time Calculation (min) (ACUTE ONLY): 15 min   Charges:   PT Evaluation $PT Eval Moderate Complexity: 1 Mod      Mabeline Caras, PT, DPT Acute Rehabilitation Services  Pager [phone] Office [place] 03/06/2018, 9:28 AM

## 2018-03-06 NOTE — Progress Notes (Signed)
Pt arrived to [facility]. Alert and oriented x4. Identified appropriately. Pt VS stable, placed on progressive care monitor.  Orders reviewed, pt oriented to room and equipment. Wife at the bedside. Pt advised about belongings policy, no meds at the bedside. Pt instructed to call for assistance, verbalized understanding, bed alarm turned on and call bell left within reach. Will continue to monitor and treat pt per MD orders.

## 2018-03-07 ENCOUNTER — Inpatient Hospital Stay (HOSPITAL_COMMUNITY): Payer: Medicare PPO

## 2018-03-07 ENCOUNTER — Other Ambulatory Visit (HOSPITAL_COMMUNITY): Payer: Self-pay

## 2018-03-07 LAB — BPAM PLATELET PHERESIS
Blood Product Expiration Date: 202001292359
Blood Product Expiration Date: 202001302359
ISSUE DATE / TIME: 202001281157
ISSUE DATE / TIME: 202001281327
Unit Type and Rh: 5100
Unit Type and Rh: 8400

## 2018-03-07 LAB — PREPARE PLATELET PHERESIS
Unit division: 0
Unit division: 0

## 2018-03-07 LAB — COMPREHENSIVE METABOLIC PANEL
ALT: 46 U/L — ABNORMAL HIGH (ref 0–44)
AST: 111 U/L — ABNORMAL HIGH (ref 15–41)
Albumin: 2.8 g/dL — ABNORMAL LOW (ref 3.5–5.0)
Alkaline Phosphatase: 156 U/L — ABNORMAL HIGH (ref 38–126)
Anion gap: 13 (ref 5–15)
BUN: 17 mg/dL (ref 8–23)
CO2: 26 mmol/L (ref 22–32)
Calcium: 10.6 mg/dL — ABNORMAL HIGH (ref 8.9–10.3)
Chloride: 105 mmol/L (ref 98–111)
Creatinine, Ser: 1.4 mg/dL — ABNORMAL HIGH (ref 0.61–1.24)
GFR calc Af Amer: 54 mL/min — ABNORMAL LOW (ref >60)
GFR calc non Af Amer: 46 mL/min — ABNORMAL LOW (ref >60)
Glucose, Bld: 119 mg/dL — ABNORMAL HIGH (ref 70–99)
POTASSIUM: 3.6 mmol/L (ref 3.5–5.1)
Sodium: 144 mmol/L (ref 135–145)
Total Bilirubin: 2 mg/dL — ABNORMAL HIGH (ref 0.3–1.2)
Total Protein: 5.6 g/dL — ABNORMAL LOW (ref 6.5–8.1)

## 2018-03-07 LAB — CBC
HCT: 34.2 % — ABNORMAL LOW (ref 39.0–52.0)
Hemoglobin: 11 g/dL — ABNORMAL LOW (ref 13.0–17.0)
MCH: 29.1 pg (ref 26.0–34.0)
MCHC: 32.2 g/dL (ref 30.0–36.0)
MCV: 90.5 fL (ref 80.0–100.0)
Platelets: 11 10*3/uL — CL (ref 150–400)
RBC: 3.78 MIL/uL — ABNORMAL LOW (ref 4.22–5.81)
RDW: 14.8 % (ref 11.5–15.5)
WBC: 7.8 10*3/uL (ref 4.0–10.5)
nRBC: 1.7 % — ABNORMAL HIGH (ref 0.0–0.2)

## 2018-03-07 LAB — URINE CULTURE: Culture: NO GROWTH

## 2018-03-07 LAB — LACTATE DEHYDROGENASE: LDH: 540 U/L — ABNORMAL HIGH (ref 98–192)

## 2018-03-07 MED ORDER — MIDAZOLAM HCL 2 MG/2ML IJ SOLN
INTRAMUSCULAR | Status: AC | PRN
  Administered 2018-03-07: 1 mg via INTRAVENOUS
  Administered 2018-03-07: 0.5 mg via INTRAVENOUS

## 2018-03-07 MED ORDER — HYDROCODONE-ACETAMINOPHEN 5-325 MG PO TABS
1.0000 | ORAL_TABLET | ORAL | Status: DC | PRN
  Administered 2018-03-08 – 2018-03-09 (×3): 1 via ORAL
  Administered 2018-03-13 – 2018-03-18 (×6): 2 via ORAL
  Filled 2018-03-07 (×2): qty 2
  Filled 2018-03-07 (×2): qty 1
  Filled 2018-03-07 (×4): qty 2
  Filled 2018-03-07: qty 1

## 2018-03-07 MED ORDER — FENTANYL CITRATE (PF) 100 MCG/2ML IJ SOLN
INTRAMUSCULAR | Status: AC | PRN
  Administered 2018-03-07: 50 ug via INTRAVENOUS
  Administered 2018-03-07: 25 ug via INTRAVENOUS

## 2018-03-07 MED ORDER — LIDOCAINE HCL 1 % IJ SOLN
INTRAMUSCULAR | Status: AC
  Filled 2018-03-07: qty 20

## 2018-03-07 MED ORDER — MIDAZOLAM HCL 2 MG/2ML IJ SOLN
INTRAMUSCULAR | Status: AC
  Filled 2018-03-07: qty 2

## 2018-03-07 MED ORDER — FENTANYL CITRATE (PF) 100 MCG/2ML IJ SOLN
INTRAMUSCULAR | Status: AC
  Filled 2018-03-07: qty 2

## 2018-03-07 MED ORDER — TRAZODONE HCL 50 MG PO TABS
50.0000 mg | ORAL_TABLET | Freq: Once | ORAL | Status: AC
  Administered 2018-03-07: 50 mg via ORAL
  Filled 2018-03-07: qty 1

## 2018-03-07 NOTE — Progress Notes (Addendum)
IP PROGRESS NOTE  Subjective:   He complains of dyspnea this morning.  No bleeding.  Objective: Vital signs in last 24 hours: Blood pressure 140/76, pulse 96, temperature 97.9 F (36.6 C), temperature source Oral, resp. rate (!) 25, height 5' 9"  (1.753 m), weight 210 lb (95.3 kg), SpO2 92 %.  Intake/Output from previous day: 01/27 0701 - 01/28 0700 In: 240 [P.O.:240] Out: -   Physical Exam:  HEENT: Oral cavity without bleeding or thrush Lungs: Mild wheeze at the anterior chest bilaterally, distant breath sounds Cardiac: Regular rate and rhythm Abdomen: No hepatosplenomegaly Extremities: No leg edema Skin: Multiple ecchymoses over the arms and hands, oozing at the right forearm IV site    Lab Results: Recent Labs    03/05/18 1636 03/05/18 2221 03/06/18 0341  WBC 7.5  --  7.9  HGB 12.2*  --  11.6*  HCT 37.2*  --  36.1*  PLT 8* 8* 7*    BMET Recent Labs    03/05/18 1636 03/05/18 1752 03/06/18 0341  NA 141  --  143  K 2.8*  --  3.2*  CL 104  --  102  CO2 24  --  26  GLUCOSE 106*  --  113*  BUN 14  --  15  CREATININE 1.45* 1.40* 1.58*  CALCIUM 9.7  --  10.5*  Bilirubin 2.0, creatinine 1.4, LDH 540  No results found for: CEA1  Studies/Results: Dg Chest 2 View  Result Date: 03/05/2018 CLINICAL DATA:  Short of breath.  Recent pneumonia EXAM: CHEST - 2 VIEW COMPARISON:  None. FINDINGS: Sternotomy wires overlie normal cardiac silhouette. Normal pulmonary vasculature. No effusion, infiltrate, or pneumothorax. No acute osseous abnormality. IMPRESSION: No acute cardiopulmonary process. Electronically Signed   By: Suzy Bouchard M.D.   On: 03/05/2018 15:11   Ct Angio Chest/abd/pel For Dissection W And/or W/wo  Addendum Date: 03/05/2018   ADDENDUM REPORT: 03/05/2018 20:17 ADDENDUM: Correction: There are several omissions in the body of the chest CT report due to recent change from table mike to speech microphone. Under cardiovascular, it should state NO large  central pulmonary embolus is noted. And likewise NO thoracic aortic aneurysm is identified. Under lungs/pleura common should state: As stated above, there is pulmonary consolidation in the superior segment of the left lower lobe abutting the pleura. A small right effusion and atelectasis is noted. Centrilobular emphysema is noted, upper lobe predominant. Findings discussed with Wyn Quaker. Electronically Signed   By: Ashley Royalty M.D.   On: 03/05/2018 20:17   Result Date: 03/05/2018 CLINICAL DATA:  Dyspnea on exertion. Recently diagnosed 12 5 discharge 39 EXAM: CT ANGIOGRAPHY CHEST, ABDOMEN AND PELVIS TECHNIQUE: Multidetector CT imaging through the chest, abdomen and pelvis was performed using the standard protocol during bolus administration of intravenous contrast. Multiplanar reconstructed images and MIPs were obtained and reviewed to evaluate the vascular anatomy. CONTRAST:  112m ISOVUE-370 IOPAMIDOL (ISOVUE-370) INJECTION 76% COMPARISON:  None. FINDINGS: CTA CHEST FINDINGS Cardiovascular: The unenhanced images through the chest demonstrate. Atherosclerosis of the thoracic aorta branch vessels are. Native main three-vessel coronary arteriosclerosis is noted with post CABG change present heart size is top normal effusion After IV contrast administration thoracic aortic aneurysm is identified. Linear streak artifacts are noted within the common carotid and subclavian veins emanating hyperdense contrast the adjacent left subclavian conclusive evidence dissection. Large central embolus is noted though the study is not tailored toward assessment emboli Mediastinum/Nodes: Normal sized thyroid gland without dominant mass. Mildly enlarged left tracheobronchial lymph nodes measuring  up to 12 mm short axis larger 2 cm short axis left hilar soft tissue density/lymph nodes noted. These may be reactive given what appear to pulmonary consolidations in the superior segment of the left lower lobe compatible with  pneumonia and/or atelectasis. Patent trachea and mainstem bronchi. The CT appearance of the esophagus is unremarkable. Lungs/Pleura: As stated above, there is pulmonary consolidation in superior segment of the lower lobe abutting the small right effusion and atelectasis. Centrilobular emphysema is, upper lobe. No pneumothorax. Musculoskeletal: No chest wall abnormality. No acute or significant osseous findings. Mild degenerative change along the thoracic spine. Median sternotomy sutures are in place. Review of the MIP images confirms the above findings. CTA ABDOMEN AND PELVIS FINDINGS VASCULAR Aorta: 4.5 cm infrarenal abdominal aortic aneurysm crescentic soft plaque along the posterior and left lateral wall series 8/92. Dissection. Celiac: Minimal atherosclerosis at the origin celiac axis patent branch vessels with atherosclerosis along the splenic artery dissection, aneurysm or significant stenosis. SMA: Patent with atherosclerotic origins. Occlusion or dissection aneurysm Renals: Single bilateral renal arteries atherosclerosis but fibromuscular dysplasia, stenosis, aneurysm or dissection identified. IMA: Patent Inflow: Patent without evidence of aneurysm, dissection, vasculitis or significant stenosis. Atherosclerosis common iliac arteries probable chronic calcified dissection involving the proximal left common iliac artery, series 8/233. Veins: No obvious venous abnormality within the limitations of this arterial phase study. Review of the MIP images confirms the above findings. NON-VASCULAR Hepatobiliary: No focal liver abnormality is seen. No gallstones, gallbladder wall thickening, or biliary dilatation. Pancreas: Mild ectasia of the pancreatic duct. No inflammation or dominant enhancing mass. Spleen: Normal Adrenals/Urinary Tract: Normal bilateral adrenal glands. Bilateral renal cysts without obstructive uropathy. No solid enhancing masses are noted. The dependent layering bladder calculi are identified.  Diverticulosis of the bladder is also noted at the dome of the bladder. Stomach/Bowel: Decompressed stomach with normal duodenal sweep and ligament of Treitz position. No acute bowel obstruction. Extensive colonic diverticulosis is noted along the descending sigmoid colon. Suggestion mild pericolonic inflammation along the mid sigmoid raises the possibility of a subtle sigmoid diverticulitis, series 8/250. Lymphatic: No chest, abdomen or pelvic adenopathy. Reproductive: Mildly enlarged prostate with peripheral zone calcifications. Other: No free air or free fluid. Musculoskeletal: Lumbar spondylosis with multilevel degenerative disc and endplate changes. Schmorl's node involving the superior endplate of L3. Review of the MIP images confirms the above findings. IMPRESSION: 1. 4.5 cm infrarenal abdominal aortic aneurysm with crescentic soft plaque along the posterior and left lateral wall. Patent branch vessels with atherosclerosis 2. No thoracic aortic aneurysm or dissection. No acute pulmonary embolus. 3. Left lower lobe pneumonia with with mediastinal and left hilar reactive adenopathy. Follow-up to assure resolution is suggested. 4. Bilateral renal cysts. 5. Descending and sigmoid colonic diverticulosis with subtle pericolonic fatty induration along the mid sigmoid raises the possibility of a mild uncomplicated sigmoid diverticulitis. Electronically Signed: By: Ashley Royalty M.D. On: 03/05/2018 19:57    Medications: I have reviewed the patient's current medications.  Assessment/Plan:  1.  Thrombocytopenia 2.  Left lung airspace consolidation-pneumonia? 3.  COPD 4.  History of coronary artery disease 5.  CHF 6.  BPH 7.  Admission with pneumonia December 2019 8.  Mild renal insufficiency 9.  Elevated liver enzymes 10.  Coagulopathy 11.  Mild elevation of the calcium level   Joseph Berg continues to have dyspnea, likely secondary to COPD.  He has persistent severe thrombocytopenia following the  platelet transfusion yesterday.  He is otherwise stable from a hematologic standpoint.  The chemistry panel  is also unchanged.  He has bruising/bleeding at IV puncture sites.  No other bleeding.  Joseph Berg will undergo a bone marrow biopsy this morning.  I will ask for a review of the bone marrow aspirate today.  Recommendations: 1.  Bone marrow aspirate and biopsy today 2.  Transfuse platelets for bleeding 3.  Follow-up on bone marrow aspirate today 4.  Dyspnea/COPD management per the medical service  Hematology will continue following Joseph Berg daily.   LOS: 1 day   [name], MD   03/06/2018, 8:55 AM

## 2018-03-07 NOTE — Progress Notes (Signed)
PT Cancellation Note  Patient Details Name: Joseph Berg MRN: [id number] DOB: 1933/05/16   Cancelled Treatment:    Reason Eval/Treat Not Completed: Medical issues which prohibited therapy.  Platelets are 11, will check at another time to see how pt is progressing.   Ramond Dial 03/07/2018, 2:17 PM   Mee Hives, PT MS Acute Rehab Dept. Number: [place]

## 2018-03-07 NOTE — Progress Notes (Addendum)
PROGRESS NOTE        PATIENT DETAILS Name: Joseph Berg Age: 85 y.o. Sex: male Date of Birth: 07-Jan-1934 Admit Date: 03/05/2018 Admitting Physician Phillips Grout, MD PYP:[id number], Rene Kocher, MD  Brief Narrative: Patient is a 85 y.o. male with history of COPD, chronic systolic heart failure, same-recently hospitalized from 01/11/2018-01/18/2018-for acute hypoxic respiratory failure secondary to multifocal pneumonia-presented to the ED with fatigue, found to have severe thrombocytopenia.  See below for further details.  Subjective: No major events overnight-he has some ecchymosis at the IV site and at the site of phlebotomy.  Denies any chest pain or shortness of breath.  Assessment/Plan: Severe thrombocytopenia: Etiology unclear-work-up in progress-hematology suspects that this may be a primary bone marrow issue-underwent bone marrow biopsy on 1/29.  Await further recommendations from hematology.  Was transfused 1 unit of pheresis platelets on 1/28.  Follow CBC  Left lobar pneumonia: Improved-afebrile-no leukocytosis-urine streptococcal antigen positive.  Continue Rocephin.  Respiratory virus panel was negative.    COPD: No evidence of exacerbation-continue bronchodilators.   Chronic systolic heart failure: Volume status is stable-continue Lasix.  Although he has a history of chronic systolic heart failure-his most recent echocardiogram showed normalization of EF.    CAD s/p CABG: No anginal symptoms-antiplatelets on hold due to severe thrombocytopenia.  CKD stage III: Creatinine close to usual baseline-follow.  Minimally elevated transaminases: No major abnormalities seen on CT of the abdomen-supportive care.  LFTs are slowly downtrending.  Debility/deconditioning: Suspect some amount of debility/deconditioning at baseline-but worsened due to above-noted acute issues.  PT evaluation appreciated-recommendations are for home health services.  OSA:  Continue CPAP nightly.  DVT Prophylaxis: SCD's  Code Status: Full code  Family Communication: Spouse at bedside  Disposition Plan: Remain inpatient-home health services on discharge-requires several more days of inpatient hospital stay.  Antimicrobial agents: Anti-infectives (From admission, onward)   Start     Dose/Rate Route Frequency Ordered Stop   03/05/18 2245  cefTRIAXone (ROCEPHIN) 1 g in sodium chloride 0.9 % 100 mL IVPB     1 g 200 mL/hr over 30 Minutes Intravenous Every 24 hours 03/05/18 2237 03/12/18 2244   03/05/18 2245  doxycycline (VIBRA-TABS) tablet 100 mg  Status:  Discontinued     100 mg Oral Every 12 hours 03/05/18 2237 03/06/18 1311   03/05/18 2245  metroNIDAZOLE (FLAGYL) IVPB 500 mg  Status:  Discontinued     500 mg 100 mL/hr over 60 Minutes Intravenous Every 8 hours 03/05/18 2237 03/06/18 1023   03/05/18 2030  cefTRIAXone (ROCEPHIN) 2 g in sodium chloride 0.9 % 100 mL IVPB     2 g 200 mL/hr over 30 Minutes Intravenous  Once 03/05/18 2016 03/05/18 2128   03/05/18 2030  metroNIDAZOLE (FLAGYL) IVPB 500 mg     500 mg 100 mL/hr over 60 Minutes Intravenous  Once 03/05/18 2016 03/05/18 2257   03/05/18 2030  doxycycline (VIBRA-TABS) tablet 100 mg     100 mg Oral  Once 03/05/18 2016 03/05/18 2056      Procedures: None  CONSULTS:  hematology/oncology  Time spent: 25- minutes-Greater than 50% of this time was spent in counseling, explanation of diagnosis, planning of further management, and coordination of care.  MEDICATIONS: Scheduled Meds: . carvedilol  12.5 mg Oral BID WC  . fentaNYL      . fluticasone furoate-vilanterol  1 puff Inhalation  Daily  . furosemide  40 mg Oral Daily  . lidocaine      . mouth rinse  15 mL Mouth Rinse BID  . midazolam      . multivitamin with minerals  1 tablet Oral Daily  . rosuvastatin  40 mg Oral Daily  . sodium chloride flush  3 mL Intravenous Q12H  . umeclidinium bromide  1 puff Inhalation Daily   Continuous  Infusions: . sodium chloride 250 mL (03/06/18 2263)  . cefTRIAXone (ROCEPHIN)  IV 1 g (03/06/18 2148)   PRN Meds:.sodium chloride, albuterol, HYDROcodone-acetaminophen, ipratropium-albuterol, senna-docusate, sodium chloride flush   PHYSICAL EXAM: Vital signs: Vitals:   03/07/18 1045 03/07/18 1050 03/07/18 1117 03/07/18 1228  BP: (!) 133/91 132/77 126/74 121/70  Pulse: 95 95 93 92  Resp:  (!) 25 20   Temp:   98.4 F (36.9 C)   TempSrc:   Oral   SpO2: 96% 96% 92% 93%  Weight:      Height:       Filed Weights   03/05/18 1610  Weight: 95.3 kg   Body mass index is 31.01 kg/m.   General appearance:Awake, alert, not in any distress.  Eyes:no scleral icterus. HEENT: Atraumatic and Normocephalic Neck: supple, no JVD. Resp:Good air entry bilaterally,few scattered rhonchi CVS: S1 S2 regular, no murmurs.  GI: Bowel sounds present, Non tender and not distended with no gaurding, rigidity or rebound. Extremities: B/L Lower Ext shows no edema, both legs are warm to touch Neurology:  Non focal Psychiatric: Normal judgment and insight. Normal mood. Musculoskeletal:No digital cyanosis Skin:No Rash, warm and dry Wounds:N/A  I have personally reviewed following labs and imaging studies  LABORATORY DATA: CBC: Recent Labs  Lab 03/05/18 1636 03/05/18 2221 03/06/18 0341 03/06/18 1900 03/07/18 0344  WBC 7.5  --  7.9  --  7.8  NEUTROABS 4.7  --  4.8  --   --   HGB 12.2*  --  11.6*  --  11.0*  HCT 37.2*  --  36.1*  --  34.2*  MCV 91.9  --  89.6  --  90.5  PLT 8* 8* 7* 18* 11*    Basic Metabolic Panel: Recent Labs  Lab 03/05/18 1636 03/05/18 1752 03/05/18 2030 03/06/18 0341 03/07/18 0344  NA 141  --   --  143 144  K 2.8*  --   --  3.2* 3.6  CL 104  --   --  102 105  CO2 24  --   --  26 26  GLUCOSE 106*  --   --  113* 119*  BUN 14  --   --  15 17  CREATININE 1.45* 1.40*  --  1.58* 1.40*  CALCIUM 9.7  --   --  10.5* 10.6*  MG  --   --  1.7  --   --     GFR: Estimated  Creatinine Clearance: 46.3 mL/min (A) (by C-G formula based on SCr of 1.4 mg/dL (H)).  Liver Function Tests: Recent Labs  Lab 03/05/18 1636 03/06/18 0341 03/07/18 0344  AST 106* 111* 111*  ALT 46* 49* 46*  ALKPHOS 139* 156* 156*  BILITOT 2.1* 2.0* 2.0*  PROT 5.3* 5.7* 5.6*  ALBUMIN 2.7* 2.9* 2.8*   No results for input(s): LIPASE, AMYLASE in the last 168 hours. No results for input(s): AMMONIA in the last 168 hours.  Coagulation Profile: Recent Labs  Lab 03/05/18 2221  INR 1.25    Cardiac Enzymes: No results for input(s): CKTOTAL, CKMB,  CKMBINDEX, TROPONINI in the last 168 hours.  BNP (last 3 results) No results for input(s): PROBNP in the last 8760 hours.  HbA1C: No results for input(s): HGBA1C in the last 72 hours.  CBG: No results for input(s): GLUCAP in the last 168 hours.  Lipid Profile: No results for input(s): CHOL, HDL, LDLCALC, TRIG, CHOLHDL, LDLDIRECT in the last 72 hours.  Thyroid Function Tests: No results for input(s): TSH, T4TOTAL, FREET4, T3FREE, THYROIDAB in the last 72 hours.  Anemia Panel: Recent Labs    03/05/18 2242 03/06/18 0731  VITAMINB12  --  1,696*  RETICCTPCT 2.5  --     Urine analysis:    Component Value Date/Time   COLORURINE YELLOW 03/05/2018 2038   APPEARANCEUR CLEAR 03/05/2018 2038   LABSPEC 1.023 03/05/2018 2038   PHURINE 6.0 03/05/2018 2038   GLUCOSEU NEGATIVE 03/05/2018 2038   HGBUR MODERATE (A) 03/05/2018 2038   BILIRUBINUR NEGATIVE 03/05/2018 2038   BILIRUBINUR neg 10/31/2011 1121   KETONESUR NEGATIVE 03/05/2018 2038   PROTEINUR NEGATIVE 03/05/2018 2038   UROBILINOGEN 2.0 (H) 08/09/2012 1316   NITRITE NEGATIVE 03/05/2018 2038   LEUKOCYTESUR NEGATIVE 03/05/2018 2038    Sepsis Labs: Lactic Acid, Venous    Component Value Date/Time   LATICACIDVEN 1.84 01/11/2018 1155    MICROBIOLOGY: Recent Results (from the past 240 hour(s))  Urine culture     Status: None   Collection Time: 03/05/18  6:48 PM  Result  Value Ref Range Status   Specimen Description URINE, RANDOM  Final   Special Requests NONE  Final   Culture   Final    NO GROWTH Performed at [facility], [place] [address] [place], [place] [address]    Report Status 03/07/2018 FINAL  Final  Culture, blood (routine x 2)     Status: None (Preliminary result)   Collection Time: 03/05/18  8:38 PM  Result Value Ref Range Status   Specimen Description BLOOD RIGHT HAND  Final   Special Requests   Final    BOTTLES DRAWN AEROBIC AND ANAEROBIC Blood Culture results may not be optimal due to an inadequate volume of blood received in culture bottles   Culture   Final    NO GROWTH 2 DAYS Performed at [facility], [place] [address] [place], [place] [address]    Report Status PENDING  Incomplete  Culture, blood (routine x 2)     Status: None (Preliminary result)   Collection Time: 03/05/18  8:39 PM  Result Value Ref Range Status   Specimen Description BLOOD LEFT HAND  Final   Special Requests   Final    BOTTLES DRAWN AEROBIC ONLY Blood Culture results may not be optimal due to an inadequate volume of blood received in culture bottles   Culture   Final    NO GROWTH 2 DAYS Performed at [facility], [place] [address] [place], [place] [address]    Report Status PENDING  Incomplete  MRSA PCR Screening     Status: None   Collection Time: 03/06/18  1:36 AM  Result Value Ref Range Status   MRSA by PCR NEGATIVE NEGATIVE Final    Comment:        The GeneXpert MRSA Assay (FDA approved for NASAL specimens only), is one component of a comprehensive MRSA colonization surveillance program. It is not intended to diagnose MRSA infection nor to guide or monitor treatment for MRSA infections. Performed at [facility], [place] [address], [place], [place] [address]  Respiratory Panel by PCR     Status: None   Collection Time: 03/06/18  1:58 AM  Result Value Ref Range Status   Adenovirus NOT DETECTED NOT DETECTED Final   Coronavirus  229E NOT DETECTED NOT DETECTED Final   Coronavirus HKU1 NOT DETECTED NOT DETECTED Final   Coronavirus NL63 NOT DETECTED NOT DETECTED Final   Coronavirus OC43 NOT DETECTED NOT DETECTED Final   Metapneumovirus NOT DETECTED NOT DETECTED Final   Rhinovirus / Enterovirus NOT DETECTED NOT DETECTED Final   Influenza A NOT DETECTED NOT DETECTED Final   Influenza B NOT DETECTED NOT DETECTED Final   Parainfluenza Virus 1 NOT DETECTED NOT DETECTED Final   Parainfluenza Virus 2 NOT DETECTED NOT DETECTED Final   Parainfluenza Virus 3 NOT DETECTED NOT DETECTED Final   Parainfluenza Virus 4 NOT DETECTED NOT DETECTED Final   Respiratory Syncytial Virus NOT DETECTED NOT DETECTED Final   Bordetella pertussis NOT DETECTED NOT DETECTED Final   Chlamydophila pneumoniae NOT DETECTED NOT DETECTED Final   Mycoplasma pneumoniae NOT DETECTED NOT DETECTED Final    Comment: Performed at [facility], [place] [address], [place], [place] [address]    RADIOLOGY STUDIES/RESULTS: Dg Chest 2 View  Result Date: 03/05/2018 CLINICAL DATA:  Short of breath.  Recent pneumonia EXAM: CHEST - 2 VIEW COMPARISON:  None. FINDINGS: Sternotomy wires overlie normal cardiac silhouette. Normal pulmonary vasculature. No effusion, infiltrate, or pneumothorax. No acute osseous abnormality. IMPRESSION: No acute cardiopulmonary process. Electronically Signed   By: Suzy Bouchard M.D.   On: 03/05/2018 15:11   Dg Chest 2 View  Result Date: 02/14/2018 CLINICAL DATA:  COPD is aspiration.  Follow-up pneumonia. EXAM: CHEST - 2 VIEW COMPARISON:  01/15/2018 FINDINGS: Borderline cardiomegaly. Normal vascularity. Bilateral central and basilar airspace opacities have improved. Some disease persists. Postoperative changes and sternotomy are noted. No pneumothorax. No pleural effusion. IMPRESSION: Improving bilateral airspace opacities. Electronically Signed   By: Marybelle Killings M.D.   On: 02/14/2018 11:02   Ct Angio Chest/abd/pel For Dissection W  And/or W/wo  Addendum Date: 03/05/2018   ADDENDUM REPORT: 03/05/2018 20:17 ADDENDUM: Correction: There are several omissions in the body of the chest CT report due to recent change from table mike to speech microphone. Under cardiovascular, it should state NO large central pulmonary embolus is noted. And likewise NO thoracic aortic aneurysm is identified. Under lungs/pleura common should state: As stated above, there is pulmonary consolidation in the superior segment of the left lower lobe abutting the pleura. A small right effusion and atelectasis is noted. Centrilobular emphysema is noted, upper lobe predominant. Findings discussed with Wyn Quaker. Electronically Signed   By: Ashley Royalty M.D.   On: 03/05/2018 20:17   Result Date: 03/05/2018 CLINICAL DATA:  Dyspnea on exertion. Recently diagnosed 12 5 discharge 37 EXAM: CT ANGIOGRAPHY CHEST, ABDOMEN AND PELVIS TECHNIQUE: Multidetector CT imaging through the chest, abdomen and pelvis was performed using the standard protocol during bolus administration of intravenous contrast. Multiplanar reconstructed images and MIPs were obtained and reviewed to evaluate the vascular anatomy. CONTRAST:  179m ISOVUE-370 IOPAMIDOL (ISOVUE-370) INJECTION 76% COMPARISON:  None. FINDINGS: CTA CHEST FINDINGS Cardiovascular: The unenhanced images through the chest demonstrate. Atherosclerosis of the thoracic aorta branch vessels are. Native main three-vessel coronary arteriosclerosis is noted with post CABG change present heart size is top normal effusion After IV contrast administration thoracic aortic aneurysm is identified. Linear streak artifacts are noted within the common carotid and subclavian veins emanating hyperdense contrast the adjacent left subclavian  conclusive evidence dissection. Large central embolus is noted though the study is not tailored toward assessment emboli Mediastinum/Nodes: Normal sized thyroid gland without dominant mass. Mildly enlarged left  tracheobronchial lymph nodes measuring up to 12 mm short axis larger 2 cm short axis left hilar soft tissue density/lymph nodes noted. These may be reactive given what appear to pulmonary consolidations in the superior segment of the left lower lobe compatible with pneumonia and/or atelectasis. Patent trachea and mainstem bronchi. The CT appearance of the esophagus is unremarkable. Lungs/Pleura: As stated above, there is pulmonary consolidation in superior segment of the lower lobe abutting the small right effusion and atelectasis. Centrilobular emphysema is, upper lobe. No pneumothorax. Musculoskeletal: No chest wall abnormality. No acute or significant osseous findings. Mild degenerative change along the thoracic spine. Median sternotomy sutures are in place. Review of the MIP images confirms the above findings. CTA ABDOMEN AND PELVIS FINDINGS VASCULAR Aorta: 4.5 cm infrarenal abdominal aortic aneurysm crescentic soft plaque along the posterior and left lateral wall series 8/92. Dissection. Celiac: Minimal atherosclerosis at the origin celiac axis patent branch vessels with atherosclerosis along the splenic artery dissection, aneurysm or significant stenosis. SMA: Patent with atherosclerotic origins. Occlusion or dissection aneurysm Renals: Single bilateral renal arteries atherosclerosis but fibromuscular dysplasia, stenosis, aneurysm or dissection identified. IMA: Patent Inflow: Patent without evidence of aneurysm, dissection, vasculitis or significant stenosis. Atherosclerosis common iliac arteries probable chronic calcified dissection involving the proximal left common iliac artery, series 8/233. Veins: No obvious venous abnormality within the limitations of this arterial phase study. Review of the MIP images confirms the above findings. NON-VASCULAR Hepatobiliary: No focal liver abnormality is seen. No gallstones, gallbladder wall thickening, or biliary dilatation. Pancreas: Mild ectasia of the pancreatic  duct. No inflammation or dominant enhancing mass. Spleen: Normal Adrenals/Urinary Tract: Normal bilateral adrenal glands. Bilateral renal cysts without obstructive uropathy. No solid enhancing masses are noted. The dependent layering bladder calculi are identified. Diverticulosis of the bladder is also noted at the dome of the bladder. Stomach/Bowel: Decompressed stomach with normal duodenal sweep and ligament of Treitz position. No acute bowel obstruction. Extensive colonic diverticulosis is noted along the descending sigmoid colon. Suggestion mild pericolonic inflammation along the mid sigmoid raises the possibility of a subtle sigmoid diverticulitis, series 8/250. Lymphatic: No chest, abdomen or pelvic adenopathy. Reproductive: Mildly enlarged prostate with peripheral zone calcifications. Other: No free air or free fluid. Musculoskeletal: Lumbar spondylosis with multilevel degenerative disc and endplate changes. Schmorl's node involving the superior endplate of L3. Review of the MIP images confirms the above findings. IMPRESSION: 1. 4.5 cm infrarenal abdominal aortic aneurysm with crescentic soft plaque along the posterior and left lateral wall. Patent branch vessels with atherosclerosis 2. No thoracic aortic aneurysm or dissection. No acute pulmonary embolus. 3. Left lower lobe pneumonia with with mediastinal and left hilar reactive adenopathy. Follow-up to assure resolution is suggested. 4. Bilateral renal cysts. 5. Descending and sigmoid colonic diverticulosis with subtle pericolonic fatty induration along the mid sigmoid raises the possibility of a mild uncomplicated sigmoid diverticulitis. Electronically Signed: By: Ashley Royalty M.D. On: 03/05/2018 19:57     LOS: 2 days   Oren Binet, MD  [facility]  If 7PM-7AM, please contact night-coverage  Please page via [email or web address] [email or web address]-click on MD name and type text message  03/07/2018, 1:34 PM

## 2018-03-07 NOTE — Progress Notes (Signed)
  Speech Language Pathology Treatment: Dysphagia  Patient Details Name: Joseph Berg MRN: [id number] DOB: [birth year] Today's Date: 03/07/2018 Time: 3785-8850 SLP Time Calculation (min) (ACUTE ONLY): 9 min  Assessment / Plan / Recommendation Clinical Impression  Pt consumed regular textures and thin liquids without overt signs of aspiration. Swallow appeared grossly functional as can be assessed at bedside and he has no subjective complaints. Recommend continuing regular diet and thin liquids. Aspiration precautions were reinforced. SLP to sign off.   HPI HPI: Pt is an 85 y.o. male admitted 03/05/18 with progressive SOB. Pt found to have thrombocytopenia of unclear etiology, awaiting bone marrow biopsy. CXR concerning for LLL PNA. CT noted diverticulitis. PMH includes COPD, CHF, CAD, AAA, recent admission 01/2018 with PNA. BSE in February 2018 recommended regular diet, thin liquids.       SLP Plan  All goals met       Recommendations  Diet recommendations: Regular;Thin liquid Liquids provided via: Cup;Straw Medication Administration: Whole meds with liquid Supervision: Patient able to self feed;Intermittent supervision to cue for compensatory strategies Compensations: Small sips/bites;Slow rate Postural Changes and/or Swallow Maneuvers: Seated upright 90 degrees                Oral Care Recommendations: Oral care BID Follow up Recommendations: None SLP Visit Diagnosis: Dysphagia, unspecified (R13.10) Plan: All goals met       GO                Joseph Berg 03/07/2018, 4:22 PM  Nuala Alpha, M.A. [place] Acute Environmental education officer [phone] Office [phone]

## 2018-03-07 NOTE — Procedures (Signed)
  Procedure: CT bone marrow biopsy   EBL:   minimal Complications:  none immediate  See full dictation in [facility].  Dillard Cannon MD Main # [phone] Pager  [phone]

## 2018-03-07 NOTE — Progress Notes (Signed)
SLP Cancellation Note  Patient Details Name: Joseph Berg MRN: [id number] DOB: 09/30/1933   Cancelled treatment:       Reason Eval/Treat Not Completed: Medical issues which prohibited therapy. Per chart review, pt is NPO pending possible biopsy today. Will f/u for PO tolerance as able.   Venita Sheffield Joelene Barriere 03/07/2018, 10:12 AM  Nuala Alpha, M.A. [place] Acute Environmental education officer [phone] Office [phone]

## 2018-03-08 ENCOUNTER — Inpatient Hospital Stay: Payer: Self-pay

## 2018-03-08 LAB — CBC
HCT: 34.3 % — ABNORMAL LOW (ref 39.0–52.0)
Hemoglobin: 10.9 g/dL — ABNORMAL LOW (ref 13.0–17.0)
MCH: 29.2 pg (ref 26.0–34.0)
MCHC: 31.8 g/dL (ref 30.0–36.0)
MCV: 92 fL (ref 80.0–100.0)
Platelets: 6 10*3/uL — CL (ref 150–400)
RBC: 3.73 MIL/uL — ABNORMAL LOW (ref 4.22–5.81)
RDW: 14.9 % (ref 11.5–15.5)
WBC: 6.9 10*3/uL (ref 4.0–10.5)
nRBC: 1.6 % — ABNORMAL HIGH (ref 0.0–0.2)

## 2018-03-08 LAB — COMPREHENSIVE METABOLIC PANEL
ALT: 46 U/L — AB (ref 0–44)
AST: 116 U/L — ABNORMAL HIGH (ref 15–41)
Albumin: 2.8 g/dL — ABNORMAL LOW (ref 3.5–5.0)
Alkaline Phosphatase: 162 U/L — ABNORMAL HIGH (ref 38–126)
Anion gap: 11 (ref 5–15)
BUN: 17 mg/dL (ref 8–23)
CO2: 31 mmol/L (ref 22–32)
CREATININE: 1.3 mg/dL — AB (ref 0.61–1.24)
Calcium: 10.5 mg/dL — ABNORMAL HIGH (ref 8.9–10.3)
Chloride: 102 mmol/L (ref 98–111)
GFR calc Af Amer: 59 mL/min — ABNORMAL LOW (ref >60)
GFR calc non Af Amer: 51 mL/min — ABNORMAL LOW (ref >60)
GLUCOSE: 110 mg/dL — AB (ref 70–99)
Potassium: 3.2 mmol/L — ABNORMAL LOW (ref 3.5–5.1)
Sodium: 144 mmol/L (ref 135–145)
Total Bilirubin: 3 mg/dL — ABNORMAL HIGH (ref 0.3–1.2)
Total Protein: 5.6 g/dL — ABNORMAL LOW (ref 6.5–8.1)

## 2018-03-08 MED ORDER — SODIUM CHLORIDE 0.9 % IV SOLN
2.0000 g | INTRAVENOUS | Status: DC
  Administered 2018-03-08 – 2018-03-09 (×2): 2 g via INTRAVENOUS
  Filled 2018-03-08: qty 20
  Filled 2018-03-08 (×2): qty 2

## 2018-03-08 MED ORDER — TRAZODONE HCL 50 MG PO TABS
50.0000 mg | ORAL_TABLET | Freq: Every evening | ORAL | Status: DC | PRN
  Administered 2018-03-08 – 2018-03-19 (×12): 50 mg via ORAL
  Filled 2018-03-08 (×12): qty 1

## 2018-03-08 MED ORDER — SODIUM CHLORIDE 0.9% IV SOLUTION
Freq: Once | INTRAVENOUS | Status: AC
  Administered 2018-03-08: 15:00:00 via INTRAVENOUS

## 2018-03-08 MED ORDER — SODIUM CHLORIDE 0.9% FLUSH
10.0000 mL | INTRAVENOUS | Status: DC | PRN
  Administered 2018-03-20: 10 mL
  Filled 2018-03-08: qty 40

## 2018-03-08 MED ORDER — POTASSIUM CHLORIDE CRYS ER 20 MEQ PO TBCR
40.0000 meq | EXTENDED_RELEASE_TABLET | Freq: Once | ORAL | Status: AC
  Administered 2018-03-08: 40 meq via ORAL
  Filled 2018-03-08: qty 2

## 2018-03-08 NOTE — Progress Notes (Signed)
Joseph Berg to be D/C'd to Joseph Berg per MD order. Discussed with the patient and all questions fully answered.   VVS, Skin clean, dry and intact without evidence of skin break down, no evidence of skin tears noted.  IV catheter discontinued intact. Site without signs and symptoms of complications. Dressing and pressure applied. Pt discharged with Right PICC in place. An After Visit Summary was printed and given to transport.  Patient escorted via stretcherVictoria A Machell Wirthlin  03/08/2018 7:52 PM

## 2018-03-08 NOTE — Care Management Important Message (Signed)
Important Message  Patient Details  Name: Joseph Berg MRN: [id number] Date of Birth: 18-Feb-1933   Medicare Important Message Given:  Yes    [name] 03/08/2018, 4:02 PM

## 2018-03-08 NOTE — Progress Notes (Signed)
Peripherally Inserted Central Catheter/Midline Placement  The IV Nurse has discussed with the patient and/or persons authorized to consent for the patient, the purpose of this procedure and the potential benefits and risks involved with this procedure.  The benefits include less needle sticks, lab draws from the catheter, and the patient may be discharged home with the catheter. Risks include, but not limited to, infection, bleeding, blood clot (thrombus formation), and puncture of an artery; nerve damage and irregular heartbeat and possibility to perform a PICC exchange if needed/ordered by physician.  Alternatives to this procedure were also discussed.  Bard Power PICC patient education guide, fact sheet on infection prevention and patient information card has been provided to patient /or left at bedside.    PICC/Midline Placement Documentation  PICC Double Lumen 29/92/42 PICC Right Basilic 42 cm 0 cm (Active)  Indication for Insertion or Continuance of Line Prolonged intravenous therapies 03/08/2018  3:50 PM  Exposed Catheter (cm) 0 cm 03/08/2018  3:50 PM  Site Assessment Clean;Dry;Intact 03/08/2018  3:50 PM  Lumen #1 Status Flushed;Blood return noted 03/08/2018  3:50 PM  Lumen #2 Status Flushed;Blood return noted 03/08/2018  3:50 PM  Dressing Type Transparent 03/08/2018  3:50 PM  Dressing Status Clean;Antimicrobial disc in place;Intact;Dry 03/08/2018  3:50 PM  Dressing Intervention New dressing 03/08/2018  3:50 PM  Dressing Change Due 03/15/18 03/08/2018  3:50 PM       Joseph Berg 03/08/2018, 3:51 PM

## 2018-03-08 NOTE — Progress Notes (Signed)
Report called to Elvina Sidle [phone]). Gave report to Haskins.

## 2018-03-08 NOTE — Progress Notes (Signed)
PROGRESS NOTE        PATIENT DETAILS Name: Joseph Berg Age: 85 y.o. Sex: male Date of Birth: Jan 24, 1934 Admit Date: 03/05/2018 Admitting Physician Phillips Grout, MD [id number], Rene Kocher, MD  Brief Narrative: Patient is a 85 y.o. male with history of COPD, chronic systolic heart failure, same-recently hospitalized from 01/11/2018-01/18/2018-for acute hypoxic respiratory failure secondary to multifocal pneumonia-presented to the ED with fatigue, found to have left lower lobe pneumonia and severe thrombocytopenia.  Evaluated by hematology, subsequently underwent bone marrow biopsy-unfortunately it appears that the patient has small cell carcinoma (most likely lung) with bone marrow involvement causing thrombocytopenia.  Recommendations from hematology are to transfer to [facility] long for consideration of chemotherapy.  See below for further details.  Subjective: Continues to have some ecchymosis at the IV site in the site of phlebotomy-otherwise no other foci of bleeding.  Lying comfortably in bed denies any chest pain.  Assessment/Plan: Severe thrombocytopenia secondary to bone marrow involvement from extensive stage small cell carcinoma of the lung: No overt bleeding-transfused 1 unit of platelets on 1/28-hematology recommending another unit of transfusion on 1/30.  Very difficult situation-appears to have stage small cell cancer of the lung-after extensive discussion with family-plans are to transfer to [facility] long hospital for consideration of chemotherapy.  Oncology continues to follow.  Continue to follow CBC.    Extensive stage small cell carcinoma of the lung: See above-suspect that consolidation seen in the lung on imaging studies-is likely malignant-he may have some mild postobstructive pneumonia-as urine streptococcal antigen is positive.  Oncology following-with plans to transfer to [facility] long for consideration of chemotherapy.  Long discussion with  patient/spouse-we talked about continuing with current plans to transfer to [facility] long-I did bring up the issue of hospice care at some point-for now plans are to continue with oncology treatment and see how he does.  If he does not do well over the next few days-may need to consider palliative care evaluation.  Left lobar pneumonia: Initially thought to just have pneumonia-however given bone marrow biopsy findings-high suspicion that consolidation of the lung seen in imaging studies might be malignancy instead.  Patient's urine streptococcal antigen is positive-with plan for at least 5 days of IV Rocephin.  Respiratory virus panel was negative.    COPD: No evidence of exacerbation-continue bronchodilators  Chronic systolic heart failure: Euvolemic-continue Lasix.  Although he has a history of chronic systolic heart failure-his most recent echocardiogram showed normalization of EF.    CAD s/p CABG: No anginal symptoms-antiplatelets on hold due to severe thrombocytopenia.  CKD stage III: Creatinine close to usual baseline-follow.  Minimally elevated transaminases: No major abnormalities seen on CT of the abdomen-supportive care.  LFTs are slowly downtrending.  Debility/deconditioning: Suspect some amount of debility/deconditioning at baseline-but worsened due to above-noted acute issues.  PT evaluation appreciated-recommendations are for home health services.  OSA: Continue CPAP nightly.  DVT Prophylaxis: SCD's  Code Status: Full code  Family Communication: Spouse at bedside  Disposition Plan: Remain inpatient-transfer to [facility] long hospital  Antimicrobial agents: Anti-infectives (From admission, onward)   Start     Dose/Rate Route Frequency Ordered Stop   03/08/18 2245  cefTRIAXone (ROCEPHIN) 2 g in sodium chloride 0.9 % 100 mL IVPB     2 g 200 mL/hr over 30 Minutes Intravenous Every 24 hours 03/08/18 1124 03/12/18 2244   03/05/18 2245  cefTRIAXone (ROCEPHIN) 1 g in sodium  chloride 0.9 % 100 mL IVPB  Status:  Discontinued     1 g 200 mL/hr over 30 Minutes Intravenous Every 24 hours 03/05/18 2237 03/08/18 1124   03/05/18 2245  doxycycline (VIBRA-TABS) tablet 100 mg  Status:  Discontinued     100 mg Oral Every 12 hours 03/05/18 2237 03/06/18 1311   03/05/18 2245  metroNIDAZOLE (FLAGYL) IVPB 500 mg  Status:  Discontinued     500 mg 100 mL/hr over 60 Minutes Intravenous Every 8 hours 03/05/18 2237 03/06/18 1023   03/05/18 2030  cefTRIAXone (ROCEPHIN) 2 g in sodium chloride 0.9 % 100 mL IVPB     2 g 200 mL/hr over 30 Minutes Intravenous  Once 03/05/18 2016 03/05/18 2128   03/05/18 2030  metroNIDAZOLE (FLAGYL) IVPB 500 mg     500 mg 100 mL/hr over 60 Minutes Intravenous  Once 03/05/18 2016 03/05/18 2257   03/05/18 2030  doxycycline (VIBRA-TABS) tablet 100 mg     100 mg Oral  Once 03/05/18 2016 03/05/18 2056      Procedures: 1/29>> bone marrow biopsy  CONSULTS:  hematology/oncology  Time spent: 35- minutes-Greater than 50% of this time was spent in counseling, explanation of diagnosis, planning of further management, and coordination of care.  MEDICATIONS: Scheduled Meds: . sodium chloride   Intravenous Once  . carvedilol  12.5 mg Oral BID WC  . fluticasone furoate-vilanterol  1 puff Inhalation Daily  . furosemide  40 mg Oral Daily  . mouth rinse  15 mL Mouth Rinse BID  . multivitamin with minerals  1 tablet Oral Daily  . rosuvastatin  40 mg Oral Daily  . sodium chloride flush  3 mL Intravenous Q12H  . umeclidinium bromide  1 puff Inhalation Daily   Continuous Infusions: . sodium chloride 250 mL (03/06/18 9563)  . cefTRIAXone (ROCEPHIN)  IV     PRN Meds:.sodium chloride, albuterol, HYDROcodone-acetaminophen, ipratropium-albuterol, senna-docusate, sodium chloride flush   PHYSICAL EXAM: Vital signs: Vitals:   03/07/18 1812 03/07/18 2159 03/08/18 0456 03/08/18 0858  BP: (!) 147/75 125/86 (!) 153/81   Pulse: 87 82 83   Resp:  (!) 24 (!) 28     Temp:  98.6 F (37 C) 97.8 F (36.6 C)   TempSrc:  Oral Oral   SpO2:  93% 93% 92%  Weight:      Height:       Filed Weights   03/05/18 1610  Weight: 95.3 kg   Body mass index is 31.01 kg/m.   General appearance:Awake, alert, not in any distress.  Eyes:no scleral icterus. HEENT: Atraumatic and Normocephalic Neck: supple, no JVD. Resp:Good air entry bilaterally,no rales or rhonchi CVS: S1 S2 regular, no murmurs.  GI: Bowel sounds present, Non tender and not distended with no gaurding, rigidity or rebound. Extremities: B/L Lower Ext shows no edema, both legs are warm to touch Neurology:  Non focal Psychiatric: Normal judgment and insight. Normal mood. Musculoskeletal:No digital cyanosis Skin:No Rash, warm and dry Wounds:N/A  I have personally reviewed following labs and imaging studies  LABORATORY DATA: CBC: Recent Labs  Lab 03/05/18 1636 03/05/18 2221 03/06/18 0341 03/06/18 1900 03/07/18 0344 03/08/18 0406  WBC 7.5  --  7.9  --  7.8 6.9  NEUTROABS 4.7  --  4.8  --   --   --   HGB 12.2*  --  11.6*  --  11.0* 10.9*  HCT 37.2*  --  36.1*  --  34.2* 34.3*  MCV 91.9  --  89.6  --  90.5 92.0  PLT 8* 8* 7* 18* 11* 6*    Basic Metabolic Panel: Recent Labs  Lab 03/05/18 1636 03/05/18 1752 03/05/18 2030 03/06/18 0341 03/07/18 0344 03/08/18 0406  NA 141  --   --  143 144 144  K 2.8*  --   --  3.2* 3.6 3.2*  CL 104  --   --  102 105 102  CO2 24  --   --  _0 GLUCOSE 106*  --   --  113* 119* 110*  BUN 14  --   --  _1 CREATININE 1.45* 1.40*  --  1.58* 1.40* 1.30*  CALCIUM 9.7  --   --  10.5* 10.6* 10.5*  MG  --   --  1.7  --   --   --     GFR: Estimated Creatinine Clearance: 49.9 mL/min (A) (by C-G formula based on SCr of 1.3 mg/dL (H)).  Liver Function Tests: Recent Labs  Lab 03/05/18 1636 03/06/18 0341 03/07/18 0344 03/08/18 0406  AST 106* 111* 111* 116*  ALT 46* 49* 46* 46*  ALKPHOS 139* 156* 156* 162*  BILITOT 2.1* 2.0* 2.0* 3.0*   PROT 5.3* 5.7* 5.6* 5.6*  ALBUMIN 2.7* 2.9* 2.8* 2.8*   No results for input(s): LIPASE, AMYLASE in the last 168 hours. No results for input(s): AMMONIA in the last 168 hours.  Coagulation Profile: Recent Labs  Lab 03/05/18 2221  INR 1.25    Cardiac Enzymes: No results for input(s): CKTOTAL, CKMB, CKMBINDEX, TROPONINI in the last 168 hours.  BNP (last 3 results) No results for input(s): PROBNP in the last 8760 hours.  HbA1C: No results for input(s): HGBA1C in the last 72 hours.  CBG: No results for input(s): GLUCAP in the last 168 hours.  Lipid Profile: No results for input(s): CHOL, HDL, LDLCALC, TRIG, CHOLHDL, LDLDIRECT in the last 72 hours.  Thyroid Function Tests: No results for input(s): TSH, T4TOTAL, FREET4, T3FREE, THYROIDAB in the last 72 hours.  Anemia Panel: Recent Labs    03/05/18 2242 03/06/18 0731  VITAMINB12  --  1,696*  RETICCTPCT 2.5  --     Urine analysis:    Component Value Date/Time   COLORURINE YELLOW 03/05/2018 2038   APPEARANCEUR CLEAR 03/05/2018 2038   LABSPEC 1.023 03/05/2018 2038   PHURINE 6.0 03/05/2018 2038   GLUCOSEU NEGATIVE 03/05/2018 2038   HGBUR MODERATE (A) 03/05/2018 2038   BILIRUBINUR NEGATIVE 03/05/2018 2038   BILIRUBINUR neg 10/31/2011 1121   KETONESUR NEGATIVE 03/05/2018 2038   PROTEINUR NEGATIVE 03/05/2018 2038   UROBILINOGEN 2.0 (H) 08/09/2012 1316   NITRITE NEGATIVE 03/05/2018 2038   LEUKOCYTESUR NEGATIVE 03/05/2018 2038    Sepsis Labs: Lactic Acid, Venous    Component Value Date/Time   LATICACIDVEN 1.84 01/11/2018 1155    MICROBIOLOGY: Recent Results (from the past 240 hour(s))  Urine culture     Status: None   Collection Time: 03/05/18  6:48 PM  Result Value Ref Range Status   Specimen Description URINE, RANDOM  Final   Special Requests NONE  Final   Culture   Final    NO GROWTH Performed at [facility], [place] [address] [place], [place] [address]    Report Status 03/07/2018 FINAL  Final   Culture, blood (routine x 2)     Status: None (Preliminary result)   Collection Time: 03/05/18  8:38 PM  Result Value Ref Range Status  Specimen Description BLOOD RIGHT HAND  Final   Special Requests   Final    BOTTLES DRAWN AEROBIC AND ANAEROBIC Blood Culture results may not be optimal due to an inadequate volume of blood received in culture bottles   Culture   Final    NO GROWTH 3 DAYS Performed at [facility], [place] [address], [place], [place] [address]    Report Status PENDING  Incomplete  Culture, blood (routine x 2)     Status: None (Preliminary result)   Collection Time: 03/05/18  8:39 PM  Result Value Ref Range Status   Specimen Description BLOOD LEFT HAND  Final   Special Requests   Final    BOTTLES DRAWN AEROBIC ONLY Blood Culture results may not be optimal due to an inadequate volume of blood received in culture bottles   Culture   Final    NO GROWTH 3 DAYS Performed at [facility], [place] [address], [place], [place] [address]    Report Status PENDING  Incomplete  MRSA PCR Screening     Status: None   Collection Time: 03/06/18  1:36 AM  Result Value Ref Range Status   MRSA by PCR NEGATIVE NEGATIVE Final    Comment:        The GeneXpert MRSA Assay (FDA approved for NASAL specimens only), is one component of a comprehensive MRSA colonization surveillance program. It is not intended to diagnose MRSA infection nor to guide or monitor treatment for MRSA infections. Performed at [facility], [place] [address], [place], [place] [address]   Respiratory Panel by PCR     Status: None   Collection Time: 03/06/18  1:58 AM  Result Value Ref Range Status   Adenovirus NOT DETECTED NOT DETECTED Final   Coronavirus 229E NOT DETECTED NOT DETECTED Final   Coronavirus HKU1 NOT DETECTED NOT DETECTED Final   Coronavirus NL63 NOT DETECTED NOT DETECTED Final   Coronavirus OC43 NOT DETECTED NOT DETECTED Final   Metapneumovirus NOT DETECTED NOT DETECTED Final    Rhinovirus / Enterovirus NOT DETECTED NOT DETECTED Final   Influenza A NOT DETECTED NOT DETECTED Final   Influenza B NOT DETECTED NOT DETECTED Final   Parainfluenza Virus 1 NOT DETECTED NOT DETECTED Final   Parainfluenza Virus 2 NOT DETECTED NOT DETECTED Final   Parainfluenza Virus 3 NOT DETECTED NOT DETECTED Final   Parainfluenza Virus 4 NOT DETECTED NOT DETECTED Final   Respiratory Syncytial Virus NOT DETECTED NOT DETECTED Final   Bordetella pertussis NOT DETECTED NOT DETECTED Final   Chlamydophila pneumoniae NOT DETECTED NOT DETECTED Final   Mycoplasma pneumoniae NOT DETECTED NOT DETECTED Final    Comment: Performed at [facility], [place] [address] [place],  [address]    RADIOLOGY STUDIES/RESULTS: Dg Chest 2 View  Result Date: 03/05/2018 CLINICAL DATA:  Short of breath.  Recent pneumonia EXAM: CHEST - 2 VIEW COMPARISON:  None. FINDINGS: Sternotomy wires overlie normal cardiac silhouette. Normal pulmonary vasculature. No effusion, infiltrate, or pneumothorax. No acute osseous abnormality. IMPRESSION: No acute cardiopulmonary process. Electronically Signed   By: Suzy Bouchard M.D.   On: 03/05/2018 15:11   Dg Chest 2 View  Result Date: 02/14/2018 CLINICAL DATA:  COPD is aspiration.  Follow-up pneumonia. EXAM: CHEST - 2 VIEW COMPARISON:  01/15/2018 FINDINGS: Borderline cardiomegaly. Normal vascularity. Bilateral central and basilar airspace opacities have improved. Some disease persists. Postoperative changes and sternotomy are noted. No pneumothorax. No pleural effusion. IMPRESSION: Improving bilateral airspace opacities. Electronically Signed  By: Marybelle Killings M.D.   On: 02/14/2018 11:02   Ct Bone Marrow Biopsy & Aspiration  Result Date: 03/07/2018 CLINICAL DATA:  Thrombocytopenia EXAM: CT GUIDED DEEP ILIAC BONE ASPIRATION AND CORE BIOPSY TECHNIQUE: Patient was placed prone on the CT gantry and limited axial scans through the pelvis were obtained. Appropriate skin entry site  was identified. Skin site was marked, prepped with chlorhexidine, draped in usual sterile fashion, and infiltrated locally with 1% lidocaine. Intravenous Fentanyl and Versed were administered as conscious sedation during continuous monitoring of the patient's level of consciousness and physiological / cardiorespiratory status by the radiology RN, with a total moderate sedation time of 10 minutes. Under CT fluoroscopic guidance an 11-gauge Cook trocar bone needle was advanced into the right iliac bone just lateral to the sacroiliac joint. Once needle tip position was confirmed, core and aspiration samples were obtained, submitted to pathology for approval. Post procedure scans show no hematoma or fracture. Patient tolerated procedure well. COMPLICATIONS: COMPLICATIONS none IMPRESSION: 1. Technically successful CT guided right iliac bone core and aspiration biopsy. Electronically Signed   By: Lucrezia Europe M.D.   On: 03/07/2018 15:25   Ct Angio Chest/abd/pel For Dissection W And/or W/wo  Addendum Date: 03/05/2018   ADDENDUM REPORT: 03/05/2018 20:17 ADDENDUM: Correction: There are several omissions in the body of the chest CT report due to recent change from table mike to speech microphone. Under cardiovascular, it should state NO large central pulmonary embolus is noted. And likewise NO thoracic aortic aneurysm is identified. Under lungs/pleura common should state: As stated above, there is pulmonary consolidation in the superior segment of the left lower lobe abutting the pleura. A small right effusion and atelectasis is noted. Centrilobular emphysema is noted, upper lobe predominant. Findings discussed with Wyn Quaker. Electronically Signed   By: Ashley Royalty M.D.   On: 03/05/2018 20:17   Result Date: 03/05/2018 CLINICAL DATA:  Dyspnea on exertion. Recently diagnosed 12 5 discharge 45 EXAM: CT ANGIOGRAPHY CHEST, ABDOMEN AND PELVIS TECHNIQUE: Multidetector CT imaging through the chest, abdomen and pelvis was  performed using the standard protocol during bolus administration of intravenous contrast. Multiplanar reconstructed images and MIPs were obtained and reviewed to evaluate the vascular anatomy. CONTRAST:  196m ISOVUE-370 IOPAMIDOL (ISOVUE-370) INJECTION 76% COMPARISON:  None. FINDINGS: CTA CHEST FINDINGS Cardiovascular: The unenhanced images through the chest demonstrate. Atherosclerosis of the thoracic aorta branch vessels are. Native main three-vessel coronary arteriosclerosis is noted with post CABG change present heart size is top normal effusion After IV contrast administration thoracic aortic aneurysm is identified. Linear streak artifacts are noted within the common carotid and subclavian veins emanating hyperdense contrast the adjacent left subclavian conclusive evidence dissection. Large central embolus is noted though the study is not tailored toward assessment emboli Mediastinum/Nodes: Normal sized thyroid gland without dominant mass. Mildly enlarged left tracheobronchial lymph nodes measuring up to 12 mm short axis larger 2 cm short axis left hilar soft tissue density/lymph nodes noted. These may be reactive given what appear to pulmonary consolidations in the superior segment of the left lower lobe compatible with pneumonia and/or atelectasis. Patent trachea and mainstem bronchi. The CT appearance of the esophagus is unremarkable. Lungs/Pleura: As stated above, there is pulmonary consolidation in superior segment of the lower lobe abutting the small right effusion and atelectasis. Centrilobular emphysema is, upper lobe. No pneumothorax. Musculoskeletal: No chest wall abnormality. No acute or significant osseous findings. Mild degenerative change along the thoracic spine. Median sternotomy sutures are in place. Review of the  MIP images confirms the above findings. CTA ABDOMEN AND PELVIS FINDINGS VASCULAR Aorta: 4.5 cm infrarenal abdominal aortic aneurysm crescentic soft plaque along the posterior and  left lateral wall series 8/92. Dissection. Celiac: Minimal atherosclerosis at the origin celiac axis patent branch vessels with atherosclerosis along the splenic artery dissection, aneurysm or significant stenosis. SMA: Patent with atherosclerotic origins. Occlusion or dissection aneurysm Renals: Single bilateral renal arteries atherosclerosis but fibromuscular dysplasia, stenosis, aneurysm or dissection identified. IMA: Patent Inflow: Patent without evidence of aneurysm, dissection, vasculitis or significant stenosis. Atherosclerosis common iliac arteries probable chronic calcified dissection involving the proximal left common iliac artery, series 8/233. Veins: No obvious venous abnormality within the limitations of this arterial phase study. Review of the MIP images confirms the above findings. NON-VASCULAR Hepatobiliary: No focal liver abnormality is seen. No gallstones, gallbladder wall thickening, or biliary dilatation. Pancreas: Mild ectasia of the pancreatic duct. No inflammation or dominant enhancing mass. Spleen: Normal Adrenals/Urinary Tract: Normal bilateral adrenal glands. Bilateral renal cysts without obstructive uropathy. No solid enhancing masses are noted. The dependent layering bladder calculi are identified. Diverticulosis of the bladder is also noted at the dome of the bladder. Stomach/Bowel: Decompressed stomach with normal duodenal sweep and ligament of Treitz position. No acute bowel obstruction. Extensive colonic diverticulosis is noted along the descending sigmoid colon. Suggestion mild pericolonic inflammation along the mid sigmoid raises the possibility of a subtle sigmoid diverticulitis, series 8/250. Lymphatic: No chest, abdomen or pelvic adenopathy. Reproductive: Mildly enlarged prostate with peripheral zone calcifications. Other: No free air or free fluid. Musculoskeletal: Lumbar spondylosis with multilevel degenerative disc and endplate changes. Schmorl's node involving the superior  endplate of L3. Review of the MIP images confirms the above findings. IMPRESSION: 1. 4.5 cm infrarenal abdominal aortic aneurysm with crescentic soft plaque along the posterior and left lateral wall. Patent branch vessels with atherosclerosis 2. No thoracic aortic aneurysm or dissection. No acute pulmonary embolus. 3. Left lower lobe pneumonia with with mediastinal and left hilar reactive adenopathy. Follow-up to assure resolution is suggested. 4. Bilateral renal cysts. 5. Descending and sigmoid colonic diverticulosis with subtle pericolonic fatty induration along the mid sigmoid raises the possibility of a mild uncomplicated sigmoid diverticulitis. Electronically Signed: By: Ashley Royalty M.D. On: 03/05/2018 19:57   [place] Ekg Site Rite  Result Date: 03/08/2018 If Site Rite image not attached, placement could not be confirmed due to current cardiac rhythm.    LOS: 3 days   Oren Binet, MD  [facility]  If 7PM-7AM, please contact night-coverage  Please page via [email or web address] [email or web address]-click on MD name and type text message  03/08/2018, 2:24 PM

## 2018-03-08 NOTE — Progress Notes (Addendum)
PT Cancellation Note  Patient Details Name: Joseph Berg MRN: [id number] DOB: Jan 28, 1934   Cancelled Treatment:    Reason Eval/Treat Not Completed: Medical issues which prohibited therapy. Platelet count continues to trend down. Per department protocol, consider no therapy if count <10. Will check in with RN and follow-up for mobility as appropriate.  Mabeline Caras, PT, DPT Acute Rehabilitation Services  Pager [phone] Office [place] 03/08/2018, 8:51 AM

## 2018-03-08 NOTE — Progress Notes (Signed)
Pt placed on CPAP with original setting of 4cmH2O per pt's home setting per wife.  EPAP increased to 6cmH2O due to pt's low SpO2 despite 4L of oxygen added. Pt tolerating well and resting comfortable.

## 2018-03-08 NOTE — Progress Notes (Addendum)
IP PROGRESS NOTE  Subjective:   Joseph Berg underwent a bone marrow biopsy yesterday.  He reports soreness at the bone marrow site.  No bleeding.  Objective: Vital signs in last 24 hours: Blood pressure 140/76, pulse 96, temperature 97.9 F (36.6 C), temperature source Oral, resp. rate (!) 25, height 5' 9"  (1.753 m), weight 210 lb (95.3 kg), SpO2 92 %.  Intake/Output from previous day: 01/27 0701 - 01/28 0700 In: 240 [P.O.:240] Out: -   Physical Exam:  HEENT: Oral cavity without bleeding or thrush  Abdomen: No hepatosplenomegaly Extremities: No leg edema Skin: Multiple ecchymoses over the arms and hands, oozing at the right forearm IV site small ecchymosis at the right iliac bone marrow site    Lab Results: Recent Labs    03/05/18 1636 03/05/18 2221 03/06/18 0341  WBC 7.5  --  7.9  HGB 12.2*  --  11.6*  HCT 37.2*  --  36.1*  PLT 8* 8* 7*    BMET Recent Labs    03/05/18 1636 03/05/18 1752 03/06/18 0341  NA 141  --  143  K 2.8*  --  3.2*  CL 104  --  102  CO2 24  --  26  GLUCOSE 106*  --  113*  BUN 14  --  15  CREATININE 1.45* 1.40* 1.58*  CALCIUM 9.7  --  10.5*  Creatinine 1.3, bilirubin 3.0, platelets 6000 Studies/Results: Dg Chest 2 View  Result Date: 03/05/2018 CLINICAL DATA:  Short of breath.  Recent pneumonia EXAM: CHEST - 2 VIEW COMPARISON:  None. FINDINGS: Sternotomy wires overlie normal cardiac silhouette. Normal pulmonary vasculature. No effusion, infiltrate, or pneumothorax. No acute osseous abnormality. IMPRESSION: No acute cardiopulmonary process. Electronically Signed   By: Suzy Bouchard M.D.   On: 03/05/2018 15:11   Ct Angio Chest/abd/pel For Dissection W And/or W/wo  Addendum Date: 03/05/2018   ADDENDUM REPORT: 03/05/2018 20:17 ADDENDUM: Correction: There are several omissions in the body of the chest CT report due to recent change from table mike to speech microphone. Under cardiovascular, it should state NO large central pulmonary  embolus is noted. And likewise NO thoracic aortic aneurysm is identified. Under lungs/pleura common should state: As stated above, there is pulmonary consolidation in the superior segment of the left lower lobe abutting the pleura. A small right effusion and atelectasis is noted. Centrilobular emphysema is noted, upper lobe predominant. Findings discussed with Wyn Quaker. Electronically Signed   By: Ashley Royalty M.D.   On: 03/05/2018 20:17   Result Date: 03/05/2018 CLINICAL DATA:  Dyspnea on exertion. Recently diagnosed 12 5 discharge 53 EXAM: CT ANGIOGRAPHY CHEST, ABDOMEN AND PELVIS TECHNIQUE: Multidetector CT imaging through the chest, abdomen and pelvis was performed using the standard protocol during bolus administration of intravenous contrast. Multiplanar reconstructed images and MIPs were obtained and reviewed to evaluate the vascular anatomy. CONTRAST:  128m ISOVUE-370 IOPAMIDOL (ISOVUE-370) INJECTION 76% COMPARISON:  None. FINDINGS: CTA CHEST FINDINGS Cardiovascular: The unenhanced images through the chest demonstrate. Atherosclerosis of the thoracic aorta branch vessels are. Native main three-vessel coronary arteriosclerosis is noted with post CABG change present heart size is top normal effusion After IV contrast administration thoracic aortic aneurysm is identified. Linear streak artifacts are noted within the common carotid and subclavian veins emanating hyperdense contrast the adjacent left subclavian conclusive evidence dissection. Large central embolus is noted though the study is not tailored toward assessment emboli Mediastinum/Nodes: Normal sized thyroid gland without dominant mass. Mildly enlarged left tracheobronchial lymph nodes measuring up to  12 mm short axis larger 2 cm short axis left hilar soft tissue density/lymph nodes noted. These may be reactive given what appear to pulmonary consolidations in the superior segment of the left lower lobe compatible with pneumonia and/or  atelectasis. Patent trachea and mainstem bronchi. The CT appearance of the esophagus is unremarkable. Lungs/Pleura: As stated above, there is pulmonary consolidation in superior segment of the lower lobe abutting the small right effusion and atelectasis. Centrilobular emphysema is, upper lobe. No pneumothorax. Musculoskeletal: No chest wall abnormality. No acute or significant osseous findings. Mild degenerative change along the thoracic spine. Median sternotomy sutures are in place. Review of the MIP images confirms the above findings. CTA ABDOMEN AND PELVIS FINDINGS VASCULAR Aorta: 4.5 cm infrarenal abdominal aortic aneurysm crescentic soft plaque along the posterior and left lateral wall series 8/92. Dissection. Celiac: Minimal atherosclerosis at the origin celiac axis patent branch vessels with atherosclerosis along the splenic artery dissection, aneurysm or significant stenosis. SMA: Patent with atherosclerotic origins. Occlusion or dissection aneurysm Renals: Single bilateral renal arteries atherosclerosis but fibromuscular dysplasia, stenosis, aneurysm or dissection identified. IMA: Patent Inflow: Patent without evidence of aneurysm, dissection, vasculitis or significant stenosis. Atherosclerosis common iliac arteries probable chronic calcified dissection involving the proximal left common iliac artery, series 8/233. Veins: No obvious venous abnormality within the limitations of this arterial phase study. Review of the MIP images confirms the above findings. NON-VASCULAR Hepatobiliary: No focal liver abnormality is seen. No gallstones, gallbladder wall thickening, or biliary dilatation. Pancreas: Mild ectasia of the pancreatic duct. No inflammation or dominant enhancing mass. Spleen: Normal Adrenals/Urinary Tract: Normal bilateral adrenal glands. Bilateral renal cysts without obstructive uropathy. No solid enhancing masses are noted. The dependent layering bladder calculi are identified. Diverticulosis of the  bladder is also noted at the dome of the bladder. Stomach/Bowel: Decompressed stomach with normal duodenal sweep and ligament of Treitz position. No acute bowel obstruction. Extensive colonic diverticulosis is noted along the descending sigmoid colon. Suggestion mild pericolonic inflammation along the mid sigmoid raises the possibility of a subtle sigmoid diverticulitis, series 8/250. Lymphatic: No chest, abdomen or pelvic adenopathy. Reproductive: Mildly enlarged prostate with peripheral zone calcifications. Other: No free air or free fluid. Musculoskeletal: Lumbar spondylosis with multilevel degenerative disc and endplate changes. Schmorl's node involving the superior endplate of L3. Review of the MIP images confirms the above findings. IMPRESSION: 1. 4.5 cm infrarenal abdominal aortic aneurysm with crescentic soft plaque along the posterior and left lateral wall. Patent branch vessels with atherosclerosis 2. No thoracic aortic aneurysm or dissection. No acute pulmonary embolus. 3. Left lower lobe pneumonia with with mediastinal and left hilar reactive adenopathy. Follow-up to assure resolution is suggested. 4. Bilateral renal cysts. 5. Descending and sigmoid colonic diverticulosis with subtle pericolonic fatty induration along the mid sigmoid raises the possibility of a mild uncomplicated sigmoid diverticulitis. Electronically Signed: By: Ashley Royalty M.D. On: 03/05/2018 19:57    Medications: I have reviewed the patient's current medications.  Assessment/Plan:  1.  Thrombocytopenia 2.  Left lung airspace consolidation-pneumonia? 3.  COPD 4.  History of coronary artery disease 5.  CHF 6.  BPH 7.  Admission with pneumonia December 2019 8.  Mild renal insufficiency 9.  Elevated liver enzymes 10.  Coagulopathy 11.  Mild elevation of the calcium level   Joseph Berg appears unchanged.  He has persistent severe thrombocytopenia.  No active bleeding.  I was contacted by Dr. Gari Crown late this morning.   He confirms the bone marrow is extensively involved with a malignancy,  consistent with small cell carcinoma.  I discussed the preliminary bone marrow finding with Joseph Berg and Joseph Berg early this morning.  I will discuss results with him again later today.  He appears to have extensive stage small cell lung cancer.  There is a survival benefit associated with systemic chemotherapy as opposed to supportive care.  However treatment will be complicated by the baseline severe thrombocytopenia.  Recommendations: 1.  Transfer to [place] long 6 E. oncology unit 2.  Transfuse platelets for bleeding or a count of less than 10,000, will ask for crossmatch platelets 3.  Place PICC blood draws and chemotherapy 4.  Dyspnea/COPD management per the medical service   I discussed the bone marrow findings and treatment options with Joseph Berg via telephone at approximately 1:30 PM.  LOS: 1 day   [name], MD   03/06/2018, 8:55 AM

## 2018-03-09 DIAGNOSIS — N19 Unspecified kidney failure: Secondary | ICD-10-CM

## 2018-03-09 DIAGNOSIS — C801 Malignant (primary) neoplasm, unspecified: Secondary | ICD-10-CM

## 2018-03-09 DIAGNOSIS — R748 Abnormal levels of other serum enzymes: Secondary | ICD-10-CM

## 2018-03-09 DIAGNOSIS — C3490 Malignant neoplasm of unspecified part of unspecified bronchus or lung: Secondary | ICD-10-CM

## 2018-03-09 LAB — BASIC METABOLIC PANEL
Anion gap: 9 (ref 5–15)
BUN: 23 mg/dL (ref 8–23)
CO2: 31 mmol/L (ref 22–32)
Calcium: 10.1 mg/dL (ref 8.9–10.3)
Chloride: 102 mmol/L (ref 98–111)
Creatinine, Ser: 1.34 mg/dL — ABNORMAL HIGH (ref 0.61–1.24)
GFR calc Af Amer: 57 mL/min — ABNORMAL LOW (ref >60)
GFR calc non Af Amer: 49 mL/min — ABNORMAL LOW (ref >60)
GLUCOSE: 93 mg/dL (ref 70–99)
Potassium: 3.2 mmol/L — ABNORMAL LOW (ref 3.5–5.1)
Sodium: 142 mmol/L (ref 135–145)

## 2018-03-09 LAB — PREPARE PLATELET PHERESIS: Unit division: 0

## 2018-03-09 LAB — EXPECTORATED SPUTUM ASSESSMENT W GRAM STAIN, RFLX TO RESP C

## 2018-03-09 LAB — CBC WITH DIFFERENTIAL/PLATELET
Abs Immature Granulocytes: 0.4 10*3/uL — ABNORMAL HIGH (ref 0.00–0.07)
Basophils Absolute: 0 10*3/uL (ref 0.0–0.1)
Basophils Relative: 0 %
Eosinophils Absolute: 0.1 10*3/uL (ref 0.0–0.5)
Eosinophils Relative: 1 %
HEMATOCRIT: 33.2 % — AB (ref 39.0–52.0)
HEMOGLOBIN: 10.3 g/dL — AB (ref 13.0–17.0)
Immature Granulocytes: 6 %
Lymphocytes Relative: 21 %
Lymphs Abs: 1.4 10*3/uL (ref 0.7–4.0)
MCH: 30.1 pg (ref 26.0–34.0)
MCHC: 31 g/dL (ref 30.0–36.0)
MCV: 97.1 fL (ref 80.0–100.0)
MONO ABS: 0.8 10*3/uL (ref 0.1–1.0)
Monocytes Relative: 12 %
Neutro Abs: 3.8 10*3/uL (ref 1.7–7.7)
Neutrophils Relative %: 60 %
Platelets: 5 10*3/uL — CL (ref 150–400)
RBC: 3.42 MIL/uL — ABNORMAL LOW (ref 4.22–5.81)
RDW: 15.3 % (ref 11.5–15.5)
WBC: 6.4 10*3/uL (ref 4.0–10.5)
nRBC: 2 % — ABNORMAL HIGH (ref 0.0–0.2)

## 2018-03-09 LAB — BPAM PLATELET PHERESIS
Blood Product Expiration Date: 202001302359
ISSUE DATE / TIME: 202001301427
UNIT TYPE AND RH: 7300

## 2018-03-09 LAB — EXPECTORATED SPUTUM ASSESSMENT W REFEX TO RESP CULTURE

## 2018-03-09 LAB — SAMPLE TO BLOOD BANK

## 2018-03-09 LAB — URIC ACID: Uric Acid, Serum: 10 mg/dL — ABNORMAL HIGH (ref 3.7–8.6)

## 2018-03-09 MED ORDER — SODIUM CHLORIDE 0.9 % IV SOLN: 250.0000 mL | INTRAVENOUS | Status: DC | PRN

## 2018-03-09 MED ORDER — SODIUM CHLORIDE 0.9% IV SOLUTION: Freq: Once | INTRAVENOUS | Status: DC

## 2018-03-09 MED ORDER — PALONOSETRON HCL INJECTION 0.25 MG/5ML
0.2500 mg | Freq: Once | INTRAVENOUS | Status: DC
  Filled 2018-03-09: qty 5

## 2018-03-09 MED ORDER — ALLOPURINOL 100 MG PO TABS
100.0000 mg | ORAL_TABLET | Freq: Every day | ORAL | Status: DC
  Administered 2018-03-10 – 2018-03-15 (×6): 100 mg via ORAL
  Filled 2018-03-09 (×7): qty 1

## 2018-03-09 MED ORDER — SODIUM CHLORIDE 0.9 % IV SOLN
8.0000 mg | Freq: Once | INTRAVENOUS | Status: AC
  Administered 2018-03-09: 8 mg via INTRAVENOUS
  Filled 2018-03-09: qty 4

## 2018-03-09 MED ORDER — ALLOPURINOL 300 MG PO TABS: 150.0000 mg | ORAL_TABLET | Freq: Every day | ORAL | Status: DC

## 2018-03-09 MED ORDER — SODIUM CHLORIDE 0.9 % IV SOLN
40.0000 mg/m2 | Freq: Once | INTRAVENOUS | Status: AC
  Administered 2018-03-10: 90 mg via INTRAVENOUS
  Filled 2018-03-09: qty 4.5

## 2018-03-09 MED ORDER — SODIUM CHLORIDE 0.9 % IV SOLN
246.0000 mg | Freq: Once | INTRAVENOUS | Status: AC
  Administered 2018-03-09: 250 mg via INTRAVENOUS
  Filled 2018-03-09: qty 25

## 2018-03-09 MED ORDER — SODIUM CHLORIDE 0.9 % IV SOLN
10.0000 mg | Freq: Once | INTRAVENOUS | Status: AC
  Administered 2018-03-09: 10 mg via INTRAVENOUS
  Filled 2018-03-09: qty 1

## 2018-03-09 MED ORDER — SODIUM CHLORIDE 0.9 % IV SOLN
10.0000 mg | Freq: Once | INTRAVENOUS | Status: AC
  Administered 2018-03-10: 10 mg via INTRAVENOUS
  Filled 2018-03-09: qty 1

## 2018-03-09 MED ORDER — SODIUM CHLORIDE 0.9 % IV SOLN
40.0000 mg/m2 | Freq: Once | INTRAVENOUS | Status: AC
  Administered 2018-03-09: 90 mg via INTRAVENOUS
  Filled 2018-03-09: qty 4.5

## 2018-03-09 NOTE — Progress Notes (Signed)
D/c Aloxi due to increased QTC on EKG.  Will give zofran instead per Dr Benay Spice.

## 2018-03-09 NOTE — Progress Notes (Signed)
Pt. Refused the use of CPAP device this shift.  Requested patient call if he changes his  Mind.

## 2018-03-09 NOTE — Progress Notes (Signed)
Chemo calculation orders verified with Alesia Richards  RN

## 2018-03-09 NOTE — Progress Notes (Signed)
Chemo dosage and calculations verified with Jocelyn Lamer, RN.

## 2018-03-09 NOTE — Progress Notes (Signed)
[facility]  Patient Status: Active (receiving services up to time of hospitalization)  [facility] is providing the following services: RN and PT  If patient discharges after hours, please call [phone].   [name] 03/09/2018, 10:40 AM

## 2018-03-09 NOTE — Progress Notes (Signed)
Ok to treat using CMET from 03/08/18 with elevated bili =3 and plt = 5000 today.  Chemo doses reduced by 50% and will delete day 3 of etoposide per Dr Benay Spice

## 2018-03-09 NOTE — Progress Notes (Signed)
IP PROGRESS NOTE  Subjective:   Joseph Berg has no new complaint.  He was transferred to [facility] long last night.  No bleeding. Objective: Vital signs in last 24 hours: Blood pressure 140/76, pulse 96, temperature 97.9 F (36.6 C), temperature source Oral, resp. rate (!) 25, height _0  (1.753 m), weight 210 lb (95.3 kg), SpO2 92 %.  Intake/Output from previous day: 01/27 0701 - 01/28 0700 In: 240 [P.O.:240] Out: -   Physical Exam:  HEENT: Oral cavity without bleeding or thrush Lungs: Breath sounds, no respiratory distress Cardiac: Regular rhythm Abdomen: No hepatosplenomegaly, nontender Extremities: No leg edema Skin: Multiple ecchymoses over the arms, few ecchymoses and petechiae at the lower legs   Lab Results: Recent Labs    03/05/18 1636 03/05/18 2221 03/06/18 0341  WBC 7.5  --  7.9  HGB 12.2*  --  11.6*  HCT 37.2*  --  36.1*  PLT 8* 8* 7*    BMET Recent Labs    03/05/18 1636 03/05/18 1752 03/06/18 0341  NA 141  --  143  K 2.8*  --  3.2*  CL 104  --  102  CO2 24  --  26  GLUCOSE 106*  --  113*  BUN 14  --  15  CREATININE 1.45* 1.40* 1.58*  CALCIUM 9.7  --  10.5*  Creatinine 1.3, bilirubin 3.0, platelets 5000 Studies/Results: Dg Chest 2 View  Result Date: 03/05/2018 CLINICAL DATA:  Short of breath.  Recent pneumonia EXAM: CHEST - 2 VIEW COMPARISON:  None. FINDINGS: Sternotomy wires overlie normal cardiac silhouette. Normal pulmonary vasculature. No effusion, infiltrate, or pneumothorax. No acute osseous abnormality. IMPRESSION: No acute cardiopulmonary process. Electronically Signed   By: Suzy Bouchard M.D.   On: 03/05/2018 15:11   Ct Angio Chest/abd/pel For Dissection W And/or W/wo  Addendum Date: 03/05/2018   ADDENDUM REPORT: 03/05/2018 20:17 ADDENDUM: Correction: There are several omissions in the body of the chest CT report due to recent change from table mike to speech microphone. Under cardiovascular, it should state NO large central pulmonary  embolus is noted. And likewise NO thoracic aortic aneurysm is identified. Under lungs/pleura common should state: As stated above, there is pulmonary consolidation in the superior segment of the left lower lobe abutting the pleura. A small right effusion and atelectasis is noted. Centrilobular emphysema is noted, upper lobe predominant. Findings discussed with Wyn Quaker. Electronically Signed   By: Ashley Royalty M.D.   On: 03/05/2018 20:17   Result Date: 03/05/2018 CLINICAL DATA:  Dyspnea on exertion. Recently diagnosed 12 5 discharge 3 EXAM: CT ANGIOGRAPHY CHEST, ABDOMEN AND PELVIS TECHNIQUE: Multidetector CT imaging through the chest, abdomen and pelvis was performed using the standard protocol during bolus administration of intravenous contrast. Multiplanar reconstructed images and MIPs were obtained and reviewed to evaluate the vascular anatomy. CONTRAST:  163m ISOVUE-370 IOPAMIDOL (ISOVUE-370) INJECTION 76% COMPARISON:  None. FINDINGS: CTA CHEST FINDINGS Cardiovascular: The unenhanced images through the chest demonstrate. Atherosclerosis of the thoracic aorta branch vessels are. Native main three-vessel coronary arteriosclerosis is noted with post CABG change present heart size is top normal effusion After IV contrast administration thoracic aortic aneurysm is identified. Linear streak artifacts are noted within the common carotid and subclavian veins emanating hyperdense contrast the adjacent left subclavian conclusive evidence dissection. Large central embolus is noted though the study is not tailored toward assessment emboli Mediastinum/Nodes: Normal sized thyroid gland without dominant mass. Mildly enlarged left tracheobronchial lymph nodes measuring up to 12 mm short axis larger  2 cm short axis left hilar soft tissue density/lymph nodes noted. These may be reactive given what appear to pulmonary consolidations in the superior segment of the left lower lobe compatible with pneumonia and/or  atelectasis. Patent trachea and mainstem bronchi. The CT appearance of the esophagus is unremarkable. Lungs/Pleura: As stated above, there is pulmonary consolidation in superior segment of the lower lobe abutting the small right effusion and atelectasis. Centrilobular emphysema is, upper lobe. No pneumothorax. Musculoskeletal: No chest wall abnormality. No acute or significant osseous findings. Mild degenerative change along the thoracic spine. Median sternotomy sutures are in place. Review of the MIP images confirms the above findings. CTA ABDOMEN AND PELVIS FINDINGS VASCULAR Aorta: 4.5 cm infrarenal abdominal aortic aneurysm crescentic soft plaque along the posterior and left lateral wall series 8/92. Dissection. Celiac: Minimal atherosclerosis at the origin celiac axis patent branch vessels with atherosclerosis along the splenic artery dissection, aneurysm or significant stenosis. SMA: Patent with atherosclerotic origins. Occlusion or dissection aneurysm Renals: Single bilateral renal arteries atherosclerosis but fibromuscular dysplasia, stenosis, aneurysm or dissection identified. IMA: Patent Inflow: Patent without evidence of aneurysm, dissection, vasculitis or significant stenosis. Atherosclerosis common iliac arteries probable chronic calcified dissection involving the proximal left common iliac artery, series 8/233. Veins: No obvious venous abnormality within the limitations of this arterial phase study. Review of the MIP images confirms the above findings. NON-VASCULAR Hepatobiliary: No focal liver abnormality is seen. No gallstones, gallbladder wall thickening, or biliary dilatation. Pancreas: Mild ectasia of the pancreatic duct. No inflammation or dominant enhancing mass. Spleen: Normal Adrenals/Urinary Tract: Normal bilateral adrenal glands. Bilateral renal cysts without obstructive uropathy. No solid enhancing masses are noted. The dependent layering bladder calculi are identified. Diverticulosis of the  bladder is also noted at the dome of the bladder. Stomach/Bowel: Decompressed stomach with normal duodenal sweep and ligament of Treitz position. No acute bowel obstruction. Extensive colonic diverticulosis is noted along the descending sigmoid colon. Suggestion mild pericolonic inflammation along the mid sigmoid raises the possibility of a subtle sigmoid diverticulitis, series 8/250. Lymphatic: No chest, abdomen or pelvic adenopathy. Reproductive: Mildly enlarged prostate with peripheral zone calcifications. Other: No free air or free fluid. Musculoskeletal: Lumbar spondylosis with multilevel degenerative disc and endplate changes. Schmorl's node involving the superior endplate of L3. Review of the MIP images confirms the above findings. IMPRESSION: 1. 4.5 cm infrarenal abdominal aortic aneurysm with crescentic soft plaque along the posterior and left lateral wall. Patent branch vessels with atherosclerosis 2. No thoracic aortic aneurysm or dissection. No acute pulmonary embolus. 3. Left lower lobe pneumonia with with mediastinal and left hilar reactive adenopathy. Follow-up to assure resolution is suggested. 4. Bilateral renal cysts. 5. Descending and sigmoid colonic diverticulosis with subtle pericolonic fatty induration along the mid sigmoid raises the possibility of a mild uncomplicated sigmoid diverticulitis. Electronically Signed: By: Ashley Royalty M.D. On: 03/05/2018 19:57    Medications: I have reviewed the patient's current medications.  Assessment/Plan:  1.    Extensive stage small cell lung cancer  Bone marrow biopsy 03/07/2018-extensive involvement of the bone marrow with metastatic small cell carcinoma consistent with a lung primary, small foci of non-small cell differentiation 2.  Thrombocytopenia secondary to #1 3.  COPD 4.  History of coronary artery disease 5.  CHF 6.  BPH 7.  Admission with pneumonia December 2019 8.  Mild renal insufficiency 9.  Elevated liver enzymes 10.   Coagulopathy 11.  Mild elevation of the calcium level   Joseph Berg is been diagnosed with extensive stage  small cell lung cancer.  It is likely the lung "consolidation" seen on chest CT and chest lymphadenopathy are related to a primary lung cancer.  There is extensive involvement of the bone marrow with metastatic small cell carcinoma.  I discussed the diagnosis, prognosis, and treatment options with Joseph Berg and his wife.  He understands no therapy will be curative.  We discussed the expected survival of weeks in the absence of systemic therapy.  He understands it is possible he could live many months if the tumor response to systemic therapy.  However he is at increased risk for toxicity from chemotherapy secondary to his poor performance status, multiple comorbid conditions, hepatic/liver insufficiency, and severe baseline thrombocytopenia.  We discussed the potential for bleeding, infection, and death following chemotherapy.  He indicated he will would like to proceed with a trial of chemotherapy.  I recommend etoposide and carboplatin with dose reductions.  We reviewed potential toxicities associated with this regimen including the chance for mucositis, alopecia, and allergic reaction, and hematologic toxicity.  He understands he will likely need platelet transfusion support 4 weeks following chemotherapy.  He has not responded appropriately to platelet transfusions over the past several days.  We will request crossmatch platelets from the blood bank.  Recommendations: 1.  Proceed with dose reduced chemotherapy today. 2.  Transfuse platelets for bleeding, plan to transfuse platelets for a count of less than 5000 when crossmatch platelets are available 3.  Increase ambulation as tolerated 4.  Dyspnea/COPD management per the medical service      LOS: 1 day   [name], MD   03/06/2018, 8:55 AM

## 2018-03-09 NOTE — Progress Notes (Signed)
CRITICAL VALUE ALERT  Critical Value:  platelets are 5  Date & Time Notied: 03/09/2018 0940  Provider Notified: Dr Benay Spice  Orders Received/Actions taken: Dr Benay Spice till wants to chemo t be given

## 2018-03-09 NOTE — Progress Notes (Signed)
PT Cancellation Note  Patient Details Name: Joseph Berg MRN: [id number] DOB: 05-15-1933   Cancelled Treatment:    Reason Eval/Treat Not Completed: Medical issues which prohibited therapy; Platelets remain low (currently 5K/uL), pt has been cancelled multiple times d/t medical reasons--(this is the 3rd consecutive cancel since PT evaluation on 03/06/18) <10K/ul platelets is a  contraindication to therapy/mobility/exercise; will check on pt again next week as schedule allows.  Pt may benefit from Palliative Care consult given his life limiting illnesses.    [facility] 03/09/2018, 9:40 AM

## 2018-03-10 LAB — PLATELET COUNT: Platelets: 16 10*3/uL — CL (ref 150–400)

## 2018-03-10 LAB — CBC WITH DIFFERENTIAL/PLATELET
Abs Immature Granulocytes: 0.47 10*3/uL — ABNORMAL HIGH (ref 0.00–0.07)
BASOS ABS: 0 10*3/uL (ref 0.0–0.1)
Basophils Relative: 0 %
Eosinophils Absolute: 0.1 10*3/uL (ref 0.0–0.5)
Eosinophils Relative: 1 %
HCT: 35 % — ABNORMAL LOW (ref 39.0–52.0)
Hemoglobin: 10.7 g/dL — ABNORMAL LOW (ref 13.0–17.0)
Immature Granulocytes: 6 %
Lymphocytes Relative: 20 %
Lymphs Abs: 1.6 10*3/uL (ref 0.7–4.0)
MCH: 29.6 pg (ref 26.0–34.0)
MCHC: 30.6 g/dL (ref 30.0–36.0)
MCV: 96.7 fL (ref 80.0–100.0)
Monocytes Absolute: 0.4 10*3/uL (ref 0.1–1.0)
Monocytes Relative: 5 %
NEUTROS ABS: 5.5 10*3/uL (ref 1.7–7.7)
Neutrophils Relative %: 68 %
PLATELETS: 5 10*3/uL — AB (ref 150–400)
RBC: 3.62 MIL/uL — ABNORMAL LOW (ref 4.22–5.81)
RDW: 15.2 % (ref 11.5–15.5)
WBC: 8.1 10*3/uL (ref 4.0–10.5)
nRBC: 2 % — ABNORMAL HIGH (ref 0.0–0.2)

## 2018-03-10 LAB — COMPREHENSIVE METABOLIC PANEL
ALT: 109 U/L — ABNORMAL HIGH (ref 0–44)
AST: 274 U/L — ABNORMAL HIGH (ref 15–41)
Albumin: 3.1 g/dL — ABNORMAL LOW (ref 3.5–5.0)
Alkaline Phosphatase: 172 U/L — ABNORMAL HIGH (ref 38–126)
Anion gap: 11 (ref 5–15)
BUN: 36 mg/dL — ABNORMAL HIGH (ref 8–23)
CO2: 28 mmol/L (ref 22–32)
Calcium: 10.1 mg/dL (ref 8.9–10.3)
Chloride: 103 mmol/L (ref 98–111)
Creatinine, Ser: 1.77 mg/dL — ABNORMAL HIGH (ref 0.61–1.24)
GFR calc Af Amer: 41 mL/min — ABNORMAL LOW (ref >60)
GFR calc non Af Amer: 35 mL/min — ABNORMAL LOW (ref >60)
Glucose, Bld: 149 mg/dL — ABNORMAL HIGH (ref 70–99)
POTASSIUM: 4 mmol/L (ref 3.5–5.1)
SODIUM: 142 mmol/L (ref 135–145)
Total Bilirubin: 4 mg/dL — ABNORMAL HIGH (ref 0.3–1.2)
Total Protein: 6 g/dL — ABNORMAL LOW (ref 6.5–8.1)

## 2018-03-10 LAB — PROTIME-INR
INR: 1.33
Prothrombin Time: 16.3 seconds — ABNORMAL HIGH (ref 11.4–15.2)

## 2018-03-10 LAB — ABO/RH: ABO/RH(D): O POS

## 2018-03-10 LAB — CULTURE, BLOOD (ROUTINE X 2)
CULTURE: NO GROWTH
Culture: NO GROWTH

## 2018-03-10 LAB — APTT: aPTT: 37 seconds — ABNORMAL HIGH (ref 24–36)

## 2018-03-10 MED ORDER — SODIUM CHLORIDE 0.9 % IV SOLN
250.0000 mL | INTRAVENOUS | Status: DC
  Administered 2018-03-10: 1000 mL via INTRAVENOUS
  Administered 2018-03-10: 250 mL via INTRAVENOUS
  Administered 2018-03-11: 06:00:00 via INTRAVENOUS

## 2018-03-10 MED ORDER — SODIUM CHLORIDE 0.9 % IV SOLN
1.0000 g | INTRAVENOUS | Status: DC
  Administered 2018-03-10 – 2018-03-11 (×2): 1 g via INTRAVENOUS
  Filled 2018-03-10 (×2): qty 1
  Filled 2018-03-10: qty 10

## 2018-03-10 NOTE — Progress Notes (Addendum)
CRITICAL VALUE ALERT  Critical Value:  Plt 5  Date & Time Notied: 03/10/2018 0800 Provider Notified: Dr Benay Spice Aware Orders Received/Actions taken: no new orders

## 2018-03-10 NOTE — Progress Notes (Signed)
Chemotherapy dosage and calculations checked and reviewed with Nancy Marus RN.

## 2018-03-10 NOTE — Progress Notes (Signed)
PROGRESS NOTE  Joseph Berg FGH:[id number] DOB: [birth year] DOA: 03/05/2018 PCP: [name], MD   LOS: 5 days   Brief narrative: Patient is a84 y.o.male with history of COPD, chronic systolic heart failure, recently hospitalized from 01/11/2018-01/18/2018-for acute hypoxic respiratory failure secondary to multifocal pneumonia. He presented to the ED on 03/05/18 with fatigue and was found to haveleft lower lobe pneumonia and severe thrombocytopenia.He was evaluated by hematology, subsequently underwent bone marrow biopsy-unfortunately it appears that the patient has small cell carcinoma (most likely lung) with bone marrow involvement causing thrombocytopenia. Per hematology recommendation, patient was transferred to [facility] for chemotherapy. See below for further details.  Assessment/Plan:  Principal Problem:   PNA (pneumonia) Active Problems:   CAD (coronary artery disease) of artery bypass graft   Chronic systolic congestive heart failure (HCC)   AAA (abdominal aortic aneurysm) without rupture (HCC)   Acute respiratory failure with hypoxia (HCC)   Diverticulitis possible   Thrombocytopenia (HCC)   Small cell carcinoma (HCC)  Extensive stage small cell carcinoma of the lung:Suspect that consolidation seen in the lung on imaging studies is likely malignant with some evidence of mild postobstructive pneumonia.   On 03/09/2018, patient was started on chemotherapy with carboplatin, etoposide and dexamethasone .  Severe thrombocytopenia- secondary to bone marrow involvement from extensive stage small cell carcinoma of the lung. No overt bleeding, 2 units of platelets transfused so far.  Platelet count low at 5000 today.  1 more unit ordered per oncology for today. Continue to follow clinically.  Continue daily CBC.  Left lobar pneumonia: Consolidation seen in lung imaging which probably is mostly lung cancer but also has component of pneumonia. Patient's urine  streptococcal antigen is positive.  Continue IV ceftriaxone for now.Respiratory virus panel was negative.   COPD:No evidence of exacerbation-continue bronchodilators.  Acute on chronic respiratory failure with hypoxia -patient was using 2 L oxygen via nasal cannula at home.  Currently he is requiring, 4 to 5 L.  Wean down as tolerated.  Encourage incentive spirometry.  Chronic systolic heart failure:Although he has a history of chronic systolic heart failure-his most recent echocardiogram showed normalization of EF. Continue Coreg.  Lasix remains on hold.  CAD s/p CABG:No anginal symptoms-antiplatelets on hold due to severe thrombocytopenia.  CKD stage CVE:[id number] close to usual baseline-follow.  Minimally elevated transaminases: No major abnormalities seen on CT of the abdomen-supportive care. LFTs were slowly downtrending.  But on repeat check today, liver enzymes are further elevated.  Alk phos 172 from 162, AST 274 from 116, ALT 109 from 46.  Will monitor.  Debility/deconditioning: Suspect some amount of debility/deconditioning at baseline-but worsened due to above-noted acute issues. PT evaluation appreciated-recommendations are for home health services.  OSA: Continue CPAP nightly.  VTE Prophylaxis: No heparin or Lovenox subcu because of severe thrombocytopenia. Code Status: DNR  Family Communication: Patient wife was at bedside. Disposition Plan: Ongoing chemotherapy.  Ultimate plan is for home with home health.  Antibiotics: Antibiotics Given (last 72 hours)    Date/Time Action Medication Dose Rate   03/07/18 2152 New Bag/Given   cefTRIAXone (ROCEPHIN) 1 g in sodium chloride 0.9 % 100 mL IVPB 1 g 200 mL/hr   03/08/18 2241 New Bag/Given   cefTRIAXone (ROCEPHIN) 2 g in sodium chloride 0.9 % 100 mL IVPB 2 g 200 mL/hr   03/09/18 2149 New Bag/Given   cefTRIAXone (ROCEPHIN) 2 g in sodium chloride 0.9 % 100 mL IVPB 2 g 200 mL/hr      Continuous Infusions:  .  sodium chloride 250 mL (03/10/18 0936)  . cefTRIAXone (ROCEPHIN)  IV    . dexamethasone (DECADRON) IVPB [facility]      Scheduled Meds: . sodium chloride   Intravenous Once  . allopurinol  100 mg Oral Daily  . CARBOplatin  250 mg Intravenous Once  . carvedilol  12.5 mg Oral BID WC  . etoposide  40 mg/m2 (Treatment Plan Recorded) Intravenous Once  . fluticasone furoate-vilanterol  1 puff Inhalation Daily  . mouth rinse  15 mL Mouth Rinse BID  . multivitamin with minerals  1 tablet Oral Daily  . sodium chloride flush  3 mL Intravenous Q12H  . umeclidinium bromide  1 puff Inhalation Daily    PRN meds: albuterol, HYDROcodone-acetaminophen, ipratropium-albuterol, senna-docusate, sodium chloride flush, sodium chloride flush, traZODone   Subjective: Patient was seen and examined this morning.  Pleasant elderly Caucasian male.  Propped up in bed.  Feels weak.  Objective: Vitals:   03/10/18 0509 03/10/18 0629  BP: (!) 91/51 (!) 109/51  Pulse: 70 79  Resp: 16 16  Temp: 97.6 F (36.4 C)   SpO2: 93% 97%    Intake/Output Summary (Last 24 hours) at 03/10/2018 0940 Last data filed at 03/10/2018 0527 Gross per 24 hour  Intake 350 ml  Output 300 ml  Net 50 ml   Filed Weights   03/05/18 1610 03/08/18 1931  Weight: 95.3 kg 94.8 kg   Body mass index is 30.86 kg/m.   Physical Exam: GENERAL: Pleasant, elderly, Caucasian male.  Not in distress.  Tired HENT: No scleral pallor or icterus. Pupils equally reactive to light. Oral mucosa is moist NECK: is supple, no palpable thyroid enlargement. CHEST: Clear to auscultation bilaterally, diminished air entry in both bases.   CVS: S1 and S2 heard, no murmur. Regular rate and rhythm. No pericardial rub. ABDOMEN: Soft, non-tender, bowel sounds are present. No palpable hepato-splenomegaly. EXTREMITIES: No edema. CNS: Alert, awake, oriented to place and person SKIN: warm and dry without rashes.  Data Review: I have personally reviewed the laboratory  data and studies available.  Terrilee Croak, MD  [facility] 03/10/2018

## 2018-03-10 NOTE — Progress Notes (Signed)
PROGRESS NOTE  Joseph Berg MWN:[id number] DOB: Mar 12, 1933 DOA: 03/05/2018 PCP: [name], MD   LOS: 5 days   Brief narrative: Patient is a 85 y.o. male with history of COPD, chronic systolic heart failure, recently hospitalized from 01/11/2018-01/18/2018-for acute hypoxic respiratory failure secondary to multifocal pneumonia. He presented to the ED on 03/05/18 with fatigue and was found to have left lower lobe pneumonia and severe thrombocytopenia. He was evaluated by hematology, subsequently underwent bone marrow biopsy-unfortunately it appears that the patient has small cell carcinoma (most likely lung) with bone marrow involvement causing thrombocytopenia.  Per hematology recommendation, patient was transferred to [facility] for chemotherapy.  See below for further details.  Assessment/Plan:  Principal Problem:   PNA (pneumonia) Active Problems:   CAD (coronary artery disease) of artery bypass graft   Chronic systolic congestive heart failure (HCC)   AAA (abdominal aortic aneurysm) without rupture (HCC)   Acute respiratory failure with hypoxia (HCC)   Diverticulitis possible   Thrombocytopenia (HCC)   Small cell carcinoma (HCC)  Extensive stage small cell carcinoma of the lung: Suspect that consolidation seen in the lung on imaging studies is likely malignant with some evidence of mild postobstructive pneumonia.  Plan is to start today on chemotherapy with carboplatin, etoposide and dexamethasone.  Severe thrombocytopenia - secondary to bone marrow involvement from extensive stage small cell carcinoma of the lung. No overt bleeding, 2 units of platelets transfused so far.  Platelet count low at 5000 today.  1 more unit ordered per oncology.  Continue to follow clinically.  Continue daily CBC.  Left lobar pneumonia:  Consolidation seen in lung imaging which probably is mostly lung cancer but also has component of pneumonia. Patient's urine streptococcal antigen is  positive.  Continue IV ceftriaxone for now. Respiratory virus panel was negative.    COPD: No evidence of exacerbation-continue bronchodilators  Chronic systolic heart failure: Although he has a history of chronic systolic heart failure-his most recent echocardiogram showed normalization of EF. Continue Coreg.  Lasix remains on hold.  CAD s/p CABG: No anginal symptoms-antiplatelets on hold due to severe thrombocytopenia.  CKD stage III: Creatinine close to usual baseline-follow.  Minimally elevated transaminases: No major abnormalities seen on CT of the abdomen-supportive care.  LFTs are slowly downtrending.  Debility/deconditioning: Suspect some amount of debility/deconditioning at baseline-but worsened due to above-noted acute issues.  PT evaluation appreciated-recommendations are for home health services.  OSA: Continue CPAP nightly.  VTE Prophylaxis: No heparin or Lovenox subcu because of severe thrombocytopenia.   Code Status: DNR  Family Communication: Patient wife was at bedside. Disposition Plan: Ongoing chemotherapy.  Ultimate plan is for home with home health.  Antibiotics: Antibiotics Given (last 72 hours)    Date/Time Action Medication Dose Rate   03/07/18 2152 New Bag/Given   cefTRIAXone (ROCEPHIN) 1 g in sodium chloride 0.9 % 100 mL IVPB 1 g 200 mL/hr   03/08/18 2241 New Bag/Given   cefTRIAXone (ROCEPHIN) 2 g in sodium chloride 0.9 % 100 mL IVPB 2 g 200 mL/hr   03/09/18 2149 New Bag/Given   cefTRIAXone (ROCEPHIN) 2 g in sodium chloride 0.9 % 100 mL IVPB 2 g 200 mL/hr      Continuous Infusions:  . sodium chloride    . dexamethasone (DECADRON) IVPB [facility]      Scheduled Meds: . sodium chloride   Intravenous Once  . allopurinol  100 mg Oral Daily  . CARBOplatin  250 mg Intravenous Once  . carvedilol  12.5 mg Oral  BID WC  . etoposide  40 mg/m2 (Treatment Plan Recorded) Intravenous Once  . fluticasone furoate-vilanterol  1 puff Inhalation Daily  . mouth  rinse  15 mL Mouth Rinse BID  . multivitamin with minerals  1 tablet Oral Daily  . sodium chloride flush  3 mL Intravenous Q12H  . umeclidinium bromide  1 puff Inhalation Daily    PRN meds: albuterol, HYDROcodone-acetaminophen, ipratropium-albuterol, senna-docusate, sodium chloride flush, sodium chloride flush, traZODone   Subjective: Pleasant elderly Caucasian male.  Propped up in bed.  Not in distress.  Wife at bedside.  On oxygen via nasal cannula.  Objective: Vitals:   03/10/18 0509 03/10/18 0629  BP: (!) 91/51 (!) 109/51  Pulse: 70 79  Resp: 16 16  Temp: 97.6 F (36.4 C)   SpO2: 93% 97%    Intake/Output Summary (Last 24 hours) at 03/10/2018 0922 Last data filed at 03/10/2018 0527 Gross per 24 hour  Intake 550 ml  Output 300 ml  Net 250 ml   Filed Weights   03/05/18 1610 03/08/18 1931  Weight: 95.3 kg 94.8 kg   Body mass index is 30.86 kg/m.   Physical Exam: GENERAL: Pleasant elderly Caucasian male, not in distress HENT: No scleral pallor or icterus. Pupils equally reactive to light. Oral mucosa is moist NECK: is supple, no palpable thyroid enlargement. CHEST: Clear to auscultation. No crackles or wheezes. Non tender on palpation. Diminished breath sounds bilaterally. CVS: S1 and S2 heard, no murmur. Regular rate and rhythm. No pericardial rub. ABDOMEN: Soft, non-tender, bowel sounds are present. No palpable hepato-splenomegaly. EXTREMITIES: No edema. CNS: Alert, awake, oriented to place and person SKIN: warm and dry without rashes.  Data Review: I have personally reviewed the laboratory data and studies available.  Terrilee Croak, MD  [facility] 03/10/2018

## 2018-03-10 NOTE — Progress Notes (Addendum)
IP PROGRESS NOTE  Subjective:   Joseph Berg completed day 1 etoposide and carboplatin yesterday.  He tolerated the chemotherapy without acute toxicity.  No bleeding.  No new complaint.  His wife is at the bedside.  She reports he is urinating in the bed. Objective: Vital signs in last 24 hours: Blood pressure 140/76, pulse 96, temperature 97.9 F (36.6 C), temperature source Oral, resp. rate (!) 25, height 5' 9"  (1.753 m), weight 210 lb (95.3 kg), SpO2 92 %.  Intake/Output from previous day: 01/27 0701 - 01/28 0700 In: 240 [P.O.:240] Out: -   Physical Exam:  HEENT: Oral cavity without active bleeding, no thrush.  Small ecchymosis at the lower inner lip Lungs: Rhonchi at the left anterior chest, no respiratory distress Cardiac: Regular rhythm Abdomen: No hepatosplenomegaly, tender in the right upper abdomen Extremities: No leg edema Skin: Multiple ecchymoses over the arms and legs   Lab Results: Recent Labs    03/05/18 1636 03/05/18 2221 03/06/18 0341  WBC 7.5  --  7.9  HGB 12.2*  --  11.6*  HCT 37.2*  --  36.1*  PLT 8* 8* 7*    BMET Recent Labs    03/05/18 1636 03/05/18 1752 03/06/18 0341  NA 141  --  143  K 2.8*  --  3.2*  CL 104  --  102  CO2 24  --  26  GLUCOSE 106*  --  113*  BUN 14  --  15  CREATININE 1.45* 1.40* 1.58*  CALCIUM 9.7  --  10.5*  Creatinine 1.77, calcium 10.1, AST 274, ALT 109, bilirubin 4.0, platelets 5 Studies/Results: Dg Chest 2 View  Result Date: 03/05/2018 CLINICAL DATA:  Short of breath.  Recent pneumonia EXAM: CHEST - 2 VIEW COMPARISON:  None. FINDINGS: Sternotomy wires overlie normal cardiac silhouette. Normal pulmonary vasculature. No effusion, infiltrate, or pneumothorax. No acute osseous abnormality. IMPRESSION: No acute cardiopulmonary process. Electronically Signed   By: Suzy Bouchard M.D.   On: 03/05/2018 15:11   Ct Angio Chest/abd/pel For Dissection W And/or W/wo  Addendum Date: 03/05/2018   ADDENDUM REPORT: 03/05/2018  20:17 ADDENDUM: Correction: There are several omissions in the body of the chest CT report due to recent change from table mike to speech microphone. Under cardiovascular, it should state NO large central pulmonary embolus is noted. And likewise NO thoracic aortic aneurysm is identified. Under lungs/pleura common should state: As stated above, there is pulmonary consolidation in the superior segment of the left lower lobe abutting the pleura. A small right effusion and atelectasis is noted. Centrilobular emphysema is noted, upper lobe predominant. Findings discussed with Wyn Quaker. Electronically Signed   By: Ashley Royalty M.D.   On: 03/05/2018 20:17   Result Date: 03/05/2018 CLINICAL DATA:  Dyspnea on exertion. Recently diagnosed 12 5 discharge 7 EXAM: CT ANGIOGRAPHY CHEST, ABDOMEN AND PELVIS TECHNIQUE: Multidetector CT imaging through the chest, abdomen and pelvis was performed using the standard protocol during bolus administration of intravenous contrast. Multiplanar reconstructed images and MIPs were obtained and reviewed to evaluate the vascular anatomy. CONTRAST:  176m ISOVUE-370 IOPAMIDOL (ISOVUE-370) INJECTION 76% COMPARISON:  None. FINDINGS: CTA CHEST FINDINGS Cardiovascular: The unenhanced images through the chest demonstrate. Atherosclerosis of the thoracic aorta branch vessels are. Native main three-vessel coronary arteriosclerosis is noted with post CABG change present heart size is top normal effusion After IV contrast administration thoracic aortic aneurysm is identified. Linear streak artifacts are noted within the common carotid and subclavian veins emanating hyperdense contrast the adjacent left  subclavian conclusive evidence dissection. Large central embolus is noted though the study is not tailored toward assessment emboli Mediastinum/Nodes: Normal sized thyroid gland without dominant mass. Mildly enlarged left tracheobronchial lymph nodes measuring up to 12 mm short axis larger 2 cm  short axis left hilar soft tissue density/lymph nodes noted. These may be reactive given what appear to pulmonary consolidations in the superior segment of the left lower lobe compatible with pneumonia and/or atelectasis. Patent trachea and mainstem bronchi. The CT appearance of the esophagus is unremarkable. Lungs/Pleura: As stated above, there is pulmonary consolidation in superior segment of the lower lobe abutting the small right effusion and atelectasis. Centrilobular emphysema is, upper lobe. No pneumothorax. Musculoskeletal: No chest wall abnormality. No acute or significant osseous findings. Mild degenerative change along the thoracic spine. Median sternotomy sutures are in place. Review of the MIP images confirms the above findings. CTA ABDOMEN AND PELVIS FINDINGS VASCULAR Aorta: 4.5 cm infrarenal abdominal aortic aneurysm crescentic soft plaque along the posterior and left lateral wall series 8/92. Dissection. Celiac: Minimal atherosclerosis at the origin celiac axis patent branch vessels with atherosclerosis along the splenic artery dissection, aneurysm or significant stenosis. SMA: Patent with atherosclerotic origins. Occlusion or dissection aneurysm Renals: Single bilateral renal arteries atherosclerosis but fibromuscular dysplasia, stenosis, aneurysm or dissection identified. IMA: Patent Inflow: Patent without evidence of aneurysm, dissection, vasculitis or significant stenosis. Atherosclerosis common iliac arteries probable chronic calcified dissection involving the proximal left common iliac artery, series 8/233. Veins: No obvious venous abnormality within the limitations of this arterial phase study. Review of the MIP images confirms the above findings. NON-VASCULAR Hepatobiliary: No focal liver abnormality is seen. No gallstones, gallbladder wall thickening, or biliary dilatation. Pancreas: Mild ectasia of the pancreatic duct. No inflammation or dominant enhancing mass. Spleen: Normal  Adrenals/Urinary Tract: Normal bilateral adrenal glands. Bilateral renal cysts without obstructive uropathy. No solid enhancing masses are noted. The dependent layering bladder calculi are identified. Diverticulosis of the bladder is also noted at the dome of the bladder. Stomach/Bowel: Decompressed stomach with normal duodenal sweep and ligament of Treitz position. No acute bowel obstruction. Extensive colonic diverticulosis is noted along the descending sigmoid colon. Suggestion mild pericolonic inflammation along the mid sigmoid raises the possibility of a subtle sigmoid diverticulitis, series 8/250. Lymphatic: No chest, abdomen or pelvic adenopathy. Reproductive: Mildly enlarged prostate with peripheral zone calcifications. Other: No free air or free fluid. Musculoskeletal: Lumbar spondylosis with multilevel degenerative disc and endplate changes. Schmorl's node involving the superior endplate of L3. Review of the MIP images confirms the above findings. IMPRESSION: 1. 4.5 cm infrarenal abdominal aortic aneurysm with crescentic soft plaque along the posterior and left lateral wall. Patent branch vessels with atherosclerosis 2. No thoracic aortic aneurysm or dissection. No acute pulmonary embolus. 3. Left lower lobe pneumonia with with mediastinal and left hilar reactive adenopathy. Follow-up to assure resolution is suggested. 4. Bilateral renal cysts. 5. Descending and sigmoid colonic diverticulosis with subtle pericolonic fatty induration along the mid sigmoid raises the possibility of a mild uncomplicated sigmoid diverticulitis. Electronically Signed: By: Ashley Royalty M.D. On: 03/05/2018 19:57    Medications: I have reviewed the patient's current medications.  Assessment/Plan:  1.    Extensive stage small cell lung cancer  CT chest 03/05/2018-left hilar mass, small mediastinal lymph nodes, atelectasis versus consolidation versus mass in the superior segment of the left lower lobe  Bone marrow biopsy  03/07/2018-extensive involvement of the bone marrow with metastatic small cell carcinoma consistent with a lung primary, small foci  of non-small cell differentiation  Cycle 1 etoposide/carboplatin 03/09/2018 2.  Thrombocytopenia secondary to #1 3.  COPD 4.  History of coronary artery disease 5.  CHF 6.  BPH 7.  Admission with pneumonia December 2019 8.  Mild renal insufficiency 9.  Elevated liver enzymes 10.  Coagulopathy 11.  Mild elevation of the calcium level   Joseph Berg has extensive stage small cell lung cancer.  He is critically ill with severe thrombocytopenia, renal failure, and elevated liver enzymes.  I suspect the elevation of liver enzymes and bilirubin are related to small cell carcinoma involving the liver.  He completed day 1 etoposide/carboplatin yesterday.  The creatinine and liver enzymes are higher today.  The plan is to continue supportive care to them preclude platelet transfusion support.  He will receive crossmatch platelets today.  I discussed CPR and ACLS issues yesterday and again today with Joseph Berg and his wife.  He will be placed on a no CODE BLUE status.  Recommendations: 1.  Transfuse crossmatch platelets today 2.  Check posttransfusion platelet count 3.  Check PT and PTT 4.  Dyspnea/COPD management per the medical service      LOS: 1 day   [name], MD   03/06/2018, 8:55 AM

## 2018-03-10 NOTE — Progress Notes (Signed)
Nursing staff reported that patients wife stated that patient would not wear CPAP when it was offered.  Will be available if patient changes his mind.

## 2018-03-11 LAB — LACTATE DEHYDROGENASE: LDH: 519 U/L — ABNORMAL HIGH (ref 98–192)

## 2018-03-11 LAB — CBC WITH DIFFERENTIAL/PLATELET
Abs Immature Granulocytes: 0.13 10*3/uL — ABNORMAL HIGH (ref 0.00–0.07)
Basophils Absolute: 0 10*3/uL (ref 0.0–0.1)
Basophils Relative: 0 %
Eosinophils Absolute: 0 10*3/uL (ref 0.0–0.5)
Eosinophils Relative: 0 %
HEMATOCRIT: 25.3 % — AB (ref 39.0–52.0)
Hemoglobin: 7.6 g/dL — ABNORMAL LOW (ref 13.0–17.0)
Immature Granulocytes: 3 %
LYMPHS ABS: 0.7 10*3/uL (ref 0.7–4.0)
Lymphocytes Relative: 16 %
MCH: 29.8 pg (ref 26.0–34.0)
MCHC: 30 g/dL (ref 30.0–36.0)
MCV: 99.2 fL (ref 80.0–100.0)
MONOS PCT: 6 %
Monocytes Absolute: 0.3 10*3/uL (ref 0.1–1.0)
Neutro Abs: 3.3 10*3/uL (ref 1.7–7.7)
Neutrophils Relative %: 75 %
Platelets: 5 10*3/uL — CL (ref 150–400)
RBC: 2.55 MIL/uL — ABNORMAL LOW (ref 4.22–5.81)
RDW: 15.3 % (ref 11.5–15.5)
WBC: 4.4 10*3/uL (ref 4.0–10.5)
nRBC: 1.6 % — ABNORMAL HIGH (ref 0.0–0.2)

## 2018-03-11 LAB — COMPREHENSIVE METABOLIC PANEL
ALT: 85 U/L — ABNORMAL HIGH (ref 0–44)
AST: 192 U/L — ABNORMAL HIGH (ref 15–41)
Albumin: 2.2 g/dL — ABNORMAL LOW (ref 3.5–5.0)
Alkaline Phosphatase: 185 U/L — ABNORMAL HIGH (ref 38–126)
Anion gap: 6 (ref 5–15)
BUN: 42 mg/dL — ABNORMAL HIGH (ref 8–23)
CO2: 25 mmol/L (ref 22–32)
Calcium: 8 mg/dL — ABNORMAL LOW (ref 8.9–10.3)
Chloride: 114 mmol/L — ABNORMAL HIGH (ref 98–111)
Creatinine, Ser: 1.95 mg/dL — ABNORMAL HIGH (ref 0.61–1.24)
GFR calc Af Amer: 36 mL/min — ABNORMAL LOW (ref >60)
GFR, EST NON AFRICAN AMERICAN: 31 mL/min — AB (ref >60)
Glucose, Bld: 141 mg/dL — ABNORMAL HIGH (ref 70–99)
Potassium: 3.3 mmol/L — ABNORMAL LOW (ref 3.5–5.1)
Sodium: 145 mmol/L (ref 135–145)
Total Bilirubin: 2.9 mg/dL — ABNORMAL HIGH (ref 0.3–1.2)
Total Protein: 4.4 g/dL — ABNORMAL LOW (ref 6.5–8.1)

## 2018-03-11 LAB — BPAM PLATELET PHERESIS
Blood Product Expiration Date: 202002012359
ISSUE DATE / TIME: 202002011024
Unit Type and Rh: 5100

## 2018-03-11 LAB — PREPARE PLATELET PHERESIS: Unit division: 0

## 2018-03-11 LAB — URIC ACID: Uric Acid, Serum: 9.5 mg/dL — ABNORMAL HIGH (ref 3.7–8.6)

## 2018-03-11 MED ORDER — SODIUM CHLORIDE 0.9% IV SOLUTION: Freq: Once | INTRAVENOUS | Status: DC

## 2018-03-11 MED ORDER — SODIUM CHLORIDE 0.9 % IV SOLN
250.0000 mL | INTRAVENOUS | Status: DC
  Administered 2018-03-11 – 2018-03-13 (×4): 250 mL via INTRAVENOUS

## 2018-03-11 MED ORDER — POTASSIUM CHLORIDE CRYS ER 20 MEQ PO TBCR
40.0000 meq | EXTENDED_RELEASE_TABLET | Freq: Once | ORAL | Status: AC
  Administered 2018-03-11: 40 meq via ORAL
  Filled 2018-03-11: qty 2

## 2018-03-11 MED ORDER — PHYTONADIONE 5 MG PO TABS
10.0000 mg | ORAL_TABLET | Freq: Two times a day (BID) | ORAL | Status: AC
  Administered 2018-03-11 – 2018-03-12 (×3): 10 mg via ORAL
  Filled 2018-03-11 (×4): qty 2

## 2018-03-11 NOTE — Progress Notes (Signed)
Call to lab to find out status of CBC with diff, S/w Tasha,, states they are running 15 minutes behind.

## 2018-03-11 NOTE — Progress Notes (Signed)
Patient is confused, wife states that patient will not wear CPAP, he pulls it off .  Wife does not want CPAP placed at this time.

## 2018-03-11 NOTE — Progress Notes (Signed)
PROGRESS NOTE  Joseph Berg ZWC:[id number] DOB: 10-29-1933 DOA: 03/05/2018 PCP: [name], MD   LOS: 6 days   Brief narrative: Patient is a84 y.o.male with history of COPD, chronic systolic heart failure, recently hospitalized from 01/11/2018-01/18/2018-for acute hypoxic respiratory failure secondary to multifocal pneumonia. [facility]ed [facility] the [place] [facility]/27/20with fatigueand wasfound to haveleft lower lobe pneumonia and severe thrombocytopenia.He was evaluated by hematology, subsequently underwent bone marrow biopsy-unfortunately it appears that the patient has small cell carcinoma (most likely lung) with bone marrow involvement causing thrombocytopenia.Per hematology recommendation, [facility].  Patient was started on chemotherapy with carboplatin and etoposide on 03/09/2018.  Subjective: Patient was seen and examined this morning.  Feels tired.  Propped up in bed.  Not in distress. Family at bedside.  Assessment/Plan:  Principal Problem:   PNA (pneumonia) Active Problems:   CAD (coronary artery disease) of artery bypass graft   Chronic systolic congestive heart failure (HCC)   AAA (abdominal aortic aneurysm) without rupture (HCC)   Acute respiratory failure with hypoxia (HCC)   Diverticulitis possible   Thrombocytopenia (HCC)   Small cell carcinoma (HCC)  Extensive stage small cell carcinoma of the lung:Most likely the consolidation seen in the lung on imaging studieswas malignantwith some evidence ofmild postobstructive pneumonia.  On 03/09/2018, patient was started on chemotherapy with carboplatin, etoposide and dexamethasone.  Oncology following.  Severe thrombocytopenia-secondary to bone marrow involvement from extensive stage small cell carcinoma of the lung.No overt bleeding,3 units of platelets transfused so far.  Platelet improved from 5-16 yesterday after transfusion but back down to 5 today.  May  need further transfusion.   Acute on chronic anemia - hemoglobin dropped from 10.7 yesterday to 7.6 today. Probably related to malignancy and chemotherapy itself.  No active bleeding.  Noted a plan for 1 unit of PRBC transfusion.  Left lobar pneumonia:Consolidation seen in lung imaging which probably is mostly lung cancer but also has component of pneumonia. Patient's urine streptococcal antigen is positive.Continue IV ceftriaxone for now.  Will complete 7-day course. Respiratory virus panel was negative.   COPD:No evidence of exacerbation-continue bronchodilators.  Acute on chronic respiratory failure with hypoxia - patient was using 2 L oxygen via nasal cannula at home. Currently he is requiring, 4 to 5 L. Wean down as tolerated.  Encourage incentive spirometry.  Chronic systolic heart failure:Although he has a history of chronic systolic heart failure, his most recent echocardiogram showed normalization of EF.Continue Coreg. Lasix remains on hold.  CAD s/p CABG:No anginal symptoms-antiplatelets on hold due to severe thrombocytopenia.  CKD stage MPN:[id number] close to usual baseline-follow.  Elevated transaminases: No major abnormalities seen on CT of the abdomen-supportive care. LFTs piton 03/10/2018.  Seem to be downtrending now.  Continue to monitor.    Debility/deconditioning: Suspect some amount of debility/deconditioning at baseline-but worsened due to above-noted acute issues. PT evaluation appreciated-recommendations are for home health services.  OSA: Continue CPAP nightly.  Hypokalemia -potassium low at 3.3.  40 mEq oral replacement given.  Acute kidney injury -creatinine 1.4-1.5 at baseline, trending up, 1.95 today.  Continue IV hydration.  Monitor daily.  Hyperuricemia -secondary to tumor lysis. Uric acid level 9.5. On allopurinol.  VTE Prophylaxis:No heparin or Lovenox subcu because of severe thrombocytopenia. Code Status: DNR Family  Communication:Patient wife was at bedside. Disposition Plan:Ongoing chemotherapy. Ultimate plan is for home with home health.  Mobility: Encourage out of bed Diet: Regular diet DVT prophylaxis:  SCD boots.  Will avoid heparin/Lovenox because of low platelet  count Code Status:   Code Status: DNR  Family Communication:  Family present at bedside Disposition Plan:  Ongoing chemotherapy  Antibiotics: . IV ceftriaxone -1/27 -continue  Infusions: . sodium chloride 75 mL/hr at 03/11/18 1109  . cefTRIAXone (ROCEPHIN)  IV 1 g (03/11/18 1108)    Scheduled Meds: . sodium chloride   Intravenous Once  . sodium chloride   Intravenous Once  . allopurinol  100 mg Oral Daily  . CARBOplatin  250 mg Intravenous Once  . carvedilol  12.5 mg Oral BID WC  . fluticasone furoate-vilanterol  1 puff Inhalation Daily  . mouth rinse  15 mL Mouth Rinse BID  . multivitamin with minerals  1 tablet Oral Daily  . phytonadione  10 mg Oral BID  . sodium chloride flush  3 mL Intravenous Q12H  . umeclidinium bromide  1 puff Inhalation Daily    PRN meds: albuterol, HYDROcodone-acetaminophen, ipratropium-albuterol, senna-docusate, sodium chloride flush, sodium chloride flush, traZODone   Consultants:  Oncology  Procedures:  None  Objective: Vitals:   03/11/18 0500 03/11/18 1052  BP: 117/60   Pulse: 75   Resp: 16   Temp: 98 F (36.7 C)   SpO2: 94% (!) 85%    Intake/Output Summary (Last 24 hours) at 03/11/2018 1144 Last data filed at 03/11/2018 0900 Gross per 24 hour  Intake 2224 ml  Output 700 ml  Net 1524 ml   Filed Weights   03/05/18 1610 03/08/18 1931  Weight: 95.3 kg 94.8 kg   Body mass index is 30.86 kg/m.   Physical Exam: GENERAL: Pleasant elderly Caucasian male.  Not in distress.  Feels tired HENT: No scleral pallor or icterus. Pupils equally reactive to light. Oral mucosa is moist NECK: is supple, no palpable thyroid enlargement. CHEST: Clear to auscultation. No crackles or  wheezes. Non tender on palpation. Diminished breath sounds bilaterally. CVS: S1 and S2 heard, no murmur. Regular rate and rhythm. No pericardial rub. ABDOMEN: Soft, non-tender, bowel sounds are present. No palpable hepato-splenomegaly. EXTREMITIES: No edema. CNS: Alert, awake, oriented x3 SKIN: warm and dry without rashes.  Data Review: I have personally reviewed the laboratory data and studies available.  CBC Latest Ref Rng & Units 03/11/2018 03/10/2018 03/10/2018  WBC 4.0 - 10.5 K/uL 4.4 - 8.1  Hemoglobin 13.0 - 17.0 g/dL 7.6(L) - 10.7(L)  Hematocrit 39.0 - 52.0 % 25.3(L) - 35.0(L)  Platelets 150 - 400 K/uL 5(LL) 16(LL) 5(LL)   BMP Latest Ref Rng & Units 03/11/2018 03/10/2018 03/09/2018  Glucose 70 - 99 mg/dL 141(H) 149(H) 93  BUN 8 - 23 mg/dL 42(H) 36(H) 23  Creatinine 0.61 - 1.24 mg/dL 1.95(H) 1.77(H) 1.34(H)  BUN/Creat Ratio 6 - 22 (calc) - - -  Sodium 135 - 145 mmol/L 145 142 142  Potassium 3.5 - 5.1 mmol/L 3.3(L) 4.0 3.2(L)  Chloride 98 - 111 mmol/L 114(H) 103 102  CO2 22 - 32 mmol/L _0 Calcium 8.9 - 10.3 mg/dL 8.0(L) 10.1 10.1     Terrilee Croak, MD  [facility] 03/11/2018

## 2018-03-11 NOTE — Progress Notes (Signed)
IP PROGRESS NOTE  Subjective:   Mr. Micucci completed day 2 etoposide yesterday.  He is lethargic after receiving trazodone this morning.  No bleeding.  No pain.  He was out of the chair yesterday.  He is passing urine.  His wife and son are at the bedside. Objective: Vital signs in last 24 hours: Blood pressure 140/76, pulse 96, temperature 97.9 F (36.6 C), temperature source Oral, resp. rate (!) 25, height 5' 9"  (1.753 m), weight 210 lb (95.3 kg), SpO2 92 %.  Intake/Output from previous day: 01/27 0701 - 01/28 0700 In: 240 [P.O.:240] Out: -   Physical Exam:  HEENT: Oral cavity without active bleeding, no thrush.  Resolving ecchymosis at the left lower lip. Lungs: Clear anteriorly, no respiratory distress Cardiac: Regular rhythm, distant heart sounds Abdomen: No hepatosplenomegaly, tender in the right upper abdomen Extremities: No leg edema Skin: Multiple ecchymoses over the arms and legs   Lab Results: Recent Labs    03/05/18 1636 03/05/18 2221 03/06/18 0341  WBC 7.5  --  7.9  HGB 12.2*  --  11.6*  HCT 37.2*  --  36.1*  PLT 8* 8* 7*   Platelets 5000, post transfusion platelet count 03/10/1998 20-16 BMET Recent Labs    03/05/18 1636 03/05/18 1752 03/06/18 0341  NA 141  --  143  K 2.8*  --  3.2*  CL 104  --  102  CO2 24  --  26  GLUCOSE 106*  --  113*  BUN 14  --  15  CREATININE 1.45* 1.40* 1.58*  CALCIUM 9.7  --  10.5*  Creatinine 1.95, BUN 42, uric acid 9.5, AST 192, ALT 85, bilirubin 2.9 LDH 519 Studies/Results: Dg Chest 2 View  Result Date: 03/05/2018 CLINICAL DATA:  Short of breath.  Recent pneumonia EXAM: CHEST - 2 VIEW COMPARISON:  None. FINDINGS: Sternotomy wires overlie normal cardiac silhouette. Normal pulmonary vasculature. No effusion, infiltrate, or pneumothorax. No acute osseous abnormality. IMPRESSION: No acute cardiopulmonary process. Electronically Signed   By: Suzy Bouchard M.D.   On: 03/05/2018 15:11   Ct Angio Chest/abd/pel For  Dissection W And/or W/wo  Addendum Date: 03/05/2018   ADDENDUM REPORT: 03/05/2018 20:17 ADDENDUM: Correction: There are several omissions in the body of the chest CT report due to recent change from table mike to speech microphone. Under cardiovascular, it should state NO large central pulmonary embolus is noted. And likewise NO thoracic aortic aneurysm is identified. Under lungs/pleura common should state: As stated above, there is pulmonary consolidation in the superior segment of the left lower lobe abutting the pleura. A small right effusion and atelectasis is noted. Centrilobular emphysema is noted, upper lobe predominant. Findings discussed with Wyn Quaker. Electronically Signed   By: Ashley Royalty M.D.   On: 03/05/2018 20:17   Result Date: 03/05/2018 CLINICAL DATA:  Dyspnea on exertion. Recently diagnosed 12 5 discharge 70 EXAM: CT ANGIOGRAPHY CHEST, ABDOMEN AND PELVIS TECHNIQUE: Multidetector CT imaging through the chest, abdomen and pelvis was performed using the standard protocol during bolus administration of intravenous contrast. Multiplanar reconstructed images and MIPs were obtained and reviewed to evaluate the vascular anatomy. CONTRAST:  177m ISOVUE-370 IOPAMIDOL (ISOVUE-370) INJECTION 76% COMPARISON:  None. FINDINGS: CTA CHEST FINDINGS Cardiovascular: The unenhanced images through the chest demonstrate. Atherosclerosis of the thoracic aorta branch vessels are. Native main three-vessel coronary arteriosclerosis is noted with post CABG change present heart size is top normal effusion After IV contrast administration thoracic aortic aneurysm is identified. Linear streak artifacts are noted  within the common carotid and subclavian veins emanating hyperdense contrast the adjacent left subclavian conclusive evidence dissection. Large central embolus is noted though the study is not tailored toward assessment emboli Mediastinum/Nodes: Normal sized thyroid gland without dominant mass. Mildly  enlarged left tracheobronchial lymph nodes measuring up to 12 mm short axis larger 2 cm short axis left hilar soft tissue density/lymph nodes noted. These may be reactive given what appear to pulmonary consolidations in the superior segment of the left lower lobe compatible with pneumonia and/or atelectasis. Patent trachea and mainstem bronchi. The CT appearance of the esophagus is unremarkable. Lungs/Pleura: As stated above, there is pulmonary consolidation in superior segment of the lower lobe abutting the small right effusion and atelectasis. Centrilobular emphysema is, upper lobe. No pneumothorax. Musculoskeletal: No chest wall abnormality. No acute or significant osseous findings. Mild degenerative change along the thoracic spine. Median sternotomy sutures are in place. Review of the MIP images confirms the above findings. CTA ABDOMEN AND PELVIS FINDINGS VASCULAR Aorta: 4.5 cm infrarenal abdominal aortic aneurysm crescentic soft plaque along the posterior and left lateral wall series 8/92. Dissection. Celiac: Minimal atherosclerosis at the origin celiac axis patent branch vessels with atherosclerosis along the splenic artery dissection, aneurysm or significant stenosis. SMA: Patent with atherosclerotic origins. Occlusion or dissection aneurysm Renals: Single bilateral renal arteries atherosclerosis but fibromuscular dysplasia, stenosis, aneurysm or dissection identified. IMA: Patent Inflow: Patent without evidence of aneurysm, dissection, vasculitis or significant stenosis. Atherosclerosis common iliac arteries probable chronic calcified dissection involving the proximal left common iliac artery, series 8/233. Veins: No obvious venous abnormality within the limitations of this arterial phase study. Review of the MIP images confirms the above findings. NON-VASCULAR Hepatobiliary: No focal liver abnormality is seen. No gallstones, gallbladder wall thickening, or biliary dilatation. Pancreas: Mild ectasia of the  pancreatic duct. No inflammation or dominant enhancing mass. Spleen: Normal Adrenals/Urinary Tract: Normal bilateral adrenal glands. Bilateral renal cysts without obstructive uropathy. No solid enhancing masses are noted. The dependent layering bladder calculi are identified. Diverticulosis of the bladder is also noted at the dome of the bladder. Stomach/Bowel: Decompressed stomach with normal duodenal sweep and ligament of Treitz position. No acute bowel obstruction. Extensive colonic diverticulosis is noted along the descending sigmoid colon. Suggestion mild pericolonic inflammation along the mid sigmoid raises the possibility of a subtle sigmoid diverticulitis, series 8/250. Lymphatic: No chest, abdomen or pelvic adenopathy. Reproductive: Mildly enlarged prostate with peripheral zone calcifications. Other: No free air or free fluid. Musculoskeletal: Lumbar spondylosis with multilevel degenerative disc and endplate changes. Schmorl's node involving the superior endplate of L3. Review of the MIP images confirms the above findings. IMPRESSION: 1. 4.5 cm infrarenal abdominal aortic aneurysm with crescentic soft plaque along the posterior and left lateral wall. Patent branch vessels with atherosclerosis 2. No thoracic aortic aneurysm or dissection. No acute pulmonary embolus. 3. Left lower lobe pneumonia with with mediastinal and left hilar reactive adenopathy. Follow-up to assure resolution is suggested. 4. Bilateral renal cysts. 5. Descending and sigmoid colonic diverticulosis with subtle pericolonic fatty induration along the mid sigmoid raises the possibility of a mild uncomplicated sigmoid diverticulitis. Electronically Signed: By: Ashley Royalty M.D. On: 03/05/2018 19:57    Medications: I have reviewed the patient's current medications.  Assessment/Plan:  1.    Extensive stage small cell lung cancer  CT chest 03/05/2018-left hilar mass, small mediastinal lymph nodes, atelectasis versus consolidation versus  mass in the superior segment of the left lower lobe  Bone marrow biopsy 03/07/2018-extensive involvement of the bone  marrow with metastatic small cell carcinoma consistent with a lung primary, small foci of non-small cell differentiation  Cycle 1 etoposide/carboplatin 03/09/2018 2.  Thrombocytopenia secondary to #1 3.  COPD 4.  History of coronary artery disease 5.  CHF 6.  BPH 7.  Admission with pneumonia December 2019 8.  Mild renal insufficiency 9.  Elevated liver enzymes 10.  Coagulopathy 11.  Mild elevation of the calcium level   Mr. Wolters has completed cycle 1 etoposide/carboplatin.  He remains critically ill with respiratory failure, renal failure, liver dysfunction, coagulopathy, and severe anemia/thrombocytopenia.  He remains refractory to platelet transfusion support.  I will set a platelet transfusion threshold of less than 5000 or for bleeding.  We will ask for crossmatch platelets tomorrow.  The etiology of the acute drop in hemoglobin is unclear.  We will transfuse red cells if the hemoglobin falls further.  He has renal failure that has worsened over the past several days.  This may be related to tumor lysis.  He will continue allopurinol and intravenous hydration.  I discussed the diagnosis and poor prognosis with his son.  Questions were answered.   Recommendations: 1.  Request crossmatch platelets for transfusion 03/12/2018 2.  Continue intravenous hydration and allopurinol 3.  Vitamin K 4.  Dyspnea/COPD management per the medical service  5.  Out of bed as tolerated     LOS: 1 day   [name], MD   03/06/2018, 8:55 AM

## 2018-03-12 ENCOUNTER — Other Ambulatory Visit (HOSPITAL_COMMUNITY): Payer: Medicare PPO

## 2018-03-12 LAB — CBC WITH DIFFERENTIAL/PLATELET
Abs Immature Granulocytes: 0.27 10*3/uL — ABNORMAL HIGH (ref 0.00–0.07)
BASOS PCT: 0 %
Basophils Absolute: 0 10*3/uL (ref 0.0–0.1)
Eosinophils Absolute: 0 10*3/uL (ref 0.0–0.5)
Eosinophils Relative: 0 %
HCT: 29 % — ABNORMAL LOW (ref 39.0–52.0)
Hemoglobin: 9 g/dL — ABNORMAL LOW (ref 13.0–17.0)
Immature Granulocytes: 7 %
Lymphocytes Relative: 18 %
Lymphs Abs: 0.8 10*3/uL (ref 0.7–4.0)
MCH: 30.6 pg (ref 26.0–34.0)
MCHC: 31 g/dL (ref 30.0–36.0)
MCV: 98.6 fL (ref 80.0–100.0)
Monocytes Absolute: 0.1 10*3/uL (ref 0.1–1.0)
Monocytes Relative: 3 %
Neutro Abs: 3 10*3/uL (ref 1.7–7.7)
Neutrophils Relative %: 72 %
RBC: 2.94 MIL/uL — AB (ref 4.22–5.81)
RDW: 15.7 % — ABNORMAL HIGH (ref 11.5–15.5)
WBC: 4.1 10*3/uL (ref 4.0–10.5)
nRBC: 0.5 % — ABNORMAL HIGH (ref 0.0–0.2)

## 2018-03-12 LAB — COMPREHENSIVE METABOLIC PANEL
ALK PHOS: 251 U/L — AB (ref 38–126)
ALT: 89 U/L — AB (ref 0–44)
AST: 201 U/L — ABNORMAL HIGH (ref 15–41)
Albumin: 2.4 g/dL — ABNORMAL LOW (ref 3.5–5.0)
Anion gap: 7 (ref 5–15)
BUN: 58 mg/dL — ABNORMAL HIGH (ref 8–23)
CALCIUM: 9.1 mg/dL (ref 8.9–10.3)
CO2: 27 mmol/L (ref 22–32)
Chloride: 111 mmol/L (ref 98–111)
Creatinine, Ser: 2.04 mg/dL — ABNORMAL HIGH (ref 0.61–1.24)
GFR calc Af Amer: 34 mL/min — ABNORMAL LOW (ref >60)
GFR calc non Af Amer: 29 mL/min — ABNORMAL LOW (ref >60)
GLUCOSE: 113 mg/dL — AB (ref 70–99)
Potassium: 4.3 mmol/L (ref 3.5–5.1)
Sodium: 145 mmol/L (ref 135–145)
Total Bilirubin: 2.9 mg/dL — ABNORMAL HIGH (ref 0.3–1.2)
Total Protein: 5 g/dL — ABNORMAL LOW (ref 6.5–8.1)

## 2018-03-12 LAB — CULTURE, RESPIRATORY W GRAM STAIN: Culture: NORMAL

## 2018-03-12 LAB — LACTATE DEHYDROGENASE: LDH: 597 U/L — ABNORMAL HIGH (ref 98–192)

## 2018-03-12 LAB — PROTIME-INR
INR: 1.35
Prothrombin Time: 16.5 seconds — ABNORMAL HIGH (ref 11.4–15.2)

## 2018-03-12 LAB — TYPE AND SCREEN
ABO/RH(D): O POS
Antibody Screen: NEGATIVE

## 2018-03-12 LAB — APTT: aPTT: 36 seconds (ref 24–36)

## 2018-03-12 LAB — URIC ACID: Uric Acid, Serum: 9.6 mg/dL — ABNORMAL HIGH (ref 3.7–8.6)

## 2018-03-12 MED ORDER — TBO-FILGRASTIM 300 MCG/0.5ML ~~LOC~~ SOSY
300.0000 ug | PREFILLED_SYRINGE | Freq: Every day | SUBCUTANEOUS | Status: AC
  Administered 2018-03-12 – 2018-03-18 (×7): 300 ug via SUBCUTANEOUS
  Filled 2018-03-12 (×7): qty 0.5

## 2018-03-12 NOTE — Progress Notes (Signed)
PT Cancellation Note  Patient Details Name: Joseph Berg MRN: [id number] DOB: 11/26/1933   Cancelled Treatment:    Reason Eval/Treat Not Completed: Medical issues which prohibited therapy(Platelets are less than 5, PT contraindicated. Will follow. )   Philomena Doheny PT 03/12/2018  Acute Rehabilitation Services Pager [phone] Office [phone]

## 2018-03-12 NOTE — Progress Notes (Signed)
IP PROGRESS NOTE  Subjective:   Joseph Berg is alert this morning.  His son and wife are at the bedside.  He was up in the chair yesterday.  No pain or bleeding.  No new complaint. Objective: Vital signs in last 24 hours: Blood pressure 140/76, pulse 96, temperature 97.9 F (36.6 C), temperature source Oral, resp. rate (!) 25, height _0  (1.753 m), weight 210 lb (95.3 kg), SpO2 92 %.  Intake/Output from previous day: 01/27 0701 - 01/28 0700 In: 240 [P.O.:240] Out: -   Physical Exam:  HEENT: Oral cavity without active bleeding, no thrush.  Small ecchymosis at the left lower lip. Lungs: Clear anteriorly, no respiratory distress Cardiac: Regular rhythm, distant heart sounds Abdomen: No hepatosplenomegaly, nontender Extremities: No leg edema Skin: Multiple ecchymoses over the arms and legs erythema at the tip of the right first toe   Lab Results: Recent Labs    03/05/18 1636 03/05/18 2221 03/06/18 0341  WBC 7.5  --  7.9  HGB 12.2*  --  11.6*  HCT 37.2*  --  36.1*  PLT 8* 8* 7*   Platelets less than 5000, hemoglobin 9, ANC 3.0 BMET Recent Labs    03/05/18 1636 03/05/18 1752 03/06/18 0341  NA 141  --  143  K 2.8*  --  3.2*  CL 104  --  102  CO2 24  --  26  GLUCOSE 106*  --  113*  BUN 14  --  15  CREATININE 1.45* 1.40* 1.58*  CALCIUM 9.7  --  10.5*  LDH 597, bilirubin 2.9, AST 201, ALT 89, BUN 58, creatinine 2.04, uric acid 9.6 Dg Chest 2 View  Result Date: 03/05/2018 CLINICAL DATA:  Short of breath.  Recent pneumonia EXAM: CHEST - 2 VIEW COMPARISON:  None. FINDINGS: Sternotomy wires overlie normal cardiac silhouette. Normal pulmonary vasculature. No effusion, infiltrate, or pneumothorax. No acute osseous abnormality. IMPRESSION: No acute cardiopulmonary process. Electronically Signed   By: Suzy Bouchard M.D.   On: 03/05/2018 15:11   Ct Angio Chest/abd/pel For Dissection W And/or W/wo  Addendum Date: 03/05/2018   ADDENDUM REPORT: 03/05/2018 20:17 ADDENDUM:  Correction: There are several omissions in the body of the chest CT report due to recent change from table mike to speech microphone. Under cardiovascular, it should state NO large central pulmonary embolus is noted. And likewise NO thoracic aortic aneurysm is identified. Under lungs/pleura common should state: As stated above, there is pulmonary consolidation in the superior segment of the left lower lobe abutting the pleura. A small right effusion and atelectasis is noted. Centrilobular emphysema is noted, upper lobe predominant. Findings discussed with Wyn Quaker. Electronically Signed   By: Ashley Royalty M.D.   On: 03/05/2018 20:17   Result Date: 03/05/2018 CLINICAL DATA:  Dyspnea on exertion. Recently diagnosed 12 5 discharge 4 EXAM: CT ANGIOGRAPHY CHEST, ABDOMEN AND PELVIS TECHNIQUE: Multidetector CT imaging through the chest, abdomen and pelvis was performed using the standard protocol during bolus administration of intravenous contrast. Multiplanar reconstructed images and MIPs were obtained and reviewed to evaluate the vascular anatomy. CONTRAST:  125m ISOVUE-370 IOPAMIDOL (ISOVUE-370) INJECTION 76% COMPARISON:  None. FINDINGS: CTA CHEST FINDINGS Cardiovascular: The unenhanced images through the chest demonstrate. Atherosclerosis of the thoracic aorta branch vessels are. Native main three-vessel coronary arteriosclerosis is noted with post CABG change present heart size is top normal effusion After IV contrast administration thoracic aortic aneurysm is identified. Linear streak artifacts are noted within the common carotid and subclavian veins emanating hyperdense  contrast the adjacent left subclavian conclusive evidence dissection. Large central embolus is noted though the study is not tailored toward assessment emboli Mediastinum/Nodes: Normal sized thyroid gland without dominant mass. Mildly enlarged left tracheobronchial lymph nodes measuring up to 12 mm short axis larger 2 cm short axis left  hilar soft tissue density/lymph nodes noted. These may be reactive given what appear to pulmonary consolidations in the superior segment of the left lower lobe compatible with pneumonia and/or atelectasis. Patent trachea and mainstem bronchi. The CT appearance of the esophagus is unremarkable. Lungs/Pleura: As stated above, there is pulmonary consolidation in superior segment of the lower lobe abutting the small right effusion and atelectasis. Centrilobular emphysema is, upper lobe. No pneumothorax. Musculoskeletal: No chest wall abnormality. No acute or significant osseous findings. Mild degenerative change along the thoracic spine. Median sternotomy sutures are in place. Review of the MIP images confirms the above findings. CTA ABDOMEN AND PELVIS FINDINGS VASCULAR Aorta: 4.5 cm infrarenal abdominal aortic aneurysm crescentic soft plaque along the posterior and left lateral wall series 8/92. Dissection. Celiac: Minimal atherosclerosis at the origin celiac axis patent branch vessels with atherosclerosis along the splenic artery dissection, aneurysm or significant stenosis. SMA: Patent with atherosclerotic origins. Occlusion or dissection aneurysm Renals: Single bilateral renal arteries atherosclerosis but fibromuscular dysplasia, stenosis, aneurysm or dissection identified. IMA: Patent Inflow: Patent without evidence of aneurysm, dissection, vasculitis or significant stenosis. Atherosclerosis common iliac arteries probable chronic calcified dissection involving the proximal left common iliac artery, series 8/233. Veins: No obvious venous abnormality within the limitations of this arterial phase study. Review of the MIP images confirms the above findings. NON-VASCULAR Hepatobiliary: No focal liver abnormality is seen. No gallstones, gallbladder wall thickening, or biliary dilatation. Pancreas: Mild ectasia of the pancreatic duct. No inflammation or dominant enhancing mass. Spleen: Normal Adrenals/Urinary Tract:  Normal bilateral adrenal glands. Bilateral renal cysts without obstructive uropathy. No solid enhancing masses are noted. The dependent layering bladder calculi are identified. Diverticulosis of the bladder is also noted at the dome of the bladder. Stomach/Bowel: Decompressed stomach with normal duodenal sweep and ligament of Treitz position. No acute bowel obstruction. Extensive colonic diverticulosis is noted along the descending sigmoid colon. Suggestion mild pericolonic inflammation along the mid sigmoid raises the possibility of a subtle sigmoid diverticulitis, series 8/250. Lymphatic: No chest, abdomen or pelvic adenopathy. Reproductive: Mildly enlarged prostate with peripheral zone calcifications. Other: No free air or free fluid. Musculoskeletal: Lumbar spondylosis with multilevel degenerative disc and endplate changes. Schmorl's node involving the superior endplate of L3. Review of the MIP images confirms the above findings. IMPRESSION: 1. 4.5 cm infrarenal abdominal aortic aneurysm with crescentic soft plaque along the posterior and left lateral wall. Patent branch vessels with atherosclerosis 2. No thoracic aortic aneurysm or dissection. No acute pulmonary embolus. 3. Left lower lobe pneumonia with with mediastinal and left hilar reactive adenopathy. Follow-up to assure resolution is suggested. 4. Bilateral renal cysts. 5. Descending and sigmoid colonic diverticulosis with subtle pericolonic fatty induration along the mid sigmoid raises the possibility of a mild uncomplicated sigmoid diverticulitis. Electronically Signed: By: Ashley Royalty M.D. On: 03/05/2018 19:57    Medications: I have reviewed the patient's current medications.  Assessment/Plan:  1.    Extensive stage small cell lung cancer  CT chest 03/05/2018-left hilar mass, small mediastinal lymph nodes, atelectasis versus consolidation versus mass in the superior segment of the left lower lobe  Bone marrow biopsy 03/07/2018-extensive  involvement of the bone marrow with metastatic small cell carcinoma consistent with a  lung primary, small foci of non-small cell differentiation  Cycle 1 etoposide/carboplatin 03/09/2018 2.  Thrombocytopenia secondary to #1 3.  COPD 4.  History of coronary artery disease 5.  CHF 6.  BPH 7.  Admission with pneumonia December 2019 8.  Mild renal insufficiency 9.  Elevated liver enzymes 10.  Coagulopathy 11.  Mild elevation of the calcium level   Mr. Clerk appears unchanged.  He has persistent severe thrombocytopenia, renal insufficiency, and elevation of the liver enzymes.  He is at high risk for developing neutropenia following chemotherapy.  He will be started on G-CSF today.  I reviewed potential toxicities associated with G-CSF and he agrees to proceed.  The hemoglobin is improved today.  The platelets remain severely low.  He will receive a platelet transfusion today.    Recommendations: 1.  Transfuse platelets today 2.  Continue intravenous hydration and allopurinol 3.  Vitamin K 4.  Dyspnea/COPD management per the medical service  5.  Out of bed as tolerated 6.  Begin G-CSF     LOS: 1 day   [name], MD   03/06/2018, 8:55 AM

## 2018-03-12 NOTE — Progress Notes (Signed)
PROGRESS NOTE  Joseph Berg YSA:[id number] DOB: August 13, 1933 DOA: 03/05/2018 PCP: [name], MD   LOS: 7 days   Brief narrative: Patient is a84 y.o.male with history of COPD, chronic systolic heart failure, recently hospitalized from 01/11/2018-01/18/2018-for acute hypoxic respiratory failure secondary to multifocal pneumonia. Hepresented to the [place] 1/27/20with fatigueand wasfound to haveleft lower lobe pneumonia and severe thrombocytopenia.He was evaluated by hematology, subsequently underwent bone marrow biopsy-unfortunately it appears that the patient has small cell carcinoma (most likely lung) with bone marrow involvement causing thrombocytopenia.Per hematology recommendation, [facility].  Patient was started on chemotherapy with carboplatin and etoposide on 03/09/2018.  Subjective: Patient was seen and examined this morning.  Pleasant elderly Caucasian male.  Propped up in bed.  Feeling tired.  Family at bedside.  No new complaint.  Assessment/Plan:  Principal Problem:   PNA (pneumonia) Active Problems:   CAD (coronary artery disease) of artery bypass graft   Chronic systolic congestive heart failure (HCC)   AAA (abdominal aortic aneurysm) without rupture (HCC)   Acute respiratory failure with hypoxia (HCC)   Diverticulitis possible   Thrombocytopenia (HCC)   Small cell carcinoma (HCC)  Extensive stage small cell carcinoma of the lung:Most likely the consolidation seen in the lung on imaging studieswas malignantwith some evidence ofmild postobstructive pneumonia.On 03/09/2018, patient was started onchemotherapy with carboplatin, etoposide and dexamethasone.  Oncology following.  Severe thrombocytopenia-secondary to bone marrow involvement from extensive stage small cell carcinoma of the lung.No overt bleeding,3 units of platelets transfused so far.  Platelet less than 5 today.  Patient has extensive  bruising but no active bleeding.  Noted a plan to transfuse 1 unit of platelets today.    Acute on chronic anemia - hemoglobin improved from 7.6 yesterday to 9 today without any PRBC transfusion.  Monitor.  Left lobar pneumonia:Consolidation seen in lung imaging which probably is mostly lung cancer but also has component of pneumonia. Patient's urine streptococcal antigen is positive.Patient completes 7-day course of IV Rocephin today.  Respiratory virus panel was negative.   Acute disorientation- patient is partially oriented to time, not oriented to place and person.  Looks lethargic.  Not febrile.  Is disoriented likely because of diminished malignancy, chemotherapy and electrolyte abnormalities.  Continue to monitor mental status.  COPD:No evidence of exacerbation-continue bronchodilators.  Acute on chronic respiratory failure with hypoxia- patient was using 2 L oxygen via nasal cannula at home.Currently he is requiring, 4 to 5 L. Wean down as tolerated. Encourage incentive spirometry.  Chronic systolic heart failure:Although he has a history of chronic systolic heart failure, his most recent echocardiogram showed normalization of EF.Continue Coreg. Lasix remains on hold.  CAD s/p CABG:No anginal symptoms-antiplatelets on hold due to severe thrombocytopenia.  CKD stage NAT:[id number] close to usual baseline-follow.  Elevated transaminases: No major abnormalities seen on CT of the abdomen-supportive care.  LFTs continue to remain somewhat elevated.  Continue to monitor.  Debility/deconditioning: Suspect some amount of debility/deconditioning at baseline-but worsened due to above-noted acute issues. PT evaluation appreciated-recommendations are for home health services.  OSA: Continue CPAP nightly.  Hypokalemia -potassium low at 3.3. 40 mEq oral replacement given.  Acute kidney injury -creatinine 1.4-1.5 at baseline, trending up, 2.04 today.  Continue IV  hydration.  Monitor daily.  Hyperuricemia -secondary to tumor lysis. Uric acid level 9.6. On allopurinol.  Mobility: Encourage out of bed Diet: Regular diet DVT prophylaxis: SCD boots.  Will avoid heparin/Lovenox because of low platelet count Code Status:  Code Status:  DNR  Family Communication: Family present at bedside Disposition Plan: Ongoing chemotherapy  Consultants:  Oncology  Procedures:  None  Antimicrobials: Antimicrobial Start date End date  . IV Rocephin  03/05/2018  03/12/2018   Infusions: . sodium chloride 250 mL (03/12/18 0639)  . cefTRIAXone (ROCEPHIN)  IV Stopped (03/11/18 1139)    Scheduled Meds: . sodium chloride   Intravenous Once  . sodium chloride   Intravenous Once  . allopurinol  100 mg Oral Daily  . carvedilol  12.5 mg Oral BID WC  . fluticasone furoate-vilanterol  1 puff Inhalation Daily  . mouth rinse  15 mL Mouth Rinse BID  . multivitamin with minerals  1 tablet Oral Daily  . phytonadione  10 mg Oral BID  . sodium chloride flush  3 mL Intravenous Q12H  . umeclidinium bromide  1 puff Inhalation Daily    PRN meds: albuterol, HYDROcodone-acetaminophen, ipratropium-albuterol, senna-docusate, sodium chloride flush, sodium chloride flush, traZODone   Objective: Vitals:   03/12/18 0848 03/12/18 0849  BP:    Pulse:    Resp:    Temp:    SpO2: 98% 96%    Intake/Output Summary (Last 24 hours) at 03/12/2018 1018 Last data filed at 03/12/2018 0550 Gross per 24 hour  Intake 440 ml  Output 725 ml  Net -285 ml   Filed Weights   03/05/18 1610 03/08/18 1931  Weight: 95.3 kg 94.8 kg   Body mass index is 30.86 kg/m.   Physical Exam: GENERAL: Pleasant, elderly, Caucasian male.  Lethargic.  Propped up in bed HENT: No scleral pallor or icterus. Pupils equally reactive to light. Oral mucosa is moist NECK: is supple, no palpable thyroid enlargement. CHEST: Clear to auscultation. No crackles or wheezes.  Diminished air entry in both bases CVS:  S1 and S2 heard, no murmur. Regular rate and rhythm. No pericardial rub. ABDOMEN: Soft, non-tender, bowel sounds are present. No palpable hepato-splenomegaly. EXTREMITIES: No edema. CNS: Partially oriented to time, not oriented to place or person. SKIN: warm and dry without rashes.  Data Review: I have personally reviewed the laboratory data and studies available.  CBC Latest Ref Rng & Units 03/12/2018 03/11/2018 03/10/2018  WBC 4.0 - 10.5 K/uL 4.1 4.4 -  Hemoglobin 13.0 - 17.0 g/dL 9.0(L) 7.6(L) -  Hematocrit 39.0 - 52.0 % 29.0(L) 25.3(L) -  Platelets 150 - 400 K/uL <5(LL) 5(LL) 16(LL)   BMP Latest Ref Rng & Units 03/12/2018 03/11/2018 03/10/2018  Glucose 70 - 99 mg/dL 113(H) 141(H) 149(H)  BUN 8 - 23 mg/dL 58(H) 42(H) 36(H)  Creatinine 0.61 - 1.24 mg/dL 2.04(H) 1.95(H) 1.77(H)  BUN/Creat Ratio 6 - 22 (calc) - - -  Sodium 135 - 145 mmol/L 145 145 142  Potassium 3.5 - 5.1 mmol/L 4.3 3.3(L) 4.0  Chloride 98 - 111 mmol/L 111 114(H) 103  CO2 22 - 32 mmol/L 27 25 28   Calcium 8.9 - 10.3 mg/dL 9.1 8.0(L) 10.1    Terrilee Croak, MD  [facility] 03/12/2018

## 2018-03-12 NOTE — Care Management Important Message (Signed)
Important Message  Patient Details  Name: KABIR BRANNOCK MRN: [id number] Date of Birth: [birth year]   Medicare Important Message Given:  Yes    Kerin Salen 03/12/2018, 10:51 [place] Message  Patient Details  Name: JEFFRIE LOFSTROM MRN: [id number] Date of Birth: Jan 31, 1934   Medicare Important Message Given:  Yes    Kerin Salen 03/12/2018, 10:50 AM

## 2018-03-13 LAB — BASIC METABOLIC PANEL
Anion gap: 6 (ref 5–15)
BUN: 55 mg/dL — ABNORMAL HIGH (ref 8–23)
CO2: 26 mmol/L (ref 22–32)
Calcium: 9.1 mg/dL (ref 8.9–10.3)
Chloride: 114 mmol/L — ABNORMAL HIGH (ref 98–111)
Creatinine, Ser: 1.73 mg/dL — ABNORMAL HIGH (ref 0.61–1.24)
GFR calc Af Amer: 42 mL/min — ABNORMAL LOW (ref >60)
GFR calc non Af Amer: 36 mL/min — ABNORMAL LOW (ref >60)
Glucose, Bld: 106 mg/dL — ABNORMAL HIGH (ref 70–99)
Potassium: 4.3 mmol/L (ref 3.5–5.1)
Sodium: 146 mmol/L — ABNORMAL HIGH (ref 135–145)

## 2018-03-13 LAB — CBC WITH DIFFERENTIAL/PLATELET
Abs Immature Granulocytes: 0.07 10*3/uL (ref 0.00–0.07)
Basophils Absolute: 0 10*3/uL (ref 0.0–0.1)
Basophils Relative: 0 %
Eosinophils Absolute: 0 10*3/uL (ref 0.0–0.5)
Eosinophils Relative: 0 %
HCT: 26.9 % — ABNORMAL LOW (ref 39.0–52.0)
Hemoglobin: 8.3 g/dL — ABNORMAL LOW (ref 13.0–17.0)
Immature Granulocytes: 2 %
Lymphocytes Relative: 21 %
Lymphs Abs: 0.7 10*3/uL (ref 0.7–4.0)
MCH: 30.3 pg (ref 26.0–34.0)
MCHC: 30.9 g/dL (ref 30.0–36.0)
MCV: 98.2 fL (ref 80.0–100.0)
Monocytes Absolute: 0.1 10*3/uL (ref 0.1–1.0)
Monocytes Relative: 3 %
Neutro Abs: 2.7 10*3/uL (ref 1.7–7.7)
Neutrophils Relative %: 74 %
PLATELETS: 10 10*3/uL — AB (ref 150–400)
RBC: 2.74 MIL/uL — AB (ref 4.22–5.81)
RDW: 15.8 % — ABNORMAL HIGH (ref 11.5–15.5)
WBC: 3.6 10*3/uL — ABNORMAL LOW (ref 4.0–10.5)
nRBC: 0 % (ref 0.0–0.2)

## 2018-03-13 LAB — HEPATIC FUNCTION PANEL
ALT: 97 U/L — ABNORMAL HIGH (ref 0–44)
AST: 201 U/L — ABNORMAL HIGH (ref 15–41)
Albumin: 2.4 g/dL — ABNORMAL LOW (ref 3.5–5.0)
Alkaline Phosphatase: 264 U/L — ABNORMAL HIGH (ref 38–126)
BILIRUBIN INDIRECT: 0.8 mg/dL (ref 0.3–0.9)
Bilirubin, Direct: 1.4 mg/dL — ABNORMAL HIGH (ref 0.0–0.2)
Total Bilirubin: 2.2 mg/dL — ABNORMAL HIGH (ref 0.3–1.2)
Total Protein: 4.8 g/dL — ABNORMAL LOW (ref 6.5–8.1)

## 2018-03-13 LAB — BPAM PLATELET PHERESIS
Blood Product Expiration Date: 202002042359
ISSUE DATE / TIME: 202002031735
Unit Type and Rh: 5100

## 2018-03-13 LAB — PREPARE PLATELET PHERESIS: Unit division: 0

## 2018-03-13 LAB — LACTATE DEHYDROGENASE: LDH: 570 U/L — ABNORMAL HIGH (ref 98–192)

## 2018-03-13 LAB — URIC ACID: Uric Acid, Serum: 8 mg/dL (ref 3.7–8.6)

## 2018-03-13 MED ORDER — SODIUM CHLORIDE 0.9% IV SOLUTION
Freq: Once | INTRAVENOUS | Status: AC
  Administered 2018-03-14: 08:00:00 via INTRAVENOUS

## 2018-03-13 MED ORDER — SODIUM CHLORIDE 0.9 % IV SOLN
250.0000 mL | INTRAVENOUS | Status: DC
  Administered 2018-03-13: 250 mL via INTRAVENOUS

## 2018-03-13 MED ORDER — LIP MEDEX EX OINT
TOPICAL_OINTMENT | CUTANEOUS | Status: AC
Start: 2018-03-13 — End: 2018-03-13
  Administered 2018-03-13: 10:00:00
  Filled 2018-03-13: qty 7

## 2018-03-13 NOTE — Progress Notes (Signed)
Physical Therapy Treatment Patient Details Name: Joseph Berg MRN: [id number] DOB: 10-17-1933 Today's Date: 03/13/2018    History of Present Illness Pt is an 85 y.o. male admitted 03/05/18 with progressive SOB, found to be hypoxic on room air by HHPT. Worked up for probable pneumonia; thrombocytopenia of unclear etiology, awaiting bone marrow biopsy. CT noted diverticulitis. PMH includes CHF, CAD, AAA, recent admission 01/2018 with PNA.    PT Comments    Pt required + 2 assist.  Sit to supine: Max assist;+2 for physical assistance;+2 for safety/equipment.  Sit to Stand: +2 safety/equipment;+2 physical assistance;Mod assist.  General Gait Details: very limited distance and very short, shuffled steps.  Limited distance due to dyspnea.  HR 88 and 4 lts O2 93%.    Follow Up Recommendations  Home health PT;Supervision for mobility/OOB(pt progressing poorly, may need SNF)     Equipment Recommendations  None recommended by PT    Recommendations for Other Services       Precautions / Restrictions Precautions Precaution Comments: monitor sats     Mobility  Bed Mobility Overal bed mobility: Needs Assistance Bed Mobility: Supine to Sit       Sit to supine: Max assist;+2 for physical assistance;+2 for safety/equipment   General bed mobility comments: much assist from supine to EOB  Transfers Overall transfer level: Needs assistance Equipment used: None Transfers: Sit to/from Stand Sit to Stand: +2 safety/equipment;+2 physical assistance;Mod assist         General transfer comment: from elevated bed + 2 side by side assist   Ambulation/Gait Ambulation/Gait assistance: Mod assist;+2 physical assistance;+2 safety/equipment Gait Distance (Feet): 2 Feet Assistive device: Rolling walker (2 wheeled) Gait Pattern/deviations: Step-to pattern;Step-through pattern;Decreased step length - right;Decreased step length - left Gait velocity: Decreased   General Gait Details: very  limited distance and very short, shuffled steps.  Limited distance due to dyspnea.  HR 88 and 4 lts O2 93%.     Stairs             Wheelchair Mobility    Modified Rankin (Stroke Patients Only)       Balance                                            Cognition Arousal/Alertness: Awake/alert Behavior During Therapy: WFL for tasks assessed/performed Overall Cognitive Status: Within Functional Limits for tasks assessed                                        Exercises      General Comments        Pertinent Vitals/Pain Pain Assessment: Faces Faces Pain Scale: Hurts little more Pain Location: "everywhere" esp back and neck Pain Descriptors / Indicators: Grimacing Pain Intervention(s): Monitored during session;Repositioned    Home Living                      Prior Function            PT Goals (current goals can now be found in the care plan section) Progress towards PT goals: Progressing toward goals    Frequency    Min 3X/week      PT Plan Discharge plan needs to be updated    Co-evaluation  AM-PAC PT "6 Clicks" Mobility   Outcome Measure  Help needed turning from your back to your side while in a flat bed without using bedrails?: A Lot Help needed moving from lying on your back to sitting on the side of a flat bed without using bedrails?: A Lot Help needed moving to and from a bed to a chair (including a wheelchair)?: A Lot Help needed standing up from a chair using your arms (e.g., wheelchair or bedside chair)?: A Lot Help needed to walk in hospital room?: A Lot Help needed climbing 3-5 steps with a railing? : Total 6 Click Score: 11    End of Session Equipment Utilized During Treatment: Oxygen   Patient left: in chair;with call bell/phone within reach;with family/visitor present Nurse Communication: Mobility status PT Visit Diagnosis: Other abnormalities of gait and mobility  (R26.89)     Time: 1444-1510 PT Time Calculation (min) (ACUTE ONLY): 26 min  Charges:  $Gait Training: 8-22 mins $Therapeutic Activity: 8-22 mins                     Rica Koyanagi  PTA Acute  Rehabilitation Services Pager      [phone] Office      [phone]

## 2018-03-13 NOTE — Progress Notes (Signed)
PROGRESS NOTE  Joseph Berg MIW:[id number] DOB: 07-28-1933 DOA: 03/05/2018 PCP: [name], MD   LOS: 8 days   Brief narrative: Patient is a84 y.o.male with history of COPD, chronic systolic heart failure, recently hospitalized from 01/11/2018-01/18/2018-for acute hypoxic respiratory failure secondary to multifocal pneumonia. Hep[facility]ed [facility] the [facility] 1/27/20with fatigueand wasfound to haveleft lower lobe pneumonia and severe thrombocytopenia.He was evaluated by hematology, subsequently underwent bone marrow biopsy-unfortunately it appears that the patient has small cell carcinoma (most likely lung) with bone marrow involvement causing thrombocytopenia.Per hematology recommendation, patient [facility].  Patient was started on chemotherapy with carboplatin and etoposide on 03/09/2018.  Subjective: Patient was seen and examined this morning.  Pleasant elderly Caucasian male.  Looks tired.  Tries to answer orientation questions but cannot and gets frustrated.  Wife at bedside. Patient denies any physical symptoms.  Assessment/Plan:  Principal Problem:   PNA (pneumonia) Active Problems:   CAD (coronary artery disease) of artery bypass graft   Chronic systolic congestive heart failure (HCC)   AAA (abdominal aortic aneurysm) without rupture (HCC)   Acute respiratory failure with hypoxia (HCC)   Diverticulitis possible   Thrombocytopenia (HCC)   Small cell carcinoma (HCC)  Extensive stage small cell carcinoma of the lung:Most likely theconsolidation seen in the lung on imaging studieswasdue to malignancy with some evidence ofmild postobstructive pneumonia.On 03/09/2018, patient receivedchemotherapy with carboplatin, etoposide and dexamethasone. Patient has been lethargic since then.  Ocology following.  Severe thrombocytopenia-secondary to bone marrow involvement from extensive stage small cell carcinoma of  the lung.No overt bleeding,4units of platelets transfused so far.  Platelet count improved to 10 today.  Acute on chronic anemia-hemoglobin  at 8.3 today.  Received 1 unit of PRBC on 2/2.    Left lobar pneumonia:On admission, chest x-ray showed consolidation which probably is mostly lung cancer but also has component of pneumonia. Patient's urine streptococcal antigen was positive.Patient completed 7-day course of IV Rocephin. Respiratory virus panel was negative.  Currently not on antibiotics.  COPD:No evidence of exacerbation-continue bronchodilators.  Acute on chronic respiratory failure with hypoxia- patient was using 2 L oxygen via nasal cannula at home.Currently he is requiring, 4 to 5 L. Wean down as tolerated. Encourage incentive spirometry.  Encourage ambulation.  Acute disorientation- patient is partially oriented to time, not oriented to place and person.  Looks lethargic.  Not febrile.  Is disoriented likely because of malignancy, chemotherapy and electrolyte abnormalities.  Continue to monitor mental status.  Chronic systolic heart failure:Although he has a history of chronic systolic heart failure,his most recent echocardiogram showed normalization of EF.Continue Coreg. Lasix remains on hold.  CAD s/p CABG:No anginal symptoms-antiplatelets on hold due to severe thrombocytopenia.  CKD stage GNO:[id number] close to usual baseline-follow.  Elevated transaminases: No major abnormalities seen on CT of the abdomen-supportive care.  LFTs continue to remain somewhat elevated.  Continue to monitor.  Debility/deconditioning: Suspect some amount of debility/deconditioning at baseline-but worsened due to above-noted acute issues. PT evaluation appreciated-recommendations are for home health services. If does not improve physically, patient may need to go to SNF.  OSA: Continue CPAP nightly.  Hypokalemia-improved with replacement.  Acute kidney  injury-creatinine 1.4-1.5 at baseline, post chemotherapy, trended up and peaked at 2.04, trending down now.  Today creatinine is 1.72. Continue IV hydration. Monitor daily.  Hyperuricemia-secondary to tumor lysis. Uric acid level 9.6. On allopurinol.  Mobility:Encourage out of bed Diet:Regular diet.  Poor appetite.  Nutrition consulted.  May need supplements. DVT  prophylaxis:SCD boots.Will avoid heparin/Lovenox because of low platelet count Code Status:Code Status: DNR Family Communication:Family present at bedside Disposition Plan:After oncology clearance.  Consultants:  Oncology  Procedures:  None   Antimicrobials:  Anti-infectives (From admission, onward)   Start     Dose/Rate Route Frequency Ordered Stop   03/10/18 1000  cefTRIAXone (ROCEPHIN) 1 g in sodium chloride 0.9 % 100 mL IVPB  Status:  Discontinued     1 g 200 mL/hr over 30 Minutes Intravenous Every 24 hours 03/10/18 0930 03/12/18 1019   03/08/18 2245  cefTRIAXone (ROCEPHIN) 2 g in sodium chloride 0.9 % 100 mL IVPB  Status:  Discontinued     2 g 200 mL/hr over 30 Minutes Intravenous Every 24 hours 03/08/18 1124 03/10/18 0726   03/05/18 2245  cefTRIAXone (ROCEPHIN) 1 g in sodium chloride 0.9 % 100 mL IVPB  Status:  Discontinued     1 g 200 mL/hr over 30 Minutes Intravenous Every 24 hours 03/05/18 2237 03/08/18 1124   03/05/18 2245  doxycycline (VIBRA-TABS) tablet 100 mg  Status:  Discontinued     100 mg Oral Every 12 hours 03/05/18 2237 03/06/18 1311   03/05/18 2245  metroNIDAZOLE (FLAGYL) IVPB 500 mg  Status:  Discontinued     500 mg 100 mL/hr over 60 Minutes Intravenous Every 8 hours 03/05/18 2237 03/06/18 1023   03/05/18 2030  cefTRIAXone (ROCEPHIN) 2 g in sodium chloride 0.9 % 100 mL IVPB     2 g 200 mL/hr over 30 Minutes Intravenous  Once 03/05/18 2016 03/05/18 2128   03/05/18 2030  metroNIDAZOLE (FLAGYL) IVPB 500 mg     500 mg 100 mL/hr over 60 Minutes Intravenous  Once 03/05/18 2016  03/05/18 2257   03/05/18 2030  doxycycline (VIBRA-TABS) tablet 100 mg     100 mg Oral  Once 03/05/18 2016 03/05/18 2056      Infusions: . sodium chloride 10 mL/hr at 03/13/18 1041    Scheduled Meds: . sodium chloride   Intravenous Once  . sodium chloride   Intravenous Once  . sodium chloride   Intravenous Once  . allopurinol  100 mg Oral Daily  . carvedilol  12.5 mg Oral BID WC  . fluticasone furoate-vilanterol  1 puff Inhalation Daily  . mouth rinse  15 mL Mouth Rinse BID  . multivitamin with minerals  1 tablet Oral Daily  . sodium chloride flush  3 mL Intravenous Q12H  . Tbo-Filgrastim  300 mcg Subcutaneous Q2000  . umeclidinium bromide  1 puff Inhalation Daily    PRN meds: albuterol, HYDROcodone-acetaminophen, ipratropium-albuterol, senna-docusate, sodium chloride flush, sodium chloride flush, traZODone   Objective: Vitals:   03/13/18 0841 03/13/18 1535  BP:  122/76  Pulse:  76  Resp:  20  Temp:  97.8 F (36.6 C)  SpO2: 97% 96%    Intake/Output Summary (Last 24 hours) at 03/13/2018 1641 Last data filed at 03/13/2018 1535 Gross per 24 hour  Intake 1515.81 ml  Output 875 ml  Net 640.81 ml   02/03 0701 - 02/04 0700 In: 0  Out: 950 [Urine:950] Total I/O In: 1515.8 [P.O.:120; I.V.:1395.8] Out: 375 [Urine:375] Filed Weights   03/05/18 1610 03/08/18 1931  Weight: 95.3 kg 94.8 kg   Body mass index is 30.86 kg/m.   Physical Exam: GENERAL: Present, elderly, Caucasian male.  Lethargic. HENT: No scleral pallor or icterus. Pupils equally reactive to light. Oral mucosa is moist NECK: is supple, no palpable thyroid enlargement. CHEST: Clear to auscultation. No crackles or wheezes.  CVS: S1 and S2 heard, no murmur. Regular rate and rhythm. No pericardial rub. ABDOMEN: Soft, non-tender, bowel sounds are present. No palpable hepato-splenomegaly. EXTREMITIES: No edema. CNS: Opens eyes on verbal command, tries to answer orientation question is unable.  Able to follow  motor commands.  Generalized weakness present. SKIN: warm and dry without rashes.  Data Review: I have personally reviewed the laboratory data and studies available.  Recent Labs  Lab 03/09/18 0854 03/10/18 0510 03/10/18 1635 03/11/18 0405 03/12/18 0638 03/13/18 0638  WBC 6.4 8.1  --  4.4 4.1 3.6*  NEUTROABS 3.8 5.5  --  3.3 3.0 2.7  HGB 10.3* 10.7*  --  7.6* 9.0* 8.3*  HCT 33.2* 35.0*  --  25.3* 29.0* 26.9*  MCV 97.1 96.7  --  99.2 98.6 98.2  PLT 5* 5* 16* 5* <5* 10*   Recent Labs  Lab 03/09/18 0854 03/10/18 0510 03/11/18 0405 03/12/18 0638 03/13/18 0638  NA 142 142 145 145 146*  K 3.2* 4.0 3.3* 4.3 4.3  CL 102 103 114* 111 114*  CO2 _0 GLUCOSE 93 149* 141* 113* 106*  BUN 23 36* 42* 58* 55*  CREATININE 1.34* 1.77* 1.95* 2.04* 1.73*  CALCIUM 10.1 10.1 8.0* 9.1 9.1    Terrilee Croak, MD  [facility] 03/13/2018

## 2018-03-13 NOTE — Progress Notes (Signed)
IP PROGRESS NOTE  Subjective:   Joseph Berg denies bleeding and pain.  He was transfused platelets yesterday.  His wife reports he was up in the chair for several hours yesterday. Objective: Vital signs in last 24 hours: Blood pressure 140/76, pulse 96, temperature 97.9 F (36.6 C), temperature source Oral, resp. rate (!) 25, height 5' 9"  (1.753 m), weight 210 lb (95.3 kg), SpO2 92 %.  Intake/Output from previous day: 01/27 0701 - 01/28 0700 In: 240 [P.O.:240] Out: -   Physical Exam:  HEENT: Small amount of blood at the lower anterior line, several ecchymoses at the lower inner lip Lungs: Clear anteriorly, no respiratory distress Cardiac: Regular rhythm, distant heart sounds Abdomen: No hepatosplenomegaly, nontender Extremities: Trace edema at the low leg bilaterally Skin: Multiple ecchymoses over the arms and legs erythema at the tip of the right first toe   Lab Results: Recent Labs    03/05/18 1636 03/05/18 2221 03/06/18 0341  WBC 7.5  --  7.9  HGB 12.2*  --  11.6*  HCT 37.2*  --  36.1*  PLT 8* 8* 7*   Platelets 10, ANC 0.7 BMET Recent Labs    03/05/18 1636 03/05/18 1752 03/06/18 0341  NA 141  --  143  K 2.8*  --  3.2*  CL 104  --  102  CO2 24  --  26  GLUCOSE 106*  --  113*  BUN 14  --  15  CREATININE 1.45* 1.40* 1.58*  CALCIUM 9.7  --  10.5*  LDH 570, bilirubin 2.2, AST 201, ALT 97, BUN 55, creatinine 1.73, uric acid 8 Dg Chest 2 View  Result Date: 03/05/2018 CLINICAL DATA:  Short of breath.  Recent pneumonia EXAM: CHEST - 2 VIEW COMPARISON:  None. FINDINGS: Sternotomy wires overlie normal cardiac silhouette. Normal pulmonary vasculature. No effusion, infiltrate, or pneumothorax. No acute osseous abnormality. IMPRESSION: No acute cardiopulmonary process. Electronically Signed   By: Suzy Bouchard M.D.   On: 03/05/2018 15:11   Ct Angio Chest/abd/pel For Dissection W And/or W/wo  Addendum Date: 03/05/2018   ADDENDUM REPORT: 03/05/2018 20:17 ADDENDUM:  Correction: There are several omissions in the body of the chest CT report due to recent change from table mike to speech microphone. Under cardiovascular, it should state NO large central pulmonary embolus is noted. And likewise NO thoracic aortic aneurysm is identified. Under lungs/pleura common should state: As stated above, there is pulmonary consolidation in the superior segment of the left lower lobe abutting the pleura. A small right effusion and atelectasis is noted. Centrilobular emphysema is noted, upper lobe predominant. Findings discussed with Joseph Berg. Electronically Signed   By: Ashley Royalty M.D.   On: 03/05/2018 20:17   Result Date: 03/05/2018 CLINICAL DATA:  Dyspnea on exertion. Recently diagnosed 12 5 discharge 36 EXAM: CT ANGIOGRAPHY CHEST, ABDOMEN AND PELVIS TECHNIQUE: Multidetector CT imaging through the chest, abdomen and pelvis was performed using the standard protocol during bolus administration of intravenous contrast. Multiplanar reconstructed images and MIPs were obtained and reviewed to evaluate the vascular anatomy. CONTRAST:  145m ISOVUE-370 IOPAMIDOL (ISOVUE-370) INJECTION 76% COMPARISON:  None. FINDINGS: CTA CHEST FINDINGS Cardiovascular: The unenhanced images through the chest demonstrate. Atherosclerosis of the thoracic aorta branch vessels are. Native main three-vessel coronary arteriosclerosis is noted with post CABG change present heart size is top normal effusion After IV contrast administration thoracic aortic aneurysm is identified. Linear streak artifacts are noted within the common carotid and subclavian veins emanating hyperdense contrast the adjacent left subclavian  conclusive evidence dissection. Large central embolus is noted though the study is not tailored toward assessment emboli Mediastinum/Nodes: Normal sized thyroid gland without dominant mass. Mildly enlarged left tracheobronchial lymph nodes measuring up to 12 mm short axis larger 2 cm short axis left  hilar soft tissue density/lymph nodes noted. These may be reactive given what appear to pulmonary consolidations in the superior segment of the left lower lobe compatible with pneumonia and/or atelectasis. Patent trachea and mainstem bronchi. The CT appearance of the esophagus is unremarkable. Lungs/Pleura: As stated above, there is pulmonary consolidation in superior segment of the lower lobe abutting the small right effusion and atelectasis. Centrilobular emphysema is, upper lobe. No pneumothorax. Musculoskeletal: No chest wall abnormality. No acute or significant osseous findings. Mild degenerative change along the thoracic spine. Median sternotomy sutures are in place. Review of the MIP images confirms the above findings. CTA ABDOMEN AND PELVIS FINDINGS VASCULAR Aorta: 4.5 cm infrarenal abdominal aortic aneurysm crescentic soft plaque along the posterior and left lateral wall series 8/92. Dissection. Celiac: Minimal atherosclerosis at the origin celiac axis patent branch vessels with atherosclerosis along the splenic artery dissection, aneurysm or significant stenosis. SMA: Patent with atherosclerotic origins. Occlusion or dissection aneurysm Renals: Single bilateral renal arteries atherosclerosis but fibromuscular dysplasia, stenosis, aneurysm or dissection identified. IMA: Patent Inflow: Patent without evidence of aneurysm, dissection, vasculitis or significant stenosis. Atherosclerosis common iliac arteries probable chronic calcified dissection involving the proximal left common iliac artery, series 8/233. Veins: No obvious venous abnormality within the limitations of this arterial phase study. Review of the MIP images confirms the above findings. NON-VASCULAR Hepatobiliary: No focal liver abnormality is seen. No gallstones, gallbladder wall thickening, or biliary dilatation. Pancreas: Mild ectasia of the pancreatic duct. No inflammation or dominant enhancing mass. Spleen: Normal Adrenals/Urinary Tract:  Normal bilateral adrenal glands. Bilateral renal cysts without obstructive uropathy. No solid enhancing masses are noted. The dependent layering bladder calculi are identified. Diverticulosis of the bladder is also noted at the dome of the bladder. Stomach/Bowel: Decompressed stomach with normal duodenal sweep and ligament of Treitz position. No acute bowel obstruction. Extensive colonic diverticulosis is noted along the descending sigmoid colon. Suggestion mild pericolonic inflammation along the mid sigmoid raises the possibility of a subtle sigmoid diverticulitis, series 8/250. Lymphatic: No chest, abdomen or pelvic adenopathy. Reproductive: Mildly enlarged prostate with peripheral zone calcifications. Other: No free air or free fluid. Musculoskeletal: Lumbar spondylosis with multilevel degenerative disc and endplate changes. Schmorl's node involving the superior endplate of L3. Review of the MIP images confirms the above findings. IMPRESSION: 1. 4.5 cm infrarenal abdominal aortic aneurysm with crescentic soft plaque along the posterior and left lateral wall. Patent branch vessels with atherosclerosis 2. No thoracic aortic aneurysm or dissection. No acute pulmonary embolus. 3. Left lower lobe pneumonia with with mediastinal and left hilar reactive adenopathy. Follow-up to assure resolution is suggested. 4. Bilateral renal cysts. 5. Descending and sigmoid colonic diverticulosis with subtle pericolonic fatty induration along the mid sigmoid raises the possibility of a mild uncomplicated sigmoid diverticulitis. Electronically Signed: By: Ashley Royalty M.D. On: 03/05/2018 19:57    Medications: I have reviewed the patient's current medications.  Assessment/Plan:  1.    Extensive stage small cell lung cancer  CT chest 03/05/2018-left hilar mass, small mediastinal lymph nodes, atelectasis versus consolidation versus mass in the superior segment of the left lower lobe  Bone marrow biopsy 03/07/2018-extensive  involvement of the bone marrow with metastatic small cell carcinoma consistent with a lung primary, small foci of  non-small cell differentiation  Cycle 1 etoposide/carboplatin 03/09/2018, G-CSF started 03/12/2018 2.  Thrombocytopenia secondary to #1 3.  COPD 4.  History of coronary artery disease 5.  CHF 6.  BPH 7.  Admission with pneumonia December 2019 8.  Mild renal insufficiency 9.  Elevated liver enzymes 10.  Coagulopathy 11.  Mild elevation of the calcium level  Joseph Berg is now at day 5 following cycle 1 etoposide/carboplatin.  His overall status appears unchanged.  The creatinine is improved today and the uric acid is now in the normal range.  The bilirubin is lower.  The platelet count increased following a platelet transfusion yesterday.  We will request platelets be available for transfusion tomorrow.   Recommendations: 1.  Continue G-CSF 2.  Continue intravenous hydration and allopurinol 3.  Vitamin K 4.  Dyspnea/COPD management per the medical service  5.  Plan for platelet transfusion as needed tomorrow     LOS: 1 day   [name], MD   03/06/2018, 8:55 AM

## 2018-03-13 NOTE — Progress Notes (Signed)
Patient continues to not use nocturnal CPAP. Wife declines use at this time stating he is noncompliant at home as well as during this admission. Equipment remains at bedside. RT will continue to follow.

## 2018-03-14 ENCOUNTER — Inpatient Hospital Stay (HOSPITAL_COMMUNITY): Payer: Medicare PPO

## 2018-03-14 DIAGNOSIS — I714 Abdominal aortic aneurysm, without rupture: Secondary | ICD-10-CM

## 2018-03-14 DIAGNOSIS — C801 Malignant (primary) neoplasm, unspecified: Secondary | ICD-10-CM

## 2018-03-14 DIAGNOSIS — J9601 Acute respiratory failure with hypoxia: Secondary | ICD-10-CM

## 2018-03-14 LAB — HEPATIC FUNCTION PANEL
ALT: 87 U/L — ABNORMAL HIGH (ref 0–44)
AST: 185 U/L — ABNORMAL HIGH (ref 15–41)
Albumin: 2.4 g/dL — ABNORMAL LOW (ref 3.5–5.0)
Alkaline Phosphatase: 284 U/L — ABNORMAL HIGH (ref 38–126)
Bilirubin, Direct: 1.2 mg/dL — ABNORMAL HIGH (ref 0.0–0.2)
Indirect Bilirubin: 0.8 mg/dL (ref 0.3–0.9)
Total Bilirubin: 2 mg/dL — ABNORMAL HIGH (ref 0.3–1.2)
Total Protein: 4.9 g/dL — ABNORMAL LOW (ref 6.5–8.1)

## 2018-03-14 LAB — BASIC METABOLIC PANEL
Anion gap: 5 (ref 5–15)
BUN: 53 mg/dL — ABNORMAL HIGH (ref 8–23)
CO2: 27 mmol/L (ref 22–32)
Calcium: 9.2 mg/dL (ref 8.9–10.3)
Chloride: 116 mmol/L — ABNORMAL HIGH (ref 98–111)
Creatinine, Ser: 1.51 mg/dL — ABNORMAL HIGH (ref 0.61–1.24)
GFR calc Af Amer: 49 mL/min — ABNORMAL LOW (ref >60)
GFR calc non Af Amer: 42 mL/min — ABNORMAL LOW (ref >60)
Glucose, Bld: 105 mg/dL — ABNORMAL HIGH (ref 70–99)
Potassium: 4.4 mmol/L (ref 3.5–5.1)
Sodium: 148 mmol/L — ABNORMAL HIGH (ref 135–145)

## 2018-03-14 LAB — TSH: TSH: 2.582 u[IU]/mL (ref 0.350–4.500)

## 2018-03-14 LAB — LACTATE DEHYDROGENASE: LDH: 566 U/L — ABNORMAL HIGH (ref 98–192)

## 2018-03-14 LAB — AMMONIA: Ammonia: 57 umol/L — ABNORMAL HIGH (ref 9–35)

## 2018-03-14 MED ORDER — IPRATROPIUM-ALBUTEROL 0.5-2.5 (3) MG/3ML IN SOLN
3.0000 mL | Freq: Four times a day (QID) | RESPIRATORY_TRACT | Status: DC
  Administered 2018-03-14 (×2): 3 mL via RESPIRATORY_TRACT
  Filled 2018-03-14 (×2): qty 3

## 2018-03-14 MED ORDER — SODIUM CHLORIDE 0.9% IV SOLUTION: Freq: Once | INTRAVENOUS | Status: DC

## 2018-03-14 MED ORDER — IPRATROPIUM-ALBUTEROL 0.5-2.5 (3) MG/3ML IN SOLN
3.0000 mL | Freq: Three times a day (TID) | RESPIRATORY_TRACT | Status: DC
  Administered 2018-03-15 – 2018-03-21 (×19): 3 mL via RESPIRATORY_TRACT
  Filled 2018-03-14 (×18): qty 3

## 2018-03-14 MED ORDER — ENSURE ENLIVE PO LIQD
237.0000 mL | Freq: Two times a day (BID) | ORAL | Status: DC
  Administered 2018-03-15 – 2018-03-21 (×12): 237 mL via ORAL

## 2018-03-14 MED ORDER — LACTULOSE 10 GM/15ML PO SOLN
30.0000 g | Freq: Two times a day (BID) | ORAL | Status: DC
  Administered 2018-03-14 – 2018-03-16 (×5): 30 g via ORAL
  Filled 2018-03-14 (×5): qty 45

## 2018-03-14 NOTE — Progress Notes (Signed)
Patient continues to decline nocturnal CPAP. Order changed to prn per RT protocol. Equipment removed from bedside.

## 2018-03-14 NOTE — Progress Notes (Signed)
PROGRESS NOTE    Joseph Berg  DTO:[id number] DOB: Dec 20, 1933 DOA: 03/05/2018 PCP: [name], MD   Brief Narrative:  85 year old with history of COPD, chronic systolic congestive heart failure, coronary artery disease, thrombocytopenia initially admitted to [facility] for left lower lobe pneumonia with severe thrombocytopenia.  Subsequently underwent bone marrow biopsy which showed concerns for small cell carcinoma-likely lung with bone marrow involvement.  Patient was transferred to [facility] long for chemotherapy.  He was started on carboplatin and etoposide on 1/31   Assessment & Plan:   Principal Problem:   PNA (pneumonia) Active Problems:   CAD (coronary artery disease) of artery bypass graft   Chronic systolic congestive heart failure (HCC)   AAA (abdominal aortic aneurysm) without rupture (HCC)   Acute respiratory failure with hypoxia (HCC)   Diverticulitis possible   Thrombocytopenia (HCC)   Small cell carcinoma (HCC)  Small cell carcinoma of the lung Mild acute respiratory distress with hypoxia, requiring 3-4 L nasal cannula -We will add scheduled bronchodilators, PRN bronchodilators.  Flutter valve and incentive spirometry - Mentation is better this morning.  Hold off on getting MRI/CT of the head - Chest x-ray this morning shows chronic left basilar changes, right PICC line satisfactory. -Chemotherapy per oncology team -TSH within normal limits, ammonia slightly elevated at 57 -Check BMP tomorrow  Severe thrombocytopenia - Secondary to bone marrow involvement from malignancy.  Continue granulocyte stimulating factor - Transfuse 1 unit platelets-which is in process right now  Elevated ammonia level, 57 - Add lactulose twice daily, titrate to 2-4 bowel movements daily  Left lower lobe pneumonia -Streptococcal antigen positive, completed 7 days of Rocephin -Respiratory viral panel negative.  Hold off on further antibiotics  Chronic systolic  congestive heart failure -Continue Coreg, Lasix on hold.  Stable  Coronary artery disease status post CABG -Denies any chest pain, appears to be stable.    Obstructive sleep apnea -Unfortunately patient is off-and-on noncompliant with his CPAP.  Acute kidney injury, improved -With IV hydration creatinine has trended down to 1.51.   DVT prophylaxis: Out of bed to chair Code Status: DNR Family Communication: Wife at bedside Disposition Plan: To be determined  Consultants:   Oncology  Procedures:   Bone marrow biopsy 1/29  Antimicrobials:   Completed course of Rocephin   Subjective: Still feeling extremely weak.  Poor respiratory effort when attempted to use flutter valve.  Per wife his mentation is better.  Review of Systems Otherwise negative except as per HPI, including: General: Denies fever, chills, night sweats or unintended weight loss. Resp: Denies wheezing Cardiac: Denies chest pain, palpitations, orthopnea, paroxysmal nocturnal dyspnea. GI: Denies abdominal pain, nausea, vomiting, diarrhea or constipation GU: Denies dysuria, frequency, hesitancy or incontinence MS: Denies muscle aches, joint pain or swelling Neuro: Denies headache, neurologic deficits (focal weakness, numbness, tingling), abnormal gait Psych: Denies anxiety, depression, SI/HI/AVH Skin: Denies new rashes or lesions ID: Denies sick contacts, exotic exposures, travel  Objective: Vitals:   03/14/18 0835 03/14/18 0840 03/14/18 0841 03/14/18 1119  BP:    118/72  Pulse:    71  Resp:    19  Temp:    97.6 F (36.4 C)  TempSrc:    Oral  SpO2: 99% 99% 99% 97%  Weight:      Height:        Intake/Output Summary (Last 24 hours) at 03/14/2018 1136 Last data filed at 03/14/2018 1119 Gross per 24 hour  Intake 572.86 ml  Output 900 ml  Net -327.14 ml  Filed Weights   03/05/18 1610 03/08/18 1931  Weight: 95.3 kg 94.8 kg    Examination:  General exam: Appears calm and comfortable,  chronically ill-appearing Respiratory system: Diffuse diminished breath sounds Cardiovascular system: S1 & S2 heard, RRR. No JVD, murmurs, rubs, gallops or clicks. No pedal edema. Gastrointestinal system: Abdomen is nondistended, soft and nontender. No organomegaly or masses felt. Normal bowel sounds heard. Central nervous system: Alert and oriented. No focal neurological deficits. Extremities: Symmetric 4 x 5 power. Skin: No rashes, lesions or ulcers Psychiatry: Judgement and insight appear normal. Mood & affect appropriate.     Data Reviewed:   CBC: Recent Labs  Lab 03/10/18 0510 03/10/18 1635 03/11/18 0405 03/12/18 1308 03/13/18 6578 03/14/18 0635  WBC 8.1  --  4.4 4.1 3.6* 4.4  NEUTROABS 5.5  --  3.3 3.0 2.7 2.8  HGB 10.7*  --  7.6* 9.0* 8.3* 8.4*  HCT 35.0*  --  25.3* 29.0* 26.9* 27.4*  MCV 96.7  --  99.2 98.6 98.2 98.2  PLT 5* 16* 5* <5* 10* 7*   Basic Metabolic Panel: Recent Labs  Lab 03/10/18 0510 03/11/18 0405 03/12/18 0638 03/13/18 0638 03/14/18 0635  NA 142 145 145 146* 148*  K 4.0 3.3* 4.3 4.3 4.4  CL 103 114* 111 114* 116*  CO2 28 25 27 26 27   GLUCOSE 149* 141* 113* 106* 105*  BUN 36* 42* 58* 55* 53*  CREATININE 1.77* 1.95* 2.04* 1.73* 1.51*  CALCIUM 10.1 8.0* 9.1 9.1 9.2   GFR: Estimated Creatinine Clearance: 42.8 mL/min (A) (by C-G formula based on SCr of 1.51 mg/dL (H)). Liver Function Tests: Recent Labs  Lab 03/10/18 0510 03/11/18 0405 03/12/18 4696 03/13/18 2952 03/14/18 0635  AST 274* 192* 201* 201* 185*  ALT 109* 85* 89* 97* 87*  ALKPHOS 172* 185* 251* 264* 284*  BILITOT 4.0* 2.9* 2.9* 2.2* 2.0*  PROT 6.0* 4.4* 5.0* 4.8* 4.9*  ALBUMIN 3.1* 2.2* 2.4* 2.4* 2.4*   No results for input(s): LIPASE, AMYLASE in the last 168 hours. Recent Labs  Lab 03/14/18 1028  AMMONIA 57*   Coagulation Profile: Recent Labs  Lab 03/10/18 0815 03/12/18 0638  INR 1.33 1.35   Cardiac Enzymes: No results for input(s): CKTOTAL, CKMB, CKMBINDEX,  TROPONINI in the last 168 hours. BNP (last 3 results) No results for input(s): PROBNP in the last 8760 hours. HbA1C: No results for input(s): HGBA1C in the last 72 hours. CBG: No results for input(s): GLUCAP in the last 168 hours. Lipid Profile: No results for input(s): CHOL, HDL, LDLCALC, TRIG, CHOLHDL, LDLDIRECT in the last 72 hours. Thyroid Function Tests: Recent Labs    03/14/18 1028  TSH 2.582   Anemia Panel: No results for input(s): VITAMINB12, FOLATE, FERRITIN, TIBC, IRON, RETICCTPCT in the last 72 hours. Sepsis Labs: No results for input(s): PROCALCITON, LATICACIDVEN in the last 168 hours.  Recent Results (from the past 240 hour(s))  Urine culture     Status: None   Collection Time: 03/05/18  6:48 PM  Result Value Ref Range Status   Specimen Description URINE, RANDOM  Final   Special Requests NONE  Final   Culture   Final    NO GROWTH Performed at [facility], [address], [place], [place] [address]    Report Status 03/07/2018 FINAL  Final  Culture, blood (routine x 2)     Status: None   Collection Time: 03/05/18  8:38 PM  Result Value Ref Range Status   Specimen Description BLOOD  RIGHT HAND  Final   Special Requests   Final    BOTTLES DRAWN AEROBIC AND ANAEROBIC Blood Culture results may not be optimal due to an inadequate volume of blood received in culture bottles Performed at [place] [address] [place], [place] [address]    Culture NO GROWTH 5 DAYS  Final   Report Status 03/10/2018 FINAL  Final  Culture, blood (routine x 2)     Status: None   Collection Time: 03/05/18  8:39 PM  Result Value Ref Range Status   Specimen Description BLOOD LEFT HAND  Final   Special Requests   Final    BOTTLES DRAWN AEROBIC ONLY Blood Culture results may not be optimal due to an inadequate volume of blood received in culture bottles Performed at [place] [address], [place], [place] [address]    Culture NO GROWTH 5 DAYS  Final    Report Status 03/10/2018 FINAL  Final  MRSA PCR Screening     Status: None   Collection Time: 03/06/18  1:36 AM  Result Value Ref Range Status   MRSA by PCR NEGATIVE NEGATIVE Final    Comment:        The GeneXpert MRSA Assay (FDA approved for NASAL specimens only), is one component of a comprehensive MRSA colonization surveillance program. It is not intended to diagnose MRSA infection nor to guide or monitor treatment for MRSA infections. Performed at [facility], [place] [address] [place], [place] [address]   Respiratory Panel by PCR     Status: None   Collection Time: 03/06/18  1:58 AM  Result Value Ref Range Status   Adenovirus NOT DETECTED NOT DETECTED Final   Coronavirus 229E NOT DETECTED NOT DETECTED Final   Coronavirus HKU1 NOT DETECTED NOT DETECTED Final   Coronavirus NL63 NOT DETECTED NOT DETECTED Final   Coronavirus OC43 NOT DETECTED NOT DETECTED Final   Metapneumovirus NOT DETECTED NOT DETECTED Final   Rhinovirus / Enterovirus NOT DETECTED NOT DETECTED Final   Influenza A NOT DETECTED NOT DETECTED Final   Influenza B NOT DETECTED NOT DETECTED Final   Parainfluenza Virus 1 NOT DETECTED NOT DETECTED Final   Parainfluenza Virus 2 NOT DETECTED NOT DETECTED Final   Parainfluenza Virus 3 NOT DETECTED NOT DETECTED Final   Parainfluenza Virus 4 NOT DETECTED NOT DETECTED Final   Respiratory Syncytial Virus NOT DETECTED NOT DETECTED Final   Bordetella pertussis NOT DETECTED NOT DETECTED Final   Chlamydophila pneumoniae NOT DETECTED NOT DETECTED Final   Mycoplasma pneumoniae NOT DETECTED NOT DETECTED Final    Comment: Performed at [facility], [place] [address] [place], [place] [address]  Expectorated sputum assessment w rflx to resp cult     Status: None   Collection Time: 03/09/18  5:30 PM  Result Value Ref Range Status   Specimen Description EXPECTORATED SPUTUM  Final   Special Requests NONE  Final   Sputum evaluation   Final    THIS SPECIMEN IS ACCEPTABLE FOR  SPUTUM CULTURE Performed at [facility], [place] [address], [place], [place] [address]    Report Status 03/09/2018 FINAL  Final  Culture, respiratory     Status: None   Collection Time: 03/09/18  5:30 PM  Result Value Ref Range Status   Specimen Description   Final    EXPECTORATED SPUTUM Performed at [place] [facility] [place] [address], [place],  [address]    Special Requests   Final  NONE Reflexed from [id number] Performed at [facility], [place] [address] [place], [place] [address]    Gram Stain   Final    ABUNDANT WBC PRESENT,BOTH PMN AND MONONUCLEAR FEW GRAM VARIABLE ROD FEW GRAM POSITIVE COCCI RARE YEAST Performed at [facility], [place] [address], [place], [place] [address]    Culture FEW Consistent with normal respiratory flora.  Final   Report Status 03/12/2018 FINAL  Final         Radiology Studies: Dg Chest Port 1 View  Result Date: 03/14/2018 CLINICAL DATA:  Increased shortness of breath EXAM: PORTABLE CHEST 1 VIEW COMPARISON:  03/05/2018 FINDINGS: Cardiac shadow remains enlarged. Postsurgical changes are again seen. Right-sided PICC line is noted in satisfactory position in the distal superior vena cava. Chronic blunting of left costophrenic angle is noted. No focal infiltrate or effusion is seen. No bony abnormality is noted. IMPRESSION: Chronic left basilar changes. Right PICC line in satisfactory position. Electronically Signed   By: Inez Catalina M.D.   On: 03/14/2018 10:06        Scheduled Meds: . sodium chloride   Intravenous Once  . sodium chloride   Intravenous Once  . allopurinol  100 mg Oral Daily  . carvedilol  12.5 mg Oral BID WC  . fluticasone furoate-vilanterol  1 puff Inhalation Daily  . mouth rinse  15 mL Mouth Rinse BID  . multivitamin with minerals  1 tablet Oral Daily  . sodium chloride flush  3 mL Intravenous Q12H  . Tbo-Filgrastim  300 mcg Subcutaneous Q2000  . umeclidinium bromide   1 puff Inhalation Daily   Continuous Infusions: . sodium chloride 10 mL/hr at 03/14/18 0700     LOS: 9 days   Time spent= 35 mins    Juaquina Machnik Arsenio Loader, MD [facility]  If 7PM-7AM, please contact night-coverage [email or web address] 03/14/2018, 11:36 AM

## 2018-03-14 NOTE — Progress Notes (Signed)
IP PROGRESS NOTE  Subjective:   Joseph Berg has no specific complaint.  His wife is at the bedside.  She reports he has a poor appetite.  He was out of the chair for several hours yesterday.  She has noted blood in his urine. Objective: Vital signs in last 24 hours: Blood pressure 140/76, pulse 96, temperature 97.9 F (36.6 C), temperature source Oral, resp. rate (!) 25, height 5' 9"  (1.753 m), weight 210 lb (95.3 kg), SpO2 92 %.  Intake/Output from previous day: 01/27 0701 - 01/28 0700 In: 240 [P.O.:240] Out: -   Physical Exam:  HEENT: Dried blood over the tongue and at the lower anterior gumline, several ecchymoses at the lower lip Lungs: Clear anteriorly, no respiratory distress Cardiac: Regular rhythm, distant heart sounds Abdomen: No hepatosplenomegaly, mild tenderness in the right upper abdomen Extremities: Trace edema at the low leg bilaterally Skin: Multiple ecchymoses over the arms and legs erythema at the tip of the right first toe Neurologic: Alert, follows commands, not oriented to place or day   Lab Results: Recent Labs    03/05/18 1636 03/05/18 2221 03/06/18 0341  WBC 7.5  --  7.9  HGB 12.2*  --  11.6*  HCT 37.2*  --  36.1*  PLT 8* 8* 7*   Platelets 7, ANC 2.8 BMET Recent Labs    03/05/18 1636 03/05/18 1752 03/06/18 0341  NA 141  --  143  K 2.8*  --  3.2*  CL 104  --  102  CO2 24  --  26  GLUCOSE 106*  --  113*  BUN 14  --  15  CREATININE 1.45* 1.40* 1.58*  CALCIUM 9.7  --  10.5*  LDH 566,, AST 185, ALT 87, bilirubin 1.2 Dg Chest 2 View  Result Date: 03/05/2018 CLINICAL DATA:  Short of breath.  Recent pneumonia EXAM: CHEST - 2 VIEW COMPARISON:  None. FINDINGS: Sternotomy wires overlie normal cardiac silhouette. Normal pulmonary vasculature. No effusion, infiltrate, or pneumothorax. No acute osseous abnormality. IMPRESSION: No acute cardiopulmonary process. Electronically Signed   By: Suzy Bouchard M.D.   On: 03/05/2018 15:11   Ct Angio  Chest/abd/pel For Dissection W And/or W/wo  Addendum Date: 03/05/2018   ADDENDUM REPORT: 03/05/2018 20:17 ADDENDUM: Correction: There are several omissions in the body of the chest CT report due to recent change from table mike to speech microphone. Under cardiovascular, it should state NO large central pulmonary embolus is noted. And likewise NO thoracic aortic aneurysm is identified. Under lungs/pleura common should state: As stated above, there is pulmonary consolidation in the superior segment of the left lower lobe abutting the pleura. A small right effusion and atelectasis is noted. Centrilobular emphysema is noted, upper lobe predominant. Findings discussed with Joseph Berg. Electronically Signed   By: Ashley Royalty M.D.   On: 03/05/2018 20:17   Result Date: 03/05/2018 CLINICAL DATA:  Dyspnea on exertion. Recently diagnosed 12 5 discharge 70 EXAM: CT ANGIOGRAPHY CHEST, ABDOMEN AND PELVIS TECHNIQUE: Multidetector CT imaging through the chest, abdomen and pelvis was performed using the standard protocol during bolus administration of intravenous contrast. Multiplanar reconstructed images and MIPs were obtained and reviewed to evaluate the vascular anatomy. CONTRAST:  116m ISOVUE-370 IOPAMIDOL (ISOVUE-370) INJECTION 76% COMPARISON:  None. FINDINGS: CTA CHEST FINDINGS Cardiovascular: The unenhanced images through the chest demonstrate. Atherosclerosis of the thoracic aorta branch vessels are. Native main three-vessel coronary arteriosclerosis is noted with post CABG change present heart size is top normal effusion After IV contrast administration  thoracic aortic aneurysm is identified. Linear streak artifacts are noted within the common carotid and subclavian veins emanating hyperdense contrast the adjacent left subclavian conclusive evidence dissection. Large central embolus is noted though the study is not tailored toward assessment emboli Mediastinum/Nodes: Normal sized thyroid gland without dominant  mass. Mildly enlarged left tracheobronchial lymph nodes measuring up to 12 mm short axis larger 2 cm short axis left hilar soft tissue density/lymph nodes noted. These may be reactive given what appear to pulmonary consolidations in the superior segment of the left lower lobe compatible with pneumonia and/or atelectasis. Patent trachea and mainstem bronchi. The CT appearance of the esophagus is unremarkable. Lungs/Pleura: As stated above, there is pulmonary consolidation in superior segment of the lower lobe abutting the small right effusion and atelectasis. Centrilobular emphysema is, upper lobe. No pneumothorax. Musculoskeletal: No chest wall abnormality. No acute or significant osseous findings. Mild degenerative change along the thoracic spine. Median sternotomy sutures are in place. Review of the MIP images confirms the above findings. CTA ABDOMEN AND PELVIS FINDINGS VASCULAR Aorta: 4.5 cm infrarenal abdominal aortic aneurysm crescentic soft plaque along the posterior and left lateral wall series 8/92. Dissection. Celiac: Minimal atherosclerosis at the origin celiac axis patent branch vessels with atherosclerosis along the splenic artery dissection, aneurysm or significant stenosis. SMA: Patent with atherosclerotic origins. Occlusion or dissection aneurysm Renals: Single bilateral renal arteries atherosclerosis but fibromuscular dysplasia, stenosis, aneurysm or dissection identified. IMA: Patent Inflow: Patent without evidence of aneurysm, dissection, vasculitis or significant stenosis. Atherosclerosis common iliac arteries probable chronic calcified dissection involving the proximal left common iliac artery, series 8/233. Veins: No obvious venous abnormality within the limitations of this arterial phase study. Review of the MIP images confirms the above findings. NON-VASCULAR Hepatobiliary: No focal liver abnormality is seen. No gallstones, gallbladder wall thickening, or biliary dilatation. Pancreas: Mild  ectasia of the pancreatic duct. No inflammation or dominant enhancing mass. Spleen: Normal Adrenals/Urinary Tract: Normal bilateral adrenal glands. Bilateral renal cysts without obstructive uropathy. No solid enhancing masses are noted. The dependent layering bladder calculi are identified. Diverticulosis of the bladder is also noted at the dome of the bladder. Stomach/Bowel: Decompressed stomach with normal duodenal sweep and ligament of Treitz position. No acute bowel obstruction. Extensive colonic diverticulosis is noted along the descending sigmoid colon. Suggestion mild pericolonic inflammation along the mid sigmoid raises the possibility of a subtle sigmoid diverticulitis, series 8/250. Lymphatic: No chest, abdomen or pelvic adenopathy. Reproductive: Mildly enlarged prostate with peripheral zone calcifications. Other: No free air or free fluid. Musculoskeletal: Lumbar spondylosis with multilevel degenerative disc and endplate changes. Schmorl's node involving the superior endplate of L3. Review of the MIP images confirms the above findings. IMPRESSION: 1. 4.5 cm infrarenal abdominal aortic aneurysm with crescentic soft plaque along the posterior and left lateral wall. Patent branch vessels with atherosclerosis 2. No thoracic aortic aneurysm or dissection. No acute pulmonary embolus. 3. Left lower lobe pneumonia with with mediastinal and left hilar reactive adenopathy. Follow-up to assure resolution is suggested. 4. Bilateral renal cysts. 5. Descending and sigmoid colonic diverticulosis with subtle pericolonic fatty induration along the mid sigmoid raises the possibility of a mild uncomplicated sigmoid diverticulitis. Electronically Signed: By: Ashley Royalty M.D. On: 03/05/2018 19:57    Medications: I have reviewed the patient's current medications.  Assessment/Plan:  1.    Extensive stage small cell lung cancer  CT chest 03/05/2018-left hilar mass, small mediastinal lymph nodes, atelectasis versus  consolidation versus mass in the superior segment of the left lower  lobe  Bone marrow biopsy 03/07/2018-extensive involvement of the bone marrow with metastatic small cell carcinoma consistent with a lung primary, small foci of non-small cell differentiation  Cycle 1 etoposide/carboplatin 03/09/2018, G-CSF started 03/12/2018 2.  Thrombocytopenia secondary to #1 3.  COPD 4.  History of coronary artery disease 5.  CHF 6.  BPH 7.  Admission with pneumonia December 2019 8.  Mild renal insufficiency 9.  Elevated liver enzymes 10.  Coagulopathy 11.  History of mild elevation of the calcium level  Mr. Basey is now at day 6 following cycle 1 etoposide/carboplatin.  He continues to have a poor performance status and is confused today.  The confusion may be related to delirium from hospitalization and critical illness, hepatic encephalopathy, or brain metastases.   The platelet count is stable today.  He will receive another transfusion with crossmatch platelets today.  He continues G-CSF.[name]   Recommendations: 1.  Continue G-CSF 2.  Wean oxygen as tolerated 3.  Trial of lactulose for the altered mental status 4.  Transfuse platelets today 5.   Brain CT if mental status does not improve with lactulose     LOS: 1 day   [name], MD   03/06/2018, 8:55 AM

## 2018-03-15 LAB — HEPATIC FUNCTION PANEL
ALT: 76 U/L — ABNORMAL HIGH (ref 0–44)
AST: 151 U/L — ABNORMAL HIGH (ref 15–41)
Albumin: 2.5 g/dL — ABNORMAL LOW (ref 3.5–5.0)
Alkaline Phosphatase: 270 U/L — ABNORMAL HIGH (ref 38–126)
Bilirubin, Direct: 1 mg/dL — ABNORMAL HIGH (ref 0.0–0.2)
Indirect Bilirubin: 1 mg/dL — ABNORMAL HIGH (ref 0.3–0.9)
Total Bilirubin: 2 mg/dL — ABNORMAL HIGH (ref 0.3–1.2)
Total Protein: 4.9 g/dL — ABNORMAL LOW (ref 6.5–8.1)

## 2018-03-15 LAB — BPAM PLATELET PHERESIS
Blood Product Expiration Date: 202002061112
ISSUE DATE / TIME: 202002051132
Unit Type and Rh: 5100

## 2018-03-15 LAB — BASIC METABOLIC PANEL
Anion gap: 3 — ABNORMAL LOW (ref 5–15)
BUN: 48 mg/dL — ABNORMAL HIGH (ref 8–23)
CO2: 28 mmol/L (ref 22–32)
Calcium: 9.1 mg/dL (ref 8.9–10.3)
Chloride: 119 mmol/L — ABNORMAL HIGH (ref 98–111)
Creatinine, Ser: 1.4 mg/dL — ABNORMAL HIGH (ref 0.61–1.24)
GFR calc Af Amer: 54 mL/min — ABNORMAL LOW (ref >60)
GFR calc non Af Amer: 46 mL/min — ABNORMAL LOW (ref >60)
GLUCOSE: 120 mg/dL — AB (ref 70–99)
Potassium: 4.1 mmol/L (ref 3.5–5.1)
Sodium: 150 mmol/L — ABNORMAL HIGH (ref 135–145)

## 2018-03-15 LAB — PREPARE PLATELET PHERESIS: Unit division: 0

## 2018-03-15 LAB — CBC
HCT: 25.8 % — ABNORMAL LOW (ref 39.0–52.0)
Hemoglobin: 8.1 g/dL — ABNORMAL LOW (ref 13.0–17.0)
MCH: 30.6 pg (ref 26.0–34.0)
MCHC: 31.4 g/dL (ref 30.0–36.0)
MCV: 97.4 fL (ref 80.0–100.0)
Platelets: 25 10*3/uL — CL (ref 150–400)
RBC: 2.65 MIL/uL — ABNORMAL LOW (ref 4.22–5.81)
RDW: 15.9 % — ABNORMAL HIGH (ref 11.5–15.5)
WBC: 3.4 10*3/uL — ABNORMAL LOW (ref 4.0–10.5)
nRBC: 0 % (ref 0.0–0.2)

## 2018-03-15 LAB — BRAIN NATRIURETIC PEPTIDE: B Natriuretic Peptide: 575.4 pg/mL — ABNORMAL HIGH (ref 0.0–100.0)

## 2018-03-15 LAB — MAGNESIUM: Magnesium: 2.1 mg/dL (ref 1.7–2.4)

## 2018-03-15 MED ORDER — DEXTROSE-NACL 5-0.45 % IV SOLN
INTRAVENOUS | Status: AC
  Administered 2018-03-15 – 2018-03-16 (×2): via INTRAVENOUS

## 2018-03-15 NOTE — Care Management Important Message (Signed)
Important Message  Patient Details  Name: Joseph Berg MRN: [id number] Date of Birth: 09-12-1933   Medicare Important Message Given:  Yes    Kerin Salen 03/15/2018, 11:12 AMImportant Message  Patient Details  Name: Joseph Berg MRN: [id number] Date of Birth: 10-Jan-1934   Medicare Important Message Given:  Yes    Kerin Salen 03/15/2018, 11:11 AM

## 2018-03-15 NOTE — Progress Notes (Signed)
PT Cancellation Note  Patient Details Name: ENCARNACION SCIONEAUX MRN: [id number] DOB: 10-Jul-1933   Cancelled Treatment:     unable to arouse pt enogh to participate.  Will check back later as schedule permits.    Rica Koyanagi  PTA Acute  Rehabilitation Services Pager      [phone] Office      [phone]

## 2018-03-15 NOTE — Progress Notes (Signed)
Initial Nutrition Assessment  DOCUMENTATION CODES:   Obesity unspecified  INTERVENTION:   Continue Ensure Enlive po BID, each supplement provides 350 kcal and 20 grams of protein  NUTRITION DIAGNOSIS:   Increased nutrient needs related to cancer and cancer related treatments as evidenced by estimated needs.  GOAL:   Patient will meet greater than or equal to 90% of their needs  MONITOR:   PO intake, Supplement acceptance, Labs, Weight trends, I & O's  REASON FOR ASSESSMENT:   Consult Assessment of nutrition requirement/status  ASSESSMENT:   85 year old with history of COPD, chronic systolic congestive heart failure, coronary artery disease, thrombocytopenia initially admitted to [facility] for left lower lobe pneumonia with severe thrombocytopenia.  Admitted for chemotherapy.  1/31: Started chemotherapy  Patient in room with no family at bedside. Pt is not a great historian, provides confused answers to questions. Pt states he ate breakfast and lunch but was unable to state what he ate. Per documentation, pt consuming 20-30% of meals. Ensure supplements have been ordered, encouraged him to drink these.   Per weight records, pt has lost 23 lb since 12/12 (10% wt loss x 2 months, significant for time frame).  Medications: Multivitamin with minerals daily, D5-.45% NaCl infusion at 50 ml/hr Labs reviewed: Elevated Na GFR: 46   NUTRITION - FOCUSED PHYSICAL EXAM:    Most Recent Value  Orbital Region  No depletion  Upper Arm Region  No depletion  Thoracic and Lumbar Region  Unable to assess  Buccal Region  No depletion  Temple Region  No depletion  Clavicle Bone Region  No depletion  Clavicle and Acromion Bone Region  No depletion  Scapular Bone Region  Unable to assess  Dorsal Hand  No depletion  Patellar Region  Unable to assess  Anterior Thigh Region  Unable to assess  Posterior Calf Region  Unable to assess  Edema (RD Assessment)  None       Diet Order:    Diet Order            Diet regular Room service appropriate? Yes; Fluid consistency: Thin  Diet effective now              EDUCATION NEEDS:   Not appropriate for education at this time  Skin:  Skin Assessment: Reviewed RN Assessment  Last BM:  2/6  Height:   Ht Readings from Last 1 Encounters:  03/08/18 5\' 9"  (1.753 m)    Weight:   Wt Readings from Last 1 Encounters:  03/08/18 94.8 kg    Ideal Body Weight:  72.7 kg  BMI:  Body mass index is 30.86 kg/m.  Estimated Nutritional Needs:   Kcal:  2100-2300  Protein:  105-110g  Fluid:  2.1L/day   Clayton Bibles, MS, RD, LDN [place] Dietitian Pager: [phone] After Hours Pager: [phone]

## 2018-03-15 NOTE — Progress Notes (Signed)
PROGRESS NOTE    Joseph Berg  GYJ:[id number] DOB: Sep 05, 1933 DOA: 03/05/2018 PCP: [name], MD   Brief Narrative:  85 year old with history of COPD, chronic systolic congestive heart failure, coronary artery disease, thrombocytopenia initially admitted to [facility] for left lower lobe pneumonia with severe thrombocytopenia.  Subsequently underwent bone marrow biopsy which showed concerns for small cell carcinoma-likely lung with bone marrow involvement.  Patient was transferred to [facility] long for chemotherapy.  He was started on carboplatin and etoposide on 1/31   Assessment & Plan:   Principal Problem:   PNA (pneumonia) Active Problems:   CAD (coronary artery disease) of artery bypass graft   Chronic systolic congestive heart failure (HCC)   AAA (abdominal aortic aneurysm) without rupture (HCC)   Acute respiratory failure with hypoxia (HCC)   Diverticulitis possible   Thrombocytopenia (HCC)   Small cell carcinoma (HCC)  Small cell carcinoma of the lung Mild acute respiratory distress with hypoxia, still requiring 3-4 L nasal cannula  -Continue scheduled and as needed bronchodilators, flutter valve/incentive spirometry -Mentation improved, if necessary we will get CT/MRI brain - Chest x-ray this morning shows chronic left basilar changes, right PICC line satisfactory. -Chemotherapy per oncology team -TSH within normal limits, ammonia slightly elevated at 57  Severe thrombocytopenia - Secondary to bone marrow involvement from malignancy.  Today's 25 after transfusion -Total platelet transfusion-5 units  Elevated ammonia level, 57 -Lactulose as needed  Left lower lobe pneumonia -Streptococcal antigen positive, completed 7 days of Rocephin -Respiratory viral panel negative.  Hold off on further antibiotics  Chronic systolic congestive heart failure -Continue Coreg, Lasix on hold.  Stable  Coronary artery disease status post CABG -Denies any chest pain,  appears to be stable.    Obstructive sleep apnea -Unfortunately patient is off-and-on noncompliant with his CPAP.  Acute kidney injury, improved -With IV hydration creatinine has trended down to 1.4.  Home health orders placed  DVT prophylaxis: Out of bed to chair Code Status: DNR Family Communication: Wife at bedside Disposition Plan: Hopefully we can discharge him to rehab versus skilled nursing facility once his platelets have remained stable and oncology clears him.  Consultants:   Oncology  Procedures:   Bone marrow biopsy 1/29  Antimicrobials:   Completed course of Rocephin   Subjective: Breathing wise he feels a little better than yesterday.  Still continues to be very weak.  Wife is at the bedside.  Review of Systems Otherwise negative except as per HPI, including: General = no fevers, chills, dizziness, malaise, fatigue HEENT/EYES = negative for pain, redness, loss of vision, double vision, blurred vision, loss of hearing, sore throat, hoarseness, dysphagia Cardiovascular= negative for chest pain, palpitation, murmurs, lower extremity swelling Respiratory/lungs= negative for shortness of breath, cough, hemoptysis, wheezing, mucus production Gastrointestinal= negative for nausea, vomiting,, abdominal pain, melena, hematemesis Genitourinary= negative for Dysuria, Hematuria, Change in Urinary Frequency MSK = Negative for arthralgia, myalgias, Back Pain, Joint swelling  Neurology= Negative for headache, seizures, numbness, tingling  Psychiatry= Negative for anxiety, depression, suicidal and homocidal ideation Allergy/Immunology= Medication/Food allergy as listed  Skin= Negative for Rash, lesions, ulcers, itching   Objective: Vitals:   03/14/18 2019 03/14/18 2049 03/15/18 0518 03/15/18 0751  BP: 124/72     Pulse: 85  70   Resp: 20  (!) 24   Temp: 98.2 F (36.8 C)  98 F (36.7 C)   TempSrc: Oral  Oral   SpO2: 92% 92% 98% 96%  Weight:      Height:  Intake/Output Summary (Last 24 hours) at 03/15/2018 1314 Last data filed at 03/15/2018 1145 Gross per 24 hour  Intake 954.45 ml  Output 600 ml  Net 354.45 ml   Filed Weights   03/05/18 1610 03/08/18 1931  Weight: 95.3 kg 94.8 kg    Examination:  Constitutional: NAD, calm, comfortable Eyes: PERRL, lids and conjunctivae normal ENMT: Mucous membranes are moist. Posterior pharynx clear of any exudate or lesions.Normal dentition.  Neck: normal, supple, no masses, no thyromegaly Respiratory: Slightly diminished breath sounds at the bases Cardiovascular: Regular rate and rhythm, no murmurs / rubs / gallops. No extremity edema. 2+ pedal pulses. No carotid bruits.  Abdomen: no tenderness, no masses palpated. No hepatosplenomegaly. Bowel sounds positive.  Musculoskeletal: no clubbing / cyanosis. No joint deformity upper and lower extremities. Good ROM, no contractures. Normal muscle tone.  Skin: no rashes, lesions, ulcers. No induration Neurologic: CN 2-12 grossly intact. Sensation intact, DTR normal. Strength 4/5 in all 4.  Psychiatric: Normal judgment and insight. Alert and oriented x 3. Normal mood.    Data Reviewed:   CBC: Recent Labs  Lab 03/10/18 0510  03/11/18 0405 03/12/18 2130 03/13/18 8657 03/14/18 0635 03/15/18 0511  WBC 8.1  --  4.4 4.1 3.6* 4.4 3.4*  NEUTROABS 5.5  --  3.3 3.0 2.7 2.8  --   HGB 10.7*  --  7.6* 9.0* 8.3* 8.4* 8.1*  HCT 35.0*  --  25.3* 29.0* 26.9* 27.4* 25.8*  MCV 96.7  --  99.2 98.6 98.2 98.2 97.4  PLT 5*   < > 5* <5* 10* 7* 25*   < > = values in this interval not displayed.   Basic Metabolic Panel: Recent Labs  Lab 03/11/18 0405 03/12/18 8469 03/13/18 0638 03/14/18 0635 03/15/18 0511  NA 145 145 146* 148* 150*  K 3.3* 4.3 4.3 4.4 4.1  CL 114* 111 114* 116* 119*  CO2 _0 GLUCOSE 141* 113* 106* 105* 120*  BUN 42* 58* 55* 53* 48*  CREATININE 1.95* 2.04* 1.73* 1.51* 1.40*  CALCIUM 8.0* 9.1 9.1 9.2 9.1  MG  --   --   --   --   2.1   GFR: Estimated Creatinine Clearance: 46.2 mL/min (A) (by C-G formula based on SCr of 1.4 mg/dL (H)). Liver Function Tests: Recent Labs  Lab 03/11/18 0405 03/12/18 6295 03/13/18 2841 03/14/18 0635 03/15/18 0511  AST 192* 201* 201* 185* 151*  ALT 85* 89* 97* 87* 76*  ALKPHOS 185* 251* 264* 284* 270*  BILITOT 2.9* 2.9* 2.2* 2.0* 2.0*  PROT 4.4* 5.0* 4.8* 4.9* 4.9*  ALBUMIN 2.2* 2.4* 2.4* 2.4* 2.5*   No results for input(s): LIPASE, AMYLASE in the last 168 hours. Recent Labs  Lab 03/14/18 1028  AMMONIA 57*   Coagulation Profile: Recent Labs  Lab 03/10/18 0815 03/12/18 0638  INR 1.33 1.35   Cardiac Enzymes: No results for input(s): CKTOTAL, CKMB, CKMBINDEX, TROPONINI in the last 168 hours. BNP (last 3 results) No results for input(s): PROBNP in the last 8760 hours. HbA1C: No results for input(s): HGBA1C in the last 72 hours. CBG: No results for input(s): GLUCAP in the last 168 hours. Lipid Profile: No results for input(s): CHOL, HDL, LDLCALC, TRIG, CHOLHDL, LDLDIRECT in the last 72 hours. Thyroid Function Tests: Recent Labs    03/14/18 1028  TSH 2.582   Anemia Panel: No results for input(s): VITAMINB12, FOLATE, FERRITIN, TIBC, IRON, RETICCTPCT in the last 72 hours. Sepsis Labs: No results for input(s): PROCALCITON, LATICACIDVEN  in the last 168 hours.  Recent Results (from the past 240 hour(s))  Urine culture     Status: None   Collection Time: 03/05/18  6:48 PM  Result Value Ref Range Status   Specimen Description URINE, RANDOM  Final   Special Requests NONE  Final   Culture   Final    NO GROWTH Performed at [facility], [address], [place], [place] [address]    Report Status 03/07/2018 FINAL  Final  Culture, blood (routine x 2)     Status: None   Collection Time: 03/05/18  8:38 PM  Result Value Ref Range Status   Specimen Description BLOOD RIGHT HAND  Final   Special Requests   Final    BOTTLES DRAWN AEROBIC AND ANAEROBIC Blood Culture  results may not be optimal due to an inadequate volume of blood received in culture bottles Performed at [facility], [place] [address] [place], [place] [address]    Culture NO GROWTH 5 DAYS  Final   Report Status 03/10/2018 FINAL  Final  Culture, blood (routine x 2)     Status: None   Collection Time: 03/05/18  8:39 PM  Result Value Ref Range Status   Specimen Description BLOOD LEFT HAND  Final   Special Requests   Final    BOTTLES DRAWN AEROBIC ONLY Blood Culture results may not be optimal due to an inadequate volume of blood received in culture bottles Performed at [place] [place] [address] [place], [place] [address]    Culture NO GROWTH 5 DAYS  Final   Report Status 03/10/2018 FINAL  Final  MRSA PCR Screening     Status: None   Collection Time: 03/06/18  1:36 AM  Result Value Ref Range Status   MRSA by PCR NEGATIVE NEGATIVE Final    Comment:        The GeneXpert MRSA Assay (FDA approved for NASAL specimens only), is one component of a comprehensive MRSA colonization surveillance program. It is not intended to diagnose MRSA infection nor to guide or monitor treatment for MRSA infections. Performed at [facility], [place] [address], [place], [place] [address]   Respiratory Panel by PCR     Status: None   Collection Time: 03/06/18  1:58 AM  Result Value Ref Range Status   Adenovirus NOT DETECTED NOT DETECTED Final   Coronavirus 229E NOT DETECTED NOT DETECTED Final   Coronavirus HKU1 NOT DETECTED NOT DETECTED Final   Coronavirus NL63 NOT DETECTED NOT DETECTED Final   Coronavirus OC43 NOT DETECTED NOT DETECTED Final   Metapneumovirus NOT DETECTED NOT DETECTED Final   Rhinovirus / Enterovirus NOT DETECTED NOT DETECTED Final   Influenza A NOT DETECTED NOT DETECTED Final   Influenza B NOT DETECTED NOT DETECTED Final   Parainfluenza Virus 1 NOT DETECTED NOT DETECTED Final   Parainfluenza Virus 2 NOT DETECTED NOT DETECTED Final   Parainfluenza Virus 3 NOT  DETECTED NOT DETECTED Final   Parainfluenza Virus 4 NOT DETECTED NOT DETECTED Final   Respiratory Syncytial Virus NOT DETECTED NOT DETECTED Final   Bordetella pertussis NOT DETECTED NOT DETECTED Final   Chlamydophila pneumoniae NOT DETECTED NOT DETECTED Final   Mycoplasma pneumoniae NOT DETECTED NOT DETECTED Final    Comment: Performed at [facility], [place] [address], [place], [place] [address]  Expectorated sputum assessment w rflx to resp cult     Status: None   Collection Time: 03/09/18  5:30 PM  Result Value Ref Range Status   Specimen Description EXPECTORATED SPUTUM  Final   Special Requests NONE  Final   Sputum evaluation   Final    THIS SPECIMEN IS ACCEPTABLE FOR SPUTUM CULTURE Performed at [facility], [place] [address], [place], [place] [address]    Report Status 03/09/2018 FINAL  Final  Culture, respiratory     Status: None   Collection Time: 03/09/18  5:30 PM  Result Value Ref Range Status   Specimen Description   Final    EXPECTORATED SPUTUM Performed at [place] [address] [place], [place] [address]    Special Requests   Final    NONE Reflexed from [id number] Performed at [facility], [place] [address], [place], [place] [address]    Gram Stain   Final    ABUNDANT WBC PRESENT,BOTH PMN AND MONONUCLEAR FEW GRAM VARIABLE ROD FEW GRAM POSITIVE COCCI RARE YEAST Performed at [facility], [place] [address], [place], [place] [address]    Culture FEW Consistent with normal respiratory flora.  Final   Report Status 03/12/2018 FINAL  Final         Radiology Studies: Dg Chest Port 1 View  Result Date: 03/14/2018 CLINICAL DATA:  Increased shortness of breath EXAM: PORTABLE CHEST 1 VIEW COMPARISON:  03/05/2018 FINDINGS: Cardiac shadow remains enlarged. Postsurgical changes are again seen. Right-sided PICC line is noted in satisfactory position in the distal superior vena cava. Chronic blunting of left  costophrenic angle is noted. No focal infiltrate or effusion is seen. No bony abnormality is noted. IMPRESSION: Chronic left basilar changes. Right PICC line in satisfactory position. Electronically Signed   By: Inez Catalina M.D.   On: 03/14/2018 10:06        Scheduled Meds: . sodium chloride   Intravenous Once  . sodium chloride   Intravenous Once  . allopurinol  100 mg Oral Daily  . carvedilol  12.5 mg Oral BID WC  . feeding supplement (ENSURE ENLIVE)  237 mL Oral BID BM  . fluticasone furoate-vilanterol  1 puff Inhalation Daily  . ipratropium-albuterol  3 mL Nebulization TID  . lactulose  30 g Oral BID  . mouth rinse  15 mL Mouth Rinse BID  . multivitamin with minerals  1 tablet Oral Daily  . sodium chloride flush  3 mL Intravenous Q12H  . Tbo-Filgrastim  300 mcg Subcutaneous Q2000  . umeclidinium bromide  1 puff Inhalation Daily   Continuous Infusions: . sodium chloride 10 mL/hr at 03/15/18 0435  . dextrose 5 % and 0.45% NaCl       LOS: 10 days   Time spent= 35 mins    Nelsie Domino Arsenio Loader, MD [facility]  If 7PM-7AM, please contact night-coverage [email or web address] 03/15/2018, 1:14 PM

## 2018-03-15 NOTE — Progress Notes (Addendum)
IP PROGRESS NOTE  Subjective:   Mr. Albright has no new complaint.  He was up in the chair yesterday.  No bleeding. Objective: Vital signs in last 24 hours: Blood pressure 140/76, pulse 96, temperature 97.9 F (36.6 C), temperature source Oral, resp. rate (!) 25, height _0  (1.753 m), weight 210 lb (95.3 kg), SpO2 92 %.  Intake/Output from previous day: 01/27 0701 - 01/28 0700 In: 240 [P.O.:240] Out: -   Physical Exam:  HEENT: Thrush, ecchymoses at the lower lip Lungs: Mild wheeze, no respiratory distress Cardiac: Regular rhythm, distant heart sounds Abdomen: No hepatosplenomegaly, mild tenderness in the right upper abdomen Extremities: Trace edema at the foot bilaterally Skin: Multiple ecchymoses over the arms and legs, mild erythema at the tip of the right first toe Neurologic: Alert, follows commands, oriented to place and year   Lab Results: Recent Labs    03/05/18 1636 03/05/18 2221 03/06/18 0341  WBC 7.5  --  7.9  HGB 12.2*  --  11.6*  HCT 37.2*  --  36.1*  PLT 8* 8* 7*   Platelets 25,000 BMET Recent Labs    03/05/18 1636 03/05/18 1752 03/06/18 0341  NA 141  --  143  K 2.8*  --  3.2*  CL 104  --  102  CO2 24  --  26  GLUCOSE 106*  --  113*  BUN 14  --  15  CREATININE 1.45* 1.40* 1.58*  CALCIUM 9.7  --  10.5*   AST 151, ALT 76, bilirubin 2.0 Dg Chest 2 View  Result Date: 03/05/2018 CLINICAL DATA:  Short of breath.  Recent pneumonia EXAM: CHEST - 2 VIEW COMPARISON:  None. FINDINGS: Sternotomy wires overlie normal cardiac silhouette. Normal pulmonary vasculature. No effusion, infiltrate, or pneumothorax. No acute osseous abnormality. IMPRESSION: No acute cardiopulmonary process. Electronically Signed   By: Suzy Bouchard M.D.   On: 03/05/2018 15:11   Ct Angio Chest/abd/pel For Dissection W And/or W/wo  Addendum Date: 03/05/2018   ADDENDUM REPORT: 03/05/2018 20:17 ADDENDUM: Correction: There are several omissions in the body of the chest CT report due  to recent change from table mike to speech microphone. Under cardiovascular, it should state NO large central pulmonary embolus is noted. And likewise NO thoracic aortic aneurysm is identified. Under lungs/pleura common should state: As stated above, there is pulmonary consolidation in the superior segment of the left lower lobe abutting the pleura. A small right effusion and atelectasis is noted. Centrilobular emphysema is noted, upper lobe predominant. Findings discussed with Wyn Quaker. Electronically Signed   By: Ashley Royalty M.D.   On: 03/05/2018 20:17   Result Date: 03/05/2018 CLINICAL DATA:  Dyspnea on exertion. Recently diagnosed 12 5 discharge 64 EXAM: CT ANGIOGRAPHY CHEST, ABDOMEN AND PELVIS TECHNIQUE: Multidetector CT imaging through the chest, abdomen and pelvis was performed using the standard protocol during bolus administration of intravenous contrast. Multiplanar reconstructed images and MIPs were obtained and reviewed to evaluate the vascular anatomy. CONTRAST:  175m ISOVUE-370 IOPAMIDOL (ISOVUE-370) INJECTION 76% COMPARISON:  None. FINDINGS: CTA CHEST FINDINGS Cardiovascular: The unenhanced images through the chest demonstrate. Atherosclerosis of the thoracic aorta branch vessels are. Native main three-vessel coronary arteriosclerosis is noted with post CABG change present heart size is top normal effusion After IV contrast administration thoracic aortic aneurysm is identified. Linear streak artifacts are noted within the common carotid and subclavian veins emanating hyperdense contrast the adjacent left subclavian conclusive evidence dissection. Large central embolus is noted though the study is not tailored  toward assessment emboli Mediastinum/Nodes: Normal sized thyroid gland without dominant mass. Mildly enlarged left tracheobronchial lymph nodes measuring up to 12 mm short axis larger 2 cm short axis left hilar soft tissue density/lymph nodes noted. These may be reactive given what  appear to pulmonary consolidations in the superior segment of the left lower lobe compatible with pneumonia and/or atelectasis. Patent trachea and mainstem bronchi. The CT appearance of the esophagus is unremarkable. Lungs/Pleura: As stated above, there is pulmonary consolidation in superior segment of the lower lobe abutting the small right effusion and atelectasis. Centrilobular emphysema is, upper lobe. No pneumothorax. Musculoskeletal: No chest wall abnormality. No acute or significant osseous findings. Mild degenerative change along the thoracic spine. Median sternotomy sutures are in place. Review of the MIP images confirms the above findings. CTA ABDOMEN AND PELVIS FINDINGS VASCULAR Aorta: 4.5 cm infrarenal abdominal aortic aneurysm crescentic soft plaque along the posterior and left lateral wall series 8/92. Dissection. Celiac: Minimal atherosclerosis at the origin celiac axis patent branch vessels with atherosclerosis along the splenic artery dissection, aneurysm or significant stenosis. SMA: Patent with atherosclerotic origins. Occlusion or dissection aneurysm Renals: Single bilateral renal arteries atherosclerosis but fibromuscular dysplasia, stenosis, aneurysm or dissection identified. IMA: Patent Inflow: Patent without evidence of aneurysm, dissection, vasculitis or significant stenosis. Atherosclerosis common iliac arteries probable chronic calcified dissection involving the proximal left common iliac artery, series 8/233. Veins: No obvious venous abnormality within the limitations of this arterial phase study. Review of the MIP images confirms the above findings. NON-VASCULAR Hepatobiliary: No focal liver abnormality is seen. No gallstones, gallbladder wall thickening, or biliary dilatation. Pancreas: Mild ectasia of the pancreatic duct. No inflammation or dominant enhancing mass. Spleen: Normal Adrenals/Urinary Tract: Normal bilateral adrenal glands. Bilateral renal cysts without obstructive  uropathy. No solid enhancing masses are noted. The dependent layering bladder calculi are identified. Diverticulosis of the bladder is also noted at the dome of the bladder. Stomach/Bowel: Decompressed stomach with normal duodenal sweep and ligament of Treitz position. No acute bowel obstruction. Extensive colonic diverticulosis is noted along the descending sigmoid colon. Suggestion mild pericolonic inflammation along the mid sigmoid raises the possibility of a subtle sigmoid diverticulitis, series 8/250. Lymphatic: No chest, abdomen or pelvic adenopathy. Reproductive: Mildly enlarged prostate with peripheral zone calcifications. Other: No free air or free fluid. Musculoskeletal: Lumbar spondylosis with multilevel degenerative disc and endplate changes. Schmorl's node involving the superior endplate of L3. Review of the MIP images confirms the above findings. IMPRESSION: 1. 4.5 cm infrarenal abdominal aortic aneurysm with crescentic soft plaque along the posterior and left lateral wall. Patent branch vessels with atherosclerosis 2. No thoracic aortic aneurysm or dissection. No acute pulmonary embolus. 3. Left lower lobe pneumonia with with mediastinal and left hilar reactive adenopathy. Follow-up to assure resolution is suggested. 4. Bilateral renal cysts. 5. Descending and sigmoid colonic diverticulosis with subtle pericolonic fatty induration along the mid sigmoid raises the possibility of a mild uncomplicated sigmoid diverticulitis. Electronically Signed: By: Ashley Royalty M.D. On: 03/05/2018 19:57    Medications: I have reviewed the patient's current medications.  Assessment/Plan:  1.    Extensive stage small cell lung cancer  CT chest 03/05/2018-left hilar mass, small mediastinal lymph nodes, atelectasis versus consolidation versus mass in the superior segment of the left lower lobe  Bone marrow biopsy 03/07/2018-extensive involvement of the bone marrow with metastatic small cell carcinoma consistent  with a lung primary, small foci of non-small cell differentiation  Cycle 1 etoposide/carboplatin 03/09/2018, G-CSF started 03/12/2018 2.  Thrombocytopenia  secondary to #1 3.  COPD 4.  History of coronary artery disease 5.  CHF 6.  BPH 7.  Admission with pneumonia December 2019 8.  Mild renal insufficiency 9.  Elevated liver enzymes 10.  Coagulopathy 11.  History of mild elevation of the calcium level  Mr. Seago is more alert today.  He is now a day 7 following cycle 1 chemotherapy.  He remains weak.  The elevated liver enzymes are partially improved.  The platelet count is higher after receiving a transfusion yesterday.  The plan is to continue close follow-up of the white count and platelets.  The oxygen has been weaned.  He will likely require skilled nursing facility placement unless his performance status improves over the next few days.   Recommendations: 1.  Continue G-CSF 2.  Increase ambulation, skilled nursing placement as needed 3.  Transfuse platelets for a count of less than 5000 or bleeding     LOS: 1 day   [name], MD   03/06/2018, 8:55 AM

## 2018-03-16 ENCOUNTER — Encounter (HOSPITAL_COMMUNITY): Payer: Self-pay | Admitting: Oncology

## 2018-03-16 DIAGNOSIS — N2889 Other specified disorders of kidney and ureter: Secondary | ICD-10-CM

## 2018-03-16 DIAGNOSIS — C3492 Malignant neoplasm of unspecified part of left bronchus or lung: Secondary | ICD-10-CM

## 2018-03-16 LAB — CBC WITH DIFFERENTIAL/PLATELET
Abs Immature Granulocytes: 0.59 10*3/uL — ABNORMAL HIGH (ref 0.00–0.07)
Abs Immature Granulocytes: 0.18 10*3/uL — ABNORMAL HIGH (ref 0.00–0.07)
Basophils Absolute: 0 10*3/uL (ref 0.0–0.1)
Basophils Absolute: 0 10*3/uL (ref 0.0–0.1)
Basophils Relative: 0 %
Basophils Relative: 0 %
Eosinophils Absolute: 0 10*3/uL (ref 0.0–0.5)
Eosinophils Absolute: 0 10*3/uL (ref 0.0–0.5)
Eosinophils Relative: 1 %
Eosinophils Relative: 1 %
HCT: 27.4 % — ABNORMAL LOW (ref 39.0–52.0)
HCT: 24.7 % — ABNORMAL LOW (ref 39.0–52.0)
Hemoglobin: 8.4 g/dL — ABNORMAL LOW (ref 13.0–17.0)
Hemoglobin: 7.6 g/dL — ABNORMAL LOW (ref 13.0–17.0)
IMMATURE GRANULOCYTES: 14 %
Immature Granulocytes: 7 %
Lymphocytes Relative: 20 %
Lymphocytes Relative: 43 %
Lymphs Abs: 0.9 10*3/uL (ref 0.7–4.0)
Lymphs Abs: 1.1 10*3/uL (ref 0.7–4.0)
MCH: 30.1 pg (ref 26.0–34.0)
MCH: 29.6 pg (ref 26.0–34.0)
MCHC: 30.7 g/dL (ref 30.0–36.0)
MCHC: 30.8 g/dL (ref 30.0–36.0)
MCV: 98.2 fL (ref 80.0–100.0)
MCV: 96.1 fL (ref 80.0–100.0)
Monocytes Absolute: 0.1 10*3/uL (ref 0.1–1.0)
Monocytes Absolute: 0.1 10*3/uL (ref 0.1–1.0)
Monocytes Relative: 2 %
Monocytes Relative: 4 %
NEUTROS ABS: 1.1 10*3/uL — AB (ref 1.7–7.7)
NRBC: 0 % (ref 0.0–0.2)
Neutro Abs: 2.8 10*3/uL (ref 1.7–7.7)
Neutrophils Relative %: 63 %
Neutrophils Relative %: 45 %
Platelets: 7 10*3/uL — CL (ref 150–400)
Platelets: 18 10*3/uL — CL (ref 150–400)
RBC: 2.79 MIL/uL — ABNORMAL LOW (ref 4.22–5.81)
RBC: 2.57 MIL/uL — ABNORMAL LOW (ref 4.22–5.81)
RDW: 15.8 % — AB (ref 11.5–15.5)
RDW: 15.7 % — ABNORMAL HIGH (ref 11.5–15.5)
WBC: 4.4 10*3/uL (ref 4.0–10.5)
WBC: 2.6 10*3/uL — ABNORMAL LOW (ref 4.0–10.5)
nRBC: 0 % (ref 0.0–0.2)

## 2018-03-16 LAB — LACTATE DEHYDROGENASE: LDH: 396 U/L — ABNORMAL HIGH (ref 98–192)

## 2018-03-16 LAB — BASIC METABOLIC PANEL
Anion gap: 3 — ABNORMAL LOW (ref 5–15)
BUN: 37 mg/dL — ABNORMAL HIGH (ref 8–23)
CO2: 27 mmol/L (ref 22–32)
Calcium: 8.8 mg/dL — ABNORMAL LOW (ref 8.9–10.3)
Chloride: 117 mmol/L — ABNORMAL HIGH (ref 98–111)
Creatinine, Ser: 1.22 mg/dL (ref 0.61–1.24)
GFR calc Af Amer: >60 mL/min (ref >60)
GFR calc non Af Amer: 55 mL/min — ABNORMAL LOW (ref >60)
Glucose, Bld: 134 mg/dL — ABNORMAL HIGH (ref 70–99)
Potassium: 3.8 mmol/L (ref 3.5–5.1)
Sodium: 147 mmol/L — ABNORMAL HIGH (ref 135–145)

## 2018-03-16 LAB — MAGNESIUM: Magnesium: 1.9 mg/dL (ref 1.7–2.4)

## 2018-03-16 MED ORDER — SODIUM CHLORIDE 0.9% IV SOLUTION: Freq: Once | INTRAVENOUS | Status: DC

## 2018-03-16 NOTE — Progress Notes (Signed)
Physical Therapy Treatment Patient Details Name: Joseph Berg MRN: [id number] DOB: [birth year] Today's Date: 03/16/2018    History of Present Illness Pt is an 85 y.o. male admitted 03/05/18 with progressive SOB, found to be hypoxic on room air by HHPT. Worked up for probable pneumonia; thrombocytopenia of unclear etiology, awaiting bone marrow biopsy. CT noted diverticulitis. PMH includes CHF, CAD, AAA, recent admission 01/2018 with PNA.    PT Comments    Pt progressing slowly and will need ST Rehab at SNF prior to safely going back home. Assuisted OOB to amb a limited distance required + 2 assist.  Overall transfer level: Needs assistance Equipment used: Rolling walker (2 wheeled) Transfers: Sit to/from Stand Sit to Stand: +2 safety/equipment;+2 physical assistance;Mod assist Gait was limited bt weakness and dyspnea.  Ambulation/Gait assistance: Mod assist;+2 physical assistance;+2 safety/equipment Gait Distance (Feet): 12 Feet Assistive device: Rolling walker (2 wheeled) Gait Pattern/deviations: Step-to pattern;Step-through pattern;Decreased step length - right;Decreased step length - left Gait velocity: Decreased   General Gait Details: very limited distance and very short, shuffled steps.  Limited distance due to dyspnea.  HR 90 and RA decreased to 84%.  Required 2 lts to achieve sats > 90%.  SATURATION QUALIFICATIONS: (This note is used to comply with regulatory documentation for home oxygen)  Patient Saturations on Room Air at Rest = 90%  Patient Saturations on Room Air while Ambulating 12 feet = 84%  Patient Saturations on 2 Liters of oxygen while Ambulating = 90%  Please briefly explain why patient needs home oxygen:  Pt does require supplemental oxygen to achieve therapeutic levels.          General transfer comment: from elevated bed + 2 side by side assist    Follow Up Recommendations  SNF     Equipment Recommendations  None recommended by PT     Recommendations for Other Services       Precautions / Restrictions Precautions Precautions: Fall Precaution Comments: monitor sats  Restrictions Weight Bearing Restrictions: No    Mobility  Bed Mobility Overal bed mobility: Needs Assistance Bed Mobility: Supine to Sit     Supine to sit: Mod assist;+2 for physical assistance;+2 for safety/equipment     General bed mobility comments: much assist from supine to EOB  Transfers Overall transfer level: Needs assistance Equipment used: Rolling walker (2 wheeled) Transfers: Sit to/from Stand Sit to Stand: +2 safety/equipment;+2 physical assistance;Mod assist         General transfer comment: from elevated bed + 2 side by side assist   Ambulation/Gait Ambulation/Gait assistance: Mod assist;+2 physical assistance;+2 safety/equipment Gait Distance (Feet): 12 Feet Assistive device: Rolling walker (2 wheeled) Gait Pattern/deviations: Step-to pattern;Step-through pattern;Decreased step length - right;Decreased step length - left Gait velocity: Decreased   General Gait Details: very limited distance and very short, shuffled steps.  Limited distance due to dyspnea.  HR 90 and RA decreased to 84%.  Required 2 lts to achieve sats > 90%.   Stairs             Wheelchair Mobility    Modified Rankin (Stroke Patients Only)       Balance                                            Cognition Arousal/Alertness: Awake/alert Behavior During Therapy: WFL for tasks assessed/performed Overall Cognitive Status: Within Functional Limits for tasks  assessed                                        Exercises      General Comments        Pertinent Vitals/Pain Pain Assessment: Faces Faces Pain Scale: Hurts a little bit Pain Location: "everywhere" esp back and neck Pain Descriptors / Indicators: Grimacing Pain Intervention(s): Repositioned    Home Living                       Prior Function            PT Goals (current goals can now be found in the care plan section) Progress towards PT goals: Progressing toward goals    Frequency    Min 3X/week      PT Plan Discharge plan needs to be updated    Co-evaluation              AM-PAC PT "6 Clicks" Mobility   Outcome Measure  Help needed turning from your back to your side while in a flat bed without using bedrails?: A Lot Help needed moving from lying on your back to sitting on the side of a flat bed without using bedrails?: A Lot Help needed moving to and from a bed to a chair (including a wheelchair)?: A Lot Help needed standing up from a chair using your arms (e.g., wheelchair or bedside chair)?: A Lot Help needed to walk in hospital room?: A Lot Help needed climbing 3-5 steps with a railing? : Total 6 Click Score: 11    End of Session Equipment Utilized During Treatment: Oxygen Activity Tolerance: Patient limited by fatigue Patient left: in chair;with call bell/phone within reach;with family/visitor present Nurse Communication: Mobility status(RN in room during session) PT Visit Diagnosis: Other abnormalities of gait and mobility (R26.89)     Time: 0938-1829 PT Time Calculation (min) (ACUTE ONLY): 20 min  Charges:  $Gait Training: 8-22 mins                     Rica Koyanagi  PTA Acute  Rehabilitation Services Pager      [phone] Office      [phone]

## 2018-03-16 NOTE — Progress Notes (Signed)
PROGRESS NOTE    Joseph Berg  XQJ:[id number] DOB: 29-Nov-1933 DOA: 03/05/2018 PCP: [name], MD   Brief Narrative:  85 year old with history of COPD, chronic systolic congestive heart failure, coronary artery disease, thrombocytopenia initially admitted to [facility] for left lower lobe pneumonia with severe thrombocytopenia.  Subsequently underwent bone marrow biopsy which showed concerns for small cell carcinoma-likely lung with bone marrow involvement.  Patient was transferred to [facility] long for chemotherapy.  He was started on carboplatin and etoposide on 1/31   Assessment & Plan:   Principal Problem:   PNA (pneumonia) Active Problems:   CAD (coronary artery disease) of artery bypass graft   Chronic systolic congestive heart failure (HCC)   AAA (abdominal aortic aneurysm) without rupture (HCC)   Acute respiratory failure with hypoxia (HCC)   Diverticulitis possible   Thrombocytopenia (HCC)   Small cell carcinoma (HCC)  Small cell carcinoma of the lung Mild acute respiratory distress with hypoxia, still requiring 2 L nasal cannula  -Continue scheduled and as needed bronchodilators.  Incentive spirometry and flutter valve. -Mentation improved, if necessary we will get CT/MRI brain - Chest x-ray this morning shows chronic left basilar changes, right PICC line satisfactory. -Chemotherapy per oncology team -TSH within normal limits, ammonia slightly elevated at 57  Severe thrombocytopenia - Slightly low today.  Platelets are 18.  No obvious evidence of bleeding.  Oncology following. -Total platelet transfusion-5 units  Elevated ammonia level, 57 -Lactulose as needed-titrate to 3 bowel movements daily  Left lower lobe pneumonia -Streptococcal antigen positive, completed 7 days of Rocephin -Respiratory viral panel negative.  Hold off on further antibiotics  Chronic systolic congestive heart failure -Continue Coreg, Lasix on hold.  Stable  Coronary artery  disease status post CABG -Denies any chest pain, appears to be stable.    Obstructive sleep apnea -Unfortunately patient is off-and-on noncompliant with his CPAP.  Acute kidney injury, improved -Improved, trended down to 1.22  Home health orders have been placed as this is what patient wants but I highly recommended patient that he would benefit from skilled nursing facility as he requires significant amount of assistance.  DVT prophylaxis: Out of bed to chair Code Status: DNR Family Communication: Wife at bedside Disposition Plan: Per oncology maintain inpatient stay until platelet counts have improved.  Will discharge once cleared by oncology. Consultants:   Oncology  Procedures:   Bone marrow biopsy 1/29  Antimicrobials:   Completed course of Rocephin   Subjective: From breathing standpoint patient feels slightly better but continues to be very weak requiring significant amount of assistance.  Patient is still adamant about going home with home health but have explained them that skilled nursing facility will be much beneficial.  No obvious signs of bleeding.  Review of Systems Otherwise negative except as per HPI, including: General = no fevers, chills, dizziness, malaise, fatigue HEENT/EYES = negative for pain, redness, loss of vision, double vision, blurred vision, loss of hearing, sore throat, hoarseness, dysphagia Cardiovascular= negative for chest pain, palpitation, murmurs, lower extremity swelling Respiratory/lungs= negative for shortness of breath, cough, hemoptysis, wheezing, mucus production Gastrointestinal= negative for nausea, vomiting,, abdominal pain, melena, hematemesis Genitourinary= negative for Dysuria, Hematuria, Change in Urinary Frequency MSK = Negative for arthralgia, myalgias, Back Pain, Joint swelling  Neurology= Negative for headache, seizures, numbness, tingling  Psychiatry= Negative for anxiety, depression, suicidal and homocidal  ideation Allergy/Immunology= Medication/Food allergy as listed  Skin= Negative for Rash, lesions, ulcers, itching  Objective: Vitals:   03/15/18 2105 03/15/18 2113  03/16/18 0526 03/16/18 0840  BP:  132/71 128/75   Pulse:  76 74   Resp:  17 17   Temp:  99.1 F (37.3 C) 98.7 F (37.1 C)   TempSrc:  Oral Oral   SpO2: 96% 96% 95% 93%  Weight:      Height:        Intake/Output Summary (Last 24 hours) at 03/16/2018 1207 Last data filed at 03/16/2018 0940 Gross per 24 hour  Intake 2090.12 ml  Output 400 ml  Net 1690.12 ml   Filed Weights   03/05/18 1610 03/08/18 1931  Weight: 95.3 kg 94.8 kg    Examination:  Constitutional: NAD, calm, comfortable, on 2 L nasal cannula Eyes: PERRL, lids and conjunctivae normal ENMT: Mucous membranes are moist. Posterior pharynx clear of any exudate or lesions.Normal dentition.  Neck: normal, supple, no masses, no thyromegaly Respiratory: Diminished breath sounds especially at the bases Cardiovascular: Regular rate and rhythm, no murmurs / rubs / gallops. No extremity edema. 2+ pedal pulses. No carotid bruits.  Abdomen: no tenderness, no masses palpated. No hepatosplenomegaly. Bowel sounds positive.  Musculoskeletal: no clubbing / cyanosis. No joint deformity upper and lower extremities. Good ROM, no contractures. Normal muscle tone.  Skin: no rashes, lesions, ulcers. No induration Neurologic: CN 2-12 grossly intact. Sensation intact, DTR normal. Strength 4/5 in all 4.  Psychiatric: Normal judgment and insight. Alert and oriented x 3. Normal mood.    Data Reviewed:   CBC: Recent Labs  Lab 03/11/18 0405 03/12/18 4462 03/13/18 8638 03/14/18 0635 03/15/18 0511 03/16/18 0539  WBC 4.4 4.1 3.6* 4.4 3.4* 2.6*  NEUTROABS 3.3 3.0 2.7 2.8  --  1.1*  HGB 7.6* 9.0* 8.3* 8.4* 8.1* 7.6*  HCT 25.3* 29.0* 26.9* 27.4* 25.8* 24.7*  MCV 99.2 98.6 98.2 98.2 97.4 96.1  PLT 5* <5* 10* 7* 25* 18*   Basic Metabolic Panel: Recent Labs  Lab 03/12/18 0638  03/13/18 0638 03/14/18 0635 03/15/18 0511 03/16/18 0539  NA 145 146* 148* 150* 147*  K 4.3 4.3 4.4 4.1 3.8  CL 111 114* 116* 119* 117*  CO2 27 26 27 28 27   GLUCOSE 113* 106* 105* 120* 134*  BUN 58* 55* 53* 48* 37*  CREATININE 2.04* 1.73* 1.51* 1.40* 1.22  CALCIUM 9.1 9.1 9.2 9.1 8.8*  MG  --   --   --  2.1 1.9   GFR: Estimated Creatinine Clearance: 53 mL/min (by C-G formula based on SCr of 1.22 mg/dL). Liver Function Tests: Recent Labs  Lab 03/11/18 0405 03/12/18 1771 03/13/18 1657 03/14/18 0635 03/15/18 0511  AST 192* 201* 201* 185* 151*  ALT 85* 89* 97* 87* 76*  ALKPHOS 185* 251* 264* 284* 270*  BILITOT 2.9* 2.9* 2.2* 2.0* 2.0*  PROT 4.4* 5.0* 4.8* 4.9* 4.9*  ALBUMIN 2.2* 2.4* 2.4* 2.4* 2.5*   No results for input(s): LIPASE, AMYLASE in the last 168 hours. Recent Labs  Lab 03/14/18 1028  AMMONIA 57*   Coagulation Profile: Recent Labs  Lab 03/10/18 0815 03/12/18 0638  INR 1.33 1.35   Cardiac Enzymes: No results for input(s): CKTOTAL, CKMB, CKMBINDEX, TROPONINI in the last 168 hours. BNP (last 3 results) No results for input(s): PROBNP in the last 8760 hours. HbA1C: No results for input(s): HGBA1C in the last 72 hours. CBG: No results for input(s): GLUCAP in the last 168 hours. Lipid Profile: No results for input(s): CHOL, HDL, LDLCALC, TRIG, CHOLHDL, LDLDIRECT in the last 72 hours. Thyroid Function Tests: Recent Labs    03/14/18  1028  TSH 2.582   Anemia Panel: No results for input(s): VITAMINB12, FOLATE, FERRITIN, TIBC, IRON, RETICCTPCT in the last 72 hours. Sepsis Labs: No results for input(s): PROCALCITON, LATICACIDVEN in the last 168 hours.  Recent Results (from the past 240 hour(s))  Expectorated sputum assessment w rflx to resp cult     Status: None   Collection Time: 03/09/18  5:30 PM  Result Value Ref Range Status   Specimen Description EXPECTORATED SPUTUM  Final   Special Requests NONE  Final   Sputum evaluation   Final    THIS  SPECIMEN IS ACCEPTABLE FOR SPUTUM CULTURE Performed at [facility], [place] [address] [place], [place] [address]    Report Status 03/09/2018 FINAL  Final  Culture, respiratory     Status: None   Collection Time: 03/09/18  5:30 PM  Result Value Ref Range Status   Specimen Description   Final    EXPECTORATED SPUTUM Performed at [place] [address], [place], [place] [address]    Special Requests   Final    NONE Reflexed from [id number] Performed at [facility], [place] [address], [place], [place] [address]    Gram Stain   Final    ABUNDANT WBC PRESENT,BOTH PMN AND MONONUCLEAR FEW GRAM VARIABLE ROD FEW GRAM POSITIVE COCCI RARE YEAST Performed at [facility], [place] [address] [place],  [address]    Culture FEW Consistent with normal respiratory flora.  Final   Report Status 03/12/2018 FINAL  Final         Radiology Studies: No results found.      Scheduled Meds: . sodium chloride   Intravenous Once  . sodium chloride   Intravenous Once  . sodium chloride   Intravenous Once  . carvedilol  12.5 mg Oral BID WC  . feeding supplement (ENSURE ENLIVE)  237 mL Oral BID BM  . fluticasone furoate-vilanterol  1 puff Inhalation Daily  . ipratropium-albuterol  3 mL Nebulization TID  . lactulose  30 g Oral BID  . mouth rinse  15 mL Mouth Rinse BID  . multivitamin with minerals  1 tablet Oral Daily  . sodium chloride flush  3 mL Intravenous Q12H  . Tbo-Filgrastim  300 mcg Subcutaneous Q2000  . umeclidinium bromide  1 puff Inhalation Daily   Continuous Infusions: . sodium chloride 50 mL/hr at 03/16/18 0334     LOS: 11 days   Time spent= 25 mins    Ankit Arsenio Loader, MD [facility]  If 7PM-7AM, please contact night-coverage [email or web address] 03/16/2018, 12:07 PM

## 2018-03-16 NOTE — Clinical Social Work Note (Signed)
Clinical Social Work Assessment  Patient Details  Name: Joseph Berg MRN: [id number] Date of Birth: Jul 01, 1933  Date of referral:  03/16/18               Reason for consult:  Facility Placement, Discharge Planning                Permission sought to share information with:  Family Supports Permission granted to share information::     Name::     Dylin Breeden  Agency::     Relationship::  spouse  Contact Information:  [phone]  Housing/Transportation Living arrangements for the past 2 months:  Single Family Home Source of Information:  Patient, Spouse Patient Interpreter Needed:  None Criminal Activity/Legal Involvement Pertinent to Current Situation/Hospitalization:  No - Comment as needed Significant Relationships:  Spouse, Friend Lives with:  Spouse Do you feel safe going back to the place where you live?  Yes Need for family participation in patient care:  Yes (Comment)  Care giving concerns:  Patients wife at bedside. Patient was extremely sleepy during assessment but still wanted to participate in CSW assessment    Social Worker assessment / plan:  CSW spoke to both patient and spouse about discharge plan. Patient stated he does not want to go to a rehab facility but would rather go home. Patient spouse agreed with patient stating she will be at home to care for him. CSW asked if they had other support beside each other. Spouse stated they have friends that will be able to help her with patients care. Family stated they would prefer to go home with home health. CSW made RNCM aware of family's decision   Employment status:  Retired Nurse, adult PT Recommendations:  [place] / Referral to community resources:  [place]  Patient/Family's Response to care:  Family supportive of patient and his needs. Family requested to do patients HCPOA while patient was still admitted   Patient/Family's  Understanding of and Emotional Response to Diagnosis, Current Treatment, and Prognosis:  Patient wanting to go home with home health  Emotional Assessment Appearance:  Appears stated age Attitude/Demeanor/Rapport:  Engaged Affect (typically observed):  Accepting, Pleasant Orientation:  Oriented to Self, Oriented to  Time, Oriented to Place, Oriented to Situation Alcohol / Substance use:  Not Applicable Psych involvement (Current and /or in the community):  No (Comment)  Discharge Needs  Concerns to be addressed:  Care Coordination Readmission within the last 30 days:  No Current discharge risk:  Dependent with Mobility Barriers to Discharge:  Continued Medical Work up   [employer], [place] 03/16/2018, 3:49 PM

## 2018-03-16 NOTE — Progress Notes (Signed)
IP PROGRESS NOTE  Subjective:   Joseph Berg denies dyspnea and bleeding.  He is more alert today. Objective: Vital signs in last 24 hours: Blood pressure 140/76, pulse 96, temperature 97.9 F (36.6 C), temperature source Oral, resp. rate (!) 25, height 5' 9"  (1.753 m), weight 210 lb (95.3 kg), SpO2 92 %.  Intake/Output from previous day: 01/27 0701 - 01/28 0700 In: 240 [P.O.:240] Out: -   Physical Exam:  HEENT: No thrush, resolving ecchymoses at the lower lip Lungs: Clear anteriorly, no respiratory distress Cardiac: Regular rhythm, distant heart sounds Abdomen: No hepatosplenomegaly, nontender Extremities: Trace edema at the foot bilaterally Skin: Multiple ecchymoses over the arms and legs appear to be resolving Neurologic: Alert, follows commands, oriented to place and year   Lab Results: Recent Labs    03/05/18 1636 03/05/18 2221 03/06/18 0341  WBC 7.5  --  7.9  HGB 12.2*  --  11.6*  HCT 37.2*  --  36.1*  PLT 8* 8* 7*   Platelets 18, ANC 1.1 BMET Recent Labs    03/05/18 1636 03/05/18 1752 03/06/18 0341  NA 141  --  143  K 2.8*  --  3.2*  CL 104  --  102  CO2 24  --  26  GLUCOSE 106*  --  113*  BUN 14  --  15  CREATININE 1.45* 1.40* 1.58*  CALCIUM 9.7  --  10.5*  Creatinine 1.22, LDH 396 Dg Chest 2 View  Result Date: 03/05/2018 CLINICAL DATA:  Short of breath.  Recent pneumonia EXAM: CHEST - 2 VIEW COMPARISON:  None. FINDINGS: Sternotomy wires overlie normal cardiac silhouette. Normal pulmonary vasculature. No effusion, infiltrate, or pneumothorax. No acute osseous abnormality. IMPRESSION: No acute cardiopulmonary process. Electronically Signed   By: Suzy Bouchard M.D.   On: 03/05/2018 15:11   Ct Angio Chest/abd/pel For Dissection W And/or W/wo  Addendum Date: 03/05/2018   ADDENDUM REPORT: 03/05/2018 20:17 ADDENDUM: Correction: There are several omissions in the body of the chest CT report due to recent change from table mike to speech microphone. Under  cardiovascular, it should state NO large central pulmonary embolus is noted. And likewise NO thoracic aortic aneurysm is identified. Under lungs/pleura common should state: As stated above, there is pulmonary consolidation in the superior segment of the left lower lobe abutting the pleura. A small right effusion and atelectasis is noted. Centrilobular emphysema is noted, upper lobe predominant. Findings discussed with Joseph Berg. Electronically Signed   By: Ashley Royalty M.D.   On: 03/05/2018 20:17   Result Date: 03/05/2018 CLINICAL DATA:  Dyspnea on exertion. Recently diagnosed 12 5 discharge 66 EXAM: CT ANGIOGRAPHY CHEST, ABDOMEN AND PELVIS TECHNIQUE: Multidetector CT imaging through the chest, abdomen and pelvis was performed using the standard protocol during bolus administration of intravenous contrast. Multiplanar reconstructed images and MIPs were obtained and reviewed to evaluate the vascular anatomy. CONTRAST:  12m ISOVUE-370 IOPAMIDOL (ISOVUE-370) INJECTION 76% COMPARISON:  None. FINDINGS: CTA CHEST FINDINGS Cardiovascular: The unenhanced images through the chest demonstrate. Atherosclerosis of the thoracic aorta branch vessels are. Native main three-vessel coronary arteriosclerosis is noted with post CABG change present heart size is top normal effusion After IV contrast administration thoracic aortic aneurysm is identified. Linear streak artifacts are noted within the common carotid and subclavian veins emanating hyperdense contrast the adjacent left subclavian conclusive evidence dissection. Large central embolus is noted though the study is not tailored toward assessment emboli Mediastinum/Nodes: Normal sized thyroid gland without dominant mass. Mildly enlarged left tracheobronchial lymph  nodes measuring up to 12 mm short axis larger 2 cm short axis left hilar soft tissue density/lymph nodes noted. These may be reactive given what appear to pulmonary consolidations in the superior segment of  the left lower lobe compatible with pneumonia and/or atelectasis. Patent trachea and mainstem bronchi. The CT appearance of the esophagus is unremarkable. Lungs/Pleura: As stated above, there is pulmonary consolidation in superior segment of the lower lobe abutting the small right effusion and atelectasis. Centrilobular emphysema is, upper lobe. No pneumothorax. Musculoskeletal: No chest wall abnormality. No acute or significant osseous findings. Mild degenerative change along the thoracic spine. Median sternotomy sutures are in place. Review of the MIP images confirms the above findings. CTA ABDOMEN AND PELVIS FINDINGS VASCULAR Aorta: 4.5 cm infrarenal abdominal aortic aneurysm crescentic soft plaque along the posterior and left lateral wall series 8/92. Dissection. Celiac: Minimal atherosclerosis at the origin celiac axis patent branch vessels with atherosclerosis along the splenic artery dissection, aneurysm or significant stenosis. SMA: Patent with atherosclerotic origins. Occlusion or dissection aneurysm Renals: Single bilateral renal arteries atherosclerosis but fibromuscular dysplasia, stenosis, aneurysm or dissection identified. IMA: Patent Inflow: Patent without evidence of aneurysm, dissection, vasculitis or significant stenosis. Atherosclerosis common iliac arteries probable chronic calcified dissection involving the proximal left common iliac artery, series 8/233. Veins: No obvious venous abnormality within the limitations of this arterial phase study. Review of the MIP images confirms the above findings. NON-VASCULAR Hepatobiliary: No focal liver abnormality is seen. No gallstones, gallbladder wall thickening, or biliary dilatation. Pancreas: Mild ectasia of the pancreatic duct. No inflammation or dominant enhancing mass. Spleen: Normal Adrenals/Urinary Tract: Normal bilateral adrenal glands. Bilateral renal cysts without obstructive uropathy. No solid enhancing masses are noted. The dependent layering  bladder calculi are identified. Diverticulosis of the bladder is also noted at the dome of the bladder. Stomach/Bowel: Decompressed stomach with normal duodenal sweep and ligament of Treitz position. No acute bowel obstruction. Extensive colonic diverticulosis is noted along the descending sigmoid colon. Suggestion mild pericolonic inflammation along the mid sigmoid raises the possibility of a subtle sigmoid diverticulitis, series 8/250. Lymphatic: No chest, abdomen or pelvic adenopathy. Reproductive: Mildly enlarged prostate with peripheral zone calcifications. Other: No free air or free fluid. Musculoskeletal: Lumbar spondylosis with multilevel degenerative disc and endplate changes. Schmorl's node involving the superior endplate of L3. Review of the MIP images confirms the above findings. IMPRESSION: 1. 4.5 cm infrarenal abdominal aortic aneurysm with crescentic soft plaque along the posterior and left lateral wall. Patent branch vessels with atherosclerosis 2. No thoracic aortic aneurysm or dissection. No acute pulmonary embolus. 3. Left lower lobe pneumonia with with mediastinal and left hilar reactive adenopathy. Follow-up to assure resolution is suggested. 4. Bilateral renal cysts. 5. Descending and sigmoid colonic diverticulosis with subtle pericolonic fatty induration along the mid sigmoid raises the possibility of a mild uncomplicated sigmoid diverticulitis. Electronically Signed: By: Ashley Royalty M.D. On: 03/05/2018 19:57    Medications: I have reviewed the patient's current medications.  Assessment/Plan:  1.    Extensive stage small cell lung cancer  CT chest 03/05/2018-left hilar mass, small mediastinal lymph nodes, atelectasis versus consolidation versus mass in the superior segment of the left lower lobe  Bone marrow biopsy 03/07/2018-extensive involvement of the bone marrow with metastatic small cell carcinoma consistent with a lung primary, small foci of non-small cell  differentiation  Cycle 1 etoposide/carboplatin 03/09/2018, G-CSF started 03/12/2018 2.  Thrombocytopenia secondary to #1 3.  COPD 4.  History of coronary artery disease 5.  CHF  6.  BPH 7.  Admission with pneumonia December 2019 8.  Mild renal insufficiency 9.  Elevated liver enzymes 10.  Coagulopathy 11.  History of mild elevation of the calcium level  Joseph Berg appears more alert today.  The platelet count has been higher for the past 2 days.  He does not need a platelet transfusion today.  The white count is falling at day 8 following cycle 1 chemotherapy.  He appears more alert.  The oxygen has been weaned to 2 L.  I discussed disposition plans with [name] and his wife.  He will need to increase his ambulation in order to return home.  Otherwise he will need skilled nursing facility placement.  He will be stable for discharge when there is improvement in the platelets and neutrophil count.   Recommendations: 1.  Continue G-CSF, follow daily CBC with differential 2.  Increase ambulation, physical therapy 3.  Transfuse platelets for a count of less than 5000 or bleeding 4.  Oncology will follow him daily     LOS: 1 day   [name], MD   03/06/2018, 8:55 AM

## 2018-03-17 DIAGNOSIS — D6189 Other specified aplastic anemias and other bone marrow failure syndromes: Secondary | ICD-10-CM

## 2018-03-17 LAB — HEPATIC FUNCTION PANEL
ALT: 70 U/L — ABNORMAL HIGH (ref 0–44)
AST: 91 U/L — ABNORMAL HIGH (ref 15–41)
Albumin: 2.5 g/dL — ABNORMAL LOW (ref 3.5–5.0)
Alkaline Phosphatase: 216 U/L — ABNORMAL HIGH (ref 38–126)
BILIRUBIN TOTAL: 1.7 mg/dL — AB (ref 0.3–1.2)
Bilirubin, Direct: 0.8 mg/dL — ABNORMAL HIGH (ref 0.0–0.2)
Indirect Bilirubin: 0.9 mg/dL (ref 0.3–0.9)
Total Protein: 4.7 g/dL — ABNORMAL LOW (ref 6.5–8.1)

## 2018-03-17 LAB — BASIC METABOLIC PANEL
Anion gap: 4 — ABNORMAL LOW (ref 5–15)
BUN: 26 mg/dL — ABNORMAL HIGH (ref 8–23)
CHLORIDE: 115 mmol/L — AB (ref 98–111)
CO2: 26 mmol/L (ref 22–32)
CREATININE: 1.05 mg/dL (ref 0.61–1.24)
Calcium: 8.7 mg/dL — ABNORMAL LOW (ref 8.9–10.3)
GFR calc Af Amer: >60 mL/min (ref >60)
GFR calc non Af Amer: >60 mL/min (ref >60)
Glucose, Bld: 115 mg/dL — ABNORMAL HIGH (ref 70–99)
Potassium: 3.6 mmol/L (ref 3.5–5.1)
Sodium: 145 mmol/L (ref 135–145)

## 2018-03-17 LAB — CBC WITH DIFFERENTIAL/PLATELET
Abs Immature Granulocytes: 0.01 10*3/uL (ref 0.00–0.07)
Basophils Absolute: 0 10*3/uL (ref 0.0–0.1)
Basophils Relative: 0 %
Eosinophils Absolute: 0 10*3/uL (ref 0.0–0.5)
Eosinophils Relative: 2 %
HCT: 22 % — ABNORMAL LOW (ref 39.0–52.0)
Hemoglobin: 6.7 g/dL — CL (ref 13.0–17.0)
Immature Granulocytes: 1 %
Lymphocytes Relative: 61 %
Lymphs Abs: 1.1 10*3/uL (ref 0.7–4.0)
MCH: 29.8 pg (ref 26.0–34.0)
MCHC: 30.5 g/dL (ref 30.0–36.0)
MCV: 97.8 fL (ref 80.0–100.0)
MONO ABS: 0.1 10*3/uL (ref 0.1–1.0)
Monocytes Relative: 7 %
NEUTROS ABS: 0.5 10*3/uL — AB (ref 1.7–7.7)
Neutrophils Relative %: 29 %
Platelets: 13 10*3/uL — CL (ref 150–400)
RBC: 2.25 MIL/uL — ABNORMAL LOW (ref 4.22–5.81)
RDW: 15.4 % (ref 11.5–15.5)
WBC: 1.8 10*3/uL — ABNORMAL LOW (ref 4.0–10.5)
nRBC: 0 % (ref 0.0–0.2)

## 2018-03-17 LAB — PREPARE RBC (CROSSMATCH)

## 2018-03-17 LAB — MAGNESIUM: Magnesium: 1.9 mg/dL (ref 1.7–2.4)

## 2018-03-17 MED ORDER — LACTULOSE 10 GM/15ML PO SOLN
10.0000 g | Freq: Every day | ORAL | Status: DC
  Administered 2018-03-17 – 2018-03-18 (×2): 10 g via ORAL
  Filled 2018-03-17 (×3): qty 15

## 2018-03-17 MED ORDER — SODIUM CHLORIDE 0.9% IV SOLUTION
Freq: Once | INTRAVENOUS | Status: AC
  Administered 2018-03-17: 09:00:00 via INTRAVENOUS

## 2018-03-17 NOTE — Progress Notes (Signed)
PROGRESS NOTE    Joseph Berg  POE:[id number] DOB: January 07, 1934 DOA: 03/05/2018 PCP: [name], MD   Brief Narrative:  85 year old with history of COPD, chronic systolic congestive heart failure, coronary artery disease, thrombocytopenia initially admitted to [facility] for left lower lobe pneumonia with severe thrombocytopenia.  Subsequently underwent bone marrow biopsy which showed concerns for small cell carcinoma-likely lung with bone marrow involvement.  Patient was transferred to [facility] for chemotherapy.  He was started on carboplatin and etoposide on 1/31   Assessment & Plan:   Principal Problem:   PNA (pneumonia) Active Problems:   CAD (coronary artery disease) of artery bypass graft   Chronic systolic congestive heart failure (HCC)   AAA (abdominal aortic aneurysm) without rupture (HCC)   Acute respiratory failure with hypoxia (HCC)   Diverticulitis possible   Thrombocytopenia (HCC)   Small cell carcinoma (HCC)  Small cell carcinoma of the lung Mild acute respiratory distress with hypoxia, Requires 2-3 L nasal cannula -Continue scheduled and as needed bronchodilators.  Incentive spirometry and flutter valve. -Mentation improved, if necessary we will get CT/MRI brain - Chest x-ray this morning shows chronic left basilar changes, right PICC line satisfactory. -Chemotherapy per oncology team -TSH within normal limits, ammonia slightly elevated at 57  Severe thrombocytopenia and anemia - Blood count is down to 6.7.  Platelets are down to 13.  Oncology following -Planning on transfusing 2 units of PRBC today. -Total platelet transfusion-5 units  Elevated ammonia level, 57 -Lactulose as needed-titrate to 3 bowel movements daily  Left lower lobe pneumonia -Streptococcal antigen positive, completed 7 days of Rocephin -Respiratory viral panel negative.  Hold off on further antibiotics  Chronic systolic congestive heart failure -Continue Coreg, Lasix  on hold.  Stable  Coronary artery disease status post CABG -Denies any chest pain, appears to be stable.    Obstructive sleep apnea -Unfortunately patient is off-and-on noncompliant with his CPAP.  Acute kidney injury, improved -Improved, trended down to 1.05   DVT prophylaxis: Out of bed to chair Code Status: DNR Family Communication: Wife and his niece at bedside Disposition Plan: Maintain inpatient stay for blood transfusion.  Will discharge once cleared by oncology  Consultants:   Oncology  Procedures:   Bone marrow biopsy 1/29  Antimicrobials:   Completed course of Rocephin   Subjective: Still has some exertional shortness of breath.  Slowly working with physical therapy but requiring quite a bit of assistance.  Does not quite feel back to his baseline but no other new complaints.  Review of Systems Otherwise negative except as per HPI, including: General = no fevers, chills, dizziness, malaise, fatigue HEENT/EYES = negative for pain, redness, loss of vision, double vision, blurred vision, loss of hearing, sore throat, hoarseness, dysphagia Cardiovascular= negative for chest pain, palpitation, murmurs, lower extremity swelling Respiratory/lungs= negative for shortness of breath, cough, hemoptysis, wheezing, mucus production Gastrointestinal= negative for nausea, vomiting,, abdominal pain, melena, hematemesis Genitourinary= negative for Dysuria, Hematuria, Change in Urinary Frequency MSK = Negative for arthralgia, myalgias, Back Pain, Joint swelling  Neurology= Negative for headache, seizures, numbness, tingling  Psychiatry= Negative for anxiety, depression, suicidal and homocidal ideation Allergy/Immunology= Medication/Food allergy as listed  Skin= Negative for Rash, lesions, ulcers, itching   Objective: Vitals:   03/17/18 0744 03/17/18 0831 03/17/18 0907 03/17/18 1130  BP:  (!) 142/72 140/68 (!) 142/70  Pulse:  73 71 71  Resp:  _0 Temp:  98.3 F  (36.8 C) 98.1 F (36.7 C) 97.9 F (  36.6 C)  TempSrc:  Oral Oral Oral  SpO2: 95% 95% 97% 97%  Weight:      Height:        Intake/Output Summary (Last 24 hours) at 03/17/2018 1154 Last data filed at 03/17/2018 1130 Gross per 24 hour  Intake 1080.99 ml  Output 100 ml  Net 980.99 ml   Filed Weights   03/05/18 1610 03/08/18 1931  Weight: 95.3 kg 94.8 kg    Examination:  Constitutional: NAD, calm, comfortable, 3 L nasal cannula Eyes: PERRL, lids and conjunctivae normal ENMT: Mucous membranes are moist. Posterior pharynx clear of any exudate or lesions.Normal dentition.  Neck: normal, supple, no masses, no thyromegaly Respiratory: Diminished breath sounds at the bases Cardiovascular: Regular rate and rhythm, no murmurs / rubs / gallops. No extremity edema. 2+ pedal pulses. No carotid bruits.  Abdomen: no tenderness, no masses palpated. No hepatosplenomegaly. Bowel sounds positive.  Musculoskeletal: no clubbing / cyanosis. No joint deformity upper and lower extremities. Good ROM, no contractures. Normal muscle tone.  Skin: no rashes, lesions, ulcers. No induration Neurologic: CN 2-12 grossly intact. Sensation intact, DTR normal. Strength 4/5 in all 4.  Psychiatric: Normal judgment and insight. Alert and oriented x 3. Normal mood.    Data Reviewed:   CBC: Recent Labs  Lab 03/12/18 [phone] 03/13/18 4037 03/14/18 0964 03/15/18 0511 03/16/18 0539 03/17/18 0512  WBC 4.1 3.6* 4.4 3.4* 2.6* 1.8*  NEUTROABS 3.0 2.7 2.8  --  1.1* 0.5*  HGB 9.0* 8.3* 8.4* 8.1* 7.6* 6.7*  HCT 29.0* 26.9* 27.4* 25.8* 24.7* 22.0*  MCV 98.6 98.2 98.2 97.4 96.1 97.8  PLT <5* 10* 7* 25* 18* 13*   Basic Metabolic Panel: Recent Labs  Lab 03/13/18 0638 03/14/18 0635 03/15/18 0511 03/16/18 0539 03/17/18 0512  NA 146* 148* 150* 147* 145  K 4.3 4.4 4.1 3.8 3.6  CL 114* 116* 119* 117* 115*  CO2 _0 GLUCOSE 106* 105* 120* 134* 115*  BUN 55* 53* 48* 37* 26*  CREATININE 1.73* 1.51* 1.40* 1.22  1.05  CALCIUM 9.1 9.2 9.1 8.8* 8.7*  MG  --   --  2.1 1.9 1.9   GFR: Estimated Creatinine Clearance: 61.6 mL/min (by C-G formula based on SCr of 1.05 mg/dL). Liver Function Tests: Recent Labs  Lab 03/12/18 3838 03/13/18 1840 03/14/18 0635 03/15/18 0511 03/17/18 0512  AST 201* 201* 185* 151* 91*  ALT 89* 97* 87* 76* 70*  ALKPHOS 251* 264* 284* 270* 216*  BILITOT 2.9* 2.2* 2.0* 2.0* 1.7*  PROT 5.0* 4.8* 4.9* 4.9* 4.7*  ALBUMIN 2.4* 2.4* 2.4* 2.5* 2.5*   No results for input(s): LIPASE, AMYLASE in the last 168 hours. Recent Labs  Lab 03/14/18 1028  AMMONIA 57*   Coagulation Profile: Recent Labs  Lab 03/12/18 0638  INR 1.35   Cardiac Enzymes: No results for input(s): CKTOTAL, CKMB, CKMBINDEX, TROPONINI in the last 168 hours. BNP (last 3 results) No results for input(s): PROBNP in the last 8760 hours. HbA1C: No results for input(s): HGBA1C in the last 72 hours. CBG: No results for input(s): GLUCAP in the last 168 hours. Lipid Profile: No results for input(s): CHOL, HDL, LDLCALC, TRIG, CHOLHDL, LDLDIRECT in the last 72 hours. Thyroid Function Tests: No results for input(s): TSH, T4TOTAL, FREET4, T3FREE, THYROIDAB in the last 72 hours. Anemia Panel: No results for input(s): VITAMINB12, FOLATE, FERRITIN, TIBC, IRON, RETICCTPCT in the last 72 hours. Sepsis Labs: No results for input(s): PROCALCITON, LATICACIDVEN in the last 168 hours.  Recent Results (from the past 240 hour(s))  Expectorated sputum assessment w rflx to resp cult     Status: None   Collection Time: 03/09/18  5:30 PM  Result Value Ref Range Status   Specimen Description EXPECTORATED SPUTUM  Final   Special Requests NONE  Final   Sputum evaluation   Final    THIS SPECIMEN IS ACCEPTABLE FOR SPUTUM CULTURE Performed at [facility], [place] [address], [place], [place] [address]    Report Status 03/09/2018 FINAL  Final  Culture, respiratory     Status: None   Collection Time: 03/09/18   5:30 PM  Result Value Ref Range Status   Specimen Description   Final    EXPECTORATED SPUTUM Performed at [facility] [address], [place], [place] [address]    Special Requests   Final    NONE Reflexed from [id number] Performed at [facility], [place] [address], [place], [place] [address]    Gram Stain   Final    ABUNDANT WBC PRESENT,BOTH PMN AND MONONUCLEAR FEW GRAM VARIABLE ROD FEW GRAM POSITIVE COCCI RARE YEAST Performed at [facility], [place] [address], [place],  [address]    Culture FEW Consistent with normal respiratory flora.  Final   Report Status 03/12/2018 FINAL  Final         Radiology Studies: No results found.      Scheduled Meds: . sodium chloride   Intravenous Once  . sodium chloride   Intravenous Once  . sodium chloride   Intravenous Once  . carvedilol  12.5 mg Oral BID WC  . feeding supplement (ENSURE ENLIVE)  237 mL Oral BID BM  . fluticasone furoate-vilanterol  1 puff Inhalation Daily  . ipratropium-albuterol  3 mL Nebulization TID  . lactulose  10 g Oral Daily  . mouth rinse  15 mL Mouth Rinse BID  . multivitamin with minerals  1 tablet Oral Daily  . sodium chloride flush  3 mL Intravenous Q12H  . Tbo-Filgrastim  300 mcg Subcutaneous Q2000  . umeclidinium bromide  1 puff Inhalation Daily   Continuous Infusions: . sodium chloride 50 mL/hr at 03/16/18 0334     LOS: 12 days   Time spent= 35 mins     Arsenio Loader, MD [facility]  If 7PM-7AM, please contact night-coverage [email or web address] 03/17/2018, 11:54 AM

## 2018-03-17 NOTE — Progress Notes (Signed)
Physical Therapy Treatment Patient Details Name: Joseph Berg MRN: [id number] DOB: 1934-01-15 Today's Date: 03/17/2018    History of Present Illness Pt is an 85 y.o. male admitted 03/05/18 with progressive SOB, found to be hypoxic on room air by HHPT. Worked up for probable pneumonia; thrombocytopenia of unclear etiology, awaiting bone marrow biopsy. CT noted diverticulitis. PMH includes CHF, CAD, AAA, recent admission 01/2018 with PNA.    PT Comments    Pt in recliner just completed his 2nd unit of blood.  Assisted with amb a greater distance while monitoring sats.  General Gait Details: 50% VC's on proper walker to self distance and upright posture.   Decreased shuffled steps and slow corrective reaction.  Amb on 2 lts  sats avg 96%.  Pt progressing well enough to D/C to home as long as spouse is able.  Pt has a walker and will need oxygen.  SATURATION QUALIFICATIONS: (This note is used to comply with regulatory documentation for home oxygen)  Patient Saturations on Room Air at Rest = 94%  Patient Saturations on Room Air while Ambulating = 86%  Patient Saturations on 2 Liters of oxygen while Ambulating = 90%  Please briefly explain why patient needs home oxygen:  Pt requires supplemental oxygen to achieve therapeutic levels.    Follow Up Recommendations  Home health PT(pt and spouse decline SNF)     Equipment Recommendations  None recommended by PT    Recommendations for Other Services       Precautions / Restrictions Precautions Precautions: Fall Restrictions Weight Bearing Restrictions: No    Mobility  Bed Mobility Overal bed mobility: Independent Bed Mobility: Sit to Supine       Sit to supine: Min assist;Mod assist   General bed mobility comments: assist B LE up onto bed and scoot to [facility]  Transfers Overall transfer level: Needs assistance Equipment used: Rolling walker (2 wheeled) Transfers: Sit to/from Stand Sit to Stand: Min assist;Mod assist          General transfer comment: 50% VC's on proper hand placement and increased time to rise and lower  Ambulation/Gait Ambulation/Gait assistance: Min guard;Min assist Gait Distance (Feet): 30 Feet Assistive device: Rolling walker (2 wheeled) Gait Pattern/deviations: Step-to pattern;Step-through pattern;Decreased step length - right;Decreased step length - left Gait velocity: Decreased   General Gait Details: 50% VC's on proper walker to self distance and upright posture.   Decreased shuffled steps and slow corrective reaction.  Amb on 2 lts  sats avg 96%.     Stairs             Wheelchair Mobility    Modified Rankin (Stroke Patients Only)       Balance                                            Cognition Arousal/Alertness: Awake/alert Behavior During Therapy: WFL for tasks assessed/performed Overall Cognitive Status: Within Functional Limits for tasks assessed                                 General Comments: very sweet      Exercises      General Comments        Pertinent Vitals/Pain Pain Assessment: No/denies pain    Home Living  Prior Function            PT Goals (current goals can now be found in the care plan section) Progress towards PT goals: Progressing toward goals    Frequency    Min 3X/week      PT Plan Current plan remains appropriate    Co-evaluation              AM-PAC PT "6 Clicks" Mobility   Outcome Measure  Help needed turning from your back to your side while in a flat bed without using bedrails?: A Little Help needed moving from lying on your back to sitting on the side of a flat bed without using bedrails?: A Little Help needed moving to and from a bed to a chair (including a wheelchair)?: A Little Help needed standing up from a chair using your arms (e.g., wheelchair or bedside chair)?: A Little Help needed to walk in hospital room?: A Little Help  needed climbing 3-5 steps with a railing? : A Lot 6 Click Score: 17    End of Session Equipment Utilized During Treatment: Oxygen Activity Tolerance: Patient tolerated treatment well Patient left: in bed;with call bell/phone within reach;with family/visitor present Nurse Communication: Mobility status PT Visit Diagnosis: Other abnormalities of gait and mobility (R26.89)     Time: [id number] PT Time Calculation (min) (ACUTE ONLY): 25 min  Charges:  $Gait Training: 8-22 mins $Therapeutic Activity: 8-22 mins                     Rica Koyanagi  PTA Acute  Rehabilitation Services Pager      [phone] Office      [phone]

## 2018-03-17 NOTE — Progress Notes (Signed)
IP PROGRESS NOTE  Subjective:   Joseph Berg is alert.  No new complaint.  He was out of bed with physical therapy yesterday.  No bleeding.  His wife and son are at the bedside. Objective: Vital signs in last 24 hours: Blood pressure 140/76, pulse 96, temperature 97.9 F (36.6 C), temperature source Oral, resp. rate (!) 25, height 5' 9"  (1.753 m), weight 210 lb (95.3 kg), SpO2 92 %.  Intake/Output from previous day: 01/27 0701 - 01/28 0700 In: 240 [P.O.:240] Out: -   Physical Exam:  HEENT: No thrush or bleeding Lungs: Clear anteriorly, no respiratory distress Cardiac: Regular rhythm, distant heart sounds Abdomen: No hepatosplenomegaly, nontender Extremities: Trace edema at lower leg and foot bilaterally Skin: Multiple ecchymoses over the arms and legs appear to be resolving Neurologic: Alert, follows commands   Lab Results: Recent Labs    03/05/18 1636 03/05/18 2221 03/06/18 0341  WBC 7.5  --  7.9  HGB 12.2*  --  11.6*  HCT 37.2*  --  36.1*  PLT 8* 8* 7*   Platelets 13, ANC 0.5, hemoglobin 6.7 BMET Recent Labs    03/05/18 1636 03/05/18 1752 03/06/18 0341  NA 141  --  143  K 2.8*  --  3.2*  CL 104  --  102  CO2 24  --  26  GLUCOSE 106*  --  113*  BUN 14  --  15  CREATININE 1.45* 1.40* 1.58*  CALCIUM 9.7  --  10.5*  Creatinine 1.05, AST 91, ALT 70, bilirubin 1.7 Dg Chest 2 View  Result Date: 03/05/2018 CLINICAL DATA:  Short of breath.  Recent pneumonia EXAM: CHEST - 2 VIEW COMPARISON:  None. FINDINGS: Sternotomy wires overlie normal cardiac silhouette. Normal pulmonary vasculature. No effusion, infiltrate, or pneumothorax. No acute osseous abnormality. IMPRESSION: No acute cardiopulmonary process. Electronically Signed   By: Suzy Bouchard M.D.   On: 03/05/2018 15:11   Ct Angio Chest/abd/pel For Dissection W And/or W/wo  Addendum Date: 03/05/2018   ADDENDUM REPORT: 03/05/2018 20:17 ADDENDUM: Correction: There are several omissions in the body of the chest CT  report due to recent change from table mike to speech microphone. Under cardiovascular, it should state NO large central pulmonary embolus is noted. And likewise NO thoracic aortic aneurysm is identified. Under lungs/pleura common should state: As stated above, there is pulmonary consolidation in the superior segment of the left lower lobe abutting the pleura. A small right effusion and atelectasis is noted. Centrilobular emphysema is noted, upper lobe predominant. Findings discussed with Wyn Quaker. Electronically Signed   By: Ashley Royalty M.D.   On: 03/05/2018 20:17   Result Date: 03/05/2018 CLINICAL DATA:  Dyspnea on exertion. Recently diagnosed 12 5 discharge 37 EXAM: CT ANGIOGRAPHY CHEST, ABDOMEN AND PELVIS TECHNIQUE: Multidetector CT imaging through the chest, abdomen and pelvis was performed using the standard protocol during bolus administration of intravenous contrast. Multiplanar reconstructed images and MIPs were obtained and reviewed to evaluate the vascular anatomy. CONTRAST:  149m ISOVUE-370 IOPAMIDOL (ISOVUE-370) INJECTION 76% COMPARISON:  None. FINDINGS: CTA CHEST FINDINGS Cardiovascular: The unenhanced images through the chest demonstrate. Atherosclerosis of the thoracic aorta branch vessels are. Native main three-vessel coronary arteriosclerosis is noted with post CABG change present heart size is top normal effusion After IV contrast administration thoracic aortic aneurysm is identified. Linear streak artifacts are noted within the common carotid and subclavian veins emanating hyperdense contrast the adjacent left subclavian conclusive evidence dissection. Large central embolus is noted though the study is not  tailored toward assessment emboli Mediastinum/Nodes: Normal sized thyroid gland without dominant mass. Mildly enlarged left tracheobronchial lymph nodes measuring up to 12 mm short axis larger 2 cm short axis left hilar soft tissue density/lymph nodes noted. These may be reactive  given what appear to pulmonary consolidations in the superior segment of the left lower lobe compatible with pneumonia and/or atelectasis. Patent trachea and mainstem bronchi. The CT appearance of the esophagus is unremarkable. Lungs/Pleura: As stated above, there is pulmonary consolidation in superior segment of the lower lobe abutting the small right effusion and atelectasis. Centrilobular emphysema is, upper lobe. No pneumothorax. Musculoskeletal: No chest wall abnormality. No acute or significant osseous findings. Mild degenerative change along the thoracic spine. Median sternotomy sutures are in place. Review of the MIP images confirms the above findings. CTA ABDOMEN AND PELVIS FINDINGS VASCULAR Aorta: 4.5 cm infrarenal abdominal aortic aneurysm crescentic soft plaque along the posterior and left lateral wall series 8/92. Dissection. Celiac: Minimal atherosclerosis at the origin celiac axis patent branch vessels with atherosclerosis along the splenic artery dissection, aneurysm or significant stenosis. SMA: Patent with atherosclerotic origins. Occlusion or dissection aneurysm Renals: Single bilateral renal arteries atherosclerosis but fibromuscular dysplasia, stenosis, aneurysm or dissection identified. IMA: Patent Inflow: Patent without evidence of aneurysm, dissection, vasculitis or significant stenosis. Atherosclerosis common iliac arteries probable chronic calcified dissection involving the proximal left common iliac artery, series 8/233. Veins: No obvious venous abnormality within the limitations of this arterial phase study. Review of the MIP images confirms the above findings. NON-VASCULAR Hepatobiliary: No focal liver abnormality is seen. No gallstones, gallbladder wall thickening, or biliary dilatation. Pancreas: Mild ectasia of the pancreatic duct. No inflammation or dominant enhancing mass. Spleen: Normal Adrenals/Urinary Tract: Normal bilateral adrenal glands. Bilateral renal cysts without  obstructive uropathy. No solid enhancing masses are noted. The dependent layering bladder calculi are identified. Diverticulosis of the bladder is also noted at the dome of the bladder. Stomach/Bowel: Decompressed stomach with normal duodenal sweep and ligament of Treitz position. No acute bowel obstruction. Extensive colonic diverticulosis is noted along the descending sigmoid colon. Suggestion mild pericolonic inflammation along the mid sigmoid raises the possibility of a subtle sigmoid diverticulitis, series 8/250. Lymphatic: No chest, abdomen or pelvic adenopathy. Reproductive: Mildly enlarged prostate with peripheral zone calcifications. Other: No free air or free fluid. Musculoskeletal: Lumbar spondylosis with multilevel degenerative disc and endplate changes. Schmorl's node involving the superior endplate of L3. Review of the MIP images confirms the above findings. IMPRESSION: 1. 4.5 cm infrarenal abdominal aortic aneurysm with crescentic soft plaque along the posterior and left lateral wall. Patent branch vessels with atherosclerosis 2. No thoracic aortic aneurysm or dissection. No acute pulmonary embolus. 3. Left lower lobe pneumonia with with mediastinal and left hilar reactive adenopathy. Follow-up to assure resolution is suggested. 4. Bilateral renal cysts. 5. Descending and sigmoid colonic diverticulosis with subtle pericolonic fatty induration along the mid sigmoid raises the possibility of a mild uncomplicated sigmoid diverticulitis. Electronically Signed: By: Ashley Royalty M.D. On: 03/05/2018 19:57    Medications: I have reviewed the patient's current medications.  Assessment/Plan:  1.    Extensive stage small cell lung cancer  CT chest 03/05/2018-left hilar mass, small mediastinal lymph nodes, atelectasis versus consolidation versus mass in the superior segment of the left lower lobe  Bone marrow biopsy 03/07/2018-extensive involvement of the bone marrow with metastatic small cell carcinoma  consistent with a lung primary, small foci of non-small cell differentiation  Cycle 1 etoposide/carboplatin 03/09/2018, G-CSF started 03/12/2018 2.  Thrombocytopenia secondary to #1 3.  COPD 4.  History of coronary artery disease 5.  CHF 6.  BPH 7.  Admission with pneumonia December 2019 8.  Mild renal insufficiency-improved 9.  Elevated liver enzymes 10.  Coagulopathy 11.  History of mild elevation of the calcium level 12.  Anemia/leukopenia secondary to small cell lung cancer and chemotherapy  Mr. Birdsall is now at day 9 following cycle 1 etoposide/carboplatin.  His performance status has improved.  The elevated liver enzymes and renal failure are improved.  Oxygen has been weaned.  He has pancytopenia.  The white count is falling.  The platelets are adequate today.  He was able to get out of bed today.  I encouraged him to increase his diet and getting out of bed as tolerated.  He is alert and reports having frequent bowel movements.  I will decrease the lactulose dose. Recommendations: 1.  Continue G-CSF, follow daily CBC with differential 2.  Increase ambulation, physical therapy  3.  Transfuse packed red blood cells today 4.  Crossmatch platelet transfusion tomorrow 5.  Blood/urine cultures and  begin broad-spectrum intravenous antibiotics for a fever spike     LOS: 1 day   [name], MD   03/06/2018, 8:55 AM

## 2018-03-18 DIAGNOSIS — Z79899 Other long term (current) drug therapy: Secondary | ICD-10-CM

## 2018-03-18 LAB — CBC WITH DIFFERENTIAL/PLATELET
Abs Immature Granulocytes: 0.03 10*3/uL (ref 0.00–0.07)
Basophils Absolute: 0 10*3/uL (ref 0.0–0.1)
Basophils Relative: 0 %
Eosinophils Absolute: 0 10*3/uL (ref 0.0–0.5)
Eosinophils Relative: 2 %
HCT: 27.3 % — ABNORMAL LOW (ref 39.0–52.0)
Hemoglobin: 8.7 g/dL — ABNORMAL LOW (ref 13.0–17.0)
Immature Granulocytes: 2 %
Lymphocytes Relative: 57 %
Lymphs Abs: 1.1 10*3/uL (ref 0.7–4.0)
MCH: 29.9 pg (ref 26.0–34.0)
MCHC: 31.9 g/dL (ref 30.0–36.0)
MCV: 93.8 fL (ref 80.0–100.0)
Monocytes Absolute: 0.2 10*3/uL (ref 0.1–1.0)
Monocytes Relative: 12 %
Neutro Abs: 0.5 10*3/uL — ABNORMAL LOW (ref 1.7–7.7)
Neutrophils Relative %: 27 %
Platelets: 11 10*3/uL — CL (ref 150–400)
RBC: 2.91 MIL/uL — ABNORMAL LOW (ref 4.22–5.81)
RDW: 16.1 % — ABNORMAL HIGH (ref 11.5–15.5)
WBC: 1.9 10*3/uL — ABNORMAL LOW (ref 4.0–10.5)
nRBC: 2.6 % — ABNORMAL HIGH (ref 0.0–0.2)

## 2018-03-18 LAB — TYPE AND SCREEN
ABO/RH(D): O POS
Antibody Screen: NEGATIVE
Unit division: 0
Unit division: 0

## 2018-03-18 LAB — BASIC METABOLIC PANEL
Anion gap: 6 (ref 5–15)
BUN: 22 mg/dL (ref 8–23)
CO2: 26 mmol/L (ref 22–32)
CREATININE: 0.93 mg/dL (ref 0.61–1.24)
Calcium: 8.8 mg/dL — ABNORMAL LOW (ref 8.9–10.3)
Chloride: 113 mmol/L — ABNORMAL HIGH (ref 98–111)
GFR calc Af Amer: >60 mL/min (ref >60)
GFR calc non Af Amer: >60 mL/min (ref >60)
Glucose, Bld: 100 mg/dL — ABNORMAL HIGH (ref 70–99)
Potassium: 3.7 mmol/L (ref 3.5–5.1)
Sodium: 145 mmol/L (ref 135–145)

## 2018-03-18 LAB — BPAM RBC
Blood Product Expiration Date: 202003052359
Blood Product Expiration Date: 202003052359
ISSUE DATE / TIME: 202002080842
ISSUE DATE / TIME: 202002081227
Unit Type and Rh: 5100
Unit Type and Rh: 5100

## 2018-03-18 LAB — MAGNESIUM: Magnesium: 1.7 mg/dL (ref 1.7–2.4)

## 2018-03-18 LAB — LACTATE DEHYDROGENASE: LDH: 266 U/L — ABNORMAL HIGH (ref 98–192)

## 2018-03-18 MED ORDER — POTASSIUM CHLORIDE CRYS ER 20 MEQ PO TBCR
40.0000 meq | EXTENDED_RELEASE_TABLET | Freq: Once | ORAL | Status: AC
  Administered 2018-03-18: 40 meq via ORAL
  Filled 2018-03-18: qty 2

## 2018-03-18 MED ORDER — MAGNESIUM OXIDE 400 (241.3 MG) MG PO TABS
800.0000 mg | ORAL_TABLET | Freq: Once | ORAL | Status: AC
  Administered 2018-03-18: 800 mg via ORAL
  Filled 2018-03-18: qty 2

## 2018-03-18 NOTE — Progress Notes (Signed)
PROGRESS NOTE    Joseph Berg  ZGY:[id number] DOB: Jun 28, 1933 DOA: 03/05/2018 PCP: [name], MD   Brief Narrative:  85 year old with history of COPD, chronic systolic congestive heart failure, coronary artery disease, thrombocytopenia initially admitted to [facility] for left lower lobe pneumonia with severe thrombocytopenia.  Subsequently underwent bone marrow biopsy which showed concerns for small cell carcinoma-likely lung with bone marrow involvement.  Patient was transferred to [facility] long for chemotherapy.  He was started on carboplatin and etoposide on 1/31.   Assessment & Plan:   Principal Problem:   PNA (pneumonia) Active Problems:   CAD (coronary artery disease) of artery bypass graft   Chronic systolic congestive heart failure (HCC)   AAA (abdominal aortic aneurysm) without rupture (HCC)   Acute respiratory failure with hypoxia (HCC)   Diverticulitis possible   Thrombocytopenia (HCC)   Small cell carcinoma (HCC)  Small cell carcinoma of the lung Mild acute respiratory distress with hypoxia, 2 L nasal cannula -Continue scheduled and as needed bronchodilators.  Encourage incentive spirometry and flutter valve -Mentation improved, if necessary we will get CT/MRI brain - Chest x-ray this morning shows chronic left basilar changes, right PICC line satisfactory. -Chemotherapy per oncology team -TSH within normal limits, ammonia slightly elevated at 57  Severe thrombocytopenia and anemia - Blood count is down to 8.7.  Platelets are down to 11.  Oncology following -Status post 2 units PRBC transfusion -Total platelet transfusion-5 units  Elevated ammonia level, 57 -Titrate to about 3 bowel movements daily  Left lower lobe pneumonia -Streptococcal antigen positive, completed 7 days of Rocephin -Respiratory viral panel negative.    Chronic systolic congestive heart failure -Continue Coreg, Lasix on hold.  Stable  Coronary artery disease status post  CABG -Denies any chest pain, appears to be stable.    Obstructive sleep apnea -Unfortunately patient is off-and-on noncompliant with his CPAP.  Acute kidney injury, improved -Improved, trended down to 1.05  Generalized weakness secondary to his underlying malignancy and respiratory issues.  I have highly encouraged to the patient and family to go to skilled nursing facility but at this time they are very reluctant to do so.  They will have family discussion today to determine this.   DVT prophylaxis: Out of bed to chair Code Status: DNR Family Communication: Wife and daughter at bedside Disposition Plan: Maintain inpatient stay for blood transfusion.  Will discharge once cleared by oncology  Consultants:   Oncology  Procedures:   Bone marrow biopsy 1/29  Antimicrobials:   Completed course of Rocephin   Subjective: Patient reports slight exertional shortness of breath and requiring quite a bit of assistance from the staff to help him get around.  Patient and his wife are very reluctant to go to a skilled nursing facility.  I explained to them the importance of this.  Patient's daughter is at the bedside who also agrees that patient would benefit from skilled nursing facility.  Family will have the discussion today about this.  Review of Systems Otherwise negative except as per HPI, including: General = no fevers, chills, dizziness, malaise, fatigue HEENT/EYES = negative for pain, redness, loss of vision, double vision, blurred vision, loss of hearing, sore throat, hoarseness, dysphagia Cardiovascular= negative for chest pain, palpitation, murmurs, lower extremity swelling Respiratory/lungs= negative for shortness of breath, cough, hemoptysis, wheezing, mucus production Gastrointestinal= negative for nausea, vomiting,, abdominal pain, melena, hematemesis Genitourinary= negative for Dysuria, Hematuria, Change in Urinary Frequency MSK = Negative for arthralgia, myalgias, Back  Pain, Joint  swelling  Neurology= Negative for headache, seizures, numbness, tingling  Psychiatry= Negative for anxiety, depression, suicidal and homocidal ideation Allergy/Immunology= Medication/Food allergy as listed  Skin= Negative for Rash, lesions, ulcers, itching  Objective: Vitals:   03/17/18 1517 03/17/18 2036 03/17/18 2124 03/18/18 0527  BP: (!) 162/77  134/66 110/60  Pulse: 76  71 71  Resp: 18  20 18   Temp: (!) 97.5 F (36.4 C)  98.5 F (36.9 C) 98.2 F (36.8 C)  TempSrc: Oral  Oral Oral  SpO2: 96% 93% 95% 95%  Weight:      Height:        Intake/Output Summary (Last 24 hours) at 03/18/2018 1112 Last data filed at 03/17/2018 1839 Gross per 24 hour  Intake 1630.79 ml  Output -  Net 1630.79 ml   Filed Weights   03/05/18 1610 03/08/18 1931  Weight: 95.3 kg 94.8 kg    Examination:  Constitutional: NAD, calm, comfortable, on 3 L nasal cannula Eyes: PERRL, lids and conjunctivae normal ENMT: Mucous membranes are moist. Posterior pharynx clear of any exudate or lesions.Normal dentition.  Neck: normal, supple, no masses, no thyromegaly Respiratory: Diminished breath sounds at the bases Cardiovascular: Regular rate and rhythm, no murmurs / rubs / gallops. No extremity edema. 2+ pedal pulses. No carotid bruits.  Abdomen: no tenderness, no masses palpated. No hepatosplenomegaly. Bowel sounds positive.  Musculoskeletal: no clubbing / cyanosis. No joint deformity upper and lower extremities. Good ROM, no contractures. Normal muscle tone.  Skin: no rashes, lesions, ulcers. No induration Neurologic: CN 2-12 grossly intact. Sensation intact, DTR normal. Strength 4/5 in all 4.  Psychiatric: Normal judgment and insight. Alert and oriented x 3. Normal mood.   Data Reviewed:   CBC: Recent Labs  Lab 03/13/18 [phone] 03/14/18 2585 03/15/18 0511 03/16/18 0539 03/17/18 0512 03/18/18 0403  WBC 3.6* 4.4 3.4* 2.6* 1.8* 1.9*  NEUTROABS 2.7 2.8  --  1.1* 0.5* 0.5*  HGB 8.3* 8.4* 8.1*  7.6* 6.7* 8.7*  HCT 26.9* 27.4* 25.8* 24.7* 22.0* 27.3*  MCV 98.2 98.2 97.4 96.1 97.8 93.8  PLT 10* 7* 25* 18* 13* 11*   Basic Metabolic Panel: Recent Labs  Lab 03/14/18 0635 03/15/18 0511 03/16/18 0539 03/17/18 0512 03/18/18 0403  NA 148* 150* 147* 145 145  K 4.4 4.1 3.8 3.6 3.7  CL 116* 119* 117* 115* 113*  CO2 27 28 27 26 26   GLUCOSE 105* 120* 134* 115* 100*  BUN 53* 48* 37* 26* 22  CREATININE 1.51* 1.40* 1.22 1.05 0.93  CALCIUM 9.2 9.1 8.8* 8.7* 8.8*  MG  --  2.1 1.9 1.9 1.7   GFR: Estimated Creatinine Clearance: 69.6 mL/min (by C-G formula based on SCr of 0.93 mg/dL). Liver Function Tests: Recent Labs  Lab 03/12/18 2778 03/13/18 2423 03/14/18 0635 03/15/18 0511 03/17/18 0512  AST 201* 201* 185* 151* 91*  ALT 89* 97* 87* 76* 70*  ALKPHOS 251* 264* 284* 270* 216*  BILITOT 2.9* 2.2* 2.0* 2.0* 1.7*  PROT 5.0* 4.8* 4.9* 4.9* 4.7*  ALBUMIN 2.4* 2.4* 2.4* 2.5* 2.5*   No results for input(s): LIPASE, AMYLASE in the last 168 hours. Recent Labs  Lab 03/14/18 1028  AMMONIA 57*   Coagulation Profile: Recent Labs  Lab 03/12/18 0638  INR 1.35   Cardiac Enzymes: No results for input(s): CKTOTAL, CKMB, CKMBINDEX, TROPONINI in the last 168 hours. BNP (last 3 results) No results for input(s): PROBNP in the last 8760 hours. HbA1C: No results for input(s): HGBA1C in the last 72 hours. CBG:  No results for input(s): GLUCAP in the last 168 hours. Lipid Profile: No results for input(s): CHOL, HDL, LDLCALC, TRIG, CHOLHDL, LDLDIRECT in the last 72 hours. Thyroid Function Tests: No results for input(s): TSH, T4TOTAL, FREET4, T3FREE, THYROIDAB in the last 72 hours. Anemia Panel: No results for input(s): VITAMINB12, FOLATE, FERRITIN, TIBC, IRON, RETICCTPCT in the last 72 hours. Sepsis Labs: No results for input(s): PROCALCITON, LATICACIDVEN in the last 168 hours.  Recent Results (from the past 240 hour(s))  Expectorated sputum assessment w rflx to resp cult     Status:  None   Collection Time: 03/09/18  5:30 PM  Result Value Ref Range Status   Specimen Description EXPECTORATED SPUTUM  Final   Special Requests NONE  Final   Sputum evaluation   Final    THIS SPECIMEN IS ACCEPTABLE FOR SPUTUM CULTURE Performed at [facility], [place] [address], [place], [place] [address]    Report Status 03/09/2018 FINAL  Final  Culture, respiratory     Status: None   Collection Time: 03/09/18  5:30 PM  Result Value Ref Range Status   Specimen Description   Final    EXPECTORATED SPUTUM Performed at [place] [address] [place], [place] [address]    Special Requests   Final    NONE Reflexed from [id number] Performed at [facility], [place] [address], [place], [place] [address]    Gram Stain   Final    ABUNDANT WBC PRESENT,BOTH PMN AND MONONUCLEAR FEW GRAM VARIABLE ROD FEW GRAM POSITIVE COCCI RARE YEAST Performed at [facility], [place] [address] [place], [place] [address]    Culture FEW Consistent with normal respiratory flora.  Final   Report Status 03/12/2018 FINAL  Final         Radiology Studies: No results found.      Scheduled Meds: . sodium chloride   Intravenous Once  . sodium chloride   Intravenous Once  . sodium chloride   Intravenous Once  . carvedilol  12.5 mg Oral BID WC  . feeding supplement (ENSURE ENLIVE)  237 mL Oral BID BM  . fluticasone furoate-vilanterol  1 puff Inhalation Daily  . ipratropium-albuterol  3 mL Nebulization TID  . lactulose  10 g Oral Daily  . mouth rinse  15 mL Mouth Rinse BID  . multivitamin with minerals  1 tablet Oral Daily  . sodium chloride flush  3 mL Intravenous Q12H  . Tbo-Filgrastim  300 mcg Subcutaneous Q2000  . umeclidinium bromide  1 puff Inhalation Daily   Continuous Infusions: . sodium chloride 50 mL/hr at 03/16/18 0334     LOS: 13 days   Time spent= 25 mins    Joseph Hellmann Arsenio Loader, MD [facility]  If 7PM-7AM, please  contact night-coverage [email or web address] 03/18/2018, 11:12 AM

## 2018-03-18 NOTE — Progress Notes (Signed)
So far, Joseph Berg is doing pretty well.  He had chemotherapy.  He tolerated this well so far.  He does have quite a few ecchymoses on his arms.  There is been no bleeding.  His appetite is doing okay.  I told his wife that she can bring in what ever food that he would like.  His labs today show a white cell count 1.9.  Hemoglobin 8.7.  Platelet count 11,000.  He has had no mouth sores.  He has had no cough.  There is no increased shortness of breath.  His physical exam shows vital signs with a temperature of 98.2.  Pulse 71.  Blood pressure 110/60.  His head neck exam shows no oral lesions.  He has no mucositis.  There is no adenopathy in the neck.  Lungs are clear bilaterally.  He has some scattered wheezes.  Cardiac exam regular rate and rhythm with no murmurs, rubs or bruits.  Abdomen is soft.  He has good bowel sounds.  There is no fluid wave.  There is no palpable liver or spleen tip.  Extremities shows no clubbing, cyanosis or edema.  He has good range of motion of his joints.  Skin exam shows the scattered ecchymoses.  Neurological exam is nonfocal.  Joseph Berg has extensive stage small cell lung cancer.  Of note, he is a true [employer] hero.  He fought in the [place].  I thanked him for his service to our country.  He does not need any transfusions today.  I appreciate all the wonderful care that he is getting from all the staff up on 6 E.   Lattie Haw, MD  Michaelyn Barter 2:10

## 2018-03-19 DIAGNOSIS — D6481 Anemia due to antineoplastic chemotherapy: Secondary | ICD-10-CM

## 2018-03-19 DIAGNOSIS — T451X5A Adverse effect of antineoplastic and immunosuppressive drugs, initial encounter: Secondary | ICD-10-CM

## 2018-03-19 LAB — CBC WITH DIFFERENTIAL/PLATELET
Abs Immature Granulocytes: 0.21 10*3/uL — ABNORMAL HIGH (ref 0.00–0.07)
Basophils Absolute: 0 10*3/uL (ref 0.0–0.1)
Basophils Relative: 0 %
EOS PCT: 1 %
Eosinophils Absolute: 0 10*3/uL (ref 0.0–0.5)
HCT: 25 % — ABNORMAL LOW (ref 39.0–52.0)
Hemoglobin: 7.7 g/dL — ABNORMAL LOW (ref 13.0–17.0)
Immature Granulocytes: 9 %
LYMPHS PCT: 51 %
Lymphs Abs: 1.2 10*3/uL (ref 0.7–4.0)
MCH: 29.6 pg (ref 26.0–34.0)
MCHC: 30.8 g/dL (ref 30.0–36.0)
MCV: 96.2 fL (ref 80.0–100.0)
Monocytes Absolute: 0.3 10*3/uL (ref 0.1–1.0)
Monocytes Relative: 13 %
Neutro Abs: 0.6 10*3/uL — ABNORMAL LOW (ref 1.7–7.7)
Neutrophils Relative %: 26 %
Platelets: 27 10*3/uL — CL (ref 150–400)
RBC: 2.6 MIL/uL — ABNORMAL LOW (ref 4.22–5.81)
RDW: 15.9 % — ABNORMAL HIGH (ref 11.5–15.5)
WBC: 2.3 10*3/uL — ABNORMAL LOW (ref 4.0–10.5)
nRBC: 2.2 % — ABNORMAL HIGH (ref 0.0–0.2)

## 2018-03-19 LAB — PREPARE PLATELET PHERESIS: Unit division: 0

## 2018-03-19 LAB — BASIC METABOLIC PANEL
Anion gap: 4 — ABNORMAL LOW (ref 5–15)
BUN: 17 mg/dL (ref 8–23)
CO2: 27 mmol/L (ref 22–32)
Calcium: 8.6 mg/dL — ABNORMAL LOW (ref 8.9–10.3)
Chloride: 113 mmol/L — ABNORMAL HIGH (ref 98–111)
Creatinine, Ser: 0.95 mg/dL (ref 0.61–1.24)
GFR calc Af Amer: >60 mL/min (ref >60)
GFR calc non Af Amer: >60 mL/min (ref >60)
Glucose, Bld: 91 mg/dL (ref 70–99)
Potassium: 3.9 mmol/L (ref 3.5–5.1)
Sodium: 144 mmol/L (ref 135–145)

## 2018-03-19 LAB — MAGNESIUM: Magnesium: 1.8 mg/dL (ref 1.7–2.4)

## 2018-03-19 LAB — BPAM PLATELET PHERESIS
Blood Product Expiration Date: 202002102359
ISSUE DATE / TIME: 202002091330
Unit Type and Rh: 5100

## 2018-03-19 MED ORDER — TBO-FILGRASTIM 300 MCG/0.5ML ~~LOC~~ SOSY
300.0000 ug | PREFILLED_SYRINGE | Freq: Every day | SUBCUTANEOUS | Status: DC
  Administered 2018-03-19: 300 ug via SUBCUTANEOUS
  Filled 2018-03-19: qty 0.5

## 2018-03-19 NOTE — Progress Notes (Signed)
   03/19/18 1426  Clinical Encounter Type  Visited With Patient and family together  Visit Type Initial;Other (Comment) (AD)  Referral From Nurse  Consult/Referral To Chaplain  The chaplain responded to spiritual care consult for Pt. AD.  The Pt. wife-Joseph Berg was present with the Pt.  Joseph Berg explained the AD is complete waiting for the service of a  notary.  Joseph Berg shared her desire for notary to visit on Tuesday.  The Pt. and Joseph Berg are coordinating [place] care.  The chaplain is available for F/U spiritual care as needed.

## 2018-03-19 NOTE — Progress Notes (Signed)
Followed up with pt's wife re: disposition planning (pt sleeping). Wife states they still prefer to have pt come home but are beginning to be concerned that if he does not become more functional prior to DC, SNF rehab will be desired/needed. CSW made referrals to facilities in [place] at wife's request. Will follow up.  Sharren Bridge, MSW, LCSW Clinical Social Work 03/19/2018 [phone]

## 2018-03-19 NOTE — Progress Notes (Addendum)
PROGRESS NOTE    Joseph Berg  TIW:[id number] DOB: December 15, 1933 DOA: 03/05/2018 PCP: [name], MD   Brief Narrative:  85 year old with history of COPD, chronic systolic congestive heart failure, coronary artery disease, thrombocytopenia initially admitted to [facility] for left lower lobe pneumonia with severe thrombocytopenia.  Subsequently underwent bone marrow biopsy which showed concerns for small cell carcinoma-likely lung with bone marrow involvement.  Patient was transferred to [facility] long for chemotherapy.  He was started on carboplatin and etoposide on 1/31.   Assessment & Plan:   Principal Problem:   PNA (pneumonia) Active Problems:   CAD (coronary artery disease) of artery bypass graft   Chronic systolic congestive heart failure (HCC)   AAA (abdominal aortic aneurysm) without rupture (HCC)   Acute respiratory failure with hypoxia (HCC)   Diverticulitis possible   Thrombocytopenia (HCC)   Small cell carcinoma (HCC)  Small cell carcinoma of the lung Mild acute respiratory distress with hypoxia, 2-3 L nasal cannula -Continue scheduled and as needed bronchodilators.  Encourage incentive spirometry and flutter valve -Mentation improved, if necessary we will get CT/MRI brain - Chest x-ray this morning shows chronic left basilar changes, right PICC line satisfactory. -Chemotherapy per oncology team -TSH within normal limits, ammonia slightly elevated at 57  Severe thrombocytopenia and anemia - After transfusion platelets have improved to 27.  Hemoglobin is down to 7.7.  No obvious evidence of bleeding.  Hematology/oncology following. -Status post 2 units PRBC transfusion -Total platelet transfusion-5 units  Left lower lobe pneumonia -Streptococcal antigen positive, completed 7 days of Rocephin -Respiratory viral panel negative.    Chronic systolic congestive heart failure -Continue Coreg, Lasix on hold.  Stable  Coronary artery disease status post  CABG -Denies any chest pain, appears to be stable.    Obstructive sleep apnea -Unfortunately patient is off-and-on noncompliant with his CPAP.  Acute kidney injury, improved -Improved, trended down to 1.05  Currently patient is extremely weak.  We need to get him to physical therapy/rehab as soon as possible before he further deteriorates in terms of his conditioning level.  Requires quite a bit of assistance even moving around in the bed.   DVT prophylaxis: Out of bed to chair Code Status: DNR Family Communication: Wife at bedside Disposition Plan: Maintain inpatient stay until cleared by oncology.  Will need skilled nursing facility  Consultants:   Oncology  Procedures:   Bone marrow biopsy 1/29  Antimicrobials:   Completed course of Rocephin   Subjective: Patient feels quite weak.  No obvious signs of bleeding.  Minimal exertional shortness of breath.  Still requiring 2-3 L of nasal cannula.  Review of Systems Otherwise negative except as per HPI, including: General = no fevers, chills, dizziness, malaise, fatigue HEENT/EYES = negative for pain, redness, loss of vision, double vision, blurred vision, loss of hearing, sore throat, hoarseness, dysphagia Cardiovascular= negative for chest pain, palpitation Respiratory/lungs= negative for shortness of breath, cough, hemoptysis, wheezing, mucus production Gastrointestinal= negative for nausea, vomiting, melena, hematemesis Genitourinary= negative for Dysuria, Hematuria, Change in Urinary Frequency MSK = Negative for arthralgia, myalgias, Back Pain, Joint swelling  Neurology= Negative for headache, seizures, numbness, tingling  Psychiatry= Negative for anxiety, depression, suicidal and homocidal ideation Allergy/Immunology= Medication/Food allergy as listed  Skin= Negative for Rash, lesions, ulcers, itching  Objective: Vitals:   03/18/18 1934 03/18/18 2215 03/19/18 0517 03/19/18 0837  BP: 128/61  132/61   Pulse: 71  69    Resp: 20     Temp: 99.4 F (37.4  C)  98.3 F (36.8 C)   TempSrc: Oral  Oral   SpO2: 96% 95% 93% 92%  Weight:      Height:        Intake/Output Summary (Last 24 hours) at 03/19/2018 1031 Last data filed at 03/19/2018 0800 Gross per 24 hour  Intake 593.33 ml  Output -  Net 593.33 ml   Filed Weights   03/05/18 1610 03/08/18 1931  Weight: 95.3 kg 94.8 kg    Examination:  Constitutional: NAD, calm, comfortable, on 2 L nasal cannula Eyes: PERRL, lids and conjunctivae normal ENMT: Mucous membranes are moist. Posterior pharynx clear of any exudate or lesions.Normal dentition.  Neck: normal, supple, no masses, no thyromegaly Respiratory: Diminished breath sounds at the bases Cardiovascular: Regular rate and rhythm, no murmurs / rubs / gallops. No extremity edema. 2+ pedal pulses. No carotid bruits.  Abdomen: no tenderness, no masses palpated. No hepatosplenomegaly. Bowel sounds positive.  Musculoskeletal: no clubbing / cyanosis. No joint deformity upper and lower extremities. Good ROM, no contractures. Normal muscle tone.  Skin: no rashes, lesions, ulcers. No induration Neurologic: CN 2-12 grossly intact. Sensation intact, DTR normal. Strength 4/5 in all 4.  Psychiatric: Normal judgment and insight. Alert and oriented x 3. Normal mood.    Data Reviewed:   CBC: Recent Labs  Lab 03/14/18 0635 03/15/18 0511 03/16/18 0539 03/17/18 0512 03/18/18 0403 03/19/18 0514  WBC 4.4 3.4* 2.6* 1.8* 1.9* 2.3*  NEUTROABS 2.8  --  1.1* 0.5* 0.5* 0.6*  HGB 8.4* 8.1* 7.6* 6.7* 8.7* 7.7*  HCT 27.4* 25.8* 24.7* 22.0* 27.3* 25.0*  MCV 98.2 97.4 96.1 97.8 93.8 96.2  PLT 7* 25* 18* 13* 11* 27*   Basic Metabolic Panel: Recent Labs  Lab 03/15/18 0511 03/16/18 0539 03/17/18 0512 03/18/18 0403 03/19/18 0514  NA 150* 147* 145 145 144  K 4.1 3.8 3.6 3.7 3.9  CL 119* 117* 115* 113* 113*  CO2 _0 GLUCOSE 120* 134* 115* 100* 91  BUN 48* 37* 26* 22 17  CREATININE 1.40* 1.22 1.05  0.93 0.95  CALCIUM 9.1 8.8* 8.7* 8.8* 8.6*  MG 2.1 1.9 1.9 1.7 1.8   GFR: Estimated Creatinine Clearance: 68.1 mL/min (by C-G formula based on SCr of 0.95 mg/dL). Liver Function Tests: Recent Labs  Lab 03/13/18 1638 03/14/18 0635 03/15/18 0511 03/17/18 0512  AST 201* 185* 151* 91*  ALT 97* 87* 76* 70*  ALKPHOS 264* 284* 270* 216*  BILITOT 2.2* 2.0* 2.0* 1.7*  PROT 4.8* 4.9* 4.9* 4.7*  ALBUMIN 2.4* 2.4* 2.5* 2.5*   No results for input(s): LIPASE, AMYLASE in the last 168 hours. Recent Labs  Lab 03/14/18 1028  AMMONIA 57*   Coagulation Profile: No results for input(s): INR, PROTIME in the last 168 hours. Cardiac Enzymes: No results for input(s): CKTOTAL, CKMB, CKMBINDEX, TROPONINI in the last 168 hours. BNP (last 3 results) No results for input(s): PROBNP in the last 8760 hours. HbA1C: No results for input(s): HGBA1C in the last 72 hours. CBG: No results for input(s): GLUCAP in the last 168 hours. Lipid Profile: No results for input(s): CHOL, HDL, LDLCALC, TRIG, CHOLHDL, LDLDIRECT in the last 72 hours. Thyroid Function Tests: No results for input(s): TSH, T4TOTAL, FREET4, T3FREE, THYROIDAB in the last 72 hours. Anemia Panel: No results for input(s): VITAMINB12, FOLATE, FERRITIN, TIBC, IRON, RETICCTPCT in the last 72 hours. Sepsis Labs: No results for input(s): PROCALCITON, LATICACIDVEN in the last 168 hours.  Recent Results (from the past 240 hour(s))  Expectorated sputum assessment w rflx to resp cult     Status: None   Collection Time: 03/09/18  5:30 PM  Result Value Ref Range Status   Specimen Description EXPECTORATED SPUTUM  Final   Special Requests NONE  Final   Sputum evaluation   Final    THIS SPECIMEN IS ACCEPTABLE FOR SPUTUM CULTURE Performed at [facility], [place] [address], [place], [place] [address]    Report Status 03/09/2018 FINAL  Final  Culture, respiratory     Status: None   Collection Time: 03/09/18  5:30 PM  Result Value Ref  Range Status   Specimen Description   Final    EXPECTORATED SPUTUM Performed at [place] [address] [place], [place] [address]    Special Requests   Final    NONE Reflexed from [id number] Performed at [facility], [place] [address] [place], [place] [address]    Gram Stain   Final    ABUNDANT WBC PRESENT,BOTH PMN AND MONONUCLEAR FEW GRAM VARIABLE ROD FEW GRAM POSITIVE COCCI RARE YEAST Performed at [facility], [place] [address] [place],  [address]    Culture FEW Consistent with normal respiratory flora.  Final   Report Status 03/12/2018 FINAL  Final         Radiology Studies: No results found.      Scheduled Meds: . carvedilol  12.5 mg Oral BID WC  . feeding supplement (ENSURE ENLIVE)  237 mL Oral BID BM  . fluticasone furoate-vilanterol  1 puff Inhalation Daily  . ipratropium-albuterol  3 mL Nebulization TID  . lactulose  10 g Oral Daily  . mouth rinse  15 mL Mouth Rinse BID  . multivitamin with minerals  1 tablet Oral Daily  . sodium chloride flush  3 mL Intravenous Q12H  . umeclidinium bromide  1 puff Inhalation Daily   Continuous Infusions: . sodium chloride 50 mL/hr at 03/16/18 0334     LOS: 14 days   Time spent= 25 mins    Ankit Arsenio Loader, MD [facility]  If 7PM-7AM, please contact night-coverage [email or web address] 03/19/2018, 10:31 AM

## 2018-03-19 NOTE — Progress Notes (Signed)
IP PROGRESS NOTE  Subjective:   Mr. Montminy has no complaint.  No bleeding.  He reports getting out of bed yesterday.  His wife is at the bedside.  He is having 2 bowel movements a day. Objective: Vital signs in last 24 hours: Blood pressure 140/76, pulse 96, temperature 97.9 F (36.6 C), temperature source Oral, resp. rate (!) 25, height 5' 9"  (1.753 m), weight 210 lb (95.3 kg), SpO2 92 %.  Intake/Output from previous day: 01/27 0701 - 01/28 0700 In: 240 [P.O.:240] Out: -   Physical Exam:  HEENT: No thrush or bleeding Lungs: Clear anteriorly, no respiratory distress Cardiac: Regular rhythm, distant heart sounds Abdomen: No hepatosplenomegaly, nontender Extremities: Trace edema at lower leg and foot bilaterally Skin: Multiple ecchymoses over the arms and legs appear to be resolving Neurologic: Alert, follows commands   Lab Results: Recent Labs    03/05/18 1636 03/05/18 2221 03/06/18 0341  WBC 7.5  --  7.9  HGB 12.2*  --  11.6*  HCT 37.2*  --  36.1*  PLT 8* 8* 7*  Platelets 27,000, ANC 0.6  Recent Labs    03/05/18 1636 03/05/18 1752 03/06/18 0341  NA 141  --  143  K 2.8*  --  3.2*  CL 104  --  102  CO2 24  --  26  GLUCOSE 106*  --  113*  BUN 14  --  15  CREATININE 1.45* 1.40* 1.58*  CALCIUM 9.7  --  10.5*  Creatinine 0.95 Dg Chest 2 View  Result Date: 03/05/2018 CLINICAL DATA:  Short of breath.  Recent pneumonia EXAM: CHEST - 2 VIEW COMPARISON:  None. FINDINGS: Sternotomy wires overlie normal cardiac silhouette. Normal pulmonary vasculature. No effusion, infiltrate, or pneumothorax. No acute osseous abnormality. IMPRESSION: No acute cardiopulmonary process. Electronically Signed   By: Suzy Bouchard M.D.   On: 03/05/2018 15:11   Ct Angio Chest/abd/pel For Dissection W And/or W/wo  Addendum Date: 03/05/2018   ADDENDUM REPORT: 03/05/2018 20:17 ADDENDUM: Correction: There are several omissions in the body of the chest CT report due to recent change from table  mike to speech microphone. Under cardiovascular, it should state NO large central pulmonary embolus is noted. And likewise NO thoracic aortic aneurysm is identified. Under lungs/pleura common should state: As stated above, there is pulmonary consolidation in the superior segment of the left lower lobe abutting the pleura. A small right effusion and atelectasis is noted. Centrilobular emphysema is noted, upper lobe predominant. Findings discussed with Wyn Quaker. Electronically Signed   By: Ashley Royalty M.D.   On: 03/05/2018 20:17   Result Date: 03/05/2018 CLINICAL DATA:  Dyspnea on exertion. Recently diagnosed 12 5 discharge 39 EXAM: CT ANGIOGRAPHY CHEST, ABDOMEN AND PELVIS TECHNIQUE: Multidetector CT imaging through the chest, abdomen and pelvis was performed using the standard protocol during bolus administration of intravenous contrast. Multiplanar reconstructed images and MIPs were obtained and reviewed to evaluate the vascular anatomy. CONTRAST:  131m ISOVUE-370 IOPAMIDOL (ISOVUE-370) INJECTION 76% COMPARISON:  None. FINDINGS: CTA CHEST FINDINGS Cardiovascular: The unenhanced images through the chest demonstrate. Atherosclerosis of the thoracic aorta branch vessels are. Native main three-vessel coronary arteriosclerosis is noted with post CABG change present heart size is top normal effusion After IV contrast administration thoracic aortic aneurysm is identified. Linear streak artifacts are noted within the common carotid and subclavian veins emanating hyperdense contrast the adjacent left subclavian conclusive evidence dissection. Large central embolus is noted though the study is not tailored toward assessment emboli Mediastinum/Nodes: Normal sized  thyroid gland without dominant mass. Mildly enlarged left tracheobronchial lymph nodes measuring up to 12 mm short axis larger 2 cm short axis left hilar soft tissue density/lymph nodes noted. These may be reactive given what appear to pulmonary  consolidations in the superior segment of the left lower lobe compatible with pneumonia and/or atelectasis. Patent trachea and mainstem bronchi. The CT appearance of the esophagus is unremarkable. Lungs/Pleura: As stated above, there is pulmonary consolidation in superior segment of the lower lobe abutting the small right effusion and atelectasis. Centrilobular emphysema is, upper lobe. No pneumothorax. Musculoskeletal: No chest wall abnormality. No acute or significant osseous findings. Mild degenerative change along the thoracic spine. Median sternotomy sutures are in place. Review of the MIP images confirms the above findings. CTA ABDOMEN AND PELVIS FINDINGS VASCULAR Aorta: 4.5 cm infrarenal abdominal aortic aneurysm crescentic soft plaque along the posterior and left lateral wall series 8/92. Dissection. Celiac: Minimal atherosclerosis at the origin celiac axis patent branch vessels with atherosclerosis along the splenic artery dissection, aneurysm or significant stenosis. SMA: Patent with atherosclerotic origins. Occlusion or dissection aneurysm Renals: Single bilateral renal arteries atherosclerosis but fibromuscular dysplasia, stenosis, aneurysm or dissection identified. IMA: Patent Inflow: Patent without evidence of aneurysm, dissection, vasculitis or significant stenosis. Atherosclerosis common iliac arteries probable chronic calcified dissection involving the proximal left common iliac artery, series 8/233. Veins: No obvious venous abnormality within the limitations of this arterial phase study. Review of the MIP images confirms the above findings. NON-VASCULAR Hepatobiliary: No focal liver abnormality is seen. No gallstones, gallbladder wall thickening, or biliary dilatation. Pancreas: Mild ectasia of the pancreatic duct. No inflammation or dominant enhancing mass. Spleen: Normal Adrenals/Urinary Tract: Normal bilateral adrenal glands. Bilateral renal cysts without obstructive uropathy. No solid enhancing  masses are noted. The dependent layering bladder calculi are identified. Diverticulosis of the bladder is also noted at the dome of the bladder. Stomach/Bowel: Decompressed stomach with normal duodenal sweep and ligament of Treitz position. No acute bowel obstruction. Extensive colonic diverticulosis is noted along the descending sigmoid colon. Suggestion mild pericolonic inflammation along the mid sigmoid raises the possibility of a subtle sigmoid diverticulitis, series 8/250. Lymphatic: No chest, abdomen or pelvic adenopathy. Reproductive: Mildly enlarged prostate with peripheral zone calcifications. Other: No free air or free fluid. Musculoskeletal: Lumbar spondylosis with multilevel degenerative disc and endplate changes. Schmorl's node involving the superior endplate of L3. Review of the MIP images confirms the above findings. IMPRESSION: 1. 4.5 cm infrarenal abdominal aortic aneurysm with crescentic soft plaque along the posterior and left lateral wall. Patent branch vessels with atherosclerosis 2. No thoracic aortic aneurysm or dissection. No acute pulmonary embolus. 3. Left lower lobe pneumonia with with mediastinal and left hilar reactive adenopathy. Follow-up to assure resolution is suggested. 4. Bilateral renal cysts. 5. Descending and sigmoid colonic diverticulosis with subtle pericolonic fatty induration along the mid sigmoid raises the possibility of a mild uncomplicated sigmoid diverticulitis. Electronically Signed: By: Ashley Royalty M.D. On: 03/05/2018 19:57    Medications: I have reviewed the patient's current medications.  Assessment/Plan:  1.    Extensive stage small cell lung cancer  CT chest 03/05/2018-left hilar mass, small mediastinal lymph nodes, atelectasis versus consolidation versus mass in the superior segment of the left lower lobe  Bone marrow biopsy 03/07/2018-extensive involvement of the bone marrow with metastatic small cell carcinoma consistent with a lung primary, small foci  of non-small cell differentiation  Cycle 1 etoposide/carboplatin 03/09/2018, G-CSF started 03/12/2018 2.  Thrombocytopenia secondary to #1 3.  COPD  4.  History of coronary artery disease 5.  CHF 6.  BPH 7.  Admission with pneumonia December 2019 8.  Mild renal insufficiency-improved 9.  Elevated liver enzymes 10.  Coagulopathy 11.  History of mild elevation of the calcium level 12.  Anemia/leukopenia secondary to small cell lung cancer and chemotherapy  Mr. Puccinelli has an improved performance status.  The neutropenia and thrombocytopenia are slightly improved today.  He is now at day 11 following cycle 1 etoposide/carboplatin.  Plan to continue Granix until the white count is clearly increasing.  Recommendations: 1.  Continue G-CSF, follow daily CBC with differential 2.  Out of bed, increase ambulation as tolerated 3.  No transfusion today 4.  Discontinue lactulose     LOS: 1 day   [name], MD   03/06/2018, 8:55 AM

## 2018-03-20 ENCOUNTER — Telehealth: Payer: Self-pay | Admitting: Oncology

## 2018-03-20 ENCOUNTER — Other Ambulatory Visit: Payer: Self-pay | Admitting: *Deleted

## 2018-03-20 DIAGNOSIS — C801 Malignant (primary) neoplasm, unspecified: Secondary | ICD-10-CM

## 2018-03-20 LAB — CBC WITH DIFFERENTIAL/PLATELET
Abs Immature Granulocytes: 0.36 10*3/uL — ABNORMAL HIGH (ref 0.00–0.07)
Basophils Absolute: 0 10*3/uL (ref 0.0–0.1)
Basophils Relative: 0 %
Eosinophils Absolute: 0 10*3/uL (ref 0.0–0.5)
Eosinophils Relative: 1 %
HEMATOCRIT: 25.8 % — AB (ref 39.0–52.0)
Hemoglobin: 7.9 g/dL — ABNORMAL LOW (ref 13.0–17.0)
Immature Granulocytes: 8 %
LYMPHS ABS: 1.4 10*3/uL (ref 0.7–4.0)
Lymphocytes Relative: 32 %
MCH: 29.9 pg (ref 26.0–34.0)
MCHC: 30.6 g/dL (ref 30.0–36.0)
MCV: 97.7 fL (ref 80.0–100.0)
Monocytes Absolute: 0.5 10*3/uL (ref 0.1–1.0)
Monocytes Relative: 11 %
Neutro Abs: 2.1 10*3/uL (ref 1.7–7.7)
Neutrophils Relative %: 48 %
Platelets: 26 10*3/uL — CL (ref 150–400)
RBC: 2.64 MIL/uL — ABNORMAL LOW (ref 4.22–5.81)
RDW: 15.9 % — ABNORMAL HIGH (ref 11.5–15.5)
WBC: 4.5 10*3/uL (ref 4.0–10.5)
nRBC: 1.1 % — ABNORMAL HIGH (ref 0.0–0.2)

## 2018-03-20 LAB — BASIC METABOLIC PANEL
Anion gap: 6 (ref 5–15)
BUN: 15 mg/dL (ref 8–23)
CO2: 27 mmol/L (ref 22–32)
Calcium: 8.6 mg/dL — ABNORMAL LOW (ref 8.9–10.3)
Chloride: 112 mmol/L — ABNORMAL HIGH (ref 98–111)
Creatinine, Ser: 0.88 mg/dL (ref 0.61–1.24)
GFR calc Af Amer: >60 mL/min (ref >60)
GFR calc non Af Amer: >60 mL/min (ref >60)
Glucose, Bld: 100 mg/dL — ABNORMAL HIGH (ref 70–99)
Potassium: 3.5 mmol/L (ref 3.5–5.1)
Sodium: 145 mmol/L (ref 135–145)

## 2018-03-20 LAB — HEPATIC FUNCTION PANEL
ALBUMIN: 2.7 g/dL — AB (ref 3.5–5.0)
ALK PHOS: 186 U/L — AB (ref 38–126)
ALT: 60 U/L — ABNORMAL HIGH (ref 0–44)
AST: 58 U/L — ABNORMAL HIGH (ref 15–41)
Bilirubin, Direct: 0.6 mg/dL — ABNORMAL HIGH (ref 0.0–0.2)
Indirect Bilirubin: 0.9 mg/dL (ref 0.3–0.9)
Total Bilirubin: 1.5 mg/dL — ABNORMAL HIGH (ref 0.3–1.2)
Total Protein: 4.7 g/dL — ABNORMAL LOW (ref 6.5–8.1)

## 2018-03-20 LAB — MAGNESIUM: Magnesium: 1.8 mg/dL (ref 1.7–2.4)

## 2018-03-20 LAB — LACTATE DEHYDROGENASE: LDH: 296 U/L — ABNORMAL HIGH (ref 98–192)

## 2018-03-20 MED ORDER — POTASSIUM CHLORIDE CRYS ER 20 MEQ PO TBCR
40.0000 meq | EXTENDED_RELEASE_TABLET | Freq: Once | ORAL | Status: AC
  Administered 2018-03-20: 40 meq via ORAL
  Filled 2018-03-20: qty 2

## 2018-03-20 MED ORDER — MAGNESIUM OXIDE 400 (241.3 MG) MG PO TABS
800.0000 mg | ORAL_TABLET | Freq: Once | ORAL | Status: AC
  Administered 2018-03-20: 800 mg via ORAL
  Filled 2018-03-20: qty 2

## 2018-03-20 NOTE — Progress Notes (Signed)
PROGRESS NOTE    Joseph Berg  DYJ:[id number] DOB: 06-Oct-1933 DOA: 03/05/2018 PCP: [name], MD   Brief Narrative:  85 year old with history of COPD, chronic systolic congestive heart failure, coronary artery disease, thrombocytopenia initially admitted to [facility] for left lower lobe pneumonia with severe thrombocytopenia.  Subsequently underwent bone marrow biopsy which showed concerns for small cell carcinoma-likely lung with bone marrow involvement.  Patient was transferred to [facility] long for chemotherapy.  He was started on carboplatin and etoposide on 1/31.  Patient was off-and-on diuresed and received multiple units of PRBC and platelet transfusion.  His platelet count and hemoglobin has been up and down.  Mentation is better.  Significantly weak therefore finally after detailed conversation with the family and the wife, they are agreeable to skilled nursing facility.  During the hospitalization was also treated for left lower lobe pneumonia with 7 days of Rocephin.   Assessment & Plan:   Principal Problem:   PNA (pneumonia) Active Problems:   CAD (coronary artery disease) of artery bypass graft   Chronic systolic congestive heart failure (HCC)   AAA (abdominal aortic aneurysm) without rupture (HCC)   Acute respiratory failure with hypoxia (HCC)   Diverticulitis possible   Thrombocytopenia (HCC)   Small cell carcinoma (HCC)  Small cell carcinoma of the lung Mild acute respiratory distress with hypoxia, still on 2-3 L nasal cannula -Continue bronchodilator treatment, encourage incentive spirometry and flutter valve.  Mentation is back to baseline - Patient does have right arm PICC line.Has been in place since 1/30.This was placed per oncology for blood draws and chemotherapy.  Plans to leave the PICC line and at the time of discharge. -Chemotherapy per oncology team -TSH within normal limits  Severe thrombocytopenia and anemia - Hemoglobin and platelets  stable for now.  Will closely monitor this. -Status post 2 units PRBC transfusion -Total platelet transfusion-5 units  Left lower lobe pneumonia -Streptococcal antigen positive, completed 7 days of Rocephin -Respiratory viral panel negative.    Chronic systolic congestive heart failure -Continue Coreg, Lasix on hold.  Stable  Coronary artery disease status post CABG -Denies any chest pain, appears to be stable.    Obstructive sleep apnea -Unfortunately patient is off-and-on noncompliant with his CPAP.  Acute kidney injury, improved -Improved, trended down to 1.05  For his generalized weakness he will need skilled nursing facility placement   DVT prophylaxis: Out of bed to chair Code Status: DNR Family Communication: Wife at bedside Disposition Plan: Discharge to skilled nursing facility when placement is available.  Consultants:   Oncology  Procedures:   Bone marrow biopsy 1/29  PICC line placed 1/30  Antimicrobials:   Completed course of Rocephin   Subjective: Feels better but appears overall very weak.  Has trouble getting around in his room.  Requires quite a bit of assistance of at least 2-3 people  Review of Systems Otherwise negative except as per HPI, including: General = no fevers, chills, dizziness, malaise, fatigue HEENT/EYES = negative for pain, redness, loss of vision, double vision, blurred vision, loss of hearing, sore throat, hoarseness, dysphagia Cardiovascular= negative for chest pain, palpitation, murmurs, lower extremity swelling Respiratory/lungs= negative for shortness of breath, cough, hemoptysis, wheezing, mucus production Gastrointestinal= negative for nausea, vomiting,, abdominal pain, melena, hematemesis Genitourinary= negative for Dysuria, Hematuria, Change in Urinary Frequency MSK = Negative for arthralgia, myalgias, Back Pain, Joint swelling  Neurology= Negative for headache, seizures, numbness, tingling  Psychiatry= Negative for  anxiety, depression, suicidal and homocidal ideation Allergy/Immunology= Medication/Food  allergy as listed  Skin= Negative for Rash, lesions, ulcers, itching   Objective: Vitals:   03/19/18 2047 03/19/18 2155 03/20/18 0532 03/20/18 0931  BP: 135/61  139/64   Pulse: 73  73 73  Resp: 18  17 18   Temp: 82.9 F (37 C)  98.7 F (37.1 C)   TempSrc: Oral  Oral   SpO2: 93% 90% 96% 94%  Weight:      Height:        Intake/Output Summary (Last 24 hours) at 03/20/2018 1232 Last data filed at 03/20/2018 1100 Gross per 24 hour  Intake 960 ml  Output 500 ml  Net 460 ml   Filed Weights   03/05/18 1610 03/08/18 1931  Weight: 95.3 kg 94.8 kg    Examination:  Constitutional: NAD, calm, comfortable, on 2 L nasal cannula Eyes: PERRL, lids and conjunctivae normal ENMT: Mucous membranes are moist. Posterior pharynx clear of any exudate or lesions.Normal dentition.  Neck: normal, supple, no masses, no thyromegaly Respiratory: Slightly diminished breath sounds at the bases Cardiovascular: Regular rate and rhythm, no murmurs / rubs / gallops. No extremity edema. 2+ pedal pulses. No carotid bruits.  Abdomen: no tenderness, no masses palpated. No hepatosplenomegaly. Bowel sounds positive.  Musculoskeletal: no clubbing / cyanosis. No joint deformity upper and lower extremities. Good ROM, no contractures. Normal muscle tone.  Right arm PICC line in place Skin: no rashes, lesions, ulcers. No induration Neurologic: CN 2-12 grossly intact. Sensation intact, DTR normal. Strength 4/5 in all 4.  Psychiatric: Normal judgment and insight. Alert and oriented x 3. Normal mood.   Data Reviewed:   CBC: Recent Labs  Lab 03/16/18 0539 03/17/18 0512 03/18/18 0403 03/19/18 0514 03/20/18 0544  WBC 2.6* 1.8* 1.9* 2.3* 4.5  NEUTROABS 1.1* 0.5* 0.5* 0.6* 2.1  HGB 7.6* 6.7* 8.7* 7.7* 7.9*  HCT 24.7* 22.0* 27.3* 25.0* 25.8*  MCV 96.1 97.8 93.8 96.2 97.7  PLT 18* 13* 11* 27* 26*   Basic Metabolic  Panel: Recent Labs  Lab 03/16/18 0539 03/17/18 0512 03/18/18 0403 03/19/18 0514 03/20/18 0544  NA 147* 145 145 144 145  K 3.8 3.6 3.7 3.9 3.5  CL 117* 115* 113* 113* 112*  CO2 27 26 26 27 27   GLUCOSE 134* 115* 100* 91 100*  BUN 37* 26* 22 17 15   CREATININE 1.22 1.05 0.93 0.95 0.88  CALCIUM 8.8* 8.7* 8.8* 8.6* 8.6*  MG 1.9 1.9 1.7 1.8 1.8   GFR: Estimated Creatinine Clearance: 73.5 mL/min (by C-G formula based on SCr of 0.88 mg/dL). Liver Function Tests: Recent Labs  Lab 03/14/18 0635 03/15/18 0511 03/17/18 0512 03/20/18 0544  AST 185* 151* 91* 58*  ALT 87* 76* 70* 60*  ALKPHOS 284* 270* 216* 186*  BILITOT 2.0* 2.0* 1.7* 1.5*  PROT 4.9* 4.9* 4.7* 4.7*  ALBUMIN 2.4* 2.5* 2.5* 2.7*   No results for input(s): LIPASE, AMYLASE in the last 168 hours. Recent Labs  Lab 03/14/18 1028  AMMONIA 57*   Coagulation Profile: No results for input(s): INR, PROTIME in the last 168 hours. Cardiac Enzymes: No results for input(s): CKTOTAL, CKMB, CKMBINDEX, TROPONINI in the last 168 hours. BNP (last 3 results) No results for input(s): PROBNP in the last 8760 hours. HbA1C: No results for input(s): HGBA1C in the last 72 hours. CBG: No results for input(s): GLUCAP in the last 168 hours. Lipid Profile: No results for input(s): CHOL, HDL, LDLCALC, TRIG, CHOLHDL, LDLDIRECT in the last 72 hours. Thyroid Function Tests: No results for input(s): TSH, T4TOTAL, FREET4,  T3FREE, THYROIDAB in the last 72 hours. Anemia Panel: No results for input(s): VITAMINB12, FOLATE, FERRITIN, TIBC, IRON, RETICCTPCT in the last 72 hours. Sepsis Labs: No results for input(s): PROCALCITON, LATICACIDVEN in the last 168 hours.  No results found for this or any previous visit (from the past 240 hour(s)).       Radiology Studies: No results found.      Scheduled Meds: . carvedilol  12.5 mg Oral BID WC  . feeding supplement (ENSURE ENLIVE)  237 mL Oral BID BM  . fluticasone furoate-vilanterol  1  puff Inhalation Daily  . ipratropium-albuterol  3 mL Nebulization TID  . mouth rinse  15 mL Mouth Rinse BID  . multivitamin with minerals  1 tablet Oral Daily  . sodium chloride flush  3 mL Intravenous Q12H  . umeclidinium bromide  1 puff Inhalation Daily   Continuous Infusions: . sodium chloride 50 mL/hr at 03/16/18 0334     LOS: 15 days   Time spent= 20 mins    Ankit Arsenio Loader, MD [facility]  If 7PM-7AM, please contact night-coverage [email or web address] 03/20/2018, 12:32 PM

## 2018-03-20 NOTE — Progress Notes (Signed)
Nutrition Follow-up  DOCUMENTATION CODES:   Obesity unspecified  INTERVENTION:   -Continue Ensure Enlive po BID, each supplement provides 350 kcal and 20 grams of protein -Recommend new weight measurement  NUTRITION DIAGNOSIS:   Increased nutrient needs related to cancer and cancer related treatments as evidenced by estimated needs.  Ongoing.  GOAL:   Patient will meet greater than or equal to 90% of their needs  Progressing.  MONITOR:   PO intake, Supplement acceptance, Labs, Weight trends, I & O's  ASSESSMENT:   85 year old with history of COPD, chronic systolic congestive heart failure, coronary artery disease, thrombocytopenia initially admitted to [facility] for left lower lobe pneumonia with severe thrombocytopenia.  Admitted for chemotherapy.  1/31: Started chemotherapy  Patient consuming 60-100% of meals today and 60-80% of meals yesterday. Pt is drinking Ensure supplements without issue.  No new weight has been measured since 1/30.  Labs reviewed. Medications: Multivitamin with minerals daily, K-DUR tablet once  Diet Order:   Diet Order            Diet regular Room service appropriate? Yes; Fluid consistency: Thin  Diet effective now              EDUCATION NEEDS:   Not appropriate for education at this time  Skin:  Skin Assessment: Reviewed RN Assessment  Last BM:  2/6  Height:   Ht Readings from Last 1 Encounters:  03/08/18 5\' 9"  (1.753 m)    Weight:   Wt Readings from Last 1 Encounters:  03/08/18 94.8 kg    Ideal Body Weight:  72.7 kg  BMI:  Body mass index is 30.86 kg/m.  Estimated Nutritional Needs:   Kcal:  2100-2300  Protein:  105-110g  Fluid:  2.1L/day  Clayton Bibles, MS, RD, LDN [place] [facility] Pager: [phone] After Hours Pager: [phone]

## 2018-03-20 NOTE — Telephone Encounter (Signed)
Scheduled appt per 2/11 sch message - unable to reach patient . Left message with appt date and time

## 2018-03-20 NOTE — Progress Notes (Signed)
IP PROGRESS NOTE  Subjective:   Joseph Berg reports feeling better compared to hospital admission.  No bleeding.  He has not ambulated much.  His wife is at the bedside. Objective: Vital signs in last 24 hours: Blood pressure 140/76, pulse 96, temperature 97.9 F (36.6 C), temperature source Oral, resp. rate (!) 25, height 5' 9"  (1.753 m), weight 210 lb (95.3 kg), SpO2 92 %.  Intake/Output from previous day: 01/27 0701 - 01/28 0700 In: 240 [P.O.:240] Out: -   Physical Exam:  HEENT: No thrush or bleeding Lungs: Clear anteriorly, no respiratory distress Cardiac: Regular rhythm, distant heart sounds Abdomen: No hepatosplenomegaly, nontender Extremities: Trace edema at lower leg and foot bilaterally Skin: Ecchymoses over the extremities appear to be resolving, minimal erythema at the right great toe Neurologic: Alert, follows commands   Lab Results: Recent Labs    03/05/18 1636 03/05/18 2221 03/06/18 0341  WBC 7.5  --  7.9  HGB 12.2*  --  11.6*  HCT 37.2*  --  36.1*  PLT 8* 8* 7*  Platelets 26,000, ANC 2.1  Recent Labs    03/05/18 1636 03/05/18 1752 03/06/18 0341  NA 141  --  143  K 2.8*  --  3.2*  CL 104  --  102  CO2 24  --  26  GLUCOSE 106*  --  113*  BUN 14  --  15  CREATININE 1.45* 1.40* 1.58*  CALCIUM 9.7  --  10.5*  Creatinine 0.88, AST 58, ALT 60, alkaline phosphatase 186, bilirubin 1.5, LDH 296 Dg Chest 2 View  Result Date: 03/05/2018 CLINICAL DATA:  Short of breath.  Recent pneumonia EXAM: CHEST - 2 VIEW COMPARISON:  None. FINDINGS: Sternotomy wires overlie normal cardiac silhouette. Normal pulmonary vasculature. No effusion, infiltrate, or pneumothorax. No acute osseous abnormality. IMPRESSION: No acute cardiopulmonary process. Electronically Signed   By: Suzy Bouchard M.D.   On: 03/05/2018 15:11   Ct Angio Chest/abd/pel For Dissection W And/or W/wo  Addendum Date: 03/05/2018   ADDENDUM REPORT: 03/05/2018 20:17 ADDENDUM: Correction: There are  several omissions in the body of the chest CT report due to recent change from table mike to speech microphone. Under cardiovascular, it should state NO large central pulmonary embolus is noted. And likewise NO thoracic aortic aneurysm is identified. Under lungs/pleura common should state: As stated above, there is pulmonary consolidation in the superior segment of the left lower lobe abutting the pleura. A small right effusion and atelectasis is noted. Centrilobular emphysema is noted, upper lobe predominant. Findings discussed with Wyn Quaker. Electronically Signed   By: Ashley Royalty M.D.   On: 03/05/2018 20:17   Result Date: 03/05/2018 CLINICAL DATA:  Dyspnea on exertion. Recently diagnosed 12 5 discharge 92 EXAM: CT ANGIOGRAPHY CHEST, ABDOMEN AND PELVIS TECHNIQUE: Multidetector CT imaging through the chest, abdomen and pelvis was performed using the standard protocol during bolus administration of intravenous contrast. Multiplanar reconstructed images and MIPs were obtained and reviewed to evaluate the vascular anatomy. CONTRAST:  13m ISOVUE-370 IOPAMIDOL (ISOVUE-370) INJECTION 76% COMPARISON:  None. FINDINGS: CTA CHEST FINDINGS Cardiovascular: The unenhanced images through the chest demonstrate. Atherosclerosis of the thoracic aorta branch vessels are. Native main three-vessel coronary arteriosclerosis is noted with post CABG change present heart size is top normal effusion After IV contrast administration thoracic aortic aneurysm is identified. Linear streak artifacts are noted within the common carotid and subclavian veins emanating hyperdense contrast the adjacent left subclavian conclusive evidence dissection. Large central embolus is noted though the study is  not tailored toward assessment emboli Mediastinum/Nodes: Normal sized thyroid gland without dominant mass. Mildly enlarged left tracheobronchial lymph nodes measuring up to 12 mm short axis larger 2 cm short axis left hilar soft tissue  density/lymph nodes noted. These may be reactive given what appear to pulmonary consolidations in the superior segment of the left lower lobe compatible with pneumonia and/or atelectasis. Patent trachea and mainstem bronchi. The CT appearance of the esophagus is unremarkable. Lungs/Pleura: As stated above, there is pulmonary consolidation in superior segment of the lower lobe abutting the small right effusion and atelectasis. Centrilobular emphysema is, upper lobe. No pneumothorax. Musculoskeletal: No chest wall abnormality. No acute or significant osseous findings. Mild degenerative change along the thoracic spine. Median sternotomy sutures are in place. Review of the MIP images confirms the above findings. CTA ABDOMEN AND PELVIS FINDINGS VASCULAR Aorta: 4.5 cm infrarenal abdominal aortic aneurysm crescentic soft plaque along the posterior and left lateral wall series 8/92. Dissection. Celiac: Minimal atherosclerosis at the origin celiac axis patent branch vessels with atherosclerosis along the splenic artery dissection, aneurysm or significant stenosis. SMA: Patent with atherosclerotic origins. Occlusion or dissection aneurysm Renals: Single bilateral renal arteries atherosclerosis but fibromuscular dysplasia, stenosis, aneurysm or dissection identified. IMA: Patent Inflow: Patent without evidence of aneurysm, dissection, vasculitis or significant stenosis. Atherosclerosis common iliac arteries probable chronic calcified dissection involving the proximal left common iliac artery, series 8/233. Veins: No obvious venous abnormality within the limitations of this arterial phase study. Review of the MIP images confirms the above findings. NON-VASCULAR Hepatobiliary: No focal liver abnormality is seen. No gallstones, gallbladder wall thickening, or biliary dilatation. Pancreas: Mild ectasia of the pancreatic duct. No inflammation or dominant enhancing mass. Spleen: Normal Adrenals/Urinary Tract: Normal bilateral  adrenal glands. Bilateral renal cysts without obstructive uropathy. No solid enhancing masses are noted. The dependent layering bladder calculi are identified. Diverticulosis of the bladder is also noted at the dome of the bladder. Stomach/Bowel: Decompressed stomach with normal duodenal sweep and ligament of Treitz position. No acute bowel obstruction. Extensive colonic diverticulosis is noted along the descending sigmoid colon. Suggestion mild pericolonic inflammation along the mid sigmoid raises the possibility of a subtle sigmoid diverticulitis, series 8/250. Lymphatic: No chest, abdomen or pelvic adenopathy. Reproductive: Mildly enlarged prostate with peripheral zone calcifications. Other: No free air or free fluid. Musculoskeletal: Lumbar spondylosis with multilevel degenerative disc and endplate changes. Schmorl's node involving the superior endplate of L3. Review of the MIP images confirms the above findings. IMPRESSION: 1. 4.5 cm infrarenal abdominal aortic aneurysm with crescentic soft plaque along the posterior and left lateral wall. Patent branch vessels with atherosclerosis 2. No thoracic aortic aneurysm or dissection. No acute pulmonary embolus. 3. Left lower lobe pneumonia with with mediastinal and left hilar reactive adenopathy. Follow-up to assure resolution is suggested. 4. Bilateral renal cysts. 5. Descending and sigmoid colonic diverticulosis with subtle pericolonic fatty induration along the mid sigmoid raises the possibility of a mild uncomplicated sigmoid diverticulitis. Electronically Signed: By: Ashley Royalty M.D. On: 03/05/2018 19:57    Medications: I have reviewed the patient's current medications.  Assessment/Plan:  1.    Extensive stage small cell lung cancer  CT chest 03/05/2018-left hilar mass, small mediastinal lymph nodes, atelectasis versus consolidation versus mass in the superior segment of the left lower lobe  Bone marrow biopsy 03/07/2018-extensive involvement of the bone  marrow with metastatic small cell carcinoma consistent with a lung primary, small foci of non-small cell differentiation  Cycle 1 etoposide/carboplatin 03/09/2018, G-CSF started 03/12/2018 and  discontinued 03/20/2018 2.  Thrombocytopenia secondary to #1-improved 3.  COPD 4.  History of coronary artery disease 5.  CHF 6.  BPH 7.  Admission with pneumonia December 2019 8.  Mild renal insufficiency-improved 9.  Elevated liver enzymes 10.  Coagulopathy 11.  History of mild elevation of the calcium level 12.  Anemia/leukopenia secondary to small cell lung cancer and chemotherapy  Mr. Lawhorn is now at day 12 following cycle 1 etoposide/carboplatin.  The white count is recovering.  I will discontinue the G-CSF support.  The platelet count is stable today.  Hopefully this indicates platelet recovery.  He is stable for discharge from an oncology standpoint.  He needs skilled nursing facility placement for physical therapy.  Recommendations: 1.  Discontinue G-CSF 2.  Out of bed, continue physical therapy 3.  Outpatient follow-up will be scheduled at the Cancer center for cycle 2 chemotherapy 4.  Leave PICC in place at discharge     LOS: 1 day   [name], MD   03/06/2018, 8:55 AM

## 2018-03-20 NOTE — [place] FL2 (Signed)
[place] LEVEL OF CARE SCREENING TOOL     IDENTIFICATION  Patient Name: Joseph Berg Birthdate: [birth year] Sex: male Admission Date (Current Location): 03/05/2018  [facility] and [place] Number:  [place] and Address:  [facility],  [place] [address], [place]      Provider Number: [id number]  Attending Physician Name and Address:  Damita Lack, MD  Relative Name and Phone Number:       Current Level of Care: Hospital Recommended Level of Care: [place] Prior Approval Number:    Date Approved/Denied:   PASRR Number: [id number]  Discharge Plan: SNF    Current Diagnoses: Patient Active Problem List   Diagnosis Date Noted  . Small cell carcinoma ([place]) 03/09/2018  . PNA (pneumonia) 03/05/2018  . Diverticulitis possible 03/05/2018  . Thrombocytopenia ([place]) 03/05/2018  . Acute on chronic combined systolic and diastolic CHF (congestive heart failure) ([place]) 01/12/2018  . Multifocal pneumonia 01/11/2018  . Primary osteoarthritis of right shoulder 01/27/2017  . Bilateral calf pain 01/27/2017  . Trochanteric bursitis of both hips 12/28/2016  . Demand ischemia ([place])   . AKI (acute kidney injury) ([place]) 03/17/2016  . Acute respiratory failure with hypoxia ([place]) 03/17/2016  . Hyperglycemia 03/17/2016  . Postlaminectomy syndrome, lumbar region 12/14/2015  . COPD exacerbation ([place]) 03/27/2015  . HNP (herniated nucleus pulposus), lumbar 02/18/2015  . Claudication of right lower extremity ([place]) 12/09/2014  . Lumbar degenerative disc disease 05/13/2014  . Mixed restrictive and obstructive lung disease ([place]) 01/16/2014  . Cerebrovascular disease 06/26/2013  . AAA (abdominal aortic aneurysm) without rupture ([place]) 06/24/2013  . Aortic calcification ([place]) 06/10/2013  . S/P CABG x 2 08/17/2012  . Cardiomyopathy, ischemic 07/25/2012  . Chronic systolic congestive heart failure ([place]) 07/11/2012  . CAD (coronary  artery disease) of artery bypass graft 07/06/2012  . OSA (obstructive sleep apnea) 01/24/2012  . BPH (benign prostatic hyperplasia) 03/02/2011  . History of tobacco abuse 08/23/2010  . Hypoparathyroidism ([place]) 07/30/2010  . SCIATICA 11/10/2009  . INSULIN RESISTANCE SYNDROME 04/16/2007  . HYPERLIPIDEMIA NEC/NOS 11/14/2006  . HYPERTENSION, BENIGN ESSENTIAL 11/14/2006    Orientation RESPIRATION BLADDER Height & Weight     Situation, Place, Self  O2(3L) Incontinent Weight: 208 lb 15.9 oz (94.8 kg) Height:  5\' 9"  (175.3 cm)  BEHAVIORAL SYMPTOMS/MOOD NEUROLOGICAL BOWEL NUTRITION STATUS      Continent Diet(regular diet)  AMBULATORY STATUS COMMUNICATION OF NEEDS Skin   Extensive Assist Verbally Normal                       Personal Care Assistance Level of Assistance  Bathing, Feeding, Dressing Bathing Assistance: Maximum assistance Feeding assistance: Independent Dressing Assistance: Maximum assistance     Functional Limitations Info  Sight, Hearing, Speech Sight Info: Adequate Hearing Info: Adequate Speech Info: Adequate    SPECIAL CARE FACTORS FREQUENCY  PT (By licensed PT), OT (By licensed OT)     PT Frequency: 5x OT Frequency: 5x            Contractures Contractures Info: Not present    Additional Factors Info  Code Status, Allergies Code Status Info: DNR Allergies Info: hydrochlorothiazide           Current Medications (03/20/2018):  This is the current hospital active medication list Current Facility-Administered Medications  Medication Dose Route Frequency Provider Last Rate Last Dose  . 0.9 %  sodium chloride infusion  250 mL Intravenous Continuous Dahal, Marlowe Aschoff, MD 50 mL/hr  at 03/16/18 0334    . carvedilol (COREG) tablet 12.5 mg  12.5 mg Oral BID WC Jonetta Osgood, MD   12.5 mg at 03/20/18 0728  . feeding supplement (ENSURE ENLIVE) (ENSURE ENLIVE) liquid 237 mL  237 mL Oral BID BM Amin, Ankit Chirag, MD   237 mL at 03/20/18 1016  . fluticasone  furoate-vilanterol (BREO ELLIPTA) 100-25 MCG/INH 1 puff  1 puff Inhalation Daily Jonetta Osgood, MD   1 puff at 03/20/18 0931  . HYDROcodone-acetaminophen (NORCO/VICODIN) 5-325 MG per tablet 1-2 tablet  1-2 tablet Oral Q4H PRN Jonetta Osgood, MD   2 tablet at 03/18/18 1937  . ipratropium-albuterol (DUONEB) 0.5-2.5 (3) MG/3ML nebulizer solution 3 mL  3 mL Nebulization Q2H PRN Jonetta Osgood, MD   3 mL at 03/07/18 0856  . ipratropium-albuterol (DUONEB) 0.5-2.5 (3) MG/3ML nebulizer solution 3 mL  3 mL Nebulization TID Amin, Ankit Chirag, MD   3 mL at 03/20/18 0931  . MEDLINE mouth rinse  15 mL Mouth Rinse BID Jonetta Osgood, MD   15 mL at 03/20/18 1017  . multivitamin with minerals tablet 1 tablet  1 tablet Oral Daily Jonetta Osgood, MD   1 tablet at 03/20/18 1017  . senna-docusate (Senokot-S) tablet 1 tablet  1 tablet Oral QHS PRN Ghimire, Henreitta Leber, MD      . sodium chloride flush (NS) 0.9 % injection 10-40 mL  10-40 mL Intracatheter PRN Ghimire, Henreitta Leber, MD      . sodium chloride flush (NS) 0.9 % injection 3 mL  3 mL Intravenous Q12H Ghimire, Henreitta Leber, MD   3 mL at 03/20/18 1017  . sodium chloride flush (NS) 0.9 % injection 3 mL  3 mL Intravenous PRN Ghimire, Henreitta Leber, MD      . traZODone (DESYREL) tablet 50 mg  50 mg Oral QHS PRN Vertis Kelch, NP   50 mg at 03/19/18 2055  . umeclidinium bromide (INCRUSE ELLIPTA) 62.5 MCG/INH 1 puff  1 puff Inhalation Daily Jonetta Osgood, MD   1 puff at 03/20/18 1610     Discharge Medications: Please see discharge summary for a list of discharge medications.  Relevant Imaging Results:  Relevant Lab Results:   Additional Information [phone]  Nila Nephew, LCSW

## 2018-03-20 NOTE — Progress Notes (Signed)
[facility] has accepted pt and CSW spoke with [place] at [place] ([facility]) to initiate insurance authorization. Completed FL2.  Sharren Bridge, MSW, LCSW Clinical Social Work 03/20/2018 [phone]

## 2018-03-20 NOTE — Progress Notes (Signed)
PT Cancellation Note  Patient Details Name: [name] MRN: [id number] DOB: [birth year]   Cancelled Treatment:  Nursing staff reports pt having episodes of diarrhea.  Will check back tomorrow as schedule permits.   Rica Koyanagi  PTA Acute  Rehabilitation Services Pager      [phone] Office      [phone]

## 2018-03-21 ENCOUNTER — Inpatient Hospital Stay (HOSPITAL_COMMUNITY): Payer: Medicare PPO

## 2018-03-21 DIAGNOSIS — I2510 Atherosclerotic heart disease of native coronary artery without angina pectoris: Secondary | ICD-10-CM | POA: Diagnosis not present

## 2018-03-21 DIAGNOSIS — I5022 Chronic systolic (congestive) heart failure: Secondary | ICD-10-CM | POA: Diagnosis not present

## 2018-03-21 DIAGNOSIS — I248 Other forms of acute ischemic heart disease: Secondary | ICD-10-CM | POA: Diagnosis not present

## 2018-03-21 DIAGNOSIS — Z9989 Dependence on other enabling machines and devices: Secondary | ICD-10-CM | POA: Diagnosis not present

## 2018-03-21 DIAGNOSIS — R0602 Shortness of breath: Secondary | ICD-10-CM | POA: Diagnosis not present

## 2018-03-21 DIAGNOSIS — J439 Emphysema, unspecified: Secondary | ICD-10-CM | POA: Diagnosis not present

## 2018-03-21 DIAGNOSIS — G4733 Obstructive sleep apnea (adult) (pediatric): Secondary | ICD-10-CM | POA: Diagnosis not present

## 2018-03-21 DIAGNOSIS — D696 Thrombocytopenia, unspecified: Secondary | ICD-10-CM | POA: Diagnosis not present

## 2018-03-21 DIAGNOSIS — J449 Chronic obstructive pulmonary disease, unspecified: Secondary | ICD-10-CM | POA: Diagnosis not present

## 2018-03-21 DIAGNOSIS — J189 Pneumonia, unspecified organism: Secondary | ICD-10-CM | POA: Diagnosis not present

## 2018-03-21 DIAGNOSIS — I25700 Atherosclerosis of coronary artery bypass graft(s), unspecified, with unstable angina pectoris: Secondary | ICD-10-CM | POA: Diagnosis not present

## 2018-03-21 DIAGNOSIS — Z452 Encounter for adjustment and management of vascular access device: Secondary | ICD-10-CM | POA: Diagnosis not present

## 2018-03-21 DIAGNOSIS — R1311 Dysphagia, oral phase: Secondary | ICD-10-CM | POA: Diagnosis not present

## 2018-03-21 DIAGNOSIS — M2550 Pain in unspecified joint: Secondary | ICD-10-CM | POA: Diagnosis not present

## 2018-03-21 DIAGNOSIS — I714 Abdominal aortic aneurysm, without rupture: Secondary | ICD-10-CM | POA: Diagnosis not present

## 2018-03-21 DIAGNOSIS — C3490 Malignant neoplasm of unspecified part of unspecified bronchus or lung: Secondary | ICD-10-CM | POA: Diagnosis not present

## 2018-03-21 DIAGNOSIS — C3492 Malignant neoplasm of unspecified part of left bronchus or lung: Secondary | ICD-10-CM | POA: Diagnosis not present

## 2018-03-21 DIAGNOSIS — Z955 Presence of coronary angioplasty implant and graft: Secondary | ICD-10-CM | POA: Diagnosis not present

## 2018-03-21 DIAGNOSIS — J9601 Acute respiratory failure with hypoxia: Secondary | ICD-10-CM | POA: Diagnosis not present

## 2018-03-21 DIAGNOSIS — N179 Acute kidney failure, unspecified: Secondary | ICD-10-CM | POA: Diagnosis not present

## 2018-03-21 DIAGNOSIS — J441 Chronic obstructive pulmonary disease with (acute) exacerbation: Secondary | ICD-10-CM | POA: Diagnosis not present

## 2018-03-21 DIAGNOSIS — C801 Malignant (primary) neoplasm, unspecified: Secondary | ICD-10-CM | POA: Diagnosis not present

## 2018-03-21 DIAGNOSIS — I5043 Acute on chronic combined systolic (congestive) and diastolic (congestive) heart failure: Secondary | ICD-10-CM | POA: Diagnosis not present

## 2018-03-21 DIAGNOSIS — G9389 Other specified disorders of brain: Secondary | ICD-10-CM | POA: Diagnosis not present

## 2018-03-21 DIAGNOSIS — Z7401 Bed confinement status: Secondary | ICD-10-CM | POA: Diagnosis not present

## 2018-03-21 LAB — BASIC METABOLIC PANEL
Anion gap: 5 (ref 5–15)
BUN: 12 mg/dL (ref 8–23)
CO2: 27 mmol/L (ref 22–32)
Calcium: 8.6 mg/dL — ABNORMAL LOW (ref 8.9–10.3)
Chloride: 112 mmol/L — ABNORMAL HIGH (ref 98–111)
Creatinine, Ser: 0.84 mg/dL (ref 0.61–1.24)
GFR calc Af Amer: >60 mL/min (ref >60)
GFR calc non Af Amer: >60 mL/min (ref >60)
Glucose, Bld: 98 mg/dL (ref 70–99)
Potassium: 3.4 mmol/L — ABNORMAL LOW (ref 3.5–5.1)
Sodium: 144 mmol/L (ref 135–145)

## 2018-03-21 LAB — CBC WITH DIFFERENTIAL/PLATELET
Abs Immature Granulocytes: 0.77 10*3/uL — ABNORMAL HIGH (ref 0.00–0.07)
Basophils Absolute: 0 10*3/uL (ref 0.0–0.1)
Basophils Relative: 0 %
Eosinophils Absolute: 0 10*3/uL (ref 0.0–0.5)
Eosinophils Relative: 0 %
HCT: 25.5 % — ABNORMAL LOW (ref 39.0–52.0)
Hemoglobin: 7.8 g/dL — ABNORMAL LOW (ref 13.0–17.0)
Immature Granulocytes: 11 %
LYMPHS ABS: 1.4 10*3/uL (ref 0.7–4.0)
Lymphocytes Relative: 20 %
MCH: 29.9 pg (ref 26.0–34.0)
MCHC: 30.6 g/dL (ref 30.0–36.0)
MCV: 97.7 fL (ref 80.0–100.0)
Monocytes Absolute: 1 10*3/uL (ref 0.1–1.0)
Monocytes Relative: 14 %
Neutro Abs: 3.9 10*3/uL (ref 1.7–7.7)
Neutrophils Relative %: 55 %
Platelets: 27 10*3/uL — CL (ref 150–400)
RBC: 2.61 MIL/uL — ABNORMAL LOW (ref 4.22–5.81)
RDW: 15.9 % — ABNORMAL HIGH (ref 11.5–15.5)
WBC: 7.1 10*3/uL (ref 4.0–10.5)
nRBC: 0.9 % — ABNORMAL HIGH (ref 0.0–0.2)

## 2018-03-21 LAB — MAGNESIUM: Magnesium: 1.7 mg/dL (ref 1.7–2.4)

## 2018-03-21 MED ORDER — HYDROCODONE-ACETAMINOPHEN 5-325 MG PO TABS: 1.0000 | ORAL_TABLET | ORAL | 0 refills | Status: DC | PRN

## 2018-03-21 MED ORDER — SODIUM CHLORIDE (PF) 0.9 % IJ SOLN
INTRAMUSCULAR | Status: AC
  Filled 2018-03-21: qty 50

## 2018-03-21 MED ORDER — POTASSIUM CHLORIDE CRYS ER 20 MEQ PO TBCR
40.0000 meq | EXTENDED_RELEASE_TABLET | Freq: Once | ORAL | Status: AC
  Administered 2018-03-21: 40 meq via ORAL
  Filled 2018-03-21: qty 2

## 2018-03-21 MED ORDER — CALCIUM CARBONATE-VITAMIN D 500-200 MG-UNIT PO TABS: 1.0000 | ORAL_TABLET | Freq: Two times a day (BID) | ORAL | 3 refills | Status: DC

## 2018-03-21 MED ORDER — ALBUTEROL SULFATE (2.5 MG/3ML) 0.083% IN NEBU: 2.5000 mg | INHALATION_SOLUTION | Freq: Two times a day (BID) | RESPIRATORY_TRACT | Status: DC

## 2018-03-21 MED ORDER — IOHEXOL 300 MG/ML  SOLN
75.0000 mL | Freq: Once | INTRAMUSCULAR | Status: AC | PRN
  Administered 2018-03-21: 75 mL via INTRAVENOUS

## 2018-03-21 NOTE — Progress Notes (Signed)
IP PROGRESS NOTE  Subjective:   Joseph Berg has no new complaint.  He was out of bed yesterday.  He had 2 episodes of diarrhea yesterday. Objective: Vital signs in last 24 hours: Blood pressure 140/76, pulse 96, temperature 97.9 F (36.6 C), temperature source Oral, resp. rate (!) 25, height 5' 9"  (1.753 m), weight 210 lb (95.3 kg), SpO2 92 %.  Intake/Output from previous day: 01/27 0701 - 01/28 0700 In: 240 [P.O.:240] Out: -   Physical Exam:  HEENT: No thrush or bleeding Lungs: Mild expiratory rhonchi on anterior exam, no respiratory distress Cardiac: Regular rhythm, distant heart sounds Abdomen: No hepatosplenomegaly, nontender Extremities: Trace edema at lower leg and foot bilaterally Skin: Ecchymoses over the extremities are much improved Neurologic: Alert, follows commands   Lab Results: Recent Labs    03/05/18 1636 03/05/18 2221 03/06/18 0341  WBC 7.5  --  7.9  HGB 12.2*  --  11.6*  HCT 37.2*  --  36.1*  PLT 8* 8* 7*  Platelets 27,000, ANC 3.9  Recent Labs    03/05/18 1636 03/05/18 1752 03/06/18 0341  NA 141  --  143  K 2.8*  --  3.2*  CL 104  --  102  CO2 24  --  26  GLUCOSE 106*  --  113*  BUN 14  --  15  CREATININE 1.45* 1.40* 1.58*  CALCIUM 9.7  --  10.5*  Creatinine 0. 84, potassium 3.4 Dg Chest 2 View  Result Date: 03/05/2018 CLINICAL DATA:  Short of breath.  Recent pneumonia EXAM: CHEST - 2 VIEW COMPARISON:  None. FINDINGS: Sternotomy wires overlie normal cardiac silhouette. Normal pulmonary vasculature. No effusion, infiltrate, or pneumothorax. No acute osseous abnormality. IMPRESSION: No acute cardiopulmonary process. Electronically Signed   By: Suzy Bouchard M.D.   On: 03/05/2018 15:11   Ct Angio Chest/abd/pel For Dissection W And/or W/wo  Addendum Date: 03/05/2018   ADDENDUM REPORT: 03/05/2018 20:17 ADDENDUM: Correction: There are several omissions in the body of the chest CT report due to recent change from table mike to speech  microphone. Under cardiovascular, it should state NO large central pulmonary embolus is noted. And likewise NO thoracic aortic aneurysm is identified. Under lungs/pleura common should state: As stated above, there is pulmonary consolidation in the superior segment of the left lower lobe abutting the pleura. A small right effusion and atelectasis is noted. Centrilobular emphysema is noted, upper lobe predominant. Findings discussed with Joseph Berg. Electronically Signed   By: Ashley Royalty M.D.   On: 03/05/2018 20:17   Result Date: 03/05/2018 CLINICAL DATA:  Dyspnea on exertion. Recently diagnosed 12 5 discharge 57 EXAM: CT ANGIOGRAPHY CHEST, ABDOMEN AND PELVIS TECHNIQUE: Multidetector CT imaging through the chest, abdomen and pelvis was performed using the standard protocol during bolus administration of intravenous contrast. Multiplanar reconstructed images and MIPs were obtained and reviewed to evaluate the vascular anatomy. CONTRAST:  115m ISOVUE-370 IOPAMIDOL (ISOVUE-370) INJECTION 76% COMPARISON:  None. FINDINGS: CTA CHEST FINDINGS Cardiovascular: The unenhanced images through the chest demonstrate. Atherosclerosis of the thoracic aorta branch vessels are. Native main three-vessel coronary arteriosclerosis is noted with post CABG change present heart size is top normal effusion After IV contrast administration thoracic aortic aneurysm is identified. Linear streak artifacts are noted within the common carotid and subclavian veins emanating hyperdense contrast the adjacent left subclavian conclusive evidence dissection. Large central embolus is noted though the study is not tailored toward assessment emboli Mediastinum/Nodes: Normal sized thyroid gland without dominant mass. Mildly enlarged left  tracheobronchial lymph nodes measuring up to 12 mm short axis larger 2 cm short axis left hilar soft tissue density/lymph nodes noted. These may be reactive given what appear to pulmonary consolidations in the  superior segment of the left lower lobe compatible with pneumonia and/or atelectasis. Patent trachea and mainstem bronchi. The CT appearance of the esophagus is unremarkable. Lungs/Pleura: As stated above, there is pulmonary consolidation in superior segment of the lower lobe abutting the small right effusion and atelectasis. Centrilobular emphysema is, upper lobe. No pneumothorax. Musculoskeletal: No chest wall abnormality. No acute or significant osseous findings. Mild degenerative change along the thoracic spine. Median sternotomy sutures are in place. Review of the MIP images confirms the above findings. CTA ABDOMEN AND PELVIS FINDINGS VASCULAR Aorta: 4.5 cm infrarenal abdominal aortic aneurysm crescentic soft plaque along the posterior and left lateral wall series 8/92. Dissection. Celiac: Minimal atherosclerosis at the origin celiac axis patent branch vessels with atherosclerosis along the splenic artery dissection, aneurysm or significant stenosis. SMA: Patent with atherosclerotic origins. Occlusion or dissection aneurysm Renals: Single bilateral renal arteries atherosclerosis but fibromuscular dysplasia, stenosis, aneurysm or dissection identified. IMA: Patent Inflow: Patent without evidence of aneurysm, dissection, vasculitis or significant stenosis. Atherosclerosis common iliac arteries probable chronic calcified dissection involving the proximal left common iliac artery, series 8/233. Veins: No obvious venous abnormality within the limitations of this arterial phase study. Review of the MIP images confirms the above findings. NON-VASCULAR Hepatobiliary: No focal liver abnormality is seen. No gallstones, gallbladder wall thickening, or biliary dilatation. Pancreas: Mild ectasia of the pancreatic duct. No inflammation or dominant enhancing mass. Spleen: Normal Adrenals/Urinary Tract: Normal bilateral adrenal glands. Bilateral renal cysts without obstructive uropathy. No solid enhancing masses are noted. The  dependent layering bladder calculi are identified. Diverticulosis of the bladder is also noted at the dome of the bladder. Stomach/Bowel: Decompressed stomach with normal duodenal sweep and ligament of Treitz position. No acute bowel obstruction. Extensive colonic diverticulosis is noted along the descending sigmoid colon. Suggestion mild pericolonic inflammation along the mid sigmoid raises the possibility of a subtle sigmoid diverticulitis, series 8/250. Lymphatic: No chest, abdomen or pelvic adenopathy. Reproductive: Mildly enlarged prostate with peripheral zone calcifications. Other: No free air or free fluid. Musculoskeletal: Lumbar spondylosis with multilevel degenerative disc and endplate changes. Schmorl's node involving the superior endplate of L3. Review of the MIP images confirms the above findings. IMPRESSION: 1. 4.5 cm infrarenal abdominal aortic aneurysm with crescentic soft plaque along the posterior and left lateral wall. Patent branch vessels with atherosclerosis 2. No thoracic aortic aneurysm or dissection. No acute pulmonary embolus. 3. Left lower lobe pneumonia with with mediastinal and left hilar reactive adenopathy. Follow-up to assure resolution is suggested. 4. Bilateral renal cysts. 5. Descending and sigmoid colonic diverticulosis with subtle pericolonic fatty induration along the mid sigmoid raises the possibility of a mild uncomplicated sigmoid diverticulitis. Electronically Signed: By: Ashley Royalty M.D. On: 03/05/2018 19:57    Medications: I have reviewed the patient's current medications.  Assessment/Plan:  1.    Extensive stage small cell lung cancer  CT chest 03/05/2018-left hilar mass, small mediastinal lymph nodes, atelectasis versus consolidation versus mass in the superior segment of the left lower lobe  Bone marrow biopsy 03/07/2018-extensive involvement of the bone marrow with metastatic small cell carcinoma consistent with a lung primary, small foci of non-small cell  differentiation  Cycle 1 etoposide/carboplatin 03/09/2018, G-CSF started 03/12/2018 and discontinued 03/20/2018 2.  Thrombocytopenia secondary to #1-improved 3.  COPD 4.  History of coronary  artery disease 5.  CHF 6.  BPH 7.  Admission with pneumonia December 2019 8.  Mild renal insufficiency-improved 9.  Elevated liver enzymes 10.  Coagulopathy 11.  History of mild elevation of the calcium level 12.  Anemia/leukopenia secondary to small cell lung cancer and chemotherapy  Mr. Stencel is now at day 13 following cycle 1 etoposide/carboplatin.  The white count and platelets are recovering.  He is stable for discharge from an oncology standpoint.  He needs skilled nursing facility placement for physical therapy.  Recommendations: 1.  Staging brain CT 2.  Skilled nursing facility placement for physical therapy 3.  We will arrange for a CBC via the skilled nursing facility next week, outpatient follow-up will be scheduled at the Cancer center 4.  Leave PICC in place at discharge     LOS: 1 day   Joseph Coder, MD   03/06/2018, 8:55 AM

## 2018-03-21 NOTE — Progress Notes (Signed)
Per Dr Benay Spice patient is to be discharged with PICC.

## 2018-03-21 NOTE — Care Management Important Message (Signed)
Important Message  Patient Details  Name: MAHER SHON MRN: [id number] Date of Birth: [birth year]   Medicare Important Message Given:  Yes    Kerin Salen 03/21/2018, 10:14 AMImportant Message  Patient Details  Name: BRYLEY KOVACEVIC MRN: [id number] Date of Birth: [birth year]   Medicare Important Message Given:  Yes    Kerin Salen 03/21/2018, 10:14 AM

## 2018-03-21 NOTE — Discharge Summary (Signed)
Physician Discharge Summary  Joseph Berg IYM:[id number] DOB: 1933/02/22 DOA: 03/05/2018  PCP: [name], MD  Admit date: 03/05/2018 Discharge date: 03/21/2018  Admitted From: Home  Disposition: Skilled nursing facility Recommendations for Outpatient Follow-up:  1. Follow up with PCP in 1-2 weeks 2. Please obtain BMP/CBC 2/14 3. Follow-up with Dr. Benay Spice in 2 weeks  Home Health: None Equipment/Devices: None  Discharge Condition stable CODE STATUS full code Diet recommendation: Cardiac  Brief/Interim Summary:85 year old with history of COPD, chronic systolic congestive heart failure, coronary artery disease, thrombocytopenia initially admitted to [facility] for left lower lobe pneumonia with severe thrombocytopenia.  Subsequently underwent bone marrow biopsy which showed concerns for small cell carcinoma-likely lung with bone marrow involvement.  Patient was transferred to [facility] long for chemotherapy.  He was started on carboplatin and etoposide on 1/31.  Patient was off-and-on diuresed and received multiple units of PRBC and platelet transfusion.  His platelet count and hemoglobin has been up and down.  Mentation is better.  Significantly weak therefore finally after detailed conversation with the family and the wife, they are agreeable to skilled nursing facility.  During the hospitalization was also treated for left lower lobe pneumonia with 7 days of Rocephin.   Discharge Diagnoses:  Principal Problem:   PNA (pneumonia) Active Problems:   CAD (coronary artery disease) of artery bypass graft   Chronic systolic congestive heart failure (HCC)   AAA (abdominal aortic aneurysm) without rupture (HCC)   Acute respiratory failure with hypoxia (HCC)   Diverticulitis possible   Thrombocytopenia (HCC)   Small cell carcinoma (HCC)     Small cell carcinoma of the lung Mild acute respiratory distress with hypoxia, still on 2-3 L nasal cannula -Continue bronchodilator  treatment, encourage incentive spirometry and flutter valve.  Mentation is back to baseline - Patient does have right arm PICC line.Has been in place since 1/30.This was placed per oncology for blood draws and chemotherapy.  Plans to leave the PICC line and at the time of discharge. -Chemotherapy per oncology team  Severe thrombocytopenia and anemia patient received 5 units of platelet transfusion on the day of discharge his platelets is 27.  He also received 2 units of packed RBCs his hemoglobin on the day of discharge is 7.8.  Left lower lobe pneumonia -Streptococcal antigen positive, completed 7 days of Rocephin -Respiratory viral panel negative.    Chronic systolic congestive heart failure -Continue Coreg, Lasix stable  Coronary artery disease status post CABG -Denies any chest pain, appears to be stable.    Obstructive sleep apnea -Unfortunately patient is off-and-on noncompliant with his CPAP.  Acute kidney injury, improved -Improved, trended down to 1.05  PICC line PICC line was placed 03/08/2018 patient will be discharged with PICC line in place please continue PICC line care at the nursing home.  For his generalized weakness he will need skilled nursing facility placement  Nutrition Problem: Increased nutrient needs Etiology: cancer and cancer related treatments    Signs/Symptoms: estimated needs     Interventions: Ensure Enlive (each supplement provides 350kcal and 20 grams of protein)  Estimated body mass index is 35.16 kg/m as calculated from the following:   Height as of this encounter: 5' 9"  (1.753 m).   Weight as of this encounter: 108 kg.  Discharge Instructions   Allergies as of 03/21/2018      Reactions   Hydrochlorothiazide Other (See Comments)   Affected renal function.  "affected kidneys and calcium level"      Medication List  STOP taking these medications   acetaminophen 500 MG tablet Commonly known as:  TYLENOL   aspirin 81 MG  tablet   VITAMIN D PO     TAKE these medications   AMBULATORY NON FORMULARY MEDICATION Nebulizer - use as needed - with supplies.  Diagnosis of COPD J44.1   AMBULATORY NON FORMULARY MEDICATION Knee-high, medium compression, graduated compression stockings. Apply to lower extremities. [email or web address], Zippered Compression Stockings, medium circ, long length   calcium-vitamin D 500-200 MG-UNIT tablet Commonly known as:  OSCAL 500/200 D-3 Take 1 tablet by mouth 2 (two) times daily.   carvedilol 12.5 MG tablet Commonly known as:  COREG Take 1 tablet (12.5 mg total) by mouth 2 (two) times daily with a meal.   cilostazol 100 MG tablet Commonly known as:  PLETAL Take 1 tablet (100 mg total) by mouth 2 (two) times daily.   finasteride 5 MG tablet Commonly known as:  PROSCAR TAKE 1 TABLET BY MOUTH EVERY DAY What changed:  when to take this   Fluticasone-Umeclidin-Vilant 100-62.5-25 MCG/INH Aepb Commonly known as:  TRELEGY ELLIPTA Inhale 1 Inhaler into the lungs daily.   furosemide 40 MG tablet Commonly known as:  LASIX Take 1 tablet (40 mg total) by mouth daily.   HYDROcodone-acetaminophen 5-325 MG tablet Commonly known as:  NORCO/VICODIN Take 1-2 tablets by mouth every 4 (four) hours as needed for moderate pain.   ipratropium-albuterol 0.5-2.5 (3) MG/3ML Soln Commonly known as:  DUONEB Take 3 mLs by nebulization every 2 (two) hours as needed (for SOB).   multivitamin capsule Take 1 capsule by mouth daily.   Nebulizer Misc Generic nebulizer plus supplies   rosuvastatin 40 MG tablet Commonly known as:  CRESTOR Take 1 tablet (40 mg total) by mouth daily.   senna-docusate 8.6-50 MG tablet Commonly known as:  Senokot-S Take 1 tablet by mouth at bedtime as needed for mild constipation.   TRUBIOTICS PO Take 1 tablet by mouth daily.   VENTOLIN HFA 108 (90 Base) MCG/ACT inhaler Generic drug:  albuterol TAKE 2 PUFFS BY MOUTH EVERY 6 HOURS AS NEEDED FOR WHEEZE OR  SHORTNESS OF BREATH What changed:  See the new instructions.   VITAMIN B-12 PO Take 1 tablet by mouth daily.   vitamin C 1000 MG tablet Take 1,000 mg by mouth daily.      Contact information for after-discharge care    [facility] .   Service:  Skilled Nursing Contact information: [address] [place] [phone]             Allergies  Allergen Reactions  . Hydrochlorothiazide Other (See Comments)    Affected renal function.  "affected kidneys and calcium level"    Consultations:   Procedures/Studies: Dg Chest 2 View  Result Date: 03/05/2018 CLINICAL DATA:  Short of breath.  Recent pneumonia EXAM: CHEST - 2 VIEW COMPARISON:  None. FINDINGS: Sternotomy wires overlie normal cardiac silhouette. Normal pulmonary vasculature. No effusion, infiltrate, or pneumothorax. No acute osseous abnormality. IMPRESSION: No acute cardiopulmonary process. Electronically Signed   By: Suzy Bouchard M.D.   On: 03/05/2018 15:11   Dg Chest Port 1 View  Result Date: 03/14/2018 CLINICAL DATA:  Increased shortness of breath EXAM: PORTABLE CHEST 1 VIEW COMPARISON:  03/05/2018 FINDINGS: Cardiac shadow remains enlarged. Postsurgical changes are again seen. Right-sided PICC line is noted in satisfactory position in the distal superior vena cava. Chronic blunting of left costophrenic angle is noted. No focal infiltrate  or effusion is seen. No bony abnormality is noted. IMPRESSION: Chronic left basilar changes. Right PICC line in satisfactory position. Electronically Signed   By: Inez Catalina M.D.   On: 03/14/2018 10:06   Ct Bone Marrow Biopsy & Aspiration  Result Date: 03/07/2018 CLINICAL DATA:  Thrombocytopenia EXAM: CT GUIDED DEEP ILIAC BONE ASPIRATION AND CORE BIOPSY TECHNIQUE: Patient was placed prone on the CT gantry and limited axial scans through the pelvis were obtained. Appropriate skin entry site was identified.  Skin site was marked, prepped with chlorhexidine, draped in usual sterile fashion, and infiltrated locally with 1% lidocaine. Intravenous Fentanyl and Versed were administered as conscious sedation during continuous monitoring of the patient's level of consciousness and physiological / cardiorespiratory status by the radiology RN, with a total moderate sedation time of 10 minutes. Under CT fluoroscopic guidance an 11-gauge Cook trocar bone needle was advanced into the right iliac bone just lateral to the sacroiliac joint. Once needle tip position was confirmed, core and aspiration samples were obtained, submitted to pathology for approval. Post procedure scans show no hematoma or fracture. Patient tolerated procedure well. COMPLICATIONS: COMPLICATIONS none IMPRESSION: 1. Technically successful CT guided right iliac bone core and aspiration biopsy. Electronically Signed   By: Lucrezia Europe M.D.   On: 03/07/2018 15:25   Ct Angio Chest/abd/pel For Dissection W And/or W/wo  Addendum Date: 03/05/2018   ADDENDUM REPORT: 03/05/2018 20:17 ADDENDUM: Correction: There are several omissions in the body of the chest CT report due to recent change from table mike to speech microphone. Under cardiovascular, it should state NO large central pulmonary embolus is noted. And likewise NO thoracic aortic aneurysm is identified. Under lungs/pleura common should state: As stated above, there is pulmonary consolidation in the superior segment of the left lower lobe abutting the pleura. A small right effusion and atelectasis is noted. Centrilobular emphysema is noted, upper lobe predominant. Findings discussed with Wyn Quaker. Electronically Signed   By: Ashley Royalty M.D.   On: 03/05/2018 20:17   Result Date: 03/05/2018 CLINICAL DATA:  Dyspnea on exertion. Recently diagnosed 12 5 discharge 10 EXAM: CT ANGIOGRAPHY CHEST, ABDOMEN AND PELVIS TECHNIQUE: Multidetector CT imaging through the chest, abdomen and pelvis was performed using  the standard protocol during bolus administration of intravenous contrast. Multiplanar reconstructed images and MIPs were obtained and reviewed to evaluate the vascular anatomy. CONTRAST:  175m ISOVUE-370 IOPAMIDOL (ISOVUE-370) INJECTION 76% COMPARISON:  None. FINDINGS: CTA CHEST FINDINGS Cardiovascular: The unenhanced images through the chest demonstrate. Atherosclerosis of the thoracic aorta branch vessels are. Native main three-vessel coronary arteriosclerosis is noted with post CABG change present heart size is top normal effusion After IV contrast administration thoracic aortic aneurysm is identified. Linear streak artifacts are noted within the common carotid and subclavian veins emanating hyperdense contrast the adjacent left subclavian conclusive evidence dissection. Large central embolus is noted though the study is not tailored toward assessment emboli Mediastinum/Nodes: Normal sized thyroid gland without dominant mass. Mildly enlarged left tracheobronchial lymph nodes measuring up to 12 mm short axis larger 2 cm short axis left hilar soft tissue density/lymph nodes noted. These may be reactive given what appear to pulmonary consolidations in the superior segment of the left lower lobe compatible with pneumonia and/or atelectasis. Patent trachea and mainstem bronchi. The CT appearance of the esophagus is unremarkable. Lungs/Pleura: As stated above, there is pulmonary consolidation in superior segment of the lower lobe abutting the small right effusion and atelectasis. Centrilobular emphysema is, upper lobe. No pneumothorax. Musculoskeletal: No chest  wall abnormality. No acute or significant osseous findings. Mild degenerative change along the thoracic spine. Median sternotomy sutures are in place. Review of the MIP images confirms the above findings. CTA ABDOMEN AND PELVIS FINDINGS VASCULAR Aorta: 4.5 cm infrarenal abdominal aortic aneurysm crescentic soft plaque along the posterior and left lateral wall  series 8/92. Dissection. Celiac: Minimal atherosclerosis at the origin celiac axis patent branch vessels with atherosclerosis along the splenic artery dissection, aneurysm or significant stenosis. SMA: Patent with atherosclerotic origins. Occlusion or dissection aneurysm Renals: Single bilateral renal arteries atherosclerosis but fibromuscular dysplasia, stenosis, aneurysm or dissection identified. IMA: Patent Inflow: Patent without evidence of aneurysm, dissection, vasculitis or significant stenosis. Atherosclerosis common iliac arteries probable chronic calcified dissection involving the proximal left common iliac artery, series 8/233. Veins: No obvious venous abnormality within the limitations of this arterial phase study. Review of the MIP images confirms the above findings. NON-VASCULAR Hepatobiliary: No focal liver abnormality is seen. No gallstones, gallbladder wall thickening, or biliary dilatation. Pancreas: Mild ectasia of the pancreatic duct. No inflammation or dominant enhancing mass. Spleen: Normal Adrenals/Urinary Tract: Normal bilateral adrenal glands. Bilateral renal cysts without obstructive uropathy. No solid enhancing masses are noted. The dependent layering bladder calculi are identified. Diverticulosis of the bladder is also noted at the dome of the bladder. Stomach/Bowel: Decompressed stomach with normal duodenal sweep and ligament of Treitz position. No acute bowel obstruction. Extensive colonic diverticulosis is noted along the descending sigmoid colon. Suggestion mild pericolonic inflammation along the mid sigmoid raises the possibility of a subtle sigmoid diverticulitis, series 8/250. Lymphatic: No chest, abdomen or pelvic adenopathy. Reproductive: Mildly enlarged prostate with peripheral zone calcifications. Other: No free air or free fluid. Musculoskeletal: Lumbar spondylosis with multilevel degenerative disc and endplate changes. Schmorl's node involving the superior endplate of L3.  Review of the MIP images confirms the above findings. IMPRESSION: 1. 4.5 cm infrarenal abdominal aortic aneurysm with crescentic soft plaque along the posterior and left lateral wall. Patent branch vessels with atherosclerosis 2. No thoracic aortic aneurysm or dissection. No acute pulmonary embolus. 3. Left lower lobe pneumonia with with mediastinal and left hilar reactive adenopathy. Follow-up to assure resolution is suggested. 4. Bilateral renal cysts. 5. Descending and sigmoid colonic diverticulosis with subtle pericolonic fatty induration along the mid sigmoid raises the possibility of a mild uncomplicated sigmoid diverticulitis. Electronically Signed: By: Ashley Royalty M.D. On: 03/05/2018 19:57   [place] Ekg Site Rite  Result Date: 03/08/2018 If Site Rite image not attached, placement could not be confirmed due to current cardiac rhythm.   (Echo, Carotid, EGD, Colonoscopy, ERCP)    Subjective: Patient resting in bed wife at the bedside anxious to be discharged no new complaints no nausea vomiting diarrhea fever or chills reported Discharge Exam: Vitals:   03/21/18 0424 03/21/18 0730  BP: (!) 154/70   Pulse: 71 73  Resp: 16 17  Temp: 98.2 F (36.8 C)   SpO2: 95% 97%   Vitals:   03/20/18 1928 03/20/18 2105 03/21/18 0424 03/21/18 0730  BP:  136/69 (!) 154/70   Pulse:  68 71 73  Resp:  17 16 17   Temp:  98.4 F (36.9 C) 98.2 F (36.8 C)   TempSrc:  Oral Oral   SpO2: 96% 91% 95% 97%  Weight:      Height:        General: Pt is alert, awake, not in acute distress Cardiovascular: RRR, S1/S2 +, no rubs, no gallops Respiratory: CTA bilaterally, no wheezing, no rhonchi Abdominal: Soft, NT,  ND, bowel sounds + Extremities: no edema, no cyanosis    The results of significant diagnostics from this hospitalization (including imaging, microbiology, ancillary and laboratory) are listed below for reference.     Microbiology: No results found for this or any previous visit (from the past 240  hour(s)).   Labs: BNP (last 3 results) Recent Labs    07/04/17 1440 01/11/18 0935 03/15/18 0511  BNP 94 296.6* 646.8*   Basic Metabolic Panel: Recent Labs  Lab 03/17/18 0512 03/18/18 0403 03/19/18 0514 03/20/18 0544 03/21/18 0510  NA 145 145 144 145 144  K 3.6 3.7 3.9 3.5 3.4*  CL 115* 113* 113* 112* 112*  CO2 26 26 27 27 27   GLUCOSE 115* 100* 91 100* 98  BUN 26* 22 17 15 12   CREATININE 1.05 0.93 0.95 0.88 0.84  CALCIUM 8.7* 8.8* 8.6* 8.6* 8.6*  MG 1.9 1.7 1.8 1.8 1.7   Liver Function Tests: Recent Labs  Lab 03/15/18 0511 03/17/18 0512 03/20/18 0544  AST 151* 91* 58*  ALT 76* 70* 60*  ALKPHOS 270* 216* 186*  BILITOT 2.0* 1.7* 1.5*  PROT 4.9* 4.7* 4.7*  ALBUMIN 2.5* 2.5* 2.7*   No results for input(s): LIPASE, AMYLASE in the last 168 hours. Recent Labs  Lab 03/14/18 1028  AMMONIA 57*   CBC: Recent Labs  Lab 03/17/18 0512 03/18/18 0403 03/19/18 0514 03/20/18 0544 03/21/18 0510  WBC 1.8* 1.9* 2.3* 4.5 7.1  NEUTROABS 0.5* 0.5* 0.6* 2.1 3.9  HGB 6.7* 8.7* 7.7* 7.9* 7.8*  HCT 22.0* 27.3* 25.0* 25.8* 25.5*  MCV 97.8 93.8 96.2 97.7 97.7  PLT 13* 11* 27* 26* 27*   Cardiac Enzymes: No results for input(s): CKTOTAL, CKMB, CKMBINDEX, TROPONINI in the last 168 hours. BNP: Invalid input(s): POCBNP CBG: No results for input(s): GLUCAP in the last 168 hours. D-Dimer No results for input(s): DDIMER in the last 72 hours. Hgb A1c No results for input(s): HGBA1C in the last 72 hours. Lipid Profile No results for input(s): CHOL, HDL, LDLCALC, TRIG, CHOLHDL, LDLDIRECT in the last 72 hours. Thyroid function studies No results for input(s): TSH, T4TOTAL, T3FREE, THYROIDAB in the last 72 hours.  Invalid input(s): FREET3 Anemia work up No results for input(s): VITAMINB12, FOLATE, FERRITIN, TIBC, IRON, RETICCTPCT in the last 72 hours. Urinalysis    Component Value Date/Time   COLORURINE YELLOW 03/05/2018 2038   APPEARANCEUR CLEAR 03/05/2018 2038   LABSPEC  1.023 03/05/2018 2038   PHURINE 6.0 03/05/2018 2038   GLUCOSEU NEGATIVE 03/05/2018 2038   HGBUR MODERATE (A) 03/05/2018 2038   BILIRUBINUR NEGATIVE 03/05/2018 2038   BILIRUBINUR neg 10/31/2011 [place] 03/05/2018 2038   PROTEINUR NEGATIVE 03/05/2018 2038   UROBILINOGEN 2.0 (H) 08/09/2012 1316   NITRITE NEGATIVE 03/05/2018 2038   LEUKOCYTESUR NEGATIVE 03/05/2018 2038   Sepsis Labs Invalid input(s): PROCALCITONIN,  WBC,  LACTICIDVEN Microbiology No results found for this or any previous visit (from the past 240 hour(s)).   Time coordinating discharge:  36 minutes  SIGNED:   Georgette Shell, MD  [facility] 03/21/2018, 9:28 AM Pager   If 7PM-7AM, please contact night-coverage [email or web address] Password [email or web address]

## 2018-03-21 NOTE — Progress Notes (Signed)
Report called to [place] @ at [facility]. All questions and concerns addressed. Patient transported via [place].

## 2018-03-21 NOTE — Progress Notes (Signed)
Chaplain provided support around advance directive.   Pt completed [place].   Copy placed on pt chart.  Pt with original and copies.    Provided support and prayers around discharge to rehab.

## 2018-03-21 NOTE — Clinical Social Work Placement (Signed)
Pt admitting to [facility] SN[facility] for rehabilitation- [employer] Bernadene Bell) approved admission today Report [phone]- 300/400 hall RN station Wife Helene Kelp at Bedside aware- states she is needing to finish HPOA paperwork prior to leaving hospital- CSW will arrange transportation    [place]  NOTE  Date:  03/21/2018  Patient Details  Name: Joseph Berg MRN: [id number] Date of Birth: 02-20-1933  Clinical Social Work is seeking post-discharge placement for this patient at the [place] level of care (*CSW will initial, date and re-position this form in  chart as items are completed):  Yes   Patient/family provided with [place] Work Department's list of facilities offering this level of care within the geographic area requested by the patient (or if unable, by the patient's family).  Yes   Patient/family informed of their freedom to choose among providers that offer the needed level of care, that participate in Medicare, Medicaid or managed care program needed by the patient, have an available bed and are willing to accept the patient.  Yes   Patient/family informed of [facility]'s ownership interest in [facility] and [facility], as well as of the fact that they are under no obligation to receive care at these facilities.  PASRR submitted to EDS on 03/19/18     PASRR number received on 03/19/18     Existing PASRR number confirmed on       FL2 transmitted to all facilities in geographic area requested by pt/family on 03/19/18     FL2 transmitted to all facilities within larger geographic area on       Patient informed that his/her managed care company has contracts with or will negotiate with certain facilities, including the following:        Yes   Patient/family informed of bed offers received.  Patient chooses bed at [facility])     Physician recommends and patient chooses bed at [facility])      Patient to be [facility] to [facility] on 03/21/18.  Patient to be transferred to facility by [facility]     Patient family notified on 03/21/18 of transfer.  Name of family member notified:  wife [facility]     PHYSICIAN       Additional Comment:    _______________________________________________ Nila Nephew, LCSW 03/21/2018, 1:36 PM [phone]

## 2018-03-23 DIAGNOSIS — I5022 Chronic systolic (congestive) heart failure: Secondary | ICD-10-CM | POA: Diagnosis not present

## 2018-03-23 DIAGNOSIS — J189 Pneumonia, unspecified organism: Secondary | ICD-10-CM | POA: Diagnosis not present

## 2018-03-23 DIAGNOSIS — D696 Thrombocytopenia, unspecified: Secondary | ICD-10-CM | POA: Diagnosis not present

## 2018-03-23 DIAGNOSIS — C3492 Malignant neoplasm of unspecified part of left bronchus or lung: Secondary | ICD-10-CM | POA: Diagnosis not present

## 2018-03-26 DIAGNOSIS — C3492 Malignant neoplasm of unspecified part of left bronchus or lung: Secondary | ICD-10-CM | POA: Diagnosis not present

## 2018-03-26 DIAGNOSIS — I5022 Chronic systolic (congestive) heart failure: Secondary | ICD-10-CM | POA: Diagnosis not present

## 2018-03-26 DIAGNOSIS — J189 Pneumonia, unspecified organism: Secondary | ICD-10-CM | POA: Diagnosis not present

## 2018-03-26 DIAGNOSIS — D696 Thrombocytopenia, unspecified: Secondary | ICD-10-CM | POA: Diagnosis not present

## 2018-03-28 ENCOUNTER — Telehealth: Payer: Self-pay | Admitting: *Deleted

## 2018-03-28 DIAGNOSIS — C3492 Malignant neoplasm of unspecified part of left bronchus or lung: Secondary | ICD-10-CM | POA: Diagnosis not present

## 2018-03-28 DIAGNOSIS — J189 Pneumonia, unspecified organism: Secondary | ICD-10-CM | POA: Diagnosis not present

## 2018-03-28 DIAGNOSIS — J9601 Acute respiratory failure with hypoxia: Secondary | ICD-10-CM | POA: Diagnosis not present

## 2018-03-28 DIAGNOSIS — N179 Acute kidney failure, unspecified: Secondary | ICD-10-CM | POA: Diagnosis not present

## 2018-03-28 NOTE — Telephone Encounter (Signed)
Spoke with nurse, Tiara and requested CBC results from 03/26/18 be faxed to office. Confirmed that they do provide PICC line care in house. Spoke with Solicitor, Destiny and provided her date/time of next appointment with Dr. Benay Spice. Faxed February appointment calendar to facility. Counts show much improvement from 03/21/18 with Hgb now 9.5. Dr. Benay Spice notified.

## 2018-03-29 DIAGNOSIS — D696 Thrombocytopenia, unspecified: Secondary | ICD-10-CM | POA: Diagnosis not present

## 2018-03-29 DIAGNOSIS — G4733 Obstructive sleep apnea (adult) (pediatric): Secondary | ICD-10-CM | POA: Diagnosis not present

## 2018-03-29 DIAGNOSIS — I5022 Chronic systolic (congestive) heart failure: Secondary | ICD-10-CM | POA: Diagnosis not present

## 2018-03-29 DIAGNOSIS — C3492 Malignant neoplasm of unspecified part of left bronchus or lung: Secondary | ICD-10-CM | POA: Diagnosis not present

## 2018-03-30 ENCOUNTER — Telehealth: Payer: Self-pay | Admitting: *Deleted

## 2018-03-30 NOTE — Telephone Encounter (Signed)
Wife called to ask when his next visit/treatment appointment is. SNF did not tell her and she brought him home today. Notified her of appointments on 04/03/18 at [place] and times for 2/26 and 2/27. Inquired if his PICC was flushed before he left today and she is unsure. Called facility and left message for 300/400 Hall nurse to call back with date of last PICC flush.

## 2018-03-31 DIAGNOSIS — I5022 Chronic systolic (congestive) heart failure: Secondary | ICD-10-CM | POA: Diagnosis not present

## 2018-03-31 DIAGNOSIS — C7951 Secondary malignant neoplasm of bone: Secondary | ICD-10-CM | POA: Diagnosis not present

## 2018-03-31 DIAGNOSIS — I110 Hypertensive heart disease with heart failure: Secondary | ICD-10-CM | POA: Diagnosis not present

## 2018-03-31 DIAGNOSIS — R1311 Dysphagia, oral phase: Secondary | ICD-10-CM | POA: Diagnosis not present

## 2018-03-31 DIAGNOSIS — J9691 Respiratory failure, unspecified with hypoxia: Secondary | ICD-10-CM | POA: Diagnosis not present

## 2018-03-31 DIAGNOSIS — I2510 Atherosclerotic heart disease of native coronary artery without angina pectoris: Secondary | ICD-10-CM | POA: Diagnosis not present

## 2018-03-31 DIAGNOSIS — M47815 Spondylosis without myelopathy or radiculopathy, thoracolumbar region: Secondary | ICD-10-CM | POA: Diagnosis not present

## 2018-03-31 DIAGNOSIS — J439 Emphysema, unspecified: Secondary | ICD-10-CM | POA: Diagnosis not present

## 2018-03-31 DIAGNOSIS — C3490 Malignant neoplasm of unspecified part of unspecified bronchus or lung: Secondary | ICD-10-CM | POA: Diagnosis not present

## 2018-04-01 ENCOUNTER — Other Ambulatory Visit: Payer: Self-pay | Admitting: Oncology

## 2018-04-02 DIAGNOSIS — G4733 Obstructive sleep apnea (adult) (pediatric): Secondary | ICD-10-CM | POA: Diagnosis not present

## 2018-04-03 ENCOUNTER — Inpatient Hospital Stay: Payer: Medicare PPO

## 2018-04-03 ENCOUNTER — Telehealth: Payer: Self-pay | Admitting: Oncology

## 2018-04-03 ENCOUNTER — Telehealth: Payer: Self-pay | Admitting: *Deleted

## 2018-04-03 ENCOUNTER — Inpatient Hospital Stay: Payer: Medicare PPO | Attending: Oncology

## 2018-04-03 ENCOUNTER — Inpatient Hospital Stay: Payer: Medicare PPO | Admitting: Oncology

## 2018-04-03 VITALS — BP 121/80 | HR 77 | Temp 97.7°F | Resp 19 | Ht 69.0 in | Wt 209.8 lb

## 2018-04-03 DIAGNOSIS — C801 Malignant (primary) neoplasm, unspecified: Secondary | ICD-10-CM

## 2018-04-03 DIAGNOSIS — C3432 Malignant neoplasm of lower lobe, left bronchus or lung: Secondary | ICD-10-CM

## 2018-04-03 DIAGNOSIS — D6959 Other secondary thrombocytopenia: Secondary | ICD-10-CM

## 2018-04-03 DIAGNOSIS — J449 Chronic obstructive pulmonary disease, unspecified: Secondary | ICD-10-CM | POA: Diagnosis not present

## 2018-04-03 DIAGNOSIS — D701 Agranulocytosis secondary to cancer chemotherapy: Secondary | ICD-10-CM | POA: Diagnosis not present

## 2018-04-03 DIAGNOSIS — E876 Hypokalemia: Secondary | ICD-10-CM | POA: Diagnosis not present

## 2018-04-03 DIAGNOSIS — I509 Heart failure, unspecified: Secondary | ICD-10-CM

## 2018-04-03 DIAGNOSIS — Z5189 Encounter for other specified aftercare: Secondary | ICD-10-CM | POA: Insufficient documentation

## 2018-04-03 DIAGNOSIS — N289 Disorder of kidney and ureter, unspecified: Secondary | ICD-10-CM

## 2018-04-03 DIAGNOSIS — C7952 Secondary malignant neoplasm of bone marrow: Secondary | ICD-10-CM | POA: Insufficient documentation

## 2018-04-03 DIAGNOSIS — Z95828 Presence of other vascular implants and grafts: Secondary | ICD-10-CM

## 2018-04-03 DIAGNOSIS — D6481 Anemia due to antineoplastic chemotherapy: Secondary | ICD-10-CM | POA: Diagnosis not present

## 2018-04-03 DIAGNOSIS — Z5111 Encounter for antineoplastic chemotherapy: Secondary | ICD-10-CM | POA: Diagnosis not present

## 2018-04-03 LAB — CMP (CANCER CENTER ONLY)
ALT: 14 U/L (ref 0–44)
AST: 21 U/L (ref 15–41)
Albumin: 3.1 g/dL — ABNORMAL LOW (ref 3.5–5.0)
Alkaline Phosphatase: 159 U/L — ABNORMAL HIGH (ref 38–126)
Anion gap: 11 (ref 5–15)
BUN: 8 mg/dL (ref 8–23)
CALCIUM: 8.6 mg/dL — AB (ref 8.9–10.3)
CO2: 36 mmol/L — ABNORMAL HIGH (ref 22–32)
Chloride: 97 mmol/L — ABNORMAL LOW (ref 98–111)
Creatinine: 0.95 mg/dL (ref 0.61–1.24)
GFR, Est AFR Am: >60 mL/min (ref >60)
GFR, Estimated: >60 mL/min (ref >60)
Glucose, Bld: 128 mg/dL — ABNORMAL HIGH (ref 70–99)
Potassium: 2.8 mmol/L — CL (ref 3.5–5.1)
Sodium: 144 mmol/L (ref 135–145)
Total Bilirubin: 1.4 mg/dL — ABNORMAL HIGH (ref 0.3–1.2)
Total Protein: 5.8 g/dL — ABNORMAL LOW (ref 6.5–8.1)

## 2018-04-03 LAB — CBC WITH DIFFERENTIAL (CANCER CENTER ONLY)
Abs Immature Granulocytes: 0.11 10*3/uL — ABNORMAL HIGH (ref 0.00–0.07)
BASOS ABS: 0.1 10*3/uL (ref 0.0–0.1)
Basophils Relative: 1 %
Eosinophils Absolute: 0 10*3/uL (ref 0.0–0.5)
Eosinophils Relative: 0 %
HCT: 28 % — ABNORMAL LOW (ref 39.0–52.0)
Hemoglobin: 9 g/dL — ABNORMAL LOW (ref 13.0–17.0)
Immature Granulocytes: 2 %
Lymphocytes Relative: 17 %
Lymphs Abs: 1.2 10*3/uL (ref 0.7–4.0)
MCH: 30 pg (ref 26.0–34.0)
MCHC: 32.1 g/dL (ref 30.0–36.0)
MCV: 93.3 fL (ref 80.0–100.0)
Monocytes Absolute: 0.9 10*3/uL (ref 0.1–1.0)
Monocytes Relative: 13 %
Neutro Abs: 5 10*3/uL (ref 1.7–7.7)
Neutrophils Relative %: 67 %
PLATELETS: 199 10*3/uL (ref 150–400)
RBC: 3 MIL/uL — AB (ref 4.22–5.81)
RDW: 17.2 % — ABNORMAL HIGH (ref 11.5–15.5)
WBC: 7.3 10*3/uL (ref 4.0–10.5)
nRBC: 1 % — ABNORMAL HIGH (ref 0.0–0.2)

## 2018-04-03 LAB — SAMPLE TO BLOOD BANK

## 2018-04-03 MED ORDER — POTASSIUM CHLORIDE CRYS ER 20 MEQ PO TBCR
EXTENDED_RELEASE_TABLET | ORAL | Status: AC
  Filled 2018-04-03: qty 2

## 2018-04-03 MED ORDER — SODIUM CHLORIDE 0.9% FLUSH
10.0000 mL | INTRAVENOUS | Status: DC | PRN
  Filled 2018-04-03: qty 10

## 2018-04-03 MED ORDER — PROCHLORPERAZINE MALEATE 5 MG PO TABS: 5.0000 mg | ORAL_TABLET | Freq: Four times a day (QID) | ORAL | 0 refills | Status: DC | PRN

## 2018-04-03 MED ORDER — SODIUM CHLORIDE 0.9 % IV SOLN: 10.0000 mg | Freq: Once | INTRAVENOUS | Status: DC

## 2018-04-03 MED ORDER — HEPARIN SOD (PORK) LOCK FLUSH 100 UNIT/ML IV SOLN
250.0000 [IU] | Freq: Once | INTRAVENOUS | Status: AC | PRN
  Administered 2018-04-03: 250 [IU]
  Filled 2018-04-03: qty 5

## 2018-04-03 MED ORDER — SODIUM CHLORIDE 0.9 % IV SOLN
Freq: Once | INTRAVENOUS | Status: AC
  Administered 2018-04-03: 12:00:00 via INTRAVENOUS
  Filled 2018-04-03: qty 250

## 2018-04-03 MED ORDER — PALONOSETRON HCL INJECTION 0.25 MG/5ML
0.2500 mg | Freq: Once | INTRAVENOUS | Status: AC
  Administered 2018-04-03: 0.25 mg via INTRAVENOUS

## 2018-04-03 MED ORDER — SODIUM CHLORIDE 0.9 % IV SOLN
405.6000 mg | Freq: Once | INTRAVENOUS | Status: AC
  Administered 2018-04-03: 410 mg via INTRAVENOUS
  Filled 2018-04-03: qty 41

## 2018-04-03 MED ORDER — SODIUM CHLORIDE 0.9% FLUSH
10.0000 mL | INTRAVENOUS | Status: DC | PRN
  Administered 2018-04-03: 10 mL via INTRAVENOUS
  Filled 2018-04-03: qty 10

## 2018-04-03 MED ORDER — DEXAMETHASONE SODIUM PHOSPHATE 10 MG/ML IJ SOLN
INTRAMUSCULAR | Status: AC
  Filled 2018-04-03: qty 1

## 2018-04-03 MED ORDER — POTASSIUM CHLORIDE CRYS ER 20 MEQ PO TBCR
40.0000 meq | EXTENDED_RELEASE_TABLET | Freq: Once | ORAL | Status: AC
  Administered 2018-04-03: 40 meq via ORAL

## 2018-04-03 MED ORDER — SODIUM CHLORIDE 0.9 % IV SOLN
80.0000 mg/m2 | Freq: Once | INTRAVENOUS | Status: AC
  Administered 2018-04-03: 170 mg via INTRAVENOUS
  Filled 2018-04-03: qty 8.5

## 2018-04-03 MED ORDER — DEXAMETHASONE SODIUM PHOSPHATE 10 MG/ML IJ SOLN
10.0000 mg | Freq: Once | INTRAMUSCULAR | Status: AC
  Administered 2018-04-03: 10 mg via INTRAVENOUS

## 2018-04-03 MED ORDER — PALONOSETRON HCL INJECTION 0.25 MG/5ML
INTRAVENOUS | Status: AC
  Filled 2018-04-03: qty 5

## 2018-04-03 MED ORDER — POTASSIUM CHLORIDE CRYS ER 20 MEQ PO TBCR: 20.0000 meq | EXTENDED_RELEASE_TABLET | Freq: Every day | ORAL | 1 refills | Status: DC

## 2018-04-03 MED ORDER — SODIUM CHLORIDE 0.9% FLUSH
10.0000 mL | INTRAVENOUS | Status: DC | PRN
  Administered 2018-04-03: 10 mL
  Filled 2018-04-03: qty 10

## 2018-04-03 NOTE — Telephone Encounter (Signed)
Rhonda M.T. call report.  Today's K+ = 2.8.  Message left for collaborative with results.  Sceduled provider F/U today.

## 2018-04-03 NOTE — Progress Notes (Signed)
Provided printed materials on carboplatin and etoposide and explained to patient and wife how they are given and the timing of cycles.

## 2018-04-03 NOTE — Telephone Encounter (Signed)
Scheduled appt per 2/25 los.  Printed calendar and avs.

## 2018-04-03 NOTE — Progress Notes (Signed)
[facility] OFFICE PROGRESS NOTE   Diagnosis: Extensive stage small cell lung cancer  INTERVAL HISTORY:   [name] returns for a scheduled visit.  I saw him while hospitalized last month when he was diagnosed with extensive stage small cell lung cancer involving the bone marrow.  He presented with severe thrombocytopenia He was discharged to a skilled nursing facility 03/21/2018.  He has returned home.  He reports improvement in dyspnea.  Multiple ecchymoses have resolved.  He reports feeling much better compared to hospital admission last month. Objective:  Vital signs in last 24 hours:  Blood pressure 121/80, pulse 77, temperature 97.7 F (36.5 C), temperature source Oral, resp. rate 19, height 5' 9"  (1.753 m), weight 209 lb 12.8 oz (95.2 kg), SpO2 100 %.    HEENT: No thrush or ulcers Resp: Lungs clear bilaterally Cardio: Regular rate and rhythm GI: No hepatomegaly, nontender Vascular: Pitting edema at the low leg bilaterally    Portacath/PICC-without erythema  Lab Results:  Lab Results  Component Value Date   WBC 7.3 04/03/2018   HGB 9.0 (L) 04/03/2018   HCT 28.0 (L) 04/03/2018   MCV 93.3 04/03/2018   PLT 199 04/03/2018   NEUTROABS 5.0 04/03/2018    CMP  Lab Results  Component Value Date   NA 144 04/03/2018   K 2.8 (LL) 04/03/2018   CL 97 (L) 04/03/2018   CO2 36 (H) 04/03/2018   GLUCOSE 128 (H) 04/03/2018   BUN 8 04/03/2018   CREATININE 0.95 04/03/2018   CALCIUM 8.6 (L) 04/03/2018   PROT 5.8 (L) 04/03/2018   ALBUMIN 3.1 (L) 04/03/2018   AST 21 04/03/2018   ALT 14 04/03/2018   ALKPHOS 159 (H) 04/03/2018   BILITOT 1.4 (H) 04/03/2018   GFRNONAA >60 04/03/2018   GFRAA >60 04/03/2018    Medications: I have reviewed the patient's current medications.   Assessment/Plan: 1.  Extensive stage small cell lung cancer  CT chest 03/05/2018-left hilar mass, small mediastinal lymph nodes, atelectasis versus consolidation versus mass in the  superior segment of the left lower lobe  Bone marrow biopsy 03/07/2018-extensive involvement of the bone marrow with metastatic small cell carcinoma consistent with a lung primary, small foci of non-small cell differentiation  Cycle 1 etoposide/carboplatin 03/09/2018, G-CSF started 03/12/2018 and discontinued 03/20/2018  CT head 03/21/2018-no evidence of metastatic disease  Cycle 2 etoposide/carboplatin, dose escalation of etoposide and carboplatin 04/03/2018 2.  Thrombocytopenia secondary to #1-improved 3.COPD 4.History of coronary artery disease 5.CHF 6.BPH 7.Admission with pneumonia December 2019 8.Mild renal insufficiency-improved 9.Elevated liver enzymes 10.  Coagulopathy 11.  History of mild elevation of the calcium level 12.  Anemia/leukopenia secondary to small cell lung cancer and chemotherapy-improved 13.  Hypokalemia-likely secondary to furosemide therapy, potassium supplement started 04/03/2018    Disposition: [name] has extensive stage small cell lung cancer.  He has completed 1 cycle of etoposide and carboplatin.  He tolerated the chemotherapy well.  He had severe thrombocytopenia at the time of treatment and required transfusion support following chemotherapy.  The platelet count has normalized and the hemoglobin is improved.  [name] will complete cycle 2 etoposide and carboplatin today.  The chemotherapy will be dose escalated and he will receive G-CSF support.  The right PIC remains in place.  He will be referred for placement of a Port-A-Cath prior to the next cycle of chemotherapy.  [name] will return for a nadir CBC on 04/17/2018.  He will be scheduled for an office visit prior to cycle  3 etoposide/carboplatin 04/30/2018  [name], MD  04/03/2018  4:58 PM

## 2018-04-03 NOTE — Patient Instructions (Signed)
[facility] Discharge Instructions for Patients Receiving Chemotherapy  Today you received the following chemotherapy agents Carboplatin; etopside  To help prevent nausea and vomiting after your treatment, we encourage you to take your nausea medication as prescribed  If you develop nausea and vomiting that is not controlled by your nausea medication, call the clinic.   BELOW ARE SYMPTOMS THAT SHOULD BE REPORTED IMMEDIATELY:  *FEVER GREATER THAN 100.5 F  *CHILLS WITH OR WITHOUT FEVER  NAUSEA AND VOMITING THAT IS NOT CONTROLLED WITH YOUR NAUSEA MEDICATION  *UNUSUAL SHORTNESS OF BREATH  *UNUSUAL BRUISING OR BLEEDING  TENDERNESS IN MOUTH AND THROAT WITH OR WITHOUT PRESENCE OF ULCERS  *URINARY PROBLEMS  *BOWEL PROBLEMS  UNUSUAL RASH Items with * indicate a potential emergency and should be followed up as soon as possible.  Feel free to call the clinic should you have any questions or concerns. The clinic phone number is [phone].  Please show the [facility] at check-in to the Emergency Department and triage nurse.  Etoposide, VP-16 injection What is this medicine? ETOPOSIDE, VP-16 (e toe POE side) is a chemotherapy drug. It is used to treat testicular cancer, lung cancer, and other cancers. This medicine may be used for other purposes; ask your health care provider or pharmacist if you have questions. COMMON BRAND NAME(S): Etopophos, Toposar, VePesid What should I tell my health care provider before I take this medicine? They need to know if you have any of these conditions: -infection -kidney disease -liver disease -low blood counts, like low white cell, platelet, or red cell counts -an unusual or allergic reaction to etoposide, other medicines, foods, dyes, or preservatives -pregnant or trying to get pregnant -breast-feeding How should I use this medicine? This medicine is for infusion into a vein. It is administered in a hospital or clinic by a  specially trained health care professional. Talk to your pediatrician regarding the use of this medicine in children. Special care may be needed. Overdosage: If you think you have taken too much of this medicine contact a poison control center or emergency room at once. NOTE: This medicine is only for you. Do not share this medicine with others. What if I miss a dose? It is important not to miss your dose. Call your doctor or health care professional if you are unable to keep an appointment. What may interact with this medicine? -aspirin -certain medications for seizures like carbamazepine, phenobarbital, phenytoin, valproic acid -cyclosporine -levamisole -warfarin This list may not describe all possible interactions. Give your health care provider a list of all the medicines, herbs, non-prescription drugs, or dietary supplements you use. Also tell them if you smoke, drink alcohol, or use illegal drugs. Some items may interact with your medicine. What should I watch for while using this medicine? Visit your doctor for checks on your progress. This drug may make you feel generally unwell. This is not uncommon, as chemotherapy can affect healthy cells as well as cancer cells. Report any side effects. Continue your course of treatment even though you feel ill unless your doctor tells you to stop. In some cases, you may be given additional medicines to help with side effects. Follow all directions for their use. Call your doctor or health care professional for advice if you get a fever, chills or sore throat, or other symptoms of a cold or flu. Do not treat yourself. This drug decreases your body's ability to fight infections. Try to avoid being around people who are sick. This  medicine may increase your risk to bruise or bleed. Call your doctor or health care professional if you notice any unusual bleeding. Talk to your doctor about your risk of cancer. You may be more at risk for certain types of  cancers if you take this medicine. Do not become pregnant while taking this medicine or for at least 6 months after stopping it. Women should inform their doctor if they wish to become pregnant or think they might be pregnant. Women of child-bearing potential will need to have a negative pregnancy test before starting this medicine. There is a potential for serious side effects to an unborn child. Talk to your health care professional or pharmacist for more information. Do not breast-feed an infant while taking this medicine. Men must use a latex condom during sexual contact with a woman while taking this medicine and for at least 4 months after stopping it. A latex condom is needed even if you have had a vasectomy. Contact your doctor right away if your partner becomes pregnant. Do not donate sperm while taking this medicine and for at least 4 months after you stop taking this medicine. Men should inform their doctors if they wish to father a child. This medicine may lower sperm counts. What side effects may I notice from receiving this medicine? Side effects that you should report to your doctor or health care professional as soon as possible: -allergic reactions like skin rash, itching or hives, swelling of the face, lips, or tongue -low blood counts - this medicine may decrease the number of white blood cells, red blood cells and platelets. You may be at increased risk for infections and bleeding. -signs of infection - fever or chills, cough, sore throat, pain or difficulty passing urine -signs of decreased platelets or bleeding - bruising, pinpoint red spots on the skin, black, tarry stools, blood in the urine -signs of decreased red blood cells - unusually weak or tired, fainting spells, lightheadedness -breathing problems -changes in vision -mouth or throat sores or ulcers -pain, redness, swelling or irritation at the injection site -pain, tingling, numbness in the hands or feet -redness,  blistering, peeling or loosening of the skin, including inside the mouth -seizures -vomiting Side effects that usually do not require medical attention (report to your doctor or health care professional if they continue or are bothersome): -diarrhea -hair loss -loss of appetite -nausea -stomach pain This list may not describe all possible side effects. Call your doctor for medical advice about side effects. You may report side effects to FDA at 1-800-FDA-1088. Where should I keep my medicine? This drug is given in a hospital or clinic and will not be stored at home. NOTE: This sheet is a summary. It may not cover all possible information. If you have questions about this medicine, talk to your doctor, pharmacist, or health care provider.  2019 Elsevier/Gold Standard (2015-01-16 11:53:23)   Carboplatin injection What is this medicine? CARBOPLATIN (KAR boe pla tin) is a chemotherapy drug. It targets fast dividing cells, like cancer cells, and causes these cells to die. This medicine is used to treat ovarian cancer and many other cancers. This medicine may be used for other purposes; ask your health care provider or pharmacist if you have questions. COMMON BRAND NAME(S): Paraplatin What should I tell my health care provider before I take this medicine? They need to know if you have any of these conditions: -blood disorders -hearing problems -kidney disease -recent or ongoing radiation therapy -an unusual or allergic  reaction to carboplatin, cisplatin, other chemotherapy, other medicines, foods, dyes, or preservatives -pregnant or trying to get pregnant -breast-feeding How should I use this medicine? This drug is usually given as an infusion into a vein. It is administered in a hospital or clinic by a specially trained health care professional. Talk to your pediatrician regarding the use of this medicine in children. Special care may be needed. Overdosage: If you think you have taken  too much of this medicine contact a poison control center or emergency room at once. NOTE: This medicine is only for you. Do not share this medicine with others. What if I miss a dose? It is important not to miss a dose. Call your doctor or health care professional if you are unable to keep an appointment. What may interact with this medicine? -medicines for seizures -medicines to increase blood counts like filgrastim, pegfilgrastim, sargramostim -some antibiotics like amikacin, gentamicin, neomycin, streptomycin, tobramycin -vaccines Talk to your doctor or health care professional before taking any of these medicines: -acetaminophen -aspirin -ibuprofen -ketoprofen -naproxen This list may not describe all possible interactions. Give your health care provider a list of all the medicines, herbs, non-prescription drugs, or dietary supplements you use. Also tell them if you smoke, drink alcohol, or use illegal drugs. Some items may interact with your medicine. What should I watch for while using this medicine? Your condition will be monitored carefully while you are receiving this medicine. You will need important blood work done while you are taking this medicine. This drug may make you feel generally unwell. This is not uncommon, as chemotherapy can affect healthy cells as well as cancer cells. Report any side effects. Continue your course of treatment even though you feel ill unless your doctor tells you to stop. In some cases, you may be given additional medicines to help with side effects. Follow all directions for their use. Call your doctor or health care professional for advice if you get a fever, chills or sore throat, or other symptoms of a cold or flu. Do not treat yourself. This drug decreases your body's ability to fight infections. Try to avoid being around people who are sick. This medicine may increase your risk to bruise or bleed. Call your doctor or health care professional if you  notice any unusual bleeding. Be careful brushing and flossing your teeth or using a toothpick because you may get an infection or bleed more easily. If you have any dental work done, tell your dentist you are receiving this medicine. Avoid taking products that contain aspirin, acetaminophen, ibuprofen, naproxen, or ketoprofen unless instructed by your doctor. These medicines may hide a fever. Do not become pregnant while taking this medicine. Women should inform their doctor if they wish to become pregnant or think they might be pregnant. There is a potential for serious side effects to an unborn child. Talk to your health care professional or pharmacist for more information. Do not breast-feed an infant while taking this medicine. What side effects may I notice from receiving this medicine? Side effects that you should report to your doctor or health care professional as soon as possible: -allergic reactions like skin rash, itching or hives, swelling of the face, lips, or tongue -signs of infection - fever or chills, cough, sore throat, pain or difficulty passing urine -signs of decreased platelets or bleeding - bruising, pinpoint red spots on the skin, black, tarry stools, nosebleeds -signs of decreased red blood cells - unusually weak or tired, fainting spells, lightheadedness -  breathing problems -changes in hearing -changes in vision -chest pain -high blood pressure -low blood counts - This drug may decrease the number of white blood cells, red blood cells and platelets. You may be at increased risk for infections and bleeding. -nausea and vomiting -pain, swelling, redness or irritation at the injection site -pain, tingling, numbness in the hands or feet -problems with balance, talking, walking -trouble passing urine or change in the amount of urine Side effects that usually do not require medical attention (report to your doctor or health care professional if they continue or are  bothersome): -hair loss -loss of appetite -metallic taste in the mouth or changes in taste This list may not describe all possible side effects. Call your doctor for medical advice about side effects. You may report side effects to FDA at 1-800-FDA-1088. Where should I keep my medicine? This drug is given in a hospital or clinic and will not be stored at home. NOTE: This sheet is a summary. It may not cover all possible information. If you have questions about this medicine, talk to your doctor, pharmacist, or health care provider.  2019 Elsevier/Gold Standard (2007-05-01 14:38:05)

## 2018-04-03 NOTE — Patient Instructions (Signed)
Prescription for Compazine 5 mg every 6 hours as needed for nausea was sent to your pharmacy of record.

## 2018-04-04 ENCOUNTER — Inpatient Hospital Stay: Payer: Medicare PPO

## 2018-04-04 VITALS — BP 136/59 | HR 74 | Temp 97.7°F | Resp 18

## 2018-04-04 DIAGNOSIS — Z5111 Encounter for antineoplastic chemotherapy: Secondary | ICD-10-CM | POA: Diagnosis not present

## 2018-04-04 DIAGNOSIS — C7951 Secondary malignant neoplasm of bone: Secondary | ICD-10-CM | POA: Diagnosis not present

## 2018-04-04 DIAGNOSIS — N289 Disorder of kidney and ureter, unspecified: Secondary | ICD-10-CM | POA: Diagnosis not present

## 2018-04-04 DIAGNOSIS — J439 Emphysema, unspecified: Secondary | ICD-10-CM | POA: Diagnosis not present

## 2018-04-04 DIAGNOSIS — I2510 Atherosclerotic heart disease of native coronary artery without angina pectoris: Secondary | ICD-10-CM | POA: Diagnosis not present

## 2018-04-04 DIAGNOSIS — C801 Malignant (primary) neoplasm, unspecified: Secondary | ICD-10-CM

## 2018-04-04 DIAGNOSIS — J9691 Respiratory failure, unspecified with hypoxia: Secondary | ICD-10-CM | POA: Diagnosis not present

## 2018-04-04 DIAGNOSIS — D701 Agranulocytosis secondary to cancer chemotherapy: Secondary | ICD-10-CM | POA: Diagnosis not present

## 2018-04-04 DIAGNOSIS — I5022 Chronic systolic (congestive) heart failure: Secondary | ICD-10-CM | POA: Diagnosis not present

## 2018-04-04 DIAGNOSIS — C3432 Malignant neoplasm of lower lobe, left bronchus or lung: Secondary | ICD-10-CM | POA: Diagnosis not present

## 2018-04-04 DIAGNOSIS — M47815 Spondylosis without myelopathy or radiculopathy, thoracolumbar region: Secondary | ICD-10-CM | POA: Diagnosis not present

## 2018-04-04 DIAGNOSIS — C3490 Malignant neoplasm of unspecified part of unspecified bronchus or lung: Secondary | ICD-10-CM | POA: Diagnosis not present

## 2018-04-04 DIAGNOSIS — Z5189 Encounter for other specified aftercare: Secondary | ICD-10-CM | POA: Diagnosis not present

## 2018-04-04 DIAGNOSIS — C7952 Secondary malignant neoplasm of bone marrow: Secondary | ICD-10-CM | POA: Diagnosis not present

## 2018-04-04 DIAGNOSIS — R1311 Dysphagia, oral phase: Secondary | ICD-10-CM | POA: Diagnosis not present

## 2018-04-04 DIAGNOSIS — I110 Hypertensive heart disease with heart failure: Secondary | ICD-10-CM | POA: Diagnosis not present

## 2018-04-04 MED ORDER — DEXAMETHASONE SODIUM PHOSPHATE 10 MG/ML IJ SOLN
INTRAMUSCULAR | Status: AC
  Filled 2018-04-04: qty 1

## 2018-04-04 MED ORDER — HEPARIN SOD (PORK) LOCK FLUSH 100 UNIT/ML IV SOLN
250.0000 [IU] | Freq: Once | INTRAVENOUS | Status: AC | PRN
  Administered 2018-04-04: 250 [IU]
  Filled 2018-04-04: qty 5

## 2018-04-04 MED ORDER — DEXAMETHASONE SODIUM PHOSPHATE 10 MG/ML IJ SOLN
10.0000 mg | Freq: Once | INTRAMUSCULAR | Status: AC
  Administered 2018-04-04: 10 mg via INTRAVENOUS

## 2018-04-04 MED ORDER — SODIUM CHLORIDE 0.9 % IV SOLN
Freq: Once | INTRAVENOUS | Status: AC
  Administered 2018-04-04: 15:00:00 via INTRAVENOUS
  Filled 2018-04-04: qty 250

## 2018-04-04 MED ORDER — SODIUM CHLORIDE 0.9 % IV SOLN
80.0000 mg/m2 | Freq: Once | INTRAVENOUS | Status: AC
  Administered 2018-04-04: 170 mg via INTRAVENOUS
  Filled 2018-04-04: qty 8.5

## 2018-04-04 MED ORDER — SODIUM CHLORIDE 0.9% FLUSH
10.0000 mL | INTRAVENOUS | Status: DC | PRN
  Administered 2018-04-04: 10 mL
  Filled 2018-04-04: qty 10

## 2018-04-04 NOTE — Patient Instructions (Signed)
[facility] Discharge Instructions for Patients Receiving Chemotherapy  Today you received the following chemotherapy agents Etoposide  To help prevent nausea and vomiting after your treatment, we encourage you to take your nausea medication as directed.    If you develop nausea and vomiting that is not controlled by your nausea medication, call the clinic.   BELOW ARE SYMPTOMS THAT SHOULD BE REPORTED IMMEDIATELY:  *FEVER GREATER THAN 100.5 F  *CHILLS WITH OR WITHOUT FEVER  NAUSEA AND VOMITING THAT IS NOT CONTROLLED WITH YOUR NAUSEA MEDICATION  *UNUSUAL SHORTNESS OF BREATH  *UNUSUAL BRUISING OR BLEEDING  TENDERNESS IN MOUTH AND THROAT WITH OR WITHOUT PRESENCE OF ULCERS  *URINARY PROBLEMS  *BOWEL PROBLEMS  UNUSUAL RASH Items with * indicate a potential emergency and should be followed up as soon as possible.  Feel free to call the clinic should you have any questions or concerns. The clinic phone number is [phone].  Please show the [facility] at check-in to the Emergency Department and triage nurse.

## 2018-04-05 ENCOUNTER — Inpatient Hospital Stay: Payer: Medicare PPO

## 2018-04-05 VITALS — BP 127/59 | HR 72 | Temp 97.8°F | Resp 18

## 2018-04-05 DIAGNOSIS — Z5111 Encounter for antineoplastic chemotherapy: Secondary | ICD-10-CM | POA: Diagnosis not present

## 2018-04-05 DIAGNOSIS — C801 Malignant (primary) neoplasm, unspecified: Secondary | ICD-10-CM

## 2018-04-05 DIAGNOSIS — C3432 Malignant neoplasm of lower lobe, left bronchus or lung: Secondary | ICD-10-CM | POA: Diagnosis not present

## 2018-04-05 DIAGNOSIS — C7952 Secondary malignant neoplasm of bone marrow: Secondary | ICD-10-CM | POA: Diagnosis not present

## 2018-04-05 DIAGNOSIS — D701 Agranulocytosis secondary to cancer chemotherapy: Secondary | ICD-10-CM | POA: Diagnosis not present

## 2018-04-05 DIAGNOSIS — Z5189 Encounter for other specified aftercare: Secondary | ICD-10-CM | POA: Diagnosis not present

## 2018-04-05 DIAGNOSIS — N289 Disorder of kidney and ureter, unspecified: Secondary | ICD-10-CM | POA: Diagnosis not present

## 2018-04-05 LAB — BASIC METABOLIC PANEL - CANCER CENTER ONLY
Anion gap: 10 (ref 5–15)
BUN: 17 mg/dL (ref 8–23)
CO2: 31 mmol/L (ref 22–32)
Calcium: 9 mg/dL (ref 8.9–10.3)
Chloride: 101 mmol/L (ref 98–111)
Creatinine: 1 mg/dL (ref 0.61–1.24)
GFR, Estimated: >60 mL/min (ref >60)
Glucose, Bld: 97 mg/dL (ref 70–99)
Potassium: 3.6 mmol/L (ref 3.5–5.1)
Sodium: 142 mmol/L (ref 135–145)

## 2018-04-05 MED ORDER — DEXAMETHASONE SODIUM PHOSPHATE 10 MG/ML IJ SOLN
INTRAMUSCULAR | Status: AC
  Filled 2018-04-05: qty 1

## 2018-04-05 MED ORDER — SODIUM CHLORIDE 0.9 % IV SOLN
80.0000 mg/m2 | Freq: Once | INTRAVENOUS | Status: AC
  Administered 2018-04-05: 170 mg via INTRAVENOUS
  Filled 2018-04-05: qty 8.5

## 2018-04-05 MED ORDER — SODIUM CHLORIDE 0.9% FLUSH
10.0000 mL | INTRAVENOUS | Status: DC | PRN
  Administered 2018-04-05: 10 mL
  Filled 2018-04-05: qty 10

## 2018-04-05 MED ORDER — DEXAMETHASONE SODIUM PHOSPHATE 10 MG/ML IJ SOLN
10.0000 mg | Freq: Once | INTRAMUSCULAR | Status: AC
  Administered 2018-04-05: 10 mg via INTRAVENOUS

## 2018-04-05 MED ORDER — SODIUM CHLORIDE 0.9 % IV SOLN
Freq: Once | INTRAVENOUS | Status: AC
  Administered 2018-04-05: 14:00:00 via INTRAVENOUS
  Filled 2018-04-05: qty 250

## 2018-04-05 MED ORDER — HEPARIN SOD (PORK) LOCK FLUSH 100 UNIT/ML IV SOLN
250.0000 [IU] | Freq: Once | INTRAVENOUS | Status: AC | PRN
  Administered 2018-04-05: 250 [IU]
  Filled 2018-04-05: qty 5

## 2018-04-05 NOTE — Patient Instructions (Signed)
[facility] Discharge Instructions for Patients Receiving Chemotherapy  Today you received the following chemotherapy agents Etoposide  To help prevent nausea and vomiting after your treatment, we encourage you to take your nausea medication as directed.    If you develop nausea and vomiting that is not controlled by your nausea medication, call the clinic.   BELOW ARE SYMPTOMS THAT SHOULD BE REPORTED IMMEDIATELY:  *FEVER GREATER THAN 100.5 F  *CHILLS WITH OR WITHOUT FEVER  NAUSEA AND VOMITING THAT IS NOT CONTROLLED WITH YOUR NAUSEA MEDICATION  *UNUSUAL SHORTNESS OF BREATH  *UNUSUAL BRUISING OR BLEEDING  TENDERNESS IN MOUTH AND THROAT WITH OR WITHOUT PRESENCE OF ULCERS  *URINARY PROBLEMS  *BOWEL PROBLEMS  UNUSUAL RASH Items with * indicate a potential emergency and should be followed up as soon as possible.  Feel free to call the clinic should you have any questions or concerns. The clinic phone number is [phone].  Please show the [facility] at check-in to the Emergency Department and triage nurse.

## 2018-04-06 DIAGNOSIS — C7951 Secondary malignant neoplasm of bone: Secondary | ICD-10-CM | POA: Diagnosis not present

## 2018-04-06 DIAGNOSIS — M47815 Spondylosis without myelopathy or radiculopathy, thoracolumbar region: Secondary | ICD-10-CM | POA: Diagnosis not present

## 2018-04-06 DIAGNOSIS — I110 Hypertensive heart disease with heart failure: Secondary | ICD-10-CM | POA: Diagnosis not present

## 2018-04-06 DIAGNOSIS — C3490 Malignant neoplasm of unspecified part of unspecified bronchus or lung: Secondary | ICD-10-CM | POA: Diagnosis not present

## 2018-04-06 DIAGNOSIS — R1311 Dysphagia, oral phase: Secondary | ICD-10-CM | POA: Diagnosis not present

## 2018-04-06 DIAGNOSIS — I5022 Chronic systolic (congestive) heart failure: Secondary | ICD-10-CM | POA: Diagnosis not present

## 2018-04-06 DIAGNOSIS — J9691 Respiratory failure, unspecified with hypoxia: Secondary | ICD-10-CM | POA: Diagnosis not present

## 2018-04-06 DIAGNOSIS — J439 Emphysema, unspecified: Secondary | ICD-10-CM | POA: Diagnosis not present

## 2018-04-06 DIAGNOSIS — I2510 Atherosclerotic heart disease of native coronary artery without angina pectoris: Secondary | ICD-10-CM | POA: Diagnosis not present

## 2018-04-07 ENCOUNTER — Inpatient Hospital Stay: Payer: Medicare PPO

## 2018-04-07 VITALS — BP 117/62 | HR 89 | Temp 97.6°F | Resp 22

## 2018-04-07 DIAGNOSIS — C801 Malignant (primary) neoplasm, unspecified: Secondary | ICD-10-CM

## 2018-04-07 DIAGNOSIS — C3432 Malignant neoplasm of lower lobe, left bronchus or lung: Secondary | ICD-10-CM | POA: Diagnosis not present

## 2018-04-07 DIAGNOSIS — Z5111 Encounter for antineoplastic chemotherapy: Secondary | ICD-10-CM | POA: Diagnosis not present

## 2018-04-07 DIAGNOSIS — N289 Disorder of kidney and ureter, unspecified: Secondary | ICD-10-CM | POA: Diagnosis not present

## 2018-04-07 DIAGNOSIS — Z5189 Encounter for other specified aftercare: Secondary | ICD-10-CM | POA: Diagnosis not present

## 2018-04-07 DIAGNOSIS — D701 Agranulocytosis secondary to cancer chemotherapy: Secondary | ICD-10-CM | POA: Diagnosis not present

## 2018-04-07 DIAGNOSIS — C7952 Secondary malignant neoplasm of bone marrow: Secondary | ICD-10-CM | POA: Diagnosis not present

## 2018-04-07 MED ORDER — PEGFILGRASTIM-CBQV 6 MG/0.6ML ~~LOC~~ SOSY
PREFILLED_SYRINGE | SUBCUTANEOUS | Status: AC
  Filled 2018-04-07: qty 0.6

## 2018-04-07 MED ORDER — PEGFILGRASTIM-CBQV 6 MG/0.6ML ~~LOC~~ SOSY
6.0000 mg | PREFILLED_SYRINGE | Freq: Once | SUBCUTANEOUS | Status: AC
  Administered 2018-04-07: 6 mg via SUBCUTANEOUS

## 2018-04-07 NOTE — Patient Instructions (Signed)
Pegfilgrastim injection  What is this medicine?  PEGFILGRASTIM (PEG fil gra stim) is a long-acting granulocyte colony-stimulating factor that stimulates the growth of neutrophils, a type of white blood cell important in the body's fight against infection. It is used to reduce the incidence of fever and infection in patients with certain types of cancer who are receiving chemotherapy that affects the bone marrow, and to increase survival after being exposed to high doses of radiation.  This medicine may be used for other purposes; ask your health care provider or pharmacist if you have questions.  COMMON BRAND NAME(S): Fulphila, Neulasta, UDENYCA  What should I tell my health care provider before I take this medicine?  They need to know if you have any of these conditions:  -kidney disease  -latex allergy  -ongoing radiation therapy  -sickle cell disease  -skin reactions to acrylic adhesives (On-Body Injector only)  -an unusual or allergic reaction to pegfilgrastim, filgrastim, other medicines, foods, dyes, or preservatives  -pregnant or trying to get pregnant  -breast-feeding  How should I use this medicine?  This medicine is for injection under the skin. If you get this medicine at home, you will be taught how to prepare and give the pre-filled syringe or how to use the On-body Injector. Refer to the patient Instructions for Use for detailed instructions. Use exactly as directed. Tell your healthcare provider immediately if you suspect that the On-body Injector may not have performed as intended or if you suspect the use of the On-body Injector resulted in a missed or partial dose.  It is important that you put your used needles and syringes in a special sharps container. Do not put them in a trash can. If you do not have a sharps container, call your pharmacist or healthcare provider to get one.  Talk to your pediatrician regarding the use of this medicine in children. While this drug may be prescribed for  selected conditions, precautions do apply.  Overdosage: If you think you have taken too much of this medicine contact a poison control center or emergency room at once.  NOTE: This medicine is only for you. Do not share this medicine with others.  What if I miss a dose?  It is important not to miss your dose. Call your doctor or health care professional if you miss your dose. If you miss a dose due to an On-body Injector failure or leakage, a new dose should be administered as soon as possible using a single prefilled syringe for manual use.  What may interact with this medicine?  Interactions have not been studied.  Give your health care provider a list of all the medicines, herbs, non-prescription drugs, or dietary supplements you use. Also tell them if you smoke, drink alcohol, or use illegal drugs. Some items may interact with your medicine.  This list may not describe all possible interactions. Give your health care provider a list of all the medicines, herbs, non-prescription drugs, or dietary supplements you use. Also tell them if you smoke, drink alcohol, or use illegal drugs. Some items may interact with your medicine.  What should I watch for while using this medicine?  You may need blood work done while you are taking this medicine.  If you are going to need a MRI, CT scan, or other procedure, tell your doctor that you are using this medicine (On-Body Injector only).  What side effects may I notice from receiving this medicine?  Side effects that you should report to   your doctor or health care professional as soon as possible:  -allergic reactions like skin rash, itching or hives, swelling of the face, lips, or tongue  -back pain  -dizziness  -fever  -pain, redness, or irritation at site where injected  -pinpoint red spots on the skin  -red or dark-brown urine  -shortness of breath or breathing problems  -stomach or side pain, or pain at the shoulder  -swelling  -tiredness  -trouble passing urine or  change in the amount of urine  Side effects that usually do not require medical attention (report to your doctor or health care professional if they continue or are bothersome):  -bone pain  -muscle pain  This list may not describe all possible side effects. Call your doctor for medical advice about side effects. You may report side effects to FDA at 1-800-FDA-1088.  Where should I keep my medicine?  Keep out of the reach of children.  If you are using this medicine at home, you will be instructed on how to store it. Throw away any unused medicine after the expiration date on the label.  NOTE: This sheet is a summary. It may not cover all possible information. If you have questions about this medicine, talk to your doctor, pharmacist, or health care provider.   2019 Elsevier/Gold Standard (2017-05-01 16:57:08)

## 2018-04-09 ENCOUNTER — Inpatient Hospital Stay: Payer: Medicare PPO | Attending: Oncology

## 2018-04-09 DIAGNOSIS — C3432 Malignant neoplasm of lower lobe, left bronchus or lung: Secondary | ICD-10-CM | POA: Diagnosis not present

## 2018-04-09 DIAGNOSIS — D6959 Other secondary thrombocytopenia: Secondary | ICD-10-CM | POA: Insufficient documentation

## 2018-04-09 DIAGNOSIS — R509 Fever, unspecified: Secondary | ICD-10-CM | POA: Diagnosis not present

## 2018-04-09 DIAGNOSIS — Z5111 Encounter for antineoplastic chemotherapy: Secondary | ICD-10-CM | POA: Insufficient documentation

## 2018-04-09 DIAGNOSIS — C7952 Secondary malignant neoplasm of bone marrow: Secondary | ICD-10-CM | POA: Insufficient documentation

## 2018-04-09 DIAGNOSIS — R197 Diarrhea, unspecified: Secondary | ICD-10-CM | POA: Diagnosis not present

## 2018-04-09 DIAGNOSIS — D6481 Anemia due to antineoplastic chemotherapy: Secondary | ICD-10-CM | POA: Diagnosis not present

## 2018-04-09 DIAGNOSIS — Z452 Encounter for adjustment and management of vascular access device: Secondary | ICD-10-CM | POA: Insufficient documentation

## 2018-04-09 DIAGNOSIS — D6189 Other specified aplastic anemias and other bone marrow failure syndromes: Secondary | ICD-10-CM | POA: Diagnosis not present

## 2018-04-09 DIAGNOSIS — Z95828 Presence of other vascular implants and grafts: Secondary | ICD-10-CM

## 2018-04-09 MED ORDER — SODIUM CHLORIDE 0.9% FLUSH
10.0000 mL | INTRAVENOUS | Status: DC | PRN
  Administered 2018-04-09: 10 mL via INTRAVENOUS
  Filled 2018-04-09: qty 10

## 2018-04-09 MED ORDER — HEPARIN SOD (PORK) LOCK FLUSH 100 UNIT/ML IV SOLN
500.0000 [IU] | Freq: Once | INTRAVENOUS | Status: AC
  Administered 2018-04-09: 250 [IU] via INTRAVENOUS
  Filled 2018-04-09: qty 5

## 2018-04-10 DIAGNOSIS — C3490 Malignant neoplasm of unspecified part of unspecified bronchus or lung: Secondary | ICD-10-CM | POA: Diagnosis not present

## 2018-04-10 DIAGNOSIS — J9691 Respiratory failure, unspecified with hypoxia: Secondary | ICD-10-CM | POA: Diagnosis not present

## 2018-04-10 DIAGNOSIS — J439 Emphysema, unspecified: Secondary | ICD-10-CM | POA: Diagnosis not present

## 2018-04-10 DIAGNOSIS — I110 Hypertensive heart disease with heart failure: Secondary | ICD-10-CM | POA: Diagnosis not present

## 2018-04-10 DIAGNOSIS — I5022 Chronic systolic (congestive) heart failure: Secondary | ICD-10-CM | POA: Diagnosis not present

## 2018-04-10 DIAGNOSIS — C7951 Secondary malignant neoplasm of bone: Secondary | ICD-10-CM | POA: Diagnosis not present

## 2018-04-10 DIAGNOSIS — R1311 Dysphagia, oral phase: Secondary | ICD-10-CM | POA: Diagnosis not present

## 2018-04-10 DIAGNOSIS — M47815 Spondylosis without myelopathy or radiculopathy, thoracolumbar region: Secondary | ICD-10-CM | POA: Diagnosis not present

## 2018-04-10 DIAGNOSIS — I2510 Atherosclerotic heart disease of native coronary artery without angina pectoris: Secondary | ICD-10-CM | POA: Diagnosis not present

## 2018-04-11 ENCOUNTER — Inpatient Hospital Stay: Payer: Medicare PPO

## 2018-04-11 ENCOUNTER — Other Ambulatory Visit: Payer: Self-pay | Admitting: Sports Medicine

## 2018-04-11 DIAGNOSIS — D6959 Other secondary thrombocytopenia: Secondary | ICD-10-CM | POA: Diagnosis not present

## 2018-04-11 DIAGNOSIS — M79662 Pain in left lower leg: Principal | ICD-10-CM

## 2018-04-11 DIAGNOSIS — R197 Diarrhea, unspecified: Secondary | ICD-10-CM | POA: Diagnosis not present

## 2018-04-11 DIAGNOSIS — D6481 Anemia due to antineoplastic chemotherapy: Secondary | ICD-10-CM | POA: Diagnosis not present

## 2018-04-11 DIAGNOSIS — D6189 Other specified aplastic anemias and other bone marrow failure syndromes: Secondary | ICD-10-CM | POA: Diagnosis not present

## 2018-04-11 DIAGNOSIS — Z5111 Encounter for antineoplastic chemotherapy: Secondary | ICD-10-CM | POA: Diagnosis not present

## 2018-04-11 DIAGNOSIS — C7952 Secondary malignant neoplasm of bone marrow: Secondary | ICD-10-CM | POA: Diagnosis not present

## 2018-04-11 DIAGNOSIS — M79661 Pain in right lower leg: Secondary | ICD-10-CM

## 2018-04-11 DIAGNOSIS — Z452 Encounter for adjustment and management of vascular access device: Secondary | ICD-10-CM | POA: Diagnosis not present

## 2018-04-11 DIAGNOSIS — C3432 Malignant neoplasm of lower lobe, left bronchus or lung: Secondary | ICD-10-CM | POA: Diagnosis not present

## 2018-04-11 DIAGNOSIS — C9002 Multiple myeloma in relapse: Secondary | ICD-10-CM

## 2018-04-11 MED ORDER — SODIUM CHLORIDE 0.9% FLUSH
10.0000 mL | Freq: Once | INTRAVENOUS | Status: AC
  Administered 2018-04-11: 10 mL via INTRAVENOUS
  Filled 2018-04-11: qty 10

## 2018-04-11 MED ORDER — HEPARIN SOD (PORK) LOCK FLUSH 100 UNIT/ML IV SOLN
500.0000 [IU] | Freq: Once | INTRAVENOUS | Status: AC
  Administered 2018-04-11: 250 [IU] via INTRAVENOUS
  Filled 2018-04-11: qty 5

## 2018-04-12 DIAGNOSIS — I5022 Chronic systolic (congestive) heart failure: Secondary | ICD-10-CM | POA: Diagnosis not present

## 2018-04-12 DIAGNOSIS — C7951 Secondary malignant neoplasm of bone: Secondary | ICD-10-CM | POA: Diagnosis not present

## 2018-04-12 DIAGNOSIS — I110 Hypertensive heart disease with heart failure: Secondary | ICD-10-CM | POA: Diagnosis not present

## 2018-04-12 DIAGNOSIS — J9691 Respiratory failure, unspecified with hypoxia: Secondary | ICD-10-CM | POA: Diagnosis not present

## 2018-04-12 DIAGNOSIS — M47815 Spondylosis without myelopathy or radiculopathy, thoracolumbar region: Secondary | ICD-10-CM | POA: Diagnosis not present

## 2018-04-12 DIAGNOSIS — R1311 Dysphagia, oral phase: Secondary | ICD-10-CM | POA: Diagnosis not present

## 2018-04-12 DIAGNOSIS — C3490 Malignant neoplasm of unspecified part of unspecified bronchus or lung: Secondary | ICD-10-CM | POA: Diagnosis not present

## 2018-04-12 DIAGNOSIS — I2510 Atherosclerotic heart disease of native coronary artery without angina pectoris: Secondary | ICD-10-CM | POA: Diagnosis not present

## 2018-04-12 DIAGNOSIS — J439 Emphysema, unspecified: Secondary | ICD-10-CM | POA: Diagnosis not present

## 2018-04-13 ENCOUNTER — Encounter: Payer: Self-pay | Admitting: Family Medicine

## 2018-04-13 ENCOUNTER — Ambulatory Visit (INDEPENDENT_AMBULATORY_CARE_PROVIDER_SITE_OTHER): Payer: Medicare PPO | Admitting: Family Medicine

## 2018-04-13 VITALS — BP 136/54 | HR 93 | Ht 69.0 in | Wt 208.0 lb

## 2018-04-13 DIAGNOSIS — I5043 Acute on chronic combined systolic (congestive) and diastolic (congestive) heart failure: Secondary | ICD-10-CM

## 2018-04-13 DIAGNOSIS — J189 Pneumonia, unspecified organism: Secondary | ICD-10-CM | POA: Diagnosis not present

## 2018-04-13 DIAGNOSIS — R0902 Hypoxemia: Secondary | ICD-10-CM

## 2018-04-13 DIAGNOSIS — R1013 Epigastric pain: Secondary | ICD-10-CM

## 2018-04-13 DIAGNOSIS — J439 Emphysema, unspecified: Secondary | ICD-10-CM

## 2018-04-13 DIAGNOSIS — J9691 Respiratory failure, unspecified with hypoxia: Secondary | ICD-10-CM | POA: Diagnosis not present

## 2018-04-13 DIAGNOSIS — C7951 Secondary malignant neoplasm of bone: Secondary | ICD-10-CM | POA: Diagnosis not present

## 2018-04-13 DIAGNOSIS — R600 Localized edema: Secondary | ICD-10-CM | POA: Diagnosis not present

## 2018-04-13 DIAGNOSIS — J984 Other disorders of lung: Secondary | ICD-10-CM | POA: Diagnosis not present

## 2018-04-13 DIAGNOSIS — I110 Hypertensive heart disease with heart failure: Secondary | ICD-10-CM | POA: Diagnosis not present

## 2018-04-13 DIAGNOSIS — I2510 Atherosclerotic heart disease of native coronary artery without angina pectoris: Secondary | ICD-10-CM | POA: Diagnosis not present

## 2018-04-13 DIAGNOSIS — C3490 Malignant neoplasm of unspecified part of unspecified bronchus or lung: Secondary | ICD-10-CM | POA: Diagnosis not present

## 2018-04-13 DIAGNOSIS — I5022 Chronic systolic (congestive) heart failure: Secondary | ICD-10-CM | POA: Diagnosis not present

## 2018-04-13 DIAGNOSIS — M47815 Spondylosis without myelopathy or radiculopathy, thoracolumbar region: Secondary | ICD-10-CM | POA: Diagnosis not present

## 2018-04-13 DIAGNOSIS — R1311 Dysphagia, oral phase: Secondary | ICD-10-CM | POA: Diagnosis not present

## 2018-04-13 MED ORDER — AMBULATORY NON FORMULARY MEDICATION: 0 refills | Status: DC

## 2018-04-13 NOTE — Patient Instructions (Signed)
Make sure to use your inspirometer during commericals.

## 2018-04-13 NOTE — Progress Notes (Signed)
Established Patient Office Visit  Subjective:  Patient ID: Joseph Berg, male    DOB: 01-May-1933  Age: 85 y.o. MRN: [id number]  CC:  Chief Complaint  Patient presents with  . Coronary Artery Disease  . Abdominal Pain    he c/o sharp pain above his belly button that comes and goes he reports that it starts when he wakes up in the morning around 9 and goes away after lunch. BM's have been normal    HPI Joseph Berg presents for hospital follow-up.  He was admitted on January 27 for severe thrombocytopenia along with increased respiratory distress.  He had undergone a bone marrow biopsy which showed concerns for small cell carcinoma likely lung with pulmonary involvement.  Patient was diuresed and received multiple units of packed red blood cells as well as a platelet transfusion.  He was also treated for left lobar pneumonia with 7 days of Rocephin.  He was discharged to a skilled nursing facility on February 12.  Since then he has been gradually getting better.  He went home about 2 weeks ago and has been going for infusions.  His wife is here with him today.  He says he is not back to his usual self but is getting stronger.  He is now getting home physical therapy and nursing.  He does not feel like he needs occupational therapy anymore.  His wife reports that his feet have been a little bit more swollen.  She did bump up his diuretic to 2 tabs for 2 days in a row and says that the swelling did improve.  He also reports for the last week he has been getting some pain around the bellybutton.  He describes it as a tightness sensation he noticed that usually in the mornings and then usually by about once it goes away.  Though sometimes it goes in midway in between.  He says he is having normal bowel movements without any constipation.  No nausea or vomiting.  No blood in the urine or stool.  Past Medical History:  Diagnosis Date  . BPH (benign prostatic hyperplasia)   . CAD  (coronary artery disease)    a. s/p CABG x1 with LIMA-LAD 1994. b. LM & 3v CAD by cath 07/2012  . CHF (congestive heart failure) ([place])    a. EF 35-40% by echo 07/2012.  [name] Complication of anesthesia    affected patients memory. Pt stated "I couldn't remember anything for three months...just bits and pieces"  . Emphysema    a. Moderate emphysema by CT 06/2012.  [name] Heart attack ([place]) 1994  . Hyperlipidemia   . Hypertension   . Hypoparathyroidism ([place])   . NSVT (nonsustained ventricular tachycardia) ([place])    a. Seen on tele 07/2012.  . OSA on CPAP    pt stated he does not wear CPAP because he no longer has sleep apnea  . Pneumonia   . Pseudoaneurysm of left femoral artery ([place])    a. post cath s/p compression 07/2012, associated w/ anemia.  . Sciatica 2012   Qualifier: Diagnosis of  By: Madilyn Fireman MD, Barnetta Chapel      Past Surgical History:  Procedure Laterality Date  . CATARACT EXTRACTION, BILATERAL    . COLONOSCOPY    . CORONARY ARTERY BYPASS GRAFT  03-23-92  . CORONARY ARTERY BYPASS GRAFT N/A 08/14/2012   Procedure: REDO CORONARY ARTERY BYPASS GRAFTING (CABG);  Surgeon: Gaye Pollack, MD;  Location: [facility];  Service: Open Heart Surgery;  Laterality: N/A;  . INTRAOPERATIVE TRANSESOPHAGEAL ECHOCARDIOGRAM N/A 08/14/2012   Procedure: INTRAOPERATIVE TRANSESOPHAGEAL ECHOCARDIOGRAM;  Surgeon: Gaye Pollack, MD;  Location: [facility];  Service: Open Heart Surgery;  Laterality: N/A;  . LUMBAR LAMINECTOMY/DECOMPRESSION MICRODISCECTOMY Left 02/18/2015   Procedure: LUMBAR LAMINECTOMY/DECOMPRESSION MICRODISCECTOMY- LEFT FOUR-FIVE ;  Surgeon: Ashok Pall, MD;  Location: [facility];  Service: Neurosurgery;  Laterality: Left;    Family History  Problem Relation Age of Onset  . Heart attack Father 60  . Stroke Brother 67  . Hypertension Brother   . Hyperlipidemia Brother     Social History   Socioeconomic History  . Marital status: Married    Spouse name: Not on file  . Number of children: Not on file   . Years of education: Not on file  . Highest education level: Not on file  Occupational History  . Not on file  Social Needs  . Financial resource strain: Not on file  . Food insecurity:    Worry: Not on file    Inability: Not on file  . Transportation needs:    Medical: Not on file    Non-medical: Not on file  Tobacco Use  . Smoking status: Current Some Day Smoker    Packs/day: 1.00    Years: 60.00    Pack years: 60.00    Types: Cigarettes, Pipe  . Smokeless tobacco: Never Used  . Tobacco comment: 01/11/2018 "smokes cigars only now"  Substance and Sexual Activity  . Alcohol use: Yes    Comment: Occasional  . Drug use: No  . Sexual activity: Not on file    Comment: works part-time, [employer] completed H, married, no children, regular exercise.S  Lifestyle  . Physical activity:    Days per week: Not on file    Minutes per session: Not on file  . Stress: Not on file  Relationships  . Social connections:    Talks on phone: Not on file    Gets together: Not on file    Attends religious service: Not on file    Active member of club or organization: Not on file    Attends meetings of clubs or organizations: Not on file    Relationship status: Not on file  . Intimate partner violence:    Fear of current or ex partner: Not on file    Emotionally abused: Not on file    Physically abused: Not on file    Forced sexual activity: Not on file  Other Topics Concern  . Not on file  Social History Narrative  . Not on file    Outpatient Medications Prior to Visit  Medication Sig Dispense Refill  . AMBULATORY NON FORMULARY MEDICATION Nebulizer - use as needed - with supplies.  Diagnosis of COPD J44.1 1 each 0  . Ascorbic Acid (VITAMIN C) 1000 MG tablet Take 1,000 mg by mouth daily.    . calcium-vitamin D (OSCAL 500/200 D-3) 500-200 MG-UNIT tablet Take 1 tablet by mouth 2 (two) times daily. 180 tablet 3  . CARBOPLATIN IV Inject into the vein.    . carvedilol (COREG)  12.5 MG tablet Take 1 tablet (12.5 mg total) by mouth 2 (two) times daily with a meal. 180 tablet 3  . cilostazol (PLETAL) 100 MG tablet TAKE 1 TABLET BY MOUTH TWICE A DAY 180 tablet 1  . Cyanocobalamin (VITAMIN B-12 PO) Take 1 tablet by mouth daily.    . ETOPOSIDE IV Inject into the vein.    . finasteride (PROSCAR) 5  MG tablet TAKE 1 TABLET BY MOUTH EVERY DAY (Patient taking differently: Take 5 mg by mouth every evening. ) 90 tablet 1  . Fluticasone-Umeclidin-Vilant (TRELEGY ELLIPTA) 100-62.5-25 MCG/INH AEPB Inhale 1 Inhaler into the lungs daily. 3 each 1  . furosemide (LASIX) 40 MG tablet Take 1 tablet (40 mg total) by mouth daily. 90 tablet 3  . ipratropium-albuterol (DUONEB) 0.5-2.5 (3) MG/3ML SOLN Take 3 mLs by nebulization every 2 (two) hours as needed (for SOB).     . Multiple Vitamin (MULTIVITAMIN) capsule Take 1 capsule by mouth daily.    . Nebulizer MISC Generic nebulizer plus supplies 1 each 0  . potassium chloride SA (K-DUR,KLOR-CON) 20 MEQ tablet Take 1 tablet (20 mEq total) by mouth daily. 30 tablet 1  . Probiotic Product (TRUBIOTICS PO) Take 1 tablet by mouth daily.    . prochlorperazine (COMPAZINE) 5 MG tablet Take 1 tablet (5 mg total) by mouth every 6 (six) hours as needed for nausea or vomiting. 30 tablet 0  . rosuvastatin (CRESTOR) 40 MG tablet Take 1 tablet (40 mg total) by mouth daily. 30 tablet 5  . senna-docusate (SENOKOT-S) 8.6-50 MG tablet Take 1 tablet by mouth at bedtime as needed for mild constipation. 10 tablet 0  . VENTOLIN HFA 108 (90 Base) MCG/ACT inhaler TAKE 2 PUFFS BY MOUTH EVERY 6 HOURS AS NEEDED FOR WHEEZE OR SHORTNESS OF BREATH (Patient taking differently: Inhale 2 puffs into the lungs every 6 (six) hours as needed for wheezing. ) 18 Inhaler 2  . celecoxib (CELEBREX) 200 MG capsule     . AMBULATORY NON FORMULARY MEDICATION Knee-high, medium compression, graduated compression stockings. Apply to lower extremities. [email or web address]ream[employer], Zippered Compression  Stockings, medium circ, long length 1 each 0  . HYDROcodone-acetaminophen (NORCO/VICODIN) 5-325 MG tablet Take 1-2 tablets by mouth every 4 (four) hours as needed for moderate pain. (Patient not taking: Reported on 04/03/2018) 30 tablet 0   No facility-administered medications prior to visit.     Allergies  Allergen Reactions  . Hydrochlorothiazide Other (See Comments)    Affected renal function.  "affected kidneys and calcium level"    ROS Review of Systems    Objective:    Physical Exam  Constitutional: He is oriented to person, place, and time. He appears well-developed and well-nourished.  HENT:  Head: Normocephalic and atraumatic.  Right Ear: External ear normal.  Left Ear: External ear normal.  Nose: Nose normal.  Mouth/Throat: Oropharynx is clear and moist.  TMs and canals are clear.   Eyes: Pupils are equal, round, and reactive to light. Conjunctivae and EOM are normal.  Neck: Neck supple. No thyromegaly present.  Cardiovascular: Normal rate, regular rhythm and normal heart sounds.  Pulmonary/Chest: Effort normal and breath sounds normal.  Abdominal: Soft. Bowel sounds are normal. He exhibits no distension and no mass. There is no abdominal tenderness. There is no rebound and no guarding.  Lymphadenopathy:    He has no cervical adenopathy.  Neurological: He is alert and oriented to person, place, and time.  Skin: Skin is warm and dry.  Psychiatric: He has a normal mood and affect. His behavior is normal.    BP (!) 136/54   Pulse 93   Ht 5' 9"  (1.753 m)   Wt 208 lb (94.3 kg)   SpO2 99% Comment: 2L  BMI 30.72 kg/m  Wt Readings from Last 3 Encounters:  04/13/18 208 lb (94.3 kg)  04/03/18 209 lb 12.8 oz (95.2 kg)  03/20/18 238 lb 1.6 oz (108 kg)  There are no preventive care reminders to display for this patient.  There are no preventive care reminders to display for this patient.  Lab Results  Component Value Date   TSH 2.582 03/14/2018   Lab  Results  Component Value Date   WBC 7.3 04/03/2018   HGB 9.0 (L) 04/03/2018   HCT 28.0 (L) 04/03/2018   MCV 93.3 04/03/2018   PLT 199 04/03/2018   Lab Results  Component Value Date   NA 142 04/05/2018   K 3.6 04/05/2018   CO2 31 04/05/2018   GLUCOSE 97 04/05/2018   BUN 17 04/05/2018   CREATININE 1.00 04/05/2018   BILITOT 1.4 (H) 04/03/2018   ALKPHOS 159 (H) 04/03/2018   AST 21 04/03/2018   ALT 14 04/03/2018   PROT 5.8 (L) 04/03/2018   ALBUMIN 3.1 (L) 04/03/2018   CALCIUM 9.0 04/05/2018   ANIONGAP 10 04/05/2018   Lab Results  Component Value Date   CHOL 104 07/11/2017   Lab Results  Component Value Date   HDL 35 (L) 07/11/2017   Lab Results  Component Value Date   LDLCALC 46 07/11/2017   Lab Results  Component Value Date   TRIG 145 07/11/2017   Lab Results  Component Value Date   CHOLHDL 3.0 07/11/2017   Lab Results  Component Value Date   HGBA1C 5.7 (A) 11/03/2017      Assessment & Plan:   Problem List Items Addressed This Visit      Cardiovascular and Mediastinum   Acute on chronic combined systolic and diastolic CHF (congestive heart failure) (HCC)     Respiratory   Multifocal pneumonia - Primary   Mixed restrictive and obstructive lung disease ([place])   Relevant Medications   AMBULATORY NON FORMULARY MEDICATION    Other Visit Diagnoses    Hypoxemia       Relevant Medications   AMBULATORY NON FORMULARY MEDICATION   Epigastric pain       Lower extremity edema         Pneumonia-resolved.  He completed his antibiotics and is feeling better overall.  No cough or increased sputum production.  Severe COPD with mild restriction-encouraged him to use his incentive spirometer more consistently.  Encouraged him to use it every time a commercial comes on TV.  He is not really been using his flutter valve either.  We will also work on trying to get him a portable come in engine compressor he is having to go back and forth to the cancer center quite  frequently and the travel tank is quite cumbersome and heavy and is having significant difficulty with it.  Lower extremity edema secondary to systolic heart failure-recommend increasing Lasix to 2 tabs every other day and 1 tab daily in between.  I think this would work well to control his increased fluid also make sure that he is taking potassium with that dosing.  Periumbilical pain-unclear etiology it seems to come and go he feels like his bowels are moving normally.  He is afebrile.  He is not had any blood in the stool.  He is completely nontender on exam is is not currently experiencing the discomfort.  Meds ordered this encounter  Medications  . AMBULATORY NON FORMULARY MEDICATION    Sig: Medication Name: portable O2 concentrator, (Inogen). Dx:J44.1.  He is oxygen dependent and having to go to chemotherapy appointments multiple times a week. Fax to [facility]    Dispense:  1 each    Refill:  0  Follow-up: Return in about 6 weeks (around 05/25/2018) for recheck .    Beatrice Lecher, MD

## 2018-04-14 ENCOUNTER — Other Ambulatory Visit: Payer: Self-pay | Admitting: Family Medicine

## 2018-04-16 DIAGNOSIS — C3490 Malignant neoplasm of unspecified part of unspecified bronchus or lung: Secondary | ICD-10-CM | POA: Diagnosis not present

## 2018-04-16 DIAGNOSIS — M47815 Spondylosis without myelopathy or radiculopathy, thoracolumbar region: Secondary | ICD-10-CM | POA: Diagnosis not present

## 2018-04-16 DIAGNOSIS — J439 Emphysema, unspecified: Secondary | ICD-10-CM | POA: Diagnosis not present

## 2018-04-16 DIAGNOSIS — I110 Hypertensive heart disease with heart failure: Secondary | ICD-10-CM | POA: Diagnosis not present

## 2018-04-16 DIAGNOSIS — R1311 Dysphagia, oral phase: Secondary | ICD-10-CM | POA: Diagnosis not present

## 2018-04-16 DIAGNOSIS — I5022 Chronic systolic (congestive) heart failure: Secondary | ICD-10-CM | POA: Diagnosis not present

## 2018-04-16 DIAGNOSIS — C7951 Secondary malignant neoplasm of bone: Secondary | ICD-10-CM | POA: Diagnosis not present

## 2018-04-16 DIAGNOSIS — I2510 Atherosclerotic heart disease of native coronary artery without angina pectoris: Secondary | ICD-10-CM | POA: Diagnosis not present

## 2018-04-16 DIAGNOSIS — J9691 Respiratory failure, unspecified with hypoxia: Secondary | ICD-10-CM | POA: Diagnosis not present

## 2018-04-17 ENCOUNTER — Other Ambulatory Visit: Payer: Self-pay | Admitting: Medical

## 2018-04-17 ENCOUNTER — Inpatient Hospital Stay (HOSPITAL_BASED_OUTPATIENT_CLINIC_OR_DEPARTMENT_OTHER): Payer: Medicare PPO | Admitting: Medical

## 2018-04-17 ENCOUNTER — Telehealth: Payer: Self-pay | Admitting: *Deleted

## 2018-04-17 ENCOUNTER — Ambulatory Visit (HOSPITAL_COMMUNITY)
Admission: RE | Admit: 2018-04-17 | Discharge: 2018-04-17 | Disposition: A | Discharge status: Home / Self Care | Payer: Medicare PPO | Source: Ambulatory Visit | Attending: Medical | Admitting: Medical

## 2018-04-17 ENCOUNTER — Inpatient Hospital Stay: Payer: Medicare PPO

## 2018-04-17 VITALS — BP 113/58 | HR 91 | Temp 98.1°F | Resp 18 | Ht 69.0 in | Wt 205.3 lb

## 2018-04-17 DIAGNOSIS — C801 Malignant (primary) neoplasm, unspecified: Secondary | ICD-10-CM

## 2018-04-17 DIAGNOSIS — R509 Fever, unspecified: Secondary | ICD-10-CM

## 2018-04-17 DIAGNOSIS — C7951 Secondary malignant neoplasm of bone: Secondary | ICD-10-CM | POA: Diagnosis not present

## 2018-04-17 DIAGNOSIS — R197 Diarrhea, unspecified: Secondary | ICD-10-CM

## 2018-04-17 DIAGNOSIS — I2510 Atherosclerotic heart disease of native coronary artery without angina pectoris: Secondary | ICD-10-CM | POA: Diagnosis not present

## 2018-04-17 DIAGNOSIS — J449 Chronic obstructive pulmonary disease, unspecified: Secondary | ICD-10-CM | POA: Diagnosis not present

## 2018-04-17 DIAGNOSIS — D6959 Other secondary thrombocytopenia: Secondary | ICD-10-CM

## 2018-04-17 DIAGNOSIS — D6481 Anemia due to antineoplastic chemotherapy: Secondary | ICD-10-CM | POA: Diagnosis not present

## 2018-04-17 DIAGNOSIS — D6189 Other specified aplastic anemias and other bone marrow failure syndromes: Secondary | ICD-10-CM

## 2018-04-17 DIAGNOSIS — D689 Coagulation defect, unspecified: Secondary | ICD-10-CM

## 2018-04-17 DIAGNOSIS — C3432 Malignant neoplasm of lower lobe, left bronchus or lung: Secondary | ICD-10-CM

## 2018-04-17 DIAGNOSIS — C3490 Malignant neoplasm of unspecified part of unspecified bronchus or lung: Secondary | ICD-10-CM | POA: Diagnosis not present

## 2018-04-17 DIAGNOSIS — N289 Disorder of kidney and ureter, unspecified: Secondary | ICD-10-CM

## 2018-04-17 DIAGNOSIS — Z9981 Dependence on supplemental oxygen: Secondary | ICD-10-CM

## 2018-04-17 LAB — CBC WITH DIFFERENTIAL (CANCER CENTER ONLY)
Abs Immature Granulocytes: 0.57 10*3/uL — ABNORMAL HIGH (ref 0.00–0.07)
BASOS ABS: 0 10*3/uL (ref 0.0–0.1)
Basophils Relative: 0 %
Eosinophils Absolute: 0 10*3/uL (ref 0.0–0.5)
Eosinophils Relative: 0 %
HCT: 23.6 % — ABNORMAL LOW (ref 39.0–52.0)
Hemoglobin: 7.4 g/dL — ABNORMAL LOW (ref 13.0–17.0)
Immature Granulocytes: 5 %
LYMPHS ABS: 1.3 10*3/uL (ref 0.7–4.0)
Lymphocytes Relative: 12 %
MCH: 29.6 pg (ref 26.0–34.0)
MCHC: 31.4 g/dL (ref 30.0–36.0)
MCV: 94.4 fL (ref 80.0–100.0)
Monocytes Absolute: 1.5 10*3/uL — ABNORMAL HIGH (ref 0.1–1.0)
Monocytes Relative: 14 %
NRBC: 0.4 % — AB (ref 0.0–0.2)
Neutro Abs: 7.3 10*3/uL (ref 1.7–7.7)
Neutrophils Relative %: 69 %
Platelet Count: 66 10*3/uL — ABNORMAL LOW (ref 150–400)
RBC: 2.5 MIL/uL — ABNORMAL LOW (ref 4.22–5.81)
RDW: 17 % — ABNORMAL HIGH (ref 11.5–15.5)
WBC Count: 10.7 10*3/uL — ABNORMAL HIGH (ref 4.0–10.5)

## 2018-04-17 LAB — BASIC METABOLIC PANEL - CANCER CENTER ONLY
Anion gap: 11 (ref 5–15)
BUN: 13 mg/dL (ref 8–23)
CO2: 28 mmol/L (ref 22–32)
Calcium: 9.6 mg/dL (ref 8.9–10.3)
Chloride: 100 mmol/L (ref 98–111)
Creatinine: 1.15 mg/dL (ref 0.61–1.24)
GFR, Est AFR Am: >60 mL/min (ref >60)
GFR, Estimated: 59 mL/min — ABNORMAL LOW (ref >60)
Glucose, Bld: 115 mg/dL — ABNORMAL HIGH (ref 70–99)
Potassium: 4 mmol/L (ref 3.5–5.1)
Sodium: 139 mmol/L (ref 135–145)

## 2018-04-17 LAB — MAGNESIUM: Magnesium: 1.5 mg/dL — ABNORMAL LOW (ref 1.7–2.4)

## 2018-04-17 NOTE — Patient Instructions (Signed)
PICC Home Care Guide ° °A peripherally inserted central catheter (PICC) is a form of IV access that allows medicines and IV fluids to be quickly distributed throughout the body. The PICC is a long, thin, flexible tube (catheter) that is inserted into a vein in the upper arm. The catheter ends in a large vein in the chest (superior vena cava, or SVC). After the PICC is inserted, a chest X-ray may be done to make sure that it is in the correct place. °A PICC may be placed for different reasons, such as: °· To give medicines and liquid nutrition. °· To give IV fluids and blood products. °· If there is trouble placing a peripheral intravenous (PIV) catheter. °If taken care of properly, a PICC can remain in place for several months. Having a PICC can also allow a person to go home from the hospital sooner. Medicine and PICC care can be managed at home by a family member, caregiver, or home health care team. °What are the risks? °Generally, having a PICC is safe. However, problems may occur, including: °· A blood clot (thrombus) forming in or at the tip of the PICC. °· A blood clot forming in a vein (deep vein thrombosis) or traveling to the lung (pulmonary embolism). °· Inflammation of the vein (phlebitis) in which the PICC is placed. °· Infection. Central line associated blood stream infection (CLABSI) is a serious infection that often requires hospitalization. °· PICC movement (malposition). The PICC tip may move from its original position due to excessive physical activity, forceful coughing, sneezing, or vomiting. °· A break or cut in the PICC. It is important not to use scissors near the PICC. °· Nerve or tendon irritation or injury during PICC insertion. °How to take care of your PICC °Preventing problems °· You and any caregivers should wash your hands often with soap. Wash hands: °? Before touching the PICC line or the infusion device. °? Before changing a bandage (dressing). °· Flush the PICC as told by your  health care provider. Let your health care provider know right away if the PICC is hard to flush or does not flush. Do not use force to flush the PICC. °· Do not use a syringe that is less than 10 mL to flush the PICC. °· Avoid blood pressure checks on the arm in which the PICC is placed. °· Never pull or tug on the PICC. °· Do not take the PICC out yourself. Only a trained clinical professional should remove the PICC. °· Use clean and sterile supplies only. Keep the supplies in a dry place. Do not reuse needles, syringes, or any other supplies. Doing that can lead to infection. °· Keep pets and children away from your PICC line. °· Check the PICC insertion site every day for signs of infection. Check for: °? Leakage. °? Redness, swelling, or pain. °? Fluid or blood. °? Warmth. °? Pus or a bad smell. °PICC dressing care °· Keep your PICC bandage (dressing) clean and dry to prevent infection. °· Do not take baths, swim, or use a hot tub until your health care provider approves. Ask your health care provider if you can take showers. You may only be allowed to take sponge baths for bathing. When you are allowed to shower: °? Ask your health care provider to teach you how to wrap the PICC line. °? Cover the PICC line with clear plastic wrap and tape to keep it dry while showering. °· Follow instructions from your health care provider   about how to take care of your insertion site and dressing. Make sure you: °? Wash your hands with soap and water before you change your bandage (dressing). If soap and water are not available, use hand sanitizer. °? Change your dressing as told by your health care provider. °? Leave stitches (sutures), skin glue, or adhesive strips in place. These skin closures may need to stay in place for 2 weeks or longer. If adhesive strip edges start to loosen and curl up, you may trim the loose edges. Do not remove adhesive strips completely unless your health care provider tells you to do  that. °· Change your PICC dressing if it becomes loose or wet. °General instructions ° °· Carry your PICC identification card or wear a medical alert bracelet at all times. °· Keep the tube clamped at all times, unless it is being used. °· Carry a smooth-edge clamp with you at all times to place on the tube if it breaks. °· Do not use scissors or sharp objects near the tube. °· You may bend your arm and move it freely. If your PICC is near or at the bend of your elbow, avoid activity with repeated motion at the elbow. °· Avoid lifting heavy objects as told by your health care provider. °· Keep all follow-up visits as told by your health care provider. This is important. °Disposal of supplies °· Throw away any syringes in a disposal container that is meant for sharp items (sharps container). You can buy a sharps container from a pharmacy, or you can make one by using an empty hard plastic bottle with a cover. °· Place any used dressings or infusion bags into a plastic bag. Throw that bag in the trash. °Contact a health care provider if: °· You have pain in your arm, ear, face, or teeth. °· You have a fever or chills. °· You have redness, swelling, or pain around the insertion site. °· You have fluid or blood coming from the insertion site. °· Your insertion site feels warm to the touch. °· You have pus or a bad smell coming from the insertion site. °· Your skin feels hard and raised around the insertion site. °Get help right away if: °· Your PICC is accidentally pulled all the way out. If this happens, cover the insertion site with a bandage or gauze dressing. Do not throw the PICC away. Your health care provider will need to check it. °· Your PICC was tugged or pulled and has partially come out. Do not  push the PICC back in. °· You cannot flush the PICC, it is hard to flush, or the PICC leaks around the insertion site when it is flushed. °· You hear a "flushing" sound when the PICC is flushed. °· You feel your  heart racing or skipping beats. °· There is a hole or tear in the PICC. °· You have swelling in the arm in which the PICC was inserted. °· You have a red streak going up your arm from where the PICC was inserted. °Summary °· A peripherally inserted central catheter (PICC) is a long, thin, flexible tube (catheter) that is inserted into a vein in the upper arm. °· The PICC is inserted using a sterile technique by a specially trained nurse or physician. Only a trained clinical professional should remove it. °· Keep your PICC identification card with you at all times. °· Avoid blood pressure checks on the arm in which the PICC is placed. °· If cared for   properly, a PICC can remain in place for several months. Having a PICC can also allow a person to go home from the hospital sooner. °This information is not intended to replace advice given to you by your health care provider. Make sure you discuss any questions you have with your health care provider. °Document Released: 07/31/2002 Document Revised: 02/27/2016 Document Reviewed: 02/27/2016 °Elsevier Interactive Patient Education © 2019 [employer] ° °

## 2018-04-17 NOTE — Telephone Encounter (Signed)
 "  Joseph Berg's wife calling to report he had two brown runny stools last night.    When he had the first episode his temperature was 101.2 .  At 9:20 am his Temp = 100.5 .  No tylenol or medications.  Now says he's cold and chills.    No abdominal swelling, tenderness or pain. Dry occasional cough last night.  No trouble urinating but he did c/o back and legs last night.     Ate barbeque, green beans, potato salad for dinner."

## 2018-04-17 NOTE — Progress Notes (Signed)
Pt presents today after having severe nausea, diarrhea, fatigue, and fevers of 100.5-101 last night.  Pt reports having dinner then experiencing watery brown stools, abdominal cramping, and nausea w/out vomiting.  Currently afebrile, states "I feel ok now, I just wanted to make sure I was okay".  VSS.  Denies CP or SOB.  A&Ox4.  Ambulatory w/assistance & oxygen.  Attempted to get urine sample.  Sample contaminated per lab d/t improper collection by patient.  PA Lucianne Lei aware, VO to discontinue urine at this time.  Only one set of blood cultures drawn from pt peripherally along with other labs.  [facility] RN to draw second set from PICC line as indicated by PA Lucianne Lei.  Receive VO from [place] to discontinue second set of blood cultures d/t pt's stable VS & labs at this time, as well as VO to not draw a sample for blood bank as a result of pt's Hgb.  Labs & CXR from 04/17/2018 reviewed by PA Sandi Mealy and MD Benay Spice.  Pt to have port placed on 04/19/2018.  PICC line dressing due on 04/18/2018, no appts between [facility] visit on 04/17/2018 and port placement appt.  Pt states that since he is getting his PICC removed on 04/19/2018 after his port is placed he doesn't need to get his PICC dressing changed and would prefer to wait until it's removed instead.  Dressing C/D/I, antimicrobial disc in place.

## 2018-04-17 NOTE — Telephone Encounter (Signed)
"  Joseph Berg [phone]).  My husband was up al night with Diarrhea.  Temperature = 101 F.  Scheduled for lab today.  Does he need to see Dr. Benay Spice"

## 2018-04-18 ENCOUNTER — Telehealth: Payer: Self-pay | Admitting: Emergency Medicine

## 2018-04-18 ENCOUNTER — Telehealth: Payer: Self-pay | Admitting: *Deleted

## 2018-04-18 ENCOUNTER — Other Ambulatory Visit: Payer: Self-pay | Admitting: Physician Assistant

## 2018-04-18 DIAGNOSIS — I2510 Atherosclerotic heart disease of native coronary artery without angina pectoris: Secondary | ICD-10-CM | POA: Diagnosis not present

## 2018-04-18 DIAGNOSIS — I5022 Chronic systolic (congestive) heart failure: Secondary | ICD-10-CM | POA: Diagnosis not present

## 2018-04-18 DIAGNOSIS — C7951 Secondary malignant neoplasm of bone: Secondary | ICD-10-CM | POA: Diagnosis not present

## 2018-04-18 DIAGNOSIS — I110 Hypertensive heart disease with heart failure: Secondary | ICD-10-CM | POA: Diagnosis not present

## 2018-04-18 DIAGNOSIS — J439 Emphysema, unspecified: Secondary | ICD-10-CM | POA: Diagnosis not present

## 2018-04-18 DIAGNOSIS — M47815 Spondylosis without myelopathy or radiculopathy, thoracolumbar region: Secondary | ICD-10-CM | POA: Diagnosis not present

## 2018-04-18 DIAGNOSIS — R1311 Dysphagia, oral phase: Secondary | ICD-10-CM | POA: Diagnosis not present

## 2018-04-18 DIAGNOSIS — C3490 Malignant neoplasm of unspecified part of unspecified bronchus or lung: Secondary | ICD-10-CM | POA: Diagnosis not present

## 2018-04-18 DIAGNOSIS — J9691 Respiratory failure, unspecified with hypoxia: Secondary | ICD-10-CM | POA: Diagnosis not present

## 2018-04-18 NOTE — Telephone Encounter (Signed)
Following up on call from pt/his wife today.  RN Learta Codding and PA Lucianne Lei reviewed notes/pt's chart.  Pt's wife reports that pt doesn't appear to currently have a fever after administering tylenol and imodium, states "our thermometer doesn't always work well".  Pt denies any current issues or concerns.  Per PA Lucianne Lei pt/his wife advised to recheck his temperature in the morning and see how he feels, then to call the [facility] around 0900 to let us know the outcome.  If there are any changes in temperature or overall disposition they are advised to contact the triage line overnight or visit the ER as needed.  Pt/his wife VU of instructions.  Pt may require PICC dressing change during appt tomorrow since IR appt was moved on 04/23/2018 for port placement.

## 2018-04-18 NOTE — Telephone Encounter (Signed)
FYI "Today's nurse was concerned about hs B/P, temperature and maybe dehydrated.  I think his B/P was low yesterday.     B/P = 118/50, P = 80 to 90, O2 Sat = 97%.  Follows up with Dr. Stanford Breed who ordered Coreg in June.    Nurse said take tylenol for temperature.  Had a little diarrhea last night but he would not take Imodium.    Threw up about two spits or a third cup clear water after drinking water.  This went away after I gave a nausea pill.   Should I get Pedialyte?  Will he be able to have port-a-cath placed tommorrow?"  Try Gatorade or [employer] drinks.  IR will call if needed tomorrow.  Any provider orders or instructions received will be communicated upon receipt.

## 2018-04-18 NOTE — Progress Notes (Signed)
These preliminary result these preliminary results were noted.  Awaiting final report.

## 2018-04-18 NOTE — Telephone Encounter (Signed)
FYI "JORIEL STREETY wife Helene Kelp [phone]).  Last night Marshal had some diarrhea.  Now his temperature is 101.2.  Kennon Rounds was seen yesterday."  Provided Helene Kelp with [facility] recommendation to proceed with today's home health evaluation and call with any changes or needs per assessment. Helene Kelp reports "continuing checking temperature with oxygen off.  Using oral thermometer.  I don't know what's going on with him."

## 2018-04-19 ENCOUNTER — Inpatient Hospital Stay: Payer: Medicare PPO | Admitting: Medical

## 2018-04-19 ENCOUNTER — Ambulatory Visit (HOSPITAL_COMMUNITY): Payer: Medicare PPO

## 2018-04-19 ENCOUNTER — Other Ambulatory Visit: Payer: Self-pay

## 2018-04-19 ENCOUNTER — Telehealth: Payer: Self-pay | Admitting: *Deleted

## 2018-04-19 ENCOUNTER — Inpatient Hospital Stay (HOSPITAL_COMMUNITY)
Admission: EM | Admit: 2018-04-19 | Discharge: 2018-04-21 | DRG: 194 | Disposition: A | Discharge status: Home Health | Payer: Medicare PPO | Attending: Internal Medicine | Admitting: Internal Medicine

## 2018-04-19 ENCOUNTER — Encounter (HOSPITAL_COMMUNITY): Payer: Self-pay | Admitting: Emergency Medicine

## 2018-04-19 ENCOUNTER — Emergency Department (HOSPITAL_COMMUNITY): Payer: Medicare PPO

## 2018-04-19 ENCOUNTER — Other Ambulatory Visit: Payer: Self-pay | Admitting: Medical

## 2018-04-19 ENCOUNTER — Inpatient Hospital Stay (HOSPITAL_BASED_OUTPATIENT_CLINIC_OR_DEPARTMENT_OTHER): Payer: Medicare PPO | Admitting: Medical

## 2018-04-19 ENCOUNTER — Telehealth: Payer: Self-pay | Admitting: Emergency Medicine

## 2018-04-19 ENCOUNTER — Inpatient Hospital Stay (HOSPITAL_COMMUNITY): Admission: RE | Admit: 2018-04-19 | Payer: Medicare PPO | Source: Ambulatory Visit

## 2018-04-19 VITALS — BP 92/54 | HR 97 | Temp 99.0°F | Resp 24 | Wt 202.6 lb

## 2018-04-19 DIAGNOSIS — J181 Lobar pneumonia, unspecified organism: Secondary | ICD-10-CM | POA: Diagnosis not present

## 2018-04-19 DIAGNOSIS — N400 Enlarged prostate without lower urinary tract symptoms: Secondary | ICD-10-CM | POA: Diagnosis present

## 2018-04-19 DIAGNOSIS — Z9981 Dependence on supplemental oxygen: Secondary | ICD-10-CM

## 2018-04-19 DIAGNOSIS — C3432 Malignant neoplasm of lower lobe, left bronchus or lung: Secondary | ICD-10-CM

## 2018-04-19 DIAGNOSIS — D649 Anemia, unspecified: Secondary | ICD-10-CM

## 2018-04-19 DIAGNOSIS — J439 Emphysema, unspecified: Secondary | ICD-10-CM

## 2018-04-19 DIAGNOSIS — C801 Malignant (primary) neoplasm, unspecified: Secondary | ICD-10-CM

## 2018-04-19 DIAGNOSIS — I5022 Chronic systolic (congestive) heart failure: Secondary | ICD-10-CM | POA: Diagnosis not present

## 2018-04-19 DIAGNOSIS — R1311 Dysphagia, oral phase: Secondary | ICD-10-CM | POA: Diagnosis not present

## 2018-04-19 DIAGNOSIS — C7952 Secondary malignant neoplasm of bone marrow: Secondary | ICD-10-CM

## 2018-04-19 DIAGNOSIS — D638 Anemia in other chronic diseases classified elsewhere: Secondary | ICD-10-CM | POA: Diagnosis present

## 2018-04-19 DIAGNOSIS — J9691 Respiratory failure, unspecified with hypoxia: Secondary | ICD-10-CM | POA: Diagnosis not present

## 2018-04-19 DIAGNOSIS — J189 Pneumonia, unspecified organism: Secondary | ICD-10-CM | POA: Diagnosis not present

## 2018-04-19 DIAGNOSIS — Z8249 Family history of ischemic heart disease and other diseases of the circulatory system: Secondary | ICD-10-CM

## 2018-04-19 DIAGNOSIS — I252 Old myocardial infarction: Secondary | ICD-10-CM

## 2018-04-19 DIAGNOSIS — J984 Other disorders of lung: Secondary | ICD-10-CM

## 2018-04-19 DIAGNOSIS — J449 Chronic obstructive pulmonary disease, unspecified: Secondary | ICD-10-CM | POA: Diagnosis not present

## 2018-04-19 DIAGNOSIS — Z720 Tobacco use: Secondary | ICD-10-CM | POA: Diagnosis not present

## 2018-04-19 DIAGNOSIS — C3490 Malignant neoplasm of unspecified part of unspecified bronchus or lung: Secondary | ICD-10-CM | POA: Diagnosis not present

## 2018-04-19 DIAGNOSIS — Z9221 Personal history of antineoplastic chemotherapy: Secondary | ICD-10-CM

## 2018-04-19 DIAGNOSIS — R509 Fever, unspecified: Secondary | ICD-10-CM

## 2018-04-19 DIAGNOSIS — I2510 Atherosclerotic heart disease of native coronary artery without angina pectoris: Secondary | ICD-10-CM | POA: Diagnosis not present

## 2018-04-19 DIAGNOSIS — Z951 Presence of aortocoronary bypass graft: Secondary | ICD-10-CM

## 2018-04-19 DIAGNOSIS — C7951 Secondary malignant neoplasm of bone: Secondary | ICD-10-CM | POA: Diagnosis not present

## 2018-04-19 DIAGNOSIS — G4733 Obstructive sleep apnea (adult) (pediatric): Secondary | ICD-10-CM | POA: Diagnosis present

## 2018-04-19 DIAGNOSIS — I4510 Unspecified right bundle-branch block: Secondary | ICD-10-CM | POA: Diagnosis not present

## 2018-04-19 DIAGNOSIS — I959 Hypotension, unspecified: Secondary | ICD-10-CM

## 2018-04-19 DIAGNOSIS — Z66 Do not resuscitate: Secondary | ICD-10-CM | POA: Diagnosis present

## 2018-04-19 DIAGNOSIS — E785 Hyperlipidemia, unspecified: Secondary | ICD-10-CM | POA: Diagnosis present

## 2018-04-19 DIAGNOSIS — M47815 Spondylosis without myelopathy or radiculopathy, thoracolumbar region: Secondary | ICD-10-CM | POA: Diagnosis not present

## 2018-04-19 DIAGNOSIS — Z888 Allergy status to other drugs, medicaments and biological substances status: Secondary | ICD-10-CM

## 2018-04-19 DIAGNOSIS — Y95 Nosocomial condition: Secondary | ICD-10-CM | POA: Diagnosis present

## 2018-04-19 DIAGNOSIS — I110 Hypertensive heart disease with heart failure: Secondary | ICD-10-CM | POA: Diagnosis not present

## 2018-04-19 DIAGNOSIS — J440 Chronic obstructive pulmonary disease with acute lower respiratory infection: Secondary | ICD-10-CM | POA: Diagnosis present

## 2018-04-19 DIAGNOSIS — Z79899 Other long term (current) drug therapy: Secondary | ICD-10-CM

## 2018-04-19 DIAGNOSIS — E876 Hypokalemia: Secondary | ICD-10-CM | POA: Diagnosis present

## 2018-04-19 LAB — CBC WITH DIFFERENTIAL (CANCER CENTER ONLY)
Abs Immature Granulocytes: 0.61 10*3/uL — ABNORMAL HIGH (ref 0.00–0.07)
Basophils Absolute: 0 10*3/uL (ref 0.0–0.1)
Basophils Relative: 0 %
Eosinophils Absolute: 0 10*3/uL (ref 0.0–0.5)
Eosinophils Relative: 0 %
HCT: 21.9 % — ABNORMAL LOW (ref 39.0–52.0)
Hemoglobin: 7 g/dL — ABNORMAL LOW (ref 13.0–17.0)
Immature Granulocytes: 4 %
LYMPHS ABS: 1.1 10*3/uL (ref 0.7–4.0)
Lymphocytes Relative: 7 %
MCH: 30.2 pg (ref 26.0–34.0)
MCHC: 32 g/dL (ref 30.0–36.0)
MCV: 94.4 fL (ref 80.0–100.0)
Monocytes Absolute: 1.7 10*3/uL — ABNORMAL HIGH (ref 0.1–1.0)
Monocytes Relative: 10 %
Neutro Abs: 13.4 10*3/uL — ABNORMAL HIGH (ref 1.7–7.7)
Neutrophils Relative %: 79 %
Platelet Count: 143 10*3/uL — ABNORMAL LOW (ref 150–400)
RBC: 2.32 MIL/uL — ABNORMAL LOW (ref 4.22–5.81)
RDW: 17.2 % — AB (ref 11.5–15.5)
WBC Count: 16.9 10*3/uL — ABNORMAL HIGH (ref 4.0–10.5)
nRBC: 0.3 % — ABNORMAL HIGH (ref 0.0–0.2)

## 2018-04-19 LAB — BRAIN NATRIURETIC PEPTIDE: B NATRIURETIC PEPTIDE 5: 237.2 pg/mL — AB (ref 0.0–100.0)

## 2018-04-19 LAB — COMPREHENSIVE METABOLIC PANEL
ALK PHOS: 93 U/L (ref 38–126)
ALT: 42 U/L (ref 0–44)
AST: 34 U/L (ref 15–41)
Albumin: 3.1 g/dL — ABNORMAL LOW (ref 3.5–5.0)
Anion gap: 9 (ref 5–15)
BILIRUBIN TOTAL: 1 mg/dL (ref 0.3–1.2)
BUN: 17 mg/dL (ref 8–23)
CO2: 26 mmol/L (ref 22–32)
Calcium: 8.6 mg/dL — ABNORMAL LOW (ref 8.9–10.3)
Chloride: 103 mmol/L (ref 98–111)
Creatinine, Ser: 1.15 mg/dL (ref 0.61–1.24)
GFR calc Af Amer: >60 mL/min (ref >60)
GFR calc non Af Amer: 59 mL/min — ABNORMAL LOW (ref >60)
Glucose, Bld: 127 mg/dL — ABNORMAL HIGH (ref 70–99)
Potassium: 3.3 mmol/L — ABNORMAL LOW (ref 3.5–5.1)
Sodium: 138 mmol/L (ref 135–145)
TOTAL PROTEIN: 6.1 g/dL — AB (ref 6.5–8.1)

## 2018-04-19 LAB — PROTIME-INR
INR: 1.3 — ABNORMAL HIGH (ref 0.8–1.2)
Prothrombin Time: 16.1 seconds — ABNORMAL HIGH (ref 11.4–15.2)

## 2018-04-19 LAB — CBC WITH DIFFERENTIAL/PLATELET
Abs Immature Granulocytes: 0.57 10*3/uL — ABNORMAL HIGH (ref 0.00–0.07)
BASOS PCT: 0 %
Basophils Absolute: 0 10*3/uL (ref 0.0–0.1)
Eosinophils Absolute: 0 10*3/uL (ref 0.0–0.5)
Eosinophils Relative: 0 %
HCT: 21.5 % — ABNORMAL LOW (ref 39.0–52.0)
Hemoglobin: 6.6 g/dL — CL (ref 13.0–17.0)
Immature Granulocytes: 3 %
Lymphocytes Relative: 8 %
Lymphs Abs: 1.4 10*3/uL (ref 0.7–4.0)
MCH: 29.5 pg (ref 26.0–34.0)
MCHC: 30.7 g/dL (ref 30.0–36.0)
MCV: 96 fL (ref 80.0–100.0)
Monocytes Absolute: 1.7 10*3/uL — ABNORMAL HIGH (ref 0.1–1.0)
Monocytes Relative: 10 %
Neutro Abs: 14.3 10*3/uL — ABNORMAL HIGH (ref 1.7–7.7)
Neutrophils Relative %: 79 %
Platelets: 157 10*3/uL (ref 150–400)
RBC: 2.24 MIL/uL — ABNORMAL LOW (ref 4.22–5.81)
RDW: 17.2 % — ABNORMAL HIGH (ref 11.5–15.5)
WBC: 18.1 10*3/uL — ABNORMAL HIGH (ref 4.0–10.5)
nRBC: 0.2 % (ref 0.0–0.2)

## 2018-04-19 LAB — SAMPLE TO BLOOD BANK

## 2018-04-19 LAB — PREPARE RBC (CROSSMATCH)

## 2018-04-19 LAB — INFLUENZA PANEL BY PCR (TYPE A & B)
INFLBPCR: NEGATIVE
Influenza A By PCR: NEGATIVE

## 2018-04-19 LAB — ABO/RH: ABO/RH(D): O POS

## 2018-04-19 LAB — I-STAT TROPONIN, ED: Troponin i, poc: 0.01 ng/mL (ref 0.00–0.08)

## 2018-04-19 LAB — MAGNESIUM: Magnesium: 1.5 mg/dL — ABNORMAL LOW (ref 1.7–2.4)

## 2018-04-19 MED ORDER — POTASSIUM CHLORIDE CRYS ER 20 MEQ PO TBCR
40.0000 meq | EXTENDED_RELEASE_TABLET | Freq: Once | ORAL | Status: AC
  Administered 2018-04-19: 40 meq via ORAL
  Filled 2018-04-19: qty 2

## 2018-04-19 MED ORDER — SODIUM CHLORIDE 0.9 % IV SOLN
1.0000 g | Freq: Three times a day (TID) | INTRAVENOUS | Status: DC
  Administered 2018-04-20 – 2018-04-21 (×5): 1 g via INTRAVENOUS
  Filled 2018-04-19 (×6): qty 1

## 2018-04-19 MED ORDER — TRAZODONE HCL 50 MG PO TABS
25.0000 mg | ORAL_TABLET | Freq: Every evening | ORAL | Status: DC | PRN
  Administered 2018-04-19 – 2018-04-20 (×2): 25 mg via ORAL
  Filled 2018-04-19 (×2): qty 1

## 2018-04-19 MED ORDER — UMECLIDINIUM BROMIDE 62.5 MCG/INH IN AEPB
1.0000 | INHALATION_SPRAY | Freq: Every day | RESPIRATORY_TRACT | Status: DC
  Administered 2018-04-20 – 2018-04-21 (×2): 1 via RESPIRATORY_TRACT
  Filled 2018-04-19: qty 7

## 2018-04-19 MED ORDER — FUROSEMIDE 10 MG/ML IJ SOLN
20.0000 mg | Freq: Once | INTRAMUSCULAR | Status: AC
  Administered 2018-04-19: 20 mg via INTRAVENOUS
  Filled 2018-04-19: qty 2

## 2018-04-19 MED ORDER — METHYLPREDNISOLONE SODIUM SUCC 125 MG IJ SOLR
60.0000 mg | Freq: Two times a day (BID) | INTRAMUSCULAR | Status: DC
  Administered 2018-04-19 – 2018-04-21 (×4): 60 mg via INTRAVENOUS
  Filled 2018-04-19 (×4): qty 2

## 2018-04-19 MED ORDER — IPRATROPIUM-ALBUTEROL 0.5-2.5 (3) MG/3ML IN SOLN: 3.0000 mL | RESPIRATORY_TRACT | Status: DC | PRN

## 2018-04-19 MED ORDER — MAGNESIUM SULFATE 50 % IJ SOLN
3.0000 g | Freq: Once | INTRAVENOUS | Status: AC
  Administered 2018-04-19: 3 g via INTRAVENOUS
  Filled 2018-04-19: qty 6

## 2018-04-19 MED ORDER — FINASTERIDE 5 MG PO TABS
5.0000 mg | ORAL_TABLET | Freq: Every evening | ORAL | Status: DC
  Administered 2018-04-19 – 2018-04-20 (×2): 5 mg via ORAL
  Filled 2018-04-19 (×2): qty 1

## 2018-04-19 MED ORDER — ROSUVASTATIN CALCIUM 20 MG PO TABS
40.0000 mg | ORAL_TABLET | Freq: Every day | ORAL | Status: DC
  Administered 2018-04-20 – 2018-04-21 (×2): 40 mg via ORAL
  Filled 2018-04-19 (×2): qty 2

## 2018-04-19 MED ORDER — HYDROCODONE-ACETAMINOPHEN 5-325 MG PO TABS
1.0000 | ORAL_TABLET | Freq: Once | ORAL | Status: AC
  Administered 2018-04-19: 1 via ORAL
  Filled 2018-04-19: qty 1

## 2018-04-19 MED ORDER — ACETAMINOPHEN 325 MG PO TABS
650.0000 mg | ORAL_TABLET | Freq: Once | ORAL | Status: AC
  Administered 2018-04-19: 650 mg via ORAL
  Filled 2018-04-19: qty 2

## 2018-04-19 MED ORDER — SODIUM CHLORIDE 0.9 % IV SOLN
2.0000 g | Freq: Once | INTRAVENOUS | Status: AC
  Administered 2018-04-19: 2 g via INTRAVENOUS
  Filled 2018-04-19: qty 2

## 2018-04-19 MED ORDER — SODIUM CHLORIDE 0.9% IV SOLUTION: 250.0000 mL | Freq: Once | INTRAVENOUS | Status: DC

## 2018-04-19 MED ORDER — VANCOMYCIN HCL 10 G IV SOLR
1750.0000 mg | Freq: Once | INTRAVENOUS | Status: AC
  Administered 2018-04-19: 1750 mg via INTRAVENOUS
  Filled 2018-04-19: qty 1750

## 2018-04-19 MED ORDER — FLUTICASONE FUROATE-VILANTEROL 100-25 MCG/INH IN AEPB
1.0000 | INHALATION_SPRAY | Freq: Every day | RESPIRATORY_TRACT | Status: DC
  Administered 2018-04-20 – 2018-04-21 (×2): 1 via RESPIRATORY_TRACT
  Filled 2018-04-19: qty 28

## 2018-04-19 MED ORDER — VANCOMYCIN HCL IN DEXTROSE 1-5 GM/200ML-% IV SOLN
1000.0000 mg | INTRAVENOUS | Status: DC
  Administered 2018-04-20: 1000 mg via INTRAVENOUS
  Filled 2018-04-19: qty 200

## 2018-04-19 MED ORDER — FLUTICASONE-UMECLIDIN-VILANT 100-62.5-25 MCG/INH IN AEPB: 1.0000 | INHALATION_SPRAY | Freq: Every day | RESPIRATORY_TRACT | Status: DC

## 2018-04-19 MED ORDER — SODIUM CHLORIDE 0.9% IV SOLUTION
Freq: Once | INTRAVENOUS | Status: AC
  Administered 2018-04-19: 18:00:00 via INTRAVENOUS

## 2018-04-19 NOTE — Progress Notes (Signed)
ED TO INPATIENT HANDOFF REPORT  ED Nurse Name and Phone #: Davy Pique [phone]  S Name/Age/Gender Joseph Berg 85 y.o. male Room/Bed: WOTF/NONE  Code Status   Code Status: Prior  Home/SNF/Other Home A&Ox4 Is this baseline? Yes   Triage Complete: Triage complete  Chief Complaint hypotensive;anemic from cancer center  Triage Note Patient has had a low grade fever since Monday and last night his temperature was 101. He presents today from home with shortness of breath and pursed lip breathing. Monday he had a chest xray done which showed a tumor in the lower lobe, his hemoglobin was a 7.4. Today his hemoglobin is 7. He is chronically on 2 L of O2 but last night his oxygen had to increased due to saturations dropping in the 80's.    Cancer center vitals: 92/54 BP 24 Resp rate 92% O2 sats on 2L     Allergies Allergies  Allergen Reactions  . Hydrochlorothiazide Other (See Comments)    Affected renal function.  "affected kidneys and calcium level"    Level of Care/Admitting Diagnosis ED Disposition    ED Disposition Condition [facility]: [place] [100102]  Level of Care: Telemetry [5]  Admit to tele based on following criteria: Other see comments  Comments: hcap  Diagnosis: HCAP (healthcare-associated pneumonia) [517616]  Admitting Physician: Hosie Poisson [[id number]]  Attending Physician: Hosie Poisson [[id number]]  PT Class (Do Not Modify): Observation [104]  PT Acc Code (Do Not Modify): Observation [10022]       B Medical/Surgery History Past Medical History:  Diagnosis Date  . BPH (benign prostatic hyperplasia)   . CAD (coronary artery disease)    a. s/p CABG x1 with LIMA-LAD 1994. b. LM & 3v CAD by cath 07/2012  . CHF (congestive heart failure) ([place])    a. EF 35-40% by echo 07/2012.  [name] Complication of anesthesia    affected patients memory. Pt stated "I couldn't remember anything for three months...just bits and pieces"   . Emphysema    a. Moderate emphysema by CT 06/2012.  [name] Heart attack ([place]) 1994  . Hyperlipidemia   . Hypertension   . Hypoparathyroidism ([place])   . NSVT (nonsustained ventricular tachycardia) ([facility])    a. Seen on tele 07/2012.  . OSA on CPAP    pt stated he does not wear CPAP because he no longer has sleep apnea  . Pneumonia   . Pseudoaneurysm of left femoral artery ([place])    a. post cath s/p compression 07/2012, associated w/ anemia.  . Sciatica 2012   Qualifier: Diagnosis of  By: Madilyn Fireman MD, Barnetta Chapel     Past Surgical History:  Procedure Laterality Date  . CATARACT EXTRACTION, BILATERAL    . COLONOSCOPY    . CORONARY ARTERY BYPASS GRAFT  03-23-92  . CORONARY ARTERY BYPASS GRAFT N/A 08/14/2012   Procedure: REDO CORONARY ARTERY BYPASS GRAFTING (CABG);  Surgeon: Gaye Pollack, MD;  Location: [facility];  Service: Open Heart Surgery;  Laterality: N/A;  . INTRAOPERATIVE TRANSESOPHAGEAL ECHOCARDIOGRAM N/A 08/14/2012   Procedure: INTRAOPERATIVE TRANSESOPHAGEAL ECHOCARDIOGRAM;  Surgeon: Gaye Pollack, MD;  Location: [facility];  Service: Open Heart Surgery;  Laterality: N/A;  . LUMBAR LAMINECTOMY/DECOMPRESSION MICRODISCECTOMY Left 02/18/2015   Procedure: LUMBAR LAMINECTOMY/DECOMPRESSION MICRODISCECTOMY- LEFT FOUR-FIVE ;  Surgeon: Ashok Pall, MD;  Location: [facility];  Service: Neurosurgery;  Laterality: Left;     A IV Location/Drains/Wounds Patient Lines/Drains/Airways Status   Active Line/Drains/Airways    Name:  Placement date:   Placement time:   Site:   Days:   Peripheral IV 04/19/18 Left Forearm   04/19/18    1750    Forearm   less than 1   PICC Double Lumen 07/31/74 PICC Right Basilic 42 cm 0 cm   28/31/51    1550     42   External Urinary Catheter   03/06/18    1230    -   44   Incision 08/14/12 Chest Other (Comment)   08/14/12    0943     2074   Incision 08/14/12 Leg Right   08/14/12    0946     2074   Incision (Closed) 02/18/15 Back Other (Comment)   02/18/15    1257     1156    Incision (Closed) 03/07/18 Back Right   03/07/18    1050     43          Intake/Output Last 24 hours  Intake/Output Summary (Last 24 hours) at 04/19/2018 1920 Last data filed at 04/19/2018 1807 Gross per 24 hour  Intake 126.64 ml  Output -  Net 126.64 ml    Labs/Imaging Results for orders placed or performed during the hospital encounter of 04/19/18 (from the past 48 hour(s))  I-Stat Troponin, ED (not at [facility])     Status: None   Collection Time: 04/19/18  4:14 PM  Result Value Ref Range   Troponin i, poc 0.01 0.00 - 0.08 ng/mL   Comment 3            Comment: Due to the release kinetics of cTnI, a negative result within the first hours of the onset of symptoms does not rule out myocardial infarction with certainty. If myocardial infarction is still suspected, repeat the test at appropriate intervals.   Influenza panel by PCR (type A & B)     Status: None   Collection Time: 04/19/18  4:30 PM  Result Value Ref Range   Influenza A By PCR NEGATIVE NEGATIVE   Influenza B By PCR NEGATIVE NEGATIVE    Comment: (NOTE) The Xpert Xpress Flu assay is intended as an aid in the diagnosis of  influenza and should not be used as a sole basis for treatment.  This  assay is FDA approved for nasopharyngeal swab specimens only. Nasal  washings and aspirates are unacceptable for Xpert Xpress Flu testing. Performed at [facility], [place] [address] [place], [place] [address]   Comprehensive metabolic panel     Status: Abnormal   Collection Time: 04/19/18  4:37 PM  Result Value Ref Range   Sodium 138 135 - 145 mmol/L   Potassium 3.3 (L) 3.5 - 5.1 mmol/L   Chloride 103 98 - 111 mmol/L   CO2 26 22 - 32 mmol/L   Glucose, Bld 127 (H) 70 - 99 mg/dL   BUN 17 8 - 23 mg/dL   Creatinine, Ser 1.15 0.61 - 1.24 mg/dL   Calcium 8.6 (L) 8.9 - 10.3 mg/dL   Total Protein 6.1 (L) 6.5 - 8.1 g/dL   Albumin 3.1 (L) 3.5 - 5.0 g/dL   AST 34 15 - 41 U/L   ALT 42 0 - 44 U/L   Alkaline  Phosphatase 93 38 - 126 U/L   Total Bilirubin 1.0 0.3 - 1.2 mg/dL   GFR calc non Af Amer 59 (L) >60 mL/min   GFR calc Af Amer >60 >60 mL/min   Anion gap 9 5 - 15  Comment: Performed at [facility], [place] [address], [place], [place] [address]  Brain natriuretic peptide     Status: Abnormal   Collection Time: 04/19/18  4:37 PM  Result Value Ref Range   B Natriuretic Peptide 237.2 (H) 0.0 - 100.0 pg/mL    Comment: Performed at [facility], [place] [address], [place], [place] [address]  CBC with Differential     Status: Abnormal   Collection Time: 04/19/18  4:37 PM  Result Value Ref Range   WBC 18.1 (H) 4.0 - 10.5 K/uL   RBC 2.24 (L) 4.22 - 5.81 MIL/uL   Hemoglobin 6.6 (LL) 13.0 - 17.0 g/dL    Comment: REPEATED TO VERIFY THIS CRITICAL RESULT HAS VERIFIED AND BEEN CALLED TO Severus Brodzinski,S. RN BY KATHLEEN COHEN ON 03 12 2020 AT 1062, AND HAS BEEN READ BACK. CRITICAL RESULT VERIFIED    HCT 21.5 (L) 39.0 - 52.0 %   MCV 96.0 80.0 - 100.0 fL   MCH 29.5 26.0 - 34.0 pg   MCHC 30.7 30.0 - 36.0 g/dL   RDW 17.2 (H) 11.5 - 15.5 %   Platelets 157 150 - 400 K/uL   nRBC 0.2 0.0 - 0.2 %   Neutrophils Relative % 79 %   Neutro Abs 14.3 (H) 1.7 - 7.7 K/uL   Lymphocytes Relative 8 %   Lymphs Abs 1.4 0.7 - 4.0 K/uL   Monocytes Relative 10 %   Monocytes Absolute 1.7 (H) 0.1 - 1.0 K/uL   Eosinophils Relative 0 %   Eosinophils Absolute 0.0 0.0 - 0.5 K/uL   Basophils Relative 0 %   Basophils Absolute 0.0 0.0 - 0.1 K/uL   Immature Granulocytes 3 %   Abs Immature Granulocytes 0.57 (H) 0.00 - 0.07 K/uL    Comment: Performed at [facility], [place] [address], [place], [place] [address]  Protime-INR     Status: Abnormal   Collection Time: 04/19/18  4:37 PM  Result Value Ref Range   Prothrombin Time 16.1 (H) 11.4 - 15.2 seconds   INR 1.3 (H) 0.8 - 1.2    Comment: (NOTE) INR goal varies based on device and disease states. Performed at [facility], [place] [address], [place], [place] [address]    Dg Chest Port 1 View  Result Date: 04/19/2018 CLINICAL DATA:  Low-grade fever for the last 3 days. Shortness of breath. EXAM: PORTABLE CHEST 1 VIEW COMPARISON:  04/17/2018.  03/14/2018 FINDINGS: Right arm PICC tip in the SVC above the right atrium. The right lung remains clear. There is worsening pneumonia within the left lower lobe. Left upper lobe remains largely clear. IMPRESSION: Worsening of left lower lobe pneumonia. Electronically Signed   By: Nelson Chimes M.D.   On: 04/19/2018 16:46    Pending Labs Unresulted Labs (From admission, onward)    Start     Ordered   04/19/18 1919  Magnesium  Add-on,   R     04/19/18 1918   04/19/18 1918  Hemoglobin and hematocrit, blood  Once,   R    Comments:  Post transfusion    04/19/18 1918   04/19/18 1702  Prepare RBC  (Adult Blood Administration - Red Blood Cells)  Once,   R    Question Answer Comment  # of Units 1 unit   Transfusion Indications Symptomatic Anemia   Number of Units to Keep Ahead 2 units ahead   If emergent release call blood bank Not emergent release  Instructions: Transfuse      04/19/18 1701   04/19/18 1503  Urinalysis, Routine w reflex microscopic  ONCE - STAT,   STAT     04/19/18 1503   04/19/18 1503  Culture, blood (routine x 2)  BLOOD CULTURE X 2,   STAT     04/19/18 1503   04/19/18 1503  Type and screen [place]  Once,   STAT    Comments:  [place]    04/19/18 1503   Signed and Held  Culture, sputum-assessment  Once,   R     Signed and Held   Signed and Held  Gram stain  Once,   R     Signed and Held   Signed and Held  HIV antibody (Routine Screening)  Once,   R     Signed and Held   Signed and Held  Strep pneumoniae urinary antigen  Once,   R     Signed and Held   Visual merchandiser and Occupational hygienist morning,   R     Signed and Held   Signed and Held  CBC WITH DIFFERENTIAL  Tomorrow morning,    R     Signed and Held   Signed and Held  Legionella Pneumophila Serogp 1 Ur Ag  Once,   R     Signed and Held   Signed and Held  Urinalysis, Routine w reflex microscopic  Once,   R     Signed and Held   Signed and Held  Influenza panel by PCR (type A & B)  (Influenza PCR Panel)  Once,   R     Signed and Held   Signed and Held  Respiratory Panel by PCR  (Respiratory virus panel with precautions)  Once,   R     Signed and Held          Vitals/Pain Today's Vitals   04/19/18 1807 04/19/18 1830 04/19/18 1900 04/19/18 1904  BP: (!) 108/54 99/77 (!) 110/51 (!) 102/56  Pulse: 89  90 90  Resp: (!) 30 (!) 25 (!) 31 (!) 23  Temp: 99 F (37.2 C)     TempSrc: Oral     SpO2: 92%  94% 96%  PainSc:        Isolation Precautions Droplet precaution  Medications Medications  vancomycin (VANCOCIN) 1,750 mg in sodium chloride 0.9 % 500 mL IVPB (1,750 mg Intravenous New Bag/Given 04/19/18 1732)  methylPREDNISolone sodium succinate (SOLU-MEDROL) 125 mg/2 mL injection 60 mg (has no administration in time range)  potassium chloride SA (K-DUR,KLOR-CON) CR tablet 40 mEq (has no administration in time range)  HYDROcodone-acetaminophen (NORCO/VICODIN) 5-325 MG per tablet 1 tablet (1 tablet Oral Given 04/19/18 1559)  ceFEPIme (MAXIPIME) 2 g in sodium chloride 0.9 % 100 mL IVPB (0 g Intravenous Stopped 04/19/18 1708)  0.9 %  sodium chloride infusion (Manually program via Guardrails IV Fluids) ( Intravenous New Bag/Given 04/19/18 1811)    Mobility walks with device High fall risk   Focused Assessments See chart   R Recommendations: See Admitting Provider Note  Report given to:   Additional Notes: none  '

## 2018-04-19 NOTE — ED Notes (Signed)
Bed: WP79 Expected date:  Expected time:  Means of arrival:  Comments: Cancer center

## 2018-04-19 NOTE — ED Triage Notes (Signed)
Patient has had a low grade fever since Monday and last night his temperature was 101. He presents today from home with shortness of breath and pursed lip breathing. Monday he had a chest xray done which showed a tumor in the lower lobe, his hemoglobin was a 7.4. Today his hemoglobin is 7. He is chronically on 2 L of O2 but last night his oxygen had to increased due to saturations dropping in the 80's.    Cancer center vitals: 92/54 BP 24 Resp rate 92% O2 sats on 2L

## 2018-04-19 NOTE — Progress Notes (Signed)
[facility] Progress Note   Joseph Berg [id number] 1933/04/30 85 y.o.  Joseph Berg is managed by Dr. Dominica Severin B. Berg  Actively treated with chemotherapy/immunotherapy/hormonal therapy: yes  Current therapy: Carboplatin and etoposide with G-CSF support  Last treated: Cycle 2 etoposide/carboplatin, dose escalation of etoposide and carboplatin 04/03/2018  Next scheduled appointment with provider: 04/30/2018  Assessment: Plan:    Symptomatic anemia - Plan: Culture, Blood, Practitioner attestation of consent, Complete patient signature process for consent form, Type and screen, Care order/instruction, Prepare RBC, Transfuse RBC, 0.9 %  sodium chloride infusion (Manually program via Guardrails IV Fluids), furosemide (LASIX) injection 20 mg, acetaminophen (TYLENOL) tablet 650 mg, Prepare RBC  Fever, unspecified fever cause - Plan: Culture, Blood, Culture, Blood  Hypotension, unspecified hypotension type - Plan: Culture, Blood  Chronic systolic congestive heart failure (HCC)  Mixed restrictive and obstructive lung disease ([place])  Small cell carcinoma (HCC)   Symptomatic anemia: A CBC was completed today and returned with a hemoglobin of 7.0.  The patient has had a type and screen completed and has had 2 units of packed red blood cells ordered.   Fever, hypotension, COPD, and congestive heart failure: The patient had a fever of 101 last evening but is currently afebrile.  His blood pressure is at 90/51.  He is having pursed lip breathing lower extremity edema.  His exam shows decreased breath sounds in the left lower lobe.  The patient is awaiting transfer to the emergency room for further evaluation and management.  Blood cultures x2 have been collected.  A chest x-ray will be done once the patient is in the emergency room.   Small cell lung cancer: The patient is status post cycle 2 of carboplatin and etoposide with G-CSF support.  He is scheduled to see  Dr. Benay Berg in follow-up on 04/30/2018.  Dr. Benay Berg will follow up with the patient during this admission.  Please see After Visit Summary for patient specific instructions.  Future Appointments  Date Time Provider [place]  04/23/2018 10:30 AM [facility] ROOM [facility] None  04/23/2018 12:00 PM [facility] 1 [facility] [place]  04/30/2018  9:15 AM [facility] LAB 2 [facility] None  04/30/2018  9:30 AM [facility] [place] FLUSH [facility] None  04/30/2018 10:00 AM Ladell Pier, MD [facility] None  04/30/2018 11:30 AM [facility] INFUSION [facility] None  05/01/2018  1:30 PM [facility] INFUSION [facility] None  05/02/2018  1:30 PM [facility] INFUSION [facility] None  05/05/2018 12:30 PM [facility] [place] FLUSH [facility] None  05/28/2018  3:00 PM [name], MD [facility] None  07/25/2018  2:40 PM Crenshaw, Denice Bors, MD [facility] None    Orders Placed This Encounter  Procedures  . Culture, Blood  . Culture, Blood  . Type and screen  . Prepare RBC       Subjective:   Patient ID:  Joseph Berg is a 85 y.o. (DOB 09/14/1933) male.  Chief Complaint:  Chief Complaint  Patient presents with  . Shortness of Breath    HPI Joseph Berg is an 85 year old male with a history of an extensive stage small cell lung cancer who is managed by Dr. Dominica Severin B. Berg.  His disease is complicated by bone marrow involvement of metastatic small cell carcinoma which has caused him to have thrombocytopenia and anemia.  He is status post cycle 2 of carboplatin and etoposide with G-CSF which was dosed on 04/03/2018.  Joseph Berg has multiple comorbidities including COPD, coronary artery disease, congestive heart failure, renal  insufficiency, coagulopathy, anemia, leukopenia, and thrombocytopenia secondary to his small cell lung cancer and chemotherapy.  He was last seen on 04/17/2018 for nausea, diarrhea, fatigue, and a fever of 100.5-101 on the evening before.  He was referred for a chest x-ray  which showed:  Stable left lower lobe pulmonary mass versus airspace consolidation.  He is seen today for increasing shortness of breath with pursed lip breathing.  He is having difficulty lying down due to his shortness of breath.  His daughter appears with him today.  She reports that he had a fever of 101 last evening and a fever of 100 this morning.  His baseline oxygen level has been in the low 90s on 2 L via nasal cannula.  Last evening he desatted and to the mid 80s on several occasions and had to have his oxygen increased to 3 - 4 liters per minute in order to get his oxygen saturation back to his baseline.  Today he reports ongoing lower back pain but has been noted to have hypotension with a blood pressure at 90/51, respirations of 24, oxygen saturation at 92% on 2 L via nasal cannula, and a temperature of 99.  A CBC was completed which returned showing a hemoglobin of 7.  Blood cultures x2 have been ordered.  A type and cross has been completed with 2 units of packed red blood cells ordered.  Joseph Berg is being transferred to the emergency room for evaluation and management.  Medications: I have reviewed the patient's current medications.  Allergies:  Allergies  Allergen Reactions  . Hydrochlorothiazide Other (See Comments)    Affected renal function.  "affected kidneys and calcium level"    Past Medical History:  Diagnosis Date  . BPH (benign prostatic hyperplasia)   . CAD (coronary artery disease)    a. s/p CABG x1 with LIMA-LAD 1994. b. LM & 3v CAD by cath 07/2012  . CHF (congestive heart failure) ([place])    a. EF 35-40% by echo 07/2012.  [name] Complication of anesthesia    affected patients memory. Pt stated "I couldn't remember anything for three months...just bits and pieces"  . Emphysema    a. Moderate emphysema by CT 06/2012.  [name] Heart attack ([place]) 1994  . Hyperlipidemia   . Hypertension   . Hypoparathyroidism ([place])   . NSVT (nonsustained ventricular tachycardia) ([place])     a. Seen on tele 07/2012.  . OSA on CPAP    pt stated he does not wear CPAP because he no longer has sleep apnea  . Pneumonia   . Pseudoaneurysm of left femoral artery ([place])    a. post cath s/p compression 07/2012, associated w/ anemia.  . Sciatica 2012   Qualifier: Diagnosis of  By: Madilyn Fireman MD, Barnetta Chapel      Past Surgical History:  Procedure Laterality Date  . CATARACT EXTRACTION, BILATERAL    . COLONOSCOPY    . CORONARY ARTERY BYPASS GRAFT  03-23-92  . CORONARY ARTERY BYPASS GRAFT N/A 08/14/2012   Procedure: REDO CORONARY ARTERY BYPASS GRAFTING (CABG);  Surgeon: Gaye Pollack, MD;  Location: [place];  Service: Open Heart Surgery;  Laterality: N/A;  . INTRAOPERATIVE TRANSESOPHAGEAL ECHOCARDIOGRAM N/A 08/14/2012   Procedure: INTRAOPERATIVE TRANSESOPHAGEAL ECHOCARDIOGRAM;  Surgeon: Gaye Pollack, MD;  Location: [facility];  Service: Open Heart Surgery;  Laterality: N/A;  . LUMBAR LAMINECTOMY/DECOMPRESSION MICRODISCECTOMY Left 02/18/2015   Procedure: LUMBAR LAMINECTOMY/DECOMPRESSION MICRODISCECTOMY- LEFT FOUR-FIVE ;  Surgeon: Ashok Pall, MD;  Location: [facility];  Service: Neurosurgery;  Laterality: Left;    Family History  Problem Relation Age of Onset  . Heart attack Father 5  . Stroke Brother 62  . Hypertension Brother   . Hyperlipidemia Brother     Social History   Socioeconomic History  . Marital status: Married    Spouse name: Not on file  . Number of children: Not on file  . Years of education: Not on file  . Highest education level: Not on file  Occupational History  . Not on file  Social Needs  . Financial resource strain: Not on file  . Food insecurity:    Worry: Not on file    Inability: Not on file  . Transportation needs:    Medical: Not on file    Non-medical: Not on file  Tobacco Use  . Smoking status: Current Some Day Smoker    Packs/day: 1.00    Years: 60.00    Pack years: 60.00    Types: Cigarettes, Pipe  . Smokeless tobacco: Never Used  . Tobacco  comment: 01/11/2018 "smokes cigars only now"  Substance and Sexual Activity  . Alcohol use: Yes    Comment: Occasional  . Drug use: No  . Sexual activity: Not on file    Comment: works part-time, [employer] completed H, married, no children, regular exercise.S  Lifestyle  . Physical activity:    Days per week: Not on file    Minutes per session: Not on file  . Stress: Not on file  Relationships  . Social connections:    Talks on phone: Not on file    Gets together: Not on file    Attends religious service: Not on file    Active member of club or organization: Not on file    Attends meetings of clubs or organizations: Not on file    Relationship status: Not on file  . Intimate partner violence:    Fear of current or ex partner: Not on file    Emotionally abused: Not on file    Physically abused: Not on file    Forced sexual activity: Not on file  Other Topics Concern  . Not on file  Social History Narrative  . Not on file    Past Medical History, Surgical history, Social history, and Family history were reviewed and updated as appropriate.   Please see review of systems for further details on the patient's review from today.   Review of Systems:  Review of Systems  Constitutional: Positive for chills, fatigue and fever. Negative for diaphoresis.  HENT: Negative for congestion, postnasal drip, rhinorrhea and sore throat.   Respiratory: Positive for shortness of breath. Negative for cough and wheezing.   Cardiovascular: Negative for palpitations.  Gastrointestinal: Negative for constipation, diarrhea, nausea and vomiting.  Neurological: Positive for weakness. Negative for headaches.    Objective:   Physical Exam:  BP (!) 92/54 Comment: manual, PA Lucianne Lei aware  Pulse 97 Comment: irregular, PA  aware  Temp 99 F (37.2 C) (Oral)   Resp (!) 24   Wt 202 lb 9 oz (91.9 kg)   SpO2 92% Comment: 2L [place]  BMI 29.91 kg/m  ECOG: 2  Physical Exam Constitutional:       General: He is not in acute distress.    Appearance: He is not ill-appearing.     Comments: The patient is a chronically ill-appearing elderly male who is receiving oxygen via nasal cannula and is breathing with pursed lips.  HENT:     Head:  Normocephalic and atraumatic.  Cardiovascular:     Rate and Rhythm: Rhythm regularly irregular.     Heart sounds: S1 normal and S2 normal.  Pulmonary:     Breath sounds: Examination of the left-middle field reveals decreased breath sounds. Examination of the left-lower field reveals decreased breath sounds. Decreased breath sounds present.  Chest:     Chest wall: Edema present.  Musculoskeletal:     Right lower leg: 1+ Pitting Edema present.     Left lower leg: 1+ Pitting Edema present.  Skin:    General: Skin is warm.     Coloration: Skin is pale.     Lab Review:     Component Value Date/Time   NA 139 04/17/2018 1456   K 4.0 04/17/2018 1456   CL 100 04/17/2018 1456   CO2 28 04/17/2018 1456   GLUCOSE 115 (H) 04/17/2018 1456   BUN 13 04/17/2018 1456   CREATININE 1.15 04/17/2018 1456   CREATININE 0.91 02/14/2018 1011   CALCIUM 9.6 04/17/2018 1456   CALCIUM 10.8 (H) 07/22/2010 0907   PROT 5.8 (L) 04/03/2018 0929   ALBUMIN 3.1 (L) 04/03/2018 0929   AST 21 04/03/2018 0929   ALT 14 04/03/2018 0929   ALKPHOS 159 (H) 04/03/2018 0929   BILITOT 1.4 (H) 04/03/2018 0929   GFRNONAA 59 (L) 04/17/2018 1456   GFRNONAA 78 02/14/2018 1011   GFRAA >60 04/17/2018 1456   GFRAA 91 02/14/2018 1011       Component Value Date/Time   WBC 16.9 (H) 04/19/2018 1208   WBC 7.1 03/21/2018 0510   RBC 2.32 (L) 04/19/2018 1208   HGB 7.0 (L) 04/19/2018 1208   HCT 21.9 (L) 04/19/2018 1208   PLT 143 (L) 04/19/2018 1208   MCV 94.4 04/19/2018 1208   MCH 30.2 04/19/2018 1208   MCHC 32.0 04/19/2018 1208   RDW 17.2 (H) 04/19/2018 1208   LYMPHSABS 1.1 04/19/2018 1208   MONOABS 1.7 (H) 04/19/2018 1208   EOSABS 0.0 04/19/2018 1208   BASOSABS 0.0 04/19/2018 1208    -------------------------------  Imaging from last 24 hours (if applicable):  Radiology interpretation: Dg Chest 2 View  Result Date: 04/17/2018 CLINICAL DATA:  Diarrhea and fever. Lung cancer. EXAM: CHEST - 2 VIEW COMPARISON:  03/14/2018 FINDINGS: Right PICC line in stable position. Postsurgical changes from CABG. Cardiomediastinal silhouette is normal. Mediastinal contours appear intact. Calcific atherosclerotic disease and tortuosity of the aorta. There is no evidence of pleural effusion or pneumothorax. Left lower lobe pulmonary mass versus airspace consolidation, stable. Osseous structures are without acute abnormality. Soft tissues are grossly normal. IMPRESSION: Stable left lower lobe pulmonary mass versus airspace consolidation. Electronically Signed   By: Fidela Salisbury M.D.   On: 04/17/2018 14:44   Ct Head W & Wo Contrast  Result Date: 03/21/2018 CLINICAL DATA:  Staging lung EXAM: CT HEAD WITHOUT AND WITH CONTRAST TECHNIQUE: Contiguous axial images were obtained from the base of the skull through the vertex without and with intravenous contrast CONTRAST:  54mL OMNIPAQUE IOHEXOL 300 MG/ML  SOLN COMPARISON:  None. FINDINGS: Brain: Remote appearing infarct in the parasagittal right parietal and occipital lobes, overall moderate volume. Small-vessel ischemic change in the cerebral white matter. Left para median falcine calcification without detected superimposed enhancement. This could be a calcified meningioma or nodular dural calcification. No evidence of metastatic disease. Vascular: Atherosclerotic calcification. Skull: No evident bony metastasis. Sinuses/Orbits: Negative IMPRESSION: 1. No evidence of metastatic disease. 2. Remote right occipital parietal infarct. Electronically Signed   By: Angelica Chessman  Watts M.D.   On: 03/21/2018 12:01        This patient was seen with Dr. Benay Berg with my treatment plan reviewed with him. He expressed agreement with my medical management of this  patient. This was a shared visit with Sandi Mealy.  Mr. Brumett was interviewed and examined.  He is now a day 17 following cycle 2 etoposide/carboplatin for treatment of extensive stage small cell lung cancer.  He received G-CSF on 04/07/2018. He presents today with a fever and dyspnea.  The lung exam is abnormal.  We suspect he has pneumonia.  We contacted the hospitalist service to request hospital admission.  He will be referred to the emergency room while waiting on evaluation by the hospitalist team.  We plan to consider adding immunotherapy beginning with cycle 3 of systemic therapy.  I will discuss this with Mr. Troiani and his wife while he is in the hospital.  Julieanne Manson, MD

## 2018-04-19 NOTE — Progress Notes (Signed)
A consult was received from an ED physician for vanc/cefepime per pharmacy dosing.  The patient's profile has been reviewed for ht/wt/allergies/indication/available labs.   A one time order has been placed for vanc 1750mg  and cefepime 2g.  Further antibiotics/pharmacy consults should be ordered by admitting physician if indicated.                       Thank you, [place], [place] 04/19/2018  3:26 PM

## 2018-04-19 NOTE — Progress Notes (Signed)
These preliminary result these preliminary results were noted.  Awaiting final report.

## 2018-04-19 NOTE — H&P (Signed)
History and Physical    Joseph Berg:[id number] DOB: 07/28/1933 DOA: 04/19/2018  PCP: [name], MD    I have personally briefly reviewed patient's old medical records in [place]  Chief Complaint: came from oncology office.  Complaining of cough, sob, fever and generalized weakness since few days.   HPI: Joseph Berg is a 85 y.o. male with medical history significant of metastatic small cell lung cancer, hypertension, COPD, chronic systolic heart failure completed 2 cycles of carboplatin and etoposide came in to oncology office for follow up , was found to be sob, febrile, hypotensive and was sent to ED for further evaluation and admission.  Pt denies any chest pain, . He reports nausea, and diarrhea until three days ago. No vomiting. No headache or dizziness. Pt reports at baseline he does not need oxygen. On arrival to ED he was hypoxic requiring up to 3 lit of [place] Oxygen. No hemoptysis or hematemesis or hematochezia. No melena.    ED Course: on arrival he had low grade temp, hypoxic, tachypnea, hypotensive and tachycardic. Labs revealed leukocytosis, anemic with a hemoglobin of 6.6, BNP of 237, hypokalemic and hypomagnesemia of 1.5. influenza PCR is negative.  Blood cultures done and pending. CXR showed worsening left lower lobe pneumonia.   He was referred to [facility] for admission.   Review of Systems: As per HPI otherwise 10 point review of systems negative.    Past Medical History:  Diagnosis Date  . BPH (benign prostatic hyperplasia)   . CAD (coronary artery disease)    a. s/p CABG x1 with LIMA-LAD 1994. b. LM & 3v CAD by cath 07/2012  . CHF (congestive heart failure) ([place])    a. EF 35-40% by echo 07/2012.  [name] Complication of anesthesia    affected patients memory. Pt stated "I couldn't remember anything for three months...just bits and pieces"  . Emphysema    a. Moderate emphysema by CT 06/2012.  [name] Heart attack ([place]) 1994  . Hyperlipidemia    . Hypertension   . Hypoparathyroidism ([place])   . NSVT (nonsustained ventricular tachycardia) ([place])    a. Seen on tele 07/2012.  . OSA on CPAP    pt stated he does not wear CPAP because he no longer has sleep apnea  . Pneumonia   . Pseudoaneurysm of left femoral artery ([place])    a. post cath s/p compression 07/2012, associated w/ anemia.  . Sciatica 2012   Qualifier: Diagnosis of  By: Madilyn Fireman MD, Barnetta Chapel      Past Surgical History:  Procedure Laterality Date  . CATARACT EXTRACTION, BILATERAL    . COLONOSCOPY    . CORONARY ARTERY BYPASS GRAFT  03-23-92  . CORONARY ARTERY BYPASS GRAFT N/A 08/14/2012   Procedure: REDO CORONARY ARTERY BYPASS GRAFTING (CABG);  Surgeon: Gaye Pollack, MD;  Location: [facility];  Service: Open Heart Surgery;  Laterality: N/A;  . INTRAOPERATIVE TRANSESOPHAGEAL ECHOCARDIOGRAM N/A 08/14/2012   Procedure: INTRAOPERATIVE TRANSESOPHAGEAL ECHOCARDIOGRAM;  Surgeon: Gaye Pollack, MD;  Location: [facility];  Service: Open Heart Surgery;  Laterality: N/A;  . LUMBAR LAMINECTOMY/DECOMPRESSION MICRODISCECTOMY Left 02/18/2015   Procedure: LUMBAR LAMINECTOMY/DECOMPRESSION MICRODISCECTOMY- LEFT FOUR-FIVE ;  Surgeon: Ashok Pall, MD;  Location: [facility];  Service: Neurosurgery;  Laterality: Left;     reports that he has been smoking cigarettes and pipe. He has a 60.00 pack-year smoking history. He has never used smokeless tobacco. He reports current alcohol use. He reports that he does not use drugs.  Allergies  Allergen Reactions  . Hydrochlorothiazide Other (See Comments)    Affected renal function.  "affected kidneys and calcium level"    Family History  Problem Relation Age of Onset  . Heart attack Father 52  . Stroke Brother 81  . Hypertension Brother   . Hyperlipidemia Brother     Family history reviewed and not pertinent  Prior to Admission medications   Medication Sig Start Date End Date Taking? Authorizing Provider  calcium-vitamin D (OSCAL 500/200 D-3) 500-200  MG-UNIT tablet Take 1 tablet by mouth 2 (two) times daily. Patient taking differently: Take 1 tablet by mouth daily with breakfast.  03/21/18 03/21/19 Yes Georgette Shell, MD  carvedilol (COREG) 12.5 MG tablet Take 1 tablet (12.5 mg total) by mouth 2 (two) times daily with a meal. 07/05/17  Yes Crenshaw, Denice Bors, MD  cholecalciferol (VITAMIN D3) 25 MCG (1000 UT) tablet Take 1,000 Units by mouth daily.   Yes [provider]  cilostazol (PLETAL) 100 MG tablet TAKE 1 TABLET BY MOUTH TWICE A DAY 04/11/18  Yes Silverio Decamp, MD  Cyanocobalamin (VITAMIN B-12 PO) Take 1 tablet by mouth daily.   Yes [provider]  finasteride (PROSCAR) 5 MG tablet Take 1 tablet (5 mg total) by mouth every evening. 04/16/18  Yes [name], MD  Fluticasone-Umeclidin-Vilant (TRELEGY ELLIPTA) 100-62.5-25 MCG/INH AEPB Inhale 1 Inhaler into the lungs daily. 01/09/18  Yes [name], MD  furosemide (LASIX) 40 MG tablet Take 1 tablet (40 mg total) by mouth daily. 07/05/17  Yes Lelon Perla, MD  ipratropium-albuterol (DUONEB) 0.5-2.5 (3) MG/3ML SOLN Take 3 mLs by nebulization every 2 (two) hours as needed (sob).    Yes [provider]  loperamide (IMODIUM A-D) 2 MG tablet Take 2 mg by mouth 4 (four) times daily as needed for diarrhea or loose stools.   Yes [provider]  Multiple Vitamin (MULTIVITAMIN) capsule Take 1 capsule by mouth daily.   Yes [provider]  potassium chloride SA (K-DUR,KLOR-CON) 20 MEQ tablet Take 1 tablet (20 mEq total) by mouth daily. 04/03/18  Yes Ladell Pier, MD  Probiotic Product (TRUBIOTICS PO) Take 1 tablet by mouth daily.   Yes [provider]  prochlorperazine (COMPAZINE) 5 MG tablet Take 1 tablet (5 mg total) by mouth every 6 (six) hours as needed for nausea or vomiting. 04/03/18  Yes Ladell Pier, MD  rosuvastatin (CRESTOR) 40 MG tablet Take 1 tablet (40 mg total) by mouth daily. 11/27/17  Yes Lelon Perla, MD  vitamin C (ASCORBIC ACID) 250 MG tablet Take 750 mg by mouth daily.   Yes [provider]  AMBULATORY NON FORMULARY MEDICATION Nebulizer - use as needed - with supplies.  Diagnosis of COPD J44.1 09/19/17   [name], MD  AMBULATORY NON FORMULARY MEDICATION Medication Name: portable O2 concentrator, (Inogen). Dx:J44.1.  He is oxygen dependent and having to go to chemotherapy appointments multiple times a week. Fax to [facility] 04/13/18   [name], MD  CARBOPLATIN IV Inject into the vein.    [provider]  ETOPOSIDE IV Inject into the vein.    [provider]  Nebulizer MISC Generic nebulizer plus supplies 03/26/15   Emeterio Reeve, DO  senna-docusate (SENOKOT-S) 8.6-50 MG tablet Take 1 tablet by mouth at bedtime as needed for mild constipation. 01/18/18   Domenic Polite, MD  VENTOLIN HFA 108 (90 Base) MCG/ACT inhaler TAKE 2 PUFFS BY MOUTH EVERY 6  HOURS AS NEEDED FOR WHEEZE OR SHORTNESS OF BREATH Patient taking differently: Inhale 2 puffs into the lungs every 6 (six) hours as needed for wheezing.  12/28/17   [name], MD    Physical Exam: Vitals:   04/19/18 1807 04/19/18 1830 04/19/18 1900 04/19/18 1904  BP: (!) 108/54 99/77 (!) 110/51 (!) 102/56  Pulse: 89  90 90  Resp: (!) 30 (!) 25 (!) 31 (!) 23  Temp: 99 F (37.2 C)     TempSrc: Oral     SpO2: 92%  94% 96%    Constitutional: NAD, calm, comfortable Vitals:   04/19/18 1807 04/19/18 1830 04/19/18 1900 04/19/18 1904  BP: (!) 108/54 99/77 (!) 110/51 (!) 102/56  Pulse: 89  90 90  Resp: (!) 30 (!) 25 (!) 31 (!) 23  Temp: 99 F (37.2 C)     TempSrc: Oral     SpO2: 92%  94% 96%   Eyes: PERRL, lids and conjunctivae normal ENMT: Mucous membranes are moist.  Neck: normal, supple, no masses, no thyromegaly Respiratory: left sided rhonchi, diminished at bases, bilateral wheezing heard.  Cardiovascular: Regular rate and rhythm, no murmurs Abdomen: no  tenderness, no masses palpated. No hepatosplenomegaly. Bowel sounds positive.  Musculoskeletal: no clubbing / cyanosis. No joint deformity upper and lower extremities. .  Skin: no rashes, lesions, ulcers. No induration Neurologic: CN 2-12 grossly intact. Sensation intact, DTR normal.  Psychiatric:Alert and oriented x 3. Normal mood.     Labs on Admission: I have personally reviewed following labs and imaging studies  CBC: Recent Labs  Lab 04/17/18 1456 04/19/18 1208 04/19/18 1637  WBC 10.7* 16.9* 18.1*  NEUTROABS 7.3 13.4* 14.3*  HGB 7.4* 7.0* 6.6*  HCT 23.6* 21.9* 21.5*  MCV 94.4 94.4 96.0  PLT 66* 143* 379   Basic Metabolic Panel: Recent Labs  Lab 04/17/18 1456 04/19/18 1637  NA 139 138  K 4.0 3.3*  CL 100 103  CO2 28 26  GLUCOSE 115* 127*  BUN 13 17  CREATININE 1.15 1.15  CALCIUM 9.6 8.6*  MG 1.5*  --    GFR: Estimated Creatinine Clearance: 55.5 mL/min (by C-G formula based on SCr of 1.15 mg/dL). Liver Function Tests: Recent Labs  Lab 04/19/18 1637  AST 34  ALT 42  ALKPHOS 93  BILITOT 1.0  PROT 6.1*  ALBUMIN 3.1*   No results for input(s): LIPASE, AMYLASE in the last 168 hours. No results for input(s): AMMONIA in the last 168 hours. Coagulation Profile: Recent Labs  Lab 04/19/18 1637  INR 1.3*   Cardiac Enzymes: No results for input(s): CKTOTAL, CKMB, CKMBINDEX, TROPONINI in the last 168 hours. BNP (last 3 results) No results for input(s): PROBNP in the last 8760 hours. HbA1C: No results for input(s): HGBA1C in the last 72 hours. CBG: No results for input(s): GLUCAP in the last 168 hours. Lipid Profile: No results for input(s): CHOL, HDL, LDLCALC, TRIG, CHOLHDL, LDLDIRECT in the last 72 hours. Thyroid Function Tests: No results for input(s): TSH, T4TOTAL, FREET4, T3FREE, THYROIDAB in the last 72 hours. Anemia Panel: No results for input(s): VITAMINB12, FOLATE, FERRITIN, TIBC, IRON, RETICCTPCT in the last 72 hours. Urine analysis:     Component Value Date/Time   COLORURINE YELLOW 03/05/2018 2038   APPEARANCEUR CLEAR 03/05/2018 2038   LABSPEC 1.023 03/05/2018 2038   PHURINE 6.0 03/05/2018 2038   GLUCOSEU NEGATIVE 03/05/2018 2038   HGBUR MODERATE (A) 03/05/2018 2038   BILIRUBINUR NEGATIVE 03/05/2018 2038   BILIRUBINUR neg 10/31/2011 1121  [place] NEGATIVE 03/05/2018 2038   PROTEINUR NEGATIVE 03/05/2018 2038   UROBILINOGEN 2.0 (H) 08/09/2012 1316   NITRITE NEGATIVE 03/05/2018 2038   LEUKOCYTESUR NEGATIVE 03/05/2018 2038    Radiological Exams on Admission: Dg Chest Port 1 View  Result Date: 04/19/2018 CLINICAL DATA:  Low-grade fever for the last 3 days. Shortness of breath. EXAM: PORTABLE CHEST 1 VIEW COMPARISON:  04/17/2018.  03/14/2018 FINDINGS: Right arm PICC tip in the SVC above the right atrium. The right lung remains clear. There is worsening pneumonia within the left lower lobe. Left upper lobe remains largely clear. IMPRESSION: Worsening of left lower lobe pneumonia. Electronically Signed   By: Nelson Chimes M.D.   On: 04/19/2018 16:46    EKG: Independently reviewed. RBBB  Assessment/Plan Active Problems:   HCAP (healthcare-associated pneumonia)   Acute on chronic respiratory failure with hypoxia secondary to left lower lobe pneumonia.  We will treat as health care associated pneumonia, with broad spectrum IV antibiotics.  Blood cultures ordered, urine for strep pneumonia antigen and legionella antigen ordered.  Influenza PCR is negative. Respiratory panel is pending.  Stated the patient on IV solumedrol and continue with duo nebs.  [place] oxygen to keep sats greater than 90%.    COPD: Diffuse wheezing heard. Started him on IV solumedrol and duo nebs   Hypokalemia and hypomagnesemia replaced.   CAD s/p CABG: Pt denies any chest pain.    Hyperlipidemia: resume crestor.    Hypertension:  Well controlled.    Anemia of chronic disease: Possibly from chemo.  No hematochezia and hematemesis.   Anemia panel ordered.  2 units of prbc transfusion ordered.  Repeat H&H ordered post transfusion.  Keep hemoglobin greater than 7.      DVT prophylaxis: scd's Code Status:DNR.  Family Communication: WIFE at bedside.  Disposition Plan: pending clinical improvement.  Consults called: none.  Admission status: obs / tele.    Hosie Poisson MD [facility] Pager [phone]  If 7PM-7AM, please contact night-coverage [email or web address] Password [facility]  04/19/2018, 7:19 PM

## 2018-04-19 NOTE — Progress Notes (Signed)
Pharmacy Antibiotic Note  Joseph Berg is a 85 y.o. male admitted on 04/19/2018 with pneumonia.  Pharmacy has been consulted for vancomyicn dosing.  Plan: Cefepime 1 g iv q 8h per MD.  Vancomycin 1750 mg iv load then.   Vancomycin 1000 mg IV Q 24 hrs. Goal AUC 400-550. Expected AUC: 478 SCr used: 1.15  F/U renal function, culture results, and MRSA PCR      Temp (24hrs), Avg:98.8 F (37.1 C), Min:98.5 F (36.9 C), Max:99 F (37.2 C)  Recent Labs  Lab 04/17/18 1456 04/19/18 1208 04/19/18 1637  WBC 10.7* 16.9* 18.1*  CREATININE 1.15  --  1.15    Estimated Creatinine Clearance: 55.5 mL/min (by C-G formula based on SCr of 1.15 mg/dL).    Allergies  Allergen Reactions  . Hydrochlorothiazide Other (See Comments)    Affected renal function.  "affected kidneys and calcium level"    Antimicrobials this admission: 3/12 cefepime >>  3/12 vancomycin >>   Dose adjustments this admission:   Microbiology results: 3/12 BCx:  3/12 Sputum:   3/12 MRSA PCR:   Thank you for allowing pharmacy to be a part of this patient's care.  Napoleon Form 04/19/2018 7:27 PM

## 2018-04-19 NOTE — Progress Notes (Signed)
Pt had an order for 1 unit PRBC by hospitalist and Oncology ordered 2 units this morning. RN verified order with N.P. N.P. stated patient need to have 2 units. Will continue to monitor.

## 2018-04-19 NOTE — Telephone Encounter (Signed)
Called pt to inform him of [facility] not supplying the Inogen product and his wife answered the phone and told me that he is at [facility] because they think he may have PNE again. I told her to let us know if there is anything that we can do for them.  I advised her to go online to [employer] to see if he can get this thru them. She stated that she would look into this.Maryruth Eve, [place]ahoma Crocker, CMA

## 2018-04-19 NOTE — Patient Instructions (Signed)
PICC Home Care Guide ° °A peripherally inserted central catheter (PICC) is a form of IV access that allows medicines and IV fluids to be quickly distributed throughout the body. The PICC is a long, thin, flexible tube (catheter) that is inserted into a vein in the upper arm. The catheter ends in a large vein in the chest (superior vena cava, or SVC). After the PICC is inserted, a chest X-ray may be done to make sure that it is in the correct place. °A PICC may be placed for different reasons, such as: °· To give medicines and liquid nutrition. °· To give IV fluids and blood products. °· If there is trouble placing a peripheral intravenous (PIV) catheter. °If taken care of properly, a PICC can remain in place for several months. Having a PICC can also allow a person to go home from the hospital sooner. Medicine and PICC care can be managed at home by a family member, caregiver, or home health care team. °What are the risks? °Generally, having a PICC is safe. However, problems may occur, including: °· A blood clot (thrombus) forming in or at the tip of the PICC. °· A blood clot forming in a vein (deep vein thrombosis) or traveling to the lung (pulmonary embolism). °· Inflammation of the vein (phlebitis) in which the PICC is placed. °· Infection. Central line associated blood stream infection (CLABSI) is a serious infection that often requires hospitalization. °· PICC movement (malposition). The PICC tip may move from its original position due to excessive physical activity, forceful coughing, sneezing, or vomiting. °· A break or cut in the PICC. It is important not to use scissors near the PICC. °· Nerve or tendon irritation or injury during PICC insertion. °How to take care of your PICC °Preventing problems °· You and any caregivers should wash your hands often with soap. Wash hands: °? Before touching the PICC line or the infusion device. °? Before changing a bandage (dressing). °· Flush the PICC as told by your  health care provider. Let your health care provider know right away if the PICC is hard to flush or does not flush. Do not use force to flush the PICC. °· Do not use a syringe that is less than 10 mL to flush the PICC. °· Avoid blood pressure checks on the arm in which the PICC is placed. °· Never pull or tug on the PICC. °· Do not take the PICC out yourself. Only a trained clinical professional should remove the PICC. °· Use clean and sterile supplies only. Keep the supplies in a dry place. Do not reuse needles, syringes, or any other supplies. Doing that can lead to infection. °· Keep pets and children away from your PICC line. °· Check the PICC insertion site every day for signs of infection. Check for: °? Leakage. °? Redness, swelling, or pain. °? Fluid or blood. °? Warmth. °? Pus or a bad smell. °PICC dressing care °· Keep your PICC bandage (dressing) clean and dry to prevent infection. °· Do not take baths, swim, or use a hot tub until your health care provider approves. Ask your health care provider if you can take showers. You may only be allowed to take sponge baths for bathing. When you are allowed to shower: °? Ask your health care provider to teach you how to wrap the PICC line. °? Cover the PICC line with clear plastic wrap and tape to keep it dry while showering. °· Follow instructions from your health care provider   about how to take care of your insertion site and dressing. Make sure you: °? Wash your hands with soap and water before you change your bandage (dressing). If soap and water are not available, use hand sanitizer. °? Change your dressing as told by your health care provider. °? Leave stitches (sutures), skin glue, or adhesive strips in place. These skin closures may need to stay in place for 2 weeks or longer. If adhesive strip edges start to loosen and curl up, you may trim the loose edges. Do not remove adhesive strips completely unless your health care provider tells you to do  that. °· Change your PICC dressing if it becomes loose or wet. °General instructions ° °· Carry your PICC identification card or wear a medical alert bracelet at all times. °· Keep the tube clamped at all times, unless it is being used. °· Carry a smooth-edge clamp with you at all times to place on the tube if it breaks. °· Do not use scissors or sharp objects near the tube. °· You may bend your arm and move it freely. If your PICC is near or at the bend of your elbow, avoid activity with repeated motion at the elbow. °· Avoid lifting heavy objects as told by your health care provider. °· Keep all follow-up visits as told by your health care provider. This is important. °Disposal of supplies °· Throw away any syringes in a disposal container that is meant for sharp items (sharps container). You can buy a sharps container from a pharmacy, or you can make one by using an empty hard plastic bottle with a cover. °· Place any used dressings or infusion bags into a plastic bag. Throw that bag in the trash. °Contact a health care provider if: °· You have pain in your arm, ear, face, or teeth. °· You have a fever or chills. °· You have redness, swelling, or pain around the insertion site. °· You have fluid or blood coming from the insertion site. °· Your insertion site feels warm to the touch. °· You have pus or a bad smell coming from the insertion site. °· Your skin feels hard and raised around the insertion site. °Get help right away if: °· Your PICC is accidentally pulled all the way out. If this happens, cover the insertion site with a bandage or gauze dressing. Do not throw the PICC away. Your health care provider will need to check it. °· Your PICC was tugged or pulled and has partially come out. Do not  push the PICC back in. °· You cannot flush the PICC, it is hard to flush, or the PICC leaks around the insertion site when it is flushed. °· You hear a "flushing" sound when the PICC is flushed. °· You feel your  heart racing or skipping beats. °· There is a hole or tear in the PICC. °· You have swelling in the arm in which the PICC was inserted. °· You have a red streak going up your arm from where the PICC was inserted. °Summary °· A peripherally inserted central catheter (PICC) is a long, thin, flexible tube (catheter) that is inserted into a vein in the upper arm. °· The PICC is inserted using a sterile technique by a specially trained nurse or physician. Only a trained clinical professional should remove it. °· Keep your PICC identification card with you at all times. °· Avoid blood pressure checks on the arm in which the PICC is placed. °· If cared for   properly, a PICC can remain in place for several months. Having a PICC can also allow a person to go home from the hospital sooner. °This information is not intended to replace advice given to you by your health care provider. Make sure you discuss any questions you have with your health care provider. °Document Released: 07/31/2002 Document Revised: 02/27/2016 Document Reviewed: 02/27/2016 °Elsevier Interactive Patient Education © 2019 [employer] ° °

## 2018-04-19 NOTE — ED Provider Notes (Addendum)
[facility] DEPT Provider Note   CSN: [id number] Arrival date & time: 04/19/18  1428    History   Chief Complaint No chief complaint on file.   HPI Joseph Berg is a 85 y.o. male.     85 year old male with prior medical history as detailed below presents for evaluation of cough, shortness of breath, fever, and weakness.  Patient was seen by oncology earlier today and sent to the ED for admission for treatment of possible pneumonia.  He complains of feeling weaker and more fatigued over the last 3 to 4 days.  His family at bedside report a temperature up to 101 over the last 48 hours.  The history is provided by the patient, medical records and a relative.  Illness  Location:  Fever, cough, fatigue, weakness Severity:  Moderate Onset quality:  Gradual Duration:  4 days Timing:  Constant Progression:  Worsening Chronicity:  New Associated symptoms: cough, fatigue, fever and shortness of breath     Past Medical History:  Diagnosis Date  . BPH (benign prostatic hyperplasia)   . CAD (coronary artery disease)    a. s/p CABG x1 with LIMA-LAD 1994. b. LM & 3v CAD by cath 07/2012  . CHF (congestive heart failure) ([place])    a. EF 35-40% by echo 07/2012.  [name] Complication of anesthesia    affected patients memory. Pt stated "I couldn't remember anything for three months...just bits and pieces"  . Emphysema    a. Moderate emphysema by CT 06/2012.  [name] Heart attack ([place]) 1994  . Hyperlipidemia   . Hypertension   . Hypoparathyroidism ([place])   . NSVT (nonsustained ventricular tachycardia) ([place])    a. Seen on tele 07/2012.  . OSA on CPAP    pt stated he does not wear CPAP because he no longer has sleep apnea  . Pneumonia   . Pseudoaneurysm of left femoral artery ([place])    a. post cath s/p compression 07/2012, associated w/ anemia.  . Sciatica 2012   Qualifier: Diagnosis of  By: Madilyn Fireman MD, [place]      Patient Active Problem List   Diagnosis Date  Noted  . Small cell carcinoma ([place]) 03/09/2018  . Diverticulitis possible 03/05/2018  . Thrombocytopenia ([place]) 03/05/2018  . Acute on chronic combined systolic and diastolic CHF (congestive heart failure) ([place]) 01/12/2018  . Multifocal pneumonia 01/11/2018  . Primary osteoarthritis of right shoulder 01/27/2017  . Bilateral calf pain 01/27/2017  . Trochanteric bursitis of both hips 12/28/2016  . Demand ischemia ([place])   . AKI (acute kidney injury) ([place]) 03/17/2016  . Acute respiratory failure with hypoxia ([place]) 03/17/2016  . Hyperglycemia 03/17/2016  . Postlaminectomy syndrome, lumbar region 12/14/2015  . COPD exacerbation ([place]) 03/27/2015  . HNP (herniated nucleus pulposus), lumbar 02/18/2015  . Claudication of right lower extremity ([place]) 12/09/2014  . Lumbar degenerative disc disease 05/13/2014  . Mixed restrictive and obstructive lung disease ([place]) 01/16/2014  . Cerebrovascular disease 06/26/2013  . AAA (abdominal aortic aneurysm) without rupture ([place]) 06/24/2013  . Aortic calcification ([place]) 06/10/2013  . S/P CABG x 2 08/17/2012  . Cardiomyopathy, ischemic 07/25/2012  . Chronic systolic congestive heart failure ([place]) 07/11/2012  . CAD (coronary artery disease) of artery bypass graft 07/06/2012  . OSA (obstructive sleep apnea) 01/24/2012  . BPH (benign prostatic hyperplasia) 03/02/2011  . History of tobacco abuse 08/23/2010  . Hypoparathyroidism ([place]) 07/30/2010  . SCIATICA 11/10/2009  . INSULIN RESISTANCE SYNDROME 04/16/2007  . HYPERLIPIDEMIA NEC/NOS 11/14/2006  . HYPERTENSION,  BENIGN ESSENTIAL 11/14/2006    Past Surgical History:  Procedure Laterality Date  . CATARACT EXTRACTION, BILATERAL    . COLONOSCOPY    . CORONARY ARTERY BYPASS GRAFT  03-23-92  . CORONARY ARTERY BYPASS GRAFT N/A 08/14/2012   Procedure: REDO CORONARY ARTERY BYPASS GRAFTING (CABG);  Surgeon: Gaye Pollack, MD;  Location: [facility];  Service: Open Heart Surgery;  Laterality: N/A;  . INTRAOPERATIVE  TRANSESOPHAGEAL ECHOCARDIOGRAM N/A 08/14/2012   Procedure: INTRAOPERATIVE TRANSESOPHAGEAL ECHOCARDIOGRAM;  Surgeon: Gaye Pollack, MD;  Location: [facility] OR;  Service: Open Heart Surgery;  Laterality: N/A;  . LUMBAR LAMINECTOMY/DECOMPRESSION MICRODISCECTOMY Left 02/18/2015   Procedure: LUMBAR LAMINECTOMY/DECOMPRESSION MICRODISCECTOMY- LEFT FOUR-FIVE ;  Surgeon: Ashok Pall, MD;  Location: [facility];  Service: Neurosurgery;  Laterality: Left;        Home Medications    Prior to Admission medications   Medication Sig Start Date End Date Taking? Authorizing Provider  AMBULATORY NON FORMULARY MEDICATION Nebulizer - use as needed - with supplies.  Diagnosis of COPD J44.1 09/19/17   [name], MD  AMBULATORY NON FORMULARY MEDICATION Medication Name: portable O2 concentrator, (Inogen). Dx:J44.1.  He is oxygen dependent and having to go to chemotherapy appointments multiple times a week. Fax to [facility] 04/13/18   [name], MD  Ascorbic Acid (VITAMIN C) 1000 MG tablet Take 1,000 mg by mouth daily.    [provider]  calcium-vitamin D (OSCAL 500/200 D-3) 500-200 MG-UNIT tablet Take 1 tablet by mouth 2 (two) times daily. 03/21/18 03/21/19  Georgette Shell, MD  CARBOPLATIN IV Inject into the vein.    [provider]  carvedilol (COREG) 12.5 MG tablet Take 1 tablet (12.5 mg total) by mouth 2 (two) times daily with a meal. 07/05/17   Crenshaw, Denice Bors, MD  cilostazol (PLETAL) 100 MG tablet TAKE 1 TABLET BY MOUTH TWICE A DAY 04/11/18   Silverio Decamp, MD  Cyanocobalamin (VITAMIN B-12 PO) Take 1 tablet by mouth daily.    [provider]  ETOPOSIDE IV Inject into the vein.    [provider]  finasteride (PROSCAR) 5 MG tablet Take 1 tablet (5 mg total) by mouth every evening. 04/16/18   [name], MD  Fluticasone-Umeclidin-Vilant (TRELEGY ELLIPTA) 100-62.5-25 MCG/INH AEPB Inhale 1 Inhaler into the lungs daily. 01/09/18    [name], MD  furosemide (LASIX) 40 MG tablet Take 1 tablet (40 mg total) by mouth daily. 07/05/17   Lelon Perla, MD  ipratropium-albuterol (DUONEB) 0.5-2.5 (3) MG/3ML SOLN Take 3 mLs by nebulization every 2 (two) hours as needed (for SOB).     [provider]  Multiple Vitamin (MULTIVITAMIN) capsule Take 1 capsule by mouth daily.    [provider]  Nebulizer MISC Generic nebulizer plus supplies 03/26/15   Emeterio Reeve, DO  potassium chloride SA (K-DUR,KLOR-CON) 20 MEQ tablet Take 1 tablet (20 mEq total) by mouth daily. 04/03/18   Ladell Pier, MD  Probiotic Product (TRUBIOTICS PO) Take 1 tablet by mouth daily.    [provider]  prochlorperazine (COMPAZINE) 5 MG tablet Take 1 tablet (5 mg total) by mouth every 6 (six) hours as needed for nausea or vomiting. 04/03/18   Ladell Pier, MD  rosuvastatin (CRESTOR) 40 MG tablet Take 1 tablet (40 mg total) by mouth daily. 11/27/17   Lelon Perla, MD  senna-docusate (SENOKOT-S) 8.6-50 MG tablet Take 1 tablet by mouth at bedtime as needed for mild constipation. 01/18/18  Domenic Polite, MD  VENTOLIN HFA 108 (90 Base) MCG/ACT inhaler TAKE 2 PUFFS BY MOUTH EVERY 6 HOURS AS NEEDED FOR WHEEZE OR SHORTNESS OF BREATH Patient taking differently: Inhale 2 puffs into the lungs every 6 (six) hours as needed for wheezing.  12/28/17   [name], MD    Family History Family History  Problem Relation Age of Onset  . Heart attack Father 25  . Stroke Brother 50  . Hypertension Brother   . Hyperlipidemia Brother     Social History Social History   Tobacco Use  . Smoking status: Current Some Day Smoker    Packs/day: 1.00    Years: 60.00    Pack years: 60.00    Types: Cigarettes, Pipe  . Smokeless tobacco: Never Used  . Tobacco comment: 01/11/2018 "smokes cigars only now"  Substance Use Topics  . Alcohol use: Yes    Comment: Occasional  . Drug use: No     Allergies    Hydrochlorothiazide   Review of Systems Review of Systems  Constitutional: Positive for fatigue and fever.  Respiratory: Positive for cough and shortness of breath.   All other systems reviewed and are negative.    Physical Exam Updated Vital Signs BP 107/84   Pulse 100   Temp 98.5 F (36.9 C) (Oral)   Resp (!) 30   SpO2 91%   Physical Exam Vitals signs and nursing note reviewed.  Constitutional:      General: He is not in acute distress.    Appearance: He is well-developed.  HENT:     Head: Normocephalic and atraumatic.  Eyes:     Conjunctiva/sclera: Conjunctivae normal.     Pupils: Pupils are equal, round, and reactive to light.  Neck:     Musculoskeletal: Normal range of motion and neck supple.  Cardiovascular:     Rate and Rhythm: Normal rate and regular rhythm.     Heart sounds: Normal heart sounds.  Pulmonary:     Effort: Pulmonary effort is normal. No respiratory distress.     Comments: Decreased breath sounds at left base Abdominal:     General: There is no distension.     Palpations: Abdomen is soft.     Tenderness: There is no abdominal tenderness.  Musculoskeletal: Normal range of motion.        General: No deformity.  Skin:    General: Skin is warm and dry.  Neurological:     Mental Status: He is alert and oriented to person, place, and time.      ED Treatments / Results  Labs (all labs ordered are listed, but only abnormal results are displayed) Labs Reviewed  COMPREHENSIVE METABOLIC PANEL - Abnormal; Notable for the following components:      Result Value   Potassium 3.3 (*)    Glucose, Bld 127 (*)    Calcium 8.6 (*)    Total Protein 6.1 (*)    Albumin 3.1 (*)    GFR calc non Af Amer 59 (*)    All other components within normal limits  CBC WITH DIFFERENTIAL/PLATELET - Abnormal; Notable for the following components:   WBC 18.1 (*)    RBC 2.24 (*)    Hemoglobin 6.6 (*)    HCT 21.5 (*)    RDW 17.2 (*)    Neutro Abs 14.3 (*)     Monocytes Absolute 1.7 (*)    Abs Immature Granulocytes 0.57 (*)    All other components within normal limits  PROTIME-INR - Abnormal;  Notable for the following components:   Prothrombin Time 16.1 (*)    INR 1.3 (*)    All other components within normal limits  CULTURE, BLOOD (ROUTINE X 2)  CULTURE, BLOOD (ROUTINE X 2)  URINALYSIS, ROUTINE W REFLEX MICROSCOPIC  BRAIN NATRIURETIC PEPTIDE  INFLUENZA PANEL BY PCR (TYPE A & B)  I-STAT TROPONIN, ED  TYPE AND SCREEN  PREPARE RBC (CROSSMATCH)    EKG EKG Interpretation  Date/Time:  Thursday April 19 2018 16:43:52 EDT Ventricular Rate:  94 PR Interval:    QRS Duration: 166 QT Interval:  390 QTC Calculation: 488 R Axis:   86 Text Interpretation:  Sinus rhythm Right bundle branch block Confirmed by Dene Gentry [phone]) on 04/19/2018 7:16:21 PM   Radiology Dg Chest Port 1 View  Result Date: 04/19/2018 CLINICAL DATA:  Low-grade fever for the last 3 days. Shortness of breath. EXAM: PORTABLE CHEST 1 VIEW COMPARISON:  04/17/2018.  03/14/2018 FINDINGS: Right arm PICC tip in the SVC above the right atrium. The right lung remains clear. There is worsening pneumonia within the left lower lobe. Left upper lobe remains largely clear. IMPRESSION: Worsening of left lower lobe pneumonia. Electronically Signed   By: Nelson Chimes M.D.   On: 04/19/2018 16:46    Procedures Procedures (including critical care time)  Medications Ordered in ED Medications  vancomycin (VANCOCIN) 1,750 mg in sodium chloride 0.9 % 500 mL IVPB (has no administration in time range)  ceFEPIme (MAXIPIME) 2 g in sodium chloride 0.9 % 100 mL IVPB (2 g Intravenous New Bag/Given 04/19/18 1638)  0.9 %  sodium chloride infusion (Manually program via Guardrails IV Fluids) (has no administration in time range)  HYDROcodone-acetaminophen (NORCO/VICODIN) 5-325 MG per tablet 1 tablet (1 tablet Oral Given 04/19/18 1559)     Initial Impression / Assessment and Plan / ED Course  I have  reviewed the triage vital signs and the nursing notes.  Pertinent labs & imaging results that were available during my care of the patient were reviewed by me and considered in my medical decision making (see chart for details).        MDM  Screen complete  Patient is presenting from oncology clinic for evaluation of anemia and possible pneumonia.  Patient with evidence clinically of likely pneumonia.  Screening labs obtained reveal anemia with concurrent left lower lobe pneumonia.  Patient would benefit from admission.    Hospitalist service Karleen Hampshire)  is aware of case and will evaluate for admission.      Final Clinical Impressions(s) / ED Diagnoses   Final diagnoses:  Community acquired pneumonia of left lower lobe of lung ([place])  Anemia, unspecified type    ED Discharge Orders    None       Valarie Merino, MD 04/19/18 1709    Valarie Merino, MD 04/19/18 [phone]

## 2018-04-19 NOTE — Telephone Encounter (Signed)
1000 call- Pt's wife/HPOA Helene Kelp called around 0900 during system downtime.  Unable to call back until 1000.  Helene Kelp states she called [facility]) in the meantime for an RN visit, reports that they are on their way to assess [facility].  Helene Kelp reports 85% O2 sat at 2am and temp 100.0 with coughing exacerbated by supine position.  Wife increased home O2 through [place] from 2L/min to 3L/min, O2 sat increased to lower 90s.  Wife took pt off oxygen this am b/c pt stopped coughing and his O2 sats stayed in the 90s on RA, pt gave himself a nebulizer breathing tx.  Per PA Lucianne Lei advised pt/pt's wife to allow RN from [facility] to assess and that [facility] RN will call back at 1130 to re-evaluated based on her findings.  Pt & wife VU & know to call back with any concerns before then.  1045 call- Pt's wife called triage RN, transferred to charge desk, phone given to [facility] RN.  Stated Malin wanted to speak with RN.  Findings reported by [facility]:  HR 101, BP 120/60, O2 sats ranging from 70s to 90s with and without oxygen but mostly stable at lower 90s, temp 99.9 orally.  No N/V/D or CP/increased SOB (so long as pt is upright).  No JVD reported.  [place] Nurse states "he doesn't look good, his fingers are pale".  Advised [place] nurse and Helene Kelp that pt would be seen today in [facility] as soon as they could get here.  Nurse/pt/pt's wife VU of this and to call back before then if anything changes.  PA Lucianne Lei aware.

## 2018-04-19 NOTE — Progress Notes (Signed)
TWO SETS OF BLOOD CULTURES DRAWN FROM PICC AND RFA.    PICC DRESSING CHANGED.  Pt presents with increased SOB/DOE from baseline, particularly when he lies down at night to sleep.  Pt reports sleeping on 2 pillows and requiring 3Lmin [place] home oxygen instead of usual 2L/min to keep his O2 sats above 90%.  No JVD present.  Hx CHF.  A&Ox4.  Vocalizing frequently but denies pain.  PICC line in place, most recent scan shows appropriate placement, however pt reports some pain when being flushed.  Pressure when flushing with NS, blood return present.  No swelling or redness or other skin issues noted at or above insertion site.  Dressing C/D/I when assessed upon arrival with both caps in place and lines clamped.  Temperature at home varies between 100-101.  Currently afebrile.  Denies N/V/D, wet cough, changes in bowels or urination, or CP.  PA Lucianne Lei aware and at chairside to assess, aware of VS.  No further orders for fluids or medications given during [facility] visit.  Phone report given by PA Sandi Mealy to RN ED Charge [place].  Pt transported by [facility] to ED with spouse and belongings and home oxygen tank.

## 2018-04-19 NOTE — Progress Notes (Signed)
Symptoms Management Clinic Progress Note   Joseph Berg [id number] 09-22-1933 85 y.o.  Joseph Berg is managed by Dr. Dominica Severin B. 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  Small cell lung cancer: The patient is status post cycle 2 of carboplatin and etoposide with G-CSF support which was dosed on 04/03/2018.  He is scheduled to have a port placed on 04/19/2018.  He is due to have a dressing change of his PICC line tomorrow.  We will forego this given the fact that it will be discontinued after his port is placed.  Anemia: A CBC returned with a hemoglobin of 7.4 today.  This was reviewed with Dr. Benay Spice.  It was elected to forego a transfusion given that he has had hemoglobins that have ranged from 6.7 to 9.0 recently without transfusions being completed.  Hypomagnesemia: The patient's magnesium level today returned slightly low at 1.5.  He was not given magnesium as I suspect that this will improve as his diarrhea abates.  Fever: The patient has had fevers from 100.5 to 101.2 over the past several evenings.  He is currently afebrile.  A blood culture was collected today.  No antibiotics were given based on the patient's WBC and on the fact that he is afebrile at this point.  Please see After Visit Summary for patient specific instructions.  Future Appointments  Date Time Provider [employer]  04/23/2018 10:30 AM [facility] ROOM [facility] None   04/23/2018 12:00 PM [facility] 1 [facility] [place]  04/30/2018  9:15 AM [facility] LAB 2 [facility] None  04/30/2018  9:30 AM [facility] [place] FLUSH [facility] None  04/30/2018 10:00 AM Ladell Pier, MD [facility] None  04/30/2018 11:30 AM [facility] INFUSION [facility] None  05/01/2018  1:30 PM [facility] INFUSION [facility] None  05/02/2018  1:30 PM [facility] INFUSION [facility] None  05/05/2018 12:30 PM [facility] [place] FLUSH [facility] None  05/28/2018  3:00 PM [name], MD [facility] None  07/25/2018  2:40 PM Crenshaw, Denice Bors, MD [facility] None    No orders of the defined types were placed in this encounter.      Subjective:   Patient ID:  Joseph Berg is a 85 y.o. (DOB [birth year]) male.  Chief Complaint:  Chief Complaint  Patient presents with  . Diarrhea    HPI Joseph Berg  is an 85 year old male with a history of an extensive stage small cell lung cancer who is managed by Dr. Dominica Severin B. Sherrill.  His disease is complicated by bone marrow involvement of metastatic small cell carcinoma which has caused him to have thrombocytopenia and anemia.  He is status post cycle 2 of carboplatin and etoposide with G-CSF which was dosed on 04/03/2018.  [name] has multiple comorbidities including COPD, coronary artery disease, congestive heart failure, renal insufficiency, coagulopathy, anemia, leukopenia, and thrombocytopenia secondary to his small cell lung cancer and chemotherapy.  He presents today with a report of for nausea, diarrhea, fatigue, and a fever of 100.5-101.2 over the past several nights.  A chest x-ray was completed which showed  a stable left lower lobe pulmonary mass versus airspace consolidation.  He has not taken anything for his diarrhea.  He continues on home oxygen at 2 L/min via nasal cannula.  Medications: I have reviewed the patient's current medications.  Allergies:  Allergies  Allergen Reactions  . Hydrochlorothiazide Other (See Comments)     Affected renal function.  "affected kidneys and calcium level"    Past Medical History:  Diagnosis Date  . BPH (benign prostatic hyperplasia)   . CAD (coronary artery disease)    a. s/p CABG x1 with LIMA-LAD 1994. b. LM & 3v CAD by cath 07/2012  . CHF (congestive heart failure) ([place])    a. EF 35-40% by echo 07/2012.  [name] Complication of anesthesia    affected patients memory. Pt stated "I couldn't remember anything for three months...just bits and pieces"  . Emphysema    a. Moderate emphysema by CT 06/2012.  [name] Heart attack ([place]) 1994  . Hyperlipidemia   . Hypertension   . Hypoparathyroidism ([place])   . NSVT (nonsustained ventricular tachycardia) ([place])    a. Seen on tele 07/2012.  . OSA on CPAP    pt stated he does not wear CPAP because he no longer has sleep apnea  . Pneumonia   . Pseudoaneurysm of left femoral artery ([place])    a. post cath s/p compression 07/2012, associated w/ anemia.  . Sciatica 2012   Qualifier: Diagnosis of  By: Madilyn Fireman MD, Barnetta Chapel      Past Surgical History:  Procedure Laterality Date  . CATARACT EXTRACTION, BILATERAL    . COLONOSCOPY    . CORONARY ARTERY BYPASS GRAFT  03-23-92  . CORONARY ARTERY BYPASS GRAFT N/A 08/14/2012   Procedure: REDO CORONARY ARTERY BYPASS GRAFTING (CABG);  Surgeon: Gaye Pollack, MD;  Location: [facility] [place];  Service: Open Heart Surgery;  Laterality: N/A;  . INTRAOPERATIVE TRANSESOPHAGEAL ECHOCARDIOGRAM N/A 08/14/2012   Procedure: INTRAOPERATIVE TRANSESOPHAGEAL ECHOCARDIOGRAM;  Surgeon: Gaye Pollack, MD;  Location: [place] [facility] [facility];  Service: Open Heart Surgery;  Laterality: N/A;  . LUMBAR LAMINECTOMY/DECOMPRESSION MICRODISCECTOMY Left 02/18/2015   Procedure: LUMBAR LAMINECTOMY/DECOMPRESSION MICRODISCECTOMY- LEFT FOUR-FIVE ;  Surgeon: Ashok Pall, MD;  Location: [facility];  Service: Neurosurgery;  Laterality: Left;    Family History  Problem Relation Age of Onset  . Heart attack Father 32  . Stroke Brother 49  . Hypertension Brother   .  Hyperlipidemia Brother     Social History   Socioeconomic History  . Marital status: Married    Spouse name: Not on file  . Number of children: Not on file  . Years of education: Not on file  . Highest education level: Not on file  Occupational History  . Not on file  Social Needs  . Financial resource strain: Not on file  . Food insecurity:    Worry: Not on file    Inability: Not on file  . Transportation needs:    Medical: Not on file    Non-medical: Not on file  Tobacco Use  . Smoking status: Current Some Day Smoker    Packs/day: 1.00    Years: 60.00    Pack years: 60.00    Types: Cigarettes, Pipe  . Smokeless tobacco: Never Used  . Tobacco comment: 01/11/2018 "smokes cigars only now"  Substance and Sexual Activity  . Alcohol use: Yes    Comment: Occasional  . Drug use: No  . Sexual activity: Not on file    Comment: works part-time, Conservation officer, [employer] completed H,  married, no children, regular exercise.S  Lifestyle  . Physical activity:    Days per week: Not on file    Minutes per session: Not on file  . Stress: Not on file  Relationships  . Social connections:    Talks on phone: Not on file    Gets together: Not on file    Attends religious service: Not on file    Active member of club or organization: Not on file    Attends meetings of clubs or organizations: Not on file    Relationship status: Not on file  . Intimate partner violence:    Fear of current or ex partner: Not on file    Emotionally abused: Not on file    Physically abused: Not on file    Forced sexual activity: Not on file  Other Topics Concern  . Not on file  Social History Narrative  . Not on file    Past Medical History, Surgical history, Social history, and Family history were reviewed and updated as appropriate.   Please see review of systems for further details on the patient's review from today.   Review of Systems:  Review of Systems  Constitutional: Positive for chills,  fatigue and fever. Negative for appetite change and diaphoresis.  HENT: Negative for congestion, trouble swallowing and voice change.   Respiratory: Positive for shortness of breath. Negative for cough, chest tightness and wheezing.   Cardiovascular: Negative for chest pain, palpitations and leg swelling.  Gastrointestinal: Positive for diarrhea. Negative for abdominal distention, abdominal pain, blood in stool, constipation, nausea and vomiting.  Genitourinary: Negative for decreased urine volume and difficulty urinating.  Musculoskeletal: Negative for back pain and myalgias.  Neurological: Negative for dizziness, weakness, light-headedness and headaches.    Objective:   Physical Exam:  BP (!) 113/58 (BP Location: Left Arm, Patient Position: Sitting) Comment: nurse aware of bp  Pulse 91   Temp 98.1 F (36.7 C) (Oral)   Resp 18   Ht 5\' 9"  (1.753 m)   Wt 205 lb 4.8 oz (93.1 kg)   SpO2 99%   BMI 30.32 kg/m  ECOG: 2  Physical Exam Constitutional:      General: He is not in acute distress.    Appearance: He is not diaphoretic.  HENT:     Head: Normocephalic and atraumatic.     Mouth/Throat:     Mouth: Mucous membranes are moist.     Pharynx: No oropharyngeal exudate or posterior oropharyngeal erythema.  Eyes:     General: No scleral icterus.       Right eye: No discharge.        Left eye: No discharge.     Conjunctiva/sclera: Conjunctivae normal.  Cardiovascular:     Rate and Rhythm: Normal rate and regular rhythm.     Heart sounds: Normal heart sounds. No murmur. No friction rub. No gallop.      Comments: No presacral edema. Pulmonary:     Effort: Pulmonary effort is normal. Tachypnea present.     Breath sounds: Examination of the left-lower field reveals decreased breath sounds. Decreased breath sounds present. No rales.  Musculoskeletal:     Right lower leg: Edema present.     Left lower leg: Edema present.  Skin:    General: Skin is warm and dry.     Findings: No  erythema or rash.  Neurological:     Mental Status: He is alert.     Gait: Gait abnormal (The patient is ambulating with  use of a wheelchair.).     Lab Review:     Component Value Date/Time   NA 139 04/17/2018 1456   K 4.0 04/17/2018 1456   CL 100 04/17/2018 1456   CO2 28 04/17/2018 1456   GLUCOSE 115 (H) 04/17/2018 1456   BUN 13 04/17/2018 1456   CREATININE 1.15 04/17/2018 1456   CREATININE 0.91 02/14/2018 1011   CALCIUM 9.6 04/17/2018 1456   CALCIUM 10.8 (H) 07/22/2010 0907   PROT 5.8 (L) 04/03/2018 0929   ALBUMIN 3.1 (L) 04/03/2018 0929   AST 21 04/03/2018 0929   ALT 14 04/03/2018 0929   ALKPHOS 159 (H) 04/03/2018 0929   BILITOT 1.4 (H) 04/03/2018 0929   GFRNONAA 59 (L) 04/17/2018 1456   GFRNONAA 78 02/14/2018 1011   GFRAA >60 04/17/2018 1456   GFRAA 91 02/14/2018 1011       Component Value Date/Time   WBC 16.9 (H) 04/19/2018 1208   WBC 7.1 03/21/2018 0510   RBC 2.32 (L) 04/19/2018 1208   HGB 7.0 (L) 04/19/2018 1208   HCT 21.9 (L) 04/19/2018 1208   PLT 143 (L) 04/19/2018 1208   MCV 94.4 04/19/2018 1208   MCH 30.2 04/19/2018 1208   MCHC 32.0 04/19/2018 1208   RDW 17.2 (H) 04/19/2018 1208   LYMPHSABS 1.1 04/19/2018 1208   MONOABS 1.7 (H) 04/19/2018 1208   EOSABS 0.0 04/19/2018 1208   BASOSABS 0.0 04/19/2018 1208   -------------------------------  Imaging from last 24 hours (if applicable):  Radiology interpretation: Dg Chest 2 View  Result Date: 04/17/2018 CLINICAL DATA:  Diarrhea and fever. Lung cancer. EXAM: CHEST - 2 VIEW COMPARISON:  03/14/2018 FINDINGS: Right PICC line in stable position. Postsurgical changes from CABG. Cardiomediastinal silhouette is normal. Mediastinal contours appear intact. Calcific atherosclerotic disease and tortuosity of the aorta. There is no evidence of pleural effusion or pneumothorax. Left lower lobe pulmonary mass versus airspace consolidation, stable. Osseous structures are without acute abnormality. Soft tissues are grossly  normal. IMPRESSION: Stable left lower lobe pulmonary mass versus airspace consolidation. Electronically Signed   By: Fidela Salisbury M.D.   On: 04/17/2018 14:44   Ct Head W & Wo Contrast  Result Date: 03/21/2018 CLINICAL DATA:  Staging lung EXAM: CT HEAD WITHOUT AND WITH CONTRAST TECHNIQUE: Contiguous axial images were obtained from the base of the skull through the vertex without and with intravenous contrast CONTRAST:  57mL OMNIPAQUE IOHEXOL 300 MG/ML  SOLN COMPARISON:  None. FINDINGS: Brain: Remote appearing infarct in the parasagittal right parietal and occipital lobes, overall moderate volume. Small-vessel ischemic change in the cerebral white matter. Left para median falcine calcification without detected superimposed enhancement. This could be a calcified meningioma or nodular dural calcification. No evidence of metastatic disease. Vascular: Atherosclerotic calcification. Skull: No evident bony metastasis. Sinuses/Orbits: Negative IMPRESSION: 1. No evidence of metastatic disease. 2. Remote right occipital parietal infarct. Electronically Signed   By: Monte Fantasia M.D.   On: 03/21/2018 12:01        This case was discussed with Dr. Benay Spice. He expressed agreement with my management of this patient.

## 2018-04-20 ENCOUNTER — Other Ambulatory Visit: Payer: Self-pay | Admitting: Oncology

## 2018-04-20 DIAGNOSIS — Z9221 Personal history of antineoplastic chemotherapy: Secondary | ICD-10-CM | POA: Diagnosis not present

## 2018-04-20 DIAGNOSIS — Z9981 Dependence on supplemental oxygen: Secondary | ICD-10-CM | POA: Diagnosis not present

## 2018-04-20 DIAGNOSIS — I252 Old myocardial infarction: Secondary | ICD-10-CM | POA: Diagnosis not present

## 2018-04-20 DIAGNOSIS — J441 Chronic obstructive pulmonary disease with (acute) exacerbation: Secondary | ICD-10-CM

## 2018-04-20 DIAGNOSIS — E876 Hypokalemia: Secondary | ICD-10-CM | POA: Diagnosis present

## 2018-04-20 DIAGNOSIS — D649 Anemia, unspecified: Secondary | ICD-10-CM | POA: Diagnosis not present

## 2018-04-20 DIAGNOSIS — Z888 Allergy status to other drugs, medicaments and biological substances status: Secondary | ICD-10-CM | POA: Diagnosis not present

## 2018-04-20 DIAGNOSIS — C7952 Secondary malignant neoplasm of bone marrow: Secondary | ICD-10-CM | POA: Diagnosis not present

## 2018-04-20 DIAGNOSIS — C3490 Malignant neoplasm of unspecified part of unspecified bronchus or lung: Secondary | ICD-10-CM | POA: Diagnosis not present

## 2018-04-20 DIAGNOSIS — J189 Pneumonia, unspecified organism: Secondary | ICD-10-CM | POA: Diagnosis not present

## 2018-04-20 DIAGNOSIS — Z7189 Other specified counseling: Secondary | ICD-10-CM | POA: Insufficient documentation

## 2018-04-20 DIAGNOSIS — I2510 Atherosclerotic heart disease of native coronary artery without angina pectoris: Secondary | ICD-10-CM | POA: Diagnosis not present

## 2018-04-20 DIAGNOSIS — D696 Thrombocytopenia, unspecified: Secondary | ICD-10-CM | POA: Diagnosis not present

## 2018-04-20 DIAGNOSIS — D6481 Anemia due to antineoplastic chemotherapy: Secondary | ICD-10-CM | POA: Diagnosis not present

## 2018-04-20 DIAGNOSIS — T451X5A Adverse effect of antineoplastic and immunosuppressive drugs, initial encounter: Secondary | ICD-10-CM

## 2018-04-20 DIAGNOSIS — I5022 Chronic systolic (congestive) heart failure: Secondary | ICD-10-CM | POA: Diagnosis not present

## 2018-04-20 DIAGNOSIS — Z951 Presence of aortocoronary bypass graft: Secondary | ICD-10-CM | POA: Diagnosis not present

## 2018-04-20 DIAGNOSIS — Z8249 Family history of ischemic heart disease and other diseases of the circulatory system: Secondary | ICD-10-CM | POA: Diagnosis not present

## 2018-04-20 DIAGNOSIS — I509 Heart failure, unspecified: Secondary | ICD-10-CM | POA: Diagnosis not present

## 2018-04-20 DIAGNOSIS — N400 Enlarged prostate without lower urinary tract symptoms: Secondary | ICD-10-CM

## 2018-04-20 DIAGNOSIS — G4733 Obstructive sleep apnea (adult) (pediatric): Secondary | ICD-10-CM | POA: Diagnosis present

## 2018-04-20 DIAGNOSIS — E785 Hyperlipidemia, unspecified: Secondary | ICD-10-CM | POA: Diagnosis not present

## 2018-04-20 DIAGNOSIS — D638 Anemia in other chronic diseases classified elsewhere: Secondary | ICD-10-CM | POA: Diagnosis not present

## 2018-04-20 DIAGNOSIS — Z79899 Other long term (current) drug therapy: Secondary | ICD-10-CM | POA: Diagnosis not present

## 2018-04-20 DIAGNOSIS — I4510 Unspecified right bundle-branch block: Secondary | ICD-10-CM | POA: Diagnosis not present

## 2018-04-20 DIAGNOSIS — J440 Chronic obstructive pulmonary disease with acute lower respiratory infection: Secondary | ICD-10-CM

## 2018-04-20 DIAGNOSIS — Y95 Nosocomial condition: Secondary | ICD-10-CM | POA: Diagnosis present

## 2018-04-20 DIAGNOSIS — J181 Lobar pneumonia, unspecified organism: Secondary | ICD-10-CM | POA: Diagnosis not present

## 2018-04-20 DIAGNOSIS — Z66 Do not resuscitate: Secondary | ICD-10-CM | POA: Diagnosis present

## 2018-04-20 DIAGNOSIS — I110 Hypertensive heart disease with heart failure: Secondary | ICD-10-CM | POA: Diagnosis not present

## 2018-04-20 LAB — RESPIRATORY PANEL BY PCR
ADENOVIRUS-RVPPCR: NOT DETECTED
Bordetella pertussis: NOT DETECTED
Chlamydophila pneumoniae: NOT DETECTED
Coronavirus 229E: NOT DETECTED
Coronavirus HKU1: NOT DETECTED
Coronavirus NL63: NOT DETECTED
Coronavirus OC43: NOT DETECTED
INFLUENZA A-RVPPCR: NOT DETECTED
Influenza B: NOT DETECTED
Metapneumovirus: NOT DETECTED
Mycoplasma pneumoniae: NOT DETECTED
PARAINFLUENZA VIRUS 4-RVPPCR: NOT DETECTED
Parainfluenza Virus 1: NOT DETECTED
Parainfluenza Virus 2: NOT DETECTED
Parainfluenza Virus 3: NOT DETECTED
RHINOVIRUS / ENTEROVIRUS - RVPPCR: NOT DETECTED
Respiratory Syncytial Virus: NOT DETECTED

## 2018-04-20 LAB — BASIC METABOLIC PANEL
Anion gap: 10 (ref 5–15)
BUN: 17 mg/dL (ref 8–23)
CO2: 24 mmol/L (ref 22–32)
Calcium: 8.7 mg/dL — ABNORMAL LOW (ref 8.9–10.3)
Chloride: 105 mmol/L (ref 98–111)
Creatinine, Ser: 1.11 mg/dL (ref 0.61–1.24)
GFR calc Af Amer: >60 mL/min (ref >60)
GFR calc non Af Amer: >60 mL/min (ref >60)
Glucose, Bld: 192 mg/dL — ABNORMAL HIGH (ref 70–99)
POTASSIUM: 3.8 mmol/L (ref 3.5–5.1)
SODIUM: 139 mmol/L (ref 135–145)

## 2018-04-20 LAB — CBC WITH DIFFERENTIAL/PLATELET
Abs Immature Granulocytes: 1.02 10*3/uL — ABNORMAL HIGH (ref 0.00–0.07)
Basophils Absolute: 0 10*3/uL (ref 0.0–0.1)
Basophils Relative: 0 %
Eosinophils Absolute: 0 10*3/uL (ref 0.0–0.5)
Eosinophils Relative: 0 %
HCT: 28.5 % — ABNORMAL LOW (ref 39.0–52.0)
Hemoglobin: 9 g/dL — ABNORMAL LOW (ref 13.0–17.0)
Immature Granulocytes: 6 %
Lymphocytes Relative: 6 %
Lymphs Abs: 1.1 10*3/uL (ref 0.7–4.0)
MCH: 30 pg (ref 26.0–34.0)
MCHC: 31.6 g/dL (ref 30.0–36.0)
MCV: 95 fL (ref 80.0–100.0)
Monocytes Absolute: 0.8 10*3/uL (ref 0.1–1.0)
Monocytes Relative: 4 %
Neutro Abs: 15 10*3/uL — ABNORMAL HIGH (ref 1.7–7.7)
Neutrophils Relative %: 84 %
Platelets: 150 10*3/uL (ref 150–400)
RBC: 3 MIL/uL — AB (ref 4.22–5.81)
RDW: 16.8 % — ABNORMAL HIGH (ref 11.5–15.5)
WBC: 17.9 10*3/uL — AB (ref 4.0–10.5)
nRBC: 0 % (ref 0.0–0.2)

## 2018-04-20 LAB — BPAM RBC
Blood Product Expiration Date: 202003312359
Blood Product Expiration Date: 202004092359
ISSUE DATE / TIME: 202003122337
ISSUE DATE / TIME: 202003121744
Unit Type and Rh: 5100
Unit Type and Rh: 5100

## 2018-04-20 LAB — TYPE AND SCREEN
ABO/RH(D): O POS
Antibody Screen: NEGATIVE
UNIT DIVISION: 0
Unit division: 0

## 2018-04-20 LAB — URINALYSIS, ROUTINE W REFLEX MICROSCOPIC
Bilirubin Urine: NEGATIVE
GLUCOSE, UA: NEGATIVE mg/dL
Hgb urine dipstick: NEGATIVE
Ketones, ur: NEGATIVE mg/dL
LEUKOCYTE UA: NEGATIVE
NITRITE: NEGATIVE
PH: 5 (ref 5.0–8.0)
Protein, ur: NEGATIVE mg/dL
Specific Gravity, Urine: 1.025 (ref 1.005–1.030)

## 2018-04-20 LAB — STREP PNEUMONIAE URINARY ANTIGEN: Strep Pneumo Urinary Antigen: NEGATIVE

## 2018-04-20 LAB — INFLUENZA PANEL BY PCR (TYPE A & B)
Influenza A By PCR: NEGATIVE
Influenza B By PCR: NEGATIVE

## 2018-04-20 LAB — MAGNESIUM: Magnesium: 2.3 mg/dL (ref 1.7–2.4)

## 2018-04-20 LAB — PREPARE RBC (CROSSMATCH)

## 2018-04-20 LAB — HIV ANTIBODY (ROUTINE TESTING W REFLEX): HIV Screen 4th Generation wRfx: NONREACTIVE

## 2018-04-20 LAB — MRSA PCR SCREENING: MRSA by PCR: NEGATIVE

## 2018-04-20 MED ORDER — TRAZODONE HCL 50 MG PO TABS
25.0000 mg | ORAL_TABLET | Freq: Once | ORAL | Status: AC
  Administered 2018-04-20: 25 mg via ORAL
  Filled 2018-04-20: qty 1

## 2018-04-20 MED ORDER — ENSURE ENLIVE PO LIQD: 237.0000 mL | Freq: Two times a day (BID) | ORAL | Status: DC

## 2018-04-20 NOTE — Progress Notes (Signed)
Initial Nutrition Assessment  DOCUMENTATION CODES:   Obesity unspecified  INTERVENTION:  Encouraged addition of 1-2 ONS daily to meet calorie and protein needs  Ensure Enlive po BID, each supplement provides 350 kcal and 20 grams of protein Magic cup TID with meals, each supplement provides 290 kcal and 9 grams of protein   NUTRITION DIAGNOSIS:   Increased nutrient needs(calories and protein) related to cancer and cancer related treatments as evidenced by estimated needs, percent weight loss.   GOAL:   Patient will meet greater than or equal to 90% of their needs   MONITOR:   PO intake, Supplement acceptance, Labs, Weight trends  REASON FOR ASSESSMENT:   Malnutrition Screening Tool    ASSESSMENT:  85 year old male with medical history significant of metastatic lung cancer HTN, COPD, CHF, OSA, CAD s/p CABG x 2 presented to ED from outpatient oncology with SOB, hypotensive, and febrile and admitted with pneumonia.     Patient followed by Dr. Benay Spice at [facility] 2/29 - G-CSF 2/25 - Cycle 2  of etoposide/carboplatin and  2/06 - [place] admission for chemotherapy  Weights reviewed;  patient has lost 2.42 lbs since 3/06 (11% wt loss x 1 week, severe for time frame) 32.56 lbs since 2/11 (13.7% wt loss x 1 month, severe for time frame)  Delightful patient sitting in chair at time of visit and reports feeling good today. He endorses 100% of lunch with empty tray in room. He reports that he did not eat breakfast and that is normal. Patient reports good appetite and intake at home and stated that his wife is [place] and a wonderful Agricultural consultant 2-3 meals/day.   Discussed the importance of adequate calories and protein to maintain weight during treatment and encouraged patient to incorporate daily ONS. Patient agreeable to strawberry Ensure and all MC flavors during stay.   Patient denies any nausea/vomiting and reports good BMs daily without constipation or diarrhea.   NUTRITION -  FOCUSED PHYSICAL EXAM:    Most Recent Value  Orbital Region  Mild depletion  Upper Arm Region  Mild depletion  Thoracic and Lumbar Region  No depletion  Buccal Region  No depletion  Temple Region  Mild depletion  Clavicle Bone Region  No depletion  Clavicle and Acromion Bone Region  No depletion  Scapular Bone Region  No depletion  Dorsal Hand  Mild depletion  Patellar Region  Mild depletion  Anterior Thigh Region  No depletion  Posterior Calf Region  Mild depletion  Edema (RD Assessment)  Mild [BLE,  non-pitting]  Hair  Reviewed  Eyes  Reviewed  Mouth  Reviewed  Skin  Reviewed  Nails  Reviewed       Diet Order:  50% x 1 recorded meal Diet Order            Diet regular Room service appropriate? Yes; Fluid consistency: Thin  Diet effective now              EDUCATION NEEDS:   Education needs have been addressed  Skin:     Last BM:  3/12  Height:   Ht Readings from Last 1 Encounters:  04/19/18 5\' 9"  (1.753 m)    Weight:   Wt Readings from Last 1 Encounters:  04/19/18 93.2 kg    Ideal Body Weight:     BMI:  Body mass index is 30.34 kg/m.  Estimated Nutritional Needs:   Kcal:  2115-2275  Protein:  113-121 grams  Fluid:  >2.2L/day    Vinnie Level  Truddie Crumble, [place], [place]  After Hours/Weekend Pager: [phone]

## 2018-04-20 NOTE — Progress Notes (Signed)
These preliminary result these preliminary results were noted.  Awaiting final report.

## 2018-04-20 NOTE — Plan of Care (Signed)
  Problem: Education: Goal: Knowledge of General Education information will improve Description: Including pain rating scale, medication(s)/side effects and non-pharmacologic comfort measures Outcome: Progressing   Problem: Health Behavior/Discharge Planning: Goal: Ability to manage health-related needs will improve Outcome: Progressing   Problem: Clinical Measurements: Goal: Ability to maintain clinical measurements within normal limits will improve Outcome: Progressing Goal: Respiratory complications will improve Outcome: Progressing Goal: Cardiovascular complication will be avoided Outcome: Progressing   Problem: Activity: Goal: Risk for activity intolerance will decrease Outcome: Progressing   Problem: Nutrition: Goal: Adequate nutrition will be maintained Outcome: Progressing   Problem: Pain Managment: Goal: General experience of comfort will improve Outcome: Progressing   Problem: Safety: Goal: Ability to remain free from injury will improve Outcome: Progressing   Problem: Skin Integrity: Goal: Risk for impaired skin integrity will decrease Outcome: Progressing   

## 2018-04-20 NOTE — Evaluation (Signed)
Occupational Therapy Evaluation Patient Details Name: Joseph Berg MRN: [id number] DOB: Nov 11, 1933 Today's Date: 04/20/2018    History of Present Illness 85 yo male admitted with Pna. Hx of met lung cancer, NSVT, COPD-O2 PRN, HF, sciatica   Clinical Impression   Pt admitted with PNA. Pt currently with functional limitations due to the deficits listed below (see OT Problem List).  Pt will benefit from skilled OT to increase their safety and independence with ADL and functional mobility for ADL to facilitate discharge to venue listed below.      Follow Up Recommendations  Supervision - Intermittent    Equipment Recommendations  None recommended by OT       Precautions / Restrictions Precautions Precautions: Fall Precaution Comments: O2 PRN at baseline Restrictions Weight Bearing Restrictions: No      Mobility Bed Mobility Overal bed mobility: Needs Assistance Bed Mobility: Supine to Sit     Supine to sit: Min assist Sit to supine: Min guard;HOB elevated   General bed mobility comments: [place]iance on bed. Increaesd time.   Transfers Overall transfer level: Needs assistance Equipment used: Rolling walker (2 wheeled) Transfers: Sit to/from [facility] Sit to Stand: Min guard Stand pivot transfers: Min guard       General transfer comment: Close guard for safety.     Balance Overall balance assessment: Mild deficits observed, not formally tested                                         ADL either performed or assessed with clinical judgement   ADL Overall ADL's : Needs assistance/impaired Eating/Feeding: Set up;Sitting   Grooming: Set up;Standing   Upper Body Bathing: Set up;Sitting   Lower Body Bathing: Sit to/from stand;Minimal assistance   Upper Body Dressing : Set up;Sitting   Lower Body Dressing: Minimal assistance;Sit to/from stand   Toilet Transfer: Min guard;RW   Toileting- Water quality scientist and Hygiene:  Min guard;Sit to/from stand;Cueing for sequencing;Cueing for safety       Functional mobility during ADLs: Min guard       Vision Patient Visual Report: No change from baseline              Pertinent Vitals/Pain Pain Assessment: No/denies pain     Hand Dominance Right   Extremity/Trunk Assessment Upper Extremity Assessment Upper Extremity Assessment: Generalized weakness   Lower Extremity Assessment Lower Extremity Assessment: Generalized weakness   Cervical / Trunk Assessment Cervical / Trunk Assessment: Kyphotic   Communication Communication Communication: No difficulties   Cognition Arousal/Alertness: Awake/alert Behavior During Therapy: WFL for tasks assessed/performed Overall Cognitive Status: Within Functional Limits for tasks assessed                                                Home Living Family/patient expects to be discharged to:: Private residence Living Arrangements: Spouse/significant other Available Help at Discharge: Family;Available 24 hours/day Type of Home: House Home Access: Stairs to enter [employer] of Steps: 1 partial step (~3-4 inches)    Home Layout: One level               Home Equipment: Cane - single point;Shower seat;Walker - 2 wheels          Prior Functioning/Environment Level of Independence:  Needs assistance  Gait / Transfers Assistance Needed: household ambulation without device ADL's / Homemaking Assistance Needed: Indep although increasing assist from wife due to fatigue   Comments: retired Administrator, did some work as a Presenter, 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 to get better.  OT Goal  Formulation: With patient Time For Goal Achievement: 04/27/18 Potential to Achieve Goals: Good  OT Frequency: Min 2X/week    AM-PAC OT "6 Clicks" Daily Activity     Outcome Measure Help from another person eating meals?: None Help from another person taking care of personal grooming?: None Help from another person toileting, which includes using toliet, bedpan, or urinal?: A Little Help from another person bathing (including washing, rinsing, drying)?: A Little Help from another person to put on and taking off regular upper body clothing?: None Help from another person to put on and taking off regular lower body clothing?: A Little 6 Click Score: 21   End of Session Equipment Utilized During Treatment: Rolling walker Nurse Communication: Mobility status  Activity Tolerance: Patient tolerated treatment well Patient left: in chair;with call bell/phone within reach;with chair alarm set  OT Visit Diagnosis: Unsteadiness on feet (R26.81);Muscle weakness (generalized) (M62.81)                Time: 8295-6213 OT Time Calculation (min): 10 min Charges:  OT General Charges $OT Visit: 1 Visit OT Treatments $Self Care/Home Management : 8-22 mins  Kari Baars, [place] [phone] Office- [phone], Edwena Felty D 04/20/2018, 12:42 PM

## 2018-04-20 NOTE — Progress Notes (Signed)
IP PROGRESS NOTE  Subjective:   Joseph Berg reports feeling better.  No diarrhea.  No complaint.  His wife is at the bedside.  Objective: Vital signs in last 24 hours: Blood pressure 105/64, pulse 77, temperature (!) 97.5 F (36.4 C), temperature source Oral, resp. rate 19, height _0  (1.753 m), weight 205 lb 7.5 oz (93.2 kg), SpO2 91 %.  Intake/Output from previous day: 03/12 0701 - 03/13 0700 In: 1210.1 [P.O.:240; Blood:773.5; IV Piggyback:196.6] Out: 550 [Urine:550]  Physical Exam:  HEENT: No thrush Lungs: Decreased breath sounds with inspiratory rhonchi at the lower posterior chest bilaterally, no respiratory distress Cardiac: Distant heart sounds, regular rate and rhythm Abdomen: Nontender, no hepatosplenomegaly Extremities: No leg edema   Portacath/PICC-without erythema  Lab Results: Recent Labs    04/19/18 1637 04/20/18 0410  WBC 18.1* 17.9*  HGB 6.6* 9.0*  HCT 21.5* 28.5*  PLT 157 150    BMET Recent Labs    04/19/18 1637 04/20/18 0410  NA 138 139  K 3.3* 3.8  CL 103 105  CO2 26 24  GLUCOSE 127* 192*  BUN 17 17  CREATININE 1.15 1.11  CALCIUM 8.6* 8.7*    No results found for: CEA1  Studies/Results: Dg Chest Port 1 View  Result Date: 04/19/2018 CLINICAL DATA:  Low-grade fever for the last 3 days. Shortness of breath. EXAM: PORTABLE CHEST 1 VIEW COMPARISON:  04/17/2018.  03/14/2018 FINDINGS: Right arm PICC tip in the SVC above the right atrium. The right lung remains clear. There is worsening pneumonia within the left lower lobe. Left upper lobe remains largely clear. IMPRESSION: Worsening of left lower lobe pneumonia. Electronically Signed   By: Nelson Chimes M.D.   On: 04/19/2018 16:46    Medications: I have reviewed the patient's current medications.  Assessment/Plan:  1.Extensive stage small cell lung cancer  CT chest 03/05/2018-left hilar mass, small mediastinal lymph nodes, atelectasis versus consolidation versus mass in the superior  segment of the left lower lobe  Bone marrow biopsy 03/07/2018-extensive involvement of the bone marrow with metastatic small cell carcinoma consistent with a lung primary, small foci of non-small cell differentiation  Cycle 1 etoposide/carboplatin 03/09/2018, G-CSF started 03/12/2018 and discontinued 03/20/2018  CT head 03/21/2018-no evidence of metastatic disease  Cycle 2 etoposide/carboplatin, dose escalation of etoposide and carboplatin 04/03/2018 2. Thrombocytopenia secondary to #1-improved 3.COPD 4.History of coronary artery disease 5.CHF 6.BPH 7.Admission with pneumonia December 2019 8.Mild renal insufficiency-improved 9. Anemia secondary to chemotherapy and small cell lung cancer involving the bone marrow, status post 2 units of red cells 04/19/2018 04/03/2018 10.  Admission 04/19/2018 with fever, diarrhea, and cough-chest x-ray consistent with left lung pneumonia   Joseph Berg appears improved compared to hospital admission yesterday.  He was admitted with a febrile illness.  He most likely has hospital versus community-acquired pneumonia.  Influenza testing was negative.  A respiratory panel is pending.  This occurs in the setting of extensive stage small cell lung cancer.  [name] has completed 2 cycles of systemic chemotherapy for treatment of small cell lung cancer.  I recommend adding immunotherapy with cycle 3.  He will receive atezolizumab beginning with cycle 3.  We reviewed potential toxicities associated with the PD1 inhibitors including the chance for a rash, diarrhea, hepatitis, and autoimmune illnesses.  He agrees to proceed.  Recommendations: 1.  Continue management of pneumonia and COPD per the medical service 2.  Outpatient follow-up is scheduled at the Cancer center for cycle 3 etoposide/carboplatin with addition of atezolizumab. 3.  Please call Oncology as needed, I will check on him 04/23/2018 if he remains in the hospital.  LOS: 0 days   [name], MD   04/20/2018, 7:28 AM

## 2018-04-20 NOTE — TOC Initial Note (Signed)
Transition of Care [facility]) - Initial/Assessment Note    Patient Details  Name: Joseph Berg MRN: [id number] Date of Birth: 04-12-1933  Transition of Care [facility]) CM/SW Contact:    Purcell Mouton, RN Phone Number: 04/20/2018, 3:33 PM  Clinical Narrative:      Pt admitted from Oncology office with complaining of cough, sob, fever and generalized weakness.                    Patient Goals and CMS Choice  Pt states he will not need HH at present time. Pt plan to discharge home with wife. His goals are to get better.      Expected Discharge Plan and Services  Plan to discharge home with wife and no HH.        Expected Discharge Date: (unknown)                        Prior Living Arrangements/Services                       Activities of Daily Living Home Assistive Devices/Equipment: Eyeglasses, Environmental consultant (specify type), Cane (specify quad or straight) ADL Screening (condition at time of admission) Patient's cognitive ability adequate to safely complete daily activities?: Yes Is the patient deaf or have difficulty hearing?: Yes Does the patient have difficulty seeing, even when wearing glasses/contacts?: No Does the patient have difficulty concentrating, remembering, or making decisions?: No Patient able to express need for assistance with ADLs?: Yes Does the patient have difficulty dressing or bathing?: No Independently performs ADLs?: Yes (appropriate for developmental age) Does the patient have difficulty walking or climbing stairs?: No Weakness of Legs: Both Weakness of Arms/Hands: Both  Permission Sought/Granted                  Emotional Assessment              Admission diagnosis:  Anemia, unspecified type [D64.9] Community acquired pneumonia of left lower lobe of lung ([place]) [J18.1] Patient Active Problem List   Diagnosis Date Noted  . Small cell lung cancer ([place]) 04/20/2018  . Goals of care, counseling/discussion 04/20/2018  . HCAP  (healthcare-associated pneumonia) 04/19/2018  . Small cell carcinoma ([place]) 03/09/2018  . Diverticulitis possible 03/05/2018  . Thrombocytopenia ([place]) 03/05/2018  . Acute on chronic combined systolic and diastolic CHF (congestive heart failure) ([place]) 01/12/2018  . Multifocal pneumonia 01/11/2018  . Primary osteoarthritis of right shoulder 01/27/2017  . Bilateral calf pain 01/27/2017  . Trochanteric bursitis of both hips 12/28/2016  . Demand ischemia ([place])   . AKI (acute kidney injury) ([place]) 03/17/2016  . Acute respiratory failure with hypoxia ([place]) 03/17/2016  . Hyperglycemia 03/17/2016  . Postlaminectomy syndrome, lumbar region 12/14/2015  . COPD exacerbation ([place]) 03/27/2015  . HNP (herniated nucleus pulposus), lumbar 02/18/2015  . Claudication of right lower extremity ([place]) 12/09/2014  . Lumbar degenerative disc disease 05/13/2014  . Mixed restrictive and obstructive lung disease ([place]) 01/16/2014  . Cerebrovascular disease 06/26/2013  . AAA (abdominal aortic aneurysm) without rupture ([place]) 06/24/2013  . Aortic calcification ([place]) 06/10/2013  . S/P CABG x 2 08/17/2012  . Cardiomyopathy, ischemic 07/25/2012  . Chronic systolic congestive heart failure ([place]) 07/11/2012  . CAD (coronary artery disease) of artery bypass graft 07/06/2012  . OSA (obstructive sleep apnea) 01/24/2012  . BPH (benign prostatic hyperplasia) 03/02/2011  . History of tobacco abuse 08/23/2010  . Hypoparathyroidism ([place]) 07/30/2010  . SCIATICA 11/10/2009  .  INSULIN RESISTANCE SYNDROME 04/16/2007  . HYPERLIPIDEMIA NEC/NOS 11/14/2006  . HYPERTENSION, BENIGN ESSENTIAL 11/14/2006   PCP:  [name], MD Pharmacy:   [facility], [address] [place] [address] Phone: [phone] Fax: [phone]     Social Determinants of Health (SDOH) Interventions    Readmission Risk Interventions 30 Day Unplanned Readmission Risk Score     ED to  Hosp-Admission (Current) from 04/19/2018 in [place]  30 Day Unplanned Readmission Risk Score (%)  41 Filed at 04/20/2018 1200     This score is the patient's risk of an unplanned readmission within 30 days of being discharged (0 -100%). The score is based on dignosis, age, lab data, medications, orders, and past utilization.   Low:  0-14.9   Medium: 15-21.9   High: 22-29.9   Extreme: 30 and above       No flowsheet data found.

## 2018-04-20 NOTE — Progress Notes (Signed)
PROGRESS NOTE    [name]  GBT:[id number] DOB: Jun 17, 1933 DOA: 04/19/2018 PCP: [name], MD  Brief Narrative:   Joseph Berg is a 85 y.o. male with medical history significant of metastatic small cell lung cancer, hypertension, COPD, chronic systolic heart failure completed 2 cycles of carboplatin and etoposide came in to oncology office for follow up , was found to be sob, febrile, hypotensive and was sent to ED for further evaluation and admission. on arrival he had low grade temp, hypoxic, tachypnea, hypotensive and tachycardic. Labs revealed leukocytosis, anemic with a hemoglobin of 6.6, BNP of 237, hypokalemic and hypomagnesemia of 1.5. influenza PCR is negative.  Blood cultures done and pending. CXR showed worsening left lower lobe pneumonia.   Assessment & Plan:   Active Problems:   HCAP (healthcare-associated pneumonia)  Acute on chronic respiratory failure with hypoxia secondary to left lower lobe pneumonia.  We will treat as health care associated pneumonia, with broad spectrum IV antibiotics.  Blood cultures ordered, urine for strep pneumonia antigen and legionella antigen ordered and negative Influenza PCR is negative. Respiratory panel is negative Stated the patient on IV solumedrol and continue with duo nebs.  Transition to oral prednisone the next 24 hours. On exam his wheezing and rhonchi improved.  [place] oxygen to keep sats greater than 90%.    COPD: Diffuse wheezing heard. Started him on IV solumedrol and duo nebs   Hypokalemia and hypomagnesemia replaced.   CAD s/p CABG: Pt denies any chest pain.    Hyperlipidemia: resume crestor.    Hypertension:  Well controlled.    Anemia of chronic disease: Possibly from chemo.  No hematochezia and hematemesis.  Anemia panel ordered.  2 units of prbc transfusion ordered.  Repeat H&H ordered post transfusion  Is around 9 Keep hemoglobin greater than 7.        DVT  prophylaxis: SCD'S Code Status: DNR Family Communication: None at bedside   disposition Plan: We will discharge in the next 24 hours if his breathing improves Consultants:   Oncology  Procedures: None Antimicrobials: Vancomycin and cefepime since admission  Subjective: Breathing and cough has improved but he reports his but not back to baseline yet  Objective: Vitals:   04/20/18 0230 04/20/18 0435 04/20/18 1212 04/20/18 1345  BP: 125/70 105/64  127/67  Pulse: 79 77  81  Resp: 20 19  18   Temp: 98.8 F (37.1 C) (!) 97.5 F (36.4 C)  97.6 F (36.4 C)  TempSrc: Oral Oral  Oral  SpO2: 93% 91% (!) 89% 93%  Weight:      Height:        Intake/Output Summary (Last 24 hours) at 04/20/2018 1600 Last data filed at 04/20/2018 0930 Gross per 24 hour  Intake 1330.14 ml  Output 550 ml  Net 780.14 ml   Filed Weights   04/19/18 1932  Weight: 93.2 kg    Examination:  General exam: not in distress but on 2lit of [place] oxygen.  Respiratory system: wheezing improved.  Cardiovascular system: S1 & S2 heard, RRR. No JVD, murmurs, rubs, gallops or clicks. No pedal edema. Gastrointestinal system: Abdomen is nondistended, soft and nontender. No organomegaly or masses felt. Normal bowel sounds heard. Central nervous system: Alert and oriented. No focal neurological deficits. Extremities: Symmetric 5 x 5 power. Skin: No rashes, lesions or ulcers Psychiatry:  Mood & affect appropriate.     Data Reviewed: I have personally reviewed following labs and imaging studies  CBC: Recent Labs  Lab  04/17/18 1456 04/19/18 1208 04/19/18 1637 04/20/18 0410  WBC 10.7* 16.9* 18.1* 17.9*  NEUTROABS 7.3 13.4* 14.3* 15.0*  HGB 7.4* 7.0* 6.6* 9.0*  HCT 23.6* 21.9* 21.5* 28.5*  MCV 94.4 94.4 96.0 95.0  PLT 66* 143* 157 976   Basic Metabolic Panel: Recent Labs  Lab 04/17/18 1456 04/19/18 1637 04/20/18 0410  NA 139 138 139  K 4.0 3.3* 3.8  CL 100 103 105  CO2 28 26 24   GLUCOSE 115* 127* 192*   BUN 13 17 17   CREATININE 1.15 1.15 1.11  CALCIUM 9.6 8.6* 8.7*  MG 1.5* 1.5* 2.3   GFR: Estimated Creatinine Clearance: 57.8 mL/min (by C-G formula based on SCr of 1.11 mg/dL). Liver Function Tests: Recent Labs  Lab 04/19/18 1637  AST 34  ALT 42  ALKPHOS 93  BILITOT 1.0  PROT 6.1*  ALBUMIN 3.1*   No results for input(s): LIPASE, AMYLASE in the last 168 hours. No results for input(s): AMMONIA in the last 168 hours. Coagulation Profile: Recent Labs  Lab 04/19/18 1637  INR 1.3*   Cardiac Enzymes: No results for input(s): CKTOTAL, CKMB, CKMBINDEX, TROPONINI in the last 168 hours. BNP (last 3 results) No results for input(s): PROBNP in the last 8760 hours. HbA1C: No results for input(s): HGBA1C in the last 72 hours. CBG: No results for input(s): GLUCAP in the last 168 hours. Lipid Profile: No results for input(s): CHOL, HDL, LDLCALC, TRIG, CHOLHDL, LDLDIRECT in the last 72 hours. Thyroid Function Tests: No results for input(s): TSH, T4TOTAL, FREET4, T3FREE, THYROIDAB in the last 72 hours. Anemia Panel: No results for input(s): VITAMINB12, FOLATE, FERRITIN, TIBC, IRON, RETICCTPCT in the last 72 hours. Sepsis Labs: No results for input(s): PROCALCITON, LATICACIDVEN in the last 168 hours.  Recent Results (from the past 240 hour(s))  Culture, Blood     Status: None (Preliminary result)   Collection Time: 04/17/18  2:56 PM  Result Value Ref Range Status   Specimen Description BLOOD LEFT ARM  Final   Special Requests   Final    BOTTLES DRAWN AEROBIC AND ANAEROBIC Blood Culture results may not be optimal due to an excessive volume of blood received in culture bottles   Culture   Final    NO GROWTH 2 DAYS Performed at [facility], [place] [address] [place], [place] [address]    Report Status PENDING  Incomplete  Culture, Blood     Status: None (Preliminary result)   Collection Time: 04/19/18 12:50 PM  Result Value Ref Range Status   Specimen Description BLOOD RIGHT  FOREARM  Final   Special Requests   Final    BOTTLES DRAWN AEROBIC AND ANAEROBIC Blood Culture adequate volume Performed at [facility], [place] [address], [place], [place] [address]    Culture PENDING  Incomplete   Report Status PENDING  Incomplete  Culture, Blood     Status: None (Preliminary result)   Collection Time: 04/19/18 12:55 PM  Result Value Ref Range Status   Specimen Description BLOOD PICC LINE  Final   Special Requests   Final    BOTTLES DRAWN AEROBIC AND ANAEROBIC Blood Culture adequate volume Performed at [facility], [place] [address], [place], [place] [address]    Culture PENDING  Incomplete   Report Status PENDING  Incomplete  Respiratory Panel by PCR     Status: None   Collection Time: 04/19/18  7:32 PM  Result Value Ref Range Status   Adenovirus NOT DETECTED NOT DETECTED  Final   Coronavirus 229E NOT DETECTED NOT DETECTED Final    Comment: (NOTE) The Coronavirus on the Respiratory Panel, DOES NOT test for the novel  Coronavirus (2019 nCoV)    Coronavirus HKU1 NOT DETECTED NOT DETECTED Final   Coronavirus NL63 NOT DETECTED NOT DETECTED Final   Coronavirus OC43 NOT DETECTED NOT DETECTED Final   Metapneumovirus NOT DETECTED NOT DETECTED Final   Rhinovirus / Enterovirus NOT DETECTED NOT DETECTED Final   Influenza A NOT DETECTED NOT DETECTED Final   Influenza B NOT DETECTED NOT DETECTED Final   Parainfluenza Virus 1 NOT DETECTED NOT DETECTED Final   Parainfluenza Virus 2 NOT DETECTED NOT DETECTED Final   Parainfluenza Virus 3 NOT DETECTED NOT DETECTED Final   Parainfluenza Virus 4 NOT DETECTED NOT DETECTED Final   Respiratory Syncytial Virus NOT DETECTED NOT DETECTED Final   Bordetella pertussis NOT DETECTED NOT DETECTED Final   Chlamydophila pneumoniae NOT DETECTED NOT DETECTED Final   Mycoplasma pneumoniae NOT DETECTED NOT DETECTED Final    Comment: Performed at [facility], [place] [address] [place], [place] [address]  MRSA PCR Screening      Status: None   Collection Time: 04/19/18  7:32 PM  Result Value Ref Range Status   MRSA by PCR NEGATIVE NEGATIVE Final    Comment:        The GeneXpert MRSA Assay (FDA approved for NASAL specimens only), is one component of a comprehensive MRSA colonization surveillance program. It is not intended to diagnose MRSA infection nor to guide or monitor treatment for MRSA infections. Performed at [facility], [place] [address], [place], [place] [address]          Radiology Studies: Dg Chest Port 1 View  Result Date: 04/19/2018 CLINICAL DATA:  Low-grade fever for the last 3 days. Shortness of breath. EXAM: PORTABLE CHEST 1 VIEW COMPARISON:  04/17/2018.  03/14/2018 FINDINGS: Right arm PICC tip in the SVC above the right atrium. The right lung remains clear. There is worsening pneumonia within the left lower lobe. Left upper lobe remains largely clear. IMPRESSION: Worsening of left lower lobe pneumonia. Electronically Signed   By: Nelson Chimes M.D.   On: 04/19/2018 16:46        Scheduled Meds: . sodium chloride  250 mL Intravenous Once  . finasteride  5 mg Oral QPM  . fluticasone furoate-vilanterol  1 puff Inhalation Daily   And  . umeclidinium bromide  1 puff Inhalation Daily  . methylPREDNISolone (SOLU-MEDROL) injection  60 mg Intravenous Q12H  . rosuvastatin  40 mg Oral Daily   Continuous Infusions: . ceFEPime (MAXIPIME) IV 1 g (04/20/18 0924)  . vancomycin       LOS: 0 days    Time spent: 34 minutes.     Hosie Poisson, MD [facility] Pager [phone]  If 7PM-7AM, please contact night-coverage [email or web address] Password [facility] 04/20/2018, 4:00 PM

## 2018-04-20 NOTE — Evaluation (Signed)
Physical Therapy Evaluation Patient Details Name: Joseph Berg MRN: [id number] DOB: [birth year] Today's Date: 04/20/2018   History of Present Illness  85 yo male admitted with Pna. Hx of met lung cancer, NSVT, COPD-O2 PRN, HF, sciatica  Clinical Impression  On eval, pt required Min guard-Min assist for mobility. He walked ~50 feet while holding on to the IV pole for support. O2 sat levels: at rest-89% on RA; during ambulation-80% on RA. Dyspnea 2-3/4 with activity. Pt fatigues fairly easily. Will continue to follow during hospital stay. Recommend pt resume HHPT once discharged.     Follow Up Recommendations Home health PT;Supervision/Assistance - 24 hour    Equipment Recommendations  None recommended by PT    Recommendations for Other Services       Precautions / Restrictions Precautions Precautions: Fall Precaution Comments: O2 PRN at baseline Restrictions Weight Bearing Restrictions: No      Mobility  Bed Mobility Overal bed mobility: Needs Assistance Bed Mobility: Supine to Sit;Sit to Supine     Supine to sit: Min guard;HOB elevated Sit to supine: Min guard;HOB elevated   General bed mobility comments: [place]iance on bed. Increaesd time.   Transfers Overall transfer level: Needs assistance   Transfers: Sit to/from Stand Sit to Stand: Min guard         General transfer comment: Close guard for safety.   Ambulation/Gait Ambulation/Gait assistance: Min assist;Min guard Gait Distance (Feet): 50 Feet Assistive device: IV Pole Gait Pattern/deviations: Step-through pattern;Decreased stride length     General Gait Details: slow gait speed. Pt began to fatigue after ~40 feet. Intermittent assist to steady. O2 sat dropped to 80% on RA, dyspnea 2/4.   Stairs            Wheelchair Mobility    Modified Rankin (Stroke Patients Only)       Balance Overall balance assessment: Mild deficits observed, not formally tested                                            Pertinent Vitals/Pain Pain Assessment: No/denies pain    Home Living Family/patient expects to be discharged to:: Private residence Living Arrangements: Spouse/significant other Available Help at Discharge: Family;Available 24 hours/day Type of Home: House Home Access: Stairs to enter   [employer] of Steps: 1 partial step (~3-4 inches)  Home Layout: One level Home Equipment: Cane - single point;Shower seat;Walker - 2 wheels      Prior Function Level of Independence: Needs assistance   Gait / Transfers Assistance Needed: household ambulation without device  ADL's / Homemaking Assistance Needed: Indep although increasing assist from wife due to fatigue  Comments: retired Administrator, did some work as a Presenter, 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, newspaper   Dominant Hand: Right    Extremity/Trunk Assessment   Upper Extremity Assessment Upper Extremity Assessment: Generalized weakness    Lower Extremity Assessment Lower Extremity Assessment: Generalized weakness    Cervical / Trunk Assessment Cervical / Trunk Assessment: Kyphotic  Communication   Communication: No difficulties  Cognition Arousal/Alertness: Awake/alert Behavior During Therapy: WFL for tasks assessed/performed Overall Cognitive Status: Within Functional Limits for tasks assessed  General Comments      Exercises     Assessment/Plan    PT Assessment Patient needs continued PT services  PT Problem List Decreased strength;Decreased balance;Decreased mobility;Decreased activity tolerance       PT Treatment Interventions DME instruction;Gait training;Therapeutic activities;Functional mobility training;Balance training;Patient/family education;Therapeutic exercise    PT Goals (Current goals can be found in the Care Plan section)  Acute Rehab PT Goals Patient Stated Goal: home. to get better.  PT Goal  Formulation: With patient/family Time For Goal Achievement: 05/04/18 Potential to Achieve Goals: Good    Frequency Min 3X/week   Barriers to discharge        Co-evaluation               AM-PAC PT "6 Clicks" Mobility  Outcome Measure Help needed turning from your back to your side while in a flat bed without using bedrails?: A Little Help needed moving from lying on your back to sitting on the side of a flat bed without using bedrails?: A Little Help needed moving to and from a bed to a chair (including a wheelchair)?: A Little Help needed standing up from a chair using your arms (e.g., wheelchair or bedside chair)?: A Little Help needed to walk in hospital room?: A Little Help needed climbing 3-5 steps with a railing? : A Lot 6 Click Score: 17    End of Session Equipment Utilized During Treatment: Gait belt;Oxygen Activity Tolerance: Patient limited by fatigue Patient left: in bed;with call bell/phone within reach;with bed alarm set;with family/visitor present   PT Visit Diagnosis: Muscle weakness (generalized) (M62.81);Difficulty in walking, not elsewhere classified (R26.2)    Time: 4621-9471 PT Time Calculation (min) (ACUTE ONLY): 17 min   Charges:   PT Evaluation $PT Eval Moderate Complexity: [place], PT Acute Rehabilitation Services Pager: [phone] Office: [phone]

## 2018-04-21 LAB — CBC
HCT: 25.7 % — ABNORMAL LOW (ref 39.0–52.0)
Hemoglobin: 8.5 g/dL — ABNORMAL LOW (ref 13.0–17.0)
MCH: 30.8 pg (ref 26.0–34.0)
MCHC: 33.1 g/dL (ref 30.0–36.0)
MCV: 93.1 fL (ref 80.0–100.0)
NRBC: 0.1 % (ref 0.0–0.2)
Platelets: 204 10*3/uL (ref 150–400)
RBC: 2.76 MIL/uL — ABNORMAL LOW (ref 4.22–5.81)
RDW: 16.8 % — ABNORMAL HIGH (ref 11.5–15.5)
WBC: 22.2 10*3/uL — ABNORMAL HIGH (ref 4.0–10.5)

## 2018-04-21 LAB — BASIC METABOLIC PANEL
ANION GAP: 10 (ref 5–15)
BUN: 27 mg/dL — ABNORMAL HIGH (ref 8–23)
CO2: 24 mmol/L (ref 22–32)
Calcium: 9 mg/dL (ref 8.9–10.3)
Chloride: 105 mmol/L (ref 98–111)
Creatinine, Ser: 1.08 mg/dL (ref 0.61–1.24)
GFR calc Af Amer: >60 mL/min (ref >60)
GFR calc non Af Amer: >60 mL/min (ref >60)
Glucose, Bld: 184 mg/dL — ABNORMAL HIGH (ref 70–99)
POTASSIUM: 3.9 mmol/L (ref 3.5–5.1)
Sodium: 139 mmol/L (ref 135–145)

## 2018-04-21 LAB — MAGNESIUM: Magnesium: 2.2 mg/dL (ref 1.7–2.4)

## 2018-04-21 MED ORDER — SODIUM CHLORIDE 0.9% FLUSH
10.0000 mL | INTRAVENOUS | Status: DC | PRN
  Administered 2018-04-21: 10 mL
  Filled 2018-04-21: qty 40

## 2018-04-21 MED ORDER — HEPARIN SOD (PORK) LOCK FLUSH 100 UNIT/ML IV SOLN
250.0000 [IU] | INTRAVENOUS | Status: AC | PRN
  Administered 2018-04-21: 250 [IU]

## 2018-04-21 MED ORDER — LEVOFLOXACIN 500 MG PO TABS: 500.0000 mg | ORAL_TABLET | Freq: Every day | ORAL | 0 refills | Status: AC

## 2018-04-21 MED ORDER — ENSURE ENLIVE PO LIQD: 237.0000 mL | Freq: Two times a day (BID) | ORAL | 12 refills | Status: DC

## 2018-04-21 NOTE — Discharge Summary (Signed)
Physician Discharge Summary  Joseph Berg JKD:[id number] DOB: 12-30-1933 DOA: 04/19/2018  PCP: [name], MD  Admit date: 04/19/2018 Discharge date: 04/21/2018  Admitted From: Home.  Disposition: Home.   Recommendations for Outpatient Follow-up:  1. Follow up with PCP in 1-2 weeks 2. Please obtain BMP/CBC in one week 3. Please follow up with oncology as recommended.   Home Health:yes  Discharge Condition:stable.  CODE STATUS:DNR Diet recommendation: Heart Healthy   Brief/Interim Summary: Joseph Berg a 85 y.o.malewith medical history significant ofmetastatic small cell lung cancer, hypertension, COPD, chronic systolic heart failure completed 2 cycles of carboplatin and etoposide came in to oncology office for follow up , was found to be sob, febrile, hypotensive and was sent to ED for further evaluation and admission. on arrival he had low grade temp, hypoxic, tachypnea, hypotensive and tachycardic. Labs revealed leukocytosis, anemic with a hemoglobin of 6.6, BNP of 237, hypokalemic and hypomagnesemia of 1.5. influenza PCR is negative.  Blood cultures done and pending. CXR showed worsening left lower lobe pneumonia.   Discharge Diagnoses:  Active Problems:   HCAP (healthcare-associated pneumonia)  Acute on chronic respiratory failure with hypoxia secondary to left lower lobe pneumonia.  Treated him as  health care associated pneumonia, with broad spectrum IV antibiotics transitioned to oral antibiotics to complete the course.  Blood cultures ordered, urine for strep pneumonia antigen and legionella antigen ordered and negative Influenza PCR is negative. Respiratory panel is negative    COPD: Wheezing has resolved.    Hypokalemia and hypomagnesemia replaced.   CAD s/p CABG: Pt denies any chest pain.    Hyperlipidemia: resume crestor.    Hypertension:  Well controlled.    Anemia of chronic disease: Possibly from chemo.  No  hematochezia and hematemesis.  Anemia panel ordered.  2 units of prbc transfusion ordered.  Repeat H&H ordered post transfusion  Is around 9 Keep hemoglobin greater than 7.   Discharge Instructions  Discharge Instructions    Diet - low sodium heart healthy   Complete by:  As directed    Discharge instructions   Complete by:  As directed    Follow up with PCP in one week.  Follow up with oncology Dr Learta Codding as recommended.     Allergies as of 04/21/2018      Reactions   Hydrochlorothiazide Other (See Comments)   Affected renal function.  "affected kidneys and calcium level"      Medication List    STOP taking these medications   cilostazol 100 MG tablet Commonly known as:  PLETAL     TAKE these medications   AMBULATORY NON FORMULARY MEDICATION Nebulizer - use as needed - with supplies.  Diagnosis of COPD J44.1   AMBULATORY NON FORMULARY MEDICATION Medication Name: portable O2 concentrator, (Inogen). Dx:J44.1.  He is oxygen dependent and having to go to chemotherapy appointments multiple times a week. Fax to [facility]   calcium-vitamin D 500-200 MG-UNIT tablet Commonly known as:  Oscal 500/200 D-3 Take 1 tablet by mouth 2 (two) times daily. What changed:  when to take this   CARBOPLATIN IV Inject into the vein.   carvedilol 12.5 MG tablet Commonly known as:  COREG Take 1 tablet (12.5 mg total) by mouth 2 (two) times daily with a meal.   cholecalciferol 25 MCG (1000 UT) tablet Commonly known as:  VITAMIN D3 Take 1,000 Units by mouth daily.   ETOPOSIDE IV Inject into the vein.   feeding supplement (ENSURE ENLIVE) Liqd Take 237  mLs by mouth 2 (two) times daily between meals. Start taking on:  April 22, 2018   finasteride 5 MG tablet Commonly known as:  PROSCAR Take 1 tablet (5 mg total) by mouth every evening.   Fluticasone-Umeclidin-Vilant 100-62.5-25 MCG/INH Aepb Commonly known as:  Trelegy Ellipta Inhale 1 Inhaler into the lungs daily.    furosemide 40 MG tablet Commonly known as:  LASIX Take 1 tablet (40 mg total) by mouth daily.   ipratropium-albuterol 0.5-2.5 (3) MG/3ML Soln Commonly known as:  DUONEB Take 3 mLs by nebulization every 2 (two) hours as needed (sob).   levofloxacin 500 MG tablet Commonly known as:  Levaquin Take 1 tablet (500 mg total) by mouth daily for 4 days. Start taking on:  April 22, 2018   loperamide 2 MG tablet Commonly known as:  IMODIUM A-D Take 2 mg by mouth 4 (four) times daily as needed for diarrhea or loose stools.   multivitamin capsule Take 1 capsule by mouth daily.   Nebulizer Misc Generic nebulizer plus supplies   potassium chloride SA 20 MEQ tablet Commonly known as:  K-DUR,KLOR-CON Take 1 tablet (20 mEq total) by mouth daily.   prochlorperazine 5 MG tablet Commonly known as:  COMPAZINE Take 1 tablet (5 mg total) by mouth every 6 (six) hours as needed for nausea or vomiting.   rosuvastatin 40 MG tablet Commonly known as:  CRESTOR Take 1 tablet (40 mg total) by mouth daily.   senna-docusate 8.6-50 MG tablet Commonly known as:  Senokot-S Take 1 tablet by mouth at bedtime as needed for mild constipation.   TRUBIOTICS PO Take 1 tablet by mouth daily.   Ventolin HFA 108 (90 Base) MCG/ACT inhaler Generic drug:  albuterol TAKE 2 PUFFS BY MOUTH EVERY 6 HOURS AS NEEDED FOR WHEEZE OR SHORTNESS OF BREATH What changed:  See the new instructions.   VITAMIN B-12 PO Take 1 tablet by mouth daily.   vitamin C 250 MG tablet Commonly known as:  ASCORBIC ACID Take 750 mg by mouth daily.      Follow-up Information    [name], MD. Schedule an appointment as soon as possible for a visit in 1 week(s).   Specialty:  Family Medicine Contact information: [place] [address] [phone]        Lelon Perla, MD .   Specialty:  Cardiology Contact information: [address] [place]  [address] [phone]          Allergies  Allergen Reactions  . Hydrochlorothiazide Other (See Comments)    Affected renal function.  "affected kidneys and calcium level"    Consultations:  None.    Procedures/Studies: Dg Chest 2 View  Result Date: 04/17/2018 CLINICAL DATA:  Diarrhea and fever. Lung cancer. EXAM: CHEST - 2 VIEW COMPARISON:  03/14/2018 FINDINGS: Right PICC line in stable position. Postsurgical changes from CABG. Cardiomediastinal silhouette is normal. Mediastinal contours appear intact. Calcific atherosclerotic disease and tortuosity of the aorta. There is no evidence of pleural effusion or pneumothorax. Left lower lobe pulmonary mass versus airspace consolidation, stable. Osseous structures are without acute abnormality. Soft tissues are grossly normal. IMPRESSION: Stable left lower lobe pulmonary mass versus airspace consolidation. Electronically Signed   By: Fidela Salisbury M.D.   On: 04/17/2018 14:44   Dg Chest Port 1 View  Result Date: 04/19/2018 CLINICAL DATA:  Low-grade fever for the last 3 days. Shortness of breath. EXAM: PORTABLE CHEST 1 VIEW COMPARISON:  04/17/2018.  03/14/2018 FINDINGS: Right arm PICC tip in the SVC above the right atrium. The right lung remains clear. There is worsening pneumonia within the left lower lobe. Left upper lobe remains largely clear. IMPRESSION: Worsening of left lower lobe pneumonia. Electronically Signed   By: Nelson Chimes M.D.   On: 04/19/2018 16:46       Subjective: No chest pain or sob.   Discharge Exam: Vitals:   04/21/18 1307 04/21/18 1307  BP: 134/65 134/65  Pulse: 83 82  Resp: 16 16  Temp: (!) 97.5 F (36.4 C) (!) 97.5 F (36.4 C)  SpO2: 96% 94%   Vitals:   04/21/18 0500 04/21/18 0941 04/21/18 1307 04/21/18 1307  BP: 126/74  134/65 134/65  Pulse: 76  83 82  Resp: 16  16 16   Temp: 98.2 F (36.8 C)  (!) 97.5 F (36.4 C) (!) 97.5 F (36.4 C)  TempSrc: Oral  Oral Oral  SpO2: 93% 94% 96% 94%   Weight:      Height:        General: Pt is alert, awake, not in acute distress Cardiovascular: RRR, S1/S2 +, no rubs, no gallops Respiratory: CTA bilaterally, no wheezing, no rhonchi Abdominal: Soft, NT, ND, bowel sounds + Extremities: no edema, no cyanosis    The results of significant diagnostics from this hospitalization (including imaging, microbiology, ancillary and laboratory) are listed below for reference.     Microbiology: Recent Results (from the past 240 hour(s))  Culture, Blood     Status: None (Preliminary result)   Collection Time: 04/17/18  2:56 PM  Result Value Ref Range Status   Specimen Description BLOOD LEFT ARM  Final   Special Requests   Final    BOTTLES DRAWN AEROBIC AND ANAEROBIC Blood Culture results may not be optimal due to an excessive volume of blood received in culture bottles   Culture   Final    NO GROWTH 4 DAYS Performed at [facility], [place] [address], [place], [place] [address]    Report Status PENDING  Incomplete  Culture, Blood     Status: None (Preliminary result)   Collection Time: 04/19/18 12:50 PM  Result Value Ref Range Status   Specimen Description BLOOD RIGHT FOREARM  Final   Special Requests   Final    BOTTLES DRAWN AEROBIC AND ANAEROBIC Blood Culture adequate volume   Culture   Final    NO GROWTH 2 DAYS Performed at [facility], [place] [address], [place], [place] [address]    Report Status PENDING  Incomplete  Culture, Blood     Status: None (Preliminary result)   Collection Time: 04/19/18 12:55 PM  Result Value Ref Range Status   Specimen Description BLOOD PICC LINE  Final   Special Requests   Final    BOTTLES DRAWN AEROBIC AND ANAEROBIC Blood Culture adequate volume   Culture   Final    NO GROWTH 2 DAYS Performed at [facility], [place] [address] [place], [place] [address]    Report Status PENDING  Incomplete  Culture, blood (routine x 2)     Status: None (Preliminary result)   Collection Time:  04/19/18  4:30 PM  Result Value Ref Range Status   Specimen Description   Final    BLOOD LEFT WRIST Performed at [facility], [place] [address], [place], [place] [address]    Special Requests   Final    BOTTLES DRAWN AEROBIC AND ANAEROBIC Blood Culture adequate volume Performed  at [facility], [place] [address], [place], [place] [address]    Culture   Final    NO GROWTH 2 DAYS Performed at [facility] [address] [place], [place] [address]    Report Status PENDING  Incomplete  Culture, blood (routine x 2)     Status: None (Preliminary result)   Collection Time: 04/19/18  4:30 PM  Result Value Ref Range Status   Specimen Description   Final    BLOOD RIGHT PICC Performed at [facility] [address] [place], [place] [address]    Special Requests   Final    BOTTLES DRAWN AEROBIC AND ANAEROBIC Blood Culture adequate volume Performed at [facility] [address], [place], [place] [address]    Culture   Final    NO GROWTH 2 DAYS Performed at [facility] [address] [place], [place] [address]    Report Status PENDING  Incomplete  Respiratory Panel by PCR     Status: None   Collection Time: 04/19/18  7:32 PM  Result Value Ref Range Status   Adenovirus NOT DETECTED NOT DETECTED Final   Coronavirus 229E NOT DETECTED NOT DETECTED Final    Comment: (NOTE) The Coronavirus on the Respiratory Panel, DOES NOT test for the novel  Coronavirus (2019 nCoV)    Coronavirus HKU1 NOT DETECTED NOT DETECTED Final   Coronavirus NL63 NOT DETECTED NOT DETECTED Final   Coronavirus OC43 NOT DETECTED NOT DETECTED Final   Metapneumovirus NOT DETECTED NOT DETECTED Final   Rhinovirus / Enterovirus NOT DETECTED NOT DETECTED Final   Influenza A NOT DETECTED NOT DETECTED Final   Influenza B NOT DETECTED NOT DETECTED Final   Parainfluenza Virus 1 NOT DETECTED NOT DETECTED Final   Parainfluenza Virus 2 NOT DETECTED NOT  DETECTED Final   Parainfluenza Virus 3 NOT DETECTED NOT DETECTED Final   Parainfluenza Virus 4 NOT DETECTED NOT DETECTED Final   Respiratory Syncytial Virus NOT DETECTED NOT DETECTED Final   Bordetella pertussis NOT DETECTED NOT DETECTED Final   Chlamydophila pneumoniae NOT DETECTED NOT DETECTED Final   Mycoplasma pneumoniae NOT DETECTED NOT DETECTED Final    Comment: Performed at [facility], [address], [place], [place] [address]  MRSA PCR Screening     Status: None   Collection Time: 04/19/18  7:32 PM  Result Value Ref Range Status   MRSA by PCR NEGATIVE NEGATIVE Final    Comment:        The GeneXpert MRSA Assay (FDA approved for NASAL specimens only), is one component of a comprehensive MRSA colonization surveillance program. It is not intended to diagnose MRSA infection nor to guide or monitor treatment for MRSA infections. Performed at [facility], [place] [address], [place],  [address]      Labs: BNP (last 3 results) Recent Labs    01/11/18 0935 03/15/18 0511 04/19/18 1637  BNP 296.6* 575.4* 962.9*   Basic Metabolic Panel: Recent Labs  Lab 04/17/18 1456 04/19/18 1637 04/20/18 0410 04/21/18 0647  NA 139 138 139 139  K 4.0 3.3* 3.8 3.9  CL 100 103 105 105  CO2 28 26 24 24   GLUCOSE 115* 127* 192* 184*  BUN 13 17 17  27*  CREATININE 1.15 1.15 1.11 1.08  CALCIUM 9.6 8.6* 8.7* 9.0  MG 1.5* 1.5* 2.3 2.2   Liver Function Tests: Recent Labs  Lab 04/19/18 1637  AST 34  ALT 42  ALKPHOS  93  BILITOT 1.0  PROT 6.1*  ALBUMIN 3.1*   No results for input(s): LIPASE, AMYLASE in the last 168 hours. No results for input(s): AMMONIA in the last 168 hours. CBC: Recent Labs  Lab 04/17/18 1456 04/19/18 1208 04/19/18 1637 04/20/18 0410 04/21/18 0647  WBC 10.7* 16.9* 18.1* 17.9* 22.2*  NEUTROABS 7.3 13.4* 14.3* 15.0*  --   HGB 7.4* 7.0* 6.6* 9.0* 8.5*  HCT 23.6* 21.9* 21.5* 28.5* 25.7*  MCV 94.4 94.4 96.0 95.0 93.1  PLT 66*  143* 157 150 204   Cardiac Enzymes: No results for input(s): CKTOTAL, CKMB, CKMBINDEX, TROPONINI in the last 168 hours. BNP: Invalid input(s): POCBNP CBG: No results for input(s): GLUCAP in the last 168 hours. D-Dimer No results for input(s): DDIMER in the last 72 hours. Hgb A1c No results for input(s): HGBA1C in the last 72 hours. Lipid Profile No results for input(s): CHOL, HDL, LDLCALC, TRIG, CHOLHDL, LDLDIRECT in the last 72 hours. Thyroid function studies No results for input(s): TSH, T4TOTAL, T3FREE, THYROIDAB in the last 72 hours.  Invalid input(s): FREET3 Anemia work up No results for input(s): VITAMINB12, FOLATE, FERRITIN, TIBC, IRON, RETICCTPCT in the last 72 hours. Urinalysis    Component Value Date/Time   COLORURINE YELLOW 04/20/2018 0611   APPEARANCEUR CLEAR 04/20/2018 0611   LABSPEC 1.025 04/20/2018 0611   PHURINE 5.0 04/20/2018 0611   GLUCOSEU NEGATIVE 04/20/2018 0611   HGBUR NEGATIVE 04/20/2018 0611   BILIRUBINUR NEGATIVE 04/20/2018 0611   BILIRUBINUR neg 10/31/2011 1121   KETONESUR NEGATIVE 04/20/2018 0611   PROTEINUR NEGATIVE 04/20/2018 0611   UROBILINOGEN 2.0 (H) 08/09/2012 1316   NITRITE NEGATIVE 04/20/2018 0611   LEUKOCYTESUR NEGATIVE 04/20/2018 3419   Sepsis Labs Invalid input(s): PROCALCITONIN,  WBC,  LACTICIDVEN Microbiology Recent Results (from the past 240 hour(s))  Culture, Blood     Status: None (Preliminary result)   Collection Time: 04/17/18  2:56 PM  Result Value Ref Range Status   Specimen Description BLOOD LEFT ARM  Final   Special Requests   Final    BOTTLES DRAWN AEROBIC AND ANAEROBIC Blood Culture results may not be optimal due to an excessive volume of blood received in culture bottles   Culture   Final    NO GROWTH 4 DAYS Performed at [facility], [place] [address], [place], [place] [address]    Report Status PENDING  Incomplete  Culture, Blood     Status: None (Preliminary result)   Collection Time: 04/19/18 12:50 PM   Result Value Ref Range Status   Specimen Description BLOOD RIGHT FOREARM  Final   Special Requests   Final    BOTTLES DRAWN AEROBIC AND ANAEROBIC Blood Culture adequate volume   Culture   Final    NO GROWTH 2 DAYS Performed at [facility], [place] [address] [place], [place] [address]    Report Status PENDING  Incomplete  Culture, Blood     Status: None (Preliminary result)   Collection Time: 04/19/18 12:55 PM  Result Value Ref Range Status   Specimen Description BLOOD PICC LINE  Final   Special Requests   Final    BOTTLES DRAWN AEROBIC AND ANAEROBIC Blood Culture adequate volume   Culture   Final    NO GROWTH 2 DAYS Performed at [facility], [place] [address] [place], [place] [address]    Report Status PENDING  Incomplete  Culture, blood (routine x 2)     Status: None (Preliminary result)   Collection Time:  04/19/18  4:30 PM  Result Value Ref Range Status   Specimen Description   Final    BLOOD LEFT WRIST Performed at [facility] [address], [place], [place] [address]    Special Requests   Final    BOTTLES DRAWN AEROBIC AND ANAEROBIC Blood Culture adequate volume Performed at [facility] [address] [place], [place] [address]    Culture   Final    NO GROWTH 2 DAYS Performed at [facility] [address], [place], [place] [address]    Report Status PENDING  Incomplete  Culture, blood (routine x 2)     Status: None (Preliminary result)   Collection Time: 04/19/18  4:30 PM  Result Value Ref Range Status   Specimen Description   Final    BLOOD RIGHT PICC Performed at [facility] [address] [place], [place] [address]    Special Requests   Final    BOTTLES DRAWN AEROBIC AND ANAEROBIC Blood Culture adequate volume Performed at [facility] [address], [place], [place] [address]    Culture   Final    NO GROWTH 2 DAYS Performed at [facility] [address],  [place], [place] [address]    Report Status PENDING  Incomplete  Respiratory Panel by PCR     Status: None   Collection Time: 04/19/18  7:32 PM  Result Value Ref Range Status   Adenovirus NOT DETECTED NOT DETECTED Final   Coronavirus 229E NOT DETECTED NOT DETECTED Final    Comment: (NOTE) The Coronavirus on the Respiratory Panel, DOES NOT test for the novel  Coronavirus (2019 nCoV)    Coronavirus HKU1 NOT DETECTED NOT DETECTED Final   Coronavirus NL63 NOT DETECTED NOT DETECTED Final   Coronavirus OC43 NOT DETECTED NOT DETECTED Final   Metapneumovirus NOT DETECTED NOT DETECTED Final   Rhinovirus / Enterovirus NOT DETECTED NOT DETECTED Final   Influenza A NOT DETECTED NOT DETECTED Final   Influenza B NOT DETECTED NOT DETECTED Final   Parainfluenza Virus 1 NOT DETECTED NOT DETECTED Final   Parainfluenza Virus 2 NOT DETECTED NOT DETECTED Final   Parainfluenza Virus 3 NOT DETECTED NOT DETECTED Final   Parainfluenza Virus 4 NOT DETECTED NOT DETECTED Final   Respiratory Syncytial Virus NOT DETECTED NOT DETECTED Final   Bordetella pertussis NOT DETECTED NOT DETECTED Final   Chlamydophila pneumoniae NOT DETECTED NOT DETECTED Final   Mycoplasma pneumoniae NOT DETECTED NOT DETECTED Final    Comment: Performed at [facility], [address] [place], [place] [address]  MRSA PCR Screening     Status: None   Collection Time: 04/19/18  7:32 PM  Result Value Ref Range Status   MRSA by PCR NEGATIVE NEGATIVE Final    Comment:        The GeneXpert MRSA Assay (FDA approved for NASAL specimens only), is one component of a comprehensive MRSA colonization surveillance program. It is not intended to diagnose MRSA infection nor to guide or monitor treatment for MRSA infections. Performed at [facility], [place] [address], [place], [place] [address]      Time coordinating discharge: 32 minutes  SIGNED:   Hosie Poisson, MD  [facility] 04/21/2018, 7:11 PM Pager    If 7PM-7AM, please contact night-coverage [email or web address] Password [email or web address]

## 2018-04-21 NOTE — Progress Notes (Signed)
Patient to discharge with PICC line. Family states [facility] is coming to house to remove PICC line on Monday. Patient's primary nurse confirmed with Dr. Karleen Hampshire to discharge with PICC line.

## 2018-04-22 LAB — CULTURE, BLOOD (SINGLE): Culture: NO GROWTH

## 2018-04-22 LAB — LEGIONELLA PNEUMOPHILA SEROGP 1 UR AG: L. pneumophila Serogp 1 Ur Ag: NEGATIVE

## 2018-04-22 NOTE — Progress Notes (Signed)
These preliminary result these preliminary results were noted.  Awaiting final report.

## 2018-04-23 ENCOUNTER — Encounter (HOSPITAL_COMMUNITY): Payer: Self-pay

## 2018-04-23 ENCOUNTER — Ambulatory Visit (HOSPITAL_COMMUNITY)
Admission: RE | Admit: 2018-04-23 | Discharge: 2018-04-23 | Disposition: A | Discharge status: Home / Self Care | Payer: Medicare PPO | Source: Ambulatory Visit | Attending: Oncology | Admitting: Oncology

## 2018-04-23 ENCOUNTER — Other Ambulatory Visit: Payer: Self-pay

## 2018-04-23 ENCOUNTER — Telehealth: Payer: Self-pay | Admitting: Oncology

## 2018-04-23 ENCOUNTER — Telehealth: Payer: Self-pay | Admitting: *Deleted

## 2018-04-23 DIAGNOSIS — I5022 Chronic systolic (congestive) heart failure: Secondary | ICD-10-CM | POA: Insufficient documentation

## 2018-04-23 DIAGNOSIS — J449 Chronic obstructive pulmonary disease, unspecified: Secondary | ICD-10-CM | POA: Diagnosis not present

## 2018-04-23 MED ORDER — HEPARIN SOD (PORK) LOCK FLUSH 100 UNIT/ML IV SOLN
INTRAVENOUS | Status: AC
  Filled 2018-04-23: qty 5

## 2018-04-23 MED ORDER — HEPARIN SOD (PORK) LOCK FLUSH 100 UNIT/ML IV SOLN
250.0000 [IU] | INTRAVENOUS | Status: AC | PRN
  Administered 2018-04-23 (×2): 250 [IU]

## 2018-04-23 NOTE — Telephone Encounter (Signed)
Added picc flush appointments 3/18 and 3/20 per 3/16 schedule message. No orders per 3/12 los.

## 2018-04-23 NOTE — Progress Notes (Addendum)
Patient in for procedure today- Port placement and removal of PICC line.   Rowe Robert PA into see pt.   Pt recently had pneumonia/ on antibiotics- was hospitalized and was discharged on Sat. 04/19/2018.  PA Confirmed with Dr. Benay Spice- Cancelling port placement today.  Office will contact patient to reschedule [facility].   Will keep PICC until this date. Pt. Discharged via wheelchair to car- VSS/ wife aware of cancellation today.

## 2018-04-23 NOTE — Progress Notes (Signed)
Patient ID: Joseph Berg, male   DOB: 10-Jul-1933, 85 y.o.   MRN: [id number] Patient presented to short stay center today in anticipation of having Port-A-Cath placed.  He was recently discharged from the hospital last weekend with pneumonia.  He is currently on antibiotic therapy.  Fndings discussed with Dr. Benay Spice and decision made to postpone port placement at this time.  Their office will contact patient to reschedule port placement.  Patient does have a right upper extremity PICC in place which should remain for now.

## 2018-04-23 NOTE — Telephone Encounter (Signed)
Port placement canceled today since he is still on antibiotic. Patient wants to delay port until after the next cycle of chemo is completed. Wife reports his PICC was flushed today. Will send scheduling message for PICC flush on 3/18 and 3/20.

## 2018-04-24 LAB — CULTURE, BLOOD (SINGLE)
Culture: NO GROWTH
Culture: NO GROWTH
Special Requests: ADEQUATE
Special Requests: ADEQUATE

## 2018-04-24 LAB — CULTURE, BLOOD (ROUTINE X 2)
Culture: NO GROWTH
Culture: NO GROWTH
Special Requests: ADEQUATE
Special Requests: ADEQUATE

## 2018-04-24 NOTE — Progress Notes (Signed)
These preliminary result these preliminary results were noted.  Awaiting final report.

## 2018-04-25 ENCOUNTER — Other Ambulatory Visit: Payer: Self-pay | Admitting: Oncology

## 2018-04-25 ENCOUNTER — Other Ambulatory Visit: Payer: Self-pay

## 2018-04-25 ENCOUNTER — Inpatient Hospital Stay: Payer: Medicare PPO

## 2018-04-25 DIAGNOSIS — Z5111 Encounter for antineoplastic chemotherapy: Secondary | ICD-10-CM | POA: Diagnosis not present

## 2018-04-25 DIAGNOSIS — R509 Fever, unspecified: Secondary | ICD-10-CM | POA: Diagnosis not present

## 2018-04-25 DIAGNOSIS — Z452 Encounter for adjustment and management of vascular access device: Secondary | ICD-10-CM | POA: Diagnosis not present

## 2018-04-25 DIAGNOSIS — C3432 Malignant neoplasm of lower lobe, left bronchus or lung: Secondary | ICD-10-CM | POA: Diagnosis not present

## 2018-04-25 DIAGNOSIS — C7952 Secondary malignant neoplasm of bone marrow: Secondary | ICD-10-CM | POA: Diagnosis not present

## 2018-04-25 DIAGNOSIS — D6189 Other specified aplastic anemias and other bone marrow failure syndromes: Secondary | ICD-10-CM | POA: Diagnosis not present

## 2018-04-25 DIAGNOSIS — D6959 Other secondary thrombocytopenia: Secondary | ICD-10-CM | POA: Diagnosis not present

## 2018-04-25 DIAGNOSIS — R197 Diarrhea, unspecified: Secondary | ICD-10-CM | POA: Diagnosis not present

## 2018-04-25 DIAGNOSIS — D6481 Anemia due to antineoplastic chemotherapy: Secondary | ICD-10-CM | POA: Diagnosis not present

## 2018-04-25 DIAGNOSIS — Z95828 Presence of other vascular implants and grafts: Secondary | ICD-10-CM

## 2018-04-25 MED ORDER — SODIUM CHLORIDE 0.9% FLUSH
10.0000 mL | INTRAVENOUS | Status: DC | PRN
  Administered 2018-04-25: 10 mL via INTRAVENOUS
  Filled 2018-04-25: qty 10

## 2018-04-25 MED ORDER — HEPARIN SOD (PORK) LOCK FLUSH 100 UNIT/ML IV SOLN
500.0000 [IU] | Freq: Once | INTRAVENOUS | Status: AC
  Administered 2018-04-25: 250 [IU] via INTRAVENOUS
  Filled 2018-04-25: qty 5

## 2018-04-26 DIAGNOSIS — R1311 Dysphagia, oral phase: Secondary | ICD-10-CM | POA: Diagnosis not present

## 2018-04-26 DIAGNOSIS — I2510 Atherosclerotic heart disease of native coronary artery without angina pectoris: Secondary | ICD-10-CM | POA: Diagnosis not present

## 2018-04-26 DIAGNOSIS — M47815 Spondylosis without myelopathy or radiculopathy, thoracolumbar region: Secondary | ICD-10-CM | POA: Diagnosis not present

## 2018-04-26 DIAGNOSIS — C7951 Secondary malignant neoplasm of bone: Secondary | ICD-10-CM | POA: Diagnosis not present

## 2018-04-26 DIAGNOSIS — C3490 Malignant neoplasm of unspecified part of unspecified bronchus or lung: Secondary | ICD-10-CM | POA: Diagnosis not present

## 2018-04-26 DIAGNOSIS — I110 Hypertensive heart disease with heart failure: Secondary | ICD-10-CM | POA: Diagnosis not present

## 2018-04-26 DIAGNOSIS — J9691 Respiratory failure, unspecified with hypoxia: Secondary | ICD-10-CM | POA: Diagnosis not present

## 2018-04-26 DIAGNOSIS — I5022 Chronic systolic (congestive) heart failure: Secondary | ICD-10-CM | POA: Diagnosis not present

## 2018-04-26 DIAGNOSIS — J439 Emphysema, unspecified: Secondary | ICD-10-CM | POA: Diagnosis not present

## 2018-04-27 ENCOUNTER — Inpatient Hospital Stay: Payer: Medicare PPO

## 2018-04-27 ENCOUNTER — Other Ambulatory Visit: Payer: Self-pay

## 2018-04-27 DIAGNOSIS — Z95828 Presence of other vascular implants and grafts: Secondary | ICD-10-CM | POA: Insufficient documentation

## 2018-04-27 DIAGNOSIS — Z452 Encounter for adjustment and management of vascular access device: Secondary | ICD-10-CM

## 2018-04-27 DIAGNOSIS — D6189 Other specified aplastic anemias and other bone marrow failure syndromes: Secondary | ICD-10-CM | POA: Diagnosis not present

## 2018-04-27 DIAGNOSIS — D6481 Anemia due to antineoplastic chemotherapy: Secondary | ICD-10-CM | POA: Diagnosis not present

## 2018-04-27 DIAGNOSIS — Z5111 Encounter for antineoplastic chemotherapy: Secondary | ICD-10-CM | POA: Diagnosis not present

## 2018-04-27 DIAGNOSIS — C3432 Malignant neoplasm of lower lobe, left bronchus or lung: Secondary | ICD-10-CM | POA: Diagnosis not present

## 2018-04-27 DIAGNOSIS — C7952 Secondary malignant neoplasm of bone marrow: Secondary | ICD-10-CM | POA: Diagnosis not present

## 2018-04-27 DIAGNOSIS — R197 Diarrhea, unspecified: Secondary | ICD-10-CM | POA: Diagnosis not present

## 2018-04-27 DIAGNOSIS — D6959 Other secondary thrombocytopenia: Secondary | ICD-10-CM | POA: Diagnosis not present

## 2018-04-27 MED ORDER — HEPARIN SOD (PORK) LOCK FLUSH 100 UNIT/ML IV SOLN
500.0000 [IU] | Freq: Once | INTRAVENOUS | Status: AC
  Administered 2018-04-27: 500 [IU] via INTRAVENOUS
  Filled 2018-04-27: qty 5

## 2018-04-27 MED ORDER — SODIUM CHLORIDE 0.9% FLUSH
10.0000 mL | INTRAVENOUS | Status: DC | PRN
  Administered 2018-04-27: 10 mL via INTRAVENOUS
  Filled 2018-04-27: qty 10

## 2018-04-27 MED ORDER — SODIUM CHLORIDE 0.9% FLUSH
10.0000 mL | INTRAVENOUS | Status: DC | PRN
  Filled 2018-04-27: qty 10

## 2018-04-27 MED ORDER — HEPARIN SOD (PORK) LOCK FLUSH 100 UNIT/ML IV SOLN
500.0000 [IU] | Freq: Once | INTRAVENOUS | Status: DC | PRN
  Filled 2018-04-27: qty 5

## 2018-04-28 DIAGNOSIS — J439 Emphysema, unspecified: Secondary | ICD-10-CM | POA: Diagnosis not present

## 2018-04-28 DIAGNOSIS — I5022 Chronic systolic (congestive) heart failure: Secondary | ICD-10-CM | POA: Diagnosis not present

## 2018-04-28 DIAGNOSIS — J449 Chronic obstructive pulmonary disease, unspecified: Secondary | ICD-10-CM | POA: Diagnosis not present

## 2018-04-28 DIAGNOSIS — C3490 Malignant neoplasm of unspecified part of unspecified bronchus or lung: Secondary | ICD-10-CM | POA: Diagnosis not present

## 2018-04-29 ENCOUNTER — Other Ambulatory Visit: Payer: Self-pay | Admitting: Oncology

## 2018-04-30 ENCOUNTER — Inpatient Hospital Stay: Payer: Medicare PPO

## 2018-04-30 ENCOUNTER — Other Ambulatory Visit: Payer: Self-pay

## 2018-04-30 ENCOUNTER — Inpatient Hospital Stay (HOSPITAL_BASED_OUTPATIENT_CLINIC_OR_DEPARTMENT_OTHER): Payer: Medicare PPO | Admitting: Oncology

## 2018-04-30 VITALS — BP 138/80 | HR 71 | Temp 97.8°F | Resp 17 | Ht 69.0 in | Wt 201.1 lb

## 2018-04-30 DIAGNOSIS — I110 Hypertensive heart disease with heart failure: Secondary | ICD-10-CM | POA: Diagnosis not present

## 2018-04-30 DIAGNOSIS — C3490 Malignant neoplasm of unspecified part of unspecified bronchus or lung: Secondary | ICD-10-CM

## 2018-04-30 DIAGNOSIS — R197 Diarrhea, unspecified: Secondary | ICD-10-CM | POA: Diagnosis not present

## 2018-04-30 DIAGNOSIS — I509 Heart failure, unspecified: Secondary | ICD-10-CM

## 2018-04-30 DIAGNOSIS — I5022 Chronic systolic (congestive) heart failure: Secondary | ICD-10-CM | POA: Diagnosis not present

## 2018-04-30 DIAGNOSIS — E876 Hypokalemia: Secondary | ICD-10-CM | POA: Diagnosis not present

## 2018-04-30 DIAGNOSIS — C3432 Malignant neoplasm of lower lobe, left bronchus or lung: Secondary | ICD-10-CM

## 2018-04-30 DIAGNOSIS — N289 Disorder of kidney and ureter, unspecified: Secondary | ICD-10-CM

## 2018-04-30 DIAGNOSIS — R1311 Dysphagia, oral phase: Secondary | ICD-10-CM | POA: Diagnosis not present

## 2018-04-30 DIAGNOSIS — J9691 Respiratory failure, unspecified with hypoxia: Secondary | ICD-10-CM | POA: Diagnosis not present

## 2018-04-30 DIAGNOSIS — C801 Malignant (primary) neoplasm, unspecified: Secondary | ICD-10-CM

## 2018-04-30 DIAGNOSIS — M47815 Spondylosis without myelopathy or radiculopathy, thoracolumbar region: Secondary | ICD-10-CM | POA: Diagnosis not present

## 2018-04-30 DIAGNOSIS — C7952 Secondary malignant neoplasm of bone marrow: Secondary | ICD-10-CM | POA: Diagnosis not present

## 2018-04-30 DIAGNOSIS — I2510 Atherosclerotic heart disease of native coronary artery without angina pectoris: Secondary | ICD-10-CM

## 2018-04-30 DIAGNOSIS — J449 Chronic obstructive pulmonary disease, unspecified: Secondary | ICD-10-CM

## 2018-04-30 DIAGNOSIS — D6959 Other secondary thrombocytopenia: Secondary | ICD-10-CM | POA: Diagnosis not present

## 2018-04-30 DIAGNOSIS — Z5111 Encounter for antineoplastic chemotherapy: Secondary | ICD-10-CM | POA: Diagnosis not present

## 2018-04-30 DIAGNOSIS — D701 Agranulocytosis secondary to cancer chemotherapy: Secondary | ICD-10-CM | POA: Diagnosis not present

## 2018-04-30 DIAGNOSIS — D6189 Other specified aplastic anemias and other bone marrow failure syndromes: Secondary | ICD-10-CM | POA: Diagnosis not present

## 2018-04-30 DIAGNOSIS — Z95828 Presence of other vascular implants and grafts: Secondary | ICD-10-CM

## 2018-04-30 DIAGNOSIS — D6481 Anemia due to antineoplastic chemotherapy: Secondary | ICD-10-CM | POA: Diagnosis not present

## 2018-04-30 DIAGNOSIS — C7951 Secondary malignant neoplasm of bone: Secondary | ICD-10-CM | POA: Diagnosis not present

## 2018-04-30 DIAGNOSIS — Z452 Encounter for adjustment and management of vascular access device: Secondary | ICD-10-CM | POA: Diagnosis not present

## 2018-04-30 DIAGNOSIS — J439 Emphysema, unspecified: Secondary | ICD-10-CM | POA: Diagnosis not present

## 2018-04-30 LAB — CMP (CANCER CENTER ONLY)
ALT: 57 U/L — ABNORMAL HIGH (ref 0–44)
AST: 32 U/L (ref 15–41)
Albumin: 3 g/dL — ABNORMAL LOW (ref 3.5–5.0)
Alkaline Phosphatase: 68 U/L (ref 38–126)
Anion gap: 11 (ref 5–15)
BUN: 13 mg/dL (ref 8–23)
CO2: 29 mmol/L (ref 22–32)
Calcium: 9.8 mg/dL (ref 8.9–10.3)
Chloride: 102 mmol/L (ref 98–111)
Creatinine: 0.92 mg/dL (ref 0.61–1.24)
GFR, Est AFR Am: >60 mL/min (ref >60)
GFR, Estimated: >60 mL/min (ref >60)
GLUCOSE: 109 mg/dL — AB (ref 70–99)
Potassium: 4.5 mmol/L (ref 3.5–5.1)
Sodium: 142 mmol/L (ref 135–145)
Total Bilirubin: 0.8 mg/dL (ref 0.3–1.2)
Total Protein: 6.3 g/dL — ABNORMAL LOW (ref 6.5–8.1)

## 2018-04-30 LAB — CBC WITH DIFFERENTIAL (CANCER CENTER ONLY)
Abs Immature Granulocytes: 0.1 10*3/uL — ABNORMAL HIGH (ref 0.00–0.07)
Basophils Absolute: 0 10*3/uL (ref 0.0–0.1)
Basophils Relative: 0 %
Eosinophils Absolute: 0 10*3/uL (ref 0.0–0.5)
Eosinophils Relative: 0 %
HCT: 31.9 % — ABNORMAL LOW (ref 39.0–52.0)
Hemoglobin: 10.3 g/dL — ABNORMAL LOW (ref 13.0–17.0)
Immature Granulocytes: 1 %
Lymphocytes Relative: 14 %
Lymphs Abs: 1.3 10*3/uL (ref 0.7–4.0)
MCH: 30.3 pg (ref 26.0–34.0)
MCHC: 32.3 g/dL (ref 30.0–36.0)
MCV: 93.8 fL (ref 80.0–100.0)
MONO ABS: 1.4 10*3/uL — AB (ref 0.1–1.0)
MONOS PCT: 15 %
NEUTROS PCT: 70 %
Neutro Abs: 6.6 10*3/uL (ref 1.7–7.7)
Platelet Count: 200 10*3/uL (ref 150–400)
RBC: 3.4 MIL/uL — ABNORMAL LOW (ref 4.22–5.81)
RDW: 16.2 % — ABNORMAL HIGH (ref 11.5–15.5)
WBC Count: 9.5 10*3/uL (ref 4.0–10.5)
nRBC: 0 % (ref 0.0–0.2)

## 2018-04-30 MED ORDER — DEXAMETHASONE SODIUM PHOSPHATE 10 MG/ML IJ SOLN
10.0000 mg | Freq: Once | INTRAMUSCULAR | Status: AC
  Administered 2018-04-30: 10 mg via INTRAVENOUS

## 2018-04-30 MED ORDER — DEXAMETHASONE SODIUM PHOSPHATE 10 MG/ML IJ SOLN
INTRAMUSCULAR | Status: AC
  Filled 2018-04-30: qty 1

## 2018-04-30 MED ORDER — SODIUM CHLORIDE 0.9% FLUSH
10.0000 mL | INTRAVENOUS | Status: DC | PRN
  Administered 2018-04-30: 10 mL
  Filled 2018-04-30: qty 10

## 2018-04-30 MED ORDER — SODIUM CHLORIDE 0.9 % IV SOLN
1200.0000 mg | Freq: Once | INTRAVENOUS | Status: AC
  Administered 2018-04-30: 1200 mg via INTRAVENOUS
  Filled 2018-04-30: qty 20

## 2018-04-30 MED ORDER — SODIUM CHLORIDE 0.9 % IV SOLN
80.0000 mg/m2 | Freq: Once | INTRAVENOUS | Status: AC
  Administered 2018-04-30: 170 mg via INTRAVENOUS
  Filled 2018-04-30: qty 8.5

## 2018-04-30 MED ORDER — PALONOSETRON HCL INJECTION 0.25 MG/5ML
0.2500 mg | Freq: Once | INTRAVENOUS | Status: AC
  Administered 2018-04-30: 0.25 mg via INTRAVENOUS

## 2018-04-30 MED ORDER — PALONOSETRON HCL INJECTION 0.25 MG/5ML
INTRAVENOUS | Status: AC
  Filled 2018-04-30: qty 5

## 2018-04-30 MED ORDER — SODIUM CHLORIDE 0.9 % IV SOLN
410.0000 mg | Freq: Once | INTRAVENOUS | Status: AC
  Administered 2018-04-30: 410 mg via INTRAVENOUS
  Filled 2018-04-30: qty 41

## 2018-04-30 MED ORDER — SODIUM CHLORIDE 0.9 % IV SOLN
Freq: Once | INTRAVENOUS | Status: AC
  Administered 2018-04-30: 11:00:00 via INTRAVENOUS
  Filled 2018-04-30: qty 250

## 2018-04-30 MED ORDER — HEPARIN SOD (PORK) LOCK FLUSH 100 UNIT/ML IV SOLN
500.0000 [IU] | Freq: Once | INTRAVENOUS | Status: AC | PRN
  Administered 2018-04-30: 500 [IU]
  Filled 2018-04-30: qty 5

## 2018-04-30 NOTE — Patient Instructions (Addendum)
Coronavirus (COVID-19) Are you at risk?  Are you at risk for the Coronavirus (COVID-19)?  To be considered HIGH RISK for Coronavirus (COVID-19), you have to meet the following criteria:  . Traveled to [place], [place], [place], [place] or [place]; or in the [place] to [place], [place], [place], or [place]; and have fever, cough, and shortness of breath within the last 2 weeks of travel OR . Been in close contact with a person diagnosed with COVID-19 within the last 2 weeks and have fever, cough, and shortness of breath . IF YOU DO NOT MEET THESE CRITERIA, YOU ARE CONSIDERED LOW RISK FOR COVID-19.  What to do if you are HIGH RISK for COVID-19?  . If you are having a medical emergency, call 911. . Seek medical care right away. Before you go to a doctor's office, urgent care or emergency department, call ahead and tell them about your recent travel, contact with someone diagnosed with COVID-19, and your symptoms. You should receive instructions from your physician's office regarding next steps of care.  . When you arrive at healthcare provider, tell the healthcare staff immediately you have returned from visiting [place], [place], [place], [place] or [place]; or traveled in the [place] to [place], [place], [place], or [place]; in the last two weeks or you have been in close contact with a person diagnosed with COVID-19 in the last 2 weeks.   . Tell the health care staff about your symptoms: fever, cough and shortness of breath. . After you have been seen by a medical provider, you will be either: o Tested for (COVID-19) and discharged home on quarantine except to seek medical care if symptoms worsen, and asked to  - Stay home and avoid contact with others until you get your results (4-5 days)  - Avoid travel on public transportation if possible (such as bus, train, or airplane) or o Sent to the Emergency Department by EMS for evaluation, COVID-19 testing, and possible  admission depending on your condition and test results.  What to do if you are LOW RISK for COVID-19?  Reduce your risk of any infection by using the same precautions used for avoiding the common cold or flu:  . Wash your hands often with soap and warm water for at least 20 seconds.  If soap and water are not readily available, use an alcohol-based hand sanitizer with at least 60% alcohol.  . If coughing or sneezing, cover your mouth and nose by coughing or sneezing into the elbow areas of your shirt or coat, into a tissue or into your sleeve (not your hands). . Avoid shaking hands with others and consider head nods or verbal greetings only. . Avoid touching your eyes, nose, or mouth with unwashed hands.  . Avoid close contact with people who are sick. . Avoid places or events with large numbers of people in one location, like concerts or sporting events. . Carefully consider travel plans you have or are making. . If you are planning any travel outside or inside the [place], visit the CDC's Travelers' Health webpage for the latest health notices. . If you have some symptoms but not all symptoms, continue to monitor at home and seek medical attention if your symptoms worsen. . If you are having a medical emergency, call 911.  ADDITIONAL HEALTHCARE OPTIONS FOR PATIENTS  [facility] Tele[facility] / e-Visit: [email or web address]         [facility]: [phone]  [facility]: [facility]: [place] Discharge Instructions for Patients Receiving Chemotherapy  Today you received the following chemotherapy agents Carboplatin (Paraplatin) and Etoposide (Vepesid, VP-16)  To help prevent nausea and vomiting after your treatment, we encourage you to take your nausea medication as directed.   If you develop nausea and vomiting that is not controlled by your nausea  medication, call the clinic.   BELOW ARE SYMPTOMS THAT SHOULD BE REPORTED IMMEDIATELY:  *FEVER GREATER THAN 100.5 F  *CHILLS WITH OR WITHOUT FEVER  NAUSEA AND VOMITING THAT IS NOT CONTROLLED WITH YOUR NAUSEA MEDICATION  *UNUSUAL SHORTNESS OF BREATH  *UNUSUAL BRUISING OR BLEEDING  TENDERNESS IN MOUTH AND THROAT WITH OR WITHOUT PRESENCE OF ULCERS  *URINARY PROBLEMS  *BOWEL PROBLEMS  UNUSUAL RASH Items with * indicate a potential emergency and should be followed up as soon as possible.  Feel free to call the clinic should you have any questions or concerns. The clinic phone number is [phone].  Please show the [facility] at check-in to the Emergency Department and triage nurse.

## 2018-04-30 NOTE — Progress Notes (Signed)
[facility] OFFICE PROGRESS NOTE   Diagnosis: Small cell lung cancer  INTERVAL HISTORY:   Joseph Berg completed another cycle of etoposide/carboplatin beginning 04/03/2018.  He received Udenyca on 04/07/2018.  He reports tolerating the treatment well.  He was admitted with left lung pneumonia on 04/19/2018. Joseph Berg was discharged from the hospital 04/21/2018.  He reports feeling well.  No dyspnea.  Good appetite.  Objective:  Vital signs in last 24 hours:  Blood pressure 138/80, pulse 71, temperature 97.8 F (36.6 C), temperature source Oral, resp. rate 17, height '5\' 9"'$  (1.753 m), weight 201 lb 1.6 oz (91.2 kg), SpO2 95 %.    Physical examination: Not performed today  Portacath/PICC-without erythema  Lab Results:  Lab Results  Component Value Date   WBC 9.5 04/30/2018   HGB 10.3 (L) 04/30/2018   HCT 31.9 (L) 04/30/2018   MCV 93.8 04/30/2018   PLT 200 04/30/2018   NEUTROABS 6.6 04/30/2018    CMP  Lab Results  Component Value Date   NA 142 04/30/2018   K 4.5 04/30/2018   CL 102 04/30/2018   CO2 29 04/30/2018   GLUCOSE 109 (H) 04/30/2018   BUN 13 04/30/2018   CREATININE 0.92 04/30/2018   CALCIUM 9.8 04/30/2018   PROT 6.3 (L) 04/30/2018   ALBUMIN 3.0 (L) 04/30/2018   AST 32 04/30/2018   ALT 57 (H) 04/30/2018   ALKPHOS 68 04/30/2018   BILITOT 0.8 04/30/2018   GFRNONAA >60 04/30/2018   GFRAA >60 04/30/2018     Medications: I have reviewed the patient's current medications.   Assessment/Plan: 1.  Extensive stage small cell lung cancer  CT chest 03/05/2018-left hilar mass, small mediastinal lymph nodes, atelectasis versus consolidation versus mass in the superior segment of the left lower lobe  Bone marrow biopsy 03/07/2018-extensive involvement of the bone marrow with metastatic small cell carcinoma consistent with a lung primary, small foci of non-small cell differentiation  Cycle 1 etoposide/carboplatin 03/09/2018, G-CSF started 03/12/2018  and discontinued 03/20/2018  CT head 03/21/2018-no evidence of metastatic disease  Cycle 2 etoposide/carboplatin, dose escalation of etoposide and carboplatin 04/03/2018  Cycle 3 etoposide/carboplatin with addition of atezolizumab 04/30/2018 2.  Thrombocytopenia secondary to #1-improved 3.COPD 4.History of coronary artery disease 5.CHF 6.BPH 7.Admission with pneumonia December 2019 8.Mild renal insufficiency-improved 9.Elevated liver enzymes 10.  Coagulopathy 11.  History of mild elevation of the calcium level 12.  Anemia/leukopenia secondary to small cell lung cancer and chemotherapy-improved 13.  Hypokalemia-likely secondary to furosemide therapy, potassium supplement started 04/03/2018 14.  Admission 04/19/2018 with left lung pneumonia    Disposition: Joseph Berg appears well.  He has completed 2 cycles of chemotherapy for treatment of extensive stage small cell lung cancer.  His clinical status and pancytopenia have improved.  He will complete cycle 3 today. He developed mild thrombocytopenia following cycle 2.  The platelets have recovered.  The plan is to add atezolizumab to the chemotherapy regimen today.  We reviewed potential toxicities associated with atezolizumab including the chance of allergic reaction, rash, diarrhea, and various autoimmune diseases.  He agrees to proceed.  Joseph Berg would like to keep the PICC in place for now.  He will return to the Cancer center for PICC flush and dressing changes.  We will check a nadir CBC when he is here for a PICC flush in 2 weeks.  He will be scheduled for an office visit and the next cycle of chemotherapy in 4 weeks.  Joseph Coder, MD  04/30/2018  10:27  AM   

## 2018-05-01 ENCOUNTER — Inpatient Hospital Stay: Payer: Medicare PPO

## 2018-05-01 ENCOUNTER — Other Ambulatory Visit: Payer: Self-pay

## 2018-05-01 ENCOUNTER — Telehealth: Payer: Self-pay | Admitting: Oncology

## 2018-05-01 VITALS — BP 147/64 | HR 62 | Temp 97.7°F | Resp 18

## 2018-05-01 DIAGNOSIS — D6189 Other specified aplastic anemias and other bone marrow failure syndromes: Secondary | ICD-10-CM | POA: Diagnosis not present

## 2018-05-01 DIAGNOSIS — D6481 Anemia due to antineoplastic chemotherapy: Secondary | ICD-10-CM | POA: Diagnosis not present

## 2018-05-01 DIAGNOSIS — R197 Diarrhea, unspecified: Secondary | ICD-10-CM | POA: Diagnosis not present

## 2018-05-01 DIAGNOSIS — Z5111 Encounter for antineoplastic chemotherapy: Secondary | ICD-10-CM | POA: Diagnosis not present

## 2018-05-01 DIAGNOSIS — C3432 Malignant neoplasm of lower lobe, left bronchus or lung: Secondary | ICD-10-CM | POA: Diagnosis not present

## 2018-05-01 DIAGNOSIS — G4733 Obstructive sleep apnea (adult) (pediatric): Secondary | ICD-10-CM | POA: Diagnosis not present

## 2018-05-01 DIAGNOSIS — Z452 Encounter for adjustment and management of vascular access device: Secondary | ICD-10-CM | POA: Diagnosis not present

## 2018-05-01 DIAGNOSIS — C7952 Secondary malignant neoplasm of bone marrow: Secondary | ICD-10-CM | POA: Diagnosis not present

## 2018-05-01 DIAGNOSIS — D6959 Other secondary thrombocytopenia: Secondary | ICD-10-CM | POA: Diagnosis not present

## 2018-05-01 DIAGNOSIS — C801 Malignant (primary) neoplasm, unspecified: Secondary | ICD-10-CM

## 2018-05-01 MED ORDER — SODIUM CHLORIDE 0.9% FLUSH
10.0000 mL | INTRAVENOUS | Status: DC | PRN
  Administered 2018-05-01: 10 mL
  Filled 2018-05-01: qty 10

## 2018-05-01 MED ORDER — SODIUM CHLORIDE 0.9 % IV SOLN
80.0000 mg/m2 | Freq: Once | INTRAVENOUS | Status: AC
  Administered 2018-05-01: 170 mg via INTRAVENOUS
  Filled 2018-05-01: qty 8.5

## 2018-05-01 MED ORDER — DEXAMETHASONE SODIUM PHOSPHATE 10 MG/ML IJ SOLN
INTRAMUSCULAR | Status: AC
  Filled 2018-05-01: qty 1

## 2018-05-01 MED ORDER — HEPARIN SOD (PORK) LOCK FLUSH 100 UNIT/ML IV SOLN
500.0000 [IU] | Freq: Once | INTRAVENOUS | Status: AC | PRN
  Administered 2018-05-01: 500 [IU]
  Filled 2018-05-01: qty 5

## 2018-05-01 MED ORDER — SODIUM CHLORIDE 0.9 % IV SOLN
Freq: Once | INTRAVENOUS | Status: AC
  Administered 2018-05-01: 14:00:00 via INTRAVENOUS
  Filled 2018-05-01: qty 250

## 2018-05-01 MED ORDER — DEXAMETHASONE SODIUM PHOSPHATE 10 MG/ML IJ SOLN
10.0000 mg | Freq: Once | INTRAMUSCULAR | Status: AC
  Administered 2018-05-01: 10 mg via INTRAVENOUS

## 2018-05-01 NOTE — Progress Notes (Signed)
Nutrition Assessment   Reason for Assessment:  Patient identified on Malnutrition Screening report for weight loss and poor appetite   ASSESSMENT:   85 year old male with lung cancer, followed by Dr. Benay Spice.  Patient receiving chemotherapy.  Noted hospital admission 3/12-3/14 for pneumonia.  Past medical history of COPD, thrombocytopenia, CAD, CHF.  RD planning to meet with patient during infusion today but called patient to limit face to face contact due to covid-19 risk.    Patient reports appetite is too good.  Wife confirms patient eating well recently.  "He ate all day yesterday."  Patient reports decreased appetite with recent sickness and hospital visit.  Reports typically has 2 eggs with bacon and toast for breakfast. Lunch is tuna sandwich and cottage cheese and supper is whatever wife prepares. Last night was spaghetti.  Denies nutrition impact symptoms at this time.     Nutrition Focused Physical Exam: deferred   Medications: reviewed   Labs: reviewed   Anthropometrics:   Height: 69 inches Weight: 201 lb 1.6 oz UBW: 210-215 lb per patient.  Noted last at 215 lb on 03/05/2018 BMI: 29  6% weight loss in the last 2 months   NUTRITION DIAGNOSIS: Unintentional weight loss related to recent pneumonia, hospital visit and treatment of lung cancer as evidenced by 6% weight loss in the last 2 months    INTERVENTION:  Encouraged good nutrition during treatment.  Discussed options of oral nutrition supplements if weight continues to go down.  Contact information given to patient and wife and will contact RD if needed in the future.  No follow-up planned at this time.   MONITORING, EVALUATION, GOAL: Patient will consume adequate calories and protein to maintain weight during treatment   Next Visit: no follow-up  Joseph Berg B. Zenia Resides, [place], [place] Registered Dietitian [phone] (pager)

## 2018-05-01 NOTE — Patient Instructions (Signed)
Coronavirus (COVID-19) Are you at risk?  Are you at risk for the Coronavirus (COVID-19)?  To be considered HIGH RISK for Coronavirus (COVID-19), you have to meet the following criteria:  . Traveled to [place], [place], [place], [place] or [place]; or in the [place] to [place], [place], [place], or [place]; and have fever, cough, and shortness of breath within the last 2 weeks of travel OR . Been in close contact with a person diagnosed with COVID-19 within the last 2 weeks and have fever, cough, and shortness of breath . IF YOU DO NOT MEET THESE CRITERIA, YOU ARE CONSIDERED LOW RISK FOR COVID-19.  What to do if you are HIGH RISK for COVID-19?  . If you are having a medical emergency, call 911. . Seek medical care right away. Before you go to a doctor's office, urgent care or emergency department, call ahead and tell them about your recent travel, contact with someone diagnosed with COVID-19, and your symptoms. You should receive instructions from your physician's office regarding next steps of care.  . When you arrive at healthcare provider, tell the healthcare staff immediately you have returned from visiting [place], [place], [place], [place] or [place]; or traveled in the [place] to [place], [place], [place], or [place]; in the last two weeks or you have been in close contact with a person diagnosed with COVID-19 in the last 2 weeks.   . Tell the health care staff about your symptoms: fever, cough and shortness of breath. . After you have been seen by a medical provider, you will be either: o Tested for (COVID-19) and discharged home on quarantine except to seek medical care if symptoms worsen, and asked to  - Stay home and avoid contact with others until you get your results (4-5 days)  - Avoid travel on public transportation if possible (such as bus, train, or airplane) or o Sent to the Emergency Department by EMS for evaluation, COVID-19 testing, and possible  admission depending on your condition and test results.  What to do if you are LOW RISK for COVID-19?  Reduce your risk of any infection by using the same precautions used for avoiding the common cold or flu:  . Wash your hands often with soap and warm water for at least 20 seconds.  If soap and water are not readily available, use an alcohol-based hand sanitizer with at least 60% alcohol.  . If coughing or sneezing, cover your mouth and nose by coughing or sneezing into the elbow areas of your shirt or coat, into a tissue or into your sleeve (not your hands). . Avoid shaking hands with others and consider head nods or verbal greetings only. . Avoid touching your eyes, nose, or mouth with unwashed hands.  . Avoid close contact with people who are sick. . Avoid places or events with large numbers of people in one location, like concerts or sporting events. . Carefully consider travel plans you have or are making. . If you are planning any travel outside or inside the [place], visit the CDC's Travelers' Health webpage for the latest health notices. . If you have some symptoms but not all symptoms, continue to monitor at home and seek medical attention if your symptoms worsen. . If you are having a medical emergency, call 911.   ADDITIONAL HEALTHCARE OPTIONS FOR PATIENTS  [facility] Telehealth / e-Visit: [email or web address]         [facility]: [phone]  [facility]: [facility]: [facility] Discharge Instructions for Patients Receiving Chemotherapy  Today you received the following chemotherapy agents Etoposide  To help prevent nausea and vomiting after your treatment, we encourage you to take your nausea medication as directed.    If you develop nausea and vomiting that is not controlled by your nausea medication, call the clinic.   BELOW ARE  SYMPTOMS THAT SHOULD BE REPORTED IMMEDIATELY:  *FEVER GREATER THAN 100.5 F  *CHILLS WITH OR WITHOUT FEVER  NAUSEA AND VOMITING THAT IS NOT CONTROLLED WITH YOUR NAUSEA MEDICATION  *UNUSUAL SHORTNESS OF BREATH  *UNUSUAL BRUISING OR BLEEDING  TENDERNESS IN MOUTH AND THROAT WITH OR WITHOUT PRESENCE OF ULCERS  *URINARY PROBLEMS  *BOWEL PROBLEMS  UNUSUAL RASH Items with * indicate a potential emergency and should be followed up as soon as possible.  Feel free to call the clinic should you have any questions or concerns. The clinic phone number is [phone].  Please show the [facility] at check-in to the Emergency Department and triage nurse.

## 2018-05-01 NOTE — Telephone Encounter (Signed)
Appointments complete per 3/23 los. Spoke with wife and patient will get updated schedule at visit today. Confirmed with GBS injection should be 3/26 not 3/28.

## 2018-05-02 ENCOUNTER — Other Ambulatory Visit: Payer: Self-pay

## 2018-05-02 ENCOUNTER — Inpatient Hospital Stay: Payer: Medicare PPO

## 2018-05-02 VITALS — BP 127/57 | HR 57 | Temp 98.3°F | Resp 18

## 2018-05-02 DIAGNOSIS — D6959 Other secondary thrombocytopenia: Secondary | ICD-10-CM | POA: Diagnosis not present

## 2018-05-02 DIAGNOSIS — D6481 Anemia due to antineoplastic chemotherapy: Secondary | ICD-10-CM | POA: Diagnosis not present

## 2018-05-02 DIAGNOSIS — C7952 Secondary malignant neoplasm of bone marrow: Secondary | ICD-10-CM | POA: Diagnosis not present

## 2018-05-02 DIAGNOSIS — C3432 Malignant neoplasm of lower lobe, left bronchus or lung: Secondary | ICD-10-CM | POA: Diagnosis not present

## 2018-05-02 DIAGNOSIS — Z452 Encounter for adjustment and management of vascular access device: Secondary | ICD-10-CM | POA: Diagnosis not present

## 2018-05-02 DIAGNOSIS — Z5111 Encounter for antineoplastic chemotherapy: Secondary | ICD-10-CM | POA: Diagnosis not present

## 2018-05-02 DIAGNOSIS — C801 Malignant (primary) neoplasm, unspecified: Secondary | ICD-10-CM

## 2018-05-02 DIAGNOSIS — R197 Diarrhea, unspecified: Secondary | ICD-10-CM | POA: Diagnosis not present

## 2018-05-02 DIAGNOSIS — D6189 Other specified aplastic anemias and other bone marrow failure syndromes: Secondary | ICD-10-CM | POA: Diagnosis not present

## 2018-05-02 MED ORDER — SODIUM CHLORIDE 0.9 % IV SOLN
80.0000 mg/m2 | Freq: Once | INTRAVENOUS | Status: AC
  Administered 2018-05-02: 170 mg via INTRAVENOUS
  Filled 2018-05-02: qty 8.5

## 2018-05-02 MED ORDER — DEXAMETHASONE SODIUM PHOSPHATE 10 MG/ML IJ SOLN
10.0000 mg | Freq: Once | INTRAMUSCULAR | Status: AC
  Administered 2018-05-02: 10 mg via INTRAVENOUS

## 2018-05-02 MED ORDER — DEXAMETHASONE SODIUM PHOSPHATE 10 MG/ML IJ SOLN
INTRAMUSCULAR | Status: AC
  Filled 2018-05-02: qty 1

## 2018-05-02 MED ORDER — SODIUM CHLORIDE 0.9 % IV SOLN
Freq: Once | INTRAVENOUS | Status: AC
  Administered 2018-05-02: 14:00:00 via INTRAVENOUS
  Filled 2018-05-02: qty 250

## 2018-05-02 NOTE — Patient Instructions (Signed)
Coronavirus (COVID-19) Are you at risk?  Are you at risk for the Coronavirus (COVID-19)?  To be considered HIGH RISK for Coronavirus (COVID-19), you have to meet the following criteria:  . Traveled to [place], [place], [place], [place] or [place]; or in the [place] to [place], [place], [place], or [place]; and have fever, cough, and shortness of breath within the last 2 weeks of travel OR . Been in close contact with a person diagnosed with COVID-19 within the last 2 weeks and have fever, cough, and shortness of breath . IF YOU DO NOT MEET THESE CRITERIA, YOU ARE CONSIDERED LOW RISK FOR COVID-19.  What to do if you are HIGH RISK for COVID-19?  . If you are having a medical emergency, call 911. . Seek medical care right away. Before you go to a doctor's office, urgent care or emergency department, call ahead and tell them about your recent travel, contact with someone diagnosed with COVID-19, and your symptoms. You should receive instructions from your physician's office regarding next steps of care.  . When you arrive at healthcare provider, tell the healthcare staff immediately you have returned from visiting [place], [place], [place], [place] or [place]; or traveled in the [place] to [place], [place], [place], or [place]; in the last two weeks or you have been in close contact with a person diagnosed with COVID-19 in the last 2 weeks.   . Tell the health care staff about your symptoms: fever, cough and shortness of breath. . After you have been seen by a medical provider, you will be either: o Tested for (COVID-19) and discharged home on quarantine except to seek medical care if symptoms worsen, and asked to  - Stay home and avoid contact with others until you get your results (4-5 days)  - Avoid travel on public transportation if possible (such as bus, train, or airplane) or o Sent to the Emergency Department by EMS for evaluation, COVID-19 testing, and possible  admission depending on your condition and test results.  What to do if you are LOW RISK for COVID-19?  Reduce your risk of any infection by using the same precautions used for avoiding the common cold or flu:  . Wash your hands often with soap and warm water for at least 20 seconds.  If soap and water are not readily available, use an alcohol-based hand sanitizer with at least 60% alcohol.  . If coughing or sneezing, cover your mouth and nose by coughing or sneezing into the elbow areas of your shirt or coat, into a tissue or into your sleeve (not your hands). . Avoid shaking hands with others and consider head nods or verbal greetings only. . Avoid touching your eyes, nose, or mouth with unwashed hands.  . Avoid close contact with people who are sick. . Avoid places or events with large numbers of people in one location, like concerts or sporting events. . Carefully consider travel plans you have or are making. . If you are planning any travel outside or inside the [place], visit the CDC's Travelers' Health webpage for the latest health notices. . If you have some symptoms but not all symptoms, continue to monitor at home and seek medical attention if your symptoms worsen. . If you are having a medical emergency, call 911.   ADDITIONAL HEALTHCARE OPTIONS FOR PATIENTS  [facility] Telehealth / e-Visit: [email or web address]         [facility]: [phone]  [facility]   [facility]: [facility]: [facility] Discharge Instructions for Patients Receiving Chemotherapy  Today you received the following chemotherapy agents Etoposide  To help prevent nausea and vomiting after your treatment, we encourage you to take your nausea medication as directed.    If you develop nausea and vomiting that is not controlled by your nausea medication, call the clinic.   BELOW ARE  SYMPTOMS THAT SHOULD BE REPORTED IMMEDIATELY:  *FEVER GREATER THAN 100.5 F  *CHILLS WITH OR WITHOUT FEVER  NAUSEA AND VOMITING THAT IS NOT CONTROLLED WITH YOUR NAUSEA MEDICATION  *UNUSUAL SHORTNESS OF BREATH  *UNUSUAL BRUISING OR BLEEDING  TENDERNESS IN MOUTH AND THROAT WITH OR WITHOUT PRESENCE OF ULCERS  *URINARY PROBLEMS  *BOWEL PROBLEMS  UNUSUAL RASH Items with * indicate a potential emergency and should be followed up as soon as possible.  Feel free to call the clinic should you have any questions or concerns. The clinic phone number is [phone].  Please show the [facility] at check-in to the Emergency Department and triage nurse.

## 2018-05-03 ENCOUNTER — Inpatient Hospital Stay: Payer: Medicare PPO

## 2018-05-03 ENCOUNTER — Other Ambulatory Visit: Payer: Self-pay

## 2018-05-03 VITALS — BP 154/57 | HR 65 | Temp 98.2°F | Resp 18

## 2018-05-03 DIAGNOSIS — C7952 Secondary malignant neoplasm of bone marrow: Secondary | ICD-10-CM | POA: Diagnosis not present

## 2018-05-03 DIAGNOSIS — C801 Malignant (primary) neoplasm, unspecified: Secondary | ICD-10-CM

## 2018-05-03 DIAGNOSIS — D6959 Other secondary thrombocytopenia: Secondary | ICD-10-CM | POA: Diagnosis not present

## 2018-05-03 DIAGNOSIS — C3432 Malignant neoplasm of lower lobe, left bronchus or lung: Secondary | ICD-10-CM | POA: Diagnosis not present

## 2018-05-03 DIAGNOSIS — R197 Diarrhea, unspecified: Secondary | ICD-10-CM | POA: Diagnosis not present

## 2018-05-03 DIAGNOSIS — Z95828 Presence of other vascular implants and grafts: Secondary | ICD-10-CM

## 2018-05-03 DIAGNOSIS — Z5111 Encounter for antineoplastic chemotherapy: Secondary | ICD-10-CM | POA: Diagnosis not present

## 2018-05-03 DIAGNOSIS — D6189 Other specified aplastic anemias and other bone marrow failure syndromes: Secondary | ICD-10-CM | POA: Diagnosis not present

## 2018-05-03 DIAGNOSIS — Z452 Encounter for adjustment and management of vascular access device: Secondary | ICD-10-CM | POA: Diagnosis not present

## 2018-05-03 DIAGNOSIS — D6481 Anemia due to antineoplastic chemotherapy: Secondary | ICD-10-CM | POA: Diagnosis not present

## 2018-05-03 MED ORDER — SODIUM CHLORIDE 0.9% FLUSH
10.0000 mL | INTRAVENOUS | Status: DC | PRN
  Filled 2018-05-03: qty 10

## 2018-05-03 MED ORDER — PEGFILGRASTIM-CBQV 6 MG/0.6ML ~~LOC~~ SOSY
6.0000 mg | PREFILLED_SYRINGE | Freq: Once | SUBCUTANEOUS | Status: AC
  Administered 2018-05-03: 6 mg via SUBCUTANEOUS

## 2018-05-03 MED ORDER — PEGFILGRASTIM-CBQV 6 MG/0.6ML ~~LOC~~ SOSY
PREFILLED_SYRINGE | SUBCUTANEOUS | Status: AC
  Filled 2018-05-03: qty 0.6

## 2018-05-03 MED ORDER — HEPARIN SOD (PORK) LOCK FLUSH 100 UNIT/ML IV SOLN
500.0000 [IU] | Freq: Once | INTRAVENOUS | Status: DC | PRN
  Filled 2018-05-03: qty 5

## 2018-05-04 ENCOUNTER — Ambulatory Visit: Payer: Medicare PPO | Admitting: Family Medicine

## 2018-05-05 ENCOUNTER — Other Ambulatory Visit: Payer: Self-pay

## 2018-05-05 ENCOUNTER — Inpatient Hospital Stay: Payer: Medicare PPO

## 2018-05-05 ENCOUNTER — Ambulatory Visit: Payer: Medicare PPO

## 2018-05-05 VITALS — Temp 97.5°F

## 2018-05-05 DIAGNOSIS — Z452 Encounter for adjustment and management of vascular access device: Secondary | ICD-10-CM | POA: Diagnosis not present

## 2018-05-05 DIAGNOSIS — D6481 Anemia due to antineoplastic chemotherapy: Secondary | ICD-10-CM | POA: Diagnosis not present

## 2018-05-05 DIAGNOSIS — C3432 Malignant neoplasm of lower lobe, left bronchus or lung: Secondary | ICD-10-CM | POA: Diagnosis not present

## 2018-05-05 DIAGNOSIS — C7952 Secondary malignant neoplasm of bone marrow: Secondary | ICD-10-CM | POA: Diagnosis not present

## 2018-05-05 DIAGNOSIS — Z95828 Presence of other vascular implants and grafts: Secondary | ICD-10-CM

## 2018-05-05 DIAGNOSIS — D6189 Other specified aplastic anemias and other bone marrow failure syndromes: Secondary | ICD-10-CM | POA: Diagnosis not present

## 2018-05-05 DIAGNOSIS — R197 Diarrhea, unspecified: Secondary | ICD-10-CM | POA: Diagnosis not present

## 2018-05-05 DIAGNOSIS — D6959 Other secondary thrombocytopenia: Secondary | ICD-10-CM | POA: Diagnosis not present

## 2018-05-05 DIAGNOSIS — Z5111 Encounter for antineoplastic chemotherapy: Secondary | ICD-10-CM | POA: Diagnosis not present

## 2018-05-05 MED ORDER — HEPARIN SOD (PORK) LOCK FLUSH 100 UNIT/ML IV SOLN
500.0000 [IU] | Freq: Once | INTRAVENOUS | Status: AC | PRN
  Administered 2018-05-05: 250 [IU]
  Filled 2018-05-05: qty 5

## 2018-05-05 MED ORDER — SODIUM CHLORIDE 0.9% FLUSH
10.0000 mL | INTRAVENOUS | Status: DC | PRN
  Administered 2018-05-05: 10 mL
  Filled 2018-05-05: qty 10

## 2018-05-07 ENCOUNTER — Other Ambulatory Visit: Payer: Self-pay

## 2018-05-07 ENCOUNTER — Inpatient Hospital Stay (HOSPITAL_BASED_OUTPATIENT_CLINIC_OR_DEPARTMENT_OTHER): Payer: Medicare PPO | Admitting: Nurse Practitioner

## 2018-05-07 ENCOUNTER — Telehealth: Payer: Self-pay | Admitting: Emergency Medicine

## 2018-05-07 ENCOUNTER — Inpatient Hospital Stay: Payer: Medicare PPO

## 2018-05-07 VITALS — BP 134/74 | HR 77 | Temp 97.7°F | Resp 17 | Ht 69.0 in | Wt 210.2 lb

## 2018-05-07 DIAGNOSIS — I2510 Atherosclerotic heart disease of native coronary artery without angina pectoris: Secondary | ICD-10-CM

## 2018-05-07 DIAGNOSIS — J449 Chronic obstructive pulmonary disease, unspecified: Secondary | ICD-10-CM

## 2018-05-07 DIAGNOSIS — D6959 Other secondary thrombocytopenia: Secondary | ICD-10-CM | POA: Diagnosis not present

## 2018-05-07 DIAGNOSIS — Z95828 Presence of other vascular implants and grafts: Secondary | ICD-10-CM

## 2018-05-07 DIAGNOSIS — D6481 Anemia due to antineoplastic chemotherapy: Secondary | ICD-10-CM | POA: Diagnosis not present

## 2018-05-07 DIAGNOSIS — N289 Disorder of kidney and ureter, unspecified: Secondary | ICD-10-CM

## 2018-05-07 DIAGNOSIS — E876 Hypokalemia: Secondary | ICD-10-CM

## 2018-05-07 DIAGNOSIS — I509 Heart failure, unspecified: Secondary | ICD-10-CM

## 2018-05-07 DIAGNOSIS — Z5111 Encounter for antineoplastic chemotherapy: Secondary | ICD-10-CM | POA: Diagnosis not present

## 2018-05-07 DIAGNOSIS — C3490 Malignant neoplasm of unspecified part of unspecified bronchus or lung: Secondary | ICD-10-CM

## 2018-05-07 DIAGNOSIS — C7952 Secondary malignant neoplasm of bone marrow: Secondary | ICD-10-CM | POA: Diagnosis not present

## 2018-05-07 DIAGNOSIS — Z452 Encounter for adjustment and management of vascular access device: Secondary | ICD-10-CM | POA: Diagnosis not present

## 2018-05-07 DIAGNOSIS — C3432 Malignant neoplasm of lower lobe, left bronchus or lung: Secondary | ICD-10-CM

## 2018-05-07 DIAGNOSIS — M545 Low back pain: Secondary | ICD-10-CM | POA: Diagnosis not present

## 2018-05-07 DIAGNOSIS — D701 Agranulocytosis secondary to cancer chemotherapy: Secondary | ICD-10-CM | POA: Diagnosis not present

## 2018-05-07 DIAGNOSIS — D6189 Other specified aplastic anemias and other bone marrow failure syndromes: Secondary | ICD-10-CM | POA: Diagnosis not present

## 2018-05-07 DIAGNOSIS — R197 Diarrhea, unspecified: Secondary | ICD-10-CM | POA: Diagnosis not present

## 2018-05-07 MED ORDER — SODIUM CHLORIDE 0.9% FLUSH
10.0000 mL | INTRAVENOUS | Status: DC | PRN
  Administered 2018-05-07: 10 mL
  Filled 2018-05-07: qty 10

## 2018-05-07 MED ORDER — HEPARIN SOD (PORK) LOCK FLUSH 100 UNIT/ML IV SOLN
500.0000 [IU] | Freq: Once | INTRAVENOUS | Status: AC | PRN
  Administered 2018-05-07: 250 [IU]
  Filled 2018-05-07: qty 5

## 2018-05-07 NOTE — Telephone Encounter (Signed)
Per Dr. Benay Spice: Schedule w/Lisa Marcello Moores, NP today at 1:45. Wife agrees.

## 2018-05-07 NOTE — Progress Notes (Addendum)
  [facility] OFFICE PROGRESS NOTE   Diagnosis: Small cell lung cancer  INTERVAL HISTORY:   [name] returns prior to scheduled follow-up evaluation of back pain.  He completed cycle 3 carboplatin/etoposide plus atezolizumab beginning 04/30/2018.  He received Udenyca 05/03/2018.  3 to 4 days ago he developed low back pain.  The pain is relieved with Tylenol.  He denies leg weakness or numbness.  No bowel or bladder dysfunction.  He denies nausea/vomiting.  No mouth sores.  No diarrhea.  He denies shortness of breath, cough and fever.  In general, he feels well.  Objective:  Vital signs in last 24 hours:  Blood pressure 134/74, pulse 77, temperature 97.7 F (36.5 C), temperature source Oral, resp. rate 17, height '5\' 9"'$  (1.753 m), weight 210 lb 3.2 oz (95.3 kg), SpO2 96 %.    HEENT: No thrush or ulcers. Resp: Respirations even and unlabored. Neuro: Motor strength 5/5.  Knee DTRs 2+, symmetric. Right upper extremity PICC site is without erythema.  Lab Results:  Lab Results  Component Value Date   WBC 9.5 04/30/2018   HGB 10.3 (L) 04/30/2018   HCT 31.9 (L) 04/30/2018   MCV 93.8 04/30/2018   PLT 200 04/30/2018   NEUTROABS 6.6 04/30/2018    Imaging:  No results found.  Medications: I have reviewed the patient's current medications.  Assessment/Plan: 1.Extensive stage small cell lung cancer  CT chest 03/05/2018-left hilar mass, small mediastinal lymph nodes, atelectasis versus consolidation versus mass in the superior segment of the left lower lobe  Bone marrow biopsy 03/07/2018-extensive involvement of the bone marrow with metastatic small cell carcinoma consistent with a lung primary, small foci of non-small cell differentiation  Cycle 1 etoposide/carboplatin 03/09/2018, G-CSF started 03/12/2018 and discontinued 03/20/2018  CT head 03/21/2018-no evidence of metastatic disease  Cycle 2 etoposide/carboplatin, dose escalation of etoposide and carboplatin  04/03/2018  Cycle 3 etoposide/carboplatin with addition of atezolizumab 04/30/2018 2. Thrombocytopenia secondary to #1-improved 3.COPD 4.History of coronary artery disease 5.CHF 6.BPH 7.Admission with pneumonia December 2019 8.Mild renal insufficiency-improved 9.Elevated liver enzymes 10. Coagulopathy 11. History of mild elevation of the calcium level 12. Anemia/leukopenia secondary to small cell lung cancer and chemotherapy-improved 13.  Hypokalemia-likely secondary to furosemide therapy, potassium supplement started 04/03/2018 14.  Admission 04/19/2018 with left lung pneumonia  Disposition: [name] appears stable.  He completed cycle 3 etoposide/carboplatin plus atezolizumab beginning 04/30/2018.  He received Udenyca 05/03/2018.  Several days ago he developed low back pain.  We discussed that the low back pain is likely related to a Udenyca and will hopefully improve over the next several days.  He will continue Tylenol as needed.  He understands to contact the office if the pain worsens, he develops leg weakness/numbness, bowel/bladder dysfunction.  I contacted his wife with this information as well.  Patient seen with Dr. Benay Spice.  Ned Card ANP/GNP-BC   05/07/2018  1:28 PM  This was a shared visit with Ned Card.  [name] appears well.  The lower back pain is likely related to G-CSF.  He will call for persistent pain.  He will return for an office visit and cycle 4 chemotherapy as scheduled.  Julieanne Manson, MD

## 2018-05-07 NOTE — Telephone Encounter (Signed)
Pt's wife called requesting that pt see someone today during his PICC line flush to assess his pain.  Reports that his pain has been chronic in his legs and lower back, however states that it appears to be intermittently getting worse and that it impedes his ADLs when it does.  States he does not take anything for it.  Helene Kelp also asked if pt should be back on his blood thinner d/t being unsure of when his port is being placed at this time.  RN Learta Codding will speak with MD Benay Spice and/or PA Lucianne Lei, will let pt know this information at his appt.  Wife VU and denies any further questions or concerns at this time, aware of current no visitor policy.

## 2018-05-07 NOTE — Telephone Encounter (Signed)
Spoke to MD Benay Spice and desk RN Manuela Schwartz about pt/wife's concerns.  RN Manuela Schwartz will contact patient/wife and f/u on concerns from phone call today.

## 2018-05-08 ENCOUNTER — Telehealth: Payer: Self-pay | Admitting: Nurse Practitioner

## 2018-05-08 NOTE — Telephone Encounter (Signed)
No los per 3/30.

## 2018-05-09 ENCOUNTER — Other Ambulatory Visit: Payer: Self-pay

## 2018-05-09 ENCOUNTER — Inpatient Hospital Stay: Payer: Medicare PPO | Attending: Oncology

## 2018-05-09 DIAGNOSIS — Z5189 Encounter for other specified aftercare: Secondary | ICD-10-CM | POA: Diagnosis not present

## 2018-05-09 DIAGNOSIS — Z452 Encounter for adjustment and management of vascular access device: Secondary | ICD-10-CM | POA: Diagnosis not present

## 2018-05-09 DIAGNOSIS — Z5111 Encounter for antineoplastic chemotherapy: Secondary | ICD-10-CM | POA: Diagnosis not present

## 2018-05-09 DIAGNOSIS — C3432 Malignant neoplasm of lower lobe, left bronchus or lung: Secondary | ICD-10-CM | POA: Insufficient documentation

## 2018-05-09 DIAGNOSIS — C7952 Secondary malignant neoplasm of bone marrow: Secondary | ICD-10-CM | POA: Insufficient documentation

## 2018-05-09 DIAGNOSIS — Z5112 Encounter for antineoplastic immunotherapy: Secondary | ICD-10-CM | POA: Diagnosis not present

## 2018-05-09 DIAGNOSIS — Z95828 Presence of other vascular implants and grafts: Secondary | ICD-10-CM

## 2018-05-09 MED ORDER — SODIUM CHLORIDE 0.9% FLUSH
10.0000 mL | INTRAVENOUS | Status: DC | PRN
  Administered 2018-05-09: 10 mL
  Filled 2018-05-09: qty 10

## 2018-05-09 MED ORDER — HEPARIN SOD (PORK) LOCK FLUSH 100 UNIT/ML IV SOLN
500.0000 [IU] | Freq: Once | INTRAVENOUS | Status: AC | PRN
  Administered 2018-05-09: 250 [IU]
  Filled 2018-05-09: qty 5

## 2018-05-10 ENCOUNTER — Telehealth: Payer: Self-pay | Admitting: *Deleted

## 2018-05-10 NOTE — Telephone Encounter (Signed)
"  Yoe wife Reason Joseph Berg [phone]).  His P.I.C.C. line irritated him all night.  Skin lifting off with dressing.  Can I put Neosporin on this area?"  "P.I.C.C line dressing is sealed.  It's just the edge of the top right corner of dressing is raised about a fourth inch.  Noticed orange spot yesterday but no bleeding.  He is prone to skin tears.  Says it's not bothering him now."     Conveyed not to compromise dressing seal.  Reinforce edge, OTC medications may provide relief.   Tomorrow's appointment message added to change dressing.

## 2018-05-11 ENCOUNTER — Other Ambulatory Visit: Payer: Self-pay

## 2018-05-11 ENCOUNTER — Other Ambulatory Visit: Payer: Self-pay | Admitting: Nurse Practitioner

## 2018-05-11 ENCOUNTER — Inpatient Hospital Stay: Payer: Medicare PPO

## 2018-05-11 DIAGNOSIS — C3432 Malignant neoplasm of lower lobe, left bronchus or lung: Secondary | ICD-10-CM | POA: Diagnosis not present

## 2018-05-11 DIAGNOSIS — Z95828 Presence of other vascular implants and grafts: Secondary | ICD-10-CM

## 2018-05-11 DIAGNOSIS — Z5112 Encounter for antineoplastic immunotherapy: Secondary | ICD-10-CM | POA: Diagnosis not present

## 2018-05-11 DIAGNOSIS — Z5189 Encounter for other specified aftercare: Secondary | ICD-10-CM | POA: Diagnosis not present

## 2018-05-11 DIAGNOSIS — C7952 Secondary malignant neoplasm of bone marrow: Secondary | ICD-10-CM | POA: Diagnosis not present

## 2018-05-11 DIAGNOSIS — Z452 Encounter for adjustment and management of vascular access device: Secondary | ICD-10-CM | POA: Diagnosis not present

## 2018-05-11 DIAGNOSIS — C3490 Malignant neoplasm of unspecified part of unspecified bronchus or lung: Secondary | ICD-10-CM

## 2018-05-11 DIAGNOSIS — Z5111 Encounter for antineoplastic chemotherapy: Secondary | ICD-10-CM | POA: Diagnosis not present

## 2018-05-11 MED ORDER — SODIUM CHLORIDE 0.9% FLUSH
10.0000 mL | INTRAVENOUS | Status: DC | PRN
  Administered 2018-05-11: 14:00:00 10 mL
  Filled 2018-05-11: qty 10

## 2018-05-11 MED ORDER — HEPARIN SOD (PORK) LOCK FLUSH 100 UNIT/ML IV SOLN
500.0000 [IU] | Freq: Once | INTRAVENOUS | Status: AC | PRN
  Administered 2018-05-11: 500 [IU]
  Filled 2018-05-11: qty 5

## 2018-05-11 NOTE — Progress Notes (Signed)
Pt was seen to have picc flushed and dressing changed. Around the edge of the top of his dressing his skin was inflamed and raw. I cleaned the sight and applied skin prep. The dressing was changed with a opsite dressing.

## 2018-05-14 ENCOUNTER — Other Ambulatory Visit: Payer: Self-pay

## 2018-05-14 ENCOUNTER — Telehealth: Payer: Self-pay | Admitting: Nurse Practitioner

## 2018-05-14 ENCOUNTER — Inpatient Hospital Stay: Payer: Medicare PPO

## 2018-05-14 DIAGNOSIS — C7952 Secondary malignant neoplasm of bone marrow: Secondary | ICD-10-CM | POA: Diagnosis not present

## 2018-05-14 DIAGNOSIS — Z5112 Encounter for antineoplastic immunotherapy: Secondary | ICD-10-CM | POA: Diagnosis not present

## 2018-05-14 DIAGNOSIS — Z5189 Encounter for other specified aftercare: Secondary | ICD-10-CM | POA: Diagnosis not present

## 2018-05-14 DIAGNOSIS — Z5111 Encounter for antineoplastic chemotherapy: Secondary | ICD-10-CM | POA: Diagnosis not present

## 2018-05-14 DIAGNOSIS — C3490 Malignant neoplasm of unspecified part of unspecified bronchus or lung: Secondary | ICD-10-CM

## 2018-05-14 DIAGNOSIS — Z95828 Presence of other vascular implants and grafts: Secondary | ICD-10-CM

## 2018-05-14 DIAGNOSIS — C3432 Malignant neoplasm of lower lobe, left bronchus or lung: Secondary | ICD-10-CM | POA: Diagnosis not present

## 2018-05-14 DIAGNOSIS — Z452 Encounter for adjustment and management of vascular access device: Secondary | ICD-10-CM | POA: Diagnosis not present

## 2018-05-14 LAB — CBC WITH DIFFERENTIAL (CANCER CENTER ONLY)
Abs Immature Granulocytes: 0.47 10*3/uL — ABNORMAL HIGH (ref 0.00–0.07)
Basophils Absolute: 0 10*3/uL (ref 0.0–0.1)
Basophils Relative: 0 %
Eosinophils Absolute: 0.1 10*3/uL (ref 0.0–0.5)
Eosinophils Relative: 1 %
HCT: 28.2 % — ABNORMAL LOW (ref 39.0–52.0)
Hemoglobin: 9 g/dL — ABNORMAL LOW (ref 13.0–17.0)
Immature Granulocytes: 6 %
Lymphocytes Relative: 24 %
Lymphs Abs: 1.9 10*3/uL (ref 0.7–4.0)
MCH: 30.4 pg (ref 26.0–34.0)
MCHC: 31.9 g/dL (ref 30.0–36.0)
MCV: 95.3 fL (ref 80.0–100.0)
Monocytes Absolute: 0.9 10*3/uL (ref 0.1–1.0)
Monocytes Relative: 12 %
Neutro Abs: 4.6 10*3/uL (ref 1.7–7.7)
Neutrophils Relative %: 57 %
Platelet Count: 64 10*3/uL — ABNORMAL LOW (ref 150–400)
RBC: 2.96 MIL/uL — ABNORMAL LOW (ref 4.22–5.81)
RDW: 17 % — ABNORMAL HIGH (ref 11.5–15.5)
WBC Count: 8.1 10*3/uL (ref 4.0–10.5)
nRBC: 0.7 % — ABNORMAL HIGH (ref 0.0–0.2)

## 2018-05-14 MED ORDER — SODIUM CHLORIDE 0.9% FLUSH
10.0000 mL | INTRAVENOUS | Status: DC | PRN
  Administered 2018-05-14: 14:00:00 10 mL
  Filled 2018-05-14: qty 10

## 2018-05-14 MED ORDER — HEPARIN SOD (PORK) LOCK FLUSH 100 UNIT/ML IV SOLN
500.0000 [IU] | Freq: Once | INTRAVENOUS | Status: AC | PRN
  Administered 2018-05-14: 500 [IU]
  Filled 2018-05-14: qty 5

## 2018-05-14 NOTE — Telephone Encounter (Signed)
I contacted Joseph Berg with lab results from today.  He understands the platelet count is low and should contact the office with any bleeding.

## 2018-05-16 ENCOUNTER — Other Ambulatory Visit: Payer: Self-pay

## 2018-05-16 ENCOUNTER — Inpatient Hospital Stay: Payer: Medicare PPO

## 2018-05-16 DIAGNOSIS — Z5112 Encounter for antineoplastic immunotherapy: Secondary | ICD-10-CM | POA: Diagnosis not present

## 2018-05-16 DIAGNOSIS — Z452 Encounter for adjustment and management of vascular access device: Secondary | ICD-10-CM | POA: Diagnosis not present

## 2018-05-16 DIAGNOSIS — C3432 Malignant neoplasm of lower lobe, left bronchus or lung: Secondary | ICD-10-CM | POA: Diagnosis not present

## 2018-05-16 DIAGNOSIS — Z95828 Presence of other vascular implants and grafts: Secondary | ICD-10-CM

## 2018-05-16 DIAGNOSIS — C7952 Secondary malignant neoplasm of bone marrow: Secondary | ICD-10-CM | POA: Diagnosis not present

## 2018-05-16 DIAGNOSIS — Z5111 Encounter for antineoplastic chemotherapy: Secondary | ICD-10-CM | POA: Diagnosis not present

## 2018-05-16 DIAGNOSIS — Z5189 Encounter for other specified aftercare: Secondary | ICD-10-CM | POA: Diagnosis not present

## 2018-05-16 MED ORDER — SODIUM CHLORIDE 0.9% FLUSH
10.0000 mL | INTRAVENOUS | Status: DC | PRN
  Administered 2018-05-16: 14:00:00 10 mL
  Filled 2018-05-16: qty 10

## 2018-05-16 MED ORDER — HEPARIN SOD (PORK) LOCK FLUSH 100 UNIT/ML IV SOLN
500.0000 [IU] | Freq: Once | INTRAVENOUS | Status: AC | PRN
  Administered 2018-05-16: 250 [IU]
  Filled 2018-05-16: qty 5

## 2018-05-18 ENCOUNTER — Inpatient Hospital Stay: Payer: Medicare PPO

## 2018-05-18 ENCOUNTER — Other Ambulatory Visit: Payer: Self-pay

## 2018-05-18 DIAGNOSIS — C7952 Secondary malignant neoplasm of bone marrow: Secondary | ICD-10-CM | POA: Diagnosis not present

## 2018-05-18 DIAGNOSIS — Z95828 Presence of other vascular implants and grafts: Secondary | ICD-10-CM

## 2018-05-18 DIAGNOSIS — C3432 Malignant neoplasm of lower lobe, left bronchus or lung: Secondary | ICD-10-CM | POA: Diagnosis not present

## 2018-05-18 DIAGNOSIS — Z5189 Encounter for other specified aftercare: Secondary | ICD-10-CM | POA: Diagnosis not present

## 2018-05-18 DIAGNOSIS — Z452 Encounter for adjustment and management of vascular access device: Secondary | ICD-10-CM | POA: Diagnosis not present

## 2018-05-18 DIAGNOSIS — Z5112 Encounter for antineoplastic immunotherapy: Secondary | ICD-10-CM | POA: Diagnosis not present

## 2018-05-18 DIAGNOSIS — Z5111 Encounter for antineoplastic chemotherapy: Secondary | ICD-10-CM | POA: Diagnosis not present

## 2018-05-18 MED ORDER — HEPARIN SOD (PORK) LOCK FLUSH 100 UNIT/ML IV SOLN
500.0000 [IU] | Freq: Once | INTRAVENOUS | Status: AC | PRN
  Administered 2018-05-18: 250 [IU]
  Filled 2018-05-18: qty 5

## 2018-05-18 MED ORDER — SODIUM CHLORIDE 0.9% FLUSH
10.0000 mL | INTRAVENOUS | Status: DC | PRN
  Administered 2018-05-18: 10 mL
  Filled 2018-05-18: qty 10

## 2018-05-20 ENCOUNTER — Other Ambulatory Visit: Payer: Self-pay | Admitting: Oncology

## 2018-05-21 ENCOUNTER — Inpatient Hospital Stay: Payer: Medicare PPO

## 2018-05-21 ENCOUNTER — Other Ambulatory Visit: Payer: Self-pay

## 2018-05-21 DIAGNOSIS — Z95828 Presence of other vascular implants and grafts: Secondary | ICD-10-CM

## 2018-05-21 DIAGNOSIS — C7952 Secondary malignant neoplasm of bone marrow: Secondary | ICD-10-CM | POA: Diagnosis not present

## 2018-05-21 DIAGNOSIS — Z5189 Encounter for other specified aftercare: Secondary | ICD-10-CM | POA: Diagnosis not present

## 2018-05-21 DIAGNOSIS — Z452 Encounter for adjustment and management of vascular access device: Secondary | ICD-10-CM | POA: Diagnosis not present

## 2018-05-21 DIAGNOSIS — C3432 Malignant neoplasm of lower lobe, left bronchus or lung: Secondary | ICD-10-CM | POA: Diagnosis not present

## 2018-05-21 DIAGNOSIS — Z5111 Encounter for antineoplastic chemotherapy: Secondary | ICD-10-CM | POA: Diagnosis not present

## 2018-05-21 DIAGNOSIS — Z5112 Encounter for antineoplastic immunotherapy: Secondary | ICD-10-CM | POA: Diagnosis not present

## 2018-05-21 MED ORDER — HEPARIN SOD (PORK) LOCK FLUSH 100 UNIT/ML IV SOLN
500.0000 [IU] | Freq: Once | INTRAVENOUS | Status: AC | PRN
  Administered 2018-05-21: 250 [IU]
  Filled 2018-05-21: qty 5

## 2018-05-21 MED ORDER — SODIUM CHLORIDE 0.9% FLUSH
10.0000 mL | INTRAVENOUS | Status: DC | PRN
  Administered 2018-05-21: 14:00:00 10 mL
  Filled 2018-05-21: qty 10

## 2018-05-23 ENCOUNTER — Other Ambulatory Visit: Payer: Self-pay

## 2018-05-23 ENCOUNTER — Inpatient Hospital Stay: Payer: Medicare PPO

## 2018-05-23 DIAGNOSIS — C7952 Secondary malignant neoplasm of bone marrow: Secondary | ICD-10-CM | POA: Diagnosis not present

## 2018-05-23 DIAGNOSIS — C3432 Malignant neoplasm of lower lobe, left bronchus or lung: Secondary | ICD-10-CM | POA: Diagnosis not present

## 2018-05-23 DIAGNOSIS — Z5112 Encounter for antineoplastic immunotherapy: Secondary | ICD-10-CM | POA: Diagnosis not present

## 2018-05-23 DIAGNOSIS — Z5111 Encounter for antineoplastic chemotherapy: Secondary | ICD-10-CM | POA: Diagnosis not present

## 2018-05-23 DIAGNOSIS — Z5189 Encounter for other specified aftercare: Secondary | ICD-10-CM | POA: Diagnosis not present

## 2018-05-23 DIAGNOSIS — Z95828 Presence of other vascular implants and grafts: Secondary | ICD-10-CM

## 2018-05-23 DIAGNOSIS — Z452 Encounter for adjustment and management of vascular access device: Secondary | ICD-10-CM | POA: Diagnosis not present

## 2018-05-23 MED ORDER — HEPARIN SOD (PORK) LOCK FLUSH 100 UNIT/ML IV SOLN
500.0000 [IU] | Freq: Once | INTRAVENOUS | Status: AC | PRN
  Administered 2018-05-23: 250 [IU]
  Filled 2018-05-23: qty 5

## 2018-05-23 MED ORDER — SODIUM CHLORIDE 0.9% FLUSH
10.0000 mL | INTRAVENOUS | Status: DC | PRN
  Administered 2018-05-23: 14:00:00 10 mL
  Filled 2018-05-23: qty 10

## 2018-05-25 ENCOUNTER — Other Ambulatory Visit: Payer: Self-pay | Admitting: *Deleted

## 2018-05-25 ENCOUNTER — Inpatient Hospital Stay: Payer: Medicare PPO

## 2018-05-25 ENCOUNTER — Other Ambulatory Visit: Payer: Self-pay

## 2018-05-25 DIAGNOSIS — C3432 Malignant neoplasm of lower lobe, left bronchus or lung: Secondary | ICD-10-CM | POA: Diagnosis not present

## 2018-05-25 DIAGNOSIS — C3490 Malignant neoplasm of unspecified part of unspecified bronchus or lung: Secondary | ICD-10-CM

## 2018-05-25 DIAGNOSIS — Z5111 Encounter for antineoplastic chemotherapy: Secondary | ICD-10-CM | POA: Diagnosis not present

## 2018-05-25 DIAGNOSIS — Z452 Encounter for adjustment and management of vascular access device: Secondary | ICD-10-CM | POA: Diagnosis not present

## 2018-05-25 DIAGNOSIS — Z5189 Encounter for other specified aftercare: Secondary | ICD-10-CM | POA: Diagnosis not present

## 2018-05-25 DIAGNOSIS — Z95828 Presence of other vascular implants and grafts: Secondary | ICD-10-CM

## 2018-05-25 DIAGNOSIS — C7952 Secondary malignant neoplasm of bone marrow: Secondary | ICD-10-CM | POA: Diagnosis not present

## 2018-05-25 DIAGNOSIS — Z5112 Encounter for antineoplastic immunotherapy: Secondary | ICD-10-CM | POA: Diagnosis not present

## 2018-05-25 MED ORDER — HEPARIN SOD (PORK) LOCK FLUSH 100 UNIT/ML IV SOLN
500.0000 [IU] | Freq: Once | INTRAVENOUS | Status: AC | PRN
  Administered 2018-05-25: 14:00:00 500 [IU]
  Filled 2018-05-25: qty 5

## 2018-05-25 MED ORDER — SODIUM CHLORIDE 0.9% FLUSH
10.0000 mL | INTRAVENOUS | Status: DC | PRN
  Administered 2018-05-25: 10 mL
  Filled 2018-05-25: qty 10

## 2018-05-28 ENCOUNTER — Inpatient Hospital Stay (HOSPITAL_BASED_OUTPATIENT_CLINIC_OR_DEPARTMENT_OTHER): Payer: Medicare PPO | Admitting: Oncology

## 2018-05-28 ENCOUNTER — Telehealth: Payer: Self-pay | Admitting: Oncology

## 2018-05-28 ENCOUNTER — Inpatient Hospital Stay: Payer: Medicare PPO

## 2018-05-28 ENCOUNTER — Other Ambulatory Visit: Payer: Self-pay

## 2018-05-28 ENCOUNTER — Ambulatory Visit (INDEPENDENT_AMBULATORY_CARE_PROVIDER_SITE_OTHER): Payer: Medicare PPO | Admitting: Family Medicine

## 2018-05-28 ENCOUNTER — Encounter: Payer: Self-pay | Admitting: Family Medicine

## 2018-05-28 VITALS — BP 139/73 | HR 83 | Temp 97.6°F | Ht 69.0 in | Wt 208.0 lb

## 2018-05-28 VITALS — BP 139/73 | HR 83 | Temp 97.6°F | Resp 18 | Ht 69.0 in | Wt 208.2 lb

## 2018-05-28 DIAGNOSIS — C3432 Malignant neoplasm of lower lobe, left bronchus or lung: Secondary | ICD-10-CM | POA: Diagnosis not present

## 2018-05-28 DIAGNOSIS — G4733 Obstructive sleep apnea (adult) (pediatric): Secondary | ICD-10-CM | POA: Diagnosis not present

## 2018-05-28 DIAGNOSIS — J449 Chronic obstructive pulmonary disease, unspecified: Secondary | ICD-10-CM | POA: Diagnosis not present

## 2018-05-28 DIAGNOSIS — D6959 Other secondary thrombocytopenia: Secondary | ICD-10-CM | POA: Diagnosis not present

## 2018-05-28 DIAGNOSIS — I509 Heart failure, unspecified: Secondary | ICD-10-CM | POA: Diagnosis not present

## 2018-05-28 DIAGNOSIS — C3490 Malignant neoplasm of unspecified part of unspecified bronchus or lung: Secondary | ICD-10-CM

## 2018-05-28 DIAGNOSIS — Z5111 Encounter for antineoplastic chemotherapy: Secondary | ICD-10-CM | POA: Diagnosis not present

## 2018-05-28 DIAGNOSIS — C7952 Secondary malignant neoplasm of bone marrow: Secondary | ICD-10-CM

## 2018-05-28 DIAGNOSIS — Z5189 Encounter for other specified aftercare: Secondary | ICD-10-CM | POA: Diagnosis not present

## 2018-05-28 DIAGNOSIS — I2510 Atherosclerotic heart disease of native coronary artery without angina pectoris: Secondary | ICD-10-CM | POA: Diagnosis not present

## 2018-05-28 DIAGNOSIS — J189 Pneumonia, unspecified organism: Secondary | ICD-10-CM | POA: Diagnosis not present

## 2018-05-28 DIAGNOSIS — N289 Disorder of kidney and ureter, unspecified: Secondary | ICD-10-CM | POA: Diagnosis not present

## 2018-05-28 DIAGNOSIS — R600 Localized edema: Secondary | ICD-10-CM

## 2018-05-28 DIAGNOSIS — Z5112 Encounter for antineoplastic immunotherapy: Secondary | ICD-10-CM | POA: Diagnosis not present

## 2018-05-28 DIAGNOSIS — C801 Malignant (primary) neoplasm, unspecified: Secondary | ICD-10-CM

## 2018-05-28 DIAGNOSIS — Z95828 Presence of other vascular implants and grafts: Secondary | ICD-10-CM

## 2018-05-28 DIAGNOSIS — D6481 Anemia due to antineoplastic chemotherapy: Secondary | ICD-10-CM

## 2018-05-28 DIAGNOSIS — Z452 Encounter for adjustment and management of vascular access device: Secondary | ICD-10-CM | POA: Diagnosis not present

## 2018-05-28 DIAGNOSIS — I5042 Chronic combined systolic (congestive) and diastolic (congestive) heart failure: Secondary | ICD-10-CM

## 2018-05-28 LAB — CBC WITH DIFFERENTIAL (CANCER CENTER ONLY)
Abs Immature Granulocytes: 0.03 10*3/uL (ref 0.00–0.07)
Basophils Absolute: 0 10*3/uL (ref 0.0–0.1)
Basophils Relative: 0 %
Eosinophils Absolute: 0 10*3/uL (ref 0.0–0.5)
Eosinophils Relative: 0 %
HCT: 31.2 % — ABNORMAL LOW (ref 39.0–52.0)
Hemoglobin: 9.8 g/dL — ABNORMAL LOW (ref 13.0–17.0)
Immature Granulocytes: 0 %
Lymphocytes Relative: 21 %
Lymphs Abs: 1.7 10*3/uL (ref 0.7–4.0)
MCH: 31 pg (ref 26.0–34.0)
MCHC: 31.4 g/dL (ref 30.0–36.0)
MCV: 98.7 fL (ref 80.0–100.0)
Monocytes Absolute: 1 10*3/uL (ref 0.1–1.0)
Monocytes Relative: 12 %
Neutro Abs: 5.3 10*3/uL (ref 1.7–7.7)
Neutrophils Relative %: 67 %
Platelet Count: 158 10*3/uL (ref 150–400)
RBC: 3.16 MIL/uL — ABNORMAL LOW (ref 4.22–5.81)
RDW: 19.2 % — ABNORMAL HIGH (ref 11.5–15.5)
WBC Count: 8.1 10*3/uL (ref 4.0–10.5)
nRBC: 0.2 % (ref 0.0–0.2)

## 2018-05-28 LAB — CMP (CANCER CENTER ONLY)
ALT: 24 U/L (ref 0–44)
AST: 25 U/L (ref 15–41)
Albumin: 3.8 g/dL (ref 3.5–5.0)
Alkaline Phosphatase: 64 U/L (ref 38–126)
Anion gap: 10 (ref 5–15)
BUN: 18 mg/dL (ref 8–23)
CO2: 25 mmol/L (ref 22–32)
Calcium: 10.4 mg/dL — ABNORMAL HIGH (ref 8.9–10.3)
Chloride: 105 mmol/L (ref 98–111)
Creatinine: 1.33 mg/dL — ABNORMAL HIGH (ref 0.61–1.24)
GFR, Est AFR Am: 57 mL/min — ABNORMAL LOW (ref >60)
GFR, Estimated: 49 mL/min — ABNORMAL LOW (ref >60)
Glucose, Bld: 117 mg/dL — ABNORMAL HIGH (ref 70–99)
Potassium: 4.5 mmol/L (ref 3.5–5.1)
Sodium: 140 mmol/L (ref 135–145)
Total Bilirubin: 0.7 mg/dL (ref 0.3–1.2)
Total Protein: 6.8 g/dL (ref 6.5–8.1)

## 2018-05-28 MED ORDER — SODIUM CHLORIDE 0.9 % IV SOLN
80.0000 mg/m2 | Freq: Once | INTRAVENOUS | Status: AC
  Administered 2018-05-28: 170 mg via INTRAVENOUS
  Filled 2018-05-28: qty 8.5

## 2018-05-28 MED ORDER — PALONOSETRON HCL INJECTION 0.25 MG/5ML
INTRAVENOUS | Status: AC
  Filled 2018-05-28: qty 5

## 2018-05-28 MED ORDER — SODIUM CHLORIDE 0.9% FLUSH
10.0000 mL | INTRAVENOUS | Status: DC | PRN
  Administered 2018-05-28: 09:00:00 10 mL
  Filled 2018-05-28: qty 10

## 2018-05-28 MED ORDER — SODIUM CHLORIDE 0.9 % IV SOLN
330.0000 mg | Freq: Once | INTRAVENOUS | Status: AC
  Administered 2018-05-28: 12:00:00 330 mg via INTRAVENOUS
  Filled 2018-05-28: qty 33

## 2018-05-28 MED ORDER — ROSUVASTATIN CALCIUM 40 MG PO TABS: 40.0000 mg | ORAL_TABLET | Freq: Every day | ORAL | 30 refills | Status: DC

## 2018-05-28 MED ORDER — HEPARIN SOD (PORK) LOCK FLUSH 100 UNIT/ML IV SOLN
250.0000 [IU] | Freq: Once | INTRAVENOUS | Status: AC | PRN
  Administered 2018-05-28: 250 [IU]
  Filled 2018-05-28: qty 5

## 2018-05-28 MED ORDER — SODIUM CHLORIDE 0.9 % IV SOLN
Freq: Once | INTRAVENOUS | Status: AC
  Administered 2018-05-28: 10:00:00 via INTRAVENOUS
  Filled 2018-05-28: qty 250

## 2018-05-28 MED ORDER — FLUTICASONE-UMECLIDIN-VILANT 100-62.5-25 MCG/INH IN AEPB: 1.0000 | INHALATION_SPRAY | Freq: Every day | RESPIRATORY_TRACT | 1 refills | Status: AC

## 2018-05-28 MED ORDER — DEXAMETHASONE SODIUM PHOSPHATE 10 MG/ML IJ SOLN
INTRAMUSCULAR | Status: AC
  Filled 2018-05-28: qty 1

## 2018-05-28 MED ORDER — PALONOSETRON HCL INJECTION 0.25 MG/5ML
0.2500 mg | Freq: Once | INTRAVENOUS | Status: AC
  Administered 2018-05-28: 0.25 mg via INTRAVENOUS

## 2018-05-28 MED ORDER — HEPARIN SOD (PORK) LOCK FLUSH 100 UNIT/ML IV SOLN
500.0000 [IU] | Freq: Once | INTRAVENOUS | Status: AC | PRN
  Administered 2018-05-28: 09:00:00 500 [IU]
  Filled 2018-05-28: qty 5

## 2018-05-28 MED ORDER — DEXAMETHASONE SODIUM PHOSPHATE 10 MG/ML IJ SOLN
10.0000 mg | Freq: Once | INTRAMUSCULAR | Status: AC
  Administered 2018-05-28: 10 mg via INTRAVENOUS

## 2018-05-28 MED ORDER — SODIUM CHLORIDE 0.9% FLUSH
10.0000 mL | INTRAVENOUS | Status: DC | PRN
  Administered 2018-05-28: 13:00:00 10 mL
  Filled 2018-05-28: qty 10

## 2018-05-28 MED ORDER — FUROSEMIDE 40 MG PO TABS: 40.0000 mg | ORAL_TABLET | Freq: Every day | ORAL | 0 refills | Status: DC

## 2018-05-28 MED ORDER — SODIUM CHLORIDE 0.9 % IV SOLN
1200.0000 mg | Freq: Once | INTRAVENOUS | Status: AC
  Administered 2018-05-28: 11:00:00 1200 mg via INTRAVENOUS
  Filled 2018-05-28: qty 20

## 2018-05-28 NOTE — Progress Notes (Signed)
Wife repots that he started an Auto-immune medication unsure of the named.[name], [place]

## 2018-05-28 NOTE — Progress Notes (Signed)
Reviewed Carbo dose at MD request (over phone) due to pt SCr = 1.33.  Carbo AUC = 4 today per MD.  Norma Fredrickson dose ok at 330 mg. Kennith Center, Pharm.D., CPP 05/28/2018@10 :11 AM

## 2018-05-28 NOTE — Progress Notes (Signed)
  [facility] OFFICE PROGRESS NOTE   Diagnosis: Small cell lung cancer  INTERVAL HISTORY:   [name] completed another cycle of systemic therapy beginning 04/30/2018.  He reports tolerating chemotherapy well.  Good appetite.  No dyspnea or pain.  No complaint.  No problem with the PICC.  Objective:  Vital signs in last 24 hours:  Blood pressure 139/73, pulse 83, temperature 97.6 F (36.4 C), temperature source Oral, resp. rate 18, height _0  (1.753 m), weight 208 lb 3.2 oz (94.4 kg), SpO2 98 %.    HEENT: No thrush or ulcers Resp: Distant breath sounds, scattered end inspiratory rhonchi/rales, no respiratory distress Cardio: Regular rate and rhythm GI: No hepatomegaly Vascular: No leg edema    Portacath/PICC-without erythema  Lab Results:  Lab Results  Component Value Date   WBC 8.1 05/28/2018   HGB 9.8 (L) 05/28/2018   HCT 31.2 (L) 05/28/2018   MCV 98.7 05/28/2018   PLT 158 05/28/2018   NEUTROABS 5.3 05/28/2018    CMP  Lab Results  Component Value Date   NA 140 05/28/2018   K 4.5 05/28/2018   CL 105 05/28/2018   CO2 25 05/28/2018   GLUCOSE 117 (H) 05/28/2018   BUN 18 05/28/2018   CREATININE 1.33 (H) 05/28/2018   CALCIUM 10.4 (H) 05/28/2018   PROT 6.8 05/28/2018   ALBUMIN 3.8 05/28/2018   AST 25 05/28/2018   ALT 24 05/28/2018   ALKPHOS 64 05/28/2018   BILITOT 0.7 05/28/2018   GFRNONAA 49 (L) 05/28/2018   GFRAA 57 (L) 05/28/2018     Medications: I have reviewed the patient's current medications.   Assessment/Plan: 1. Extensive stage small cell lung cancer  CT chest 03/05/2018-left hilar mass, small mediastinal lymph nodes, atelectasis versus consolidation versus mass in the superior segment of the left lower lobe  Bone marrow biopsy 03/07/2018-extensive involvement of the bone marrow with metastatic small cell carcinoma consistent with a lung primary, small foci of non-small cell differentiation  Cycle 1 etoposide/carboplatin  03/09/2018, G-CSF started 03/12/2018 and discontinued 03/20/2018  CT head 03/21/2018-no evidence of metastatic disease  Cycle 2 etoposide/carboplatin, dose escalation of etoposide and carboplatin 04/03/2018  Cycle 3 etoposide/carboplatin with addition of atezolizumab 04/30/2018  Cycle for etoposide/carboplatin/atezolizumab 05/28/2018 2. Thrombocytopenia secondary to #1-improved 3.COPD 4.History of coronary artery disease 5.CHF 6.BPH 7.Admission with pneumonia December 2019 8.Mild renal insufficiency 9.Elevated liver enzymes 10. Coagulopathy 11. History of mild elevation of the calcium level 12. Anemia/leukopenia secondary to small cell lung cancer and chemotherapy-improved 13.  Hypokalemia-likely secondary to furosemide therapy, potassium supplement started 04/03/2018 14.  Admission 04/19/2018 with left lung pneumonia   Disposition: Joseph Berg appears well.  He has completed 3 cycles of systemic therapy for treatment of extensive stage small cell lung cancer.  He has tolerated the treatment well.  There is no evidence of disease progression.  He will complete cycle 4 today.  He will undergo a restaging CT of the chest prior to an office visit in 4 weeks.  He continues to have PICC care at the Cancer center.  We will check a nadir CBC next week.  The carboplatin will be dose adjusted today for renal insufficiency.   I contacted his wife by telephone to review his status and treatment plan.  [name], MD  05/28/2018  9:12 AM

## 2018-05-28 NOTE — Telephone Encounter (Signed)
Scheduled appt per 4/20 los.

## 2018-05-28 NOTE — Progress Notes (Signed)
Virtual Visit via Video Note  I connected with Joseph Berg on 05/28/18 at  3:00 PM EDT by video and verified that I am speaking with the correct person using two identifiers.   I discussed the limitations, risks, security and privacy concerns of performing an evaluation and management service by telephone and the availability of in person appointments. I also discussed with the patient that there may be a patient responsible charge related to this service. The patient expressed understanding and agreed to proceed.  Patient was at home sitting on his couch and I was in my office for the visit.   Subjective:    CC: Follow-up COPD and heart failure.  HPI:  85 year old male is currently undergoing treatment for small cell lung cancer.  In fact he was seen by Dr. Learta Codding today and actually had an infusion treatment.  He says overall he is doing well.  He admits he has been eating more and been a little bit more sedentary and has gained some weight.  Heart failure-he has a follow-up in June with cardiology he denies any increased fluid or volume.  In fact he says the swelling around his ankles is actually pretty good right now.  He says he has not been wearing the compression stockings that we had ordered because they are too hard to get on.  He is taking his Lasix daily and needs a refill on that.  COPD-he denies any recent flares or exacerbations he is using his Trelegy daily and will need refills on that medication as well.  He is doing well on his albuterol.  Obstructive sleep apnea-he did let me know that he quit using his CPAP and in fact the company came back and picked it up.  It also recently had an episode of pneumonia and was hospitalized in early March.  He says he is doing well and has not needed the oxygen in quite some time and would like the home health company to actually come and pick it back up.   Past medical history, Surgical history, Family history not pertinant  except as noted below, Social history, Allergies, and medications have been entered into the medical record, reviewed, and corrections made.   Review of Systems: No fevers, chills, night sweats, weight loss, chest pain, or shortness of breath.   Objective:    General: Speaking clearly in complete sentences without any shortness of breath.  Alert and oriented x3.  Normal judgment. No apparent acute distress.    Impression and Recommendations:    COPD -stable.  No recent flares or exacerbations.  Continue current regimen with Trelegy.  Refill sent to pharmacy.  He says is doing well on his albuterol.  Combined chronic and systolic heart failure. Has f/U CArdiology in June.  Overall stable.  He says he cannot wear the compression stockings because they are too tight.  If he starts to notice any increase in swelling please let us know.  In our continue with daily Lasix I did go ahead and refill it once until he is able to get in with cardiology.  Tob abuse - he quit smoking.  Graduated him on this!  OSA - he is not using his CPAP.  In fact the [facility] home [facility] came and actually picked up the equipment because he was not using it.  Lower extremity edema-much improved and actually well controlled he has not been wearing his compression stockings.  Pneumonia -resolved.  We will send an order to home health  company to come pick up his oxygen.   I discussed the assessment and treatment plan with the patient. The patient was provided an opportunity to ask questions and all were answered. The patient agreed with the plan and demonstrated an understanding of the instructions.   The patient was advised to call back or seek an in-person evaluation if the symptoms worsen or if the condition fails to improve as anticipated.   Beatrice Lecher, MD

## 2018-05-28 NOTE — Progress Notes (Signed)
Per Dr. Benay Spice: OK to treat with creatinine level today 1.3. Will dose reduce the carboplatin today. Will also reschedule his Neulasta for Friday, 06/01/18 to avoid unnecessary visit to office.

## 2018-05-28 NOTE — Patient Instructions (Signed)
[facility] Discharge Instructions for Patients Receiving Chemotherapy  Today you received the following chemotherapy agents Tecentriq, Etoposide and Carboplatin. To help prevent nausea and vomiting after your treatment, we encourage you to take your nausea medication as directed.   If you develop nausea and vomiting that is not controlled by your nausea medication, call the clinic.   BELOW ARE SYMPTOMS THAT SHOULD BE REPORTED IMMEDIATELY:  *FEVER GREATER THAN 100.5 F  *CHILLS WITH OR WITHOUT FEVER  NAUSEA AND VOMITING THAT IS NOT CONTROLLED WITH YOUR NAUSEA MEDICATION  *UNUSUAL SHORTNESS OF BREATH  *UNUSUAL BRUISING OR BLEEDING  TENDERNESS IN MOUTH AND THROAT WITH OR WITHOUT PRESENCE OF ULCERS  *URINARY PROBLEMS  *BOWEL PROBLEMS  UNUSUAL RASH Items with * indicate a potential emergency and should be followed up as soon as possible.  Feel free to call the clinic you have any questions or concerns. The clinic phone number is [phone].  Please show the [facility] at check-in to the Emergency Department and triage nurse.  Coronavirus (COVID-19) Are you at risk?  Are you at risk for the Coronavirus (COVID-19)?  To be considered HIGH RISK for Coronavirus (COVID-19), you have to meet the following criteria:  . Traveled to [place], [place], [place], [place] or [place]; or in the [place] to [place], [place], [place], or [place]; and have fever, cough, and shortness of breath within the last 2 weeks of travel OR . Been in close contact with a person diagnosed with COVID-19 within the last 2 weeks and have fever, cough, and shortness of breath . IF YOU DO NOT MEET THESE CRITERIA, YOU ARE CONSIDERED LOW RISK FOR COVID-19.  What to do if you are HIGH RISK for COVID-19?  [name] If you are having a medical emergency, call 911. . Seek medical care right away. Before you go to a doctor's office, urgent care or emergency department, call ahead and tell  them about your recent travel, contact with someone diagnosed with COVID-19, and your symptoms. You should receive instructions from your physician's office regarding next steps of care.  . When you arrive at healthcare provider, tell the healthcare staff immediately you have returned from visiting [place], [place], [place], [place] or [place]; or traveled in the [place] to [place], [place], [place], or [place]; in the last two weeks or you have been in close contact with a person diagnosed with COVID-19 in the last 2 weeks.   . Tell the health care staff about your symptoms: fever, cough and shortness of breath. . After you have been seen by a medical provider, you will be either: o Tested for (COVID-19) and discharged home on quarantine except to seek medical care if symptoms worsen, and asked to  - Stay home and avoid contact with others until you get your results (4-5 days)  - Avoid travel on public transportation if possible (such as bus, train, or airplane) or o Sent to the Emergency Department by EMS for evaluation, COVID-19 testing, and possible admission depending on your condition and test results.  What to do if you are LOW RISK for COVID-19?  Reduce your risk of any infection by using the same precautions used for avoiding the common cold or flu:  [name] Wash your hands often with soap and warm water for at least 20 seconds.  If soap and water are not readily available, use an alcohol-based hand sanitizer with at least 60% alcohol.  . If coughing or  sneezing, cover your mouth and nose by coughing or sneezing into the elbow areas of your shirt or coat, into a tissue or into your sleeve (not your hands). . Avoid shaking hands with others and consider head nods or verbal greetings only. . Avoid touching your eyes, nose, or mouth with unwashed hands.  . Avoid close contact with people who are sick. . Avoid places or events with large numbers of people in one location, like concerts or  sporting events. . Carefully consider travel plans you have or are making. . If you are planning any travel outside or inside the [place], visit the CDC's Travelers' Health webpage for the latest health notices. . If you have some symptoms but not all symptoms, continue to monitor at home and seek medical attention if your symptoms worsen. . If you are having a medical emergency, call 911.   [place] / e-Visit: [email or web address]         [facility]: [facility]: [phone]                   [facility]: [phone]

## 2018-05-29 ENCOUNTER — Inpatient Hospital Stay: Payer: Medicare PPO

## 2018-05-29 ENCOUNTER — Other Ambulatory Visit: Payer: Self-pay

## 2018-05-29 VITALS — BP 137/82 | HR 80 | Temp 98.3°F | Resp 18

## 2018-05-29 DIAGNOSIS — J439 Emphysema, unspecified: Secondary | ICD-10-CM | POA: Diagnosis not present

## 2018-05-29 DIAGNOSIS — Z5111 Encounter for antineoplastic chemotherapy: Secondary | ICD-10-CM | POA: Diagnosis not present

## 2018-05-29 DIAGNOSIS — Z5189 Encounter for other specified aftercare: Secondary | ICD-10-CM | POA: Diagnosis not present

## 2018-05-29 DIAGNOSIS — C3432 Malignant neoplasm of lower lobe, left bronchus or lung: Secondary | ICD-10-CM | POA: Diagnosis not present

## 2018-05-29 DIAGNOSIS — Z5112 Encounter for antineoplastic immunotherapy: Secondary | ICD-10-CM | POA: Diagnosis not present

## 2018-05-29 DIAGNOSIS — J449 Chronic obstructive pulmonary disease, unspecified: Secondary | ICD-10-CM | POA: Diagnosis not present

## 2018-05-29 DIAGNOSIS — C7952 Secondary malignant neoplasm of bone marrow: Secondary | ICD-10-CM | POA: Diagnosis not present

## 2018-05-29 DIAGNOSIS — I5022 Chronic systolic (congestive) heart failure: Secondary | ICD-10-CM | POA: Diagnosis not present

## 2018-05-29 DIAGNOSIS — Z452 Encounter for adjustment and management of vascular access device: Secondary | ICD-10-CM | POA: Diagnosis not present

## 2018-05-29 DIAGNOSIS — C3490 Malignant neoplasm of unspecified part of unspecified bronchus or lung: Secondary | ICD-10-CM | POA: Diagnosis not present

## 2018-05-29 DIAGNOSIS — C801 Malignant (primary) neoplasm, unspecified: Secondary | ICD-10-CM

## 2018-05-29 MED ORDER — HEPARIN SOD (PORK) LOCK FLUSH 100 UNIT/ML IV SOLN
250.0000 [IU] | Freq: Once | INTRAVENOUS | Status: AC | PRN
  Administered 2018-05-29: 250 [IU]
  Filled 2018-05-29: qty 5

## 2018-05-29 MED ORDER — SODIUM CHLORIDE 0.9 % IV SOLN
80.0000 mg/m2 | Freq: Once | INTRAVENOUS | Status: AC
  Administered 2018-05-29: 170 mg via INTRAVENOUS
  Filled 2018-05-29: qty 8.5

## 2018-05-29 MED ORDER — DEXAMETHASONE SODIUM PHOSPHATE 10 MG/ML IJ SOLN
10.0000 mg | Freq: Once | INTRAMUSCULAR | Status: AC
  Administered 2018-05-29: 10 mg via INTRAVENOUS

## 2018-05-29 MED ORDER — SODIUM CHLORIDE 0.9% FLUSH
10.0000 mL | INTRAVENOUS | Status: DC | PRN
  Administered 2018-05-29: 16:00:00 10 mL
  Filled 2018-05-29: qty 10

## 2018-05-29 MED ORDER — DEXAMETHASONE SODIUM PHOSPHATE 10 MG/ML IJ SOLN
INTRAMUSCULAR | Status: AC
  Filled 2018-05-29: qty 1

## 2018-05-29 MED ORDER — SODIUM CHLORIDE 0.9 % IV SOLN
Freq: Once | INTRAVENOUS | Status: AC
  Administered 2018-05-29: 14:00:00 via INTRAVENOUS
  Filled 2018-05-29: qty 250

## 2018-05-29 NOTE — Patient Instructions (Signed)
[facility] Discharge Instructions for Patients Receiving Chemotherapy  Today you received the following chemotherapy agents Etoposide.  To help prevent nausea and vomiting after your treatment, we encourage you to take your nausea medication as directed.   If you develop nausea and vomiting that is not controlled by your nausea medication, call the clinic.   BELOW ARE SYMPTOMS THAT SHOULD BE REPORTED IMMEDIATELY:  *FEVER GREATER THAN 100.5 F  *CHILLS WITH OR WITHOUT FEVER  NAUSEA AND VOMITING THAT IS NOT CONTROLLED WITH YOUR NAUSEA MEDICATION  *UNUSUAL SHORTNESS OF BREATH  *UNUSUAL BRUISING OR BLEEDING  TENDERNESS IN MOUTH AND THROAT WITH OR WITHOUT PRESENCE OF ULCERS  *URINARY PROBLEMS  *BOWEL PROBLEMS  UNUSUAL RASH Items with * indicate a potential emergency and should be followed up as soon as possible.  Feel free to call the clinic you have any questions or concerns. The clinic phone number is [phone].  Please show the [facility] at check-in to the Emergency Department and triage nurse.  Coronavirus (COVID-19) Are you at risk?  Are you at risk for the Coronavirus (COVID-19)?  To be considered HIGH RISK for Coronavirus (COVID-19), you have to meet the following criteria:  . Traveled to [place], [place], [place], [place] or [place]; or in the [place] to [place], [place], [place], or [place]; and have fever, cough, and shortness of breath within the last 2 weeks of travel OR . Been in close contact with a person diagnosed with COVID-19 within the last 2 weeks and have fever, cough, and shortness of breath . IF YOU DO NOT MEET THESE CRITERIA, YOU ARE CONSIDERED LOW RISK FOR COVID-19.  What to do if you are HIGH RISK for COVID-19?  [name] If you are having a medical emergency, call 911. . Seek medical care right away. Before you go to a doctor's office, urgent care or emergency department, call ahead and tell them about your recent  travel, contact with someone diagnosed with COVID-19, and your symptoms. You should receive instructions from your physician's office regarding next steps of care.  . When you arrive at healthcare provider, tell the healthcare staff immediately you have returned from visiting [place], [place], [place], [place] or [place]; or traveled in the [place] to [place], [place], [place], or [place]; in the last two weeks or you have been in close contact with a person diagnosed with COVID-19 in the last 2 weeks.   . Tell the health care staff about your symptoms: fever, cough and shortness of breath. . After you have been seen by a medical provider, you will be either: o Tested for (COVID-19) and discharged home on quarantine except to seek medical care if symptoms worsen, and asked to  - Stay home and avoid contact with others until you get your results (4-5 days)  - Avoid travel on public transportation if possible (such as bus, train, or airplane) or o Sent to the Emergency Department by EMS for evaluation, COVID-19 testing, and possible admission depending on your condition and test results.  What to do if you are LOW RISK for COVID-19?  Reduce your risk of any infection by using the same precautions used for avoiding the common cold or flu:  [name] Wash your hands often with soap and warm water for at least 20 seconds.  If soap and water are not readily available, use an alcohol-based hand sanitizer with at least 60% alcohol.  . If coughing or sneezing, cover  your mouth and nose by coughing or sneezing into the elbow areas of your shirt or coat, into a tissue or into your sleeve (not your hands). . Avoid shaking hands with others and consider head nods or verbal greetings only. . Avoid touching your eyes, nose, or mouth with unwashed hands.  . Avoid close contact with people who are sick. . Avoid places or events with large numbers of people in one location, like concerts or sporting  events. . Carefully consider travel plans you have or are making. . If you are planning any travel outside or inside the [place], visit the CDC's Travelers' Health webpage for the latest health notices. . If you have some symptoms but not all symptoms, continue to monitor at home and seek medical attention if your symptoms worsen. . If you are having a medical emergency, call 911.   [place] / e-Visit: [email or web address]         [facility]: [facility]: [phone]                   [facility]: [phone]

## 2018-05-30 ENCOUNTER — Other Ambulatory Visit: Payer: Self-pay

## 2018-05-30 ENCOUNTER — Inpatient Hospital Stay: Payer: Medicare PPO

## 2018-05-30 VITALS — BP 139/66 | HR 88 | Temp 97.4°F | Resp 20

## 2018-05-30 DIAGNOSIS — Z5112 Encounter for antineoplastic immunotherapy: Secondary | ICD-10-CM | POA: Diagnosis not present

## 2018-05-30 DIAGNOSIS — Z5111 Encounter for antineoplastic chemotherapy: Secondary | ICD-10-CM | POA: Diagnosis not present

## 2018-05-30 DIAGNOSIS — C7952 Secondary malignant neoplasm of bone marrow: Secondary | ICD-10-CM | POA: Diagnosis not present

## 2018-05-30 DIAGNOSIS — C801 Malignant (primary) neoplasm, unspecified: Secondary | ICD-10-CM

## 2018-05-30 DIAGNOSIS — Z452 Encounter for adjustment and management of vascular access device: Secondary | ICD-10-CM | POA: Diagnosis not present

## 2018-05-30 DIAGNOSIS — C3432 Malignant neoplasm of lower lobe, left bronchus or lung: Secondary | ICD-10-CM | POA: Diagnosis not present

## 2018-05-30 DIAGNOSIS — Z5189 Encounter for other specified aftercare: Secondary | ICD-10-CM | POA: Diagnosis not present

## 2018-05-30 MED ORDER — DEXAMETHASONE SODIUM PHOSPHATE 10 MG/ML IJ SOLN
10.0000 mg | Freq: Once | INTRAMUSCULAR | Status: AC
  Administered 2018-05-30: 10 mg via INTRAVENOUS

## 2018-05-30 MED ORDER — DEXAMETHASONE SODIUM PHOSPHATE 10 MG/ML IJ SOLN
INTRAMUSCULAR | Status: AC
  Filled 2018-05-30: qty 1

## 2018-05-30 MED ORDER — HEPARIN SOD (PORK) LOCK FLUSH 100 UNIT/ML IV SOLN
250.0000 [IU] | Freq: Once | INTRAVENOUS | Status: AC | PRN
  Administered 2018-05-30: 250 [IU]
  Filled 2018-05-30: qty 5

## 2018-05-30 MED ORDER — SODIUM CHLORIDE 0.9% FLUSH
10.0000 mL | INTRAVENOUS | Status: DC | PRN
  Administered 2018-05-30: 10 mL
  Filled 2018-05-30: qty 10

## 2018-05-30 MED ORDER — SODIUM CHLORIDE 0.9 % IV SOLN
Freq: Once | INTRAVENOUS | Status: AC
  Administered 2018-05-30: 14:00:00 via INTRAVENOUS
  Filled 2018-05-30: qty 250

## 2018-05-30 MED ORDER — SODIUM CHLORIDE 0.9 % IV SOLN
80.0000 mg/m2 | Freq: Once | INTRAVENOUS | Status: AC
  Administered 2018-05-30: 170 mg via INTRAVENOUS
  Filled 2018-05-30: qty 8.5

## 2018-05-30 NOTE — Patient Instructions (Signed)
[facility] Discharge Instructions for Patients Receiving Chemotherapy  Today you received the following chemotherapy agents Etoposide.  To help prevent nausea and vomiting after your treatment, we encourage you to take your nausea medication as directed.   If you develop nausea and vomiting that is not controlled by your nausea medication, call the clinic.   BELOW ARE SYMPTOMS THAT SHOULD BE REPORTED IMMEDIATELY:  *FEVER GREATER THAN 100.5 F  *CHILLS WITH OR WITHOUT FEVER  NAUSEA AND VOMITING THAT IS NOT CONTROLLED WITH YOUR NAUSEA MEDICATION  *UNUSUAL SHORTNESS OF BREATH  *UNUSUAL BRUISING OR BLEEDING  TENDERNESS IN MOUTH AND THROAT WITH OR WITHOUT PRESENCE OF ULCERS  *URINARY PROBLEMS  *BOWEL PROBLEMS  UNUSUAL RASH Items with * indicate a potential emergency and should be followed up as soon as possible.  Feel free to call the clinic you have any questions or concerns. The clinic phone number is [phone].  Please show the [facility] at check-in to the Emergency Department and triage nurse.  Coronavirus (COVID-19) Are you at risk?  Are you at risk for the Coronavirus (COVID-19)?  To be considered HIGH RISK for Coronavirus (COVID-19), you have to meet the following criteria:  . Traveled to [place], [place], [place], [place] or [place]; or in the [place] to [place], [place], [place], or [place]; and have fever, cough, and shortness of breath within the last 2 weeks of travel OR . Been in close contact with a person diagnosed with COVID-19 within the last 2 weeks and have fever, cough, and shortness of breath . IF YOU DO NOT MEET THESE CRITERIA, YOU ARE CONSIDERED LOW RISK FOR COVID-19.  What to do if you are HIGH RISK for COVID-19?  [name] If you are having a medical emergency, call 911. . Seek medical care right away. Before you go to a doctor's office, urgent care or emergency department, call ahead and tell them about your recent  travel, contact with someone diagnosed with COVID-19, and your symptoms. You should receive instructions from your physician's office regarding next steps of care.  . When you arrive at healthcare provider, tell the healthcare staff immediately you have returned from visiting [place], [place], [place], [place] or [place]; or traveled in the [place] to [place], [place], [place], or [place]; in the last two weeks or you have been in close contact with a person diagnosed with COVID-19 in the last 2 weeks.   . Tell the health care staff about your symptoms: fever, cough and shortness of breath. . After you have been seen by a medical provider, you will be either: o Tested for (COVID-19) and discharged home on quarantine except to seek medical care if symptoms worsen, and asked to  - Stay home and avoid contact with others until you get your results (4-5 days)  - Avoid travel on public transportation if possible (such as bus, train, or airplane) or o Sent to the Emergency Department by EMS for evaluation, COVID-19 testing, and possible admission depending on your condition and test results.  What to do if you are LOW RISK for COVID-19?  Reduce your risk of any infection by using the same precautions used for avoiding the common cold or flu:  [name] Wash your hands often with soap and warm water for at least 20 seconds.  If soap and water are not readily available, use an alcohol-based hand sanitizer with at least 60% alcohol.  . If coughing or sneezing, cover  your mouth and nose by coughing or sneezing into the elbow areas of your shirt or coat, into a tissue or into your sleeve (not your hands). . Avoid shaking hands with others and consider head nods or verbal greetings only. . Avoid touching your eyes, nose, or mouth with unwashed hands.  . Avoid close contact with people who are sick. . Avoid places or events with large numbers of people in one location, like concerts or sporting  events. . Carefully consider travel plans you have or are making. . If you are planning any travel outside or inside the [place], visit the CDC's Travelers' Health webpage for the latest health notices. . If you have some symptoms but not all symptoms, continue to monitor at home and seek medical attention if your symptoms worsen. . If you are having a medical emergency, call 911.   [place] / e-Visit: [email or web address]         [facility]: [facility]: [phone]                   [facility]: [phone]

## 2018-05-31 ENCOUNTER — Ambulatory Visit: Payer: Medicare PPO

## 2018-06-01 ENCOUNTER — Telehealth: Payer: Self-pay | Admitting: *Deleted

## 2018-06-01 ENCOUNTER — Ambulatory Visit: Payer: Medicare PPO

## 2018-06-01 NOTE — Telephone Encounter (Signed)
FYI "LATIF NAZARENO daughter Maxie Slovacek [phone]).  He's scheduled 2:30 pm today for P.I.C.C flush.  Had a two diarrhea stools.  Says he wants to reschedule.  Forgot but will use the liquid Imodium now."  He was treated Wednesday 05-30-2018.  Scheduling notified rescheduled tomorrow at 10:00 am for flush, dressing change if needed and injection.

## 2018-06-02 ENCOUNTER — Other Ambulatory Visit: Payer: Self-pay

## 2018-06-02 ENCOUNTER — Inpatient Hospital Stay: Payer: Medicare PPO

## 2018-06-02 ENCOUNTER — Ambulatory Visit: Payer: Medicare PPO

## 2018-06-02 VITALS — BP 144/84 | HR 78 | Temp 98.4°F | Resp 20

## 2018-06-02 DIAGNOSIS — Z452 Encounter for adjustment and management of vascular access device: Secondary | ICD-10-CM | POA: Diagnosis not present

## 2018-06-02 DIAGNOSIS — C7952 Secondary malignant neoplasm of bone marrow: Secondary | ICD-10-CM | POA: Diagnosis not present

## 2018-06-02 DIAGNOSIS — Z5189 Encounter for other specified aftercare: Secondary | ICD-10-CM | POA: Diagnosis not present

## 2018-06-02 DIAGNOSIS — C3432 Malignant neoplasm of lower lobe, left bronchus or lung: Secondary | ICD-10-CM | POA: Diagnosis not present

## 2018-06-02 DIAGNOSIS — Z5112 Encounter for antineoplastic immunotherapy: Secondary | ICD-10-CM | POA: Diagnosis not present

## 2018-06-02 DIAGNOSIS — Z5111 Encounter for antineoplastic chemotherapy: Secondary | ICD-10-CM | POA: Diagnosis not present

## 2018-06-02 MED ORDER — HEPARIN SOD (PORK) LOCK FLUSH 100 UNIT/ML IV SOLN
500.0000 [IU] | Freq: Once | INTRAVENOUS | Status: AC | PRN
  Administered 2018-06-02: 250 [IU]
  Filled 2018-06-02: qty 5

## 2018-06-02 MED ORDER — PEGFILGRASTIM-CBQV 6 MG/0.6ML ~~LOC~~ SOSY
6.0000 mg | PREFILLED_SYRINGE | Freq: Once | SUBCUTANEOUS | Status: AC
  Administered 2018-06-02: 6 mg via SUBCUTANEOUS

## 2018-06-02 MED ORDER — PEGFILGRASTIM-CBQV 6 MG/0.6ML ~~LOC~~ SOSY
PREFILLED_SYRINGE | SUBCUTANEOUS | Status: AC
  Filled 2018-06-02: qty 0.6

## 2018-06-02 MED ORDER — SODIUM CHLORIDE 0.9% FLUSH
10.0000 mL | INTRAVENOUS | Status: DC | PRN
  Administered 2018-06-02: 10 mL
  Filled 2018-06-02: qty 10

## 2018-06-04 ENCOUNTER — Other Ambulatory Visit: Payer: Self-pay

## 2018-06-04 ENCOUNTER — Inpatient Hospital Stay: Payer: Medicare PPO

## 2018-06-04 DIAGNOSIS — Z5111 Encounter for antineoplastic chemotherapy: Secondary | ICD-10-CM | POA: Diagnosis not present

## 2018-06-04 DIAGNOSIS — C7952 Secondary malignant neoplasm of bone marrow: Secondary | ICD-10-CM | POA: Diagnosis not present

## 2018-06-04 DIAGNOSIS — Z5189 Encounter for other specified aftercare: Secondary | ICD-10-CM | POA: Diagnosis not present

## 2018-06-04 DIAGNOSIS — C3432 Malignant neoplasm of lower lobe, left bronchus or lung: Secondary | ICD-10-CM | POA: Diagnosis not present

## 2018-06-04 DIAGNOSIS — Z95828 Presence of other vascular implants and grafts: Secondary | ICD-10-CM

## 2018-06-04 DIAGNOSIS — Z452 Encounter for adjustment and management of vascular access device: Secondary | ICD-10-CM | POA: Diagnosis not present

## 2018-06-04 DIAGNOSIS — Z5112 Encounter for antineoplastic immunotherapy: Secondary | ICD-10-CM | POA: Diagnosis not present

## 2018-06-04 MED ORDER — HEPARIN SOD (PORK) LOCK FLUSH 100 UNIT/ML IV SOLN
500.0000 [IU] | Freq: Once | INTRAVENOUS | Status: AC | PRN
  Administered 2018-06-04: 14:00:00 500 [IU]
  Filled 2018-06-04: qty 5

## 2018-06-04 MED ORDER — SODIUM CHLORIDE 0.9% FLUSH
10.0000 mL | INTRAVENOUS | Status: DC | PRN
  Administered 2018-06-04: 10 mL
  Filled 2018-06-04: qty 10

## 2018-06-06 ENCOUNTER — Other Ambulatory Visit: Payer: Self-pay

## 2018-06-06 ENCOUNTER — Inpatient Hospital Stay: Payer: Medicare PPO

## 2018-06-06 DIAGNOSIS — Z5112 Encounter for antineoplastic immunotherapy: Secondary | ICD-10-CM | POA: Diagnosis not present

## 2018-06-06 DIAGNOSIS — Z5189 Encounter for other specified aftercare: Secondary | ICD-10-CM | POA: Diagnosis not present

## 2018-06-06 DIAGNOSIS — C3432 Malignant neoplasm of lower lobe, left bronchus or lung: Secondary | ICD-10-CM | POA: Diagnosis not present

## 2018-06-06 DIAGNOSIS — Z452 Encounter for adjustment and management of vascular access device: Secondary | ICD-10-CM | POA: Diagnosis not present

## 2018-06-06 DIAGNOSIS — Z5111 Encounter for antineoplastic chemotherapy: Secondary | ICD-10-CM | POA: Diagnosis not present

## 2018-06-06 DIAGNOSIS — Z95828 Presence of other vascular implants and grafts: Secondary | ICD-10-CM

## 2018-06-06 DIAGNOSIS — C7952 Secondary malignant neoplasm of bone marrow: Secondary | ICD-10-CM | POA: Diagnosis not present

## 2018-06-06 MED ORDER — HEPARIN SOD (PORK) LOCK FLUSH 100 UNIT/ML IV SOLN
250.0000 [IU] | Freq: Once | INTRAVENOUS | Status: AC
  Administered 2018-06-06: 14:00:00 250 [IU]
  Filled 2018-06-06: qty 5

## 2018-06-06 MED ORDER — SODIUM CHLORIDE 0.9% FLUSH
10.0000 mL | INTRAVENOUS | Status: DC | PRN
  Administered 2018-06-06: 14:00:00 10 mL
  Filled 2018-06-06: qty 10

## 2018-06-08 ENCOUNTER — Other Ambulatory Visit: Payer: Self-pay

## 2018-06-08 ENCOUNTER — Telehealth: Payer: Self-pay | Admitting: *Deleted

## 2018-06-08 ENCOUNTER — Inpatient Hospital Stay: Payer: Medicare PPO | Attending: Oncology

## 2018-06-08 ENCOUNTER — Inpatient Hospital Stay: Payer: Medicare PPO

## 2018-06-08 DIAGNOSIS — Z95828 Presence of other vascular implants and grafts: Secondary | ICD-10-CM

## 2018-06-08 DIAGNOSIS — C3490 Malignant neoplasm of unspecified part of unspecified bronchus or lung: Secondary | ICD-10-CM

## 2018-06-08 DIAGNOSIS — Z79899 Other long term (current) drug therapy: Secondary | ICD-10-CM | POA: Diagnosis not present

## 2018-06-08 DIAGNOSIS — C7952 Secondary malignant neoplasm of bone marrow: Secondary | ICD-10-CM | POA: Diagnosis not present

## 2018-06-08 DIAGNOSIS — Z452 Encounter for adjustment and management of vascular access device: Secondary | ICD-10-CM | POA: Diagnosis not present

## 2018-06-08 DIAGNOSIS — Z5112 Encounter for antineoplastic immunotherapy: Secondary | ICD-10-CM | POA: Insufficient documentation

## 2018-06-08 DIAGNOSIS — C801 Malignant (primary) neoplasm, unspecified: Secondary | ICD-10-CM

## 2018-06-08 DIAGNOSIS — C3432 Malignant neoplasm of lower lobe, left bronchus or lung: Secondary | ICD-10-CM | POA: Diagnosis not present

## 2018-06-08 LAB — CBC WITH DIFFERENTIAL (CANCER CENTER ONLY)
Abs Immature Granulocytes: 0.06 10*3/uL (ref 0.00–0.07)
Basophils Absolute: 0 10*3/uL (ref 0.0–0.1)
Basophils Relative: 0 %
Eosinophils Absolute: 0.1 10*3/uL (ref 0.0–0.5)
Eosinophils Relative: 1 %
HCT: 28 % — ABNORMAL LOW (ref 39.0–52.0)
Hemoglobin: 8.9 g/dL — ABNORMAL LOW (ref 13.0–17.0)
Immature Granulocytes: 1 %
Lymphocytes Relative: 38 %
Lymphs Abs: 1.7 10*3/uL (ref 0.7–4.0)
MCH: 31.3 pg (ref 26.0–34.0)
MCHC: 31.8 g/dL (ref 30.0–36.0)
MCV: 98.6 fL (ref 80.0–100.0)
Monocytes Absolute: 0.5 10*3/uL (ref 0.1–1.0)
Monocytes Relative: 12 %
Neutro Abs: 2.1 10*3/uL (ref 1.7–7.7)
Neutrophils Relative %: 48 %
Platelet Count: 59 10*3/uL — ABNORMAL LOW (ref 150–400)
RBC: 2.84 MIL/uL — ABNORMAL LOW (ref 4.22–5.81)
RDW: 17.9 % — ABNORMAL HIGH (ref 11.5–15.5)
WBC Count: 4.5 10*3/uL (ref 4.0–10.5)
nRBC: 0.7 % — ABNORMAL HIGH (ref 0.0–0.2)

## 2018-06-08 LAB — BASIC METABOLIC PANEL - CANCER CENTER ONLY
Anion gap: 13 (ref 5–15)
BUN: 17 mg/dL (ref 8–23)
CO2: 24 mmol/L (ref 22–32)
Calcium: 10.8 mg/dL — ABNORMAL HIGH (ref 8.9–10.3)
Chloride: 103 mmol/L (ref 98–111)
Creatinine: 0.98 mg/dL (ref 0.61–1.24)
GFR, Est AFR Am: >60 mL/min (ref >60)
GFR, Estimated: >60 mL/min (ref >60)
Glucose, Bld: 88 mg/dL (ref 70–99)
Potassium: 4.4 mmol/L (ref 3.5–5.1)
Sodium: 140 mmol/L (ref 135–145)

## 2018-06-08 MED ORDER — SODIUM CHLORIDE 0.9% FLUSH
10.0000 mL | INTRAVENOUS | Status: DC | PRN
  Administered 2018-06-08: 10 mL
  Filled 2018-06-08: qty 10

## 2018-06-08 MED ORDER — HEPARIN SOD (PORK) LOCK FLUSH 100 UNIT/ML IV SOLN
500.0000 [IU] | Freq: Once | INTRAVENOUS | Status: AC | PRN
  Administered 2018-06-08: 500 [IU]
  Filled 2018-06-08: qty 5

## 2018-06-08 NOTE — Telephone Encounter (Signed)
Telephone call to patient to discuss lab results from today. Per Dr. Benay Spice patient is to stop taking calcium supplements. Patient verbalized an understanding. Recheck lab on Monday with picc flush. Patient confirms appointment.

## 2018-06-11 ENCOUNTER — Inpatient Hospital Stay: Payer: Medicare PPO

## 2018-06-11 ENCOUNTER — Telehealth: Payer: Self-pay | Admitting: *Deleted

## 2018-06-11 ENCOUNTER — Other Ambulatory Visit: Payer: Self-pay

## 2018-06-11 DIAGNOSIS — Z95828 Presence of other vascular implants and grafts: Secondary | ICD-10-CM

## 2018-06-11 DIAGNOSIS — Z79899 Other long term (current) drug therapy: Secondary | ICD-10-CM | POA: Diagnosis not present

## 2018-06-11 DIAGNOSIS — C801 Malignant (primary) neoplasm, unspecified: Secondary | ICD-10-CM

## 2018-06-11 DIAGNOSIS — Z5112 Encounter for antineoplastic immunotherapy: Secondary | ICD-10-CM | POA: Diagnosis not present

## 2018-06-11 DIAGNOSIS — C7952 Secondary malignant neoplasm of bone marrow: Secondary | ICD-10-CM | POA: Diagnosis not present

## 2018-06-11 DIAGNOSIS — C3432 Malignant neoplasm of lower lobe, left bronchus or lung: Secondary | ICD-10-CM | POA: Diagnosis not present

## 2018-06-11 DIAGNOSIS — Z452 Encounter for adjustment and management of vascular access device: Secondary | ICD-10-CM | POA: Diagnosis not present

## 2018-06-11 LAB — BASIC METABOLIC PANEL - CANCER CENTER ONLY
Anion gap: 13 (ref 5–15)
BUN: 14 mg/dL (ref 8–23)
CO2: 28 mmol/L (ref 22–32)
Calcium: 10.3 mg/dL (ref 8.9–10.3)
Chloride: 104 mmol/L (ref 98–111)
Creatinine: 1.1 mg/dL (ref 0.61–1.24)
GFR, Est AFR Am: >60 mL/min (ref >60)
GFR, Estimated: >60 mL/min (ref >60)
Glucose, Bld: 100 mg/dL — ABNORMAL HIGH (ref 70–99)
Potassium: 4.1 mmol/L (ref 3.5–5.1)
Sodium: 145 mmol/L (ref 135–145)

## 2018-06-11 MED ORDER — SODIUM CHLORIDE 0.9% FLUSH
10.0000 mL | INTRAVENOUS | Status: DC | PRN
  Administered 2018-06-11: 10 mL
  Filled 2018-06-11: qty 10

## 2018-06-11 MED ORDER — HEPARIN SOD (PORK) LOCK FLUSH 100 UNIT/ML IV SOLN
500.0000 [IU] | Freq: Once | INTRAVENOUS | Status: AC | PRN
  Administered 2018-06-11: 250 [IU]
  Filled 2018-06-11: qty 5

## 2018-06-11 NOTE — Patient Instructions (Signed)
PICC Home Care Guide ° °A peripherally inserted central catheter (PICC) is a form of IV access that allows medicines and IV fluids to be quickly distributed throughout the body. The PICC is a long, thin, flexible tube (catheter) that is inserted into a vein in the upper arm. The catheter ends in a large vein in the chest (superior vena cava, or SVC). After the PICC is inserted, a chest X-ray may be done to make sure that it is in the correct place. °A PICC may be placed for different reasons, such as: °· To give medicines and liquid nutrition. °· To give IV fluids and blood products. °· If there is trouble placing a peripheral intravenous (PIV) catheter. °If taken care of properly, a PICC can remain in place for several months. Having a PICC can also allow a person to go home from the hospital sooner. Medicine and PICC care can be managed at home by a family member, caregiver, or home health care team. °What are the risks? °Generally, having a PICC is safe. However, problems may occur, including: °· A blood clot (thrombus) forming in or at the tip of the PICC. °· A blood clot forming in a vein (deep vein thrombosis) or traveling to the lung (pulmonary embolism). °· Inflammation of the vein (phlebitis) in which the PICC is placed. °· Infection. Central line associated blood stream infection (CLABSI) is a serious infection that often requires hospitalization. °· PICC movement (malposition). The PICC tip may move from its original position due to excessive physical activity, forceful coughing, sneezing, or vomiting. °· A break or cut in the PICC. It is important not to use scissors near the PICC. °· Nerve or tendon irritation or injury during PICC insertion. °How to take care of your PICC °Preventing problems °· You and any caregivers should wash your hands often with soap. Wash hands: °? Before touching the PICC line or the infusion device. °? Before changing a bandage (dressing). °· Flush the PICC as told by your  health care provider. Let your health care provider know right away if the PICC is hard to flush or does not flush. Do not use force to flush the PICC. °· Do not use a syringe that is less than 10 mL to flush the PICC. °· Avoid blood pressure checks on the arm in which the PICC is placed. °· Never pull or tug on the PICC. °· Do not take the PICC out yourself. Only a trained clinical professional should remove the PICC. °· Use clean and sterile supplies only. Keep the supplies in a dry place. Do not reuse needles, syringes, or any other supplies. Doing that can lead to infection. °· Keep pets and children away from your PICC line. °· Check the PICC insertion site every day for signs of infection. Check for: °? Leakage. °? Redness, swelling, or pain. °? Fluid or blood. °? Warmth. °? Pus or a bad smell. °PICC dressing care °· Keep your PICC bandage (dressing) clean and dry to prevent infection. °· Do not take baths, swim, or use a hot tub until your health care provider approves. Ask your health care provider if you can take showers. You may only be allowed to take sponge baths for bathing. When you are allowed to shower: °? Ask your health care provider to teach you how to wrap the PICC line. °? Cover the PICC line with clear plastic wrap and tape to keep it dry while showering. °· Follow instructions from your health care provider   about how to take care of your insertion site and dressing. Make sure you: °? Wash your hands with soap and water before you change your bandage (dressing). If soap and water are not available, use hand sanitizer. °? Change your dressing as told by your health care provider. °? Leave stitches (sutures), skin glue, or adhesive strips in place. These skin closures may need to stay in place for 2 weeks or longer. If adhesive strip edges start to loosen and curl up, you may trim the loose edges. Do not remove adhesive strips completely unless your health care provider tells you to do  that. °· Change your PICC dressing if it becomes loose or wet. °General instructions ° °· Carry your PICC identification card or wear a medical alert bracelet at all times. °· Keep the tube clamped at all times, unless it is being used. °· Carry a smooth-edge clamp with you at all times to place on the tube if it breaks. °· Do not use scissors or sharp objects near the tube. °· You may bend your arm and move it freely. If your PICC is near or at the bend of your elbow, avoid activity with repeated motion at the elbow. °· Avoid lifting heavy objects as told by your health care provider. °· Keep all follow-up visits as told by your health care provider. This is important. °Disposal of supplies °· Throw away any syringes in a disposal container that is meant for sharp items (sharps container). You can buy a sharps container from a pharmacy, or you can make one by using an empty hard plastic bottle with a cover. °· Place any used dressings or infusion bags into a plastic bag. Throw that bag in the trash. °Contact a health care provider if: °· You have pain in your arm, ear, face, or teeth. °· You have a fever or chills. °· You have redness, swelling, or pain around the insertion site. °· You have fluid or blood coming from the insertion site. °· Your insertion site feels warm to the touch. °· You have pus or a bad smell coming from the insertion site. °· Your skin feels hard and raised around the insertion site. °Get help right away if: °· Your PICC is accidentally pulled all the way out. If this happens, cover the insertion site with a bandage or gauze dressing. Do not throw the PICC away. Your health care provider will need to check it. °· Your PICC was tugged or pulled and has partially come out. Do not  push the PICC back in. °· You cannot flush the PICC, it is hard to flush, or the PICC leaks around the insertion site when it is flushed. °· You hear a "flushing" sound when the PICC is flushed. °· You feel your  heart racing or skipping beats. °· There is a hole or tear in the PICC. °· You have swelling in the arm in which the PICC was inserted. °· You have a red streak going up your arm from where the PICC was inserted. °Summary °· A peripherally inserted central catheter (PICC) is a long, thin, flexible tube (catheter) that is inserted into a vein in the upper arm. °· The PICC is inserted using a sterile technique by a specially trained nurse or physician. Only a trained clinical professional should remove it. °· Keep your PICC identification card with you at all times. °· Avoid blood pressure checks on the arm in which the PICC is placed. °· If cared for   properly, a PICC can remain in place for several months. Having a PICC can also allow a person to go home from the hospital sooner. °This information is not intended to replace advice given to you by your health care provider. Make sure you discuss any questions you have with your health care provider. °Document Released: 07/31/2002 Document Revised: 02/27/2016 Document Reviewed: 02/27/2016 °Elsevier Interactive Patient Education © 2019 [employer] ° °

## 2018-06-11 NOTE — Telephone Encounter (Addendum)
Wife called to inquire about CT scan--has not received call yet. Sent message to managed care to follow up on "incomplete" PA for scan requested by 06/20/18 Managed care processed the PA and wife called and provided number of central scheduling to schedule his CT scan.

## 2018-06-13 ENCOUNTER — Inpatient Hospital Stay: Payer: Medicare PPO

## 2018-06-13 ENCOUNTER — Other Ambulatory Visit: Payer: Self-pay

## 2018-06-13 DIAGNOSIS — Z452 Encounter for adjustment and management of vascular access device: Secondary | ICD-10-CM | POA: Diagnosis not present

## 2018-06-13 DIAGNOSIS — C7952 Secondary malignant neoplasm of bone marrow: Secondary | ICD-10-CM | POA: Diagnosis not present

## 2018-06-13 DIAGNOSIS — Z5112 Encounter for antineoplastic immunotherapy: Secondary | ICD-10-CM | POA: Diagnosis not present

## 2018-06-13 DIAGNOSIS — Z95828 Presence of other vascular implants and grafts: Secondary | ICD-10-CM

## 2018-06-13 DIAGNOSIS — Z79899 Other long term (current) drug therapy: Secondary | ICD-10-CM | POA: Diagnosis not present

## 2018-06-13 DIAGNOSIS — C3432 Malignant neoplasm of lower lobe, left bronchus or lung: Secondary | ICD-10-CM | POA: Diagnosis not present

## 2018-06-13 MED ORDER — SODIUM CHLORIDE 0.9% FLUSH
10.0000 mL | INTRAVENOUS | Status: DC | PRN
  Administered 2018-06-13: 10 mL
  Filled 2018-06-13: qty 10

## 2018-06-13 MED ORDER — HEPARIN SOD (PORK) LOCK FLUSH 100 UNIT/ML IV SOLN
250.0000 [IU] | Freq: Once | INTRAVENOUS | Status: AC
  Administered 2018-06-13: 250 [IU]
  Filled 2018-06-13: qty 5

## 2018-06-15 ENCOUNTER — Inpatient Hospital Stay: Payer: Medicare PPO

## 2018-06-15 ENCOUNTER — Other Ambulatory Visit: Payer: Self-pay | Admitting: Oncology

## 2018-06-15 ENCOUNTER — Other Ambulatory Visit: Payer: Self-pay

## 2018-06-15 DIAGNOSIS — Z95828 Presence of other vascular implants and grafts: Secondary | ICD-10-CM

## 2018-06-15 DIAGNOSIS — Z79899 Other long term (current) drug therapy: Secondary | ICD-10-CM | POA: Diagnosis not present

## 2018-06-15 DIAGNOSIS — C3432 Malignant neoplasm of lower lobe, left bronchus or lung: Secondary | ICD-10-CM | POA: Diagnosis not present

## 2018-06-15 DIAGNOSIS — Z452 Encounter for adjustment and management of vascular access device: Secondary | ICD-10-CM | POA: Diagnosis not present

## 2018-06-15 DIAGNOSIS — C7952 Secondary malignant neoplasm of bone marrow: Secondary | ICD-10-CM | POA: Diagnosis not present

## 2018-06-15 DIAGNOSIS — Z5112 Encounter for antineoplastic immunotherapy: Secondary | ICD-10-CM | POA: Diagnosis not present

## 2018-06-15 MED ORDER — SODIUM CHLORIDE 0.9% FLUSH
10.0000 mL | INTRAVENOUS | Status: DC | PRN
  Administered 2018-06-15: 10 mL
  Filled 2018-06-15: qty 10

## 2018-06-15 MED ORDER — HEPARIN SOD (PORK) LOCK FLUSH 100 UNIT/ML IV SOLN
250.0000 [IU] | Freq: Once | INTRAVENOUS | Status: AC
  Administered 2018-06-15: 250 [IU]
  Filled 2018-06-15: qty 5

## 2018-06-18 ENCOUNTER — Telehealth: Payer: Self-pay

## 2018-06-18 ENCOUNTER — Other Ambulatory Visit: Payer: Self-pay

## 2018-06-18 ENCOUNTER — Inpatient Hospital Stay: Payer: Medicare PPO

## 2018-06-18 DIAGNOSIS — C7952 Secondary malignant neoplasm of bone marrow: Secondary | ICD-10-CM | POA: Diagnosis not present

## 2018-06-18 DIAGNOSIS — Z95828 Presence of other vascular implants and grafts: Secondary | ICD-10-CM

## 2018-06-18 DIAGNOSIS — C3432 Malignant neoplasm of lower lobe, left bronchus or lung: Secondary | ICD-10-CM | POA: Diagnosis not present

## 2018-06-18 DIAGNOSIS — Z5112 Encounter for antineoplastic immunotherapy: Secondary | ICD-10-CM | POA: Diagnosis not present

## 2018-06-18 DIAGNOSIS — Z79899 Other long term (current) drug therapy: Secondary | ICD-10-CM | POA: Diagnosis not present

## 2018-06-18 DIAGNOSIS — Z452 Encounter for adjustment and management of vascular access device: Secondary | ICD-10-CM | POA: Diagnosis not present

## 2018-06-18 MED ORDER — HEPARIN SOD (PORK) LOCK FLUSH 100 UNIT/ML IV SOLN
250.0000 [IU] | Freq: Once | INTRAVENOUS | Status: AC
  Administered 2018-06-18: 250 [IU]
  Filled 2018-06-18: qty 5

## 2018-06-18 MED ORDER — SODIUM CHLORIDE 0.9% FLUSH
10.0000 mL | INTRAVENOUS | Status: DC | PRN
  Administered 2018-06-18: 10 mL
  Filled 2018-06-18: qty 10

## 2018-06-18 NOTE — Telephone Encounter (Signed)
Patient's wife called with concerns of pt having swelling in his left leg. States that pt is fine in the morning, but left leg is always very swollen by the end of the day. Patient elevates leg and takes Lasix every day. Patient doe not use compression socks because he complains that they hurt his left big toe, which has been problematic recently.   Joseph Berg unsure if this is "just part of his cancer" or if he needs to be seen/evaluated.   Please advise

## 2018-06-19 NOTE — Telephone Encounter (Signed)
Appointment has been made. No further questions at this time.  

## 2018-06-19 NOTE — Telephone Encounter (Signed)
Let have him come in tomorrow.  Of if needs to be seen emergently can put with Dr. Loni Muse today.

## 2018-06-20 ENCOUNTER — Ambulatory Visit (INDEPENDENT_AMBULATORY_CARE_PROVIDER_SITE_OTHER): Payer: Medicare PPO

## 2018-06-20 ENCOUNTER — Ambulatory Visit (INDEPENDENT_AMBULATORY_CARE_PROVIDER_SITE_OTHER): Payer: Medicare PPO | Admitting: Family Medicine

## 2018-06-20 ENCOUNTER — Encounter: Payer: Self-pay | Admitting: Family Medicine

## 2018-06-20 ENCOUNTER — Other Ambulatory Visit: Payer: Self-pay

## 2018-06-20 ENCOUNTER — Ambulatory Visit: Payer: Medicare PPO

## 2018-06-20 ENCOUNTER — Inpatient Hospital Stay: Payer: Medicare PPO

## 2018-06-20 ENCOUNTER — Encounter (HOSPITAL_COMMUNITY): Payer: Self-pay

## 2018-06-20 ENCOUNTER — Ambulatory Visit (HOSPITAL_COMMUNITY)
Admission: RE | Admit: 2018-06-20 | Discharge: 2018-06-20 | Disposition: A | Discharge status: Home / Self Care | Payer: Medicare PPO | Source: Ambulatory Visit | Attending: Oncology | Admitting: Oncology

## 2018-06-20 VITALS — BP 149/66 | HR 98 | Temp 97.5°F | Ht 69.0 in | Wt 213.0 lb

## 2018-06-20 DIAGNOSIS — Z95828 Presence of other vascular implants and grafts: Secondary | ICD-10-CM

## 2018-06-20 DIAGNOSIS — Z79899 Other long term (current) drug therapy: Secondary | ICD-10-CM | POA: Diagnosis not present

## 2018-06-20 DIAGNOSIS — R0602 Shortness of breath: Secondary | ICD-10-CM | POA: Diagnosis not present

## 2018-06-20 DIAGNOSIS — R600 Localized edema: Secondary | ICD-10-CM

## 2018-06-20 DIAGNOSIS — M79676 Pain in unspecified toe(s): Secondary | ICD-10-CM | POA: Diagnosis not present

## 2018-06-20 DIAGNOSIS — C3490 Malignant neoplasm of unspecified part of unspecified bronchus or lung: Secondary | ICD-10-CM | POA: Diagnosis not present

## 2018-06-20 DIAGNOSIS — M79605 Pain in left leg: Secondary | ICD-10-CM | POA: Diagnosis not present

## 2018-06-20 DIAGNOSIS — C3432 Malignant neoplasm of lower lobe, left bronchus or lung: Secondary | ICD-10-CM | POA: Diagnosis not present

## 2018-06-20 DIAGNOSIS — I5022 Chronic systolic (congestive) heart failure: Secondary | ICD-10-CM

## 2018-06-20 DIAGNOSIS — Z452 Encounter for adjustment and management of vascular access device: Secondary | ICD-10-CM | POA: Diagnosis not present

## 2018-06-20 DIAGNOSIS — R1909 Other intra-abdominal and pelvic swelling, mass and lump: Secondary | ICD-10-CM | POA: Diagnosis not present

## 2018-06-20 DIAGNOSIS — Z5112 Encounter for antineoplastic immunotherapy: Secondary | ICD-10-CM | POA: Diagnosis not present

## 2018-06-20 DIAGNOSIS — C7952 Secondary malignant neoplasm of bone marrow: Secondary | ICD-10-CM | POA: Diagnosis not present

## 2018-06-20 MED ORDER — HEPARIN SOD (PORK) LOCK FLUSH 100 UNIT/ML IV SOLN
250.0000 [IU] | Freq: Once | INTRAVENOUS | Status: AC
  Administered 2018-06-20: 250 [IU]
  Filled 2018-06-20: qty 5

## 2018-06-20 MED ORDER — SODIUM CHLORIDE 0.9% FLUSH
10.0000 mL | INTRAVENOUS | Status: DC | PRN
  Administered 2018-06-20: 10 mL
  Filled 2018-06-20: qty 10

## 2018-06-20 NOTE — Progress Notes (Signed)
Established Patient Office Visit  Subjective:  Patient ID: Joseph Berg, male    DOB: [birth year]  Age: 85 y.o. MRN: [id number]  CC:  Chief Complaint  Patient presents with  . Edema  . Toe Pain    HPI [name] presents for left leg swelling.  He has a history of coronary artery disease acute kidney injury and heart failure c/ of leg swelling that started over the weekend.  He denies any significant changes in diet or salt intake.  No recent medication changes.  He takes furosemide 40 mg daily.  His weight is up about 5 pounds.  He reports he is also felt a little short of breath but no chest pain.  He does have a history of systolic heart failure.  He has been very sedentary but that is pretty much better over the last 6 months and is not new or recent.  He is concerned because his left leg is very painful particularly over the calf muscle and radiates up into the posterior left leg.  Says has had toe pain as well for about 2 weeks.  Had a pedicure in January and they started to turn yellow so he has been using an antifungal treatment on them.  He denies any redness or drainage.  No red streaking on the toes.   His weight is up about 5 pounds.  Past Medical History:  Diagnosis Date  . BPH (benign prostatic hyperplasia)   . CAD (coronary artery disease)    a. s/p CABG x1 with LIMA-LAD 1994. b. LM & 3v CAD by cath 07/2012  . CHF (congestive heart failure) ([place])    a. EF 35-40% by echo 07/2012.  [name] Complication of anesthesia    affected patients memory. Pt stated "I couldn't remember anything for three months...just bits and pieces"  . Emphysema    a. Moderate emphysema by CT 06/2012.  [name] Heart attack ([place]) 1994  . Hyperlipidemia   . Hypertension   . Hypoparathyroidism ([place])   . NSVT (nonsustained ventricular tachycardia) ([place])    a. Seen on tele 07/2012.  . OSA on CPAP    pt stated he does not wear CPAP because he no longer has sleep apnea  . Pneumonia   .  Pseudoaneurysm of left femoral artery ([place])    a. post cath s/p compression 07/2012, associated w/ anemia.  . Sciatica 2012   Qualifier: Diagnosis of  By: Madilyn Fireman MD, Barnetta Chapel      Past Surgical History:  Procedure Laterality Date  . CATARACT EXTRACTION, BILATERAL    . COLONOSCOPY    . CORONARY ARTERY BYPASS GRAFT  03-23-92  . CORONARY ARTERY BYPASS GRAFT N/A 08/14/2012   Procedure: REDO CORONARY ARTERY BYPASS GRAFTING (CABG);  Surgeon: Gaye Pollack, MD;  Location: [facility];  Service: Open Heart Surgery;  Laterality: N/A;  . INTRAOPERATIVE TRANSESOPHAGEAL ECHOCARDIOGRAM N/A 08/14/2012   Procedure: INTRAOPERATIVE TRANSESOPHAGEAL ECHOCARDIOGRAM;  Surgeon: Gaye Pollack, MD;  Location: [facility];  Service: Open Heart Surgery;  Laterality: N/A;  . LUMBAR LAMINECTOMY/DECOMPRESSION MICRODISCECTOMY Left 02/18/2015   Procedure: LUMBAR LAMINECTOMY/DECOMPRESSION MICRODISCECTOMY- LEFT FOUR-FIVE ;  Surgeon: Ashok Pall, MD;  Location: [facility];  Service: Neurosurgery;  Laterality: Left;    Family History  Problem Relation Age of Onset  . Heart attack Father 33  . Stroke Brother 80  . Hypertension Brother   . Hyperlipidemia Brother     Social History   Socioeconomic History  . Marital status: Married  Spouse name: Not on file  . Number of children: Not on file  . Years of education: Not on file  . Highest education level: Not on file  Occupational History  . Not on file  Social Needs  . Financial resource strain: Not on file  . Food insecurity:    Worry: Not on file    Inability: Not on file  . Transportation needs:    Medical: Not on file    Non-medical: Not on file  Tobacco Use  . Smoking status: Current Some Day Smoker    Packs/day: 1.00    Years: 60.00    Pack years: 60.00    Types: Cigarettes, Pipe  . Smokeless tobacco: Never Used  . Tobacco comment: 01/11/2018 "smokes cigars only now"  Substance and Sexual Activity  . Alcohol use: Yes    Comment: Occasional  . Drug use:  No  . Sexual activity: Not on file    Comment: works part-time, Conservation officer, [employer] completed H, married, no children, regular exercise.S  Lifestyle  . Physical activity:    Days per week: Not on file    Minutes per session: Not on file  . Stress: Not on file  Relationships  . Social connections:    Talks on phone: Not on file    Gets together: Not on file    Attends religious service: Not on file    Active member of club or organization: Not on file    Attends meetings of clubs or organizations: Not on file    Relationship status: Not on file  . Intimate partner violence:    Fear of current or ex partner: Not on file    Emotionally abused: Not on file    Physically abused: Not on file    Forced sexual activity: Not on file  Other Topics Concern  . Not on file  Social History Narrative  . Not on file    Outpatient Medications Prior to Visit  Medication Sig Dispense Refill  . AMBULATORY NON FORMULARY MEDICATION Nebulizer - use as needed - with supplies.  Diagnosis of COPD J44.1 1 each 0  . calcium-vitamin D (OSCAL 500/200 D-3) 500-200 MG-UNIT tablet Take 1 tablet by mouth 2 (two) times daily. 180 tablet 3  . carvedilol (COREG) 12.5 MG tablet Take 1 tablet (12.5 mg total) by mouth 2 (two) times daily with a meal. 180 tablet 3  . cholecalciferol (VITAMIN D3) 25 MCG (1000 UT) tablet Take 1,000 Units by mouth daily.    . Cyanocobalamin (VITAMIN B-12 PO) Take 1 tablet by mouth daily.    . ETOPOSIDE IV Inject into the vein.    . finasteride (PROSCAR) 5 MG tablet Take 1 tablet (5 mg total) by mouth every evening. 90 tablet 1  . Fluticasone-Umeclidin-Vilant (TRELEGY ELLIPTA) 100-62.5-25 MCG/INH AEPB Inhale 1 Inhaler into the lungs daily. 3 each 1  . furosemide (LASIX) 40 MG tablet Take 1 tablet (40 mg total) by mouth daily. 90 tablet 0  . ipratropium-albuterol (DUONEB) 0.5-2.5 (3) MG/3ML SOLN Take 3 mLs by nebulization every 2 (two) hours as needed (sob).     [name] loperamide (IMODIUM A-D)  2 MG tablet Take 2 mg by mouth 4 (four) times daily as needed for diarrhea or loose stools.    . Multiple Vitamin (MULTIVITAMIN) capsule Take 1 capsule by mouth daily.    . Probiotic Product (TRUBIOTICS PO) Take 1 tablet by mouth daily.    . prochlorperazine (COMPAZINE) 5 MG tablet Take 1 tablet (5 mg  total) by mouth every 6 (six) hours as needed for nausea or vomiting. 30 tablet 0  . rosuvastatin (CRESTOR) 40 MG tablet Take 1 tablet (40 mg total) by mouth daily. 90 tablet 30  . VENTOLIN HFA 108 (90 Base) MCG/ACT inhaler TAKE 2 PUFFS BY MOUTH EVERY 6 HOURS AS NEEDED FOR WHEEZE OR SHORTNESS OF BREATH 18 Inhaler 2  . vitamin C (ASCORBIC ACID) 250 MG tablet Take 750 mg by mouth daily.    [name] CARBOPLATIN IV Inject into the vein.    [name] AMBULATORY NON FORMULARY MEDICATION Medication Name: portable O2 concentrator, (Inogen). Dx:J44.1.  He is oxygen dependent and having to go to chemotherapy appointments multiple times a week. Fax to [facility] (Patient not taking: Reported on 05/28/2018) 1 each 0  . feeding supplement, ENSURE ENLIVE, (ENSURE ENLIVE) LIQD Take 237 mLs by mouth 2 (two) times daily between meals. 237 mL 12  . KLOR-CON M20 20 MEQ tablet TAKE 1 TABLET BY MOUTH EVERY DAY 30 tablet 1  . Nebulizer MISC Generic nebulizer plus supplies 1 each 0   No facility-administered medications prior to visit.     Allergies  Allergen Reactions  . Hydrochlorothiazide Other (See Comments)    Affected renal function.  "affected kidneys and calcium level"    ROS Review of Systems    Objective:    Physical Exam  Constitutional: He is oriented to person, place, and time. He appears well-developed and well-nourished.  HENT:  Head: Normocephalic and atraumatic.  Eyes: Conjunctivae are normal.  Cardiovascular: Normal rate, regular rhythm and normal heart sounds.  Pulmonary/Chest: Effort normal and breath sounds normal.  Musculoskeletal:        General: Edema present.     Comments: Bilateral  lower extremity edema that is pitting up to both knees.  1+.  Worse on the right ankle compared to the left ankle.  But he is extremely tender over the left calf with gentle squeeze.  Nontender over the right calf.  Unable to palpate dorsal pedal pulses or posterior tibial pulses on either foot though he has good capillary refill in both great toes.  Is a slightly dusky appearance to the left foot compared to the right foot.  Neurological: He is alert and oriented to person, place, and time.  Skin: Skin is warm and dry.  Psychiatric: He has a normal mood and affect. His behavior is normal.    BP (!) 149/66   Pulse 98   Temp (!) 97.5 F (36.4 C)   Ht 5\' 9"  (1.753 m)   Wt 213 lb (96.6 kg)   SpO2 98%   BMI 31.45 kg/m  Wt Readings from Last 3 Encounters:  06/20/18 213 lb (96.6 kg)  05/28/18 208 lb (94.3 kg)  05/28/18 208 lb 3.2 oz (94.4 kg)     There are no preventive care reminders to display for this patient.  There are no preventive care reminders to display for this patient.  Lab Results  Component Value Date   TSH 2.582 03/14/2018   Lab Results  Component Value Date   WBC 4.5 06/08/2018   HGB 8.9 (L) 06/08/2018   HCT 28.0 (L) 06/08/2018   MCV 98.6 06/08/2018   PLT 59 (L) 06/08/2018   Lab Results  Component Value Date   NA 145 06/11/2018   K 4.1 06/11/2018   CO2 28 06/11/2018   GLUCOSE 100 (H) 06/11/2018   BUN 14 06/11/2018   CREATININE 1.10 06/11/2018   BILITOT 0.7 05/28/2018  ALKPHOS 64 05/28/2018   AST 25 05/28/2018   ALT 24 05/28/2018   PROT 6.8 05/28/2018   ALBUMIN 3.8 05/28/2018   CALCIUM 10.3 06/11/2018   ANIONGAP 13 06/11/2018   Lab Results  Component Value Date   CHOL 104 07/11/2017   Lab Results  Component Value Date   HDL 35 (L) 07/11/2017   Lab Results  Component Value Date   LDLCALC 46 07/11/2017   Lab Results  Component Value Date   TRIG 145 07/11/2017   Lab Results  Component Value Date   CHOLHDL 3.0 07/11/2017   Lab Results   Component Value Date   HGBA1C 5.7 (A) 11/03/2017      Assessment & Plan:   Problem List Items Addressed This Visit      Cardiovascular and Mediastinum   Chronic systolic congestive heart failure (HCC) (Chronic)   Relevant Orders   B Nat Peptide   COMPLETE METABOLIC PANEL WITH GFR   CBC   TSH   Urinalysis, Routine w reflex microscopic    Other Visit Diagnoses    Left leg pain    -  Primary   Relevant Orders   US Venous Img Lower Unilateral Left (Completed)   B Nat Peptide   COMPLETE METABOLIC PANEL WITH GFR   CBC   TSH   Urinalysis, Routine w reflex microscopic   SOB (shortness of breath)       Relevant Orders   DG Chest 2 View   B Nat Peptide   COMPLETE METABOLIC PANEL WITH GFR   CBC   TSH   Urinalysis, Routine w reflex microscopic   Bilateral lower extremity edema       Relevant Orders   B Nat Peptide   COMPLETE METABOLIC PANEL WITH GFR   CBC   TSH   Urinalysis, Routine w reflex microscopic   Pain of great toe, unspecified laterality          Bilateral lower extremity edema-suspect probably related to his systolic heart failure but will check for anemia, thyroid disorder, and change in renal function.  Also check urinalysis just to rule out UTI though he is not having any urinary symptoms currently.  His weight is up about 5 pounds.  So want him to increase his Lasix to 2 tabs daily until his home weight comes down 5 pounds.  I want him to start weighing himself when he first gets home today and then each day first thing in the morning.  I want him to call me in a week to let me know how he is doing and how many days he actually had to take 2 tabs of the Lasix.  He is Artie taken 1 tab earlier this morning for today he can just take the second tab when he gets home sometime after lunch.  Left leg pain-I am concerned with the tenderness over the calf as well as a little bit of dusky appearance of the left foot that he could have a DVT so I am going to schedule him  for ultrasound downstairs for further work-up and evaluation.  Shortness of breath-likely due to volume overload but will work-up further with chest x-ray.  He is afebrile which is reassuring.  Great toe pain, bilateral-unclear etiology I am not sure if maybe just pressure from his shoes I do not see any significant erythema or drainage or ingrown nails.  Make sure wearing shoes that are not pressing on the end of his foot.  No orders of  the defined types were placed in this encounter.   Follow-up: Return if symptoms worsen or fail to improve.    Beatrice Lecher, MD

## 2018-06-21 ENCOUNTER — Other Ambulatory Visit: Payer: Self-pay | Admitting: Cardiology

## 2018-06-21 DIAGNOSIS — I714 Abdominal aortic aneurysm, without rupture, unspecified: Secondary | ICD-10-CM

## 2018-06-21 LAB — URINALYSIS, ROUTINE W REFLEX MICROSCOPIC
Bacteria, UA: NONE SEEN /HPF
Bilirubin Urine: NEGATIVE
Glucose, UA: NEGATIVE
Hgb urine dipstick: NEGATIVE
Hyaline Cast: NONE SEEN /LPF
Ketones, ur: NEGATIVE
Nitrite: NEGATIVE
Protein, ur: NEGATIVE
RBC / HPF: NONE SEEN /HPF (ref 0–2)
Specific Gravity, Urine: 1.017 (ref 1.001–1.03)
Squamous Epithelial / HPF: NONE SEEN /HPF (ref <=5)
pH: <=5 (ref 5.0–8.0)

## 2018-06-21 LAB — COMPLETE METABOLIC PANEL WITH GFR
AG Ratio: 2 (calc) (ref 1.0–2.5)
ALT: 21 U/L (ref 9–46)
AST: 26 U/L (ref 10–35)
Albumin: 4.4 g/dL (ref 3.6–5.1)
Alkaline phosphatase (APISO): 56 U/L (ref 35–144)
BUN/Creatinine Ratio: 16 (calc) (ref 6–22)
BUN: 19 mg/dL (ref 7–25)
CO2: 27 mmol/L (ref 20–32)
Calcium: 10.2 mg/dL (ref 8.6–10.3)
Chloride: 104 mmol/L (ref 98–110)
Creat: 1.22 mg/dL — ABNORMAL HIGH (ref 0.70–1.11)
GFR, Est African American: 64 mL/min/{1.73_m2} (ref >=60)
GFR, Est Non African American: 55 mL/min/{1.73_m2} — ABNORMAL LOW (ref >=60)
Globulin: 2.2 g/dL (calc) (ref 1.9–3.7)
Glucose, Bld: 124 mg/dL — ABNORMAL HIGH (ref 65–99)
Potassium: 4.6 mmol/L (ref 3.5–5.3)
Sodium: 142 mmol/L (ref 135–146)
Total Bilirubin: 0.6 mg/dL (ref 0.2–1.2)
Total Protein: 6.6 g/dL (ref 6.1–8.1)

## 2018-06-21 LAB — BRAIN NATRIURETIC PEPTIDE: Brain Natriuretic Peptide: 44 pg/mL (ref <100)

## 2018-06-21 LAB — CBC
HCT: 30.1 % — ABNORMAL LOW (ref 38.5–50.0)
Hemoglobin: 10.1 g/dL — ABNORMAL LOW (ref 13.2–17.1)
MCH: 32.5 pg (ref 27.0–33.0)
MCHC: 33.6 g/dL (ref 32.0–36.0)
MCV: 96.8 fL (ref 80.0–100.0)
MPV: 10.1 fL (ref 7.5–12.5)
Platelets: 212 10*3/uL (ref 140–400)
RBC: 3.11 10*6/uL — ABNORMAL LOW (ref 4.20–5.80)
RDW: 17.1 % — ABNORMAL HIGH (ref 11.0–15.0)
WBC: 9.7 10*3/uL (ref 3.8–10.8)

## 2018-06-21 LAB — TSH: TSH: 1.4 mIU/L (ref 0.40–4.50)

## 2018-06-22 ENCOUNTER — Other Ambulatory Visit: Payer: Self-pay

## 2018-06-22 ENCOUNTER — Inpatient Hospital Stay: Payer: Medicare PPO

## 2018-06-22 DIAGNOSIS — Z452 Encounter for adjustment and management of vascular access device: Secondary | ICD-10-CM | POA: Diagnosis not present

## 2018-06-22 DIAGNOSIS — Z79899 Other long term (current) drug therapy: Secondary | ICD-10-CM | POA: Diagnosis not present

## 2018-06-22 DIAGNOSIS — Z95828 Presence of other vascular implants and grafts: Secondary | ICD-10-CM

## 2018-06-22 DIAGNOSIS — Z5112 Encounter for antineoplastic immunotherapy: Secondary | ICD-10-CM | POA: Diagnosis not present

## 2018-06-22 DIAGNOSIS — C3432 Malignant neoplasm of lower lobe, left bronchus or lung: Secondary | ICD-10-CM | POA: Diagnosis not present

## 2018-06-22 DIAGNOSIS — C7952 Secondary malignant neoplasm of bone marrow: Secondary | ICD-10-CM | POA: Diagnosis not present

## 2018-06-22 MED ORDER — SODIUM CHLORIDE 0.9% FLUSH
10.0000 mL | INTRAVENOUS | Status: DC | PRN
  Filled 2018-06-22: qty 10

## 2018-06-22 MED ORDER — HEPARIN SOD (PORK) LOCK FLUSH 100 UNIT/ML IV SOLN
250.0000 [IU] | Freq: Once | INTRAVENOUS | Status: DC
  Filled 2018-06-22: qty 5

## 2018-06-24 ENCOUNTER — Other Ambulatory Visit: Payer: Self-pay | Admitting: Oncology

## 2018-06-25 ENCOUNTER — Telehealth: Payer: Self-pay | Admitting: Nurse Practitioner

## 2018-06-25 ENCOUNTER — Inpatient Hospital Stay: Payer: Medicare PPO

## 2018-06-25 ENCOUNTER — Encounter: Payer: Self-pay | Admitting: Nurse Practitioner

## 2018-06-25 ENCOUNTER — Inpatient Hospital Stay (HOSPITAL_BASED_OUTPATIENT_CLINIC_OR_DEPARTMENT_OTHER): Payer: Medicare PPO | Admitting: Nurse Practitioner

## 2018-06-25 ENCOUNTER — Other Ambulatory Visit: Payer: Self-pay

## 2018-06-25 VITALS — BP 156/74 | HR 81 | Temp 98.0°F | Wt 213.7 lb

## 2018-06-25 DIAGNOSIS — D630 Anemia in neoplastic disease: Secondary | ICD-10-CM | POA: Diagnosis not present

## 2018-06-25 DIAGNOSIS — D6481 Anemia due to antineoplastic chemotherapy: Secondary | ICD-10-CM | POA: Diagnosis not present

## 2018-06-25 DIAGNOSIS — I2510 Atherosclerotic heart disease of native coronary artery without angina pectoris: Secondary | ICD-10-CM | POA: Diagnosis not present

## 2018-06-25 DIAGNOSIS — C7952 Secondary malignant neoplasm of bone marrow: Secondary | ICD-10-CM

## 2018-06-25 DIAGNOSIS — C3490 Malignant neoplasm of unspecified part of unspecified bronchus or lung: Secondary | ICD-10-CM

## 2018-06-25 DIAGNOSIS — Z95828 Presence of other vascular implants and grafts: Secondary | ICD-10-CM

## 2018-06-25 DIAGNOSIS — N289 Disorder of kidney and ureter, unspecified: Secondary | ICD-10-CM | POA: Diagnosis not present

## 2018-06-25 DIAGNOSIS — Z79899 Other long term (current) drug therapy: Secondary | ICD-10-CM | POA: Diagnosis not present

## 2018-06-25 DIAGNOSIS — I509 Heart failure, unspecified: Secondary | ICD-10-CM

## 2018-06-25 DIAGNOSIS — C3432 Malignant neoplasm of lower lobe, left bronchus or lung: Secondary | ICD-10-CM | POA: Diagnosis not present

## 2018-06-25 DIAGNOSIS — D6959 Other secondary thrombocytopenia: Secondary | ICD-10-CM

## 2018-06-25 DIAGNOSIS — J449 Chronic obstructive pulmonary disease, unspecified: Secondary | ICD-10-CM | POA: Diagnosis not present

## 2018-06-25 DIAGNOSIS — D649 Anemia, unspecified: Secondary | ICD-10-CM

## 2018-06-25 DIAGNOSIS — Z5112 Encounter for antineoplastic immunotherapy: Secondary | ICD-10-CM | POA: Diagnosis not present

## 2018-06-25 DIAGNOSIS — Z452 Encounter for adjustment and management of vascular access device: Secondary | ICD-10-CM | POA: Diagnosis not present

## 2018-06-25 DIAGNOSIS — R0609 Other forms of dyspnea: Secondary | ICD-10-CM

## 2018-06-25 LAB — CBC WITH DIFFERENTIAL (CANCER CENTER ONLY)
Abs Immature Granulocytes: 0.03 10*3/uL (ref 0.00–0.07)
Basophils Absolute: 0 10*3/uL (ref 0.0–0.1)
Basophils Relative: 0 %
Eosinophils Absolute: 0 10*3/uL (ref 0.0–0.5)
Eosinophils Relative: 1 %
HCT: 30 % — ABNORMAL LOW (ref 39.0–52.0)
Hemoglobin: 9.6 g/dL — ABNORMAL LOW (ref 13.0–17.0)
Immature Granulocytes: 0 %
Lymphocytes Relative: 20 %
Lymphs Abs: 1.5 10*3/uL (ref 0.7–4.0)
MCH: 32 pg (ref 26.0–34.0)
MCHC: 32 g/dL (ref 30.0–36.0)
MCV: 100 fL (ref 80.0–100.0)
Monocytes Absolute: 1 10*3/uL (ref 0.1–1.0)
Monocytes Relative: 13 %
Neutro Abs: 5 10*3/uL (ref 1.7–7.7)
Neutrophils Relative %: 66 %
Platelet Count: 154 10*3/uL (ref 150–400)
RBC: 3 MIL/uL — ABNORMAL LOW (ref 4.22–5.81)
RDW: 17.6 % — ABNORMAL HIGH (ref 11.5–15.5)
WBC Count: 7.5 10*3/uL (ref 4.0–10.5)
nRBC: 0 % (ref 0.0–0.2)

## 2018-06-25 LAB — CMP (CANCER CENTER ONLY)
ALT: 20 U/L (ref 0–44)
AST: 20 U/L (ref 15–41)
Albumin: 3.7 g/dL (ref 3.5–5.0)
Alkaline Phosphatase: 49 U/L (ref 38–126)
Anion gap: 10 (ref 5–15)
BUN: 15 mg/dL (ref 8–23)
CO2: 28 mmol/L (ref 22–32)
Calcium: 10 mg/dL (ref 8.9–10.3)
Chloride: 105 mmol/L (ref 98–111)
Creatinine: 1.33 mg/dL — ABNORMAL HIGH (ref 0.61–1.24)
GFR, Est AFR Am: 57 mL/min — ABNORMAL LOW (ref >60)
GFR, Estimated: 49 mL/min — ABNORMAL LOW (ref >60)
Glucose, Bld: 156 mg/dL — ABNORMAL HIGH (ref 70–99)
Potassium: 3.8 mmol/L (ref 3.5–5.1)
Sodium: 143 mmol/L (ref 135–145)
Total Bilirubin: 0.5 mg/dL (ref 0.3–1.2)
Total Protein: 6.6 g/dL (ref 6.5–8.1)

## 2018-06-25 LAB — TSH: TSH: 0.808 u[IU]/mL (ref 0.320–4.118)

## 2018-06-25 MED ORDER — SODIUM CHLORIDE 0.9 % IV SOLN
1200.0000 mg | Freq: Once | INTRAVENOUS | Status: AC
  Administered 2018-06-25: 1200 mg via INTRAVENOUS
  Filled 2018-06-25: qty 20

## 2018-06-25 MED ORDER — SODIUM CHLORIDE 0.9% FLUSH
10.0000 mL | INTRAVENOUS | Status: DC | PRN
  Administered 2018-06-25: 10 mL
  Filled 2018-06-25: qty 10

## 2018-06-25 MED ORDER — SODIUM CHLORIDE 0.9 % IV SOLN
Freq: Once | INTRAVENOUS | Status: AC
  Administered 2018-06-25: 12:00:00 via INTRAVENOUS
  Filled 2018-06-25: qty 250

## 2018-06-25 NOTE — Progress Notes (Signed)
Pt had F/U with Ned Card NP. Per Lattie Haw wife aware of scan results.

## 2018-06-25 NOTE — Progress Notes (Signed)
At d/c @ 1420 pm IV site looked unremarkable, d & I.  Encouraged to call if any changes, bleeding, tenderness, drainage, redness, swelling but may take dsg off in 48 hours. Pt expressed understanding & this was written on his d/c papers.

## 2018-06-25 NOTE — Telephone Encounter (Signed)
Scheduled appt per 5/18 los.  Treatment nurse is going to give patient an appt calendar.  Patient aware of appt date.

## 2018-06-25 NOTE — Progress Notes (Signed)
Double lumen PICC removed from Right upper arm per order Ned Card NP.  Site unremarkable.  Vaseline gauze applied to site & pressure dressing.  Pt supine x 30 min.

## 2018-06-25 NOTE — Progress Notes (Addendum)
  [facility] OFFICE PROGRESS NOTE   Diagnosis: Small cell lung cancer  INTERVAL HISTORY:   [name] returns as scheduled.  He completed cycle 4 etoposide/carboplatin/atezolizumab beginning 05/28/2018.  He denies nausea/vomiting.  No mouth sores.  No diarrhea.  No rash.  He denies pain.  He has stable dyspnea on exertion.  No cough or fever.  Objective:  Vital signs in last 24 hours:  Blood pressure (!) 156/74, pulse 81, temperature 98 F (36.7 C), temperature source Oral, weight 213 lb 11.2 oz (96.9 kg), SpO2 98 %.    HEENT: No thrush or ulcers. GI: Abdomen soft and nontender.  No hepatomegaly. Vascular: Trace edema at the lower legs bilaterally.  Calves soft and nontender. Skin: No rash. Right upper extremity PICC is without erythema.   Lab Results:  Lab Results  Component Value Date   WBC 7.5 06/25/2018   HGB 9.6 (L) 06/25/2018   HCT 30.0 (L) 06/25/2018   MCV 100.0 06/25/2018   PLT 154 06/25/2018   NEUTROABS 5.0 06/25/2018    Imaging:  No results found.  Medications: I have reviewed the patient's current medications.  Assessment/Plan: 1. Extensive stage small cell lung cancer  CT chest 03/05/2018-left hilar mass, small mediastinal lymph nodes, atelectasis versus consolidation versus mass in the superior segment of the left lower lobe  Bone marrow biopsy 03/07/2018-extensive involvement of the bone marrow with metastatic small cell carcinoma consistent with a lung primary, small foci of non-small cell differentiation  Cycle 1 etoposide/carboplatin 03/09/2018, G-CSF started 03/12/2018 and discontinued 03/20/2018  CT head 03/21/2018-no evidence of metastatic disease  Cycle 2 etoposide/carboplatin, dose escalation of etoposide and carboplatin 04/03/2018  Cycle 3 etoposide/carboplatin with addition of atezolizumab 04/30/2018  Cycle 4 etoposide/carboplatin/atezolizumab 05/28/2018  Restaging chest CT 06/20/2018- left hilar nodal mass no longer  visualized/measurable.  Subpleural mass within the superior segment left lower lobe has decreased in size.  No new or progressive findings.  Atezolizumab 06/25/2018 2. Thrombocytopenia secondary to #1-improved 3.COPD 4.History of coronary artery disease 5.CHF 6.BPH 7.Admission with pneumonia December 2019 8.Mild renal insufficiency 9.Elevated liver enzymes 10. Coagulopathy 11. History of mild elevation of the calcium level 12. Anemia/leukopenia secondary to small cell lung cancer and chemotherapy-improved 13. Hypokalemia-likely secondary to furosemide therapy, potassium supplement started 04/03/2018 14.Admission 04/19/2018 with left lung pneumonia  Disposition: [name] appears stable.  He has completed 4 cycles of systemic therapy.  Recent restaging chest CT shows improvement.  The plan is to continue atezolizumab alone on a 3-week schedule.  We reviewed the CBC from today.  Counts are adequate for treatment.  The PICC line will be removed after atezolizumab today.  He will return for lab, follow-up, atezolizumab in 3 weeks.  He will contact the office in the interim with any problems.  The above was reviewed with his wife by phone.  Patient seen with Dr. Benay Spice.    Ned Card ANP/GNP-BC   06/25/2018  11:08 AM This was a shared visit with Ned Card.  [name] has completed has completed 4 cycles of systemic therapy.  There has been clinical and x-ray improvement.  I reviewed the CT images.  We discussed treatment options with [name].  I recommend continued treatment with atezolizumab.  Julieanne Manson, MD

## 2018-06-25 NOTE — Patient Instructions (Signed)
[facility] Discharge Instructions for Patients Receiving Chemotherapy  Today you received the following immunotherapy agent:  Tecentriq.  You did not receive any chemotherapy today.   To help prevent nausea and vomiting after your treatment, we encourage you to take your nausea medication as needed.    If you develop nausea and vomiting that is not controlled by your nausea medication, call the clinic.   BELOW ARE SYMPTOMS THAT SHOULD BE REPORTED IMMEDIATELY:  *FEVER GREATER THAN 100.5 F  *CHILLS WITH OR WITHOUT FEVER  NAUSEA AND VOMITING THAT IS NOT CONTROLLED WITH YOUR NAUSEA MEDICATION  *UNUSUAL SHORTNESS OF BREATH  *UNUSUAL BRUISING OR BLEEDING  TENDERNESS IN MOUTH AND THROAT WITH OR WITHOUT PRESENCE OF ULCERS  *URINARY PROBLEMS  *BOWEL PROBLEMS  UNUSUAL RASH Items with * indicate a potential emergency and should be followed up as soon as possible.  Feel free to call the clinic should you have any questions or concerns. The clinic phone number is [phone].  Please show the [facility] at check-in to the Emergency Department and triage nurse.

## 2018-06-26 ENCOUNTER — Telehealth: Payer: Self-pay | Admitting: *Deleted

## 2018-06-26 ENCOUNTER — Inpatient Hospital Stay: Payer: Medicare PPO

## 2018-06-26 ENCOUNTER — Other Ambulatory Visit: Payer: Self-pay | Admitting: *Deleted

## 2018-06-26 DIAGNOSIS — L729 Follicular cyst of the skin and subcutaneous tissue, unspecified: Secondary | ICD-10-CM

## 2018-06-26 MED ORDER — AMBULATORY NON FORMULARY MEDICATION: 0 refills | Status: DC

## 2018-06-26 NOTE — Progress Notes (Signed)
Please call patient and make sure that he is able to Walk around without his oxygen dropping less than 90 % without the oxygen on before we discontinue it.

## 2018-06-26 NOTE — Progress Notes (Signed)
Please see phone note. Wasn't able to open this note until after I created a new phone note sorry.Joseph Berg, [place]

## 2018-06-26 NOTE — Telephone Encounter (Signed)
Spoke w/pt's wife she stated that he can walk around all day w/o any issues an his O2 stays between 94-95% consistently and she monitors this. As we were speaking she had him to walk about 20 ft and it was at 97%.[name], [place]

## 2018-06-27 ENCOUNTER — Ambulatory Visit: Payer: Medicare PPO

## 2018-06-28 DIAGNOSIS — J439 Emphysema, unspecified: Secondary | ICD-10-CM | POA: Diagnosis not present

## 2018-06-28 DIAGNOSIS — C3490 Malignant neoplasm of unspecified part of unspecified bronchus or lung: Secondary | ICD-10-CM | POA: Diagnosis not present

## 2018-06-28 DIAGNOSIS — J449 Chronic obstructive pulmonary disease, unspecified: Secondary | ICD-10-CM | POA: Diagnosis not present

## 2018-06-28 DIAGNOSIS — I5022 Chronic systolic (congestive) heart failure: Secondary | ICD-10-CM | POA: Diagnosis not present

## 2018-06-29 ENCOUNTER — Ambulatory Visit: Payer: Medicare PPO

## 2018-07-06 ENCOUNTER — Other Ambulatory Visit: Payer: Self-pay | Admitting: Sports Medicine

## 2018-07-06 DIAGNOSIS — M7061 Trochanteric bursitis, right hip: Secondary | ICD-10-CM

## 2018-07-10 ENCOUNTER — Other Ambulatory Visit: Payer: Self-pay

## 2018-07-10 MED ORDER — CARVEDILOL 12.5 MG PO TABS: 12.5000 mg | ORAL_TABLET | Freq: Two times a day (BID) | ORAL | 0 refills | Status: DC

## 2018-07-10 NOTE — Telephone Encounter (Signed)
Rx(s) sent to pharmacy electronically.  

## 2018-07-13 ENCOUNTER — Other Ambulatory Visit: Payer: Self-pay

## 2018-07-13 DIAGNOSIS — C3490 Malignant neoplasm of unspecified part of unspecified bronchus or lung: Secondary | ICD-10-CM

## 2018-07-15 ENCOUNTER — Other Ambulatory Visit: Payer: Self-pay | Admitting: Oncology

## 2018-07-16 ENCOUNTER — Inpatient Hospital Stay: Payer: Medicare PPO | Attending: Oncology

## 2018-07-16 ENCOUNTER — Telehealth: Payer: Self-pay | Admitting: Oncology

## 2018-07-16 ENCOUNTER — Other Ambulatory Visit: Payer: Self-pay

## 2018-07-16 ENCOUNTER — Inpatient Hospital Stay: Payer: Medicare PPO

## 2018-07-16 ENCOUNTER — Inpatient Hospital Stay (HOSPITAL_BASED_OUTPATIENT_CLINIC_OR_DEPARTMENT_OTHER): Payer: Medicare PPO | Admitting: Oncology

## 2018-07-16 VITALS — BP 136/67 | HR 80 | Temp 98.5°F | Resp 18 | Ht 69.0 in | Wt 211.2 lb

## 2018-07-16 DIAGNOSIS — I2510 Atherosclerotic heart disease of native coronary artery without angina pectoris: Secondary | ICD-10-CM

## 2018-07-16 DIAGNOSIS — C3432 Malignant neoplasm of lower lobe, left bronchus or lung: Secondary | ICD-10-CM

## 2018-07-16 DIAGNOSIS — C7952 Secondary malignant neoplasm of bone marrow: Secondary | ICD-10-CM | POA: Insufficient documentation

## 2018-07-16 DIAGNOSIS — D630 Anemia in neoplastic disease: Secondary | ICD-10-CM | POA: Diagnosis not present

## 2018-07-16 DIAGNOSIS — I509 Heart failure, unspecified: Secondary | ICD-10-CM

## 2018-07-16 DIAGNOSIS — D6481 Anemia due to antineoplastic chemotherapy: Secondary | ICD-10-CM

## 2018-07-16 DIAGNOSIS — D6959 Other secondary thrombocytopenia: Secondary | ICD-10-CM | POA: Diagnosis not present

## 2018-07-16 DIAGNOSIS — Z5112 Encounter for antineoplastic immunotherapy: Secondary | ICD-10-CM | POA: Diagnosis not present

## 2018-07-16 DIAGNOSIS — Z79899 Other long term (current) drug therapy: Secondary | ICD-10-CM | POA: Diagnosis not present

## 2018-07-16 DIAGNOSIS — N289 Disorder of kidney and ureter, unspecified: Secondary | ICD-10-CM | POA: Diagnosis not present

## 2018-07-16 DIAGNOSIS — C3490 Malignant neoplasm of unspecified part of unspecified bronchus or lung: Secondary | ICD-10-CM

## 2018-07-16 DIAGNOSIS — J449 Chronic obstructive pulmonary disease, unspecified: Secondary | ICD-10-CM

## 2018-07-16 LAB — CBC WITH DIFFERENTIAL (CANCER CENTER ONLY)
Abs Immature Granulocytes: 0.01 10*3/uL (ref 0.00–0.07)
Basophils Absolute: 0 10*3/uL (ref 0.0–0.1)
Basophils Relative: 0 %
Eosinophils Absolute: 0.3 10*3/uL (ref 0.0–0.5)
Eosinophils Relative: 4 %
HCT: 37.8 % — ABNORMAL LOW (ref 39.0–52.0)
Hemoglobin: 12.2 g/dL — ABNORMAL LOW (ref 13.0–17.0)
Immature Granulocytes: 0 %
Lymphocytes Relative: 24 %
Lymphs Abs: 1.6 10*3/uL (ref 0.7–4.0)
MCH: 31.9 pg (ref 26.0–34.0)
MCHC: 32.3 g/dL (ref 30.0–36.0)
MCV: 98.7 fL (ref 80.0–100.0)
Monocytes Absolute: 0.5 10*3/uL (ref 0.1–1.0)
Monocytes Relative: 8 %
Neutro Abs: 4.3 10*3/uL (ref 1.7–7.7)
Neutrophils Relative %: 64 %
Platelet Count: 140 10*3/uL — ABNORMAL LOW (ref 150–400)
RBC: 3.83 MIL/uL — ABNORMAL LOW (ref 4.22–5.81)
RDW: 13.9 % (ref 11.5–15.5)
WBC Count: 6.7 10*3/uL (ref 4.0–10.5)
nRBC: 0 % (ref 0.0–0.2)

## 2018-07-16 LAB — CMP (CANCER CENTER ONLY)
ALT: 15 U/L (ref 0–44)
AST: 22 U/L (ref 15–41)
Albumin: 3.9 g/dL (ref 3.5–5.0)
Alkaline Phosphatase: 53 U/L (ref 38–126)
Anion gap: 13 (ref 5–15)
BUN: 20 mg/dL (ref 8–23)
CO2: 25 mmol/L (ref 22–32)
Calcium: 10 mg/dL (ref 8.9–10.3)
Chloride: 104 mmol/L (ref 98–111)
Creatinine: 1.22 mg/dL (ref 0.61–1.24)
GFR, Est AFR Am: >60 mL/min (ref >60)
GFR, Estimated: 55 mL/min — ABNORMAL LOW (ref >60)
Glucose, Bld: 123 mg/dL — ABNORMAL HIGH (ref 70–99)
Potassium: 3.9 mmol/L (ref 3.5–5.1)
Sodium: 142 mmol/L (ref 135–145)
Total Bilirubin: 0.6 mg/dL (ref 0.3–1.2)
Total Protein: 6.8 g/dL (ref 6.5–8.1)

## 2018-07-16 LAB — TSH: TSH: 1.154 u[IU]/mL (ref 0.320–4.118)

## 2018-07-16 MED ORDER — SODIUM CHLORIDE 0.9 % IV SOLN
Freq: Once | INTRAVENOUS | Status: AC
  Administered 2018-07-16: 09:00:00 via INTRAVENOUS
  Filled 2018-07-16: qty 250

## 2018-07-16 MED ORDER — SODIUM CHLORIDE 0.9 % IV SOLN
1200.0000 mg | Freq: Once | INTRAVENOUS | Status: AC
  Administered 2018-07-16: 1200 mg via INTRAVENOUS
  Filled 2018-07-16: qty 20

## 2018-07-16 NOTE — Progress Notes (Signed)
  [facility] OFFICE PROGRESS NOTE   Diagnosis: Small cell lung cancer  INTERVAL HISTORY:   Joseph Berg returns for a scheduled visit.  He completed a treatment with atezolizumab on 06/25/2018.  No rash or diarrhea.  He feels well.  No dyspnea.  No complaint.  Objective:  Vital signs in last 24 hours:  Blood pressure 136/67, pulse 80, temperature 98.5 F (36.9 C), temperature source Oral, resp. rate 18, height '5\' 9"'$  (1.753 m), weight 211 lb 3.2 oz (95.8 kg), SpO2 94 %.    Resp: Distant breath sounds with diminished breath sounds at the left compared to the right chest, no respiratory distress Cardio: Regular rate and rhythm GI: No hepatomegaly Vascular: Trace lower leg edema bilaterally, left greater than right  Skin: No rash, multiple benign-appearing moles over the trunk    Lab Results:  Lab Results  Component Value Date   WBC 6.7 07/16/2018   HGB 12.2 (L) 07/16/2018   HCT 37.8 (L) 07/16/2018   MCV 98.7 07/16/2018   PLT 140 (L) 07/16/2018   NEUTROABS 4.3 07/16/2018    CMP  Lab Results  Component Value Date   NA 142 07/16/2018   K 3.9 07/16/2018   CL 104 07/16/2018   CO2 25 07/16/2018   GLUCOSE 123 (H) 07/16/2018   BUN 20 07/16/2018   CREATININE 1.22 07/16/2018   CALCIUM 10.0 07/16/2018   PROT 6.8 07/16/2018   ALBUMIN 3.9 07/16/2018   AST 22 07/16/2018   ALT 15 07/16/2018   ALKPHOS 53 07/16/2018   BILITOT 0.6 07/16/2018   GFRNONAA 55 (L) 07/16/2018   GFRAA >60 07/16/2018     Medications: I have reviewed the patient's current medications.   Assessment/Plan: 1.  Extensive stage small cell lung cancer  CT chest 03/05/2018-left hilar mass, small mediastinal lymph nodes, atelectasis versus consolidation versus mass in the superior segment of the left lower lobe  Bone marrow biopsy 03/07/2018-extensive involvement of the bone marrow with metastatic small cell carcinoma consistent with a lung primary, small foci of non-small cell  differentiation  Cycle 1 etoposide/carboplatin 03/09/2018, G-CSF started 03/12/2018 and discontinued 03/20/2018  CT head 03/21/2018-no evidence of metastatic disease  Cycle 2 etoposide/carboplatin, dose escalation of etoposide and carboplatin 04/03/2018  Cycle 3 etoposide/carboplatin with addition of atezolizumab 04/30/2018  Cycle 4 etoposide/carboplatin/atezolizumab 05/28/2018  Restaging chest CT 06/20/2018- left hilar nodal mass no longer visualized/measurable.  Subpleural mass within the superior segment left lower lobe has decreased in size.  No new or progressive findings.  Every 3-week atezolizumab beginning 06/25/2018 2. Thrombocytopenia secondary to #1-improved 3.COPD 4.History of coronary artery disease 5.CHF 6.BPH 7.Admission with pneumonia December 2019 8.Mild renal insufficiency 9.Elevated liver enzymes 10. Coagulopathy 11. History of mild elevation of the calcium level 12. Anemia/leukopenia secondary to small cell lung cancer and chemotherapy-improved 13. Hypokalemia-likely secondary to furosemide therapy, potassium supplement started 04/03/2018 14.Admission 04/19/2018 with left lung pneumonia    Disposition: Joseph Berg appears stable.  There is no clinical evidence for progression of the small cell lung cancer.  He will continue every 3-week atezolizumab.  He will return for an office and lab visit in 3 weeks.  Joseph Coder, MD  07/16/2018  8:30 AM

## 2018-07-16 NOTE — Patient Instructions (Signed)
[facility] Discharge Instructions for Patients Receiving Chemotherapy  Today you received the following chemotherapy agents: Tecentriq  To help prevent nausea and vomiting after your treatment, we encourage you to take your nausea medication as directed.   If you develop nausea and vomiting that is not controlled by your nausea medication, call the clinic.   BELOW ARE SYMPTOMS THAT SHOULD BE REPORTED IMMEDIATELY:  *FEVER GREATER THAN 100.5 F  *CHILLS WITH OR WITHOUT FEVER  NAUSEA AND VOMITING THAT IS NOT CONTROLLED WITH YOUR NAUSEA MEDICATION  *UNUSUAL SHORTNESS OF BREATH  *UNUSUAL BRUISING OR BLEEDING  TENDERNESS IN MOUTH AND THROAT WITH OR WITHOUT PRESENCE OF ULCERS  *URINARY PROBLEMS  *BOWEL PROBLEMS  UNUSUAL RASH Items with * indicate a potential emergency and should be followed up as soon as possible.  Feel free to call the clinic should you have any questions or concerns. The clinic phone number is [phone].  Please show the CHEMO ALERT CARD at check-in to the Emergency Department and triage nurse.   

## 2018-07-16 NOTE — Telephone Encounter (Signed)
Scheduled appt per 6/8 los. Left a voice message of appt date and time.

## 2018-07-19 ENCOUNTER — Telehealth: Payer: Self-pay

## 2018-07-19 NOTE — Telephone Encounter (Signed)
Left a detailed message for the patient about switching his upcoming appointment to a virtual visit with Dr. Stanford Breed for the same day and time due to COVID-19

## 2018-07-25 ENCOUNTER — Ambulatory Visit: Payer: Medicare PPO | Admitting: Cardiology

## 2018-07-29 DIAGNOSIS — J449 Chronic obstructive pulmonary disease, unspecified: Secondary | ICD-10-CM | POA: Diagnosis not present

## 2018-07-29 DIAGNOSIS — I5022 Chronic systolic (congestive) heart failure: Secondary | ICD-10-CM | POA: Diagnosis not present

## 2018-07-29 DIAGNOSIS — C3490 Malignant neoplasm of unspecified part of unspecified bronchus or lung: Secondary | ICD-10-CM | POA: Diagnosis not present

## 2018-07-29 DIAGNOSIS — J439 Emphysema, unspecified: Secondary | ICD-10-CM | POA: Diagnosis not present

## 2018-08-05 ENCOUNTER — Other Ambulatory Visit: Payer: Self-pay | Admitting: Oncology

## 2018-08-06 ENCOUNTER — Other Ambulatory Visit: Payer: Self-pay

## 2018-08-06 ENCOUNTER — Inpatient Hospital Stay: Payer: Medicare PPO

## 2018-08-06 ENCOUNTER — Inpatient Hospital Stay (HOSPITAL_BASED_OUTPATIENT_CLINIC_OR_DEPARTMENT_OTHER): Payer: Medicare PPO | Admitting: Oncology

## 2018-08-06 VITALS — BP 128/69 | HR 79 | Temp 99.1°F | Resp 18 | Ht 69.0 in | Wt 212.0 lb

## 2018-08-06 DIAGNOSIS — J449 Chronic obstructive pulmonary disease, unspecified: Secondary | ICD-10-CM | POA: Diagnosis not present

## 2018-08-06 DIAGNOSIS — C3490 Malignant neoplasm of unspecified part of unspecified bronchus or lung: Secondary | ICD-10-CM

## 2018-08-06 DIAGNOSIS — I509 Heart failure, unspecified: Secondary | ICD-10-CM | POA: Diagnosis not present

## 2018-08-06 DIAGNOSIS — D630 Anemia in neoplastic disease: Secondary | ICD-10-CM | POA: Diagnosis not present

## 2018-08-06 DIAGNOSIS — C7952 Secondary malignant neoplasm of bone marrow: Secondary | ICD-10-CM

## 2018-08-06 DIAGNOSIS — D6959 Other secondary thrombocytopenia: Secondary | ICD-10-CM

## 2018-08-06 DIAGNOSIS — N289 Disorder of kidney and ureter, unspecified: Secondary | ICD-10-CM | POA: Diagnosis not present

## 2018-08-06 DIAGNOSIS — D6481 Anemia due to antineoplastic chemotherapy: Secondary | ICD-10-CM | POA: Diagnosis not present

## 2018-08-06 DIAGNOSIS — M79662 Pain in left lower leg: Secondary | ICD-10-CM

## 2018-08-06 DIAGNOSIS — C3432 Malignant neoplasm of lower lobe, left bronchus or lung: Secondary | ICD-10-CM

## 2018-08-06 DIAGNOSIS — I2510 Atherosclerotic heart disease of native coronary artery without angina pectoris: Secondary | ICD-10-CM | POA: Diagnosis not present

## 2018-08-06 DIAGNOSIS — Z5112 Encounter for antineoplastic immunotherapy: Secondary | ICD-10-CM | POA: Diagnosis not present

## 2018-08-06 DIAGNOSIS — Z79899 Other long term (current) drug therapy: Secondary | ICD-10-CM | POA: Diagnosis not present

## 2018-08-06 DIAGNOSIS — D689 Coagulation defect, unspecified: Secondary | ICD-10-CM

## 2018-08-06 LAB — CMP (CANCER CENTER ONLY)
ALT: 19 U/L (ref 0–44)
AST: 22 U/L (ref 15–41)
Albumin: 3.8 g/dL (ref 3.5–5.0)
Alkaline Phosphatase: 57 U/L (ref 38–126)
Anion gap: 10 (ref 5–15)
BUN: 15 mg/dL (ref 8–23)
CO2: 29 mmol/L (ref 22–32)
Calcium: 10.1 mg/dL (ref 8.9–10.3)
Chloride: 104 mmol/L (ref 98–111)
Creatinine: 1.25 mg/dL — ABNORMAL HIGH (ref 0.61–1.24)
GFR, Est AFR Am: >60 mL/min (ref >60)
GFR, Estimated: 53 mL/min — ABNORMAL LOW (ref >60)
Glucose, Bld: 123 mg/dL — ABNORMAL HIGH (ref 70–99)
Potassium: 3.9 mmol/L (ref 3.5–5.1)
Sodium: 143 mmol/L (ref 135–145)
Total Bilirubin: 0.5 mg/dL (ref 0.3–1.2)
Total Protein: 6.6 g/dL (ref 6.5–8.1)

## 2018-08-06 LAB — CBC WITH DIFFERENTIAL (CANCER CENTER ONLY)
Abs Immature Granulocytes: 0.02 10*3/uL (ref 0.00–0.07)
Basophils Absolute: 0 10*3/uL (ref 0.0–0.1)
Basophils Relative: 0 %
Eosinophils Absolute: 0.1 10*3/uL (ref 0.0–0.5)
Eosinophils Relative: 2 %
HCT: 40 % (ref 39.0–52.0)
Hemoglobin: 13.1 g/dL (ref 13.0–17.0)
Immature Granulocytes: 0 %
Lymphocytes Relative: 28 %
Lymphs Abs: 2 10*3/uL (ref 0.7–4.0)
MCH: 31.5 pg (ref 26.0–34.0)
MCHC: 32.8 g/dL (ref 30.0–36.0)
MCV: 96.2 fL (ref 80.0–100.0)
Monocytes Absolute: 0.6 10*3/uL (ref 0.1–1.0)
Monocytes Relative: 8 %
Neutro Abs: 4.5 10*3/uL (ref 1.7–7.7)
Neutrophils Relative %: 62 %
Platelet Count: 133 10*3/uL — ABNORMAL LOW (ref 150–400)
RBC: 4.16 MIL/uL — ABNORMAL LOW (ref 4.22–5.81)
RDW: 13.1 % (ref 11.5–15.5)
WBC Count: 7.3 10*3/uL (ref 4.0–10.5)
nRBC: 0 % (ref 0.0–0.2)

## 2018-08-06 MED ORDER — SODIUM CHLORIDE 0.9 % IV SOLN
1200.0000 mg | Freq: Once | INTRAVENOUS | Status: AC
  Administered 2018-08-06: 1200 mg via INTRAVENOUS
  Filled 2018-08-06: qty 20

## 2018-08-06 MED ORDER — SODIUM CHLORIDE 0.9 % IV SOLN
Freq: Once | INTRAVENOUS | Status: AC
  Administered 2018-08-06: 12:00:00 via INTRAVENOUS
  Filled 2018-08-06: qty 250

## 2018-08-06 NOTE — Patient Instructions (Signed)
[facility] Discharge Instructions for Patients Receiving Chemotherapy  Today you received the following chemotherapy agents: Tecentriq  To help prevent nausea and vomiting after your treatment, we encourage you to take your nausea medication as directed.   If you develop nausea and vomiting that is not controlled by your nausea medication, call the clinic.   BELOW ARE SYMPTOMS THAT SHOULD BE REPORTED IMMEDIATELY:  *FEVER GREATER THAN 100.5 F  *CHILLS WITH OR WITHOUT FEVER  NAUSEA AND VOMITING THAT IS NOT CONTROLLED WITH YOUR NAUSEA MEDICATION  *UNUSUAL SHORTNESS OF BREATH  *UNUSUAL BRUISING OR BLEEDING  TENDERNESS IN MOUTH AND THROAT WITH OR WITHOUT PRESENCE OF ULCERS  *URINARY PROBLEMS  *BOWEL PROBLEMS  UNUSUAL RASH Items with * indicate a potential emergency and should be followed up as soon as possible.  Feel free to call the clinic should you have any questions or concerns. The clinic phone number is [phone].  Please show the CHEMO ALERT CARD at check-in to the Emergency Department and triage nurse.   

## 2018-08-06 NOTE — Progress Notes (Signed)
  [place] OFFICE PROGRESS NOTE   Diagnosis: Small cell lung cancer  INTERVAL HISTORY:   [name] returns as scheduled.  He completed another treatment with atezolizumab on 07/16/2018.  No rash or diarrhea.  Good appetite.  No significant dyspnea.  He reports discomfort in the left calf with ambulation for greater than a month.  He had a negative Doppler on 06/20/2018.  Objective:  Vital signs in last 24 hours:  Blood pressure 128/69, pulse 79, temperature 99.1 F (37.3 C), temperature source Temporal, resp. rate 18, height '5\' 9"'$  (1.753 m), weight 212 lb (96.2 kg), SpO2 93 %.  Resp: Distant breath sounds with a decrease of the left upper posterior chest, no respiratory distress Cardio: Regular rate and rhythm Vascular: Trace lower leg edema bilaterally, no palpable cord, no tenderness at the left calf     Lab Results:  Lab Results  Component Value Date   WBC 7.3 08/06/2018   HGB 13.1 08/06/2018   HCT 40.0 08/06/2018   MCV 96.2 08/06/2018   PLT 133 (L) 08/06/2018   NEUTROABS 4.5 08/06/2018    CMP  Lab Results  Component Value Date   NA 143 08/06/2018   K 3.9 08/06/2018   CL 104 08/06/2018   CO2 29 08/06/2018   GLUCOSE 123 (H) 08/06/2018   BUN 15 08/06/2018   CREATININE 1.25 (H) 08/06/2018   CALCIUM 10.1 08/06/2018   PROT 6.6 08/06/2018   ALBUMIN 3.8 08/06/2018   AST 22 08/06/2018   ALT 19 08/06/2018   ALKPHOS 57 08/06/2018   BILITOT 0.5 08/06/2018   GFRNONAA 53 (L) 08/06/2018   GFRAA >60 08/06/2018    No results found for: CEA1   Medications: I have reviewed the patient's current medications.   Assessment/Plan:  1.  Extensive stage small cell lung cancer  CT chest 03/05/2018-left hilar mass, small mediastinal lymph nodes, atelectasis versus consolidation versus mass in the superior segment of the left lower lobe  Bone marrow biopsy 03/07/2018-extensive involvement of the bone marrow with metastatic small cell carcinoma consistent with  a lung primary, small foci of non-small cell differentiation  Cycle 1 etoposide/carboplatin 03/09/2018, G-CSF started 03/12/2018 and discontinued 03/20/2018  CT head 03/21/2018-no evidence of metastatic disease  Cycle 2 etoposide/carboplatin, dose escalation of etoposide and carboplatin 04/03/2018  Cycle 3 etoposide/carboplatin with addition of atezolizumab 04/30/2018  Cycle 4 etoposide/carboplatin/atezolizumab 05/28/2018  Restaging chest CT 06/20/2018- left hilar nodal mass no longer visualized/measurable.  Subpleural mass within the superior segment left lower lobe has decreased in size.  No new or progressive findings.  Every 3-week atezolizumab beginning 06/25/2018 2. Thrombocytopenia secondary to #1-improved 3.COPD 4.History of coronary artery disease 5.CHF 6.BPH 7.Admission with pneumonia December 2019 8.Mild renal insufficiency 9.Elevated liver enzymes 10. Coagulopathy 11. History of mild elevation of the calcium level 12. Anemia/leukopenia secondary to small cell lung cancer and chemotherapy-improved 13. Hypokalemia-likely secondary to furosemide therapy, potassium supplement started 04/03/2018 14.Admission 04/19/2018 with left lung pneumonia      Disposition: [name] appears unchanged.  The left calf discomfort may be related to claudication.  He will contact us for swelling or increased pain and we will schedule a repeat Doppler.  [name] will continue every 3-week atezolizumab.  He will return for an office visit in 3 weeks.  [name], MD  08/06/2018  11:07 AM

## 2018-08-07 ENCOUNTER — Telehealth: Payer: Self-pay | Admitting: Oncology

## 2018-08-07 NOTE — Telephone Encounter (Signed)
Called and left msg for patient  °

## 2018-08-14 NOTE — Progress Notes (Signed)
HPI:  FU CHF and CAD. Patient had a myocardial infarction followed by coronary artery bypassing graft in 1994 in [place]. Cardiac catheterization in June of 2014 showed a 90% left main, occluded LAD, normal ramus, occluded circumflex and occluded right coronary artery. The LIMA to the LAD was patent. Ejection fraction was 35-40%. The patient underwent redo coronary artery bypass and graft in July of 2014 with a saphenous vein graft to the ramus and a saphenous vein graft to the PDA. ABIs October 2016 showed moderate insufficiency on the right and normal left.  Carotid Dopplers June 2018 showed less than 50% bilateral stenosis. Abdominal ultrasound June 2019 showed 4.3 cm aneurysm. Echocardiogram December 2019 showed ejection fraction 50 to 55%.  Pt being treated for small cell lung cancer.  Since I last saw him,patient denies increased dyspnea, chest pain, palpitations or syncope.  Minimal pedal edema.  Current Outpatient Medications  Medication Sig Dispense Refill   AMBULATORY NON FORMULARY MEDICATION Nebulizer - use as needed - with supplies.  Diagnosis of COPD J44.1 1 each 0   Aspirin Buf,CaCarb-MgCarb-MgO, 81 MG TABS Take 81 mg by mouth daily.     calcium-vitamin D (OSCAL 500/200 D-3) 500-200 MG-UNIT tablet Take 1 tablet by mouth 2 (two) times daily. 180 tablet 3   carvedilol (COREG) 12.5 MG tablet Take 1 tablet (12.5 mg total) by mouth 2 (two) times daily with a meal. MUST KEEP APPOINTMENT 07/25/18 WITH DR Caitland Porchia FOR FUTURE REFILLS 180 tablet 0   cholecalciferol (VITAMIN D3) 25 MCG (1000 UT) tablet Take 1,000 Units by mouth daily.     cilostazol (PLETAL) 100 MG tablet Take 100 mg by mouth daily.     Cyanocobalamin (VITAMIN B-12 PO) Take 1 tablet by mouth daily.     finasteride (PROSCAR) 5 MG tablet Take 1 tablet (5 mg total) by mouth every evening. 90 tablet 1   Fluticasone-Umeclidin-Vilant (TRELEGY ELLIPTA) 100-62.5-25 MCG/INH AEPB Inhale 1 Inhaler into the lungs daily. 3  each 1   furosemide (LASIX) 40 MG tablet Take 1 tablet (40 mg total) by mouth daily. 90 tablet 0   ipratropium-albuterol (DUONEB) 0.5-2.5 (3) MG/3ML SOLN Take 3 mLs by nebulization every 2 (two) hours as needed (sob).      loperamide (IMODIUM A-D) 2 MG tablet Take 2 mg by mouth 4 (four) times daily as needed for diarrhea or loose stools.     Multiple Vitamin (MULTIVITAMIN) capsule Take 1 capsule by mouth daily.     Probiotic Product (TRUBIOTICS PO) Take 1 tablet by mouth daily.     rosuvastatin (CRESTOR) 40 MG tablet Take 1 tablet (40 mg total) by mouth daily. 90 tablet 30   VENTOLIN HFA 108 (90 Base) MCG/ACT inhaler TAKE 2 PUFFS BY MOUTH EVERY 6 HOURS AS NEEDED FOR WHEEZE OR SHORTNESS OF BREATH 18 Inhaler 2   vitamin C (ASCORBIC ACID) 250 MG tablet Take 750 mg by mouth daily.     No current facility-administered medications for this visit.      Past Medical History:  Diagnosis Date   BPH (benign prostatic hyperplasia)    CAD (coronary artery disease)    a. s/p CABG x1 with LIMA-LAD 1994. b. LM & 3v CAD by cath 07/2012   CHF (congestive heart failure) ([place])    a. EF 35-40% by echo 07/2012.   Complication of anesthesia    affected patients memory. Pt stated "I couldn't remember anything for three months...just bits and pieces"   Emphysema  a. Moderate emphysema by CT 06/2012.   Heart attack ([place]) 1994   Hyperlipidemia    Hypertension    Hypoparathyroidism ([place])    NSVT (nonsustained ventricular tachycardia) ([place])    a. Seen on tele 07/2012.   OSA on CPAP    pt stated he does not wear CPAP because he no longer has sleep apnea   Pneumonia    Pseudoaneurysm of left femoral artery ([place])    a. post cath s/p compression 07/2012, associated w/ anemia.   Sciatica 2012   Qualifier: Diagnosis of  By: Madilyn Fireman MD, Barnetta Chapel      Past Surgical History:  Procedure Laterality Date   CATARACT EXTRACTION, BILATERAL     COLONOSCOPY     CORONARY ARTERY BYPASS GRAFT   03-23-92   CORONARY ARTERY BYPASS GRAFT N/A 08/14/2012   Procedure: REDO CORONARY ARTERY BYPASS GRAFTING (CABG);  Surgeon: Gaye Pollack, MD;  Location: [facility];  Service: Open Heart Surgery;  Laterality: N/A;   INTRAOPERATIVE TRANSESOPHAGEAL ECHOCARDIOGRAM N/A 08/14/2012   Procedure: INTRAOPERATIVE TRANSESOPHAGEAL ECHOCARDIOGRAM;  Surgeon: Gaye Pollack, MD;  Location: [facility];  Service: Open Heart Surgery;  Laterality: N/A;   LUMBAR LAMINECTOMY/DECOMPRESSION MICRODISCECTOMY Left 02/18/2015   Procedure: LUMBAR LAMINECTOMY/DECOMPRESSION MICRODISCECTOMY- LEFT FOUR-FIVE ;  Surgeon: Ashok Pall, MD;  Location: [facility];  Service: Neurosurgery;  Laterality: Left;    Social History   Socioeconomic History   Marital status: Married    Spouse name: Not on file   Number of children: Not on file   Years of education: Not on file   Highest education level: Not on file  Occupational History   Not on file  Social Needs   Financial resource strain: Not on file   Food insecurity    Worry: Not on file    Inability: Not on file   Transportation needs    Medical: Not on file    Non-medical: Not on file  Tobacco Use   Smoking status: Current Some Day Smoker    Packs/day: 1.00    Years: 60.00    Pack years: 60.00    Types: Cigarettes, Pipe   Smokeless tobacco: Never Used   Tobacco comment: 01/11/2018 "smokes cigars only now"  Substance and Sexual Activity   Alcohol use: Yes    Comment: Occasional   Drug use: No   Sexual activity: Not on file    Comment: works part-time, [employer] completed H, married, no children, regular exercise.S  Lifestyle   Physical activity    Days per week: Not on file    Minutes per session: Not on file   Stress: Not on file  Relationships   Social connections    Talks on phone: Not on file    Gets together: Not on file    Attends religious service: Not on file    Active member of club or organization: Not on file    Attends  meetings of clubs or organizations: Not on file    Relationship status: Not on file   Intimate partner violence    Fear of current or ex partner: Not on file    Emotionally abused: Not on file    Physically abused: Not on file    Forced sexual activity: Not on file  Other Topics Concern   Not on file  Social History Narrative   Not on file    Family History  Problem Relation Age of Onset   Heart attack Father 24   Stroke Brother 57  Hypertension Brother    Hyperlipidemia Brother     ROS: no fevers or chills, productive cough, hemoptysis, dysphasia, odynophagia, melena, hematochezia, dysuria, hematuria, rash, seizure activity, orthopnea, PND, claudication. Remaining systems are negative.  Physical Exam: Well-developed well-nourished in no acute distress.  Skin is warm and dry.  HEENT is normal.  Neck is supple.  Chest is clear to auscultation with normal expansion.  Cardiovascular exam is regular rate and rhythm.  Abdominal exam nontender or distended. No masses palpated. Extremities show trace edema. neuro grossly intact  ECG-sinus rhythm with PVCs, personally reviewed  A/P  1 coronary artery disease status post coronary artery bypass graft-patient is doing well and denies chest pain.  Plan to continue medical therapy with aspirin and statin.  2 ischemic cardiomyopathy-continue beta-blocker.  Resume losartan 25 mg daily.  Check potassium and renal function in 1 week.  He appears to be euvolemic.  Continue present dose of Lasix.  We discussed fluid restriction and low-sodium diet.  3 abdominal aortic aneurysm-schedule follow-up ultrasound.  4 hypertension-patient's blood pressure is controlled.  Continue present medications and follow.  5 hyperlipidemia-continue statin.  6 carotid artery disease-mild on most recent Dopplers.  Continue aspirin and statin.  Kirk Ruths, MD

## 2018-08-15 ENCOUNTER — Encounter: Payer: Self-pay | Admitting: Cardiology

## 2018-08-15 ENCOUNTER — Ambulatory Visit (INDEPENDENT_AMBULATORY_CARE_PROVIDER_SITE_OTHER): Payer: Medicare PPO | Admitting: Cardiology

## 2018-08-15 ENCOUNTER — Other Ambulatory Visit: Payer: Self-pay

## 2018-08-15 VITALS — BP 126/71 | HR 84 | Ht 69.0 in | Wt 208.8 lb

## 2018-08-15 DIAGNOSIS — I714 Abdominal aortic aneurysm, without rupture, unspecified: Secondary | ICD-10-CM

## 2018-08-15 DIAGNOSIS — I255 Ischemic cardiomyopathy: Secondary | ICD-10-CM

## 2018-08-15 DIAGNOSIS — I2510 Atherosclerotic heart disease of native coronary artery without angina pectoris: Secondary | ICD-10-CM | POA: Diagnosis not present

## 2018-08-15 MED ORDER — FUROSEMIDE 40 MG PO TABS: 40.0000 mg | ORAL_TABLET | Freq: Every day | ORAL | 3 refills | Status: DC

## 2018-08-15 MED ORDER — ROSUVASTATIN CALCIUM 40 MG PO TABS: 40.0000 mg | ORAL_TABLET | Freq: Every day | ORAL | 3 refills | Status: DC

## 2018-08-15 MED ORDER — CARVEDILOL 12.5 MG PO TABS: 12.5000 mg | ORAL_TABLET | Freq: Two times a day (BID) | ORAL | 3 refills | Status: AC

## 2018-08-15 MED ORDER — LOSARTAN POTASSIUM 25 MG PO TABS: 25.0000 mg | ORAL_TABLET | Freq: Every day | ORAL | 3 refills | Status: DC

## 2018-08-15 NOTE — Patient Instructions (Signed)
Medication Instructions:  START LOSARTAN 25 MG ONCE DAILY If you need a refill on your cardiac medications before your next appointment, please call your pharmacy.   Lab work: Your physician recommends that you return for lab work in:ONE WEEK If you have labs (blood work) drawn today and your tests are completely normal, you will receive your results only by: [name] MyChart Message (if you have MyChart) OR . A paper copy in the mail If you have any lab test that is abnormal or we need to change your treatment, we will call you to review the results.  Testing/Procedures: Your physician has requested that you have an abdominal aorta duplex. During this test, an ultrasound is used to evaluate the aorta. Allow 30 minutes for this exam. Do not eat after midnight the day before and avoid carbonated beverages AT THE [place] OFFICE  Follow-Up: At [facility], you and your health needs are our priority.  As part of our continuing mission to provide you with exceptional heart care, we have created designated Provider Care Teams.  These Care Teams include your primary Cardiologist (physician) and Advanced Practice Providers (APPs -  Physician Assistants and Nurse Practitioners) who all work together to provide you with the care you need, when you need it. You will need a follow up appointment in 6 months.  Please call our office 2 months in advance to schedule this appointment.  You will see Kirk Ruths, MD

## 2018-08-24 ENCOUNTER — Telehealth: Payer: Self-pay | Admitting: Cardiology

## 2018-08-24 ENCOUNTER — Other Ambulatory Visit: Payer: Self-pay | Admitting: Emergency Medicine

## 2018-08-24 DIAGNOSIS — I714 Abdominal aortic aneurysm, without rupture, unspecified: Secondary | ICD-10-CM

## 2018-08-24 DIAGNOSIS — I255 Ischemic cardiomyopathy: Secondary | ICD-10-CM | POA: Diagnosis not present

## 2018-08-24 NOTE — Telephone Encounter (Signed)
New message   Patient's wife states that there needs to be order put in for an aortic test. Please call to discuss.

## 2018-08-24 NOTE — Telephone Encounter (Signed)
Called patient and informed her the order is in for the aortic abdominal aneurysm ultrasound, I released this so she can have scheduled. No further questions.

## 2018-08-25 LAB — BASIC METABOLIC PANEL
BUN/Creatinine Ratio: 14 (calc) (ref 6–22)
BUN: 18 mg/dL (ref 7–25)
CO2: 31 mmol/L (ref 20–32)
Calcium: 10.2 mg/dL (ref 8.6–10.3)
Chloride: 105 mmol/L (ref 98–110)
Creat: 1.3 mg/dL — ABNORMAL HIGH (ref 0.70–1.11)
Glucose, Bld: 114 mg/dL — ABNORMAL HIGH (ref 65–99)
Potassium: 3.6 mmol/L (ref 3.5–5.3)
Sodium: 143 mmol/L (ref 135–146)

## 2018-08-26 ENCOUNTER — Other Ambulatory Visit: Payer: Self-pay | Admitting: Oncology

## 2018-08-27 ENCOUNTER — Encounter: Payer: Self-pay | Admitting: *Deleted

## 2018-08-27 ENCOUNTER — Inpatient Hospital Stay: Payer: Medicare PPO

## 2018-08-27 ENCOUNTER — Ambulatory Visit: Payer: Medicare PPO

## 2018-08-27 ENCOUNTER — Other Ambulatory Visit: Payer: Self-pay

## 2018-08-27 ENCOUNTER — Inpatient Hospital Stay: Payer: Medicare PPO | Attending: Oncology | Admitting: Oncology

## 2018-08-27 ENCOUNTER — Telehealth: Payer: Self-pay | Admitting: Oncology

## 2018-08-27 VITALS — BP 129/67 | HR 92 | Temp 98.7°F | Resp 18 | Ht 69.0 in | Wt 210.8 lb

## 2018-08-27 DIAGNOSIS — Z5112 Encounter for antineoplastic immunotherapy: Secondary | ICD-10-CM | POA: Diagnosis not present

## 2018-08-27 DIAGNOSIS — E876 Hypokalemia: Secondary | ICD-10-CM

## 2018-08-27 DIAGNOSIS — I509 Heart failure, unspecified: Secondary | ICD-10-CM | POA: Diagnosis not present

## 2018-08-27 DIAGNOSIS — I2510 Atherosclerotic heart disease of native coronary artery without angina pectoris: Secondary | ICD-10-CM

## 2018-08-27 DIAGNOSIS — C7952 Secondary malignant neoplasm of bone marrow: Secondary | ICD-10-CM | POA: Diagnosis not present

## 2018-08-27 DIAGNOSIS — D630 Anemia in neoplastic disease: Secondary | ICD-10-CM | POA: Diagnosis not present

## 2018-08-27 DIAGNOSIS — D6959 Other secondary thrombocytopenia: Secondary | ICD-10-CM

## 2018-08-27 DIAGNOSIS — C3432 Malignant neoplasm of lower lobe, left bronchus or lung: Secondary | ICD-10-CM | POA: Diagnosis not present

## 2018-08-27 DIAGNOSIS — N289 Disorder of kidney and ureter, unspecified: Secondary | ICD-10-CM

## 2018-08-27 DIAGNOSIS — J449 Chronic obstructive pulmonary disease, unspecified: Secondary | ICD-10-CM

## 2018-08-27 DIAGNOSIS — D701 Agranulocytosis secondary to cancer chemotherapy: Secondary | ICD-10-CM | POA: Diagnosis not present

## 2018-08-27 DIAGNOSIS — D6481 Anemia due to antineoplastic chemotherapy: Secondary | ICD-10-CM | POA: Diagnosis not present

## 2018-08-27 DIAGNOSIS — M79662 Pain in left lower leg: Secondary | ICD-10-CM

## 2018-08-27 DIAGNOSIS — C3490 Malignant neoplasm of unspecified part of unspecified bronchus or lung: Secondary | ICD-10-CM

## 2018-08-27 DIAGNOSIS — D689 Coagulation defect, unspecified: Secondary | ICD-10-CM

## 2018-08-27 MED ORDER — SODIUM CHLORIDE 0.9 % IV SOLN
Freq: Once | INTRAVENOUS | Status: AC
  Administered 2018-08-27: 09:00:00 via INTRAVENOUS
  Filled 2018-08-27: qty 250

## 2018-08-27 MED ORDER — SODIUM CHLORIDE 0.9 % IV SOLN
1200.0000 mg | Freq: Once | INTRAVENOUS | Status: AC
  Administered 2018-08-27: 1200 mg via INTRAVENOUS
  Filled 2018-08-27: qty 20

## 2018-08-27 NOTE — Progress Notes (Signed)
[name]  [place] OFFICE PROGRESS NOTE   Diagnosis: Small cell lung cancer  INTERVAL HISTORY:   Joseph Berg returns for scheduled visit.  He completed another treatment with atezolizumab on 08/06/2018.  No rash or diarrhea.  He has intermittent discomfort in the left lower leg with ambulation.  No other complaint.  Objective:  Vital signs in last 24 hours:  Blood pressure 129/67, pulse 92, temperature 98.7 F (37.1 C), temperature source Oral, resp. rate 18, height _0  (1.753 m), weight 210 lb 12.8 oz (95.6 kg), SpO2 93 %.     GI: No hepatomegaly, nontender Vascular: Trace edema at the left greater than right lower leg and ankle     Lab Results:  Lab Results  Component Value Date   WBC 7.3 08/06/2018   HGB 13.1 08/06/2018   HCT 40.0 08/06/2018   MCV 96.2 08/06/2018   PLT 133 (L) 08/06/2018   NEUTROABS 4.5 08/06/2018    CMP  Lab Results  Component Value Date   NA 143 08/24/2018   K 3.6 08/24/2018   CL 105 08/24/2018   CO2 31 08/24/2018   GLUCOSE 114 (H) 08/24/2018   BUN 18 08/24/2018   CREATININE 1.30 (H) 08/24/2018   CALCIUM 10.2 08/24/2018   PROT 6.6 08/06/2018   ALBUMIN 3.8 08/06/2018   AST 22 08/06/2018   ALT 19 08/06/2018   ALKPHOS 57 08/06/2018   BILITOT 0.5 08/06/2018   GFRNONAA 53 (L) 08/06/2018   GFRAA >60 08/06/2018     Medications: I have reviewed the patient's current medications.   Assessment/Plan: 1.   Extensive stage small cell lung cancer  CT chest 03/05/2018-left hilar mass, small mediastinal lymph nodes, atelectasis versus consolidation versus mass in the superior segment of the left lower lobe  Bone marrow biopsy 03/07/2018-extensive involvement of the bone marrow with metastatic small cell carcinoma consistent with a lung primary, small foci of non-small cell differentiation  Cycle 1 etoposide/carboplatin 03/09/2018, G-CSF started 03/12/2018 and discontinued 03/20/2018  CT head 03/21/2018-no evidence of metastatic  disease  Cycle 2 etoposide/carboplatin, dose escalation of etoposide and carboplatin 04/03/2018  Cycle 3 etoposide/carboplatin with addition of atezolizumab 04/30/2018  Cycle 4 etoposide/carboplatin/atezolizumab 05/28/2018  Restaging chest CT 06/20/2018- left hilar nodal mass no longer visualized/measurable.  Subpleural mass within the superior segment left lower lobe has decreased in size.  No new or progressive findings.  Every 3-week atezolizumab beginning 06/25/2018 2. Thrombocytopenia secondary to #1-improved 3.COPD 4.History of coronary artery disease 5.CHF 6.BPH 7.Admission with pneumonia December 2019 8.Mild renal insufficiency 9.Elevated liver enzymes 10. Coagulopathy 11. History of mild elevation of the calcium level 12. Anemia/leukopenia secondary to small cell lung cancer and chemotherapy-improved 13. Hypokalemia-likely secondary to furosemide therapy, potassium supplement started 04/03/2018 14.Admission 04/19/2018 with left lung pneumonia    Disposition: Joseph Berg appears unchanged.  He will complete another treatment with atezolizumab today.  He will return for an office visit and atezolizumab in 3 weeks. I discussed his status and treatment plan with his wife by telephone.  He will be scheduled for a restaging chest CT in late August or early September.  [name], MD  08/27/2018  8:23 AM

## 2018-08-27 NOTE — Patient Instructions (Signed)
[place] [place] [facility] Discharge Instructions for Patients Receiving Chemotherapy  Today you received the following chemotherapy agent: Tecentriq.  To help prevent nausea and vomiting after your treatment, we encourage you to take your nausea medication as directed.   If you develop nausea and vomiting that is not controlled by your nausea medication, call the clinic.   BELOW ARE SYMPTOMS THAT SHOULD BE REPORTED IMMEDIATELY:  *FEVER GREATER THAN 100.5 F  *CHILLS WITH OR WITHOUT FEVER  NAUSEA AND VOMITING THAT IS NOT CONTROLLED WITH YOUR NAUSEA MEDICATION  *UNUSUAL SHORTNESS OF BREATH  *UNUSUAL BRUISING OR BLEEDING  TENDERNESS IN MOUTH AND THROAT WITH OR WITHOUT PRESENCE OF ULCERS  *URINARY PROBLEMS  *BOWEL PROBLEMS  UNUSUAL RASH Items with * indicate a potential emergency and should be followed up as soon as possible.  Feel free to call the clinic should you have any questions or concerns. The clinic phone number is [phone].  Please show the [place] at check-in to the Emergency Department and triage nurse.

## 2018-08-27 NOTE — Telephone Encounter (Signed)
Called and spoke with patients wife. Confirmed appts

## 2018-08-27 NOTE — Progress Notes (Signed)
Per Dr. Benay Spice : no labs are required for today's treatment.

## 2018-08-28 DIAGNOSIS — I5022 Chronic systolic (congestive) heart failure: Secondary | ICD-10-CM | POA: Diagnosis not present

## 2018-08-28 DIAGNOSIS — C3490 Malignant neoplasm of unspecified part of unspecified bronchus or lung: Secondary | ICD-10-CM | POA: Diagnosis not present

## 2018-08-28 DIAGNOSIS — J449 Chronic obstructive pulmonary disease, unspecified: Secondary | ICD-10-CM | POA: Diagnosis not present

## 2018-08-28 DIAGNOSIS — J439 Emphysema, unspecified: Secondary | ICD-10-CM | POA: Diagnosis not present

## 2018-09-04 NOTE — Telephone Encounter (Signed)
Follow Up   Patient's wife is calling back due to still not being able to scheduled aortic aneurysm ultrasound. There is still no order to schedule from. Please assist and give patient's wife a call back.

## 2018-09-05 NOTE — Telephone Encounter (Signed)
Spoke with pt wife, order placed [place] and scheduled for the patient.

## 2018-09-07 ENCOUNTER — Encounter (HOSPITAL_COMMUNITY): Payer: Self-pay | Admitting: Emergency Medicine

## 2018-09-07 ENCOUNTER — Inpatient Hospital Stay (HOSPITAL_COMMUNITY)
Admission: EM | Admit: 2018-09-07 | Discharge: 2018-09-14 | DRG: 542 | Disposition: A | Discharge status: Hospice | Payer: Medicare PPO | Attending: Internal Medicine | Admitting: Internal Medicine

## 2018-09-07 ENCOUNTER — Emergency Department (HOSPITAL_COMMUNITY): Payer: Medicare PPO

## 2018-09-07 ENCOUNTER — Telehealth: Payer: Self-pay | Admitting: *Deleted

## 2018-09-07 ENCOUNTER — Other Ambulatory Visit: Payer: Self-pay

## 2018-09-07 DIAGNOSIS — I130 Hypertensive heart and chronic kidney disease with heart failure and stage 1 through stage 4 chronic kidney disease, or unspecified chronic kidney disease: Secondary | ICD-10-CM | POA: Diagnosis present

## 2018-09-07 DIAGNOSIS — J440 Chronic obstructive pulmonary disease with acute lower respiratory infection: Secondary | ICD-10-CM | POA: Diagnosis not present

## 2018-09-07 DIAGNOSIS — J189 Pneumonia, unspecified organism: Secondary | ICD-10-CM | POA: Diagnosis not present

## 2018-09-07 DIAGNOSIS — I252 Old myocardial infarction: Secondary | ICD-10-CM | POA: Diagnosis not present

## 2018-09-07 DIAGNOSIS — Z515 Encounter for palliative care: Secondary | ICD-10-CM | POA: Diagnosis not present

## 2018-09-07 DIAGNOSIS — J439 Emphysema, unspecified: Secondary | ICD-10-CM | POA: Diagnosis present

## 2018-09-07 DIAGNOSIS — N183 Chronic kidney disease, stage 3 unspecified: Secondary | ICD-10-CM

## 2018-09-07 DIAGNOSIS — D696 Thrombocytopenia, unspecified: Secondary | ICD-10-CM | POA: Diagnosis present

## 2018-09-07 DIAGNOSIS — Y95 Nosocomial condition: Secondary | ICD-10-CM | POA: Diagnosis present

## 2018-09-07 DIAGNOSIS — I10 Essential (primary) hypertension: Secondary | ICD-10-CM | POA: Diagnosis present

## 2018-09-07 DIAGNOSIS — T451X5A Adverse effect of antineoplastic and immunosuppressive drugs, initial encounter: Secondary | ICD-10-CM | POA: Diagnosis present

## 2018-09-07 DIAGNOSIS — D630 Anemia in neoplastic disease: Secondary | ICD-10-CM | POA: Diagnosis present

## 2018-09-07 DIAGNOSIS — C7952 Secondary malignant neoplasm of bone marrow: Principal | ICD-10-CM | POA: Diagnosis present

## 2018-09-07 DIAGNOSIS — Z888 Allergy status to other drugs, medicaments and biological substances status: Secondary | ICD-10-CM

## 2018-09-07 DIAGNOSIS — Z8701 Personal history of pneumonia (recurrent): Secondary | ICD-10-CM

## 2018-09-07 DIAGNOSIS — C3432 Malignant neoplasm of lower lobe, left bronchus or lung: Secondary | ICD-10-CM | POA: Diagnosis present

## 2018-09-07 DIAGNOSIS — E785 Hyperlipidemia, unspecified: Secondary | ICD-10-CM | POA: Diagnosis present

## 2018-09-07 DIAGNOSIS — I2510 Atherosclerotic heart disease of native coronary artery without angina pectoris: Secondary | ICD-10-CM | POA: Diagnosis present

## 2018-09-07 DIAGNOSIS — Z8249 Family history of ischemic heart disease and other diseases of the circulatory system: Secondary | ICD-10-CM

## 2018-09-07 DIAGNOSIS — J9621 Acute and chronic respiratory failure with hypoxia: Secondary | ICD-10-CM

## 2018-09-07 DIAGNOSIS — C801 Malignant (primary) neoplasm, unspecified: Secondary | ICD-10-CM | POA: Diagnosis not present

## 2018-09-07 DIAGNOSIS — M2550 Pain in unspecified joint: Secondary | ICD-10-CM | POA: Diagnosis not present

## 2018-09-07 DIAGNOSIS — C3490 Malignant neoplasm of unspecified part of unspecified bronchus or lung: Secondary | ICD-10-CM | POA: Diagnosis not present

## 2018-09-07 DIAGNOSIS — R4182 Altered mental status, unspecified: Secondary | ICD-10-CM | POA: Diagnosis not present

## 2018-09-07 DIAGNOSIS — D6959 Other secondary thrombocytopenia: Secondary | ICD-10-CM | POA: Diagnosis present

## 2018-09-07 DIAGNOSIS — I25810 Atherosclerosis of coronary artery bypass graft(s) without angina pectoris: Secondary | ICD-10-CM | POA: Diagnosis present

## 2018-09-07 DIAGNOSIS — I110 Hypertensive heart disease with heart failure: Secondary | ICD-10-CM | POA: Diagnosis not present

## 2018-09-07 DIAGNOSIS — R0602 Shortness of breath: Secondary | ICD-10-CM | POA: Diagnosis not present

## 2018-09-07 DIAGNOSIS — G4733 Obstructive sleep apnea (adult) (pediatric): Secondary | ICD-10-CM | POA: Diagnosis present

## 2018-09-07 DIAGNOSIS — E876 Hypokalemia: Secondary | ICD-10-CM

## 2018-09-07 DIAGNOSIS — Z823 Family history of stroke: Secondary | ICD-10-CM

## 2018-09-07 DIAGNOSIS — I452 Bifascicular block: Secondary | ICD-10-CM | POA: Diagnosis present

## 2018-09-07 DIAGNOSIS — D649 Anemia, unspecified: Secondary | ICD-10-CM | POA: Diagnosis not present

## 2018-09-07 DIAGNOSIS — Z20828 Contact with and (suspected) exposure to other viral communicable diseases: Secondary | ICD-10-CM | POA: Diagnosis not present

## 2018-09-07 DIAGNOSIS — Z66 Do not resuscitate: Secondary | ICD-10-CM | POA: Diagnosis present

## 2018-09-07 DIAGNOSIS — E209 Hypoparathyroidism, unspecified: Secondary | ICD-10-CM | POA: Diagnosis present

## 2018-09-07 DIAGNOSIS — I5022 Chronic systolic (congestive) heart failure: Secondary | ICD-10-CM | POA: Diagnosis not present

## 2018-09-07 DIAGNOSIS — D6481 Anemia due to antineoplastic chemotherapy: Secondary | ICD-10-CM | POA: Diagnosis present

## 2018-09-07 DIAGNOSIS — Z8349 Family history of other endocrine, nutritional and metabolic diseases: Secondary | ICD-10-CM

## 2018-09-07 DIAGNOSIS — I25700 Atherosclerosis of coronary artery bypass graft(s), unspecified, with unstable angina pectoris: Secondary | ICD-10-CM | POA: Diagnosis not present

## 2018-09-07 DIAGNOSIS — J9601 Acute respiratory failure with hypoxia: Secondary | ICD-10-CM

## 2018-09-07 DIAGNOSIS — G4700 Insomnia, unspecified: Secondary | ICD-10-CM | POA: Diagnosis not present

## 2018-09-07 DIAGNOSIS — I5023 Acute on chronic systolic (congestive) heart failure: Secondary | ICD-10-CM | POA: Diagnosis not present

## 2018-09-07 DIAGNOSIS — R0902 Hypoxemia: Secondary | ICD-10-CM

## 2018-09-07 DIAGNOSIS — Z951 Presence of aortocoronary bypass graft: Secondary | ICD-10-CM

## 2018-09-07 DIAGNOSIS — F17290 Nicotine dependence, other tobacco product, uncomplicated: Secondary | ICD-10-CM | POA: Diagnosis present

## 2018-09-07 DIAGNOSIS — F17210 Nicotine dependence, cigarettes, uncomplicated: Secondary | ICD-10-CM | POA: Diagnosis present

## 2018-09-07 DIAGNOSIS — M79674 Pain in right toe(s): Secondary | ICD-10-CM | POA: Diagnosis not present

## 2018-09-07 DIAGNOSIS — N401 Enlarged prostate with lower urinary tract symptoms: Secondary | ICD-10-CM | POA: Diagnosis not present

## 2018-09-07 DIAGNOSIS — R410 Disorientation, unspecified: Secondary | ICD-10-CM | POA: Diagnosis not present

## 2018-09-07 DIAGNOSIS — R531 Weakness: Secondary | ICD-10-CM | POA: Diagnosis not present

## 2018-09-07 DIAGNOSIS — N400 Enlarged prostate without lower urinary tract symptoms: Secondary | ICD-10-CM | POA: Diagnosis present

## 2018-09-07 DIAGNOSIS — M79675 Pain in left toe(s): Secondary | ICD-10-CM | POA: Diagnosis not present

## 2018-09-07 DIAGNOSIS — Z79899 Other long term (current) drug therapy: Secondary | ICD-10-CM

## 2018-09-07 DIAGNOSIS — Z7401 Bed confinement status: Secondary | ICD-10-CM | POA: Diagnosis not present

## 2018-09-07 LAB — SARS CORONAVIRUS 2 BY RT PCR (HOSPITAL ORDER, PERFORMED IN ~~LOC~~ HOSPITAL LAB): SARS Coronavirus 2: NEGATIVE

## 2018-09-07 LAB — COMPREHENSIVE METABOLIC PANEL
ALT: 50 U/L — ABNORMAL HIGH (ref 0–44)
AST: 112 U/L — ABNORMAL HIGH (ref 15–41)
Albumin: 3.6 g/dL (ref 3.5–5.0)
Alkaline Phosphatase: 140 U/L — ABNORMAL HIGH (ref 38–126)
Anion gap: 12 (ref 5–15)
BUN: 22 mg/dL (ref 8–23)
CO2: 30 mmol/L (ref 22–32)
Calcium: 10.8 mg/dL — ABNORMAL HIGH (ref 8.9–10.3)
Chloride: 101 mmol/L (ref 98–111)
Creatinine, Ser: 1.54 mg/dL — ABNORMAL HIGH (ref 0.61–1.24)
GFR calc Af Amer: 48 mL/min — ABNORMAL LOW (ref >60)
GFR calc non Af Amer: 41 mL/min — ABNORMAL LOW (ref >60)
Glucose, Bld: 132 mg/dL — ABNORMAL HIGH (ref 70–99)
Potassium: 3.2 mmol/L — ABNORMAL LOW (ref 3.5–5.1)
Sodium: 143 mmol/L (ref 135–145)
Total Bilirubin: 1.2 mg/dL (ref 0.3–1.2)
Total Protein: 6.5 g/dL (ref 6.5–8.1)

## 2018-09-07 LAB — CBC WITH DIFFERENTIAL/PLATELET
Abs Immature Granulocytes: 0.01 10*3/uL (ref 0.00–0.07)
Basophils Absolute: 0 10*3/uL (ref 0.0–0.1)
Basophils Relative: 0 %
Eosinophils Absolute: 0.1 10*3/uL (ref 0.0–0.5)
Eosinophils Relative: 1 %
HCT: 42 % (ref 39.0–52.0)
Hemoglobin: 13.3 g/dL (ref 13.0–17.0)
Immature Granulocytes: 0 %
Lymphocytes Relative: 23 %
Lymphs Abs: 1.3 10*3/uL (ref 0.7–4.0)
MCH: 29.4 pg (ref 26.0–34.0)
MCHC: 31.7 g/dL (ref 30.0–36.0)
MCV: 92.7 fL (ref 80.0–100.0)
Monocytes Absolute: 0.6 10*3/uL (ref 0.1–1.0)
Monocytes Relative: 11 %
Neutro Abs: 3.6 10*3/uL (ref 1.7–7.7)
Neutrophils Relative %: 65 %
Platelets: 5 10*3/uL — CL (ref 150–400)
RBC: 4.53 MIL/uL (ref 4.22–5.81)
RDW: 13.2 % (ref 11.5–15.5)
WBC: 5.6 10*3/uL (ref 4.0–10.5)
nRBC: 0 % (ref 0.0–0.2)

## 2018-09-07 LAB — FIBRINOGEN: Fibrinogen: 334 mg/dL (ref 210–475)

## 2018-09-07 LAB — SAVE SMEAR(SSMR), FOR PROVIDER SLIDE REVIEW

## 2018-09-07 LAB — D-DIMER, QUANTITATIVE: D-Dimer, Quant: 16.83 ug/mL-FEU — ABNORMAL HIGH (ref 0.00–0.50)

## 2018-09-07 LAB — PROTIME-INR
INR: 1 (ref 0.8–1.2)
Prothrombin Time: 13.4 seconds (ref 11.4–15.2)

## 2018-09-07 LAB — BRAIN NATRIURETIC PEPTIDE: B Natriuretic Peptide: 68.3 pg/mL (ref 0.0–100.0)

## 2018-09-07 LAB — APTT: aPTT: 33 seconds (ref 24–36)

## 2018-09-07 MED ORDER — ACETAMINOPHEN 650 MG RE SUPP: 650.0000 mg | Freq: Four times a day (QID) | RECTAL | Status: DC | PRN

## 2018-09-07 MED ORDER — DIPHENHYDRAMINE HCL 50 MG PO CAPS
50.0000 mg | ORAL_CAPSULE | Freq: Every day | ORAL | Status: DC
  Administered 2018-09-07: 50 mg via ORAL
  Filled 2018-09-07: qty 1

## 2018-09-07 MED ORDER — ALBUTEROL SULFATE HFA 108 (90 BASE) MCG/ACT IN AERS
4.0000 | INHALATION_SPRAY | Freq: Once | RESPIRATORY_TRACT | Status: AC
  Administered 2018-09-07: 4 via RESPIRATORY_TRACT
  Filled 2018-09-07: qty 6.7

## 2018-09-07 MED ORDER — SODIUM CHLORIDE 0.9% IV SOLUTION
Freq: Once | INTRAVENOUS | Status: AC
  Administered 2018-09-08: 10 mL via INTRAVENOUS

## 2018-09-07 MED ORDER — PREDNISONE 50 MG PO TABS
60.0000 mg | ORAL_TABLET | Freq: Every day | ORAL | Status: DC
  Administered 2018-09-08 – 2018-09-11 (×4): 60 mg via ORAL
  Filled 2018-09-07 (×4): qty 1

## 2018-09-07 MED ORDER — SODIUM CHLORIDE (PF) 0.9 % IJ SOLN
INTRAMUSCULAR | Status: AC
  Filled 2018-09-07: qty 50

## 2018-09-07 MED ORDER — UMECLIDINIUM BROMIDE 62.5 MCG/INH IN AEPB
1.0000 | INHALATION_SPRAY | Freq: Every day | RESPIRATORY_TRACT | Status: DC
  Administered 2018-09-08 – 2018-09-13 (×6): 1 via RESPIRATORY_TRACT
  Filled 2018-09-07: qty 7

## 2018-09-07 MED ORDER — ONDANSETRON HCL 4 MG PO TABS: 4.0000 mg | ORAL_TABLET | Freq: Four times a day (QID) | ORAL | Status: DC | PRN

## 2018-09-07 MED ORDER — IOHEXOL 350 MG/ML SOLN
100.0000 mL | Freq: Once | INTRAVENOUS | Status: AC | PRN
  Administered 2018-09-07: 75 mL via INTRAVENOUS

## 2018-09-07 MED ORDER — FLUTICASONE FUROATE-VILANTEROL 100-25 MCG/INH IN AEPB
1.0000 | INHALATION_SPRAY | Freq: Every day | RESPIRATORY_TRACT | Status: DC
  Administered 2018-09-08 – 2018-09-13 (×6): 1 via RESPIRATORY_TRACT
  Filled 2018-09-07: qty 28

## 2018-09-07 MED ORDER — SODIUM CHLORIDE 0.9 % IV SOLN
2.0000 g | Freq: Two times a day (BID) | INTRAVENOUS | Status: DC
  Administered 2018-09-08 – 2018-09-14 (×13): 2 g via INTRAVENOUS
  Filled 2018-09-07 (×13): qty 2

## 2018-09-07 MED ORDER — POTASSIUM CHLORIDE CRYS ER 20 MEQ PO TBCR
40.0000 meq | EXTENDED_RELEASE_TABLET | Freq: Once | ORAL | Status: AC
  Administered 2018-09-07: 13:00:00 40 meq via ORAL
  Filled 2018-09-07: qty 2

## 2018-09-07 MED ORDER — ONDANSETRON HCL 4 MG/2ML IJ SOLN: 4.0000 mg | Freq: Four times a day (QID) | INTRAMUSCULAR | Status: DC | PRN

## 2018-09-07 MED ORDER — ROSUVASTATIN CALCIUM 10 MG PO TABS
40.0000 mg | ORAL_TABLET | Freq: Every day | ORAL | Status: DC
  Administered 2018-09-08 – 2018-09-12 (×5): 40 mg via ORAL
  Filled 2018-09-07 (×6): qty 4

## 2018-09-07 MED ORDER — VANCOMYCIN HCL IN DEXTROSE 750-5 MG/150ML-% IV SOLN
750.0000 mg | INTRAVENOUS | Status: DC
  Administered 2018-09-08: 750 mg via INTRAVENOUS
  Filled 2018-09-07 (×2): qty 150

## 2018-09-07 MED ORDER — ACETAMINOPHEN 500 MG PO TABS
1000.0000 mg | ORAL_TABLET | Freq: Every day | ORAL | Status: DC
  Administered 2018-09-08: 1000 mg via ORAL
  Filled 2018-09-07: qty 2

## 2018-09-07 MED ORDER — FLUTICASONE-UMECLIDIN-VILANT 100-62.5-25 MCG/INH IN AEPB: 1.0000 | INHALATION_SPRAY | Freq: Every day | RESPIRATORY_TRACT | Status: DC

## 2018-09-07 MED ORDER — FINASTERIDE 5 MG PO TABS
5.0000 mg | ORAL_TABLET | Freq: Every evening | ORAL | Status: DC
  Administered 2018-09-07 – 2018-09-11 (×5): 5 mg via ORAL
  Filled 2018-09-07 (×5): qty 1

## 2018-09-07 MED ORDER — LORATADINE 10 MG PO TABS
10.0000 mg | ORAL_TABLET | Freq: Every day | ORAL | Status: DC
  Administered 2018-09-08 – 2018-09-12 (×5): 10 mg via ORAL
  Filled 2018-09-07 (×5): qty 1

## 2018-09-07 MED ORDER — CARVEDILOL 12.5 MG PO TABS: 12.5000 mg | ORAL_TABLET | Freq: Two times a day (BID) | ORAL | Status: DC

## 2018-09-07 MED ORDER — SODIUM CHLORIDE 0.9 % IV SOLN
2.0000 g | Freq: Once | INTRAVENOUS | Status: AC
  Administered 2018-09-07: 16:00:00 2 g via INTRAVENOUS
  Filled 2018-09-07: qty 2

## 2018-09-07 MED ORDER — LOSARTAN POTASSIUM 50 MG PO TABS: 25.0000 mg | ORAL_TABLET | Freq: Every day | ORAL | Status: DC

## 2018-09-07 MED ORDER — FUROSEMIDE 40 MG PO TABS: 40.0000 mg | ORAL_TABLET | Freq: Every day | ORAL | Status: DC

## 2018-09-07 MED ORDER — ACETAMINOPHEN 325 MG PO TABS
650.0000 mg | ORAL_TABLET | Freq: Four times a day (QID) | ORAL | Status: DC | PRN
  Administered 2018-09-09 – 2018-09-10 (×2): 650 mg via ORAL
  Filled 2018-09-07 (×2): qty 2

## 2018-09-07 MED ORDER — VANCOMYCIN HCL 10 G IV SOLR
2000.0000 mg | Freq: Once | INTRAVENOUS | Status: AC
  Administered 2018-09-07: 2000 mg via INTRAVENOUS
  Filled 2018-09-07: qty 2000

## 2018-09-07 MED ORDER — DIPHENHYDRAMINE-APAP (SLEEP) 25-500 MG PO TABS: 2.0000 | ORAL_TABLET | Freq: Every day | ORAL | Status: DC

## 2018-09-07 NOTE — Progress Notes (Addendum)
HEMATOLOGY-ONCOLOGY PROGRESS NOTE  SUBJECTIVE: Mr. Joseph Joseph is currently followed at the cancer center for extensive stage small cell lung cancer.  He has been receiving treatment with atezolizumab.  Last dose was given on 08/27/2018.  His wife called our office today reporting that he was more short of breath and O2 sats were in the 80s.  They were advised to bring him into the emergency room for evaluation.  Work-up so far includes a CBC with a normal white blood cell count and hemoglobin and a platelet count of 5000.  His potassium was 3.2, creatinine 1.54, calcium 10.8, AST 112,.  ALT 50, alkaline phosphatase 140.  D-dimer elevated at 16.3 with normal fibrinogen, PT/INR, and PTT.  COVID-19 testing was negative.  Blood cultures have been drawn.  CT angiogram of the chest was performed which showed no evidence of acute pulmonary embolus, but there is a new central branching nodular density within the superior segment of the left lower lobe, adjacent nodules identified with posterior superior segment of left upper lobe measuring 2.7 cm which is also new from the previous exam.  This area is thought to represent an area of mucoid impaction possible pneumonia, however, differentials include primary bronchogenic carcinoma with endobronchial spread tumor.  There is also a large left hilar lymph node which is also new.  In the setting of pneumonia this could represent reactive adenopathy.  He also has morphologic features of the liver suggestive of early cirrhosis.  Patient states that he was short of breath at home.  O2 sats were in the 80s.  Denies cough, but his wife states that he has been having productive cough with green sputum production.  Denies fevers or chills.  Denies chest discomfort.  He denies epistaxis, hemoptysis, hematemesis, hematuria melena, hematochezia.  Oncology History  Small cell carcinoma (Joseph Joseph)  03/09/2018 Initial Diagnosis   Small cell carcinoma (Joseph Joseph)   03/09/2018 - 06/24/2018  Chemotherapy   The patient had palonosetron (ALOXI) injection 0.25 mg, 0.25 mg, Intravenous,  Once, 4 of 5 cycles Administration: 0.25 mg (04/03/2018), 0.25 mg (04/30/2018), 0.25 mg (05/28/2018) pegfilgrastim-cbqv (UDENYCA) injection 6 mg, 6 mg, Subcutaneous, Once, 4 of 5 cycles Administration: 6 mg (04/07/2018), 6 mg (05/03/2018), 6 mg (06/02/2018) CARBOplatin (PARAPLATIN) 250 mg in sodium chloride 0.9 % 100 mL chemo infusion, 250 mg (100 % of original dose 246 mg), Intravenous,  Once, 4 of 5 cycles Dose modification:   (original dose 246 mg, Cycle 1) Administration: 250 mg (03/09/2018), 410 mg (04/03/2018), 410 mg (04/30/2018), 330 mg (05/28/2018) etoposide (VEPESID) 90 mg in sodium chloride 0.9 % 250 mL chemo infusion, 40 mg/m2 = 90 mg (100 % of original dose 40 mg/m2), Intravenous,  Once, 4 of 5 cycles Dose modification: 50 mg/m2 (original dose 40 mg/m2, Cycle 1, Reason: Provider Judgment), 40 mg/m2 (original dose 40 mg/m2, Cycle 1, Reason: Provider Judgment), 80 mg/m2 (original dose 40 mg/m2, Cycle 2, Reason: Provider Judgment) Administration: 90 mg (03/09/2018), 90 mg (03/10/2018), 170 mg (04/03/2018), 170 mg (04/04/2018), 170 mg (04/05/2018), 170 mg (04/30/2018), 170 mg (05/01/2018), 170 mg (05/02/2018), 170 mg (05/28/2018), 170 mg (05/29/2018), 170 mg (05/30/2018)  for chemotherapy treatment.    Small cell lung cancer (Joseph Joseph)  04/20/2018 Initial Diagnosis   Small cell lung cancer (Joseph Joseph)   04/30/2018 -  Chemotherapy   The patient had atezolizumab (TECENTRIQ) 1,200 mg in sodium chloride 0.9 % 250 mL chemo infusion, 1,200 mg, Intravenous, Once, 6 of 8 cycles Administration: 1,200 mg (04/30/2018), 1,200 mg (05/28/2018), 1,200 mg (06/25/2018), 1,200  mg (07/16/2018), 1,200 mg (08/27/2018), 1,200 mg (08/06/2018)  for chemotherapy treatment.       REVIEW OF SYSTEMS:   Constitutional: Denies fevers, chills Respiratory: Reports shortness of breath and cough Cardiovascular: Denies palpitation, chest  discomfort Gastrointestinal:  Denies nausea, heartburn or change in bowel habits Skin: Denies abnormal skin rashes Lymphatics: Denies new lymphadenopathy or easy bruising Neurological:Denies numbness, tingling or new weaknesses Behavioral/Psych: Mood is stable, no new changes  Extremities: Reports lower extremity edema which improves when he elevates his legs All other systems were reviewed with the patient and are negative.  I have reviewed the past medical history, past surgical history, social history and family history with the patient and they are unchanged from previous note.   PHYSICAL EXAMINATION:  Vitals:   09/07/18 1330 09/07/18 1400  BP: 132/67 123/64  Pulse: 71 70  Resp: (!) 24 (!) 24  Temp:    SpO2: 96% 96%   There were no vitals filed for this visit.  Intake/Output from previous day: No intake/output data recorded.  GENERAL:alert, no distress and comfortable SKIN: Few ecchymotic areas on arms.  Petechiae to his bilateral lower extremities EYES: normal, Conjunctiva are pink and non-injected, sclera clear OROPHARYNX:no exudate, no erythema and lips, buccal mucosa, and tongue normal  NECK: supple, thyroid normal size, non-tender, without nodularity LYMPH:  no palpable lymphadenopathy in the cervical, axillary or inguinal LUNGS: Scattered wheezes HEART: regular rate & rhythm and no murmurs and extremity edema ABDOMEN:abdomen soft, non-tender and normal bowel sounds Musculoskeletal:no cyanosis of digits and no clubbing  NEURO: alert & oriented x 3 with fluent speech, no focal motor/sensory deficits  LABORATORY DATA:  I have reviewed the data as listed CMP Latest Ref Rng & Units 09/07/2018 08/24/2018 08/06/2018  Glucose 70 - 99 mg/dL 132(H) 114(H) 123(H)  BUN 8 - 23 mg/dL _0 Creatinine 0.61 - 1.24 mg/dL 1.54(H) 1.30(H) 1.25(H)  Sodium 135 - 145 mmol/L 143 143 143  Potassium 3.5 - 5.1 mmol/L 3.2(L) 3.6 3.9  Chloride 98 - 111 mmol/L 101 105 104  CO2 22 - 32  mmol/L _1 Calcium 8.9 - 10.3 mg/dL 10.8(H) 10.2 10.1  Total Protein 6.5 - 8.1 g/dL 6.5 - 6.6  Total Bilirubin 0.3 - 1.2 mg/dL 1.2 - 0.5  Alkaline Phos 38 - 126 U/L 140(H) - 57  AST 15 - 41 U/L 112(H) - 22  ALT 0 - 44 U/L 50(H) - 19    Lab Results  Component Value Date   WBC 5.6 09/07/2018   HGB 13.3 09/07/2018   HCT 42.0 09/07/2018   MCV 92.7 09/07/2018   PLT 5 (LL) 09/07/2018   NEUTROABS 3.6 09/07/2018   Blood smear: The white cell morphology is unremarkable, few ovalocytes and teardrop forms.  No nucleated red cells.  Rare platelet present.  No platelet clumps. Dg Chest 2 View  Result Date: 09/07/2018 CLINICAL DATA:  Shortness of breath and weakness EXAM: CHEST - 2 VIEW COMPARISON:  06/20/2018 FINDINGS: Cardiac shadow is stable. Postsurgical changes are again seen and stable. No focal infiltrate or sizable effusion is seen. No acute bony abnormality is noted. PICC line has been removed in the interval. IMPRESSION: No acute abnormality seen. Electronically Signed   By: Inez Catalina M.D.   On: 09/07/2018 10:29   Ct Angio Chest Pe W And/or Wo Contrast  Result Date: 09/07/2018 CLINICAL DATA:  Intermediate probability for acute pulmonary embolus. Shortness of breath EXAM: CT ANGIOGRAPHY CHEST WITH CONTRAST TECHNIQUE: Multidetector CT  imaging of the chest was performed using the standard protocol during bolus administration of intravenous contrast. Multiplanar CT image reconstructions and MIPs were obtained to evaluate the vascular anatomy. CONTRAST:  73m OMNIPAQUE IOHEXOL 350 MG/ML SOLN COMPARISON:  06/20/2018 FINDINGS: Cardiovascular: Normal heart size. Previous median sternotomy and CABG procedure. Aortic atherosclerosis. Main pulmonary artery is patent. No lobar or segmental pulmonary artery filling defects identified. Mediastinum/Nodes: Normal appearance of the thyroid gland. Small amount of aspirated debris is identified along the left lateral wall of the trachea. Normal appearance  of the esophagus. No mediastinal adenopathy. New left hilar lymph node measures 1.4 cm, image 49/4. Lungs/Pleura: No pleural effusion. Moderate changes of emphysema. Central branching nodular density within the superior segment of left upper lobe is new when compared with the previous exam measuring 2.5 by 2.7 cm. Subpleural area of consolidation with within the posterior aspect of the superior segment of left lower lobe measures 2.5 x 1.7 cm, new from previous exam. Upper Abdomen: No acute abnormality. The contour the liver is irregular and there is hypertrophy of the lateral segment of left lobe of liver. Early cirrhosis cannot be excluded. Musculoskeletal: No chest wall abnormality. No acute or significant osseous findings. Review of the MIP images confirms the above findings. IMPRESSION: 1. No findings of acute pulmonary embolus. 2. There is a new central branching nodular density within the superior segment of left lower lobe. Adjacent nodule is identified within the posterior superior segment of left upper lobe measuring 2.7 cm. Also new from previous exam. In the acute setting, findings are favored to represent an area of mucoid impaction and possible pneumonia. Differential considerations include primary bronchogenic carcinoma with endobronchial spread of tumor. Short-term interval follow-up imaging is recommended to ensure resolution and to rule out bronchogenic carcinoma in this patient who is at increased risk. 3. Enlarged left hilar lymph node. Also new. In the setting of pneumonia this may represent reactive adenopathy. Attention on short-term follow-up examination is recommended. 4. Debris is identified along the left lateral wall of the trachea. 5. Aortic Atherosclerosis (ICD10-I70.0) and Emphysema (ICD10-J43.9). 6. Morphologic features of the liver suggestive of early cirrhosis. Electronically Signed   By: TKerby MoorsM.D.   On: 09/07/2018 14:54    ASSESSMENT AND PLAN: Extensive stage small  cell lung cancer  CT chest 03/05/2018-left hilar mass, small mediastinal lymph nodes, atelectasis versus consolidation versus mass in the superior segment of the left lower lobe  Bone marrow biopsy 03/07/2018-extensive involvement of the bone marrow with metastatic small cell carcinoma consistent with a lung primary, small foci of non-small cell differentiation  Cycle 1 etoposide/carboplatin 03/09/2018, G-CSF started 03/12/2018 and discontinued 03/20/2018  CT head 03/21/2018-no evidence of metastatic disease  Cycle 2 etoposide/carboplatin, dose escalation of etoposide and carboplatin 04/03/2018  Cycle 3 etoposide/carboplatin with addition of atezolizumab 04/30/2018  Cycle 4 etoposide/carboplatin/atezolizumab 05/28/2018  Restaging chest CT 06/20/2018- left hilar nodal mass no longer visualized/measurable.  Subpleural mass within the superior segment left lower lobe has decreased in size.  No new or progressive findings.  Every 3-week atezolizumab beginning 06/25/2018 2. Thrombocytopenia secondary to #1 at presentation 3.COPD 4.History of coronary artery disease 5.CHF 6.BPH 7.Admission with pneumonia December 2019 8.Mild renal insufficiency 9.Elevated liver enzymes 10. History of coagulopathy 11. History of mild elevation of the calcium level 12. Anemia/leukopenia secondary to small cell lung cancer and chemotherapy-improved 13. Hypokalemia-likely secondary to furosemide therapy, potassium supplement started 04/03/2018 14.Admission 04/19/2018 with left lung pneumonia 15.  Admission 09/07/2018 with dyspnea and thrombocytopenia  Mr. Joseph Joseph presents with progressive dyspnea and hypoxia.  He was noted to have thrombocytopenia with a platelet count of 5000 on admission.  He is not actively bleeding but has petechiae.   Thrombocytopenia could be related to ITP versus reduction of his small cell lung cancer.  The patient presented in January 2020 with thrombocytopenia platelet count  on 8000. CT scan does not show evidence of PE or questionable pneumonia versus disease progression.  CT does not show convincing evidence of disease progression.  1.  Transfuse platelets for platelet count less than 20,000 or active bleeding. 2.  Daily CBC 3.  We will start the patient on prednisone 60 mg daily for possible ITP. 4.  We will perform bone marrow biopsy on Monday 09/10/2018 by interventional radiology. 5.  Antibiotics per hospitalist for possible pneumonia.    LOS: 0 days   Joseph Bussing, DNP, AGPCNP-BC, AOCNP 09/07/18 Mr. Joseph Joseph was interviewed and examined.  He presents with dyspnea.  The dyspnea may be related to a COPD flare.  I reviewed the CT images.  I suspect he has recurrent tumor in the left chest.  He has severe thrombocytopenia.  The thrombocytopenia is most likely related to progression of small cell carcinoma the bone marrow.  The differential diagnosis includes ITP, potentially induced by atezolizumab. Mr. Joseph Joseph will be admitted for further evaluation.  We will schedule a bone marrow biopsy for 09/10/2018.  We will decide on comfort care versus a trial of salvage systemic therapy if he is found to have recurrent disease in the bone marrow. Oncology will follow him daily.

## 2018-09-07 NOTE — ED Notes (Signed)
[place], MD at bedside.  

## 2018-09-07 NOTE — ED Notes (Signed)
Spoke to Lab.   Platelet count is 5. Tripled check by lab.    Regenia Skeeter MD made aware.

## 2018-09-07 NOTE — ED Provider Notes (Signed)
[facility] DEPT Provider Note   CSN: [id number] Arrival date & time: 09/07/18  8756    History   Chief Complaint Chief Complaint  Patient presents with   Shortness of Breath    HPI Joseph Berg is a 85 y.o. male.     HPI 85 year old male presents with shortness of breath.  The patient states that he is been short of breath for about a month when he walks.  The wife tells me that for about a week he has been more short of breath and when she checks his O2 sats they are in the high 80s.  This is abnormal for him.  Some cough and sputum.  No fevers.  Patient has had some chronic ankle swelling but nothing new or worse.  No chest pain.  Has also heard wheezing. He does not use oxygen normally.   Past Medical History:  Diagnosis Date   BPH (benign prostatic hyperplasia)    CAD (coronary artery disease)    a. s/p CABG x1 with LIMA-LAD 1994. b. LM & 3v CAD by cath 07/2012   CHF (congestive heart failure) ([place])    a. EF 35-40% by echo 07/2012.   Complication of anesthesia    affected patients memory. Pt stated "I couldn't remember anything for three months...just bits and pieces"   Emphysema    a. Moderate emphysema by CT 06/2012.   Heart attack ([place]) 1994   Hyperlipidemia    Hypertension    Hypoparathyroidism ([place])    NSVT (nonsustained ventricular tachycardia) ([place])    a. Seen on tele 07/2012.   OSA on CPAP    pt stated he does not wear CPAP because he no longer has sleep apnea   Pneumonia    Pseudoaneurysm of left femoral artery ([place])    a. post cath s/p compression 07/2012, associated w/ anemia.   Sciatica 2012   Qualifier: Diagnosis of  By: Madilyn Fireman MD, [place]      Patient Active Problem List   Diagnosis Date Noted   Port-A-Cath in place 04/27/2018   Small cell lung cancer ([place]) 04/20/2018   Goals of care, counseling/discussion 04/20/2018   Small cell carcinoma ([place]) 03/09/2018   Thrombocytopenia ([place])  03/05/2018   Acute on chronic combined systolic and diastolic CHF (congestive heart failure) ([place]) 01/12/2018   Primary osteoarthritis of right shoulder 01/27/2017   Trochanteric bursitis of both hips 12/28/2016   Demand ischemia ([place])    AKI (acute kidney injury) ([place]) 03/17/2016   Acute respiratory failure with hypoxia ([place]) 03/17/2016   Hyperglycemia 03/17/2016   Postlaminectomy syndrome, lumbar region 12/14/2015   COPD exacerbation ([place]) 03/27/2015   HNP (herniated nucleus pulposus), lumbar 02/18/2015   Claudication of right lower extremity ([place]) 12/09/2014   Lumbar degenerative disc disease 05/13/2014   Mixed restrictive and obstructive lung disease ([place]) 01/16/2014   Cerebrovascular disease 06/26/2013   AAA (abdominal aortic aneurysm) without rupture ([place]) 06/24/2013   Aortic calcification ([place]) 06/10/2013   S/P CABG x 2 08/17/2012   Cardiomyopathy, ischemic 43/32/9518   Chronic systolic congestive heart failure ([place]) 07/11/2012   CAD (coronary artery disease) of artery bypass graft 07/06/2012   OSA (obstructive sleep apnea) 01/24/2012   BPH (benign prostatic hyperplasia) 03/02/2011   History of tobacco abuse 08/23/2010   Hypoparathyroidism ([place]) 07/30/2010   SCIATICA 11/10/2009   INSULIN RESISTANCE SYNDROME 04/16/2007   HYPERLIPIDEMIA NEC/NOS 11/14/2006   HYPERTENSION, BENIGN ESSENTIAL 11/14/2006    Past Surgical History:  Procedure Laterality Date  CATARACT EXTRACTION, BILATERAL     COLONOSCOPY     CORONARY ARTERY BYPASS GRAFT  03-23-92   CORONARY ARTERY BYPASS GRAFT N/A 08/14/2012   Procedure: REDO CORONARY ARTERY BYPASS GRAFTING (CABG);  Surgeon: Gaye Pollack, MD;  Location: [facility];  Service: Open Heart Surgery;  Laterality: N/A;   INTRAOPERATIVE TRANSESOPHAGEAL ECHOCARDIOGRAM N/A 08/14/2012   Procedure: INTRAOPERATIVE TRANSESOPHAGEAL ECHOCARDIOGRAM;  Surgeon: Gaye Pollack, MD;  Location: [facility];  Service: Open Heart Surgery;   Laterality: N/A;   LUMBAR LAMINECTOMY/DECOMPRESSION MICRODISCECTOMY Left 02/18/2015   Procedure: LUMBAR LAMINECTOMY/DECOMPRESSION MICRODISCECTOMY- LEFT FOUR-FIVE ;  Surgeon: Ashok Pall, MD;  Location: [facility];  Service: Neurosurgery;  Laterality: Left;        Home Medications    Prior to Admission medications   Medication Sig Start Date End Date Taking? Authorizing Provider  AMBULATORY NON FORMULARY MEDICATION Nebulizer - use as needed - with supplies.  Diagnosis of COPD J44.1 09/19/17  Yes [name], MD  Aspirin Buf,CaCarb-MgCarb-MgO, 81 MG TABS Take 81 mg by mouth daily.   Yes [provider]  calcium-vitamin D (OSCAL 500/200 D-3) 500-200 MG-UNIT tablet Take 1 tablet by mouth 2 (two) times daily. 03/21/18 03/21/19 Yes Georgette Shell, MD  carvedilol (COREG) 12.5 MG tablet Take 1 tablet (12.5 mg total) by mouth 2 (two) times daily with a meal. 08/15/18  Yes Crenshaw, Denice Bors, MD  cholecalciferol (VITAMIN D3) 25 MCG (1000 UT) tablet Take 1,000 Units by mouth daily.   Yes [provider]  cilostazol (PLETAL) 100 MG tablet Take 100 mg by mouth daily. 07/10/18  Yes [provider]  Cyanocobalamin (VITAMIN B-12 PO) Take 1 tablet by mouth daily.   Yes [provider]  diphenhydramine-acetaminophen (TYLENOL PM) 25-500 MG TABS tablet Take 2 tablets by mouth at bedtime.   Yes [provider]  fexofenadine (ALLEGRA) 180 MG tablet Take 180 mg by mouth daily as needed for allergies or rhinitis.   Yes [provider]  finasteride (PROSCAR) 5 MG tablet Take 1 tablet (5 mg total) by mouth every evening. 04/16/18  Yes [name], MD  Fluticasone-Umeclidin-Vilant (TRELEGY ELLIPTA) 100-62.5-25 MCG/INH AEPB Inhale 1 Inhaler into the lungs daily. 05/28/18  Yes [name], MD  furosemide (LASIX) 40 MG tablet Take 1 tablet (40 mg total) by mouth daily. 08/15/18  Yes Lelon Perla, MD  loperamide (IMODIUM A-D) 2 MG tablet  Take 2 mg by mouth 4 (four) times daily as needed for diarrhea or loose stools.   Yes [provider]  losartan (COZAAR) 25 MG tablet Take 1 tablet (25 mg total) by mouth daily. 08/15/18 11/13/18 Yes Lelon Perla, MD  Multiple Vitamin (MULTIVITAMIN) capsule Take 1 capsule by mouth daily.   Yes [provider]  Probiotic Product (TRUBIOTICS PO) Take 1 tablet by mouth daily.   Yes [provider]  rosuvastatin (CRESTOR) 40 MG tablet Take 1 tablet (40 mg total) by mouth daily. 08/15/18  Yes Crenshaw, Denice Bors, MD  VENTOLIN HFA 108 (90 Base) MCG/ACT inhaler TAKE 2 PUFFS BY MOUTH EVERY 6 HOURS AS NEEDED FOR WHEEZE OR SHORTNESS OF BREATH Patient taking differently: Inhale 2 puffs into the lungs every 6 (six) hours as needed for wheezing or shortness of breath.  12/28/17  Yes [name], MD  vitamin C (ASCORBIC ACID) 250 MG tablet Take 750 mg by mouth daily.   Yes [provider]    Family History Family History  Problem Relation Age of  Onset   Heart attack Father 21   Stroke Brother 49   Hypertension Brother    Hyperlipidemia Brother     Social History Social History   Tobacco Use   Smoking status: Current Some Day Smoker    Packs/day: 1.00    Years: 60.00    Pack years: 60.00    Types: Cigarettes, Pipe   Smokeless tobacco: Never Used   Tobacco comment: 01/11/2018 "smokes cigars only now"  Substance Use Topics   Alcohol use: Yes    Comment: Occasional   Drug use: No     Allergies   Hydrochlorothiazide   Review of Systems Review of Systems  Constitutional: Negative for fever.  Respiratory: Positive for cough, shortness of breath and wheezing.   Cardiovascular: Positive for leg swelling (ankles). Negative for chest pain.  Gastrointestinal: Negative for abdominal pain and vomiting.  All other systems reviewed and are negative.    Physical Exam Updated Vital Signs BP 123/64    Pulse 70    Temp 98 F (36.7 C) (Oral)     Resp (!) 24    SpO2 96%   Physical Exam Vitals signs and nursing note reviewed.  Constitutional:      General: He is not in acute distress.    Appearance: He is well-developed. He is not ill-appearing or diaphoretic.  HENT:     Head: Normocephalic and atraumatic.     Right Ear: External ear normal.     Left Ear: External ear normal.     Nose: Nose normal.  Eyes:     General:        Right eye: No discharge.        Left eye: No discharge.  Neck:     Musculoskeletal: Neck supple.  Cardiovascular:     Rate and Rhythm: Normal rate and regular rhythm.     Heart sounds: Normal heart sounds.  Pulmonary:     Effort: Pulmonary effort is normal. No tachypnea or accessory muscle usage.     Breath sounds: Wheezing (diffuse, expiratory) present.  Abdominal:     Palpations: Abdomen is soft.     Tenderness: There is no abdominal tenderness.  Musculoskeletal:     Right lower leg: Edema present.     Left lower leg: Edema present.     Comments: Trace ankle swelling, non pitting, bilateral  Skin:    General: Skin is warm and dry.  Neurological:     Mental Status: He is alert.  Psychiatric:        Mood and Affect: Mood is not anxious.      ED Treatments / Results  Labs (all labs ordered are listed, but only abnormal results are displayed) Labs Reviewed  COMPREHENSIVE METABOLIC PANEL - Abnormal; Notable for the following components:      Result Value   Potassium 3.2 (*)    Glucose, Bld 132 (*)    Creatinine, Ser 1.54 (*)    Calcium 10.8 (*)    AST 112 (*)    ALT 50 (*)    Alkaline Phosphatase 140 (*)    GFR calc non Af Amer 41 (*)    GFR calc Af Amer 48 (*)    All other components within normal limits  CBC WITH DIFFERENTIAL/PLATELET - Abnormal; Notable for the following components:   Platelets 5 (*)    All other components within normal limits  D-DIMER, QUANTITATIVE (NOT AT [facility]) - Abnormal; Notable for the following components:   D-Dimer, Quant 16.83 (*)  All other  components within normal limits  SARS CORONAVIRUS 2 (HOSPITAL ORDER, National LAB)  CULTURE, BLOOD (ROUTINE X 2)  CULTURE, BLOOD (ROUTINE X 2)  BRAIN NATRIURETIC PEPTIDE  SAVE SMEAR (SSMR)  FIBRINOGEN  PROTIME-INR  APTT    EKG EKG Interpretation  Date/Time:  Friday September 07 2018 10:12:11 EDT Ventricular Rate:  82 PR Interval:    QRS Duration: 176 QT Interval:  441 QTC Calculation: 516 R Axis:   102 Text Interpretation:  Sinus rhythm RBBB and LPFB similar to Mar 2020 Confirmed by Sherwood Gambler [phone]) on 09/07/2018 11:13:06 AM   Radiology Dg Chest 2 View  Result Date: 09/07/2018 CLINICAL DATA:  Shortness of breath and weakness EXAM: CHEST - 2 VIEW COMPARISON:  06/20/2018 FINDINGS: Cardiac shadow is stable. Postsurgical changes are again seen and stable. No focal infiltrate or sizable effusion is seen. No acute bony abnormality is noted. PICC line has been removed in the interval. IMPRESSION: No acute abnormality seen. Electronically Signed   By: Inez Catalina M.D.   On: 09/07/2018 10:29   Ct Angio Chest Pe W And/or Wo Contrast  Result Date: 09/07/2018 CLINICAL DATA:  Intermediate probability for acute pulmonary embolus. Shortness of breath EXAM: CT ANGIOGRAPHY CHEST WITH CONTRAST TECHNIQUE: Multidetector CT imaging of the chest was performed using the standard protocol during bolus administration of intravenous contrast. Multiplanar CT image reconstructions and MIPs were obtained to evaluate the vascular anatomy. CONTRAST:  3mL OMNIPAQUE IOHEXOL 350 MG/ML SOLN COMPARISON:  06/20/2018 FINDINGS: Cardiovascular: Normal heart size. Previous median sternotomy and CABG procedure. Aortic atherosclerosis. Main pulmonary artery is patent. No lobar or segmental pulmonary artery filling defects identified. Mediastinum/Nodes: Normal appearance of the thyroid gland. Small amount of aspirated debris is identified along the left lateral wall of the trachea. Normal appearance  of the esophagus. No mediastinal adenopathy. New left hilar lymph node measures 1.4 cm, image 49/4. Lungs/Pleura: No pleural effusion. Moderate changes of emphysema. Central branching nodular density within the superior segment of left upper lobe is new when compared with the previous exam measuring 2.5 by 2.7 cm. Subpleural area of consolidation with within the posterior aspect of the superior segment of left lower lobe measures 2.5 x 1.7 cm, new from previous exam. Upper Abdomen: No acute abnormality. The contour the liver is irregular and there is hypertrophy of the lateral segment of left lobe of liver. Early cirrhosis cannot be excluded. Musculoskeletal: No chest wall abnormality. No acute or significant osseous findings. Review of the MIP images confirms the above findings. IMPRESSION: 1. No findings of acute pulmonary embolus. 2. There is a new central branching nodular density within the superior segment of left lower lobe. Adjacent nodule is identified within the posterior superior segment of left upper lobe measuring 2.7 cm. Also new from previous exam. In the acute setting, findings are favored to represent an area of mucoid impaction and possible pneumonia. Differential considerations include primary bronchogenic carcinoma with endobronchial spread of tumor. Short-term interval follow-up imaging is recommended to ensure resolution and to rule out bronchogenic carcinoma in this patient who is at increased risk. 3. Enlarged left hilar lymph node. Also new. In the setting of pneumonia this may represent reactive adenopathy. Attention on short-term follow-up examination is recommended. 4. Debris is identified along the left lateral wall of the trachea. 5. Aortic Atherosclerosis (ICD10-I70.0) and Emphysema (ICD10-J43.9). 6. Morphologic features of the liver suggestive of early cirrhosis. Electronically Signed   By: Kerby Moors M.D.   On: 09/07/2018  14:54    Procedures .Critical Care Performed by:  Sherwood Gambler, MD Authorized by: Sherwood Gambler, MD   Critical care provider statement:    Critical care time (minutes):  30   Critical care was necessary to treat or prevent imminent or life-threatening deterioration of the following conditions:  Respiratory failure   Critical care was time spent personally by me on the following activities:  Discussions with consultants, evaluation of patient's response to treatment, examination of patient, ordering and performing treatments and interventions, ordering and review of laboratory studies, ordering and review of radiographic studies, pulse oximetry, re-evaluation of patient's condition, obtaining history from patient or surrogate and review of old charts   (including critical care time)  Medications Ordered in ED Medications  sodium chloride (PF) 0.9 % injection (has no administration in time range)  ceFEPIme (MAXIPIME) 2 g in sodium chloride 0.9 % 100 mL IVPB (has no administration in time range)  vancomycin (VANCOCIN) 2,000 mg in sodium chloride 0.9 % 500 mL IVPB (has no administration in time range)  albuterol (VENTOLIN HFA) 108 (90 Base) MCG/ACT inhaler 4 puff (4 puffs Inhalation Given 09/07/18 1136)  potassium chloride SA (K-DUR) CR tablet 40 mEq (40 mEq Oral Given 09/07/18 1307)  iohexol (OMNIPAQUE) 350 MG/ML injection 100 mL (75 mLs Intravenous Contrast Given 09/07/18 1407)     Initial Impression / Assessment and Plan / ED Course  I have reviewed the triage vital signs and the nursing notes.  Pertinent labs & imaging results that were available during my care of the patient were reviewed by me and considered in my medical decision making (see chart for details).        Work-up shows no COVID-19 infection.  However his lab work is positive for significant thrombocytopenia.  He scratched his ear which bled and has a scab but otherwise no bleeding.  No obvious petechiae on exam.  I discussed with Dr. Benay Spice, likely his ITP and he  has asked for a couple extra lab such as PT, PTT and fibrinogen.  Given the d-dimer, CT obtained and is negative for PE but does show concern for may be a pneumonia.  Given his frequent healthcare use and immunosuppression I will cover him for healthcare associated pneumonia.  He is requiring 2 L oxygen as his sats dropped all the way down to 85%.  No respiratory distress.  Dr. Maylene Roes to admit.  Joseph Berg was evaluated in Emergency Department on 09/07/2018 for the symptoms described in the history of present illness. He was evaluated in the context of the global COVID-19 pandemic, which necessitated consideration that the patient might be at risk for infection with the SARS-CoV-2 virus that causes COVID-19. Institutional protocols and algorithms that pertain to the evaluation of patients at risk for COVID-19 are in a state of rapid change based on information released by regulatory bodies including the CDC and federal and state organizations. These policies and algorithms were followed during the patient's care in the ED.   Final Clinical Impressions(s) / ED Diagnoses   Final diagnoses:  Acute on chronic respiratory failure with hypoxia ([place])  Thrombocytopenia [facility])    ED Discharge Orders    None       Sherwood Gambler, MD 09/07/18 1525

## 2018-09-07 NOTE — ED Notes (Addendum)
Patients bed assignment moved to 1505. Report given.

## 2018-09-07 NOTE — Progress Notes (Signed)
Attempted to call to get report but unable to get in touch with ED RN. Placed on hold for 10 minutes. Will attempt to call again.

## 2018-09-07 NOTE — H&P (Signed)
History and Physical    Joseph Berg:[id number] DOB: [birth year] DOA: 09/07/2018  PCP: [name], MD  Patient coming from: Home  Chief Complaint: Shortness of breath   HPI: Joseph Berg is a 85 y.o. male with medical history significant of CAD, chronic systolic heart failure, small cell lung cancer followed by Dr. Benay Spice, COPD, BPH who presents to the hospital with chief complaint of shortness of breath.  His wife is at bedside who is able to provide history.  She states that he started to complain of shortness of breath Wednesday evening.  Per their home pulse ox monitor, his oxygen saturation was in the 80s.  She contacted the cancer center today who recommended the patient be evaluated in the emergency department.  He also admits to a productive yellow sputum 2 to 3 days prior to shortness of breath.  Denies any fevers, chest pain, abdominal pain, nausea, vomiting.  Does admit to some mild peripheral edema.  He denies any blood loss, no epistaxis, blood in sputum, blood in stool.  ED Course: In the emergency department, patient was found to be hypoxic with pulse ox in the 80s on room air and improved to mid 90s with 2 L nasal cannula O2.  Labs show a creatinine 1.54, potassium 3.2, BNP 68, platelets 5.  SARS-CoV-2 negative.  CTA chest negative for PE but did reveal left lower lobe nodular density concerning for mucoid impaction versus pneumonia versus tumor.  Also showed enlarged left hilar lymph node.  Oncology consulted for thrombocytopenia.  Review of Systems: As per HPI. Otherwise, all other review of systems reviewed and are negative.   Past Medical History:  Diagnosis Date  . BPH (benign prostatic hyperplasia)   . CAD (coronary artery disease)    a. s/p CABG x1 with LIMA-LAD 1994. b. LM & 3v CAD by cath 07/2012  . CHF (congestive heart failure) ([place])    a. EF 35-40% by echo 07/2012.  [name] Complication of anesthesia    affected patients memory. Pt stated "I  couldn't remember anything for three months...just bits and pieces"  . Emphysema    a. Moderate emphysema by CT 06/2012.  [name] Heart attack ([place]) 1994  . Hyperlipidemia   . Hypertension   . Hypoparathyroidism ([place])   . NSVT (nonsustained ventricular tachycardia) ([place])    a. Seen on tele 07/2012.  . OSA on CPAP    pt stated he does not wear CPAP because he no longer has sleep apnea  . Pneumonia   . Pseudoaneurysm of left femoral artery ([place])    a. post cath s/p compression 07/2012, associated w/ anemia.  . Sciatica 2012   Qualifier: Diagnosis of  By: Madilyn Fireman MD, Barn[place]      Past Surgical History:  Procedure Laterality Date  . CATARACT EXTRACTION, BILATERAL    . COLONOSCOPY    . CORONARY ARTERY BYPASS GRAFT  03-23-92  . CORONARY ARTERY BYPASS GRAFT N/A 08/14/2012   Procedure: REDO CORONARY ARTERY BYPASS GRAFTING (CABG);  Surgeon: Gaye Pollack, MD;  Location: [facility];  Service: Open Heart Surgery;  Laterality: N/A;  . INTRAOPERATIVE TRANSESOPHAGEAL ECHOCARDIOGRAM N/A 08/14/2012   Procedure: INTRAOPERATIVE TRANSESOPHAGEAL ECHOCARDIOGRAM;  Surgeon: Gaye Pollack, MD;  Location: [facility];  Service: Open Heart Surgery;  Laterality: N/A;  . LUMBAR LAMINECTOMY/DECOMPRESSION MICRODISCECTOMY Left 02/18/2015   Procedure: LUMBAR LAMINECTOMY/DECOMPRESSION MICRODISCECTOMY- LEFT FOUR-FIVE ;  Surgeon: Ashok Pall, MD;  Location: [facility];  Service: Neurosurgery;  Laterality: Left;  reports that he has been smoking cigarettes and pipe. He has a 60.00 pack-year smoking history. He has never used smokeless tobacco. He reports current alcohol use. He reports that he does not use drugs.  Allergies  Allergen Reactions  . Hydrochlorothiazide Other (See Comments)    Affected renal function.  "affected kidneys and calcium level"    Family History  Problem Relation Age of Onset  . Heart attack Father 50  . Stroke Brother 3  . Hypertension Brother   . Hyperlipidemia Brother      Prior to  Admission medications   Medication Sig Start Date End Date Taking? Authorizing Provider  AMBULATORY NON FORMULARY MEDICATION Nebulizer - use as needed - with supplies.  Diagnosis of COPD J44.1 09/19/17  Yes [name], MD  Aspirin Buf,CaCarb-MgCarb-MgO, 81 MG TABS Take 81 mg by mouth daily.   Yes [provider]  calcium-vitamin D (OSCAL 500/200 D-3) 500-200 MG-UNIT tablet Take 1 tablet by mouth 2 (two) times daily. 03/21/18 03/21/19 Yes Georgette Shell, MD  carvedilol (COREG) 12.5 MG tablet Take 1 tablet (12.5 mg total) by mouth 2 (two) times daily with a meal. 08/15/18  Yes Crenshaw, Denice Bors, MD  cholecalciferol (VITAMIN D3) 25 MCG (1000 UT) tablet Take 1,000 Units by mouth daily.   Yes [provider]  cilostazol (PLETAL) 100 MG tablet Take 100 mg by mouth daily. 07/10/18  Yes [provider]  Cyanocobalamin (VITAMIN B-12 PO) Take 1 tablet by mouth daily.   Yes [provider]  diphenhydramine-acetaminophen (TYLENOL PM) 25-500 MG TABS tablet Take 2 tablets by mouth at bedtime.   Yes [provider]  fexofenadine (ALLEGRA) 180 MG tablet Take 180 mg by mouth daily as needed for allergies or rhinitis.   Yes [provider]  finasteride (PROSCAR) 5 MG tablet Take 1 tablet (5 mg total) by mouth every evening. 04/16/18  Yes [name], MD  Fluticasone-Umeclidin-Vilant (TRELEGY ELLIPTA) 100-62.5-25 MCG/INH AEPB Inhale 1 Inhaler into the lungs daily. 05/28/18  Yes [name], MD  furosemide (LASIX) 40 MG tablet Take 1 tablet (40 mg total) by mouth daily. 08/15/18  Yes Lelon Perla, MD  loperamide (IMODIUM A-D) 2 MG tablet Take 2 mg by mouth 4 (four) times daily as needed for diarrhea or loose stools.   Yes [provider]  losartan (COZAAR) 25 MG tablet Take 1 tablet (25 mg total) by mouth daily. 08/15/18 11/13/18 Yes Lelon Perla, MD  Multiple Vitamin (MULTIVITAMIN) capsule Take 1 capsule by mouth daily.   Yes  [provider]  Probiotic Product (TRUBIOTICS PO) Take 1 tablet by mouth daily.   Yes [provider]  rosuvastatin (CRESTOR) 40 MG tablet Take 1 tablet (40 mg total) by mouth daily. 08/15/18  Yes Crenshaw, Denice Bors, MD  VENTOLIN HFA 108 (90 Base) MCG/ACT inhaler TAKE 2 PUFFS BY MOUTH EVERY 6 HOURS AS NEEDED FOR WHEEZE OR SHORTNESS OF BREATH Patient taking differently: Inhale 2 puffs into the lungs every 6 (six) hours as needed for wheezing or shortness of breath.  12/28/17  Yes [name], MD  vitamin C (ASCORBIC ACID) 250 MG tablet Take 750 mg by mouth daily.   Yes [provider]    Physical Exam: Vitals:   09/07/18 1310 09/07/18 1315 09/07/18 1330 09/07/18 1400  BP: 107/90  132/67 123/64  Pulse: 72 73 71 70  Resp: (!) 22 (!) 28 (!) 24 (!) 24  Temp:      TempSrc:  SpO2: 95% 96% 96% 96%     Constitutional: NAD, calm, comfortable Eyes: PERRL, lids and conjunctivae normal ENMT: Deferred as patient wearing a mask Respiratory: Clear to auscultation bilaterally anteriorly, no wheezes, normal respiratory effort, no conversational dyspnea, on nasal cannula O2 Cardiovascular: Regular rate and rhythm, no murmurs / rubs / gallops.  Trace peripheral edema bilaterally Abdomen: no tenderness, no masses palpated. No hepatosplenomegaly. Bowel sounds positive.  Musculoskeletal: no clubbing / cyanosis. No joint deformity upper and lower extremities.Normal muscle tone.  Skin: no rashes, lesions, ulcers on exposed skin Neurologic: CN 2-12 grossly intact.  Speech clear, no focal deficits  Psychiatric: Normal judgment and insight. Alert and oriented x 3. Normal mood.   Labs on Admission: I have personally reviewed following labs and imaging studies  CBC: Recent Labs  Lab 09/07/18 1139  WBC 5.6  NEUTROABS 3.6  HGB 13.3  HCT 42.0  MCV 92.7  PLT 5*   Basic Metabolic Panel: Recent Labs  Lab 09/07/18 1139  NA 143  K 3.2*  CL 101  CO2 30  GLUCOSE  132*  BUN 22  CREATININE 1.54*  CALCIUM 10.8*   GFR: Estimated Creatinine Clearance: 41.5 mL/min (A) (by C-G formula based on SCr of 1.54 mg/dL (H)). Liver Function Tests: Recent Labs  Lab 09/07/18 1139  AST 112*  ALT 50*  ALKPHOS 140*  BILITOT 1.2  PROT 6.5  ALBUMIN 3.6   No results for input(s): LIPASE, AMYLASE in the last 168 hours. No results for input(s): AMMONIA in the last 168 hours. Coagulation Profile: Recent Labs  Lab 09/07/18 1139  INR 1.0   Cardiac Enzymes: No results for input(s): CKTOTAL, CKMB, CKMBINDEX, TROPONINI in the last 168 hours. BNP (last 3 results) No results for input(s): PROBNP in the last 8760 hours. HbA1C: No results for input(s): HGBA1C in the last 72 hours. CBG: No results for input(s): GLUCAP in the last 168 hours. Lipid Profile: No results for input(s): CHOL, HDL, LDLCALC, TRIG, CHOLHDL, LDLDIRECT in the last 72 hours. Thyroid Function Tests: No results for input(s): TSH, T4TOTAL, FREET4, T3FREE, THYROIDAB in the last 72 hours. Anemia Panel: No results for input(s): VITAMINB12, FOLATE, FERRITIN, TIBC, IRON, RETICCTPCT in the last 72 hours. Urine analysis:    Component Value Date/Time   COLORURINE YELLOW 06/20/2018 1014   APPEARANCEUR CLEAR 06/20/2018 1014   LABSPEC 1.017 06/20/2018 1014   PHURINE < OR = 5.0 06/20/2018 1014   GLUCOSEU NEGATIVE 06/20/2018 [place] 06/20/2018 [place] 04/20/2018 0611   BILIRUBINUR neg 10/31/2011 1121   KETONESUR NEGATIVE 06/20/2018 1014   PROTEINUR NEGATIVE 06/20/2018 1014   UROBILINOGEN 2.0 (H) 08/09/2012 1316   NITRITE NEGATIVE 06/20/2018 1014   LEUKOCYTESUR TRACE (A) 06/20/2018 1014   Sepsis Labs: !!!!!!!!!!!!!!!!!!!!!!!!!!!!!!!!!!!!!!!!!!!! @LABRCNTIP (procalcitonin:4,lacticidven:4) ) Recent Results (from the past 240 hour(s))  SARS Coronavirus 2 [facility] order, Performed in [place] hospital lab) Nasopharyngeal Nasopharyngeal Swab     Status: None    Collection Time: 09/07/18  1:08 PM   Specimen: Nasopharyngeal Swab  Result Value Ref Range Status   SARS Coronavirus 2 NEGATIVE NEGATIVE Final    Comment: (NOTE) If result is NEGATIVE SARS-CoV-2 target nucleic acids are NOT DETECTED. The SARS-CoV-2 RNA is generally detectable in upper and lower  respiratory specimens during the acute phase of infection. The lowest  concentration of SARS-CoV-2 viral copies this assay can detect is 250  copies / mL. A negative result does not preclude SARS-CoV-2 infection  and should not be used  as the sole basis for treatment or other  patient management decisions.  A negative result may occur with  improper specimen collection / handling, submission of specimen other  than nasopharyngeal swab, presence of viral mutation(s) within the  areas targeted by this assay, and inadequate number of viral copies  (<250 copies / mL). A negative result must be combined with clinical  observations, patient history, and epidemiological information. If result is POSITIVE SARS-CoV-2 target nucleic acids are DETECTED. The SARS-CoV-2 RNA is generally detectable in upper and lower  respiratory specimens dur ing the acute phase of infection.  Positive  results are indicative of active infection with SARS-CoV-2.  Clinical  correlation with patient history and other diagnostic information is  necessary to determine patient infection status.  Positive results do  not rule out bacterial infection or co-infection with other viruses. If result is PRESUMPTIVE POSTIVE SARS-CoV-2 nucleic acids MAY BE PRESENT.   A presumptive positive result was obtained on the submitted specimen  and confirmed on repeat testing.  While 2019 novel coronavirus  (SARS-CoV-2) nucleic acids may be present in the submitted sample  additional confirmatory testing may be necessary for epidemiological  and / or clinical management purposes  to differentiate between  SARS-CoV-2 and other Sarbecovirus  currently known to infect humans.  If clinically indicated additional testing with an alternate test  methodology [phone]) is advised. The SARS-CoV-2 RNA is generally  detectable in upper and lower respiratory sp ecimens during the acute  phase of infection. The expected result is Negative. Fact Sheet for Patients:  StrictlyIdeas.no Fact Sheet for Healthcare Providers: BankingDealers.co.za This test is not yet approved or cleared by the [place] FDA and has been authorized for detection and/or diagnosis of SARS-CoV-2 by FDA under an Emergency Use Authorization (EUA).  This EUA will remain in effect (meaning this test can be used) for the duration of the COVID-19 declaration under Section 564(b)(1) of the Act, 21 U.S.C. section 360bbb-3(b)(1), unless the authorization is terminated or revoked sooner. Performed at [facility], [place] [address], [place], [place] [address]      Radiological Exams on Admission: Dg Chest 2 View  Result Date: 09/07/2018 CLINICAL DATA:  Shortness of breath and weakness EXAM: CHEST - 2 VIEW COMPARISON:  06/20/2018 FINDINGS: Cardiac shadow is stable. Postsurgical changes are again seen and stable. No focal infiltrate or sizable effusion is seen. No acute bony abnormality is noted. PICC line has been removed in the interval. IMPRESSION: No acute abnormality seen. Electronically Signed   By: Inez Catalina M.D.   On: 09/07/2018 10:29   Ct Angio Chest Pe W And/or Wo Contrast  Result Date: 09/07/2018 CLINICAL DATA:  Intermediate probability for acute pulmonary embolus. Shortness of breath EXAM: CT ANGIOGRAPHY CHEST WITH CONTRAST TECHNIQUE: Multidetector CT imaging of the chest was performed using the standard protocol during bolus administration of intravenous contrast. Multiplanar CT image reconstructions and MIPs were obtained to evaluate the vascular anatomy. CONTRAST:  41mL OMNIPAQUE IOHEXOL 350  MG/ML SOLN COMPARISON:  06/20/2018 FINDINGS: Cardiovascular: Normal heart size. Previous median sternotomy and CABG procedure. Aortic atherosclerosis. Main pulmonary artery is patent. No lobar or segmental pulmonary artery filling defects identified. Mediastinum/Nodes: Normal appearance of the thyroid gland. Small amount of aspirated debris is identified along the left lateral wall of the trachea. Normal appearance of the esophagus. No mediastinal adenopathy. New left hilar lymph node measures 1.4 cm, image 49/4. Lungs/Pleura: No pleural effusion. Moderate changes of emphysema. Central branching nodular density within the  superior segment of left upper lobe is new when compared with the previous exam measuring 2.5 by 2.7 cm. Subpleural area of consolidation with within the posterior aspect of the superior segment of left lower lobe measures 2.5 x 1.7 cm, new from previous exam. Upper Abdomen: No acute abnormality. The contour the liver is irregular and there is hypertrophy of the lateral segment of left lobe of liver. Early cirrhosis cannot be excluded. Musculoskeletal: No chest wall abnormality. No acute or significant osseous findings. Review of the MIP images confirms the above findings. IMPRESSION: 1. No findings of acute pulmonary embolus. 2. There is a new central branching nodular density within the superior segment of left lower lobe. Adjacent nodule is identified within the posterior superior segment of left upper lobe measuring 2.7 cm. Also new from previous exam. In the acute setting, findings are favored to represent an area of mucoid impaction and possible pneumonia. Differential considerations include primary bronchogenic carcinoma with endobronchial spread of tumor. Short-term interval follow-up imaging is recommended to ensure resolution and to rule out bronchogenic carcinoma in this patient who is at increased risk. 3. Enlarged left hilar lymph node. Also new. In the setting of pneumonia this may  represent reactive adenopathy. Attention on short-term follow-up examination is recommended. 4. Debris is identified along the left lateral wall of the trachea. 5. Aortic Atherosclerosis (ICD10-I70.0) and Emphysema (ICD10-J43.9). 6. Morphologic features of the liver suggestive of early cirrhosis. Electronically Signed   By: Kerby Moors M.D.   On: 09/07/2018 14:54    EKG: Independently reviewed.  Normal sinus rhythm with right axis deviation, right bundle branch block.  Similar in appearance to March 2020  Assessment/Plan Principal Problem:   Thrombocytopenia (HCC) Active Problems:   HYPERTENSION, BENIGN ESSENTIAL   BPH (benign prostatic hyperplasia)   CAD (coronary artery disease) of artery bypass graft   Chronic systolic congestive heart failure (HCC)   Acute hypoxemic respiratory failure (HCC)   Healthcare-associated pneumonia   Small cell lung cancer (HCC)   Hypokalemia   CKD (chronic kidney disease) stage 3, GFR 30-59 ml/min (HCC)   Thrombocytopenia -No signs of bleeding -Oncology consulted  Acute hypoxemic respiratory failure -Secondary to left lower lobe pneumonia  -SARS-CoV-2 negative -Nasal cannula O2 to maintain sats greater than 88%  Left lower lobe pneumonia, HCAP  -Sepsis ruled out, pneumonia POA  -Vanco, cefepime -Check MRSA PCR  Small cell lung cancer -Follows with Dr. Benay Spice  Hypokalemia -Replace, trend  CKD stage III -Baseline creatinine 1.3 -Stable, continue to trend BMP  Chronic systolic heart failure -Without acute exacerbation at this time.  BNP 68 -Continue home Lasix, Coreg  Essential hypertension -Continue Cozaar, Coreg  Hyperlipidemia -Continue Crestor    DVT prophylaxis: SCD in setting of thrombocytopenia Code Status: DNR, confirmed with patient and wife at the time of admission Family Communication: Wife at bedside Disposition Plan: Pending stabilization and improvement, suspect return back home on discharge Consults called:  Oncology Admission status: Inpatient  * I certify that at the point of admission it is my clinical judgment that the patient will require inpatient hospital care spanning beyond 2 midnights from the point of admission due to high intensity of service, high risk for further deterioration and high frequency of surveillance required.Dessa Phi, DO [facility] 09/07/2018, 3:16 PM    How to contact the [facility] Attending or Consulting provider [place] or covering provider during after hours [place], for this patient?  1. Check the care team in [facility]  and look for a) attending/consulting [place] provider listed and b) the [facility] team listed 2. Log into [email or web address] and use [place]'s universal password to access. If you do not have the password, please contact the hospital operator. 3. Locate the [facility] provider you are looking for under [facility] and page to a number that you can be directly reached. 4. If you still have difficulty reaching the provider, please page the [facility] (Director on Call) for the Hospitalists listed on amion for assistance.

## 2018-09-07 NOTE — ED Notes (Signed)
Transport has been called.

## 2018-09-07 NOTE — ED Notes (Signed)
Attempted to call report. Spoke to [place], Therapist, sports on 5E. States she will have to call me back.

## 2018-09-07 NOTE — ED Notes (Signed)
Assisted patient at bedside with urinal.

## 2018-09-07 NOTE — ED Notes (Signed)
Patient transported to CT 

## 2018-09-07 NOTE — Telephone Encounter (Signed)
"  Joseph Berg [phone]).  My husband, Joseph Berg ([phone]) is having trouble breathing.  Oxygen saturation has been low; 85% to 87% the last three days; lowest when he's seated.  Do we come in to Symptom Management or call his PCP?"    "Yesterday evening T= 97.7 and before bed 98.6.  I'm at work now.    Producing small amounts pink tinged mucous with cough.   Returned oxygen; had not needed or used oxygen; did not want it in the house."   Advised ED with low saturation levels.  "Not calling 911.  $300 charge every time.  I'll return home to get him ready."    Return call received from wife and patient.   "I feel fine.  No trouble getting enough air.  I have cough medicine but rarely use it.  I thought saturation in the 80's was good." Increased audible wheezing, labored breathing with conversation.  O2 Sat remains in 80's.   Advised to go to ED.   "Can I shower and shave?"    Again advised to go to ED now.

## 2018-09-07 NOTE — ED Triage Notes (Signed)
Pt reports that had SOB for past month esp with walking. Reports today his wife came home from work and told him he was going to hospital.

## 2018-09-07 NOTE — ED Notes (Signed)
Pulse on Ox on RA-83-89%. Switched pulse ox on 2 different fingers with same results.   Patient consistently at 85% RA.  Patient placed on 2 litters [place].   Regenia Skeeter, MD made aware.   Patient denies shob.

## 2018-09-07 NOTE — Progress Notes (Signed)
Pharmacy Antibiotic Note  Joseph Berg is a 85 y.o. male admitted on 09/07/2018 with pneumonia.  Pharmacy has been consulted for cefepime and vancomycin dosing.  Pt has PMH significant for CAD, HF, SCLC, COPD presenting with SOB.   Assessment: Today, 09/07/18  WBC 5.6  SCr 1.54, CrCl 41 mL/min  Afebrile  Plan:  Cefepime 2 g IV q12h  Vancomycin 2000 mg LD followed by 750 mg IV q24h  Goal AUC 400-550  Follow renal function and culture date  Check vancomycin levels once at steady state if indicated  Recommend checking MRSA PCR for ability to de-escalate antibiotics     Temp (24hrs), Avg:98 F (36.7 C), Min:98 F (36.7 C), Max:98 F (36.7 C)  Recent Labs  Lab 09/07/18 1139  WBC 5.6  CREATININE 1.54*    Estimated Creatinine Clearance: 41.5 mL/min (A) (by C-G formula based on SCr of 1.54 mg/dL (H)).    Allergies  Allergen Reactions  . Hydrochlorothiazide Other (See Comments)    Affected renal function.  "affected kidneys and calcium level"    Antimicrobials this admission: vancomycin 7/31 >>  cefepime 7/31 >>   Dose adjustments this admission:  Microbiology results: 7/31 BCx: Sent 7/31 COVID: Negative  Thank you for allowing pharmacy to be a part of this patient's care.  Lenis Noon, PharmD 09/07/2018 5:06 PM

## 2018-09-07 NOTE — ED Notes (Signed)
Attempted to give report to 5E. Spoke to Network engineer and transferred to [employer]. No answer. Will call back.

## 2018-09-08 DIAGNOSIS — D696 Thrombocytopenia, unspecified: Secondary | ICD-10-CM

## 2018-09-08 LAB — CBC WITH DIFFERENTIAL/PLATELET
Abs Immature Granulocytes: 0.04 10*3/uL (ref 0.00–0.07)
Abs Immature Granulocytes: 0.03 10*3/uL (ref 0.00–0.07)
Basophils Absolute: 0 10*3/uL (ref 0.0–0.1)
Basophils Absolute: 0 10*3/uL (ref 0.0–0.1)
Basophils Relative: 0 %
Basophils Relative: 0 %
Eosinophils Absolute: 0.1 10*3/uL (ref 0.0–0.5)
Eosinophils Absolute: 0 10*3/uL (ref 0.0–0.5)
Eosinophils Relative: 1 %
Eosinophils Relative: 0 %
HCT: 32.1 % — ABNORMAL LOW (ref 39.0–52.0)
HCT: 31.1 % — ABNORMAL LOW (ref 39.0–52.0)
Hemoglobin: 10.2 g/dL — ABNORMAL LOW (ref 13.0–17.0)
Hemoglobin: 9.8 g/dL — ABNORMAL LOW (ref 13.0–17.0)
Immature Granulocytes: 1 %
Immature Granulocytes: 0 %
Lymphocytes Relative: 23 %
Lymphocytes Relative: 17 %
Lymphs Abs: 1.6 10*3/uL (ref 0.7–4.0)
Lymphs Abs: 1.5 10*3/uL (ref 0.7–4.0)
MCH: 30 pg (ref 26.0–34.0)
MCH: 30 pg (ref 26.0–34.0)
MCHC: 31.8 g/dL (ref 30.0–36.0)
MCHC: 31.5 g/dL (ref 30.0–36.0)
MCV: 94.4 fL (ref 80.0–100.0)
MCV: 95.1 fL (ref 80.0–100.0)
Monocytes Absolute: 0.8 10*3/uL (ref 0.1–1.0)
Monocytes Absolute: 0.3 10*3/uL (ref 0.1–1.0)
Monocytes Relative: 12 %
Monocytes Relative: 4 %
Neutro Abs: 4.2 10*3/uL (ref 1.7–7.7)
Neutro Abs: 6.6 10*3/uL (ref 1.7–7.7)
Neutrophils Relative %: 63 %
Neutrophils Relative %: 79 %
Platelets: 11 10*3/uL — CL (ref 150–400)
Platelets: 39 10*3/uL — ABNORMAL LOW (ref 150–400)
RBC: 3.4 MIL/uL — ABNORMAL LOW (ref 4.22–5.81)
RBC: 3.27 MIL/uL — ABNORMAL LOW (ref 4.22–5.81)
RDW: 13.3 % (ref 11.5–15.5)
RDW: 13.5 % (ref 11.5–15.5)
WBC: 6.7 10*3/uL (ref 4.0–10.5)
WBC: 8.4 10*3/uL (ref 4.0–10.5)
nRBC: 0 % (ref 0.0–0.2)
nRBC: 0.2 % (ref 0.0–0.2)

## 2018-09-08 LAB — BASIC METABOLIC PANEL
Anion gap: 9 (ref 5–15)
BUN: 54 mg/dL — ABNORMAL HIGH (ref 8–23)
CO2: 28 mmol/L (ref 22–32)
Calcium: 10 mg/dL (ref 8.9–10.3)
Chloride: 106 mmol/L (ref 98–111)
Creatinine, Ser: 1.5 mg/dL — ABNORMAL HIGH (ref 0.61–1.24)
GFR calc Af Amer: 49 mL/min — ABNORMAL LOW (ref >60)
GFR calc non Af Amer: 42 mL/min — ABNORMAL LOW (ref >60)
Glucose, Bld: 116 mg/dL — ABNORMAL HIGH (ref 70–99)
Potassium: 3.7 mmol/L (ref 3.5–5.1)
Sodium: 143 mmol/L (ref 135–145)

## 2018-09-08 LAB — MRSA PCR SCREENING: MRSA by PCR: NEGATIVE

## 2018-09-08 MED ORDER — SODIUM CHLORIDE 0.9% IV SOLUTION
Freq: Once | INTRAVENOUS | Status: AC
  Administered 2018-09-08: 16:00:00 via INTRAVENOUS

## 2018-09-08 MED ORDER — ACETAMINOPHEN 500 MG PO TABS
1000.0000 mg | ORAL_TABLET | Freq: Once | ORAL | Status: AC
  Administered 2018-09-08: 1000 mg via ORAL
  Filled 2018-09-08: qty 2

## 2018-09-08 MED ORDER — DIPHENHYDRAMINE HCL 50 MG PO CAPS
50.0000 mg | ORAL_CAPSULE | Freq: Once | ORAL | Status: AC
  Administered 2018-09-08: 50 mg via ORAL
  Filled 2018-09-08: qty 1

## 2018-09-08 NOTE — Progress Notes (Signed)
CRITICAL VALUE ALERT  Critical Value:  Platelet 11  Date & Time Notied:  09/08/2018 1019  Provider Notified: Maylene Roes  Orders Received/Actions taken: MD at bedside.

## 2018-09-08 NOTE — Consult Note (Signed)
Chief Complaint: Joseph Berg was seen in consultation today for bone marrow biopsy.  Referring Physician(s): Mikey Bussing, NP  Supervising Physician: Arne Cleveland  Joseph Berg Status: [facility] - In-pt  History of Present Illness: Joseph Berg is a 85 y.o. male with a past medical history significant for BPH, CAD s/p CABGx1, CHF, MI, HTN, HLD, COPD and small cell lung cancer followed by Dr. Benay Spice who presented to [facility] ED on 7/31 with complaints of progressive dyspnea with productive cough. His wife reported that Joseph Berg home pulse ox monitor read that his oxygen saturation was in the 80s. In the ED Joseph Berg was found to be hypoxic with SpO2 in 80s, improved to the 90s with 2L O2 via [place]. Additionally his platelets were noted to be 5, Joseph Berg was admitted for further evaluation and management - oncology was consulted for thrombocytopenia. Request has been made to IR for bone marrow biopsy to further assess thrombocytopenia.  Joseph Berg laying in bed, appears comfortable. Denies any dyspnea at this time. Joseph Berg states Joseph Berg feels ok overall. Joseph Berg is aware of the requested procedure and wishes to proceed.   Past Medical History:  Diagnosis Date  . BPH (benign prostatic hyperplasia)   . CAD (coronary artery disease)    a. s/p CABG x1 with LIMA-LAD 1994. b. LM & 3v CAD by cath 07/2012  . CHF (congestive heart failure) ([place])    a. EF 35-40% by echo 07/2012.  [name] Complication of anesthesia    affected patients memory. Pt stated "I couldn't remember anything for three months...just bits and pieces"  . Emphysema    a. Moderate emphysema by CT 06/2012.  [name] Heart attack ([place]) 1994  . Hyperlipidemia   . Hypertension   . Hypoparathyroidism ([place])   . NSVT (nonsustained ventricular tachycardia) ([place])    a. Seen on tele 07/2012.  . OSA on CPAP    pt stated Joseph Berg does not wear CPAP because Joseph Berg no longer has sleep apnea  . Pneumonia   . Pseudoaneurysm of left femoral artery ([place])    a. post cath s/p compression 07/2012, associated  w/ anemia.  . Sciatica 2012   Qualifier: Diagnosis of  By: Madilyn Fireman MD, Barnetta Chapel      Past Surgical History:  Procedure Laterality Date  . CATARACT EXTRACTION, BILATERAL    . COLONOSCOPY    . CORONARY ARTERY BYPASS GRAFT  03-23-92  . CORONARY ARTERY BYPASS GRAFT N/A 08/14/2012   Procedure: REDO CORONARY ARTERY BYPASS GRAFTING (CABG);  Surgeon: Gaye Pollack, MD;  Location: [facility];  Service: Open Heart Surgery;  Laterality: N/A;  . INTRAOPERATIVE TRANSESOPHAGEAL ECHOCARDIOGRAM N/A 08/14/2012   Procedure: INTRAOPERATIVE TRANSESOPHAGEAL ECHOCARDIOGRAM;  Surgeon: Gaye Pollack, MD;  Location: [facility] OR;  Service: Open Heart Surgery;  Laterality: N/A;  . LUMBAR LAMINECTOMY/DECOMPRESSION MICRODISCECTOMY Left 02/18/2015   Procedure: LUMBAR LAMINECTOMY/DECOMPRESSION MICRODISCECTOMY- LEFT FOUR-FIVE ;  Surgeon: Ashok Pall, MD;  Location: [place] [facility]S;  Service: Neurosurgery;  Laterality: Left;    Allergies: Hydrochlorothiazide  Medications: Prior to Admission medications   Medication Sig Start Date End Date Taking? Authorizing Provider  AMBULATORY NON FORMULARY MEDICATION Nebulizer - use as needed - with supplies.  Diagnosis of COPD J44.1 09/19/17  Yes [name], MD  Aspirin Buf,CaCarb-MgCarb-MgO, 81 MG TABS Take 81 mg by mouth daily.   Yes [provider]  calcium-vitamin D (OSCAL 500/200 D-3) 500-200 MG-UNIT tablet Take 1 tablet by mouth 2 (two) times daily. 03/21/18 03/21/19 Yes Georgette Shell, MD  carvedilol (COREG) 12.5 MG tablet  Take 1 tablet (12.5 mg total) by mouth 2 (two) times daily with a meal. 08/15/18  Yes Crenshaw, Denice Bors, MD  cholecalciferol (VITAMIN D3) 25 MCG (1000 UT) tablet Take 1,000 Units by mouth daily.   Yes [provider]  cilostazol (PLETAL) 100 MG tablet Take 100 mg by mouth daily. 07/10/18  Yes [provider]  Cyanocobalamin (VITAMIN B-12 PO) Take 1 tablet by mouth daily.   Yes [provider]  diphenhydramine-acetaminophen  (TYLENOL PM) 25-500 MG TABS tablet Take 2 tablets by mouth at bedtime.   Yes [provider]  fexofenadine (ALLEGRA) 180 MG tablet Take 180 mg by mouth daily as needed for allergies or rhinitis.   Yes [provider]  finasteride (PROSCAR) 5 MG tablet Take 1 tablet (5 mg total) by mouth every evening. 04/16/18  Yes [name], MD  Fluticasone-Umeclidin-Vilant (TRELEGY ELLIPTA) 100-62.5-25 MCG/INH AEPB Inhale 1 Inhaler into the lungs daily. 05/28/18  Yes [name], MD  furosemide (LASIX) 40 MG tablet Take 1 tablet (40 mg total) by mouth daily. 08/15/18  Yes Lelon Perla, MD  loperamide (IMODIUM A-D) 2 MG tablet Take 2 mg by mouth 4 (four) times daily as needed for diarrhea or loose stools.   Yes [provider]  losartan (COZAAR) 25 MG tablet Take 1 tablet (25 mg total) by mouth daily. 08/15/18 11/13/18 Yes Lelon Perla, MD  Multiple Vitamin (MULTIVITAMIN) capsule Take 1 capsule by mouth daily.   Yes [provider]  Probiotic Product (TRUBIOTICS PO) Take 1 tablet by mouth daily.   Yes [provider]  rosuvastatin (CRESTOR) 40 MG tablet Take 1 tablet (40 mg total) by mouth daily. 08/15/18  Yes Crenshaw, Denice Bors, MD  VENTOLIN HFA 108 (90 Base) MCG/ACT inhaler TAKE 2 PUFFS BY MOUTH EVERY 6 HOURS AS NEEDED FOR WHEEZE OR SHORTNESS OF BREATH Joseph Berg taking differently: Inhale 2 puffs into the lungs every 6 (six) hours as needed for wheezing or shortness of breath.  12/28/17  Yes [name], MD  vitamin C (ASCORBIC ACID) 250 MG tablet Take 750 mg by mouth daily.   Yes [provider]     Family History  Problem Relation Age of Onset  . Heart attack Father 85  . Stroke Brother 77  . Hypertension Brother   . Hyperlipidemia Brother     Social History   Socioeconomic History  . Marital status: Married    Spouse name: Not on file  . Number of children: Not on file  . Years of education: Not on file  . Highest  education level: Not on file  Occupational History  . Not on file  Social Needs  . Financial resource strain: Not on file  . Food insecurity    Worry: Not on file    Inability: Not on file  . Transportation needs    Medical: Not on file    Non-medical: Not on file  Tobacco Use  . Smoking status: Current Some Day Smoker    Packs/day: 1.00    Years: 60.00    Pack years: 60.00    Types: Cigarettes, Pipe  . Smokeless tobacco: Never Used  . Tobacco comment: 01/11/2018 "smokes cigars only now"  Substance and Sexual Activity  . Alcohol use: Yes    Comment: Occasional  . Drug use: No  . Sexual activity: Not on file    Comment: works part-time, Conservation [employer], [employer] completed H, married, no children, regular exercise.S  Lifestyle  . Physical  activity    Days per week: Not on file    Minutes per session: Not on file  . Stress: Not on file  Relationships  . Social Herbalist on phone: Not on file    Gets together: Not on file    Attends religious service: Not on file    Active member of club or organization: Not on file    Attends meetings of clubs or organizations: Not on file    Relationship status: Not on file  Other Topics Concern  . Not on file  Social History Narrative  . Not on file     Review of Systems: A 12 point ROS discussed and pertinent positives are indicated in the HPI above.  All other systems are negative.  Review of Systems  Constitutional: Negative for chills and fever.  HENT: Negative for nosebleeds.   Respiratory: Positive for cough (intermittent) and shortness of breath (with exertion; none at rest currently).   Cardiovascular: Negative for chest pain.  Gastrointestinal: Negative for abdominal pain, blood in stool, diarrhea, nausea and vomiting.  Genitourinary: Negative for dysuria and hematuria.  Musculoskeletal: Negative for back pain.  Skin: Negative for rash.  Neurological: Negative for dizziness and syncope.  Hematological: Does not  bruise/bleed easily.    Vital Signs: BP 95/73 (BP Location: Right Arm)   Pulse 100   Temp 99.3 F (37.4 C) (Oral)   Resp 20   SpO2 91%   Physical Exam Vitals signs reviewed.  Constitutional:      General: Joseph Berg is not in acute distress. HENT:     Head: Normocephalic.     Mouth/Throat:     Mouth: Mucous membranes are moist.     Pharynx: Oropharynx is clear.  Cardiovascular:     Rate and Rhythm: Normal rate and regular rhythm.  Pulmonary:     Effort: Pulmonary effort is normal.     Breath sounds: Normal breath sounds.  Abdominal:     General: Bowel sounds are normal. There is no distension.     Palpations: Abdomen is soft.     Tenderness: There is no abdominal tenderness.  Skin:    General: Skin is warm and dry.  Neurological:     Mental Status: Joseph Berg is alert and oriented to person, place, and time.  Psychiatric:        Mood and Affect: Mood normal.        Behavior: Behavior normal.        Thought Content: Thought content normal.        Judgment: Judgment normal.      MD Evaluation Airway: WNL Heart: WNL Abdomen: WNL Chest/ Lungs: WNL ASA  Classification: 3 Mallampati/Airway Score: Two   Imaging: Dg Chest 2 View  Result Date: 09/07/2018 CLINICAL DATA:  Shortness of breath and weakness EXAM: CHEST - 2 VIEW COMPARISON:  06/20/2018 FINDINGS: Cardiac shadow is stable. Postsurgical changes are again seen and stable. No focal infiltrate or sizable effusion is seen. No acute bony abnormality is noted. PICC line has been removed in the interval. IMPRESSION: No acute abnormality seen. Electronically Signed   By: Inez Catalina M.D.   On: 09/07/2018 10:29   Ct Angio Chest Pe W And/or Wo Contrast  Result Date: 09/07/2018 CLINICAL DATA:  Intermediate probability for acute pulmonary embolus. Shortness of breath EXAM: CT ANGIOGRAPHY CHEST WITH CONTRAST TECHNIQUE: Multidetector CT imaging of the chest was performed using the standard protocol during bolus administration of  intravenous contrast. Multiplanar CT image  reconstructions and MIPs were obtained to evaluate the vascular anatomy. CONTRAST:  43m OMNIPAQUE IOHEXOL 350 MG/ML SOLN COMPARISON:  06/20/2018 FINDINGS: Cardiovascular: Normal heart size. Previous median sternotomy and CABG procedure. Aortic atherosclerosis. Main pulmonary artery is patent. No lobar or segmental pulmonary artery filling defects identified. Mediastinum/Nodes: Normal appearance of the thyroid gland. Small amount of aspirated debris is identified along the left lateral wall of the trachea. Normal appearance of the esophagus. No mediastinal adenopathy. New left hilar lymph node measures 1.4 cm, image 49/4. Lungs/Pleura: No pleural effusion. Moderate changes of emphysema. Central branching nodular density within the superior segment of left upper lobe is new when compared with the previous exam measuring 2.5 by 2.7 cm. Subpleural area of consolidation with within the posterior aspect of the superior segment of left lower lobe measures 2.5 x 1.7 cm, new from previous exam. Upper Abdomen: No acute abnormality. The contour the liver is irregular and there is hypertrophy of the lateral segment of left lobe of liver. Early cirrhosis cannot be excluded. Musculoskeletal: No chest wall abnormality. No acute or significant osseous findings. Review of the MIP images confirms the above findings. IMPRESSION: 1. No findings of acute pulmonary embolus. 2. There is a new central branching nodular density within the superior segment of left lower lobe. Adjacent nodule is identified within the posterior superior segment of left upper lobe measuring 2.7 cm. Also new from previous exam. In the acute setting, findings are favored to represent an area of mucoid impaction and possible pneumonia. Differential considerations include primary bronchogenic carcinoma with endobronchial spread of tumor. Short-term interval follow-up imaging is recommended to ensure resolution and to rule  out bronchogenic carcinoma in this Joseph Berg who is at increased risk. 3. Enlarged left hilar lymph node. Also new. In the setting of pneumonia this may represent reactive adenopathy. Attention on short-term follow-up examination is recommended. 4. Debris is identified along the left lateral wall of the trachea. 5. Aortic Atherosclerosis (ICD10-I70.0) and Emphysema (ICD10-J43.9). 6. Morphologic features of the liver suggestive of early cirrhosis. Electronically Signed   By: TKerby MoorsM.D.   On: 09/07/2018 14:54    Labs:  CBC: Recent Labs    07/16/18 0747 08/06/18 1023 09/07/18 1139 09/08/18 0639  WBC 6.7 7.3 5.6 6.7  HGB 12.2* 13.1 13.3 10.2*  HCT 37.8* 40.0 42.0 32.1*  PLT 140* 133* 5* 11*    COAGS: Recent Labs    03/05/18 2221 03/10/18 0815 03/12/18 0638 04/19/18 1637 09/07/18 1139  INR 1.25 1.33 1.35 1.3* 1.0  APTT 37* 37* 36  --  33    BMP: Recent Labs    07/16/18 0747 08/06/18 1023 08/24/18 1447 09/07/18 1139 09/08/18 0639  NA 142 143 143 143 143  K 3.9 3.9 3.6 3.2* 3.7  CL 104 104 105 101 106  CO2 25 29 31 30 28   GLUCOSE 123* 123* 114* 132* 116*  BUN 20 15 18 22  54*  CALCIUM 10.0 10.1 10.2 10.8* 10.0  CREATININE 1.22 1.25* 1.30* 1.54* 1.50*  GFRNONAA 55* 53*  --  41* 42*  GFRAA >60 >60  --  48* 49*    LIVER FUNCTION TESTS: Recent Labs    06/25/18 1000 07/16/18 0747 08/06/18 1023 09/07/18 1139  BILITOT 0.5 0.6 0.5 1.2  AST 20 22 22  112*  ALT 20 15 19  50*  ALKPHOS 49 53 57 140*  PROT 6.6 6.8 6.6 6.5  ALBUMIN 3.7 3.9 3.8 3.6    TUMOR MARKERS: No results for input(s): AFPTM, CEA, CA199,  [place] in the last 8760 hours.  Assessment and Plan:  85 y/o M with history of small cell lung cancer who presented [facility] ED on 7/31 with c/o dyspnea and hypoxia. During initial workup his platelets were noted to be 5, previously 133 about a month ago - oncology was consulted and request was made to IR for bone marrow biopsy to further evaluate  thrombocytopenia.   Will tentatively plan for bone marrow biopsy in IR on Monday (8/3) or Tuesday (8/4). I will place orders for Joseph Berg to be NPO after midnight on 8/3, AM labs. Please hold anticoagulation.  Risks and benefits discussed with the Joseph Berg including, but not limited to bleeding, infection, damage to adjacent structures or low yield requiring additional tests.  All of the Joseph Berg's questions were answered, Joseph Berg is agreeable to proceed.  Consent signed and in chart.  Thank you for this interesting consult.  I greatly enjoyed meeting Joseph Berg and look forward to participating in Joseph Berg care.  A copy of this report was sent to the requesting provider on this date.  Electronically Signed: Joaquim Nam, PA-C 09/08/2018, 10:39 AM   I spent a total of 40 Minutes  in face to face in clinical consultation, greater than 50% of which was counseling/coordinating care for bone marrow biopsy.

## 2018-09-08 NOTE — Progress Notes (Addendum)
Joseph Berg   DOB:[birth year]   BU#:[id number]   EHO#:[id number]  Oncology follow up   Subjective: I am covering Dr. Benay Spice to see pt during the weekend.  Patient is clinically stable, no signs of bleeding, shortness of breath slightly improved.  Afebrile.  I called his wife during the visit.   Objective:  Vitals:   09/08/18 0722 09/08/18 1018  BP:  95/73  Pulse:  100  Resp:  20  Temp:  99.3 F (37.4 C)  SpO2: 93% 91%    There is no height or weight on file to calculate BMI.  Intake/Output Summary (Last 24 hours) at 09/08/2018 1202 Last data filed at 09/08/2018 1100 Gross per 24 hour  Intake 926 ml  Output 202 ml  Net 724 ml     Sclerae unicteric  Oropharynx clear  No peripheral adenopathy  Lungs clear -- no rales or rhonchi  Heart regular rate and rhythm  Abdomen benign  Neuro nonfocal   CBG (last 3)  No results for input(s): GLUCAP in the last 72 hours.   Labs:  Lab Results  Component Value Date   WBC 6.7 09/08/2018   HGB 10.2 (L) 09/08/2018   HCT 32.1 (L) 09/08/2018   MCV 94.4 09/08/2018   PLT 11 (LL) 09/08/2018   NEUTROABS 4.2 09/08/2018    Urine Studies No results for input(s): UHGB, CRYS in the last 72 hours.  Invalid input(s): UACOL, UAPR, USPG, UPH, UTP, UGL, UKET, UBIL, UNIT, UROB, ULEU, UEPI, UWBC, URBC, UBAC, CAST, UCOM, BILUA  Basic Metabolic Panel: Recent Labs  Lab 09/07/18 1139 09/08/18 0639  NA 143 143  K 3.2* 3.7  CL 101 106  CO2 30 28  GLUCOSE 132* 116*  BUN 22 54*  CREATININE 1.54* 1.50*  CALCIUM 10.8* 10.0   GFR Estimated Creatinine Clearance: 42.6 mL/min (A) (by C-G formula based on SCr of 1.5 mg/dL (H)). Liver Function Tests: Recent Labs  Lab 09/07/18 1139  AST 112*  ALT 50*  ALKPHOS 140*  BILITOT 1.2  PROT 6.5  ALBUMIN 3.6   No results for input(s): LIPASE, AMYLASE in the last 168 hours. No results for input(s): AMMONIA in the last 168 hours. Coagulation profile Recent Labs  Lab 09/07/18 1139  INR 1.0     CBC: Recent Labs  Lab 09/07/18 1139 09/08/18 0639  WBC 5.6 6.7  NEUTROABS 3.6 4.2  HGB 13.3 10.2*  HCT 42.0 32.1*  MCV 92.7 94.4  PLT 5* 11*   Cardiac Enzymes: No results for input(s): CKTOTAL, CKMB, CKMBINDEX, TROPONINI in the last 168 hours. BNP: Invalid input(s): POCBNP CBG: No results for input(s): GLUCAP in the last 168 hours. D-Dimer Recent Labs    09/07/18 1139  DDIMER 16.83*   Hgb A1c No results for input(s): HGBA1C in the last 72 hours. Lipid Profile No results for input(s): CHOL, HDL, LDLCALC, TRIG, CHOLHDL, LDLDIRECT in the last 72 hours. Thyroid function studies No results for input(s): TSH, T4TOTAL, T3FREE, THYROIDAB in the last 72 hours.  Invalid input(s): FREET3 Anemia work up No results for input(s): VITAMINB12, FOLATE, FERRITIN, TIBC, IRON, RETICCTPCT in the last 72 hours. Microbiology Recent Results (from the past 240 hour(s))  SARS Coronavirus 2 [facility] order, Performed in [facility] hospital lab) Nasopharyngeal Nasopharyngeal Swab     Status: None   Collection Time: 09/07/18  1:08 PM   Specimen: Nasopharyngeal Swab  Result Value Ref Range Status   SARS Coronavirus 2 NEGATIVE NEGATIVE Final    Comment: (NOTE) If result is NEGATIVE  SARS-CoV-2 target nucleic acids are NOT DETECTED. The SARS-CoV-2 RNA is generally detectable in upper and lower  respiratory specimens during the acute phase of infection. The lowest  concentration of SARS-CoV-2 viral copies this assay can detect is 250  copies / mL. A negative result does not preclude SARS-CoV-2 infection  and should not be used as the sole basis for treatment or other  patient management decisions.  A negative result may occur with  improper specimen collection / handling, submission of specimen other  than nasopharyngeal swab, presence of viral mutation(s) within the  areas targeted by this assay, and inadequate number of viral copies  (<250 copies / mL). A negative result must be combined  with clinical  observations, patient history, and epidemiological information. If result is POSITIVE SARS-CoV-2 target nucleic acids are DETECTED. The SARS-CoV-2 RNA is generally detectable in upper and lower  respiratory specimens dur ing the acute phase of infection.  Positive  results are indicative of active infection with SARS-CoV-2.  Clinical  correlation with patient history and other diagnostic information is  necessary to determine patient infection status.  Positive results do  not rule out bacterial infection or co-infection with other viruses. If result is PRESUMPTIVE POSTIVE SARS-CoV-2 nucleic acids MAY BE PRESENT.   A presumptive positive result was obtained on the submitted specimen  and confirmed on repeat testing.  While 2019 novel coronavirus  (SARS-CoV-2) nucleic acids may be present in the submitted sample  additional confirmatory testing may be necessary for epidemiological  and / or clinical management purposes  to differentiate between  SARS-CoV-2 and other Sarbecovirus currently known to infect humans.  If clinically indicated additional testing with an alternate test  methodology [phone]) is advised. The SARS-CoV-2 RNA is generally  detectable in upper and lower respiratory sp ecimens during the acute  phase of infection. The expected result is Negative. Fact Sheet for Patients:  StrictlyIdeas.no Fact Sheet for Healthcare Providers: BankingDealers.co.za This test is not yet approved or cleared by the [place] FDA and has been authorized for detection and/or diagnosis of SARS-CoV-2 by FDA under an Emergency Use Authorization (EUA).  This EUA will remain in effect (meaning this test can be used) for the duration of the COVID-19 declaration under Section 564(b)(1) of the Act, 21 U.S.C. section 360bbb-3(b)(1), unless the authorization is terminated or revoked sooner. Performed at [facility], [place] [address], [place], [place] [address]   Culture, blood (routine x 2)     Status: None (Preliminary result)   Collection Time: 09/07/18  3:50 PM   Specimen: BLOOD LEFT FOREARM  Result Value Ref Range Status   Specimen Description   Final    BLOOD LEFT FOREARM Performed at [facility] [address] [place], [place] [address]    Special Requests   Final    BOTTLES DRAWN AEROBIC AND ANAEROBIC Blood Culture adequate volume Performed at [facility] [address], [place], [place] [address]    Culture   Final    NO GROWTH < 12 HOURS Performed at [facility] [address], [place], [place] [address]    Report Status PENDING  Incomplete  Culture, blood (routine x 2)     Status: None (Preliminary result)   Collection Time: 09/07/18  3:52 PM   Specimen: BLOOD RIGHT HAND  Result Value Ref Range Status   Specimen Description   Final    BLOOD RIGHT HAND Performed at [facility], [address] [place], [place] [address]    Special Requests   Final    BOTTLES DRAWN AEROBIC AND ANAEROBIC Blood Culture adequate volume Performed at [facility] [address] [place], [place] [address]    Culture   Final    NO GROWTH < 12 HOURS Performed at [facility] [address], [place], [place] [address]    Report Status PENDING  Incomplete      Studies:  Dg Chest 2 View  Result Date: 09/07/2018 CLINICAL DATA:  Shortness of breath and weakness EXAM: CHEST - 2 VIEW COMPARISON:  06/20/2018 FINDINGS: Cardiac shadow is stable. Postsurgical changes are again seen and stable. No focal infiltrate or sizable effusion is seen. No acute bony abnormality is noted. PICC line has been removed in the interval. IMPRESSION: No acute abnormality seen. Electronically Signed   By: Inez Catalina M.D.   On: 09/07/2018 10:29   Ct Angio Chest Pe W And/or Wo Contrast  Result Date: 09/07/2018 CLINICAL  DATA:  Intermediate probability for acute pulmonary embolus. Shortness of breath EXAM: CT ANGIOGRAPHY CHEST WITH CONTRAST TECHNIQUE: Multidetector CT imaging of the chest was performed using the standard protocol during bolus administration of intravenous contrast. Multiplanar CT image reconstructions and MIPs were obtained to evaluate the vascular anatomy. CONTRAST:  13m OMNIPAQUE IOHEXOL 350 MG/ML SOLN COMPARISON:  06/20/2018 FINDINGS: Cardiovascular: Normal heart size. Previous median sternotomy and CABG procedure. Aortic atherosclerosis. Main pulmonary artery is patent. No lobar or segmental pulmonary artery filling defects identified. Mediastinum/Nodes: Normal appearance of the thyroid gland. Small amount of aspirated debris is identified along the left lateral wall of the trachea. Normal appearance of the esophagus. No mediastinal adenopathy. New left hilar lymph node measures 1.4 cm, image 49/4. Lungs/Pleura: No pleural effusion. Moderate changes of emphysema. Central branching nodular density within the superior segment of left upper lobe is new when compared with the previous exam measuring 2.5 by 2.7 cm. Subpleural area of consolidation with within the posterior aspect of the superior segment of left lower lobe measures 2.5 x 1.7 cm, new from previous exam. Upper Abdomen: No acute abnormality. The contour the liver is irregular and there is hypertrophy of the lateral segment of left lobe of liver. Early cirrhosis cannot be excluded. Musculoskeletal: No chest wall abnormality. No acute or significant osseous findings. Review of the MIP images confirms the above findings. IMPRESSION: 1. No findings of acute pulmonary embolus. 2. There is a new central branching nodular density within the superior segment of left lower lobe. Adjacent nodule is identified within the posterior superior segment of left upper lobe measuring 2.7 cm. Also new from previous exam. In the acute setting, findings are favored to  represent an area of mucoid impaction and possible pneumonia. Differential considerations include primary bronchogenic carcinoma with endobronchial spread of tumor. Short-term interval follow-up imaging is recommended to ensure resolution and to rule out bronchogenic carcinoma in this patient who is at increased risk. 3. Enlarged left hilar lymph node. Also new. In the setting of pneumonia this may represent reactive adenopathy. Attention on short-term follow-up examination is recommended. 4. Debris is identified along the left lateral wall of the trachea. 5. Aortic Atherosclerosis (ICD10-I70.0) and Emphysema (ICD10-J43.9). 6. Morphologic features of the liver suggestive of early cirrhosis. Electronically Signed   By: TKerby MoorsM.D.   On: 09/07/2018 14:54    Assessment: 85y.o. male  1.  Metastatic small cell lung cancer to bone marrow, currently on atezolizumab maintenance therapy 2.  COPD 3.  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  Left lower lobe pneumonia, on Vanco and cefepime 5. HTN    Plan:  -pt is clinically stable, no bleeding. plt 11K today, will give one unit blood today to keep plt>=15K. I got a call from blood bank last night about crossed matched plt, which I think it's not absolutely necessary unless he is truely refractory to plt transfusion. I will order a CBC within one hour after plt transfusion.   -continue prednisone 12m daily  -he will have bone marrow biopsy on Monday or Tuesday by IR, order has been placed  -I reviewed his CT chest findings with pt and his wife, he has had cancer progression. Dr. SMickey Farberwill determine if he goes on second line chemo vs comfort care. -will f/u as needed tomorrow   YTruitt Merle MD 09/08/2018  12:02 PM

## 2018-09-08 NOTE — Progress Notes (Signed)
PROGRESS NOTE    Joseph Berg  BHA:[id number] DOB: [birth year] DOA: 09/07/2018 PCP: [name], MD     Brief Narrative:  Joseph Berg is a 85 y.o. male with medical history significant of CAD, chronic systolic heart failure, small cell lung cancer followed by Dr. Benay Spice, COPD, BPH who presents to the hospital with chief complaint of shortness of breath.  His wife is at bedside who is able to provide history.  She states that he started to complain of shortness of breath Wednesday evening.  Per their home pulse ox monitor, his oxygen saturation was in the 80s.  She contacted the cancer center today who recommended the patient be evaluated in the emergency department.  He also admits to a productive yellow sputum 2 to 3 days prior to shortness of breath.  Denies any fevers, chest pain, abdominal pain, nausea, vomiting.  Does admit to some mild peripheral edema.  He denies any blood loss, no epistaxis, blood in sputum, blood in stool. In the emergency department, patient was found to be hypoxic with pulse ox in the 80s on room air and improved to mid 90s with 2 L nasal cannula O2.  Labs show a creatinine 1.54, potassium 3.2, BNP 68, platelets 5.  SARS-CoV-2 negative.  CTA chest negative for PE but did reveal left lower lobe nodular density concerning for mucoid impaction versus pneumonia versus tumor.  Also showed enlarged left hilar lymph node.  Oncology consulted for thrombocytopenia.  New events last 24 hours / Subjective: Patient had some confusion overnight, pulling off telemetry leads and O2. This morning on my exam, he is alert, oriented to self, place, year.  He is a poor historian overall.  No complaints on my examination.  Assessment & Plan:   Principal Problem:   Thrombocytopenia (HCC) Active Problems:   HYPERTENSION, BENIGN ESSENTIAL   BPH (benign prostatic hyperplasia)   CAD (coronary artery disease) of artery bypass graft   Chronic systolic congestive heart  failure (HCC)   Acute hypoxemic respiratory failure (HCC)   Healthcare-associated pneumonia   Small cell lung cancer (HCC)   Hypokalemia   CKD (chronic kidney disease) stage 3, GFR 30-59 ml/min (HCC)    Thrombocytopenia -No signs of bleeding currently -Oncology consulted, planning for bone marrow biopsy 8/3.  Likely etiology due to progression of small cell carcinoma vs ITP vs atezolizumab -Transfuse platelets today, trend CBC   Acute hypoxemic respiratory failure -Secondary to left lower lobe pneumonia  -SARS-CoV-2 negative -Nasal cannula O2 to maintain sats greater than 88%  Left lower lobe pneumonia, HCAP  -Sepsis ruled out, pneumonia POA  -Vanco, cefepime -Check MRSA PCR.  If negative, stop vancomycin  Small cell lung cancer -Follows with Dr. Benay Spice  CKD stage III -Baseline creatinine 1.3 -Stable, continue to trend BMP  Chronic systolic heart failure -Without acute exacerbation at this time.  BNP 68  Essential hypertension -Blood pressure on the lower side 95/73.  Hold Lasix, Coreg, Cozaar  Hyperlipidemia -Continue Crestor            DVT prophylaxis: SCD Code Status: DNR Family Communication: Left voicemail for wife to call us back Disposition Plan: Pending further improvement, stabilization.  Plan for bone marrow biopsy on 8/3   Consultants:   Oncology  Procedures:   None  Antimicrobials:  Anti-infectives (From admission, onward)   Start     Dose/Rate Route Frequency Ordered Stop   09/08/18 1500  vancomycin (VANCOCIN) IVPB 750 mg/150 ml premix     750  mg 150 mL/hr over 60 Minutes Intravenous Every 24 hours 09/07/18 1705     09/08/18 0600  ceFEPIme (MAXIPIME) 2 g in sodium chloride 0.9 % 100 mL IVPB     2 g 200 mL/hr over 30 Minutes Intravenous Every 12 hours 09/07/18 1702     09/07/18 1530  ceFEPIme (MAXIPIME) 2 g in sodium chloride 0.9 % 100 mL IVPB     2 g 200 mL/hr over 30 Minutes Intravenous  Once 09/07/18 1519 09/07/18 1638     09/07/18 1530  vancomycin (VANCOCIN) 2,000 mg in sodium chloride 0.9 % 500 mL IVPB     2,000 mg 250 mL/hr over 120 Minutes Intravenous  Once 09/07/18 1519 09/07/18 1837       Objective: Vitals:   09/08/18 0551 09/08/18 0720 09/08/18 0722 09/08/18 1018  BP: (!) 99/55   95/73  Pulse: 93   100  Resp: 16   20  Temp: 98.4 F (36.9 C)   99.3 F (37.4 C)  TempSrc:    Oral  SpO2: (!) 89% 93% 93% 91%    Intake/Output Summary (Last 24 hours) at 09/08/2018 1047 Last data filed at 09/08/2018 1027 Gross per 24 hour  Intake 926 ml  Output 2 ml  Net 924 ml   There were no vitals filed for this visit.   Examination:  General exam: Appears calm and comfortable  Respiratory system: Respiratory effort normal. No conversational dyspnea.  Cardiovascular system: S1 & S2 heard, RRR. No JVD, murmurs, rubs, gallops or clicks. No pedal edema. Gastrointestinal system: Abdomen is nondistended, soft and nontender. No organomegaly or masses felt. Normal bowel sounds heard. Central nervous system: Alert and oriented.  Extremities: Symmetric 5 x 5 power. Psychiatry: Judgement and insight appear normal. Poor historian   Data Reviewed: I have personally reviewed following labs and imaging studies  CBC: Recent Labs  Lab 09/07/18 1139 09/08/18 0639  WBC 5.6 6.7  NEUTROABS 3.6 4.2  HGB 13.3 10.2*  HCT 42.0 32.1*  MCV 92.7 94.4  PLT 5* 11*   Basic Metabolic Panel: Recent Labs  Lab 09/07/18 1139 09/08/18 0639  NA 143 143  K 3.2* 3.7  CL 101 106  CO2 30 28  GLUCOSE 132* 116*  BUN 22 54*  CREATININE 1.54* 1.50*  CALCIUM 10.8* 10.0   GFR: Estimated Creatinine Clearance: 42.6 mL/min (A) (by C-G formula based on SCr of 1.5 mg/dL (H)). Liver Function Tests: Recent Labs  Lab 09/07/18 1139  AST 112*  ALT 50*  ALKPHOS 140*  BILITOT 1.2  PROT 6.5  ALBUMIN 3.6   No results for input(s): LIPASE, AMYLASE in the last 168 hours. No results for input(s): AMMONIA in the last 168  hours. Coagulation Profile: Recent Labs  Lab 09/07/18 1139  INR 1.0   Cardiac Enzymes: No results for input(s): CKTOTAL, CKMB, CKMBINDEX, TROPONINI in the last 168 hours. BNP (last 3 results) No results for input(s): PROBNP in the last 8760 hours. HbA1C: No results for input(s): HGBA1C in the last 72 hours. CBG: No results for input(s): GLUCAP in the last 168 hours. Lipid Profile: No results for input(s): CHOL, HDL, LDLCALC, TRIG, CHOLHDL, LDLDIRECT in the last 72 hours. Thyroid Function Tests: No results for input(s): TSH, T4TOTAL, FREET4, T3FREE, THYROIDAB in the last 72 hours. Anemia Panel: No results for input(s): VITAMINB12, FOLATE, FERRITIN, TIBC, IRON, RETICCTPCT in the last 72 hours. Sepsis Labs: No results for input(s): PROCALCITON, LATICACIDVEN in the last 168 hours.  Recent Results (from the past 240 hour(s))  SARS Coronavirus 2 [facility] order, Performed in [facility] hospital lab) Nasopharyngeal Nasopharyngeal Swab     Status: None   Collection Time: 09/07/18  1:08 PM   Specimen: Nasopharyngeal Swab  Result Value Ref Range Status   SARS Coronavirus 2 NEGATIVE NEGATIVE Final    Comment: (NOTE) If result is NEGATIVE SARS-CoV-2 target nucleic acids are NOT DETECTED. The SARS-CoV-2 RNA is generally detectable in upper and lower  respiratory specimens during the acute phase of infection. The lowest  concentration of SARS-CoV-2 viral copies this assay can detect is 250  copies / mL. A negative result does not preclude SARS-CoV-2 infection  and should not be used as the sole basis for treatment or other  patient management decisions.  A negative result may occur with  improper specimen collection / handling, submission of specimen other  than nasopharyngeal swab, presence of viral mutation(s) within the  areas targeted by this assay, and inadequate number of viral copies  (<250 copies / mL). A negative result must be combined with clinical  observations, patient  history, and epidemiological information. If result is POSITIVE SARS-CoV-2 target nucleic acids are DETECTED. The SARS-CoV-2 RNA is generally detectable in upper and lower  respiratory specimens dur ing the acute phase of infection.  Positive  results are indicative of active infection with SARS-CoV-2.  Clinical  correlation with patient history and other diagnostic information is  necessary to determine patient infection status.  Positive results do  not rule out bacterial infection or co-infection with other viruses. If result is PRESUMPTIVE POSTIVE SARS-CoV-2 nucleic acids MAY BE PRESENT.   A presumptive positive result was obtained on the submitted specimen  and confirmed on repeat testing.  While 2019 novel coronavirus  (SARS-CoV-2) nucleic acids may be present in the submitted sample  additional confirmatory testing may be necessary for epidemiological  and / or clinical management purposes  to differentiate between  SARS-CoV-2 and other Sarbecovirus currently known to infect humans.  If clinically indicated additional testing with an alternate test  methodology [phone]) is advised. The SARS-CoV-2 RNA is generally  detectable in upper and lower respiratory sp ecimens during the acute  phase of infection. The expected result is Negative. Fact Sheet for Patients:  StrictlyIdeas.no Fact Sheet for Healthcare Providers: BankingDealers.co.za This test is not yet approved or cleared by the [place] FDA and has been authorized for detection and/or diagnosis of SARS-CoV-2 by FDA under an Emergency Use Authorization (EUA).  This EUA will remain in effect (meaning this test can be used) for the duration of the COVID-19 declaration under Section 564(b)(1) of the Act, 21 U.S.C. section 360bbb-3(b)(1), unless the authorization is terminated or revoked sooner. Performed at [facility], [place] [address] [place],  [place] [address]   Culture, blood (routine x 2)     Status: None (Preliminary result)   Collection Time: 09/07/18  3:50 PM   Specimen: BLOOD LEFT FOREARM  Result Value Ref Range Status   Specimen Description   Final    BLOOD LEFT FOREARM Performed at [place] [address], [place], [place] [address]    Special Requests   Final    BOTTLES DRAWN AEROBIC AND ANAEROBIC Blood Culture adequate volume Performed at [facility] [address], [place], [place] [address]    Culture   Final    NO GROWTH < 12 HOURS Performed at [facility] [address], [place], [place] [address]    Report Status PENDING  Incomplete  Culture, blood (routine x 2)     Status: None (Preliminary result)   Collection Time: 09/07/18  3:52 PM   Specimen: BLOOD RIGHT HAND  Result Value Ref Range Status   Specimen Description   Final    BLOOD RIGHT HAND Performed at [facility] [address], [place], [place] [address]    Special Requests   Final    BOTTLES DRAWN AEROBIC AND ANAEROBIC Blood Culture adequate volume Performed at [facility] [address] [place], [place] [address]    Culture   Final    NO GROWTH < 12 HOURS Performed at [facility] [address] [place], [place] [address]    Report Status PENDING  Incomplete      Radiology Studies: Dg Chest 2 View  Result Date: 09/07/2018 CLINICAL DATA:  Shortness of breath and weakness EXAM: CHEST - 2 VIEW COMPARISON:  06/20/2018 FINDINGS: Cardiac shadow is stable. Postsurgical changes are again seen and stable. No focal infiltrate or sizable effusion is seen. No acute bony abnormality is noted. PICC line has been removed in the interval. IMPRESSION: No acute abnormality seen. Electronically Signed   By: Inez Catalina M.D.   On: 09/07/2018 10:29   Ct Angio Chest Pe W And/or Wo Contrast  Result Date: 09/07/2018 CLINICAL DATA:  Intermediate probability for  acute pulmonary embolus. Shortness of breath EXAM: CT ANGIOGRAPHY CHEST WITH CONTRAST TECHNIQUE: Multidetector CT imaging of the chest was performed using the standard protocol during bolus administration of intravenous contrast. Multiplanar CT image reconstructions and MIPs were obtained to evaluate the vascular anatomy. CONTRAST:  80m OMNIPAQUE IOHEXOL 350 MG/ML SOLN COMPARISON:  06/20/2018 FINDINGS: Cardiovascular: Normal heart size. Previous median sternotomy and CABG procedure. Aortic atherosclerosis. Main pulmonary artery is patent. No lobar or segmental pulmonary artery filling defects identified. Mediastinum/Nodes: Normal appearance of the thyroid gland. Small amount of aspirated debris is identified along the left lateral wall of the trachea. Normal appearance of the esophagus. No mediastinal adenopathy. New left hilar lymph node measures 1.4 cm, image 49/4. Lungs/Pleura: No pleural effusion. Moderate changes of emphysema. Central branching nodular density within the superior segment of left upper lobe is new when compared with the previous exam measuring 2.5 by 2.7 cm. Subpleural area of consolidation with within the posterior aspect of the superior segment of left lower lobe measures 2.5 x 1.7 cm, new from previous exam. Upper Abdomen: No acute abnormality. The contour the liver is irregular and there is hypertrophy of the lateral segment of left lobe of liver. Early cirrhosis cannot be excluded. Musculoskeletal: No chest wall abnormality. No acute or significant osseous findings. Review of the MIP images confirms the above findings. IMPRESSION: 1. No findings of acute pulmonary embolus. 2. There is a new central branching nodular density within the superior segment of left lower lobe. Adjacent nodule is identified within the posterior superior segment of left upper lobe measuring 2.7 cm. Also new from previous exam. In the acute setting, findings are favored to represent an area of mucoid impaction and  possible pneumonia. Differential considerations include primary bronchogenic carcinoma with endobronchial spread of tumor. Short-term interval follow-up imaging is recommended to ensure resolution and to rule out bronchogenic carcinoma in this patient who is at increased risk. 3. Enlarged left hilar lymph node. Also new. In the setting of pneumonia this may represent reactive adenopathy. Attention on short-term follow-up examination is recommended. 4. Debris is identified along the left lateral wall of the trachea.  5. Aortic Atherosclerosis (ICD10-I70.0) and Emphysema (ICD10-J43.9). 6. Morphologic features of the liver suggestive of early cirrhosis. Electronically Signed   By: Kerby Moors M.D.   On: 09/07/2018 14:54      Scheduled Meds:  sodium chloride   Intravenous Once   diphenhydrAMINE  50 mg Oral QHS   And   acetaminophen  1,000 mg Oral QHS   finasteride  5 mg Oral QPM   fluticasone furoate-vilanterol  1 puff Inhalation Daily   And   umeclidinium bromide  1 puff Inhalation Daily   loratadine  10 mg Oral Daily   predniSONE  60 mg Oral Q breakfast   rosuvastatin  40 mg Oral Daily   Continuous Infusions:  ceFEPime (MAXIPIME) IV 2 g (09/08/18 0649)   vancomycin       LOS: 1 day      Time spent: 25 minutes   Dessa Phi, DO [facility] [email or web address] 09/08/2018, 10:47 AM

## 2018-09-09 LAB — BASIC METABOLIC PANEL
Anion gap: 11 (ref 5–15)
BUN: 61 mg/dL — ABNORMAL HIGH (ref 8–23)
CO2: 26 mmol/L (ref 22–32)
Calcium: 10.5 mg/dL — ABNORMAL HIGH (ref 8.9–10.3)
Chloride: 107 mmol/L (ref 98–111)
Creatinine, Ser: 1.45 mg/dL — ABNORMAL HIGH (ref 0.61–1.24)
GFR calc Af Amer: 51 mL/min — ABNORMAL LOW (ref >60)
GFR calc non Af Amer: 44 mL/min — ABNORMAL LOW (ref >60)
Glucose, Bld: 121 mg/dL — ABNORMAL HIGH (ref 70–99)
Potassium: 3.1 mmol/L — ABNORMAL LOW (ref 3.5–5.1)
Sodium: 144 mmol/L (ref 135–145)

## 2018-09-09 LAB — BPAM PLATELET PHERESIS
Blood Product Expiration Date: 202008022359
Blood Product Expiration Date: 202008032359
Blood Product Expiration Date: 202008032359
ISSUE DATE / TIME: 202008010040
ISSUE DATE / TIME: 202008011631
ISSUE DATE / TIME: 202008020457
Unit Type and Rh: 6200
Unit Type and Rh: 5100
Unit Type and Rh: 600

## 2018-09-09 LAB — PREPARE PLATELET PHERESIS
Unit division: 0
Unit division: 0
Unit division: 0

## 2018-09-09 MED ORDER — POTASSIUM CHLORIDE CRYS ER 20 MEQ PO TBCR
40.0000 meq | EXTENDED_RELEASE_TABLET | Freq: Two times a day (BID) | ORAL | Status: AC
  Administered 2018-09-09 (×2): 40 meq via ORAL
  Filled 2018-09-09 (×2): qty 2

## 2018-09-09 MED ORDER — CARVEDILOL 12.5 MG PO TABS
12.5000 mg | ORAL_TABLET | Freq: Two times a day (BID) | ORAL | Status: DC
  Administered 2018-09-09 – 2018-09-12 (×6): 12.5 mg via ORAL
  Filled 2018-09-09 (×6): qty 1

## 2018-09-09 MED ORDER — TRAZODONE HCL 50 MG PO TABS
50.0000 mg | ORAL_TABLET | Freq: Every evening | ORAL | Status: DC | PRN
  Administered 2018-09-09 – 2018-09-10 (×2): 50 mg via ORAL
  Filled 2018-09-09 (×2): qty 1

## 2018-09-09 NOTE — Progress Notes (Signed)
PROGRESS NOTE    Joseph Berg  IOM:[id number] DOB: 1933/02/28 DOA: 09/07/2018 PCP: [name], MD     Brief Narrative:  Joseph Berg is a 85 y.o. male with medical history significant of CAD, chronic systolic heart failure, small cell lung cancer followed by Dr. Benay Spice, COPD, BPH who presents to the hospital with chief complaint of shortness of breath.  His wife is at bedside who is able to provide history.  She states that he started to complain of shortness of breath Wednesday evening.  Per their home pulse ox monitor, his oxygen saturation was in the 80s.  She contacted the cancer center today who recommended the patient be evaluated in the emergency department.  He also admits to a productive yellow sputum 2 to 3 days prior to shortness of breath.  Denies any fevers, chest pain, abdominal pain, nausea, vomiting.  Does admit to some mild peripheral edema.  He denies any blood loss, no epistaxis, blood in sputum, blood in stool. In the emergency department, patient was found to be hypoxic with pulse ox in the 80s on room air and improved to mid 90s with 2 L nasal cannula O2.  Labs show a creatinine 1.54, potassium 3.2, BNP 68, platelets 5.  SARS-CoV-2 negative.  CTA chest negative for PE but did reveal left lower lobe nodular density concerning for mucoid impaction versus pneumonia versus tumor.  Also showed enlarged left hilar lymph node.  Oncology consulted for thrombocytopenia.  New events last 24 hours / Subjective: No complaints this morning.  Denies any worsening in shortness of breath.  Assessment & Plan:   Principal Problem:   Thrombocytopenia (HCC) Active Problems:   HYPERTENSION, BENIGN ESSENTIAL   BPH (benign prostatic hyperplasia)   CAD (coronary artery disease) of artery bypass graft   Chronic systolic congestive heart failure (HCC)   Acute hypoxemic respiratory failure (HCC)   Healthcare-associated pneumonia   Small cell lung cancer (HCC)  Hypokalemia   CKD (chronic kidney disease) stage 3, GFR 30-59 ml/min (HCC)    Thrombocytopenia -No signs of bleeding currently -Oncology consulted, planning for bone marrow biopsy 8/3.  Likely etiology due to progression of small cell carcinoma vs ITP vs atezolizumab -Transfuse for platelets less than 15,000 -Prednisone  Acute hypoxemic respiratory failure -Secondary to left lower lobe pneumonia  -SARS-CoV-2 negative -Nasal cannula O2 to maintain sats greater than 88% -Off of oxygen this morning  Left lower lobe pneumonia, HCAP  -Sepsis ruled out, pneumonia POA  -MRSA PCR negative, stop vancomycin -Continue cefepime  Small cell lung cancer -Follows with Dr. Benay Spice  CKD stage III -Baseline creatinine 1.3 -Stable, continue to trend BMP.  Creatinine 1.45 today   Chronic systolic heart failure -Without acute exacerbation at this time.  BNP 68  Essential hypertension -Resume Coreg, continue to hold Lasix, Cozaar -will resume as blood pressure tolerated  Hyperlipidemia -Continue Crestor  Hypokalemia -Replace, trend        DVT prophylaxis: SCD Code Status: DNR Family Communication: Spoke with wife over the phone  Disposition Plan: Pending further improvement, stabilization.  Plan for bone marrow biopsy on 8/3   Consultants:   Oncology  Procedures:   None  Antimicrobials:  Anti-infectives (From admission, onward)   Start     Dose/Rate Route Frequency Ordered Stop   09/08/18 1500  vancomycin (VANCOCIN) IVPB 750 mg/150 ml premix  Status:  Discontinued     750 mg 150 mL/hr over 60 Minutes Intravenous Every 24 hours 09/07/18 1705 09/09/18 1030  09/08/18 0600  ceFEPIme (MAXIPIME) 2 g in sodium chloride 0.9 % 100 mL IVPB     2 g 200 mL/hr over 30 Minutes Intravenous Every 12 hours 09/07/18 1702     09/07/18 1530  ceFEPIme (MAXIPIME) 2 g in sodium chloride 0.9 % 100 mL IVPB     2 g 200 mL/hr over 30 Minutes Intravenous  Once 09/07/18 1519 09/07/18 1638     09/07/18 1530  vancomycin (VANCOCIN) 2,000 mg in sodium chloride 0.9 % 500 mL IVPB     2,000 mg 250 mL/hr over 120 Minutes Intravenous  Once 09/07/18 1519 09/07/18 1837       Objective: Vitals:   09/08/18 2027 09/09/18 0525 09/09/18 0714 09/09/18 1307  BP: 119/62 (!) 144/68  137/66  Pulse: 95 85  90  Resp: _0 Temp: 98.3 F (36.8 C) 98.6 F (37 C)  98.6 F (37 C)  TempSrc: Oral Oral  Oral  SpO2: 97% 92% 95% 93%    Intake/Output Summary (Last 24 hours) at 09/09/2018 1310 Last data filed at 09/09/2018 1142 Gross per 24 hour  Intake 1516.5 ml  Output 1101 ml  Net 415.5 ml   There were no vitals filed for this visit.   Examination: General exam: Appears calm and comfortable  Respiratory system: Clear to auscultation. Respiratory effort normal. Cardiovascular system: S1 & S2 heard, RRR. No JVD, murmurs, rubs, gallops or clicks. No pedal edema. Gastrointestinal system: Abdomen is nondistended, soft and nontender. No organomegaly or masses felt. Normal bowel sounds heard. Central nervous system: Alert and oriented. No focal neurological deficits. Extremities: Symmetric 5 x 5 power. Skin: No rashes, lesions or ulcers Psychiatry: Judgement and insight appear stable    Data Reviewed: I have personally reviewed following labs and imaging studies  CBC: Recent Labs  Lab 09/07/18 1139 09/08/18 0639 09/08/18 2016  WBC 5.6 6.7 8.4  NEUTROABS 3.6 4.2 6.6  HGB 13.3 10.2* 9.8*  HCT 42.0 32.1* 31.1*  MCV 92.7 94.4 95.1  PLT 5* 11* 39*   Basic Metabolic Panel: Recent Labs  Lab 09/07/18 1139 09/08/18 0639 09/09/18 0700  NA 143 143 144  K 3.2* 3.7 3.1*  CL 101 106 107  CO2 _1 GLUCOSE 132* 116* 121*  BUN 22 54* 61*  CREATININE 1.54* 1.50* 1.45*  CALCIUM 10.8* 10.0 10.5*   GFR: Estimated Creatinine Clearance: 44.1 mL/min (A) (by C-G formula based on SCr of 1.45 mg/dL (H)). Liver Function Tests: Recent Labs  Lab 09/07/18 1139  AST 112*  ALT 50*   ALKPHOS 140*  BILITOT 1.2  PROT 6.5  ALBUMIN 3.6   No results for input(s): LIPASE, AMYLASE in the last 168 hours. No results for input(s): AMMONIA in the last 168 hours. Coagulation Profile: Recent Labs  Lab 09/07/18 1139  INR 1.0   Cardiac Enzymes: No results for input(s): CKTOTAL, CKMB, CKMBINDEX, TROPONINI in the last 168 hours. BNP (last 3 results) No results for input(s): PROBNP in the last 8760 hours. HbA1C: No results for input(s): HGBA1C in the last 72 hours. CBG: No results for input(s): GLUCAP in the last 168 hours. Lipid Profile: No results for input(s): CHOL, HDL, LDLCALC, TRIG, CHOLHDL, LDLDIRECT in the last 72 hours. Thyroid Function Tests: No results for input(s): TSH, T4TOTAL, FREET4, T3FREE, THYROIDAB in the last 72 hours. Anemia Panel: No results for input(s): VITAMINB12, FOLATE, FERRITIN, TIBC, IRON, RETICCTPCT in the last 72 hours. Sepsis Labs: No results for input(s): PROCALCITON, LATICACIDVEN in the last  168 hours.  Recent Results (from the past 240 hour(s))  SARS Coronavirus 2 [facility] order, Performed in [facility] lab) Nasopharyngeal Nasopharyngeal Swab     Status: None   Collection Time: 09/07/18  1:08 PM   Specimen: Nasopharyngeal Swab  Result Value Ref Range Status   SARS Coronavirus 2 NEGATIVE NEGATIVE Final    Comment: (NOTE) If result is NEGATIVE SARS-CoV-2 target nucleic acids are NOT DETECTED. The SARS-CoV-2 RNA is generally detectable in upper and lower  respiratory specimens during the acute phase of infection. The lowest  concentration of SARS-CoV-2 viral copies this assay can detect is 250  copies / mL. A negative result does not preclude SARS-CoV-2 infection  and should not be used as the sole basis for treatment or other  patient management decisions.  A negative result may occur with  improper specimen collection / handling, submission of specimen other  than nasopharyngeal swab, presence of viral mutation(s) within  the  areas targeted by this assay, and inadequate number of viral copies  (<250 copies / mL). A negative result must be combined with clinical  observations, patient history, and epidemiological information. If result is POSITIVE SARS-CoV-2 target nucleic acids are DETECTED. The SARS-CoV-2 RNA is generally detectable in upper and lower  respiratory specimens dur ing the acute phase of infection.  Positive  results are indicative of active infection with SARS-CoV-2.  Clinical  correlation with patient history and other diagnostic information is  necessary to determine patient infection status.  Positive results do  not rule out bacterial infection or co-infection with other viruses. If result is PRESUMPTIVE POSTIVE SARS-CoV-2 nucleic acids MAY BE PRESENT.   A presumptive positive result was obtained on the submitted specimen  and confirmed on repeat testing.  While 2019 novel coronavirus  (SARS-CoV-2) nucleic acids may be present in the submitted sample  additional confirmatory testing may be necessary for epidemiological  and / or clinical management purposes  to differentiate between  SARS-CoV-2 and other Sarbecovirus currently known to infect humans.  If clinically indicated additional testing with an alternate test  methodology [phone]) is advised. The SARS-CoV-2 RNA is generally  detectable in upper and lower respiratory sp ecimens during the acute  phase of infection. The expected result is Negative. Fact Sheet for Patients:  StrictlyIdeas.no Fact Sheet for Healthcare Providers: BankingDealers.co.za This test is not yet approved or cleared by the [place] FDA and has been authorized for detection and/or diagnosis of SARS-CoV-2 by FDA under an Emergency Use Authorization (EUA).  This EUA will remain in effect (meaning this test can be used) for the duration of the COVID-19 declaration under Section 564(b)(1) of the Act, 21  U.S.C. section 360bbb-3(b)(1), unless the authorization is terminated or revoked sooner. Performed at [facility], [place] [address], [place], [place] [address]   Culture, blood (routine x 2)     Status: None (Preliminary result)   Collection Time: 09/07/18  3:50 PM   Specimen: BLOOD LEFT FOREARM  Result Value Ref Range Status   Specimen Description   Final    BLOOD LEFT FOREARM Performed at [facility] [address] [place], [place] [address]    Special Requests   Final    BOTTLES DRAWN AEROBIC AND ANAEROBIC Blood Culture adequate volume Performed at [facility] [address] [place], [place] [address]    Culture   Final    NO GROWTH 2 DAYS Performed at [facility]  [address], [place], [place] [address]    Report Status PENDING  Incomplete  Culture, blood (routine x 2)     Status: None (Preliminary result)   Collection Time: 09/07/18  3:52 PM   Specimen: BLOOD RIGHT HAND  Result Value Ref Range Status   Specimen Description   Final    BLOOD RIGHT HAND Performed at [facility] [address] [place], [place] [address]    Special Requests   Final    BOTTLES DRAWN AEROBIC AND ANAEROBIC Blood Culture adequate volume Performed at [facility] [address], [place], [place] [address]    Culture   Final    NO GROWTH 2 DAYS Performed at [facility] [address] [place], [place] [address]    Report Status PENDING  Incomplete  MRSA PCR Screening     Status: None   Collection Time: 09/07/18  6:45 PM   Specimen: Nasal Mucosa; Nasopharyngeal  Result Value Ref Range Status   MRSA by PCR NEGATIVE NEGATIVE Final    Comment:        The GeneXpert MRSA Assay (FDA approved for NASAL specimens only), is one component of a comprehensive MRSA colonization surveillance program. It is not intended to diagnose MRSA infection nor to guide or monitor treatment  for MRSA infections. Performed at [facility], [place] [address] [place], [place] [address]       Radiology Studies: Ct Angio Chest Pe W And/or Wo Contrast  Result Date: 09/07/2018 CLINICAL DATA:  Intermediate probability for acute pulmonary embolus. Shortness of breath EXAM: CT ANGIOGRAPHY CHEST WITH CONTRAST TECHNIQUE: Multidetector CT imaging of the chest was performed using the standard protocol during bolus administration of intravenous contrast. Multiplanar CT image reconstructions and MIPs were obtained to evaluate the vascular anatomy. CONTRAST:  55m OMNIPAQUE IOHEXOL 350 MG/ML SOLN COMPARISON:  06/20/2018 FINDINGS: Cardiovascular: Normal heart size. Previous median sternotomy and CABG procedure. Aortic atherosclerosis. Main pulmonary artery is patent. No lobar or segmental pulmonary artery filling defects identified. Mediastinum/Nodes: Normal appearance of the thyroid gland. Small amount of aspirated debris is identified along the left lateral wall of the trachea. Normal appearance of the esophagus. No mediastinal adenopathy. New left hilar lymph node measures 1.4 cm, image 49/4. Lungs/Pleura: No pleural effusion. Moderate changes of emphysema. Central branching nodular density within the superior segment of left upper lobe is new when compared with the previous exam measuring 2.5 by 2.7 cm. Subpleural area of consolidation with within the posterior aspect of the superior segment of left lower lobe measures 2.5 x 1.7 cm, new from previous exam. Upper Abdomen: No acute abnormality. The contour the liver is irregular and there is hypertrophy of the lateral segment of left lobe of liver. Early cirrhosis cannot be excluded. Musculoskeletal: No chest wall abnormality. No acute or significant osseous findings. Review of the MIP images confirms the above findings. IMPRESSION: 1. No findings of acute pulmonary embolus. 2. There is a new central branching nodular density within the  superior segment of left lower lobe. Adjacent nodule is identified within the posterior superior segment of left upper lobe measuring 2.7 cm. Also new from previous exam. In the acute setting, findings are favored to represent an area of mucoid impaction and possible pneumonia. Differential considerations include primary bronchogenic carcinoma with endobronchial spread of tumor. Short-term interval follow-up imaging is recommended to ensure resolution and to rule out bronchogenic carcinoma in this patient who is at increased risk. 3. Enlarged left hilar lymph node.  Also new. In the setting of pneumonia this may represent reactive adenopathy. Attention on short-term follow-up examination is recommended. 4. Debris is identified along the left lateral wall of the trachea. 5. Aortic Atherosclerosis (ICD10-I70.0) and Emphysema (ICD10-J43.9). 6. Morphologic features of the liver suggestive of early cirrhosis. Electronically Signed   By: Kerby Moors M.D.   On: 09/07/2018 14:54      Scheduled Meds:  finasteride  5 mg Oral QPM   fluticasone furoate-vilanterol  1 puff Inhalation Daily   And   umeclidinium bromide  1 puff Inhalation Daily   loratadine  10 mg Oral Daily   predniSONE  60 mg Oral Q breakfast   rosuvastatin  40 mg Oral Daily   Continuous Infusions:  ceFEPime (MAXIPIME) IV 2 g (09/09/18 0952)     LOS: 2 days      Time spent: 25 minutes   Dessa Phi, DO [facility] [email or web address] 09/09/2018, 1:10 PM

## 2018-09-09 NOTE — Plan of Care (Signed)
  Problem: Nutrition: Goal: Adequate nutrition will be maintained Outcome: Progressing   Problem: Elimination: Goal: Will not experience complications related to bowel motility Outcome: Progressing   Problem: Pain Managment: Goal: General experience of comfort will improve Outcome: Progressing   

## 2018-09-10 LAB — CBC WITH DIFFERENTIAL/PLATELET
Abs Immature Granulocytes: 0.09 10*3/uL — ABNORMAL HIGH (ref 0.00–0.07)
Abs Immature Granulocytes: 0.18 10*3/uL — ABNORMAL HIGH (ref 0.00–0.07)
Basophils Absolute: 0 10*3/uL (ref 0.0–0.1)
Basophils Absolute: 0 10*3/uL (ref 0.0–0.1)
Basophils Relative: 0 %
Basophils Relative: 0 %
Eosinophils Absolute: 0 10*3/uL (ref 0.0–0.5)
Eosinophils Absolute: 0 10*3/uL (ref 0.0–0.5)
Eosinophils Relative: 0 %
Eosinophils Relative: 0 %
HCT: 30.9 % — ABNORMAL LOW (ref 39.0–52.0)
HCT: 30.6 % — ABNORMAL LOW (ref 39.0–52.0)
Hemoglobin: 9.7 g/dL — ABNORMAL LOW (ref 13.0–17.0)
Hemoglobin: 9.8 g/dL — ABNORMAL LOW (ref 13.0–17.0)
Immature Granulocytes: 1 %
Immature Granulocytes: 2 %
Lymphocytes Relative: 16 %
Lymphocytes Relative: 12 %
Lymphs Abs: 1.4 10*3/uL (ref 0.7–4.0)
Lymphs Abs: 1.4 10*3/uL (ref 0.7–4.0)
MCH: 30.1 pg (ref 26.0–34.0)
MCH: 31 pg (ref 26.0–34.0)
MCHC: 31.4 g/dL (ref 30.0–36.0)
MCHC: 32 g/dL (ref 30.0–36.0)
MCV: 96 fL (ref 80.0–100.0)
MCV: 96.8 fL (ref 80.0–100.0)
Monocytes Absolute: 0.7 10*3/uL (ref 0.1–1.0)
Monocytes Absolute: 0.4 10*3/uL (ref 0.1–1.0)
Monocytes Relative: 7 %
Monocytes Relative: 4 %
Neutro Abs: 6.7 10*3/uL (ref 1.7–7.7)
Neutro Abs: 9.5 10*3/uL — ABNORMAL HIGH (ref 1.7–7.7)
Neutrophils Relative %: 76 %
Neutrophils Relative %: 82 %
Platelets: 5 10*3/uL — CL (ref 150–400)
Platelets: 17 10*3/uL — CL (ref 150–400)
RBC: 3.22 MIL/uL — ABNORMAL LOW (ref 4.22–5.81)
RBC: 3.16 MIL/uL — ABNORMAL LOW (ref 4.22–5.81)
RDW: 13.9 % (ref 11.5–15.5)
RDW: 13.9 % (ref 11.5–15.5)
WBC: 8.8 10*3/uL (ref 4.0–10.5)
WBC: 11.5 10*3/uL — ABNORMAL HIGH (ref 4.0–10.5)
nRBC: 0 % (ref 0.0–0.2)
nRBC: 0.3 % — ABNORMAL HIGH (ref 0.0–0.2)

## 2018-09-10 LAB — BASIC METABOLIC PANEL
Anion gap: 9 (ref 5–15)
BUN: 51 mg/dL — ABNORMAL HIGH (ref 8–23)
CO2: 25 mmol/L (ref 22–32)
Calcium: 10.4 mg/dL — ABNORMAL HIGH (ref 8.9–10.3)
Chloride: 113 mmol/L — ABNORMAL HIGH (ref 98–111)
Creatinine, Ser: 1.29 mg/dL — ABNORMAL HIGH (ref 0.61–1.24)
GFR calc Af Amer: 59 mL/min — ABNORMAL LOW (ref >60)
GFR calc non Af Amer: 51 mL/min — ABNORMAL LOW (ref >60)
Glucose, Bld: 121 mg/dL — ABNORMAL HIGH (ref 70–99)
Potassium: 4 mmol/L (ref 3.5–5.1)
Sodium: 147 mmol/L — ABNORMAL HIGH (ref 135–145)

## 2018-09-10 LAB — PROTIME-INR
INR: 1.1 (ref 0.8–1.2)
Prothrombin Time: 14.3 seconds (ref 11.4–15.2)

## 2018-09-10 LAB — MAGNESIUM: Magnesium: 2.2 mg/dL (ref 1.7–2.4)

## 2018-09-10 MED ORDER — SODIUM CHLORIDE 0.9% IV SOLUTION
Freq: Once | INTRAVENOUS | Status: AC
  Administered 2018-09-10: 10:00:00 via INTRAVENOUS

## 2018-09-10 MED ORDER — FUROSEMIDE 40 MG PO TABS
40.0000 mg | ORAL_TABLET | Freq: Every day | ORAL | Status: DC
  Administered 2018-09-10 – 2018-09-12 (×3): 40 mg via ORAL
  Filled 2018-09-10 (×3): qty 1

## 2018-09-10 NOTE — Progress Notes (Signed)
Received call from blood bank to verify if platelets need to be cross matched. Dr. Maylene Roes notified and has given the ok to administer non cross matched platelets.

## 2018-09-10 NOTE — Care Management Important Message (Signed)
Important Message  Patient Details IM Letter given to Sharren Bridge SW to present to the Patient Name: Joseph Berg MRN: [id number] Date of Birth: Mar 19, 1933   Medicare Important Message Given:  Yes     Kerin Salen 09/10/2018, 1:05 PM

## 2018-09-10 NOTE — Progress Notes (Signed)
Pharmacy Antibiotic Note  Joseph Berg is a 85 y.o. male with a history of COPD and small cell lung cancer presented to the ED on 09/07/18 with c/o SOB. O2 sat at home was in the 80s, and pt had cough with yellow sputum production. CTA chest showed possible PNA in left lower lobe. Pt admitted on 09/07/2018 and is being treated for  pneumonia. Started on vancomycin and cefepime. Vancomycin discontinued on 09/08/18 by MD d/t negative MRSA PCR. Pharmacy has been consulted for cefepime dosing.   Assessment, 09/10/18:  - Day 4 of IV Abx  - WBC stable, WNL, afebrile - Scr improving, 1.29 today, CrCl 49  - Oxygen stable on 2L of O2  Plan: - Continue cefepime 2g q12h  - Monitor renal function      Temp (24hrs), Avg:98.2 F (36.8 C), Min:97.6 F (36.4 C), Max:98.6 F (37 C)  Recent Labs  Lab 09/07/18 1139 09/08/18 0639 09/08/18 2016 09/09/18 0700 09/10/18 0622  WBC 5.6 6.7 8.4  --  8.8  CREATININE 1.54* 1.50*  --  1.45* 1.29*    CrCl cannot be calculated (Unknown ideal weight.).  Calculated using adjusted body wt; CrCl 49   Allergies  Allergen Reactions  . Hydrochlorothiazide Other (See Comments)    Affected renal function.  "affected kidneys and calcium level"    Antimicrobials this admission: Cefepime 7/31 >>  Vancomycin 7/31 >> 8/2   Thank you for allowing pharmacy to be a part of this patient's care.  Jonathon Bellows 09/10/2018 9:04 AM

## 2018-09-10 NOTE — Progress Notes (Signed)
CRITICAL VALUE ALERT  Critical Value:  Platelet level of 5 Date & Time Notied:  09/10/2018@0825   Provider Notified: Dr. Maylene Roes  Orders Received/Actions taken: Orders recieved

## 2018-09-10 NOTE — Progress Notes (Signed)
PROGRESS NOTE    Joseph Berg  IDP:[id number] DOB: [birth year] DOA: 09/07/2018 PCP: [name], MD     Brief Narrative:  Joseph Berg is a 85 y.o. male with medical history significant of CAD, chronic systolic heart failure, small cell lung cancer followed by Dr. Benay Spice, COPD, BPH who presents to the hospital with chief complaint of shortness of breath.  His wife is at bedside who is able to provide history.  She states that he started to complain of shortness of breath Wednesday evening.  Per their home pulse ox monitor, his oxygen saturation was in the 80s.  She contacted the cancer center today who recommended the patient be evaluated in the emergency department.  He also admits to a productive yellow sputum 2 to 3 days prior to shortness of breath.  Denies any fevers, chest pain, abdominal pain, nausea, vomiting.  Does admit to some mild peripheral edema.  He denies any blood loss, no epistaxis, blood in sputum, blood in stool. In the emergency department, patient was found to be hypoxic with pulse ox in the 80s on room air and improved to mid 90s with 2 L nasal cannula O2.  Labs show a creatinine 1.54, potassium 3.2, BNP 68, platelets 5.  SARS-CoV-2 negative.  CTA chest negative for PE but did reveal left lower lobe nodular density concerning for mucoid impaction versus pneumonia versus tumor.  Also showed enlarged left hilar lymph node.  Oncology consulted for thrombocytopenia.  New events last 24 hours / Subjective: Feeling well, no bleeding noted.  Wondering when he can go home  Assessment & Plan:   Principal Problem:   Thrombocytopenia (HCC) Active Problems:   HYPERTENSION, BENIGN ESSENTIAL   BPH (benign prostatic hyperplasia)   CAD (coronary artery disease) of artery bypass graft   Chronic systolic congestive heart failure (HCC)   Acute hypoxemic respiratory failure (HCC)   Healthcare-associated pneumonia   Small cell lung cancer (HCC)   Hypokalemia   CKD  (chronic kidney disease) stage 3, GFR 30-59 ml/min (HCC)    Thrombocytopenia -No signs of bleeding currently -Oncology consulted, tentative bone marrow biopsy 8/3.  Likely etiology due to progression of small cell carcinoma vs ITP vs atezolizumab -Transfuse for platelets less than 15,000 -Prednisone -Transfuse platelets again today  Acute hypoxemic respiratory failure -Secondary to left lower lobe pneumonia  -SARS-CoV-2 negative -Nasal cannula O2 to maintain sats greater than 88% -Wean O2, home desat screen   Left lower lobe pneumonia, HCAP  -Sepsis ruled out, pneumonia POA  -MRSA PCR negative, stop vancomycin -Continue cefepime  Small cell lung cancer -Follows with Dr. Benay Spice  CKD stage III -Baseline creatinine 1.3 -Stable, continue to trend BMP.  Creatinine 1.29 today   Chronic systolic heart failure -Without acute exacerbation at this time.  BNP 68  Essential hypertension -Resume Coreg, resume Lasix. Hold Cozaar -will resume as blood pressure tolerated  Hyperlipidemia -Continue Crestor         DVT prophylaxis: SCD Code Status: DNR Family Communication: None  Disposition Plan: Pending further improvement, stabilization.  Tentative bone marrow biopsy on 8/3   Consultants:   Oncology  Procedures:   None  Antimicrobials:  Anti-infectives (From admission, onward)   Start     Dose/Rate Route Frequency Ordered Stop   09/08/18 1500  vancomycin (VANCOCIN) IVPB 750 mg/150 ml premix  Status:  Discontinued     750 mg 150 mL/hr over 60 Minutes Intravenous Every 24 hours 09/07/18 1705 09/09/18 1030   09/08/18 0600  ceFEPIme (MAXIPIME) 2 g in sodium chloride 0.9 % 100 mL IVPB     2 g 200 mL/hr over 30 Minutes Intravenous Every 12 hours 09/07/18 1702     09/07/18 1530  ceFEPIme (MAXIPIME) 2 g in sodium chloride 0.9 % 100 mL IVPB     2 g 200 mL/hr over 30 Minutes Intravenous  Once 09/07/18 1519 09/07/18 1638   09/07/18 1530  vancomycin (VANCOCIN) 2,000  mg in sodium chloride 0.9 % 500 mL IVPB     2,000 mg 250 mL/hr over 120 Minutes Intravenous  Once 09/07/18 1519 09/07/18 1837       Objective: Vitals:   09/09/18 2100 09/10/18 0500 09/10/18 0750 09/10/18 0751  BP: 139/70 140/62    Pulse: 87 74    Resp: 20 20    Temp: 98.4 F (36.9 C) 97.6 F (36.4 C)    TempSrc: Oral Oral    SpO2: 98% 96% (!) 88% 95%    Intake/Output Summary (Last 24 hours) at 09/10/2018 1149 Last data filed at 09/10/2018 1013 Gross per 24 hour  Intake 707.69 ml  Output 875 ml  Net -167.31 ml   There were no vitals filed for this visit.   Examination: General exam: Appears calm and comfortable  Respiratory system: Normal respiratory effort Cardiovascular system: S1 & S2 heard, RRR. No JVD, murmurs, rubs, gallops or clicks. No pedal edema. Gastrointestinal system: Abdomen is nondistended, soft and nontender. No organomegaly or masses felt. Normal bowel sounds heard. Central nervous system: Alert and oriented. No focal neurological deficits. Extremities: Symmetric 5 x 5 power. Skin: No rashes, lesions or ulcers Psychiatry: Judgement and insight appear normal. Mood & affect appropriate.    Data Reviewed: I have personally reviewed following labs and imaging studies  CBC: Recent Labs  Lab 09/07/18 1139 09/08/18 0639 09/08/18 2016 09/10/18 0622  WBC 5.6 6.7 8.4 8.8  NEUTROABS 3.6 4.2 6.6 6.7  HGB 13.3 10.2* 9.8* 9.7*  HCT 42.0 32.1* 31.1* 30.9*  MCV 92.7 94.4 95.1 96.0  PLT 5* 11* 39* 5*   Basic Metabolic Panel: Recent Labs  Lab 09/07/18 1139 09/08/18 0639 09/09/18 0700 09/10/18 0622  NA 143 143 144 147*  K 3.2* 3.7 3.1* 4.0  CL 101 106 107 113*  CO2 _0 GLUCOSE 132* 116* 121* 121*  BUN 22 54* 61* 51*  CREATININE 1.54* 1.50* 1.45* 1.29*  CALCIUM 10.8* 10.0 10.5* 10.4*  MG  --   --   --  2.2   GFR: CrCl cannot be calculated (Unknown ideal weight.). Liver Function Tests: Recent Labs  Lab 09/07/18 1139  AST 112*  ALT 50*   ALKPHOS 140*  BILITOT 1.2  PROT 6.5  ALBUMIN 3.6   No results for input(s): LIPASE, AMYLASE in the last 168 hours. No results for input(s): AMMONIA in the last 168 hours. Coagulation Profile: Recent Labs  Lab 09/07/18 1139 09/10/18 0622  INR 1.0 1.1   Cardiac Enzymes: No results for input(s): CKTOTAL, CKMB, CKMBINDEX, TROPONINI in the last 168 hours. BNP (last 3 results) No results for input(s): PROBNP in the last 8760 hours. HbA1C: No results for input(s): HGBA1C in the last 72 hours. CBG: No results for input(s): GLUCAP in the last 168 hours. Lipid Profile: No results for input(s): CHOL, HDL, LDLCALC, TRIG, CHOLHDL, LDLDIRECT in the last 72 hours. Thyroid Function Tests: No results for input(s): TSH, T4TOTAL, FREET4, T3FREE, THYROIDAB in the last 72 hours. Anemia Panel: No results for input(s): VITAMINB12, FOLATE, FERRITIN, TIBC,  IRON, RETICCTPCT in the last 72 hours. Sepsis Labs: No results for input(s): PROCALCITON, LATICACIDVEN in the last 168 hours.  Recent Results (from the past 240 hour(s))  SARS Coronavirus 2 [facility] order, Performed in [facility] hospital lab) Nasopharyngeal Nasopharyngeal Swab     Status: None   Collection Time: 09/07/18  1:08 PM   Specimen: Nasopharyngeal Swab  Result Value Ref Range Status   SARS Coronavirus 2 NEGATIVE NEGATIVE Final    Comment: (NOTE) If result is NEGATIVE SARS-CoV-2 target nucleic acids are NOT DETECTED. The SARS-CoV-2 RNA is generally detectable in upper and lower  respiratory specimens during the acute phase of infection. The lowest  concentration of SARS-CoV-2 viral copies this assay can detect is 250  copies / mL. A negative result does not preclude SARS-CoV-2 infection  and should not be used as the sole basis for treatment or other  patient management decisions.  A negative result may occur with  improper specimen collection / handling, submission of specimen other  than nasopharyngeal swab, presence of viral  mutation(s) within the  areas targeted by this assay, and inadequate number of viral copies  (<250 copies / mL). A negative result must be combined with clinical  observations, patient history, and epidemiological information. If result is POSITIVE SARS-CoV-2 target nucleic acids are DETECTED. The SARS-CoV-2 RNA is generally detectable in upper and lower  respiratory specimens dur ing the acute phase of infection.  Positive  results are indicative of active infection with SARS-CoV-2.  Clinical  correlation with patient history and other diagnostic information is  necessary to determine patient infection status.  Positive results do  not rule out bacterial infection or co-infection with other viruses. If result is PRESUMPTIVE POSTIVE SARS-CoV-2 nucleic acids MAY BE PRESENT.   A presumptive positive result was obtained on the submitted specimen  and confirmed on repeat testing.  While 2019 novel coronavirus  (SARS-CoV-2) nucleic acids may be present in the submitted sample  additional confirmatory testing may be necessary for epidemiological  and / or clinical management purposes  to differentiate between  SARS-CoV-2 and other Sarbecovirus currently known to infect humans.  If clinically indicated additional testing with an alternate test  methodology [phone]) is advised. The SARS-CoV-2 RNA is generally  detectable in upper and lower respiratory sp ecimens during the acute  phase of infection. The expected result is Negative. Fact Sheet for Patients:  StrictlyIdeas.no Fact Sheet for Healthcare Providers: BankingDealers.co.za This test is not yet approved or cleared by the [place] FDA and has been authorized for detection and/or diagnosis of SARS-CoV-2 by FDA under an Emergency Use Authorization (EUA).  This EUA will remain in effect (meaning this test can be used) for the duration of the COVID-19 declaration under Section 564(b)(1)  of the Act, 21 U.S.C. section 360bbb-3(b)(1), unless the authorization is terminated or revoked sooner. Performed at [facility], [place] [address] [place], [place] [address]   Culture, blood (routine x 2)     Status: None (Preliminary result)   Collection Time: 09/07/18  3:50 PM   Specimen: BLOOD LEFT FOREARM  Result Value Ref Range Status   Specimen Description   Final    BLOOD LEFT FOREARM Performed at [place] [address], [place], [place] [address]    Special Requests   Final    BOTTLES DRAWN AEROBIC AND ANAEROBIC Blood Culture adequate volume Performed at [place] [address] [place],  [address]    Culture  Final    NO GROWTH 3 DAYS Performed at [facility], [place] [address] [place], [place] [address]    Report Status PENDING  Incomplete  Culture, blood (routine x 2)     Status: None (Preliminary result)   Collection Time: 09/07/18  3:52 PM   Specimen: BLOOD RIGHT HAND  Result Value Ref Range Status   Specimen Description   Final    BLOOD RIGHT HAND Performed at [place] [address], [place], [place] [address]    Special Requests   Final    BOTTLES DRAWN AEROBIC AND ANAEROBIC Blood Culture adequate volume Performed at [place] [address], [place], [place] [address]    Culture   Final    NO GROWTH 3 DAYS Performed at [facility], [place] [address], [place], [place] [address]    Report Status PENDING  Incomplete  MRSA PCR Screening     Status: None   Collection Time: 09/07/18  6:45 PM   Specimen: Nasal Mucosa; Nasopharyngeal  Result Value Ref Range Status   MRSA by PCR NEGATIVE NEGATIVE Final    Comment:        The GeneXpert MRSA Assay (FDA approved for NASAL specimens only), is one component of a comprehensive MRSA colonization surveillance program. It is not intended to diagnose MRSA infection nor to guide or  monitor treatment for MRSA infections. Performed at [facility], [place] [address], [place], [place] [address]       Radiology Studies: No results found.    Scheduled Meds: . carvedilol  12.5 mg Oral BID WC  . finasteride  5 mg Oral QPM  . fluticasone furoate-vilanterol  1 puff Inhalation Daily   And  . umeclidinium bromide  1 puff Inhalation Daily  . loratadine  10 mg Oral Daily  . predniSONE  60 mg Oral Q breakfast  . rosuvastatin  40 mg Oral Daily   Continuous Infusions: . ceFEPime (MAXIPIME) IV 2 g (09/10/18 1012)     LOS: 3 days      Time spent: 25 minutes   Dessa Phi, DO [facility] [email or web address] 09/10/2018, 11:49 AM

## 2018-09-10 NOTE — Progress Notes (Addendum)
IP PROGRESS NOTE  Subjective:   Mr. Alpert complains of insomnia.  No bleeding.  Dyspnea has improved.  Objective: Vital signs in last 24 hours: Blood pressure 134/69, pulse 80, temperature 98.3 F (36.8 C), temperature source Oral, resp. rate 16, SpO2 94 %.  Intake/Output from previous day: 08/02 0701 - 08/03 0700 In: 1172.7 [P.O.:960; IV Piggyback:212.7] Out: 1100 [Urine:1100]  Physical Exam:  HEENT: Mouth without bleeding or thrush Abdomen: Nontender, no hepatosplenomegaly Extremities: No leg edema Multiple ecchymoses over the extremities, petechiae of the lower legs  Portacath/PICC-without erythema  Lab Results: Recent Labs    09/08/18 2016 09/10/18 0622  WBC 8.4 8.8  HGB 9.8* 9.7*  HCT 31.1* 30.9*  PLT 39* 5*    BMET Recent Labs    09/09/18 0700 09/10/18 0622  NA 144 147*  K 3.1* 4.0  CL 107 113*  CO2 26 25  GLUCOSE 121* 121*  BUN 61* 51*  CREATININE 1.45* 1.29*  CALCIUM 10.5* 10.4*    No results found for: CEA1  Studies/Results: No results found.  Medications: I have reviewed the patient's current medications.  Assessment/Plan: 1.Extensive stage small cell lung cancer  CT chest 03/05/2018-left hilar mass, small mediastinal lymph nodes, atelectasis versus consolidation versus mass in the superior segment of the left lower lobe  Bone marrow biopsy 03/07/2018-extensive involvement of the bone marrow with metastatic small cell carcinoma consistent with a lung primary, small foci of non-small cell differentiation  Cycle 1 etoposide/carboplatin 03/09/2018, G-CSF started 03/12/2018 and discontinued 03/20/2018  CT head 03/21/2018-no evidence of metastatic disease  Cycle 2 etoposide/carboplatin, dose escalation of etoposide and carboplatin 04/03/2018  Cycle 3 etoposide/carboplatin with addition of atezolizumab 04/30/2018  Cycle 4 etoposide/carboplatin/atezolizumab 05/28/2018  Restaging chest CT 06/20/2018-left hilar nodal mass no longer  visualized/measurable. Subpleural mass within the superior segment left lower lobe has decreased in size. No new or progressive findings.  Every 3-week atezolizumab beginning 06/25/2018 2. Thrombocytopenia secondary to #1 at presentation 3.COPD 4.History of coronary artery disease 5.CHF 6.BPH 7.Admission with pneumonia December 2019 8.Mild renal insufficiency 9.Elevated liver enzymes 10. History of coagulopathy 11. History of mild elevation of the calcium level, persistent mild elevation likely related to small cell carcinoma  12. Anemia/leukopenia secondary to small cell lung cancer and chemotherapy-improved 13. Hypokalemia-likely secondary to furosemide therapy, potassium supplement started 04/03/2018 14.Admission 04/19/2018 with left lung pneumonia 15.  Admission 09/07/2018 with dyspnea and thrombocytopenia   Mr. Markie appears unchanged.  He has persistent severe thrombocytopenia.  I suspect the thrombocytopenia is secondary to progression of the of stage small cell lung cancer.  He is scheduled for a diagnostic bone marrow biopsy today. Mr. Osment understands the chance of a response to chemotherapy is low if progressive small cell is confirmed.  We discussed options of salvage systemic therapy and comfort care.  I will follow-up on the bone marrow result and discuss options with Mr.Pindell and his wife.  Recommendations: 1.  Proceed with diagnostic bone marrow biopsy 2.  Transfuse platelets today 3.  Continue prednisone 4.  Daily CBC  LOS: 3 days   [name], MD   09/10/2018, 2:08 PM

## 2018-09-11 ENCOUNTER — Inpatient Hospital Stay (HOSPITAL_COMMUNITY): Payer: Medicare PPO

## 2018-09-11 LAB — CBC WITH DIFFERENTIAL/PLATELET
Abs Immature Granulocytes: 0.12 10*3/uL — ABNORMAL HIGH (ref 0.00–0.07)
Basophils Absolute: 0 10*3/uL (ref 0.0–0.1)
Basophils Relative: 0 %
Eosinophils Absolute: 0 10*3/uL (ref 0.0–0.5)
Eosinophils Relative: 0 %
HCT: 24.9 % — ABNORMAL LOW (ref 39.0–52.0)
Hemoglobin: 7.7 g/dL — ABNORMAL LOW (ref 13.0–17.0)
Immature Granulocytes: 1 %
Lymphocytes Relative: 17 %
Lymphs Abs: 1.5 10*3/uL (ref 0.7–4.0)
MCH: 29.6 pg (ref 26.0–34.0)
MCHC: 30.9 g/dL (ref 30.0–36.0)
MCV: 95.8 fL (ref 80.0–100.0)
Monocytes Absolute: 0.8 10*3/uL (ref 0.1–1.0)
Monocytes Relative: 9 %
Neutro Abs: 6.1 10*3/uL (ref 1.7–7.7)
Neutrophils Relative %: 73 %
Platelets: 8 10*3/uL — CL (ref 150–400)
RBC: 2.6 MIL/uL — ABNORMAL LOW (ref 4.22–5.81)
RDW: 13.9 % (ref 11.5–15.5)
WBC: 8.5 10*3/uL (ref 4.0–10.5)
nRBC: 0.4 % — ABNORMAL HIGH (ref 0.0–0.2)

## 2018-09-11 LAB — BASIC METABOLIC PANEL
Anion gap: 8 (ref 5–15)
BUN: 61 mg/dL — ABNORMAL HIGH (ref 8–23)
CO2: 25 mmol/L (ref 22–32)
Calcium: 10.3 mg/dL (ref 8.9–10.3)
Chloride: 114 mmol/L — ABNORMAL HIGH (ref 98–111)
Creatinine, Ser: 1.3 mg/dL — ABNORMAL HIGH (ref 0.61–1.24)
GFR calc Af Amer: 58 mL/min — ABNORMAL LOW (ref >60)
GFR calc non Af Amer: 50 mL/min — ABNORMAL LOW (ref >60)
Glucose, Bld: 145 mg/dL — ABNORMAL HIGH (ref 70–99)
Potassium: 3.7 mmol/L (ref 3.5–5.1)
Sodium: 147 mmol/L — ABNORMAL HIGH (ref 135–145)

## 2018-09-11 LAB — PREPARE PLATELET PHERESIS: Unit division: 0

## 2018-09-11 LAB — BPAM PLATELET PHERESIS
Blood Product Expiration Date: 202008042359
ISSUE DATE / TIME: 202008031208
Unit Type and Rh: 5100

## 2018-09-11 LAB — HEMOGLOBIN AND HEMATOCRIT, BLOOD
HCT: 28.2 % — ABNORMAL LOW (ref 39.0–52.0)
Hemoglobin: 8.8 g/dL — ABNORMAL LOW (ref 13.0–17.0)

## 2018-09-11 MED ORDER — SODIUM CHLORIDE 0.9% IV SOLUTION: Freq: Once | INTRAVENOUS | Status: DC

## 2018-09-11 MED ORDER — MIDAZOLAM HCL 2 MG/2ML IJ SOLN
INTRAMUSCULAR | Status: AC
  Filled 2018-09-11: qty 4

## 2018-09-11 MED ORDER — LIDOCAINE HCL (PF) 1 % IJ SOLN
INTRAMUSCULAR | Status: AC | PRN
  Administered 2018-09-11: 10 mL

## 2018-09-11 MED ORDER — FENTANYL CITRATE (PF) 100 MCG/2ML IJ SOLN
INTRAMUSCULAR | Status: AC
  Filled 2018-09-11: qty 4

## 2018-09-11 MED ORDER — PREDNISONE 20 MG PO TABS
40.0000 mg | ORAL_TABLET | Freq: Every day | ORAL | Status: DC
  Administered 2018-09-12: 40 mg via ORAL
  Filled 2018-09-11: qty 2

## 2018-09-11 MED ORDER — NALOXONE HCL 0.4 MG/ML IJ SOLN
INTRAMUSCULAR | Status: AC
  Filled 2018-09-11: qty 1

## 2018-09-11 MED ORDER — SODIUM CHLORIDE 0.9 % IV SOLN
INTRAVENOUS | Status: AC
  Filled 2018-09-11: qty 250

## 2018-09-11 MED ORDER — LOSARTAN POTASSIUM 50 MG PO TABS
25.0000 mg | ORAL_TABLET | Freq: Every day | ORAL | Status: DC
  Administered 2018-09-11 – 2018-09-12 (×2): 25 mg via ORAL
  Filled 2018-09-11 (×2): qty 1

## 2018-09-11 MED ORDER — MIDAZOLAM HCL 2 MG/2ML IJ SOLN
INTRAMUSCULAR | Status: AC | PRN
  Administered 2018-09-11 (×2): 1 mg via INTRAVENOUS

## 2018-09-11 MED ORDER — FLUMAZENIL 0.5 MG/5ML IV SOLN
INTRAVENOUS | Status: AC
  Filled 2018-09-11: qty 5

## 2018-09-11 MED ORDER — FENTANYL CITRATE (PF) 100 MCG/2ML IJ SOLN
INTRAMUSCULAR | Status: AC | PRN
  Administered 2018-09-11 (×2): 50 ug via INTRAVENOUS

## 2018-09-11 NOTE — Progress Notes (Signed)
PROGRESS NOTE    Joseph Berg  SXQ:[id number] DOB: 11-29-1933 DOA: 09/07/2018 PCP: [name], MD     Brief Narrative:  Joseph Berg is a 85 y.o. male with medical history significant of CAD, chronic systolic heart failure, small cell lung cancer followed by Dr. Benay Spice, COPD, BPH who presents to the hospital with chief complaint of shortness of breath.  His wife is at bedside who is able to provide history.  She states that he started to complain of shortness of breath Wednesday evening.  Per their home pulse ox monitor, his oxygen saturation was in the 80s.  She contacted the cancer center today who recommended the patient be evaluated in the emergency department.  He also admits to a productive yellow sputum 2 to 3 days prior to shortness of breath.  Denies any fevers, chest pain, abdominal pain, nausea, vomiting.  Does admit to some mild peripheral edema.  He denies any blood loss, no epistaxis, blood in sputum, blood in stool. In the emergency department, patient was found to be hypoxic with pulse ox in the 80s on room air and improved to mid 90s with 2 L nasal cannula O2.  Labs show a creatinine 1.54, potassium 3.2, BNP 68, platelets 5.  SARS-CoV-2 negative.  CTA chest negative for PE but did reveal left lower lobe nodular density concerning for mucoid impaction versus pneumonia versus tumor.  Also showed enlarged left hilar lymph node.  Oncology consulted for thrombocytopenia.  New events last 24 hours / Subjective: No new complaints today.  Denies any blood loss, no shortness of breath.  Assessment & Plan:   Principal Problem:   Thrombocytopenia (HCC) Active Problems:   HYPERTENSION, BENIGN ESSENTIAL   BPH (benign prostatic hyperplasia)   CAD (coronary artery disease) of artery bypass graft   Chronic systolic congestive heart failure (HCC)   Acute hypoxemic respiratory failure (HCC)   Healthcare-associated pneumonia   Small cell lung cancer (HCC)  Hypokalemia   CKD (chronic kidney disease) stage 3, GFR 30-59 ml/min (HCC)    Thrombocytopenia -No signs of bleeding currently -Oncology consulted. Likely etiology due to progression of small cell carcinoma vs ITP vs atezolizumab.  Status post bone marrow biopsy 8/4 -Transfuse for platelets less than 15,000 -Prednisone -Transfusion ordered today  Acute hypoxemic respiratory failure -Secondary to left lower lobe pneumonia  -SARS-CoV-2 negative -Nasal cannula O2 to maintain sats greater than 88% -Wean O2, home desat screen   Left lower lobe pneumonia, HCAP  -Sepsis ruled out, pneumonia POA  -MRSA PCR negative, stop vancomycin -Continue cefepime  Small cell lung cancer -Follows with Dr. Benay Spice  CKD stage III -Baseline creatinine 1.3 -Creatinine stable  Chronic systolic heart failure -Without acute exacerbation at this time.  BNP 68  Essential hypertension -Resume Coreg, Lasix, Cozaar  Hyperlipidemia -Continue Crestor         DVT prophylaxis: SCD Code Status: DNR Family Communication: None  Disposition Plan: Pending further improvement, stabilization.  Bone marrow biopsy results pending   Consultants:   Oncology  Procedures:   None  Antimicrobials:  Anti-infectives (From admission, onward)   Start     Dose/Rate Route Frequency Ordered Stop   09/08/18 1500  vancomycin (VANCOCIN) IVPB 750 mg/150 ml premix  Status:  Discontinued     750 mg 150 mL/hr over 60 Minutes Intravenous Every 24 hours 09/07/18 1705 09/09/18 1030   09/08/18 0600  ceFEPIme (MAXIPIME) 2 g in sodium chloride 0.9 % 100 mL IVPB     2 g  200 mL/hr over 30 Minutes Intravenous Every 12 hours 09/07/18 1702     09/07/18 1530  ceFEPIme (MAXIPIME) 2 g in sodium chloride 0.9 % 100 mL IVPB     2 g 200 mL/hr over 30 Minutes Intravenous  Once 09/07/18 1519 09/07/18 1638   09/07/18 1530  vancomycin (VANCOCIN) 2,000 mg in sodium chloride 0.9 % 500 mL IVPB     2,000 mg 250 mL/hr over 120  Minutes Intravenous  Once 09/07/18 1519 09/07/18 1837       Objective: Vitals:   09/11/18 0704 09/11/18 1013 09/11/18 1025 09/11/18 1030  BP:  100/65 (!) 134/111 118/63  Pulse:  86 83 83  Resp:  _0 Temp:      TempSrc:      SpO2: 96% 98% 97% 96%    Intake/Output Summary (Last 24 hours) at 09/11/2018 1134 Last data filed at 09/10/2018 1941 Gross per 24 hour  Intake 640 ml  Output 0 ml  Net 640 ml   There were no vitals filed for this visit.   Examination: General exam: Appears calm and comfortable  Respiratory system: Clear to auscultation. Respiratory effort normal. Cardiovascular system: S1 & S2 heard, RRR. No JVD, murmurs, rubs, gallops or clicks. No pedal edema. Gastrointestinal system: Abdomen is nondistended, soft and nontender. No organomegaly or masses felt. Normal bowel sounds heard. Central nervous system: Alert and oriented. No focal neurological deficits. Extremities: Symmetric 5 x 5 power. Skin: No rashes, lesions or ulcers Psychiatry: Judgement and insight appear normal. Mood & affect appropriate.    Data Reviewed: I have personally reviewed following labs and imaging studies  CBC: Recent Labs  Lab 09/08/18 0639 09/08/18 2016 09/10/18 0622 09/10/18 1636 09/11/18 0521  WBC 6.7 8.4 8.8 11.5* 8.5  NEUTROABS 4.2 6.6 6.7 9.5* 6.1  HGB 10.2* 9.8* 9.7* 9.8* 7.7*  HCT 32.1* 31.1* 30.9* 30.6* 24.9*  MCV 94.4 95.1 96.0 96.8 95.8  PLT 11* 39* 5* 17* 8*   Basic Metabolic Panel: Recent Labs  Lab 09/07/18 1139 09/08/18 0639 09/09/18 0700 09/10/18 0622 09/11/18 0521  NA 143 143 144 147* 147*  K 3.2* 3.7 3.1* 4.0 3.7  CL 101 106 107 113* 114*  CO2 _1 GLUCOSE 132* 116* 121* 121* 145*  BUN 22 54* 61* 51* 61*  CREATININE 1.54* 1.50* 1.45* 1.29* 1.30*  CALCIUM 10.8* 10.0 10.5* 10.4* 10.3  MG  --   --   --  2.2  --    GFR: CrCl cannot be calculated (Unknown ideal weight.). Liver Function Tests: Recent Labs  Lab 09/07/18 1139  AST  112*  ALT 50*  ALKPHOS 140*  BILITOT 1.2  PROT 6.5  ALBUMIN 3.6   No results for input(s): LIPASE, AMYLASE in the last 168 hours. No results for input(s): AMMONIA in the last 168 hours. Coagulation Profile: Recent Labs  Lab 09/07/18 1139 09/10/18 0622  INR 1.0 1.1   Cardiac Enzymes: No results for input(s): CKTOTAL, CKMB, CKMBINDEX, TROPONINI in the last 168 hours. BNP (last 3 results) No results for input(s): PROBNP in the last 8760 hours. HbA1C: No results for input(s): HGBA1C in the last 72 hours. CBG: No results for input(s): GLUCAP in the last 168 hours. Lipid Profile: No results for input(s): CHOL, HDL, LDLCALC, TRIG, CHOLHDL, LDLDIRECT in the last 72 hours. Thyroid Function Tests: No results for input(s): TSH, T4TOTAL, FREET4, T3FREE, THYROIDAB in the last 72 hours. Anemia Panel: No results for input(s): VITAMINB12, FOLATE, FERRITIN, TIBC,  IRON, RETICCTPCT in the last 72 hours. Sepsis Labs: No results for input(s): PROCALCITON, LATICACIDVEN in the last 168 hours.  Recent Results (from the past 240 hour(s))  SARS Coronavirus 2 [facility] order, Performed in [facility] hospital lab) Nasopharyngeal Nasopharyngeal Swab     Status: None   Collection Time: 09/07/18  1:08 PM   Specimen: Nasopharyngeal Swab  Result Value Ref Range Status   SARS Coronavirus 2 NEGATIVE NEGATIVE Final    Comment: (NOTE) If result is NEGATIVE SARS-CoV-2 target nucleic acids are NOT DETECTED. The SARS-CoV-2 RNA is generally detectable in upper and lower  respiratory specimens during the acute phase of infection. The lowest  concentration of SARS-CoV-2 viral copies this assay can detect is 250  copies / mL. A negative result does not preclude SARS-CoV-2 infection  and should not be used as the sole basis for treatment or other  patient management decisions.  A negative result may occur with  improper specimen collection / handling, submission of specimen other  than nasopharyngeal swab,  presence of viral mutation(s) within the  areas targeted by this assay, and inadequate number of viral copies  (<250 copies / mL). A negative result must be combined with clinical  observations, patient history, and epidemiological information. If result is POSITIVE SARS-CoV-2 target nucleic acids are DETECTED. The SARS-CoV-2 RNA is generally detectable in upper and lower  respiratory specimens dur ing the acute phase of infection.  Positive  results are indicative of active infection with SARS-CoV-2.  Clinical  correlation with patient history and other diagnostic information is  necessary to determine patient infection status.  Positive results do  not rule out bacterial infection or co-infection with other viruses. If result is PRESUMPTIVE POSTIVE SARS-CoV-2 nucleic acids MAY BE PRESENT.   A presumptive positive result was obtained on the submitted specimen  and confirmed on repeat testing.  While 2019 novel coronavirus  (SARS-CoV-2) nucleic acids may be present in the submitted sample  additional confirmatory testing may be necessary for epidemiological  and / or clinical management purposes  to differentiate between  SARS-CoV-2 and other Sarbecovirus currently known to infect humans.  If clinically indicated additional testing with an alternate test  methodology [id number]) is advised. The SARS-CoV-2 RNA is generally  detectable in upper and lower respiratory sp ecimens during the acute  phase of infection. The expected result is Negative. Fact Sheet for Patients:  StrictlyIdeas.no Fact Sheet for Healthcare Providers: BankingDealers.co.za This test is not yet approved or cleared by the [place] FDA and has been authorized for detection and/or diagnosis of SARS-CoV-2 by FDA under an Emergency Use Authorization (EUA).  This EUA will remain in effect (meaning this test can be used) for the duration of the COVID-19 declaration under  Section 564(b)(1) of the Act, 21 U.S.C. section 360bbb-3(b)(1), unless the authorization is terminated or revoked sooner. Performed at [facility], [place] [address], [place], [place] [address]   Culture, blood (routine x 2)     Status: None (Preliminary result)   Collection Time: 09/07/18  3:50 PM   Specimen: BLOOD LEFT FOREARM  Result Value Ref Range Status   Specimen Description   Final    BLOOD LEFT FOREARM Performed at [facility] [address] [place], [place] [address]    Special Requests   Final    BOTTLES DRAWN AEROBIC AND ANAEROBIC Blood Culture adequate volume Performed at [facility] [address], [place], [place] [address]    Culture  Final    NO GROWTH 4 DAYS Performed at [facility], [place] [address], [place], [place] [address]    Report Status PENDING  Incomplete  Culture, blood (routine x 2)     Status: None (Preliminary result)   Collection Time: 09/07/18  3:52 PM   Specimen: BLOOD RIGHT HAND  Result Value Ref Range Status   Specimen Description   Final    BLOOD RIGHT HAND Performed at [facility] [address] [place], [place] [address]    Special Requests   Final    BOTTLES DRAWN AEROBIC AND ANAEROBIC Blood Culture adequate volume Performed at [facility] [address] [place], [place] [address]    Culture   Final    NO GROWTH 4 DAYS Performed at [facility], [place] [address], [place], [place] [address]    Report Status PENDING  Incomplete  MRSA PCR Screening     Status: None   Collection Time: 09/07/18  6:45 PM   Specimen: Nasal Mucosa; Nasopharyngeal  Result Value Ref Range Status   MRSA by PCR NEGATIVE NEGATIVE Final    Comment:        The GeneXpert MRSA Assay (FDA approved for NASAL specimens only), is one component of a comprehensive MRSA colonization surveillance program. It is not intended to diagnose MRSA infection nor  to guide or monitor treatment for MRSA infections. Performed at [facility], [place] [address] [place], [place] [address]       Radiology Studies: Ct Biopsy  Result Date: 09/25/2018 INDICATION: Chronic thrombocytopenia EXAM: CT GUIDED RIGHT ILIAC BONE MARROW ASPIRATION AND CORE BIOPSY Date:  2018-09-25 09-25-18 11:11 am Radiologist:  M. Daryll Brod, MD Guidance:  CT FLUOROSCOPY TIME:  Fluoroscopy Time: NONE. MEDICATIONS: 1% lidocaine local ANESTHESIA/SEDATION: 2.0 mg IV Versed; 100 mcg IV Fentanyl Moderate Sedation Time:  10 minutes The patient was continuously monitored during the procedure by the interventional radiology nurse under my direct supervision. CONTRAST:  None. COMPLICATIONS: None PROCEDURE: Informed consent was obtained from the patient following explanation of the procedure, risks, benefits and alternatives. The patient understands, agrees and consents for the procedure. All questions were addressed. A time out was performed. The patient was positioned prone and non-contrast localization CT was performed of the pelvis to demonstrate the iliac marrow spaces. Maximal barrier sterile technique utilized including caps, mask, sterile gowns, sterile gloves, large sterile drape, hand hygiene, and Betadine prep. Under sterile conditions and local anesthesia, an 11 gauge coaxial bone biopsy needle was advanced into the right iliac marrow space. Needle position was confirmed with CT imaging. Initially, bone marrow aspiration was performed. Next, the 11 gauge outer cannula was utilized to obtain a right iliac bone marrow core biopsy. Needle was removed. Hemostasis was obtained with compression. The patient tolerated the procedure well. Samples were prepared with the cytotechnologist. No immediate complications. IMPRESSION: CT guided right iliac bone marrow aspiration and core biopsy. Electronically Signed   By: Jerilynn Mages.  Shick M.D.   On: 25-Sep-2018 11:14   Ct Bone Marrow Biopsy &  Aspiration  Result Date: 25-Sep-2018 INDICATION: Chronic thrombocytopenia EXAM: CT GUIDED RIGHT ILIAC BONE MARROW ASPIRATION AND CORE BIOPSY Date:  09-25-18 09/25/2018 11:11 am Radiologist:  M. Daryll Brod, MD Guidance:  CT FLUOROSCOPY TIME:  Fluoroscopy Time: NONE. MEDICATIONS: 1% lidocaine local ANESTHESIA/SEDATION: 2.0 mg IV Versed; 100 mcg IV Fentanyl Moderate Sedation Time:  10 minutes The patient was continuously monitored during the procedure by the interventional radiology  nurse under my direct supervision. CONTRAST:  None. COMPLICATIONS: None PROCEDURE: Informed consent was obtained from the patient following explanation of the procedure, risks, benefits and alternatives. The patient understands, agrees and consents for the procedure. All questions were addressed. A time out was performed. The patient was positioned prone and non-contrast localization CT was performed of the pelvis to demonstrate the iliac marrow spaces. Maximal barrier sterile technique utilized including caps, mask, sterile gowns, sterile gloves, large sterile drape, hand hygiene, and Betadine prep. Under sterile conditions and local anesthesia, an 11 gauge coaxial bone biopsy needle was advanced into the right iliac marrow space. Needle position was confirmed with CT imaging. Initially, bone marrow aspiration was performed. Next, the 11 gauge outer cannula was utilized to obtain a right iliac bone marrow core biopsy. Needle was removed. Hemostasis was obtained with compression. The patient tolerated the procedure well. Samples were prepared with the cytotechnologist. No immediate complications. IMPRESSION: CT guided right iliac bone marrow aspiration and core biopsy. Electronically Signed   By: Jerilynn Mages.  Shick M.D.   On: 09/11/2018 11:14      Scheduled Meds:  sodium chloride   Intravenous Once   carvedilol  12.5 mg Oral BID WC   finasteride  5 mg Oral QPM   fluticasone furoate-vilanterol  1 puff Inhalation Daily   And    umeclidinium bromide  1 puff Inhalation Daily   furosemide  40 mg Oral Daily   loratadine  10 mg Oral Daily   predniSONE  60 mg Oral Q breakfast   rosuvastatin  40 mg Oral Daily   Continuous Infusions:  ceFEPime (MAXIPIME) IV 2 g (09/11/18 0927)   sodium chloride       LOS: 4 days      Time spent: 20 minutes   Dessa Phi, DO [facility] [email or web address] 09/11/2018, 11:34 AM

## 2018-09-11 NOTE — Procedures (Signed)
Thrombocytopenia  S/p CT BM asp and core bx  No comp Stable ebl min Path pending Full report in pacs

## 2018-09-11 NOTE — Progress Notes (Signed)
IP PROGRESS NOTE  Subjective:   Joseph Berg has no complaint this morning.  Objective: Vital signs in last 24 hours: Blood pressure 134/69, pulse 80, temperature 98.3 F (36.8 C), temperature source Oral, resp. rate 16, SpO2 94 %.  Intake/Output from previous day: 08/02 0701 - 08/03 0700 In: 1172.7 [P.O.:960; IV Piggyback:212.7] Out: 1100 [Urine:1100]  Physical Exam:  HEENT: Mouth without bleeding or thrush Abdomen: Nontender, no hepatosplenomegaly Extremities: No leg edema Multiple ecchymoses over the extremities, petechiae of the lower legs Neurologic: Alert, confused, follows simple commands    Lab Results: Recent Labs    09/08/18 2016 09/10/18 0622  WBC 8.4 8.8  HGB 9.8* 9.7*  HCT 31.1* 30.9*  PLT 39* 5*    BMET Recent Labs    09/09/18 0700 09/10/18 0622  NA 144 147*  K 3.1* 4.0  CL 107 113*  CO2 26 25  GLUCOSE 121* 121*  BUN 61* 51*  CREATININE 1.45* 1.29*  CALCIUM 10.5* 10.4*    No results found for: CEA1  Studies/Results: No results found.  Medications: I have reviewed the patient's current medications.  Assessment/Plan: 1.Extensive stage small cell lung cancer  CT chest 03/05/2018-left hilar mass, small mediastinal lymph nodes, atelectasis versus consolidation versus mass in the superior segment of the left lower lobe  Bone marrow biopsy 03/07/2018-extensive involvement of the bone marrow with metastatic small cell carcinoma consistent with a lung primary, small foci of non-small cell differentiation  Cycle 1 etoposide/carboplatin 03/09/2018, G-CSF started 03/12/2018 and discontinued 03/20/2018  CT head 03/21/2018-no evidence of metastatic disease  Cycle 2 etoposide/carboplatin, dose escalation of etoposide and carboplatin 04/03/2018  Cycle 3 etoposide/carboplatin with addition of atezolizumab 04/30/2018  Cycle 4 etoposide/carboplatin/atezolizumab 05/28/2018  Restaging chest CT 06/20/2018-left hilar nodal mass no longer visualized/measurable.  Subpleural mass within the superior segment left lower lobe has decreased in size. No new or progressive findings.  Every 3-week atezolizumab beginning 06/25/2018 2. Thrombocytopenia secondary to #1 at presentation 3.COPD 4.History of coronary artery disease 5.CHF 6.BPH 7.Admission with pneumonia December 2019 8.Mild renal insufficiency 9.Elevated liver enzymes 10. History of coagulopathy 11. History of mild elevation of the calcium level, persistent mild elevation likely related to small cell carcinoma  12. Anemia/leukopenia secondary to small cell lung cancer and chemotherapy-improved 13. Hypokalemia-likely secondary to furosemide therapy, potassium supplement started 04/03/2018 14.Admission 04/19/2018 with left lung pneumonia 15.  Admission 09/07/2018 with dyspnea and thrombocytopenia 16.  Altered mental status 09/11/2018   Joseph Berg has persistent severe thrombocytopenia.  He is confused today.  I suspect the confusion is related to hospital delirium, though prednisone may be contributing.  He will undergo a diagnostic bone marrow biopsy today. I will follow-up on the bone marrow biopsy result and discussed treatment options with Mr. Bingley and his wife.  Hospice care will be appropriate if metastatic small cell carcinoma is confirmed.  Recommendations: 1.  Proceed with diagnostic bone marrow biopsy 2.  Transfuse platelets today 3.  Continue prednisone, I will decrease the prednisone dose 4.  Daily CBC 5.  Follow-up bone marrow biopsy result on 09/12/2018 and decide on comfort care versus a trial of salvage systemic therapy if progressive small cell carcinoma confirmed  LOS: 3 days   [name], MD   09/10/2018, 2:08 PM

## 2018-09-12 ENCOUNTER — Other Ambulatory Visit (HOSPITAL_BASED_OUTPATIENT_CLINIC_OR_DEPARTMENT_OTHER): Payer: Medicare PPO

## 2018-09-12 DIAGNOSIS — N183 Chronic kidney disease, stage 3 (moderate): Secondary | ICD-10-CM

## 2018-09-12 DIAGNOSIS — N401 Enlarged prostate with lower urinary tract symptoms: Secondary | ICD-10-CM

## 2018-09-12 DIAGNOSIS — J189 Pneumonia, unspecified organism: Secondary | ICD-10-CM

## 2018-09-12 DIAGNOSIS — J9601 Acute respiratory failure with hypoxia: Secondary | ICD-10-CM

## 2018-09-12 DIAGNOSIS — I10 Essential (primary) hypertension: Secondary | ICD-10-CM

## 2018-09-12 DIAGNOSIS — I25700 Atherosclerosis of coronary artery bypass graft(s), unspecified, with unstable angina pectoris: Secondary | ICD-10-CM

## 2018-09-12 DIAGNOSIS — E876 Hypokalemia: Secondary | ICD-10-CM

## 2018-09-12 DIAGNOSIS — C3490 Malignant neoplasm of unspecified part of unspecified bronchus or lung: Secondary | ICD-10-CM

## 2018-09-12 DIAGNOSIS — I5022 Chronic systolic (congestive) heart failure: Secondary | ICD-10-CM

## 2018-09-12 DIAGNOSIS — J9621 Acute and chronic respiratory failure with hypoxia: Secondary | ICD-10-CM

## 2018-09-12 LAB — CBC
HCT: 26 % — ABNORMAL LOW (ref 39.0–52.0)
Hemoglobin: 8 g/dL — ABNORMAL LOW (ref 13.0–17.0)
MCH: 29.9 pg (ref 26.0–34.0)
MCHC: 30.8 g/dL (ref 30.0–36.0)
MCV: 97 fL (ref 80.0–100.0)
Platelets: 8 10*3/uL — CL (ref 150–400)
RBC: 2.68 MIL/uL — ABNORMAL LOW (ref 4.22–5.81)
RDW: 14.2 % (ref 11.5–15.5)
WBC: 10.4 10*3/uL (ref 4.0–10.5)
nRBC: 1.2 % — ABNORMAL HIGH (ref 0.0–0.2)

## 2018-09-12 LAB — BPAM PLATELET PHERESIS
Blood Product Expiration Date: 202008052359
ISSUE DATE / TIME: 202008041200
Unit Type and Rh: 7300

## 2018-09-12 LAB — PREPARE PLATELET PHERESIS: Unit division: 0

## 2018-09-12 LAB — CULTURE, BLOOD (ROUTINE X 2)
Culture: NO GROWTH
Culture: NO GROWTH
Special Requests: ADEQUATE
Special Requests: ADEQUATE

## 2018-09-12 MED ORDER — MORPHINE SULFATE (PF) 2 MG/ML IV SOLN
2.0000 mg | INTRAVENOUS | Status: DC | PRN
  Administered 2018-09-12 – 2018-09-13 (×4): 2 mg via INTRAVENOUS
  Administered 2018-09-13: 4 mg via INTRAVENOUS
  Administered 2018-09-14 (×2): 2 mg via INTRAVENOUS
  Filled 2018-09-12 (×7): qty 1
  Filled 2018-09-12: qty 2

## 2018-09-12 MED ORDER — SODIUM CHLORIDE 0.9% IV SOLUTION
Freq: Once | INTRAVENOUS | Status: AC
  Administered 2018-09-12: 13:00:00 via INTRAVENOUS

## 2018-09-12 MED ORDER — LORAZEPAM 2 MG/ML IJ SOLN
0.5000 mg | Freq: Four times a day (QID) | INTRAMUSCULAR | Status: DC | PRN
  Administered 2018-09-12 – 2018-09-13 (×2): 0.5 mg via INTRAVENOUS
  Filled 2018-09-12 (×2): qty 1

## 2018-09-12 MED ORDER — MORPHINE SULFATE 2 MG/ML IJ SOLN: 2.0000 mg | INTRAMUSCULAR | Status: DC | PRN

## 2018-09-12 NOTE — Progress Notes (Signed)
IP PROGRESS NOTE  Subjective:   Mr. complains of pain in his legs and chest.  Objective: Vital signs in last 24 hours: Blood pressure 134/69, pulse 80, temperature 98.3 F (36.8 C), temperature source Oral, resp. rate 16, SpO2 94 %.  Intake/Output from previous day: 08/02 0701 - 08/03 0700 In: 1172.7 [P.O.:960; IV Piggyback:212.7] Out: 1100 [Urine:1100]  Physical Exam:  HEENT: Mouth without bleeding or thrush Abdomen: Tender in the upper abdomen and lower anterior chest Extremities: No leg edema, tender at the lower legs bilaterally Multiple ecchymoses over the extremities Neurologic: Alert, confused, follows simple commands Skin: Bone marrow site without evidence of bleeding    Lab Results: Recent Labs    09/08/18 2016 09/10/18 0622  WBC 8.4 8.8  HGB 9.8* 9.7*  HCT 31.1* 30.9*  PLT 39* 5*    BMET Recent Labs    09/09/18 0700 09/10/18 0622  NA 144 147*  K 3.1* 4.0  CL 107 113*  CO2 26 25  GLUCOSE 121* 121*  BUN 61* 51*  CREATININE 1.45* 1.29*  CALCIUM 10.5* 10.4*    No results found for: CEA1  Studies/Results: No results found.  Medications: I have reviewed the patient's current medications.  Assessment/Plan: 1.Extensive stage small cell lung cancer  CT chest 03/05/2018-left hilar mass, small mediastinal lymph nodes, atelectasis versus consolidation versus mass in the superior segment of the left lower lobe  Bone marrow biopsy 03/07/2018-extensive involvement of the bone marrow with metastatic small cell carcinoma consistent with a lung primary, small foci of non-small cell differentiation  Cycle 1 etoposide/carboplatin 03/09/2018, G-CSF started 03/12/2018 and discontinued 03/20/2018  CT head 03/21/2018-no evidence of metastatic disease  Cycle 2 etoposide/carboplatin, dose escalation of etoposide and carboplatin 04/03/2018  Cycle 3 etoposide/carboplatin with addition of atezolizumab 04/30/2018  Cycle 4 etoposide/carboplatin/atezolizumab  05/28/2018  Restaging chest CT 06/20/2018-left hilar nodal mass no longer visualized/measurable. Subpleural mass within the superior segment left lower lobe has decreased in size. No new or progressive findings.  Every 3-week atezolizumab beginning 06/25/2018 2. Thrombocytopenia secondary to #1 at presentation 3.COPD 4.History of coronary artery disease 5.CHF 6.BPH 7.Admission with pneumonia December 2019 8.Mild renal insufficiency 9.Elevated liver enzymes 10. History of coagulopathy 11. History of mild elevation of the calcium level, persistent mild elevation likely related to small cell carcinoma  12. Anemia/leukopenia secondary to small cell lung cancer and chemotherapy-improved 13. Hypokalemia-likely secondary to furosemide therapy, potassium supplement started 04/03/2018 14.Admission 04/19/2018 with left lung pneumonia 15.  Admission 09/07/2018 with dyspnea and thrombocytopenia 16.  Altered mental status 09/11/2018   Joseph Berg has persistent severe thrombocytopenia.  He remains confused.  No report of active bleeding. I discussed the bone marrow pathology with Dr. Gari Crown.  Preliminary review reveals extensive involvement of the bone marrow with metastatic carcinoma consistent with small cell carcinoma.  I returned to see Joseph Berg at approximately 3:15 PM today.  His wife was present.  I contacted his son by telephone.  I explained the diagnosis of progressive small cell carcinoma and the poor prognosis.  There is a small chance of transient clinical improvement with salvage systemic chemotherapy.  I do not recommend chemotherapy with his poor performance status and severe thrombocytopenia.  I recommend comfort care and hospice.  Ms. Snowdon and Joseph Berg son are in agreement.  They understand his life expectancy is likely weeks.  I added morphine and Ativan for comfort.  I discontinued nonessential medications. Recommendations: 1.  No further platelet  transfusion 2.  Morphine and Ativan for comfort 3.  Discontinue prednisone 4.  Hospice referral for home care in [facility] versus residential hospice   LOS: 3 days   [name], MD   09/10/2018, 2:08 PM

## 2018-09-12 NOTE — Evaluation (Signed)
Physical Therapy Evaluation Patient Details Name: Joseph Berg MRN: [id number] DOB: May 01, 1933 Today's Date: 09/12/2018   History of Present Illness  85 y.o. male with medical history significant of CAD, chronic systolic heart failure, small cell lung cancer followed by Dr. Benay Spice, COPD, BPH who presents to the hospital with chief complaint of shortness of breath. Dx of thrombocytopenia, PNA  Clinical Impression  Pt admitted with above diagnosis. Pt currently with functional limitations due to the deficits listed below (see PT Problem List). +2 total assist for supine to sit, pt unable to stand with +2 total assist 2* B foot pain with weightbearing. Used Maxisky mechanical lift for bed to recliner transfer. Pt is oriented to self only and unable to follow commands. SNF recommended.  Pt will benefit from skilled PT to increase their independence and safety with mobility to allow discharge to the venue listed below.       Follow Up Recommendations SNF    Equipment Recommendations  Wheelchair (measurements PT);Wheelchair cushion (measurements PT);Hospital bed    Recommendations for Other Services       Precautions / Restrictions Precautions Precautions: Fall Restrictions Weight Bearing Restrictions: No      Mobility  Bed Mobility Overal bed mobility: Needs Assistance Bed Mobility: Supine to Sit     Supine to sit: Total assist;+2 for physical assistance;+2 for safety/equipment     General bed mobility comments: pt 10%, multimodal cues for technique  Transfers Overall transfer level: Needs assistance   Transfers: Sit to/from Stand Sit to Stand: +2 physical assistance;Total assist         General transfer comment: attempted sit to stand with +2 total assist with Stedy, unable to clear pt's buttocks from bed 2* B foot pain with weight bearing; used maxisky for bed to recliner transfer  Ambulation/Gait                Stairs            Wheelchair  Mobility    Modified Rankin (Stroke Patients Only)       Balance Overall balance assessment: Needs assistance Sitting-balance support: Feet supported Sitting balance-Leahy Scale: Fair                                       Pertinent Vitals/Pain Pain Assessment: Faces Faces Pain Scale: Hurts even more Pain Location: back Pain Descriptors / Indicators: Guarding;Grimacing Pain Intervention(s): Limited activity within patient's tolerance;Monitored during session;Premedicated before session;Repositioned    Home Living Family/patient expects to be discharged to:: Skilled nursing facility Living Arrangements: Spouse/significant other Available Help at Discharge: Family   Home Access: Stairs to enter   Technical brewer of Steps: 1          Prior Function Level of Independence: Independent with assistive device(s)         Comments: walked with a cane PTA, per wife     Hand Dominance        Extremity/Trunk Assessment   Upper Extremity Assessment Upper Extremity Assessment: Difficult to assess due to impaired cognition    Lower Extremity Assessment Lower Extremity Assessment: Difficult to assess due to impaired cognition    Cervical / Trunk Assessment Cervical / Trunk Assessment: Normal  Communication   Communication: No difficulties  Cognition Arousal/Alertness: Awake/alert Behavior During Therapy: Agitated;Restless Overall Cognitive Status: Impaired/Different from baseline Area of Impairment: Orientation;Memory;Following commands;Safety/judgement;Problem solving;Awareness  Orientation Level: Place;Time;Situation;Disoriented to   Memory: Decreased short-term memory Following Commands: Follows one step commands inconsistently Safety/Judgement: Decreased awareness of safety;Decreased awareness of deficits   Problem Solving: Slow processing;Decreased initiation;Difficulty sequencing;Requires verbal cues;Requires  tactile cues        General Comments General comments (skin integrity, edema, etc.): SaO2 88% on room air at rest, applied 2L O2, SaO2 up to 97%    Exercises     Assessment/Plan    PT Assessment Patient needs continued PT services  PT Problem List Decreased strength;Decreased mobility;Decreased activity tolerance;Decreased balance;Decreased cognition       PT Treatment Interventions DME instruction;Gait training;Functional mobility training;Balance training;Therapeutic exercise;Patient/family education;Therapeutic activities    PT Goals (Current goals can be found in the Care Plan section)  Acute Rehab PT Goals Patient Stated Goal: to be able to get out of bed PT Goal Formulation: With family Time For Goal Achievement: 09/26/18 Potential to Achieve Goals: Good    Frequency Min 2X/week   Barriers to discharge        Co-evaluation               AM-PAC PT "6 Clicks" Mobility  Outcome Measure Help needed turning from your back to your side while in a flat bed without using bedrails?: Total Help needed moving from lying on your back to sitting on the side of a flat bed without using bedrails?: Total Help needed moving to and from a bed to a chair (including a wheelchair)?: Total Help needed standing up from a chair using your arms (e.g., wheelchair or bedside chair)?: Total Help needed to walk in hospital room?: Total Help needed climbing 3-5 steps with a railing? : Total 6 Click Score: 6    End of Session Equipment Utilized During Treatment: Gait belt Activity Tolerance: Patient limited by pain Patient left: in chair;with call bell/phone within reach;with family/visitor present;with chair alarm set Nurse Communication: Mobility status;Need for lift equipment PT Visit Diagnosis: Difficulty in walking, not elsewhere classified (R26.2);Adult, failure to thrive (R62.7);Pain    Time: 8003-4917 PT Time Calculation (min) (ACUTE ONLY): 25 min   Charges:   PT  Evaluation $PT Eval Moderate Complexity: 1 Mod PT Treatments $Therapeutic Activity: 8-22 mins       Blondell Reveal Kistler PT 09/12/2018  Acute Rehabilitation Services Pager [phone] Office [phone]

## 2018-09-12 NOTE — Progress Notes (Signed)
PROGRESS NOTE    Joseph Berg  CLE:[id number] DOB: 09/24/1933 DOA: 09/07/2018 PCP: [name], MD     Brief Narrative:  Joseph Berg is a 85 y.o. male with medical history significant of CAD, chronic systolic heart failure, small cell lung cancer followed by Dr. Benay Spice, COPD, BPH who presents to the hospital with chief complaint of shortness of breath.  His wife is at bedside who is able to provide history.  She states that he started to complain of shortness of breath Wednesday evening.  Per their home pulse ox monitor, his oxygen saturation was in the 80s.  She contacted the cancer center today who recommended the patient be evaluated in the emergency department.  He also admits to a productive yellow sputum 2 to 3 days prior to shortness of breath.  Denies any fevers, chest pain, abdominal pain, nausea, vomiting.  Does admit to some mild peripheral edema.  He denies any blood loss, no epistaxis, blood in sputum, blood in stool. In the emergency department, patient was found to be hypoxic with pulse ox in the 80s on room air and improved to mid 90s with 2 L nasal cannula O2.  Labs show a creatinine 1.54, potassium 3.2, BNP 68, platelets 5.  SARS-CoV-2 negative.  CTA chest negative for PE but did reveal left lower lobe nodular density concerning for mucoid impaction versus pneumonia versus tumor.  Also showed enlarged left hilar lymph node.  Oncology was consulted for thrombocytopenia.  Subjective: Patient denies interval complaints.  Denies any chest pain, palpitation. Denies overt shortness of breath.  Complains of bilateral toe pain.  Denies any obvious bleeding.   Assessment & Plan:   Principal Problem:   Thrombocytopenia (HCC) Active Problems:   HYPERTENSION, BENIGN ESSENTIAL   BPH (benign prostatic hyperplasia)   CAD (coronary artery disease) of artery bypass graft   Chronic systolic congestive heart failure (HCC)   Acute hypoxemic respiratory failure (HCC)  Healthcare-associated pneumonia   Small cell lung cancer (HCC)   Hypokalemia   CKD (chronic kidney disease) stage 3, GFR 30-59 ml/min (HCC)  Severe thrombocytopenia  -With petechiae and ecchymosis. Likely etiology due to progression of small cell carcinoma vs ITP vs atezolizumab.  Status post bone marrow biopsy 8/4 by interventional radiology.  Will follow report.  Transfuse for platelets less than 15,000.  Platelet today is 8000.  Will transfuse platelets today..  On Prednisone-managed by oncology.  Will follow oncology recommendation  Acute hypoxemic respiratory failure secondary left lower lobe pneumonia --SARS-CoV-2 negative, continue oxygen, nebulizer prednisone.  Continue to wean oxygen.  Currently on nasal cannula oxygen  Left lower lobe pneumonia- -no sepsis.  MRSA PCR was negative.  Off vancomycin.  Continue cefepime for now..   Extensive stage small cell lung cancer -Oncology on board.  Follow oncology recommendation.  CKD stage III -Baseline creatinine 1.3. Creatinine stable.  Will check BMP in a.m.  Chronic systolic heart failure.  Compensated at this time.  BNP of 68.  COPD.  Continue, oxygen nebulizer.  Might need oxygen on discharge.  Essential hypertension on Coreg, Lasix, Cozaar.  Blood pressure is stable.  Hyperlipidemia -Continue Crestor  Bilateral toe pain with hypersensitivity.  On local exam is no local ulceration.  Warm on palpation.  Capillary refill present.  Will monitor closely   DVT prophylaxis: SCD  Code Status: DNR  Family Communication: None   Disposition Plan: We will follow biopsy report.  Oncology will decide on further treatment plan. Will get PT evaluation.  Consultants:   Oncology  Interventional radiology  Procedures:   IR guided bone marrow biopsy on 09/11/2018  Antimicrobials:  Anti-infectives (From admission, onward)   Start     Dose/Rate Route Frequency Ordered Stop   09/08/18 1500  vancomycin (VANCOCIN) IVPB 750  mg/150 ml premix  Status:  Discontinued     750 mg 150 mL/hr over 60 Minutes Intravenous Every 24 hours 09/07/18 1705 09/09/18 1030   09/08/18 0600  ceFEPIme (MAXIPIME) 2 g in sodium chloride 0.9 % 100 mL IVPB     2 g 200 mL/hr over 30 Minutes Intravenous Every 12 hours 09/07/18 1702     09/07/18 1530  ceFEPIme (MAXIPIME) 2 g in sodium chloride 0.9 % 100 mL IVPB     2 g 200 mL/hr over 30 Minutes Intravenous  Once 09/07/18 1519 09/07/18 1638   09/07/18 1530  vancomycin (VANCOCIN) 2,000 mg in sodium chloride 0.9 % 500 mL IVPB     2,000 mg 250 mL/hr over 120 Minutes Intravenous  Once 09/07/18 1519 09/07/18 1837       Objective: Vitals:   09/11/18 1507 09/11/18 1509 09/11/18 2120 09/12/18 0447  BP: 129/72 119/63 125/65 (!) 120/58  Pulse: 80 77 68 87  Resp: _0 Temp: 98.2 F (36.8 C) 97.9 F (36.6 C) 98.3 F (36.8 C) 97.7 F (36.5 C)  TempSrc: Oral Oral  Oral  SpO2: 92% 97% 91% 90%    Intake/Output Summary (Last 24 hours) at 09/12/2018 0725 Last data filed at 09/12/2018 0500 Gross per 24 hour  Intake 480 ml  Output 550 ml  Net -70 ml   There were no vitals filed for this visit.   Physical examination:  General:  Average built, not in obvious distress, on nasal cannula oxygen at 2 L/min. HENT: Normocephalic, pupils equally reacting to light and accommodation.  No scleral pallor or icterus noted. Oral mucosa is moist.  Chest:  Clear breath sounds.  Diminished breath sounds bilaterally. No crackles or wheezes.  CVS: S1 &S2 heard. No murmur.  Regular rate and rhythm. Abdomen: Soft, nontender, nondistended.  Bowel sounds are heard.  Liver is not palpable, no abdominal mass palpated.  Condom catheter in place. Extremities: No cyanosis, clubbing or edema.  Peripheral pulses are palpable.  Bilateral toes with warmth and capillary refill. warmth and capillary refill. Psych: Alert, awake and oriented, normal mood CNS:  No cranial nerve deficits.  Power equal in all extremities.   No sensory deficits noted.  No cerebellar signs.   Skin: Multiple ecchymosis over extremities, petichae over the lower legs   Data Reviewed: I have personally reviewed following labs and imaging studies  CBC: Recent Labs  Lab 09/08/18 0639 09/08/18 2016 09/10/18 0622 09/10/18 1636 09/11/18 0521 09/11/18 1853 09/12/18 0530  WBC 6.7 8.4 8.8 11.5* 8.5  --  10.4  NEUTROABS 4.2 6.6 6.7 9.5* 6.1  --   --   HGB 10.2* 9.8* 9.7* 9.8* 7.7* 8.8* 8.0*  HCT 32.1* 31.1* 30.9* 30.6* 24.9* 28.2* 26.0*  MCV 94.4 95.1 96.0 96.8 95.8  --  97.0  PLT 11* 39* 5* 17* 8*  --  8*   Basic Metabolic Panel: Recent Labs  Lab 09/07/18 1139 09/08/18 0639 09/09/18 0700 09/10/18 0622 09/11/18 0521  NA 143 143 144 147* 147*  K 3.2* 3.7 3.1* 4.0 3.7  CL 101 106 107 113* 114*  CO2 _1 GLUCOSE 132* 116* 121* 121* 145*  BUN 22 54* 61* 51*  61*  CREATININE 1.54* 1.50* 1.45* 1.29* 1.30*  CALCIUM 10.8* 10.0 10.5* 10.4* 10.3  MG  --   --   --  2.2  --    GFR: CrCl cannot be calculated (Unknown ideal weight.). Liver Function Tests: Recent Labs  Lab 09/07/18 1139  AST 112*  ALT 50*  ALKPHOS 140*  BILITOT 1.2  PROT 6.5  ALBUMIN 3.6   No results for input(s): LIPASE, AMYLASE in the last 168 hours. No results for input(s): AMMONIA in the last 168 hours. Coagulation Profile: Recent Labs  Lab 09/07/18 1139 09/10/18 0622  INR 1.0 1.1   Cardiac Enzymes: No results for input(s): CKTOTAL, CKMB, CKMBINDEX, TROPONINI in the last 168 hours. BNP (last 3 results) No results for input(s): PROBNP in the last 8760 hours. HbA1C: No results for input(s): HGBA1C in the last 72 hours. CBG: No results for input(s): GLUCAP in the last 168 hours. Lipid Profile: No results for input(s): CHOL, HDL, LDLCALC, TRIG, CHOLHDL, LDLDIRECT in the last 72 hours. Thyroid Function Tests: No results for input(s): TSH, T4TOTAL, FREET4, T3FREE, THYROIDAB in the last 72 hours. Anemia Panel: No results for  input(s): VITAMINB12, FOLATE, FERRITIN, TIBC, IRON, RETICCTPCT in the last 72 hours. Sepsis Labs: No results for input(s): PROCALCITON, LATICACIDVEN in the last 168 hours.  Recent Results (from the past 240 hour(s))  SARS Coronavirus 2 [facility] order, Performed in [facility] hospital lab) Nasopharyngeal Nasopharyngeal Swab     Status: None   Collection Time: 09/07/18  1:08 PM   Specimen: Nasopharyngeal Swab  Result Value Ref Range Status   SARS Coronavirus 2 NEGATIVE NEGATIVE Final    Comment: (NOTE) If result is NEGATIVE SARS-CoV-2 target nucleic acids are NOT DETECTED. The SARS-CoV-2 RNA is generally detectable in upper and lower  respiratory specimens during the acute phase of infection. The lowest  concentration of SARS-CoV-2 viral copies this assay can detect is 250  copies / mL. A negative result does not preclude SARS-CoV-2 infection  and should not be used as the sole basis for treatment or other  patient management decisions.  A negative result may occur with  improper specimen collection / handling, submission of specimen other  than nasopharyngeal swab, presence of viral mutation(s) within the  areas targeted by this assay, and inadequate number of viral copies  (<250 copies / mL). A negative result must be combined with clinical  observations, patient history, and epidemiological information. If result is POSITIVE SARS-CoV-2 target nucleic acids are DETECTED. The SARS-CoV-2 RNA is generally detectable in upper and lower  respiratory specimens dur ing the acute phase of infection.  Positive  results are indicative of active infection with SARS-CoV-2.  Clinical  correlation with patient history and other diagnostic information is  necessary to determine patient infection status.  Positive results do  not rule out bacterial infection or co-infection with other viruses. If result is PRESUMPTIVE POSTIVE SARS-CoV-2 nucleic acids MAY BE PRESENT.   A presumptive positive  result was obtained on the submitted specimen  and confirmed on repeat testing.  While 2019 novel coronavirus  (SARS-CoV-2) nucleic acids may be present in the submitted sample  additional confirmatory testing may be necessary for epidemiological  and / or clinical management purposes  to differentiate between  SARS-CoV-2 and other Sarbecovirus currently known to infect humans.  If clinically indicated additional testing with an alternate test  methodology [phone]) is advised. The SARS-CoV-2 RNA is generally  detectable in upper and lower respiratory sp ecimens during the acute  phase of infection. The expected result is Negative. Fact Sheet for Patients:  StrictlyIdeas.no Fact Sheet for Healthcare Providers: BankingDealers.co.za This test is not yet approved or cleared by the [place] FDA and has been authorized for detection and/or diagnosis of SARS-CoV-2 by FDA under an Emergency Use Authorization (EUA).  This EUA will remain in effect (meaning this test can be used) for the duration of the COVID-19 declaration under Section 564(b)(1) of the Act, 21 U.S.C. section 360bbb-3(b)(1), unless the authorization is terminated or revoked sooner. Performed at [facility], [place] [address], [place], [place] [address]   Culture, blood (routine x 2)     Status: None (Preliminary result)   Collection Time: 09/07/18  3:50 PM   Specimen: BLOOD LEFT FOREARM  Result Value Ref Range Status   Specimen Description   Final    BLOOD LEFT FOREARM Performed at [place] [address] [place], [place] [address]    Special Requests   Final    BOTTLES DRAWN AEROBIC AND ANAEROBIC Blood Culture adequate volume Performed at [place] [address], [place], [place] [address]    Culture   Final    NO GROWTH 4 DAYS Performed at [facility], [place] [address], [place], [place]  [address]    Report Status PENDING  Incomplete  Culture, blood (routine x 2)     Status: None (Preliminary result)   Collection Time: 09/07/18  3:52 PM   Specimen: BLOOD RIGHT HAND  Result Value Ref Range Status   Specimen Description   Final    BLOOD RIGHT HAND Performed at [place] [address], [place], [place] [address]    Special Requests   Final    BOTTLES DRAWN AEROBIC AND ANAEROBIC Blood Culture adequate volume Performed at [place] [address], [place], [place] [address]    Culture   Final    NO GROWTH 4 DAYS Performed at [facility], [place] [address] [place], [place] [address]    Report Status PENDING  Incomplete  MRSA PCR Screening     Status: None   Collection Time: 09/07/18  6:45 PM   Specimen: Nasal Mucosa; Nasopharyngeal  Result Value Ref Range Status   MRSA by PCR NEGATIVE NEGATIVE Final    Comment:        The GeneXpert MRSA Assay (FDA approved for NASAL specimens only), is one component of a comprehensive MRSA colonization surveillance program. It is not intended to diagnose MRSA infection nor to guide or monitor treatment for MRSA infections. Performed at [facility], [place] [address], [place], [place] [address]       Radiology Studies: Ct Biopsy  Result Date: Sep 12, 2018 INDICATION: Chronic thrombocytopenia EXAM: CT GUIDED RIGHT ILIAC BONE MARROW ASPIRATION AND CORE BIOPSY Date:  09/12/18 09-12-2018 11:11 am Radiologist:  M. Daryll Brod, MD Guidance:  CT FLUOROSCOPY TIME:  Fluoroscopy Time: NONE. MEDICATIONS: 1% lidocaine local ANESTHESIA/SEDATION: 2.0 mg IV Versed; 100 mcg IV Fentanyl Moderate Sedation Time:  10 minutes The patient was continuously monitored during the procedure by the interventional radiology nurse under my direct supervision. CONTRAST:  None. COMPLICATIONS: None PROCEDURE: Informed consent was obtained from the patient following explanation of the procedure, risks,  benefits and alternatives. The patient understands, agrees and consents for the procedure. All questions were addressed. A time out was performed. The patient was positioned prone and non-contrast localization CT was performed of the pelvis to  demonstrate the iliac marrow spaces. Maximal barrier sterile technique utilized including caps, mask, sterile gowns, sterile gloves, large sterile drape, hand hygiene, and Betadine prep. Under sterile conditions and local anesthesia, an 11 gauge coaxial bone biopsy needle was advanced into the right iliac marrow space. Needle position was confirmed with CT imaging. Initially, bone marrow aspiration was performed. Next, the 11 gauge outer cannula was utilized to obtain a right iliac bone marrow core biopsy. Needle was removed. Hemostasis was obtained with compression. The patient tolerated the procedure well. Samples were prepared with the cytotechnologist. No immediate complications. IMPRESSION: CT guided right iliac bone marrow aspiration and core biopsy. Electronically Signed   By: Jerilynn Mages.  Shick M.D.   On: 09/11/2018 11:14   Ct Bone Marrow Biopsy & Aspiration  Result Date: 09/11/2018 INDICATION: Chronic thrombocytopenia EXAM: CT GUIDED RIGHT ILIAC BONE MARROW ASPIRATION AND CORE BIOPSY Date:  09/11/2018 09/11/2018 11:11 am Radiologist:  M. Daryll Brod, MD Guidance:  CT FLUOROSCOPY TIME:  Fluoroscopy Time: NONE. MEDICATIONS: 1% lidocaine local ANESTHESIA/SEDATION: 2.0 mg IV Versed; 100 mcg IV Fentanyl Moderate Sedation Time:  10 minutes The patient was continuously monitored during the procedure by the interventional radiology nurse under my direct supervision. CONTRAST:  None. COMPLICATIONS: None PROCEDURE: Informed consent was obtained from the patient following explanation of the procedure, risks, benefits and alternatives. The patient understands, agrees and consents for the procedure. All questions were addressed. A time out was performed. The patient was positioned prone  and non-contrast localization CT was performed of the pelvis to demonstrate the iliac marrow spaces. Maximal barrier sterile technique utilized including caps, mask, sterile gowns, sterile gloves, large sterile drape, hand hygiene, and Betadine prep. Under sterile conditions and local anesthesia, an 11 gauge coaxial bone biopsy needle was advanced into the right iliac marrow space. Needle position was confirmed with CT imaging. Initially, bone marrow aspiration was performed. Next, the 11 gauge outer cannula was utilized to obtain a right iliac bone marrow core biopsy. Needle was removed. Hemostasis was obtained with compression. The patient tolerated the procedure well. Samples were prepared with the cytotechnologist. No immediate complications. IMPRESSION: CT guided right iliac bone marrow aspiration and core biopsy. Electronically Signed   By: Jerilynn Mages.  Shick M.D.   On: 09/11/2018 11:14     Scheduled Meds:  sodium chloride   Intravenous Once   carvedilol  12.5 mg Oral BID WC   finasteride  5 mg Oral QPM   fluticasone furoate-vilanterol  1 puff Inhalation Daily   And   umeclidinium bromide  1 puff Inhalation Daily   furosemide  40 mg Oral Daily   loratadine  10 mg Oral Daily   losartan  25 mg Oral Daily   predniSONE  40 mg Oral Q breakfast   rosuvastatin  40 mg Oral Daily   Continuous Infusions:  ceFEPime (MAXIPIME) IV 2 g (09/11/18 2151)     LOS: 5 days    Flora Lipps, MD [facility] [email or web address] 09/12/2018, 7:25 AM

## 2018-09-13 DIAGNOSIS — Z515 Encounter for palliative care: Secondary | ICD-10-CM

## 2018-09-13 LAB — COMPREHENSIVE METABOLIC PANEL
ALT: 73 U/L — ABNORMAL HIGH (ref 0–44)
AST: 159 U/L — ABNORMAL HIGH (ref 15–41)
Albumin: 2.9 g/dL — ABNORMAL LOW (ref 3.5–5.0)
Alkaline Phosphatase: 150 U/L — ABNORMAL HIGH (ref 38–126)
Anion gap: 10 (ref 5–15)
BUN: 53 mg/dL — ABNORMAL HIGH (ref 8–23)
CO2: 27 mmol/L (ref 22–32)
Calcium: 10 mg/dL (ref 8.9–10.3)
Chloride: 111 mmol/L (ref 98–111)
Creatinine, Ser: 1.58 mg/dL — ABNORMAL HIGH (ref 0.61–1.24)
GFR calc Af Amer: 46 mL/min — ABNORMAL LOW (ref >60)
GFR calc non Af Amer: 40 mL/min — ABNORMAL LOW (ref >60)
Glucose, Bld: 107 mg/dL — ABNORMAL HIGH (ref 70–99)
Potassium: 3 mmol/L — ABNORMAL LOW (ref 3.5–5.1)
Sodium: 148 mmol/L — ABNORMAL HIGH (ref 135–145)
Total Bilirubin: 2.1 mg/dL — ABNORMAL HIGH (ref 0.3–1.2)
Total Protein: 5.4 g/dL — ABNORMAL LOW (ref 6.5–8.1)

## 2018-09-13 LAB — CBC
HCT: 23.4 % — ABNORMAL LOW (ref 39.0–52.0)
Hemoglobin: 7.6 g/dL — ABNORMAL LOW (ref 13.0–17.0)
MCH: 30.4 pg (ref 26.0–34.0)
MCHC: 32.5 g/dL (ref 30.0–36.0)
MCV: 93.6 fL (ref 80.0–100.0)
Platelets: 9 10*3/uL — CL (ref 150–400)
RBC: 2.5 MIL/uL — ABNORMAL LOW (ref 4.22–5.81)
RDW: 14.6 % (ref 11.5–15.5)
WBC: 11.9 10*3/uL — ABNORMAL HIGH (ref 4.0–10.5)
nRBC: 2.6 % — ABNORMAL HIGH (ref 0.0–0.2)

## 2018-09-13 LAB — BPAM PLATELET PHERESIS
Blood Product Expiration Date: 202008062359
ISSUE DATE / TIME: 202008051246
Unit Type and Rh: 5100

## 2018-09-13 LAB — PREPARE PLATELET PHERESIS: Unit division: 0

## 2018-09-13 MED ORDER — POTASSIUM CHLORIDE CRYS ER 20 MEQ PO TBCR: 40.0000 meq | EXTENDED_RELEASE_TABLET | Freq: Once | ORAL | Status: DC

## 2018-09-13 MED ORDER — LORAZEPAM 0.5 MG PO TABS: 0.5000 mg | ORAL_TABLET | Freq: Four times a day (QID) | ORAL | Status: DC | PRN

## 2018-09-13 MED ORDER — MORPHINE SULFATE (CONCENTRATE) 10 MG/0.5ML PO SOLN: 10.0000 mg | ORAL | Status: DC | PRN

## 2018-09-13 NOTE — Progress Notes (Signed)
IP PROGRESS NOTE  Subjective:   Mr. appears confused, but more comfortable compared to when I saw him yesterday.  Objective: Vital signs in last 24 hours: Blood pressure 134/69, pulse 80, temperature 98.3 F (36.8 C), temperature source Oral, resp. rate 16, SpO2 94 %.  Intake/Output from previous day: 08/02 0701 - 08/03 0700 In: 1172.7 [P.O.:960; IV Piggyback:212.7] Out: 1100 [Urine:1100]  Physical Exam:  Extremities: No leg edema, tender at the lower legs bilaterally Multiple ecchymoses over the extremities Neurologic: Alert, confused, follows simple commands Skin: Bone marrow site without evidence of bleeding    Lab Results: Recent Labs    09/08/18 2016 09/10/18 0622  WBC 8.4 8.8  HGB 9.8* 9.7*  HCT 31.1* 30.9*  PLT 39* 5*    BMET Recent Labs    09/09/18 0700 09/10/18 0622  NA 144 147*  K 3.1* 4.0  CL 107 113*  CO2 26 25  GLUCOSE 121* 121*  BUN 61* 51*  CREATININE 1.45* 1.29*  CALCIUM 10.5* 10.4*    No results found for: CEA1  Studies/Results: No results found.  Medications: I have reviewed the patient's current medications.  Assessment/Plan: 1.Extensive stage small cell lung cancer  CT chest 03/05/2018-left hilar mass, small mediastinal lymph nodes, atelectasis versus consolidation versus mass in the superior segment of the left lower lobe  Bone marrow biopsy 03/07/2018-extensive involvement of the bone marrow with metastatic small cell carcinoma consistent with a lung primary, small foci of non-small cell differentiation  Cycle 1 etoposide/carboplatin 03/09/2018, G-CSF started 03/12/2018 and discontinued 03/20/2018  CT head 03/21/2018-no evidence of metastatic disease  Cycle 2 etoposide/carboplatin, dose escalation of etoposide and carboplatin 04/03/2018  Cycle 3 etoposide/carboplatin with addition of atezolizumab 04/30/2018  Cycle 4 etoposide/carboplatin/atezolizumab 05/28/2018  Restaging chest CT 06/20/2018-left hilar nodal mass no longer  visualized/measurable. Subpleural mass within the superior segment left lower lobe has decreased in size. No new or progressive findings.  Every 3-week atezolizumab beginning 06/25/2018 2. Thrombocytopenia secondary to #1 at presentation 3.COPD 4.History of coronary artery disease 5.CHF 6.BPH 7.Admission with pneumonia December 2019 8.Mild renal insufficiency 9.Elevated liver enzymes 10. History of coagulopathy 11. History of mild elevation of the calcium level, persistent mild elevation likely related to small cell carcinoma  12. Anemia/leukopenia secondary to small cell lung cancer and chemotherapy-improved 13. Hypokalemia-likely secondary to furosemide therapy, potassium supplement started 04/03/2018 14.Admission 04/19/2018 with left lung pneumonia 15.  Admission 09/07/2018 with dyspnea and thrombocytopenia-bone marrow biopsy confirms extensive involvement with metastatic carcinoma 16.  Altered mental status 09/11/2018   Joseph Berg remains confused and agitated.  He appears more comfortable than when I saw him yesterday.  He appears to be a candidate for residential hospice.  I discussed the situation with his wife.  She wants to take him home with hospice care. I will add sublingual Roxanol for pain.   1.  No further platelet transfusion 2.  Morphine and Ativan for comfort 3.  Discontinue prednisone 4.  Hospice consult for home hospice care.   LOS: 3 days   [name], MD   09/10/2018, 2:08 PM

## 2018-09-13 NOTE — Progress Notes (Signed)
[place]:  Referral received from [place] to wife and seh really wants to take the pt home with hospice care. Reviewed the services and confirmed the goals of comfort care. She is in agreement. I am ordering to be delivered from [facility] care equipment of hospital bed, nebulizer, oxygen, bedside commode, and wheelchair. They own in the home already a walker and shower chair. This will be delivered this evening.  Please send pt home with a a script of morphine elixir and ativan for comfort when ready for discharge. Webb Silversmith RN [phone]

## 2018-09-13 NOTE — Care Management Important Message (Signed)
Important Message  Patient Details IM Letter given to Sharren Bridge SW to present to the Patient Name: Joseph Berg MRN: [id number] Date of Birth: 06-Jun-1933   Medicare Important Message Given:  Yes     Kerin Salen 09/13/2018, 10:17 AM

## 2018-09-13 NOTE — Progress Notes (Signed)
PHARMACY NOTE -  Cefepime  Pharmacy has been assisting with dosing of cefepime for PNA.  Dosage remains stable at 2g IV q12 hr and need for further dosage adjustment appears unlikely at present given renal function appropriate for current dose and completing therapy tomorrow  Pharmacy will sign off, following peripherally for culture results or dose adjustments. Please reconsult if a change in clinical status warrants re-evaluation of dosage.  Reuel Boom, PharmD, BCPS [phone] 09/13/2018, 7:50 AM

## 2018-09-13 NOTE — Progress Notes (Signed)
PROGRESS NOTE    [name]  PJS:[id number] DOB: 1933-06-01 DOA: 09/07/2018 PCP: [name], MD     Brief Narrative:  Joseph Berg is a 85 y.o. male with medical history significant of CAD, chronic systolic heart failure, small cell lung cancer followed by Dr. Benay Spice, COPD, BPH who presented to the hospital with chief complaint of shortness of breath.  Patient complained of shortness of breath and his oxygen saturation was in the 80s.  She contacted the cancer center  who recommended the patient be evaluated in the emergency department.  Patient also admitted to productive yellow sputum 2 to 3 days prior to shortness of breath.   In the emergency department, patient was found to be hypoxic with pulse ox in the 80s on room air and improved to mid 90s with 2 L nasal cannula O2.  Labs showed a creatinine 1.54, potassium 3.2, BNP 68, platelets 5.  SARS-CoV-2 negative.  CTA chest negative for PE but did reveal left lower lobe nodular density concerning for mucoid impaction versus pneumonia versus tumor and enlarged left hilar lymph node.  Oncology was consulted for thrombocytopenia.  Assessment & Plan:   Principal Problem:   Thrombocytopenia (HCC) Active Problems:   HYPERTENSION, BENIGN ESSENTIAL   BPH (benign prostatic hyperplasia)   CAD (coronary artery disease) of artery bypass graft   Chronic systolic congestive heart failure (HCC)   Acute hypoxemic respiratory failure (HCC)   Healthcare-associated pneumonia   Small cell lung cancer (HCC)   Hypokalemia   CKD (chronic kidney disease) stage 3, GFR 30-59 ml/min [facility])   Hospice care patient   Palliative care patient Extensive stage small cell lung cancer -Oncology on board.  Poor prognosis.  Considering hospice at this time.  Severe thrombocytopenia  -With petechiae and ecchymosis. Likely etiology due to progression of small cell carcinoma as the bone marrow showed infiltrative carcinoma.   Spoke with Dr.  Learta Codding yesterday and the prognosis at this time is extremely poor due to metastatic small cell cancer infiltrating the bone marrow.  Patient has been consulted and is awaiting for home hospice arrangement.  Further plans for platelet transfusion of blood work.  Acute hypoxemic respiratory failure secondary left lower lobe pneumonia --SARS-CoV-2 negative, and received oxygen nebulizer and prednisone during hospitalization.  He has been weaned off oxygen.  Left lower lobe pneumonia- -no sepsis.  MRSA PCR was negative.  Off vancomycin.  Continue cefepime for now complete a 7-day course in total.   CKD stage III -Baseline creatinine 1.3. Creatinine today 1.5.  Further blood work  Mild hypokalemia.  Will provide 40 mEq of p.o. potassium chloride today..  Chronic systolic heart failure.  Compensated at this time.  BNP of 68.  COPD.  Compensatory at this time.  Essential hypertension Patient was receiving Coreg, Lasix, Cozaar. Due to focus on comfort care, has been discontinued.  Hyperlipidemia Discontinued Crestor  Bilateral toe pain with hypersensitivity.  On local exam is no local ulceration.  Warm on palpation.  Capillary refill present.  Pain control and symptomatic treatment   DVT prophylaxis: SCD  Code Status: DNR  Family Communication: None   Disposition Plan: Plan for hospice at home.  Likely disposition home with hospice arrangement on 09/14/2018.  Spoke with social services and hospice nurse about it.  Will need Ativan and morphine on discharge  Consultants:   Oncology  Interventional radiology  Procedures:   IR guided bone marrow biopsy on 09/11/2018  Antimicrobials:  Anti-infectives (From admission, onward)  Start     Dose/Rate Route Frequency Ordered Stop   09/08/18 1500  vancomycin (VANCOCIN) IVPB 750 mg/150 ml premix  Status:  Discontinued     750 mg 150 mL/hr over 60 Minutes Intravenous Every 24 hours 09/07/18 1705 09/09/18 1030   09/08/18 0600  ceFEPIme  (MAXIPIME) 2 g in sodium chloride 0.9 % 100 mL IVPB     2 g 200 mL/hr over 30 Minutes Intravenous Every 12 hours 09/07/18 1702     09/07/18 1530  ceFEPIme (MAXIPIME) 2 g in sodium chloride 0.9 % 100 mL IVPB     2 g 200 mL/hr over 30 Minutes Intravenous  Once 09/07/18 1519 09/07/18 1638   09/07/18 1530  vancomycin (VANCOCIN) 2,000 mg in sodium chloride 0.9 % 500 mL IVPB     2,000 mg 250 mL/hr over 120 Minutes Intravenous  Once 09/07/18 1519 09/07/18 1837       Objective: Vitals:   09/12/18 1305 09/12/18 1431 09/12/18 2101 09/13/18 0439  BP: (!) 107/53 100/87 (!) 107/52 (!) 131/91  Pulse: 79 87 97 88  Resp: _0 Temp: 98.8 F (37.1 C) 98.4 F (36.9 C) 98.5 F (36.9 C) 98.2 F (36.8 C)  TempSrc: Oral Oral Oral Oral  SpO2: 97% (!) 89% 100% 100%    Intake/Output Summary (Last 24 hours) at 09/13/2018 1139 Last data filed at 09/13/2018 0730 Gross per 24 hour  Intake 8915 ml  Output 800 ml  Net 8115 ml   There were no vitals filed for this visit.   Subjective: Today, patient complains of mild pain but controlled with morphine.  Denies any shortness of breath, chest pain.   Physical examination: General:  Average built, not in obvious distress HENT: Normocephalic, pupils equally reacting to light and accommodation.  No scleral pallor or icterus noted. Oral mucosa is moist.  Chest:   Diminished breath sounds bilaterally. CVS: S1 &S2 heard. No murmur.  Regular rate and rhythm. Abdomen: Soft, tenderness of the upper abdomen without guarding or rigidity, nondistended.  Bowel sounds are heard.  Liver is not palpable, no abdominal mass palpated.  Condom catheter in place. Extremities: No cyanosis, clubbing or edema.  Peripheral pulses are palpable.  Bilateral toes with warmth and capillary refill. warmth and capillary refill. Psych: Alert, awake and communicative.  Appears to be mildly confused.   CNS: Moves all extremities. Skin: Multiple ecchymosis over extremities, petichae  over the lower legs   Data Reviewed: I have personally reviewed following labs and imaging studies  CBC: Recent Labs  Lab 09/08/18 0639 09/08/18 2016 09/10/18 0622 09/10/18 1636 09/11/18 0521 09/11/18 1853 09/12/18 0530 09/13/18 0527  WBC 6.7 8.4 8.8 11.5* 8.5  --  10.4 11.9*  NEUTROABS 4.2 6.6 6.7 9.5* 6.1  --   --   --   HGB 10.2* 9.8* 9.7* 9.8* 7.7* 8.8* 8.0* 7.6*  HCT 32.1* 31.1* 30.9* 30.6* 24.9* 28.2* 26.0* 23.4*  MCV 94.4 95.1 96.0 96.8 95.8  --  97.0 93.6  PLT 11* 39* 5* 17* 8*  --  8* 9*   Basic Metabolic Panel: Recent Labs  Lab 09/08/18 0639 09/09/18 0700 09/10/18 0622 09/11/18 0521 09/13/18 0527  NA 143 144 147* 147* 148*  K 3.7 3.1* 4.0 3.7 3.0*  CL 106 107 113* 114* 111  CO2 _1 GLUCOSE 116* 121* 121* 145* 107*  BUN 54* 61* 51* 61* 53*  CREATININE 1.50* 1.45* 1.29* 1.30* 1.58*  CALCIUM 10.0 10.5* 10.4* 10.3  10.0  MG  --   --  2.2  --   --    GFR: CrCl cannot be calculated (Unknown ideal weight.). Liver Function Tests: Recent Labs  Lab 09/07/18 1139 09/13/18 0527  AST 112* 159*  ALT 50* 73*  ALKPHOS 140* 150*  BILITOT 1.2 2.1*  PROT 6.5 5.4*  ALBUMIN 3.6 2.9*   No results for input(s): LIPASE, AMYLASE in the last 168 hours. No results for input(s): AMMONIA in the last 168 hours. Coagulation Profile: Recent Labs  Lab 09/07/18 1139 09/10/18 0622  INR 1.0 1.1   Cardiac Enzymes: No results for input(s): CKTOTAL, CKMB, CKMBINDEX, TROPONINI in the last 168 hours. BNP (last 3 results) No results for input(s): PROBNP in the last 8760 hours. HbA1C: No results for input(s): HGBA1C in the last 72 hours. CBG: No results for input(s): GLUCAP in the last 168 hours. Lipid Profile: No results for input(s): CHOL, HDL, LDLCALC, TRIG, CHOLHDL, LDLDIRECT in the last 72 hours. Thyroid Function Tests: No results for input(s): TSH, T4TOTAL, FREET4, T3FREE, THYROIDAB in the last 72 hours. Anemia Panel: No results for input(s): VITAMINB12,  FOLATE, FERRITIN, TIBC, IRON, RETICCTPCT in the last 72 hours. Sepsis Labs: No results for input(s): PROCALCITON, LATICACIDVEN in the last 168 hours.  Recent Results (from the past 240 hour(s))  SARS Coronavirus 2 [facility] order, Performed in [facility] hospital lab) Nasopharyngeal Nasopharyngeal Swab     Status: None   Collection Time: 09/07/18  1:08 PM   Specimen: Nasopharyngeal Swab  Result Value Ref Range Status   SARS Coronavirus 2 NEGATIVE NEGATIVE Final    Comment: (NOTE) If result is NEGATIVE SARS-CoV-2 target nucleic acids are NOT DETECTED. The SARS-CoV-2 RNA is generally detectable in upper and lower  respiratory specimens during the acute phase of infection. The lowest  concentration of SARS-CoV-2 viral copies this assay can detect is 250  copies / mL. A negative result does not preclude SARS-CoV-2 infection  and should not be used as the sole basis for treatment or other  patient management decisions.  A negative result may occur with  improper specimen collection / handling, submission of specimen other  than nasopharyngeal swab, presence of viral mutation(s) within the  areas targeted by this assay, and inadequate number of viral copies  (<250 copies / mL). A negative result must be combined with clinical  observations, patient history, and epidemiological information. If result is POSITIVE SARS-CoV-2 target nucleic acids are DETECTED. The SARS-CoV-2 RNA is generally detectable in upper and lower  respiratory specimens dur ing the acute phase of infection.  Positive  results are indicative of active infection with SARS-CoV-2.  Clinical  correlation with patient history and other diagnostic information is  necessary to determine patient infection status.  Positive results do  not rule out bacterial infection or co-infection with other viruses. If result is PRESUMPTIVE POSTIVE SARS-CoV-2 nucleic acids MAY BE PRESENT.   A presumptive positive result was obtained on the  submitted specimen  and confirmed on repeat testing.  While 2019 novel coronavirus  (SARS-CoV-2) nucleic acids may be present in the submitted sample  additional confirmatory testing may be necessary for epidemiological  and / or clinical management purposes  to differentiate between  SARS-CoV-2 and other Sarbecovirus currently known to infect humans.  If clinically indicated additional testing with an alternate test  methodology [phone]) is advised. The SARS-CoV-2 RNA is generally  detectable in upper and lower respiratory sp ecimens during the acute  phase of infection. The expected result  is Negative. Fact Sheet for Patients:  StrictlyIdeas.no Fact Sheet for Healthcare Providers: BankingDealers.co.za This test is not yet approved or cleared by the [place] FDA and has been authorized for detection and/or diagnosis of SARS-CoV-2 by FDA under an Emergency Use Authorization (EUA).  This EUA will remain in effect (meaning this test can be used) for the duration of the COVID-19 declaration under Section 564(b)(1) of the Act, 21 U.S.C. section 360bbb-3(b)(1), unless the authorization is terminated or revoked sooner. Performed at [facility], [place] [address] [place], [place] [address]   Culture, blood (routine x 2)     Status: None   Collection Time: 09/07/18  3:50 PM   Specimen: BLOOD LEFT FOREARM  Result Value Ref Range Status   Specimen Description   Final    BLOOD LEFT FOREARM Performed at [facility] [address], [place], [place] [address]    Special Requests   Final    BOTTLES DRAWN AEROBIC AND ANAEROBIC Blood Culture adequate volume Performed at [facility] [address], [place], [place] [address]    Culture   Final    NO GROWTH 5 DAYS Performed at [facility], [place] [address] [place], [place] [address]    Report Status 09/12/2018 FINAL  Final   Culture, blood (routine x 2)     Status: None   Collection Time: 09/07/18  3:52 PM   Specimen: BLOOD RIGHT HAND  Result Value Ref Range Status   Specimen Description   Final    BLOOD RIGHT HAND Performed at [place] [address] [place], [place] [address]    Special Requests   Final    BOTTLES DRAWN AEROBIC AND ANAEROBIC Blood Culture adequate volume Performed at [facility] [address], [place], [place] [address]    Culture   Final    NO GROWTH 5 DAYS Performed at [facility], [place] [address], [place], [place] [address]    Report Status 09/12/2018 FINAL  Final  MRSA PCR Screening     Status: None   Collection Time: 09/07/18  6:45 PM   Specimen: Nasal Mucosa; Nasopharyngeal  Result Value Ref Range Status   MRSA by PCR NEGATIVE NEGATIVE Final    Comment:        The GeneXpert MRSA Assay (FDA approved for NASAL specimens only), is one component of a comprehensive MRSA colonization surveillance program. It is not intended to diagnose MRSA infection nor to guide or monitor treatment for MRSA infections. Performed at [facility], [place] [address], [place], [place] [address]       Radiology Studies: No results found.   Scheduled Meds: . sodium chloride   Intravenous Once  . fluticasone furoate-vilanterol  1 puff Inhalation Daily   And  . umeclidinium bromide  1 puff Inhalation Daily   Continuous Infusions: . ceFEPime (MAXIPIME) IV 2 g (09/13/18 0950)     LOS: 6 days    Flora Lipps, MD [facility] [email or web address] 09/13/2018, 11:39 AM

## 2018-09-13 NOTE — TOC Initial Note (Addendum)
Transition of Care [facility]) - Initial/Assessment Note    Patient Details  Name: Joseph Berg MRN: [id number] Date of Birth: November 24, 1933  Transition of Care [facility]) CM/SW Contact:    Nila Nephew, LCSW Phone Number: [phone] 09/13/2018, 7:32 AM  Clinical Narrative:   Pt with lung cancer followed by [facility], admitted for thrombocytopenia. Pt/family have discussed prognosis with oncologist Dr. Benay Spice and opted to pursue hospice. Discussed with pt's wife this morning. She reports their wish is to have pt come home however open to residential hospice also if hospice and/or hospital providers feel his symptoms and care will exceed what family can attend to at home.  Pt's wife agreeable to hospice referral today, given agency options, she reports no choice other than to have both home and residential- CSW will check availability.  14:48- pt accepted for home hospice care with [facility]. Pt's wife arranged with hospice to have needed equipment delivered tonight, plan to have pt transport in the a.m. Will arrange PTAR transportation 09/14/2018                  Expected Discharge Plan: (home hospice v residential hospice) Barriers to Discharge: (assessing need)   Patient Goals and CMS Choice Patient states their goals for this hospitalization and ongoing recovery are:: did not participate- wife's goal is "to have him comfortable and with his family" CMS Medicare.gov Compare Post Acute Care list provided to:: Other (Comment Required)(wife) Choice offered to / list presented to : Spouse  Expected Discharge Plan and Services Expected Discharge Plan: (home hospice v residential hospice) In-house Referral: Clinical Social Work Discharge Planning Services: CM Consult Post Acute Care Choice: Hospice Living arrangements for the past 2 months: Single Family Home                                      Prior Living Arrangements/Services Living arrangements for the past 2  months: Single Family Home Lives with:: Spouse Patient language and need for interpreter reviewed:: No Do you feel safe going back to the place where you live?: Yes      Need for Family Participation in Patient Care: Yes (Comment)(wife and sons assisting) Care giver support system in place?: Yes (comment)(wife)   Criminal Activity/Legal Involvement Pertinent to Current Situation/Hospitalization: No - Comment as needed  Activities of Daily Living Home Assistive Devices/Equipment: Cane (specify quad or straight) ADL Screening (condition at time of admission) Patient's cognitive ability adequate to safely complete daily activities?: Yes Is the patient deaf or have difficulty hearing?: No Does the patient have difficulty seeing, even when wearing glasses/contacts?: No Does the patient have difficulty concentrating, remembering, or making decisions?: No Patient able to express need for assistance with ADLs?: Yes Does the patient have difficulty dressing or bathing?: No Independently performs ADLs?: Yes (appropriate for developmental age) Does the patient have difficulty walking or climbing stairs?: No Weakness of Legs: None Weakness of Arms/Hands: None  Permission Sought/Granted Permission sought to share information with : Family Supports    Share Information with NAME: [phone] wife Helene Kelp           Emotional Assessment              Admission diagnosis:  Thrombocytopenia ([place]) [D69.6] Acute on chronic respiratory failure with hypoxia ([place]) [J96.21] Patient Active Problem List   Diagnosis Date Noted  . Hypokalemia 09/07/2018  . CKD (chronic kidney disease) stage  3, GFR 30-59 ml/min (HCC) 09/07/2018  . Port-A-Cath in place 04/27/2018  . Small cell lung cancer ([place]) 04/20/2018  . Goals of care, counseling/discussion 04/20/2018  . Healthcare-associated pneumonia 04/19/2018  . Small cell carcinoma ([place]) 03/09/2018  . Thrombocytopenia ([place]) 03/05/2018  . Acute on chronic  combined systolic and diastolic CHF (congestive heart failure) ([place]) 01/12/2018  . Primary osteoarthritis of right shoulder 01/27/2017  . Trochanteric bursitis of both hips 12/28/2016  . Demand ischemia ([place])   . AKI (acute kidney injury) ([place]) 03/17/2016  . Acute hypoxemic respiratory failure ([place]) 03/17/2016  . Hyperglycemia 03/17/2016  . Postlaminectomy syndrome, lumbar region 12/14/2015  . COPD exacerbation ([place]) 03/27/2015  . HNP (herniated nucleus pulposus), lumbar 02/18/2015  . Claudication of right lower extremity ([place]) 12/09/2014  . Lumbar degenerative disc disease 05/13/2014  . Mixed restrictive and obstructive lung disease ([place]) 01/16/2014  . Cerebrovascular disease 06/26/2013  . AAA (abdominal aortic aneurysm) without rupture ([place]) 06/24/2013  . Aortic calcification ([place]) 06/10/2013  . S/P CABG x 2 08/17/2012  . Cardiomyopathy, ischemic 07/25/2012  . Chronic systolic congestive heart failure ([place]) 07/11/2012  . CAD (coronary artery disease) of artery bypass graft 07/06/2012  . OSA (obstructive sleep apnea) 01/24/2012  . BPH (benign prostatic hyperplasia) 03/02/2011  . History of tobacco abuse 08/23/2010  . Hypoparathyroidism ([place]) 07/30/2010  . SCIATICA 11/10/2009  . INSULIN RESISTANCE SYNDROME 04/16/2007  . HYPERLIPIDEMIA NEC/NOS 11/14/2006  . HYPERTENSION, BENIGN ESSENTIAL 11/14/2006   PCP:  [name], MD Pharmacy:   [facility], [place] [address] [place] [address] Phone: [phone] Fax: [phone]     Social Determinants of Health (SDOH) Interventions    Readmission Risk Interventions No flowsheet data found.

## 2018-09-14 DIAGNOSIS — Z515 Encounter for palliative care: Secondary | ICD-10-CM

## 2018-09-14 MED ORDER — LORAZEPAM 0.5 MG PO TABS: 0.5000 mg | ORAL_TABLET | Freq: Four times a day (QID) | ORAL | 0 refills | Status: AC | PRN

## 2018-09-14 MED ORDER — MORPHINE SULFATE (CONCENTRATE) 10 MG/0.5ML PO SOLN: 10.0000 mg | ORAL | 0 refills | Status: AC | PRN

## 2018-09-14 MED ORDER — MORPHINE SULFATE (CONCENTRATE) 10 MG/0.5ML PO SOLN: 10.0000 mg | ORAL | 0 refills | Status: DC | PRN

## 2018-09-14 MED ORDER — ONDANSETRON HCL 4 MG PO TABS: 4.0000 mg | ORAL_TABLET | Freq: Four times a day (QID) | ORAL | 0 refills | Status: AC | PRN

## 2018-09-14 NOTE — TOC Progression Note (Signed)
Transition of Care [facility]) - Progression Note    Patient Details  Name: MAGIC MOHLER MRN: [id number] Date of Birth: November 18, 1933  Transition of Care [facility]) CM/SW [place], [place] Phone Number: [phone] 09/14/2018, 8:56 AM  Clinical Narrative:   Pt discharging home with [facility] care this morning- equipment was delivered to pt's home last night. Will arrange PTAR transportation home once ready for DC.     Expected Discharge Plan: (home hospice v residential hospice) Barriers to Discharge: (assessing need)  Expected Discharge Plan and Services Expected Discharge Plan: (home hospice v residential hospice) In-house Referral: Clinical Social Work Discharge Planning Services: CM Consult Post Acute Care Choice: Hospice Living arrangements for the past 2 months: Single Family Home                                       Social Determinants of Health (SDOH) Interventions    Readmission Risk Interventions No flowsheet data found.

## 2018-09-14 NOTE — Discharge Summary (Signed)
Physician Discharge Summary  Joseph Berg JDB:[id number] DOB: 1934/01/25 DOA: 09/07/2018  PCP: [name], MD  Admit date: 09/07/2018 Discharge date: 09/14/2018  Admitted From: Home  Discharge disposition: Home with hospice   Recommendations for Outpatient Follow-Up:   Follow up with your hospice provider as scheduled by hospice   Discharge Diagnosis:   Principal Problem:   Thrombocytopenia (HCC) Active Problems:   HYPERTENSION, BENIGN ESSENTIAL   BPH (benign prostatic hyperplasia)   CAD (coronary artery disease) of artery bypass graft   Chronic systolic congestive heart failure (HCC)   Acute hypoxemic respiratory failure (HCC)   Healthcare-associated pneumonia   Small cell lung cancer (HCC)   Hypokalemia   CKD (chronic kidney disease) stage 3, GFR 30-59 ml/min [facility])   Hospice care patient   Palliative care patient   Discharge Condition: Stable  Diet recommendation:   Regular.  Wound care: None.  Code status: DNR/DNI   History of Present Illness:   Joseph Berg is a 85 y.o.malewith medical history significant ofCAD, chronic systolic heart failure, small cell lung cancer followed by Dr.Sherrill,COPD, BPH who presented to the hospital with chief complaint of shortness of breath.  Patient complained of shortness of breath and his oxygen saturation was in the 80s. She contacted the cancer center  who recommended the patient be evaluated in the emergency department.  Patient also admitted to productive yellow sputum 2 to 3 days prior to shortness of breath.  In the emergency department, patient was found to be hypoxic with pulse ox in the 80s on room air and improved to mid 90s with 2 L nasal cannula O2. Labs showed a creatinine 1.54, potassium 3.2, BNP 68, platelets 5. SARS-CoV-2 negative. CTA chest negative for PE but did reveal left lower lobe nodular density concerning for mucoid impaction versus pneumonia versus tumor and enlarged  left hilar lymph node. Oncology was consulted for thrombocytopenia.   Hospital Course:   Patient was admitted to the hospital and following conditions were addressed during hospitalization.  Extensive stage small cell lung cancer -Oncology on board.  Poor prognosis.  Patient will be discharged home today with home hospice  Severe thrombocytopenia  -With petechiae and ecchymosis.  Patient received a platelet transfusion during hospitalization.  Likely etiology of severe thrombocytopenia due to progression of small cell carcinoma as the bone marrow showed infiltrative carcinoma.    Oncology Dr. Learta Codding did indicate extremely  poor prognosis at this time due to metastatic small cell cancer infiltrating the bone marrow.  Hospice was then consulted and family has decided home with hospice on discharge.  Acute hypoxemic respiratory failure secondary left lower lobe pneumonia --SARS-CoV-2 negative. Received oxygen, nebulizer and prednisone during hospitalization.  He has been weaned off oxygen.  Left lower lobe pneumonia- -no sepsis.  MRSA PCR was negative.    Received cefepime during hospitalization.  CKD stage III -Baseline creatinine 1.3.  No further blood work-up due to hospice status.  Mild hypokalemia.    Patient did not wish to take p.o. potassium.  Chronic systolic heart failure.  Compensated at this time.  BNP of 68.  COPD.  Compensated at this time.  Could consider inhalers after discharge.  Essential hypertension Patient was receiving Coreg, Lasix, Cozaar. Due to focus on comfort care, has been discontinued.  Hyperlipidemia Discontinued Crestor  Disposition Plan:  home with hospice today.    Medical Consultants:    Medical oncology   Subjective:   Today, patient denies interval complaints.  Denies overt pain,  fever, chills, shortness of breath  Discharge Exam:   Vitals:   09/14/18 0448 09/14/18 0607  BP: 104/89   Pulse: 94 86  Resp: 20   Temp: 98.2  F (36.8 C)   SpO2: (!) 85% 96%   Vitals:   09/13/18 1335 09/13/18 2031 09/14/18 0448 09/14/18 0607  BP: (!) 104/51 129/80 104/89   Pulse: 70 91 94 86  Resp: 18 20 20    Temp: 98.6 F (37 C) 97.7 F (36.5 C) 98.2 F (36.8 C)   TempSrc: Oral Oral Oral   SpO2: 90% 93% (!) 85% 96%    General exam: Appears calm and comfortable ,Not in distress HEENT:PERRL,Oral mucosa moist Respiratory system: Diminished breath sounds bilaterally.   Cardiovascular system: S1 & S2 heard, RRR.  Gastrointestinal system: Abdomen is nondistended, soft and nontender. No organomegaly or masses felt. Normal bowel sounds heard. Central nervous system: Alert awake and communicative but occasionally confused. Extremities: No edema, no clubbing ,no cyanosis, distal peripheral pulses palpable. Skin: Multiple ecchymosis over the extremities.  Petechiae over the lower extremities. MSK: Normal muscle bulk,tone ,power    Procedures:     IR guided bone marrow biopsy on 09/11/2018  The results of significant diagnostics from this hospitalization (including imaging, microbiology, ancillary and laboratory) are listed below for reference.     Diagnostic Studies:   Dg Chest 2 View  Result Date: 09/07/2018 CLINICAL DATA:  Shortness of breath and weakness EXAM: CHEST - 2 VIEW COMPARISON:  06/20/2018 FINDINGS: Cardiac shadow is stable. Postsurgical changes are again seen and stable. No focal infiltrate or sizable effusion is seen. No acute bony abnormality is noted. PICC line has been removed in the interval. IMPRESSION: No acute abnormality seen. Electronically Signed   By: Inez Catalina M.D.   On: 09/07/2018 10:29   Ct Angio Chest Pe W And/or Wo Contrast  Result Date: 09/07/2018 CLINICAL DATA:  Intermediate probability for acute pulmonary embolus. Shortness of breath EXAM: CT ANGIOGRAPHY CHEST WITH CONTRAST TECHNIQUE: Multidetector CT imaging of the chest was performed using the standard protocol during bolus  administration of intravenous contrast. Multiplanar CT image reconstructions and MIPs were obtained to evaluate the vascular anatomy. CONTRAST:  5m OMNIPAQUE IOHEXOL 350 MG/ML SOLN COMPARISON:  06/20/2018 FINDINGS: Cardiovascular: Normal heart size. Previous median sternotomy and CABG procedure. Aortic atherosclerosis. Main pulmonary artery is patent. No lobar or segmental pulmonary artery filling defects identified. Mediastinum/Nodes: Normal appearance of the thyroid gland. Small amount of aspirated debris is identified along the left lateral wall of the trachea. Normal appearance of the esophagus. No mediastinal adenopathy. New left hilar lymph node measures 1.4 cm, image 49/4. Lungs/Pleura: No pleural effusion. Moderate changes of emphysema. Central branching nodular density within the superior segment of left upper lobe is new when compared with the previous exam measuring 2.5 by 2.7 cm. Subpleural area of consolidation with within the posterior aspect of the superior segment of left lower lobe measures 2.5 x 1.7 cm, new from previous exam. Upper Abdomen: No acute abnormality. The contour the liver is irregular and there is hypertrophy of the lateral segment of left lobe of liver. Early cirrhosis cannot be excluded. Musculoskeletal: No chest wall abnormality. No acute or significant osseous findings. Review of the MIP images confirms the above findings. IMPRESSION: 1. No findings of acute pulmonary embolus. 2. There is a new central branching nodular density within the superior segment of left lower lobe. Adjacent nodule is identified within the posterior superior segment of left upper lobe measuring 2.7 cm.  Also new from previous exam. In the acute setting, findings are favored to represent an area of mucoid impaction and possible pneumonia. Differential considerations include primary bronchogenic carcinoma with endobronchial spread of tumor. Short-term interval follow-up imaging is recommended to ensure  resolution and to rule out bronchogenic carcinoma in this patient who is at increased risk. 3. Enlarged left hilar lymph node. Also new. In the setting of pneumonia this may represent reactive adenopathy. Attention on short-term follow-up examination is recommended. 4. Debris is identified along the left lateral wall of the trachea. 5. Aortic Atherosclerosis (ICD10-I70.0) and Emphysema (ICD10-J43.9). 6. Morphologic features of the liver suggestive of early cirrhosis. Electronically Signed   By: Kerby Moors M.D.   On: 09/07/2018 14:54    Labs:   Basic Metabolic Panel: Recent Labs  Lab 09/08/18 0639 09/09/18 0700 09/10/18 0622 09/11/18 0521 09/13/18 0527  NA 143 144 147* 147* 148*  K 3.7 3.1* 4.0 3.7 3.0*  CL 106 107 113* 114* 111  CO2 28 26 25 25 27   GLUCOSE 116* 121* 121* 145* 107*  BUN 54* 61* 51* 61* 53*  CREATININE 1.50* 1.45* 1.29* 1.30* 1.58*  CALCIUM 10.0 10.5* 10.4* 10.3 10.0  MG  --   --  2.2  --   --    GFR CrCl cannot be calculated (Unknown ideal weight.). Liver Function Tests: Recent Labs  Lab 09/07/18 1139 09/13/18 0527  AST 112* 159*  ALT 50* 73*  ALKPHOS 140* 150*  BILITOT 1.2 2.1*  PROT 6.5 5.4*  ALBUMIN 3.6 2.9*   No results for input(s): LIPASE, AMYLASE in the last 168 hours. No results for input(s): AMMONIA in the last 168 hours. Coagulation profile Recent Labs  Lab 09/07/18 1139 09/10/18 0622  INR 1.0 1.1    CBC: Recent Labs  Lab 09/08/18 0639 09/08/18 2016 09/10/18 0622 09/10/18 1636 09/11/18 0521 09/11/18 1853 09/12/18 0530 09/13/18 0527  WBC 6.7 8.4 8.8 11.5* 8.5  --  10.4 11.9*  NEUTROABS 4.2 6.6 6.7 9.5* 6.1  --   --   --   HGB 10.2* 9.8* 9.7* 9.8* 7.7* 8.8* 8.0* 7.6*  HCT 32.1* 31.1* 30.9* 30.6* 24.9* 28.2* 26.0* 23.4*  MCV 94.4 95.1 96.0 96.8 95.8  --  97.0 93.6  PLT 11* 39* 5* 17* 8*  --  8* 9*   Cardiac Enzymes: No results for input(s): CKTOTAL, CKMB, CKMBINDEX, TROPONINI in the last 168 hours. BNP: Invalid input(s):  POCBNP CBG: No results for input(s): GLUCAP in the last 168 hours. D-Dimer No results for input(s): DDIMER in the last 72 hours. Hgb A1c No results for input(s): HGBA1C in the last 72 hours. Lipid Profile No results for input(s): CHOL, HDL, LDLCALC, TRIG, CHOLHDL, LDLDIRECT in the last 72 hours. Thyroid function studies No results for input(s): TSH, T4TOTAL, T3FREE, THYROIDAB in the last 72 hours.  Invalid input(s): FREET3 Anemia work up No results for input(s): VITAMINB12, FOLATE, FERRITIN, TIBC, IRON, RETICCTPCT in the last 72 hours. Microbiology Recent Results (from the past 240 hour(s))  SARS Coronavirus 2 [facility] order, Performed in [facility] hospital lab) Nasopharyngeal Nasopharyngeal Swab     Status: None   Collection Time: 09/07/18  1:08 PM   Specimen: Nasopharyngeal Swab  Result Value Ref Range Status   SARS Coronavirus 2 NEGATIVE NEGATIVE Final    Comment: (NOTE) If result is NEGATIVE SARS-CoV-2 target nucleic acids are NOT DETECTED. The SARS-CoV-2 RNA is generally detectable in upper and lower  respiratory specimens during the acute phase of infection. The  lowest  concentration of SARS-CoV-2 viral copies this assay can detect is 250  copies / mL. A negative result does not preclude SARS-CoV-2 infection  and should not be used as the sole basis for treatment or other  patient management decisions.  A negative result may occur with  improper specimen collection / handling, submission of specimen other  than nasopharyngeal swab, presence of viral mutation(s) within the  areas targeted by this assay, and inadequate number of viral copies  (<250 copies / mL). A negative result must be combined with clinical  observations, patient history, and epidemiological information. If result is POSITIVE SARS-CoV-2 target nucleic acids are DETECTED. The SARS-CoV-2 RNA is generally detectable in upper and lower  respiratory specimens dur ing the acute phase of infection.   Positive  results are indicative of active infection with SARS-CoV-2.  Clinical  correlation with patient history and other diagnostic information is  necessary to determine patient infection status.  Positive results do  not rule out bacterial infection or co-infection with other viruses. If result is PRESUMPTIVE POSTIVE SARS-CoV-2 nucleic acids MAY BE PRESENT.   A presumptive positive result was obtained on the submitted specimen  and confirmed on repeat testing.  While 2019 novel coronavirus  (SARS-CoV-2) nucleic acids may be present in the submitted sample  additional confirmatory testing may be necessary for epidemiological  and / or clinical management purposes  to differentiate between  SARS-CoV-2 and other Sarbecovirus currently known to infect humans.  If clinically indicated additional testing with an alternate test  methodology [phone]) is advised. The SARS-CoV-2 RNA is generally  detectable in upper and lower respiratory sp ecimens during the acute  phase of infection. The expected result is Negative. Fact Sheet for Patients:  StrictlyIdeas.no Fact Sheet for Healthcare Providers: BankingDealers.co.za This test is not yet approved or cleared by the [place] FDA and has been authorized for detection and/or diagnosis of SARS-CoV-2 by FDA under an Emergency Use Authorization (EUA).  This EUA will remain in effect (meaning this test can be used) for the duration of the COVID-19 declaration under Section 564(b)(1) of the Act, 21 U.S.C. section 360bbb-3(b)(1), unless the authorization is terminated or revoked sooner. Performed at [facility], [place] [address], [place], [place] [address]   Culture, blood (routine x 2)     Status: None   Collection Time: 09/07/18  3:50 PM   Specimen: BLOOD LEFT FOREARM  Result Value Ref Range Status   Specimen Description   Final    BLOOD LEFT FOREARM Performed at [facility] [address] [place], [place] [address]    Special Requests   Final    BOTTLES DRAWN AEROBIC AND ANAEROBIC Blood Culture adequate volume Performed at [facility] [address] [place], [place] [address]    Culture   Final    NO GROWTH 5 DAYS Performed at [facility], [place] [address] [place], [place] [address]    Report Status 09/12/2018 FINAL  Final  Culture, blood (routine x 2)     Status: None   Collection Time: 09/07/18  3:52 PM   Specimen: BLOOD RIGHT HAND  Result Value Ref Range Status   Specimen Description   Final    BLOOD RIGHT HAND Performed at [facility] [address], [place], [place] [address]    Special Requests   Final    BOTTLES DRAWN AEROBIC AND ANAEROBIC Blood Culture adequate volume Performed at [facility], [place] [address], [place], [place] [address]    Culture   Final    NO GROWTH 5 DAYS Performed at [facility], [place] [address], [place], [place] [address]    Report Status 09/12/2018 FINAL  Final  MRSA PCR Screening     Status: None   Collection Time: 09/07/18  6:45 PM   Specimen: Nasal Mucosa; Nasopharyngeal  Result Value Ref Range Status   MRSA by PCR NEGATIVE NEGATIVE Final    Comment:        The GeneXpert MRSA Assay (FDA approved for NASAL specimens only), is one component of a comprehensive MRSA colonization surveillance program. It is not intended to diagnose MRSA infection nor to guide or monitor treatment for MRSA infections. Performed at [facility], [place] [address], [place], [place] [address]      Discharge Instructions:   Discharge Instructions    Diet general   Complete by: As directed    Discharge instructions   Complete by: As directed    Follow up as per hospice   Increase activity slowly   Complete by: As directed      Allergies as of 09/14/2018      Reactions   Hydrochlorothiazide Other (See  Comments)   Affected renal function.  "affected kidneys and calcium level"      Medication List    STOP taking these medications   Aspirin Buf(CaCarb-MgCarb-MgO) 81 MG Tabs   calcium-vitamin D 500-200 MG-UNIT tablet Commonly known as: Oscal 500/200 D-3   cholecalciferol 25 MCG (1000 UT) tablet Commonly known as: VITAMIN D3   cilostazol 100 MG tablet Commonly known as: PLETAL   diphenhydramine-acetaminophen 25-500 MG Tabs tablet Commonly known as: TYLENOL PM   finasteride 5 MG tablet Commonly known as: PROSCAR   furosemide 40 MG tablet Commonly known as: LASIX   losartan 25 MG tablet Commonly known as: COZAAR   multivitamin capsule   rosuvastatin 40 MG tablet Commonly known as: CRESTOR   TRUBIOTICS PO   VITAMIN B-12 PO   vitamin C 250 MG tablet Commonly known as: ASCORBIC ACID     TAKE these medications   AMBULATORY NON FORMULARY MEDICATION Nebulizer - use as needed - with supplies.  Diagnosis of COPD J44.1   carvedilol 12.5 MG tablet Commonly known as: COREG Take 1 tablet (12.5 mg total) by mouth 2 (two) times daily with a meal.   fexofenadine 180 MG tablet Commonly known as: ALLEGRA Take 180 mg by mouth daily as needed for allergies or rhinitis.   Fluticasone-Umeclidin-Vilant 100-62.5-25 MCG/INH Aepb Commonly known as: Trelegy Ellipta Inhale 1 Inhaler into the lungs daily.   loperamide 2 MG tablet Commonly known as: IMODIUM A-D Take 2 mg by mouth 4 (four) times daily as needed for diarrhea or loose stools.   LORazepam 0.5 MG tablet Commonly known as: ATIVAN Place 1 tablet (0.5 mg total) under the tongue every 6 (six) hours as needed for anxiety.   morphine CONCENTRATE 10 MG/0.5ML Soln concentrated solution Place 0.5 mLs (10 mg total) under the tongue every 2 (two) hours as needed for up to 5 days for moderate pain or shortness of breath.   ondansetron 4 MG tablet Commonly known as: ZOFRAN Take 1 tablet (4 mg total) by mouth every 6 (six) hours  as needed for nausea.   Ventolin HFA 108 (90 Base) MCG/ACT inhaler Generic drug: albuterol TAKE 2 PUFFS BY MOUTH EVERY 6 HOURS AS NEEDED FOR WHEEZE OR SHORTNESS OF BREATH What changed:  See the new instructions.       Time coordinating discharge: 39 minutes  Signed:  Zhania Shaheen  [facility] 09/14/2018, 9:09 AM

## 2018-09-17 ENCOUNTER — Encounter (HOSPITAL_COMMUNITY): Payer: Self-pay | Admitting: Oncology

## 2018-09-17 ENCOUNTER — Ambulatory Visit: Payer: Medicare PPO | Admitting: Nurse Practitioner

## 2018-09-17 ENCOUNTER — Other Ambulatory Visit: Payer: Medicare PPO

## 2018-09-17 ENCOUNTER — Ambulatory Visit: Payer: Medicare PPO

## 2018-09-18 ENCOUNTER — Telehealth: Payer: Self-pay | Admitting: Family Medicine

## 2018-09-18 NOTE — Telephone Encounter (Signed)
Wife called in to cancel appointment for patient. Also to inform us that he passed away on Apr 30, 2022 and let Dr.Metheney know.

## 2018-09-20 ENCOUNTER — Ambulatory Visit: Payer: Medicare PPO | Admitting: Family Medicine

## 2018-10-09 DEATH — deceased

## 2020-10-27 IMAGING — DX DG CHEST 2V
2 series · 2 of 2 positions shown · non-contrast
Comparison: None.

CLINICAL DATA: Short of breath.  Recent pneumonia

EXAM:
CHEST - 2 VIEW

[chest pa]
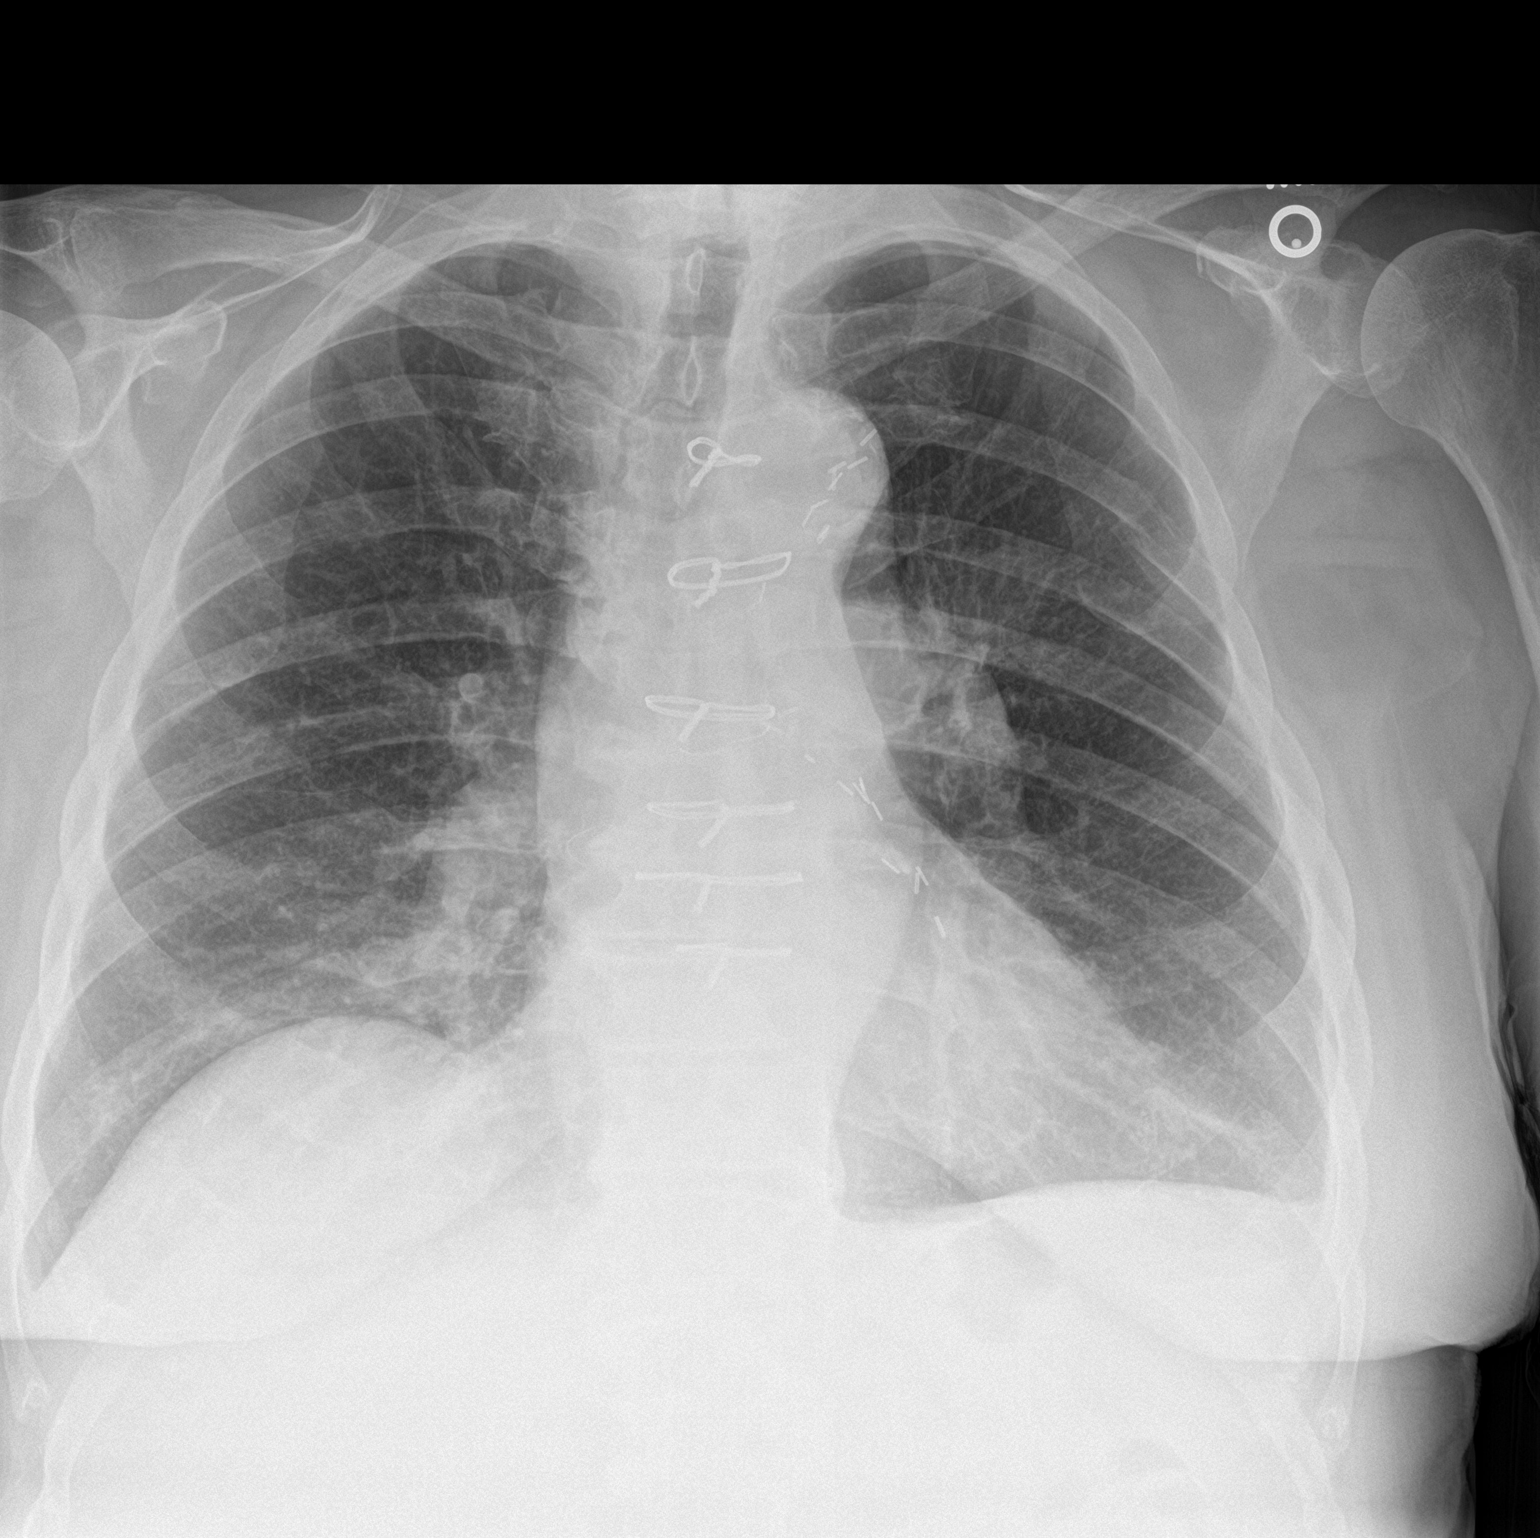

[chest lat]
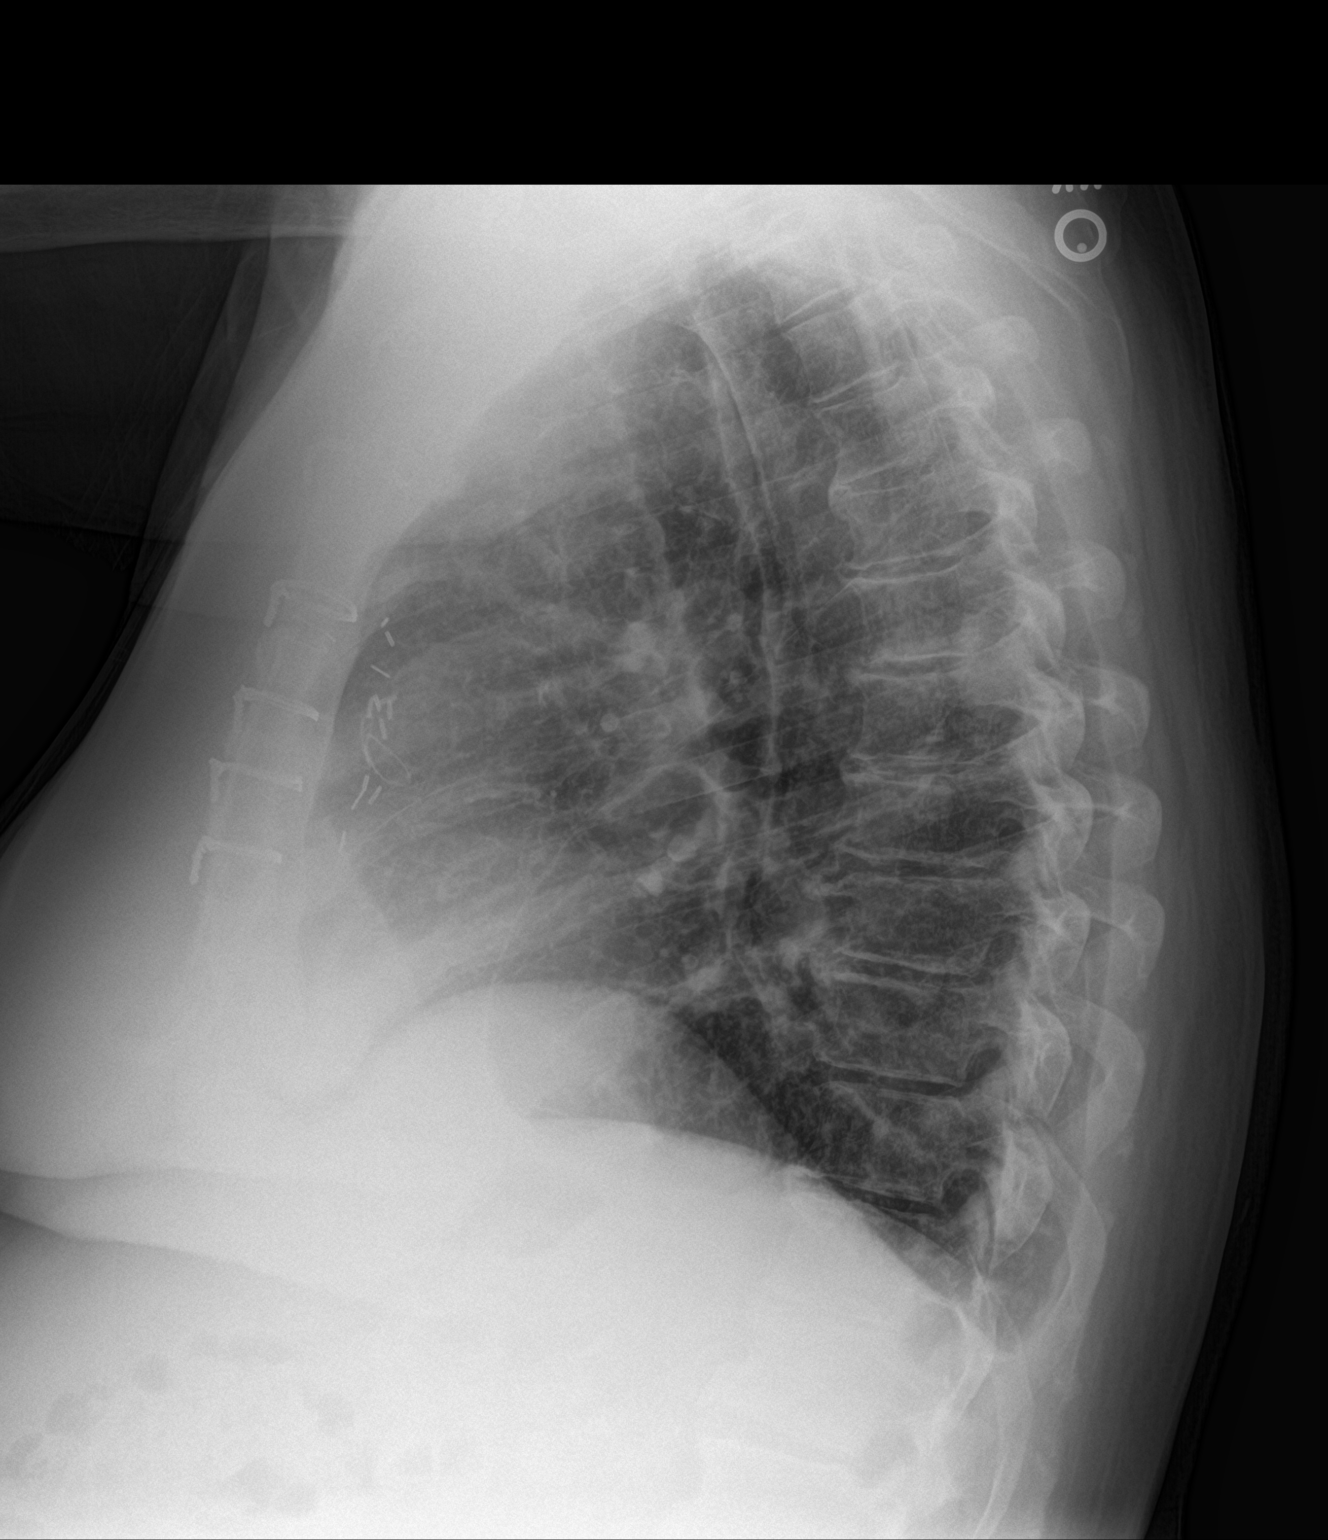

[2 of 2 positions shown; findings below may reference images not displayed]

FINDINGS: Sternotomy wires overlie normal cardiac silhouette. Normal pulmonary
vasculature. No effusion, infiltrate, or pneumothorax. No acute
osseous abnormality.
IMPRESSION: No acute cardiopulmonary process.

## 2020-11-12 IMAGING — CT CT HEAD WO/W CM
3 of 4 series · 16 of 47 positions shown, 19 images · IV contrast (omnipaque)
Comparison: None.

CLINICAL DATA: Staging lung

EXAM:
CT HEAD WITHOUT AND WITH CONTRAST
TECHNIQUE: Contiguous axial images were obtained from the base of the skull
through the vertex without and with intravenous contrast
CONTRAST:  75mL OMNIPAQUE IOHEXOL 300 MG/ML  SOLN

[Series 2: head wo · axial · 0.51mm/px · z∈[-111,+29]mm · 10 of 34 slices shown, 13 images]
[im 3/34  brain]
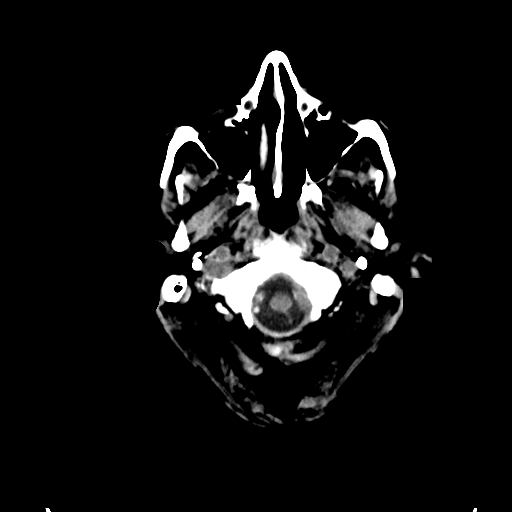
[im 3/34  bone]
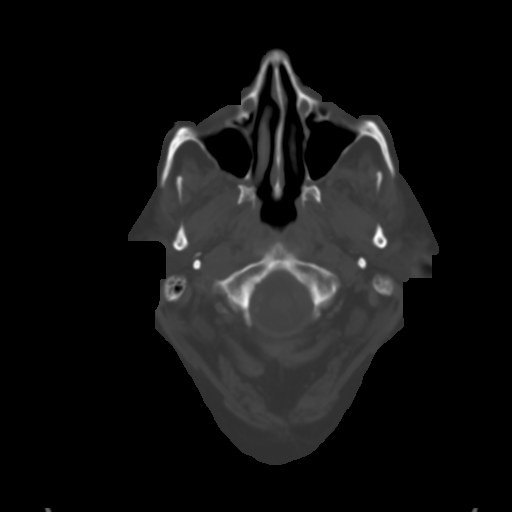
[im 5/34  brain]
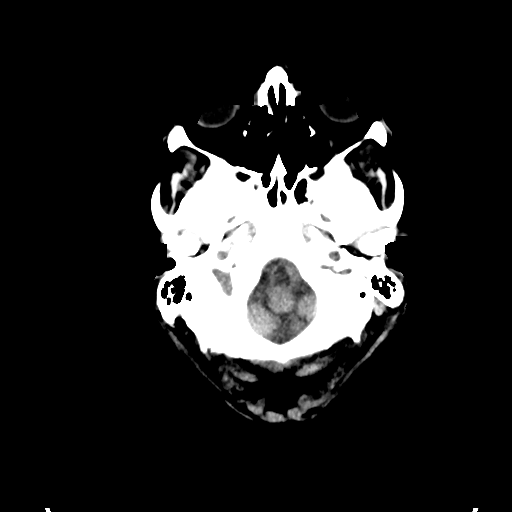
[im 10/34  brain]
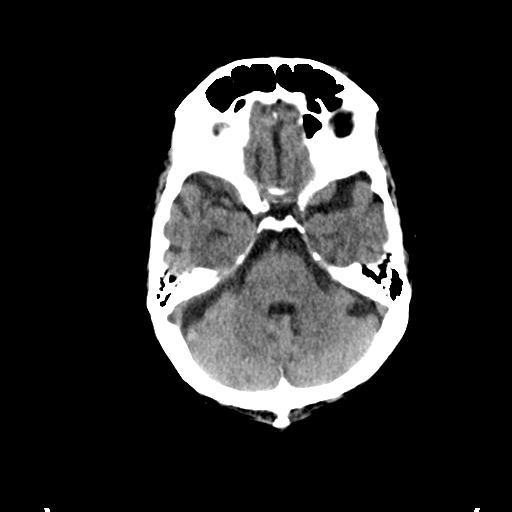
[im 12/34  brain]
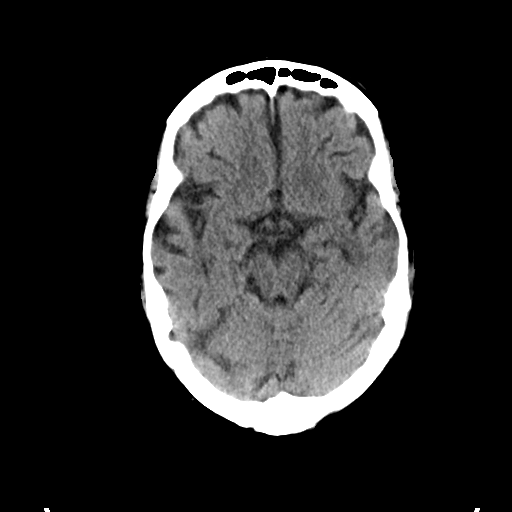
[im 15/34  brain]
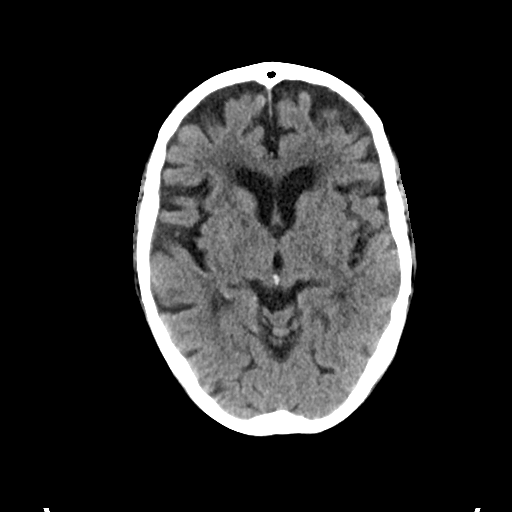
[im 15/34  bone]
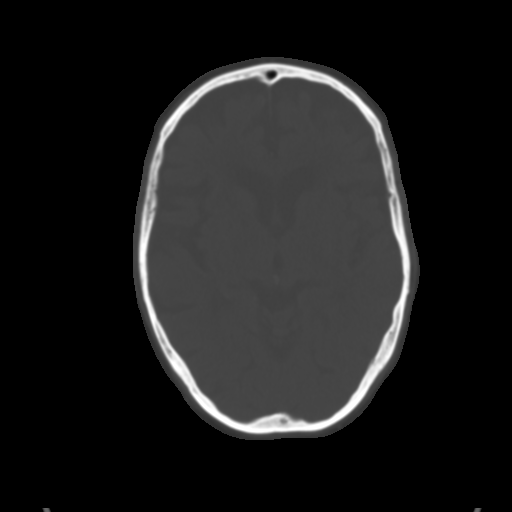
[im 19/34  brain]
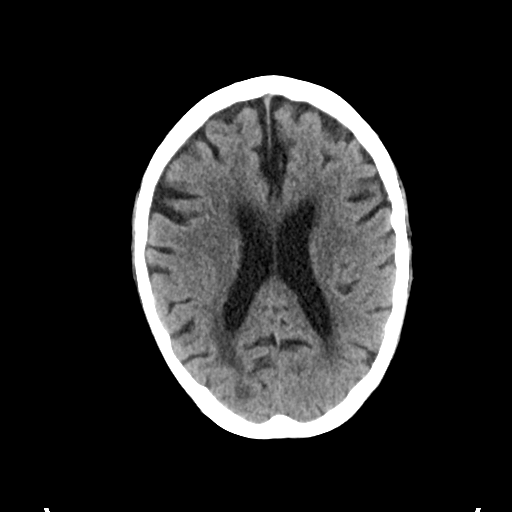
[im 22/34  brain]
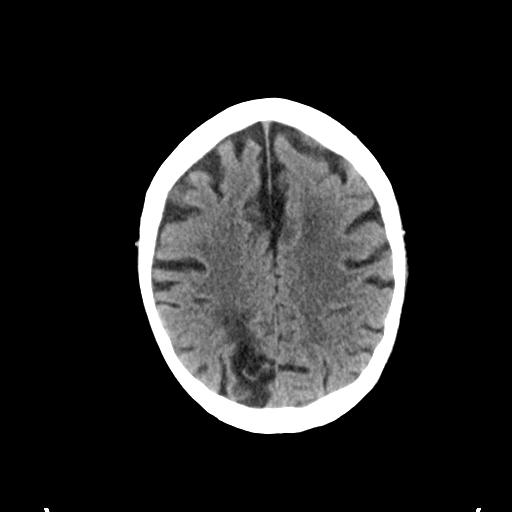
[im 24/34  brain]
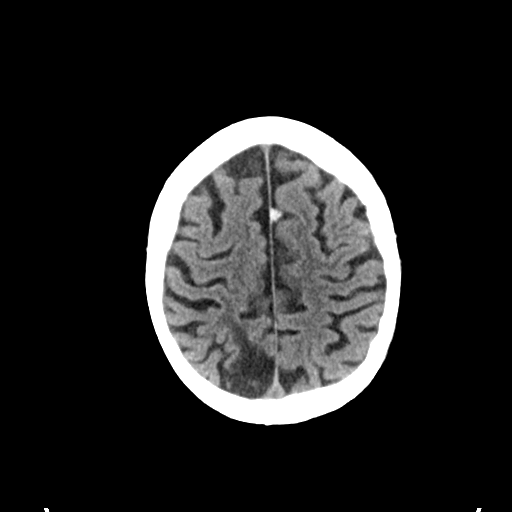
[im 29/34  brain]
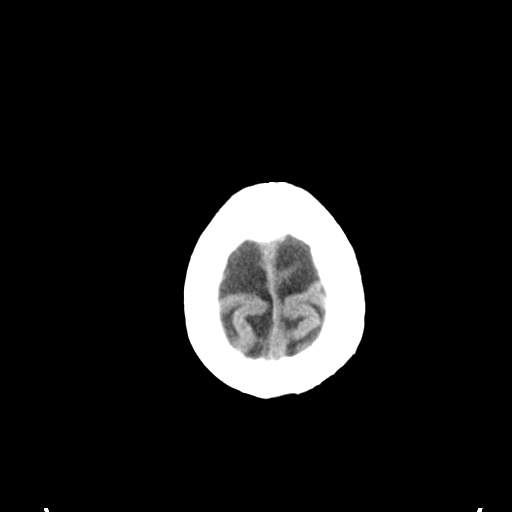
[im 29/34  bone]
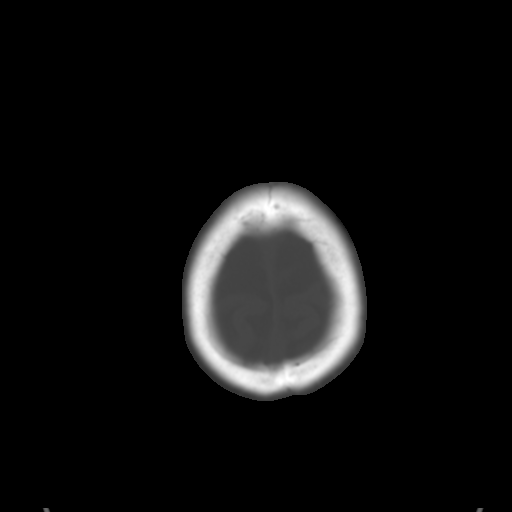
[im 31/34  brain]
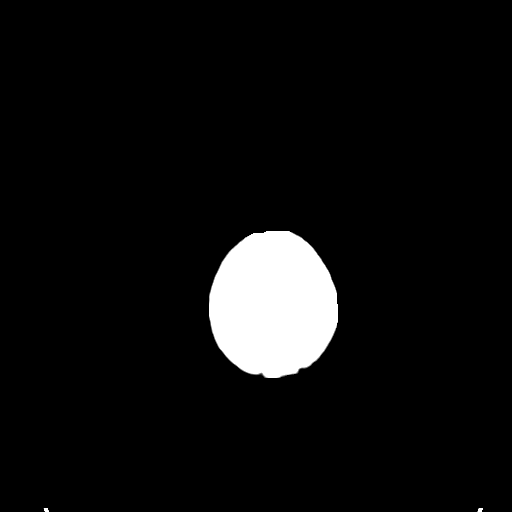

[Series 5: coronal soft tissue · coronal · 0.33mm/px · 3 of 75 slices shown]
[im 25/75  brain]
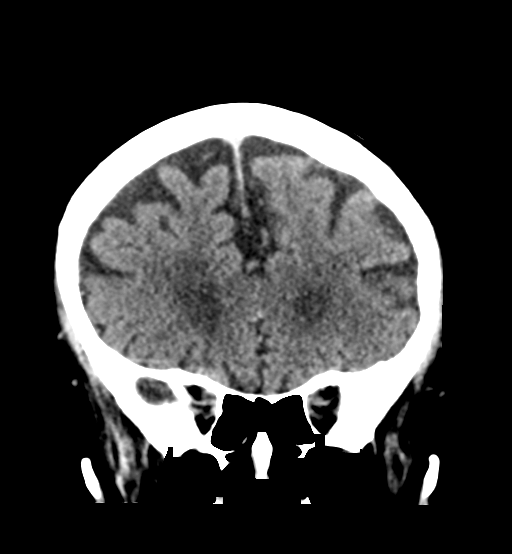
[im 33/75  brain]
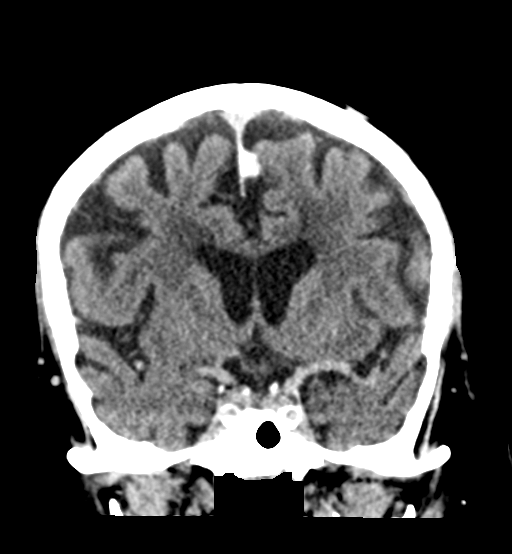
[im 42/75  brain]
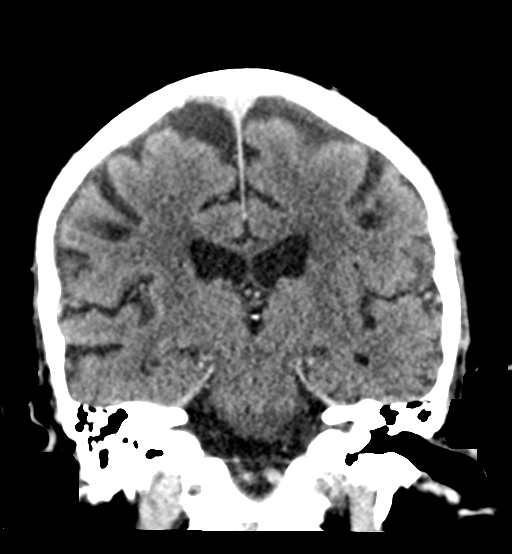

[Series 6: sagittal soft tissue · sagittal · 0.37mm/px · 3 of 67 slices shown]
[im 23/67  brain]
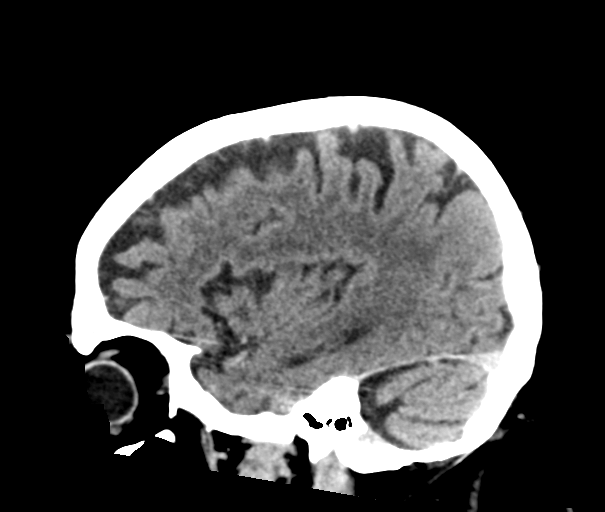
[im 34/67  brain]
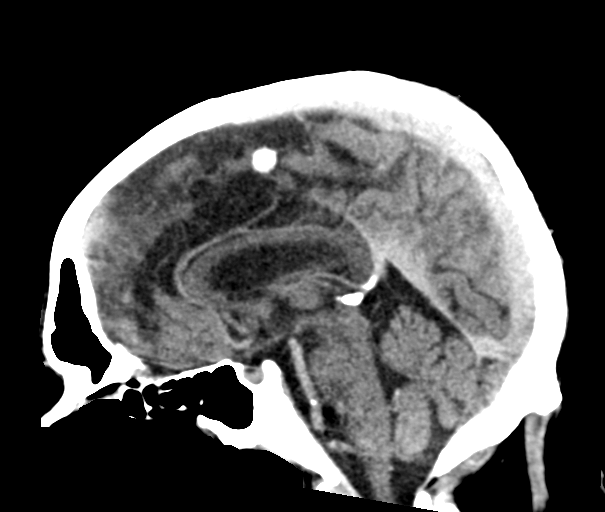
[im 45/67  brain]
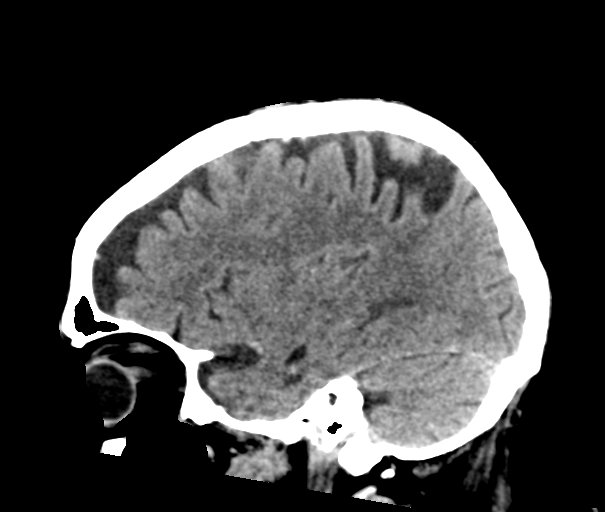

[16 of 47 positions shown; findings below may reference images not displayed]

FINDINGS: Brain: Remote appearing infarct in the parasagittal right parietal
and occipital lobes, overall moderate volume. Small-vessel ischemic
change in the cerebral white matter. Left para median falcine
calcification without detected superimposed enhancement. This could
be a calcified meningioma or nodular dural calcification. No
evidence of metastatic disease.

Vascular: Atherosclerotic calcification.

Skull: No evident bony metastasis.

Sinuses/Orbits: Negative
IMPRESSION: 1. No evidence of metastatic disease.
2. Remote right occipital parietal infarct.
